# Patient Record
Sex: Female | Born: 1940 | Race: White | Hispanic: No | Smoking: Never smoker
Health system: Southern US, Community
[De-identification: ages and names within clinical notes are randomized; demographics above are authoritative.]

## PROBLEM LIST (undated history)

## (undated) DIAGNOSIS — K219 Gastro-esophageal reflux disease without esophagitis: Secondary | ICD-10-CM

## (undated) DIAGNOSIS — C801 Malignant (primary) neoplasm, unspecified: Secondary | ICD-10-CM

## (undated) DIAGNOSIS — R32 Unspecified urinary incontinence: Secondary | ICD-10-CM

## (undated) DIAGNOSIS — I10 Essential (primary) hypertension: Secondary | ICD-10-CM

## (undated) DIAGNOSIS — E039 Hypothyroidism, unspecified: Secondary | ICD-10-CM

## (undated) DIAGNOSIS — N189 Chronic kidney disease, unspecified: Secondary | ICD-10-CM

## (undated) DIAGNOSIS — K5900 Constipation, unspecified: Secondary | ICD-10-CM

## (undated) DIAGNOSIS — M1990 Unspecified osteoarthritis, unspecified site: Secondary | ICD-10-CM

## (undated) DIAGNOSIS — M810 Age-related osteoporosis without current pathological fracture: Secondary | ICD-10-CM

## (undated) DIAGNOSIS — F419 Anxiety disorder, unspecified: Secondary | ICD-10-CM

## (undated) DIAGNOSIS — G4730 Sleep apnea, unspecified: Secondary | ICD-10-CM

## (undated) HISTORY — PX: JOINT REPLACEMENT: SHX530

## (undated) HISTORY — PX: REPLACEMENT TOTAL KNEE: SUR1224

## (undated) HISTORY — PX: CORRECTION HAMMER TOE: SUR317

## (undated) HISTORY — PX: CATARACT EXTRACTION, BILATERAL: SHX1313

## (undated) HISTORY — PX: BREAST BIOPSY: SHX20

## (undated) HISTORY — PX: BREAST EXCISIONAL BIOPSY: SUR124

## (undated) HISTORY — PX: SKIN BIOPSY: SHX1

---

## 2004-08-24 ENCOUNTER — Encounter: Payer: Self-pay | Admitting: Rheumatology

## 2004-12-30 ENCOUNTER — Other Ambulatory Visit: Payer: Self-pay

## 2004-12-30 ENCOUNTER — Inpatient Hospital Stay: Payer: Self-pay | Admitting: Internal Medicine

## 2005-03-05 ENCOUNTER — Inpatient Hospital Stay: Payer: Self-pay | Admitting: Internal Medicine

## 2005-04-10 ENCOUNTER — Ambulatory Visit: Payer: Self-pay | Admitting: Internal Medicine

## 2005-12-14 ENCOUNTER — Emergency Department: Payer: Self-pay | Admitting: Emergency Medicine

## 2005-12-14 ENCOUNTER — Emergency Department: Payer: Self-pay | Admitting: Internal Medicine

## 2005-12-14 ENCOUNTER — Other Ambulatory Visit: Payer: Self-pay

## 2005-12-15 ENCOUNTER — Emergency Department: Payer: Self-pay | Admitting: Internal Medicine

## 2006-05-12 ENCOUNTER — Emergency Department: Payer: Self-pay | Admitting: Emergency Medicine

## 2006-05-12 ENCOUNTER — Other Ambulatory Visit: Payer: Self-pay

## 2006-07-07 ENCOUNTER — Ambulatory Visit: Payer: Self-pay | Admitting: Internal Medicine

## 2006-08-07 ENCOUNTER — Ambulatory Visit: Payer: Self-pay | Admitting: Surgery

## 2007-02-17 ENCOUNTER — Other Ambulatory Visit: Payer: Self-pay

## 2007-02-17 ENCOUNTER — Inpatient Hospital Stay: Payer: Self-pay | Admitting: Internal Medicine

## 2007-03-08 ENCOUNTER — Ambulatory Visit: Payer: Self-pay | Admitting: Internal Medicine

## 2007-03-31 ENCOUNTER — Other Ambulatory Visit: Payer: Self-pay

## 2007-03-31 ENCOUNTER — Emergency Department: Payer: Self-pay | Admitting: Emergency Medicine

## 2008-01-19 ENCOUNTER — Ambulatory Visit: Payer: Self-pay | Admitting: Internal Medicine

## 2008-02-20 ENCOUNTER — Emergency Department: Payer: Self-pay | Admitting: Emergency Medicine

## 2008-12-26 ENCOUNTER — Ambulatory Visit: Payer: Self-pay | Admitting: Rheumatology

## 2009-01-11 ENCOUNTER — Emergency Department: Payer: Self-pay | Admitting: Emergency Medicine

## 2009-02-14 ENCOUNTER — Ambulatory Visit: Payer: Self-pay | Admitting: Internal Medicine

## 2009-08-17 ENCOUNTER — Ambulatory Visit: Payer: Self-pay | Admitting: Surgery

## 2009-09-03 ENCOUNTER — Ambulatory Visit: Payer: Self-pay | Admitting: Surgery

## 2009-10-19 ENCOUNTER — Ambulatory Visit: Payer: Self-pay | Admitting: Surgery

## 2009-10-26 ENCOUNTER — Ambulatory Visit: Payer: Self-pay | Admitting: Surgery

## 2010-01-15 ENCOUNTER — Emergency Department: Payer: Self-pay | Admitting: Emergency Medicine

## 2010-02-22 ENCOUNTER — Ambulatory Visit: Payer: Self-pay | Admitting: Internal Medicine

## 2010-07-27 ENCOUNTER — Emergency Department: Payer: Self-pay | Admitting: Emergency Medicine

## 2010-08-05 ENCOUNTER — Emergency Department: Payer: Self-pay | Admitting: Internal Medicine

## 2010-10-03 ENCOUNTER — Ambulatory Visit: Payer: Self-pay | Admitting: Internal Medicine

## 2011-08-26 ENCOUNTER — Ambulatory Visit: Payer: Self-pay | Admitting: Otolaryngology

## 2011-11-03 ENCOUNTER — Ambulatory Visit: Payer: Self-pay | Admitting: Internal Medicine

## 2012-08-12 ENCOUNTER — Observation Stay: Payer: Self-pay | Admitting: Internal Medicine

## 2012-08-12 LAB — COMPREHENSIVE METABOLIC PANEL
Albumin: 3.5 g/dL (ref 3.4–5.0)
Alkaline Phosphatase: 78 U/L (ref 50–136)
Anion Gap: 6 — ABNORMAL LOW (ref 7–16)
BUN: 14 mg/dL (ref 7–18)
Bilirubin,Total: 0.3 mg/dL (ref 0.2–1.0)
Calcium, Total: 9.9 mg/dL (ref 8.5–10.1)
Chloride: 103 mmol/L (ref 98–107)
Co2: 32 mmol/L (ref 21–32)
Creatinine: 0.88 mg/dL (ref 0.60–1.30)
EGFR (African American): >60
EGFR (Non-African Amer.): >60
Glucose: 91 mg/dL (ref 65–99)
Osmolality: 281 (ref 275–301)
Potassium: 4.3 mmol/L (ref 3.5–5.1)
SGOT(AST): 28 U/L (ref 15–37)
SGPT (ALT): 24 U/L (ref 12–78)
Sodium: 141 mmol/L (ref 136–145)
Total Protein: 7 g/dL (ref 6.4–8.2)

## 2012-08-12 LAB — CK TOTAL AND CKMB (NOT AT ARMC)
CK, Total: 39 U/L (ref 21–215)
CK, Total: 31 U/L (ref 21–215)
CK-MB: <0.5 ng/mL — ABNORMAL LOW (ref 0.5–3.6)
CK-MB: 0.5 ng/mL (ref 0.5–3.6)

## 2012-08-12 LAB — APTT: Activated PTT: 30.1 secs (ref 23.6–35.9)

## 2012-08-12 LAB — TROPONIN I
Troponin-I: <0.02 ng/mL
Troponin-I: <0.02 ng/mL

## 2012-08-12 LAB — CBC
HCT: 39.9 % (ref 35.0–47.0)
HGB: 13.3 g/dL (ref 12.0–16.0)
MCH: 30.8 pg (ref 26.0–34.0)
MCHC: 33.4 g/dL (ref 32.0–36.0)
MCV: 92 fL (ref 80–100)
Platelet: 246 10*3/uL (ref 150–440)
RBC: 4.33 10*6/uL (ref 3.80–5.20)
RDW: 14.8 % — ABNORMAL HIGH (ref 11.5–14.5)
WBC: 8.5 10*3/uL (ref 3.6–11.0)

## 2012-08-12 LAB — PROTIME-INR
INR: 1
Prothrombin Time: 13.3 secs (ref 11.5–14.7)

## 2012-08-13 DIAGNOSIS — R079 Chest pain, unspecified: Secondary | ICD-10-CM

## 2012-08-13 LAB — TROPONIN I: Troponin-I: <0.02 ng/mL

## 2012-08-13 LAB — CK TOTAL AND CKMB (NOT AT ARMC)
CK, Total: 25 U/L (ref 21–215)
CK-MB: <0.5 ng/mL — ABNORMAL LOW (ref 0.5–3.6)

## 2012-11-22 ENCOUNTER — Ambulatory Visit: Payer: Self-pay | Admitting: Internal Medicine

## 2012-12-18 ENCOUNTER — Emergency Department: Payer: Self-pay | Admitting: Emergency Medicine

## 2012-12-18 LAB — COMPREHENSIVE METABOLIC PANEL
Albumin: 4.1 g/dL (ref 3.4–5.0)
Alkaline Phosphatase: 92 U/L (ref 50–136)
Anion Gap: 10 (ref 7–16)
BUN: 12 mg/dL (ref 7–18)
Bilirubin,Total: 0.7 mg/dL (ref 0.2–1.0)
Calcium, Total: 9.9 mg/dL (ref 8.5–10.1)
Chloride: 106 mmol/L (ref 98–107)
Co2: 24 mmol/L (ref 21–32)
Creatinine: 0.91 mg/dL (ref 0.60–1.30)
EGFR (African American): >60
EGFR (Non-African Amer.): >60
Glucose: 91 mg/dL (ref 65–99)
Osmolality: 279 (ref 275–301)
Potassium: 3.7 mmol/L (ref 3.5–5.1)
SGOT(AST): 32 U/L (ref 15–37)
SGPT (ALT): 33 U/L (ref 12–78)
Sodium: 140 mmol/L (ref 136–145)
Total Protein: 7.7 g/dL (ref 6.4–8.2)

## 2012-12-18 LAB — URINALYSIS, COMPLETE
Bacteria: NONE SEEN
Bilirubin,UR: NEGATIVE
Glucose,UR: NEGATIVE mg/dL (ref 0–75)
Nitrite: NEGATIVE
Ph: 7 (ref 4.5–8.0)
Protein: NEGATIVE
RBC,UR: 2 /HPF (ref 0–5)
Specific Gravity: 1.01 (ref 1.003–1.030)
Squamous Epithelial: 1
WBC UR: 4 /HPF (ref 0–5)

## 2012-12-18 LAB — CBC
HCT: 42 % (ref 35.0–47.0)
HGB: 13.5 g/dL (ref 12.0–16.0)
MCH: 29 pg (ref 26.0–34.0)
MCHC: 32.2 g/dL (ref 32.0–36.0)
MCV: 90 fL (ref 80–100)
Platelet: 279 10*3/uL (ref 150–440)
RBC: 4.67 10*6/uL (ref 3.80–5.20)
RDW: 14.7 % — ABNORMAL HIGH (ref 11.5–14.5)
WBC: 11 10*3/uL (ref 3.6–11.0)

## 2012-12-18 LAB — RAPID INFLUENZA A&B ANTIGENS

## 2013-11-25 ENCOUNTER — Ambulatory Visit: Payer: Self-pay | Admitting: Internal Medicine

## 2013-12-09 ENCOUNTER — Ambulatory Visit: Payer: Self-pay | Admitting: Internal Medicine

## 2013-12-12 LAB — PATHOLOGY REPORT

## 2014-05-15 DIAGNOSIS — M1990 Unspecified osteoarthritis, unspecified site: Secondary | ICD-10-CM | POA: Insufficient documentation

## 2014-07-07 DIAGNOSIS — N289 Disorder of kidney and ureter, unspecified: Secondary | ICD-10-CM | POA: Insufficient documentation

## 2014-07-07 DIAGNOSIS — G4730 Sleep apnea, unspecified: Secondary | ICD-10-CM | POA: Insufficient documentation

## 2014-07-07 DIAGNOSIS — N182 Chronic kidney disease, stage 2 (mild): Secondary | ICD-10-CM | POA: Insufficient documentation

## 2014-07-07 DIAGNOSIS — E785 Hyperlipidemia, unspecified: Secondary | ICD-10-CM | POA: Insufficient documentation

## 2014-07-25 ENCOUNTER — Ambulatory Visit: Payer: Self-pay | Admitting: Specialist

## 2014-07-25 LAB — CBC
HCT: 38.8 % (ref 35.0–47.0)
HGB: 13 g/dL (ref 12.0–16.0)
MCH: 30.7 pg (ref 26.0–34.0)
MCHC: 33.6 g/dL (ref 32.0–36.0)
MCV: 92 fL (ref 80–100)
Platelet: 283 10*3/uL (ref 150–440)
RBC: 4.24 10*6/uL (ref 3.80–5.20)
RDW: 14.6 % — ABNORMAL HIGH (ref 11.5–14.5)
WBC: 7.2 10*3/uL (ref 3.6–11.0)

## 2014-07-25 LAB — URINALYSIS, COMPLETE
Bacteria: NONE SEEN
Bilirubin,UR: NEGATIVE
Blood: NEGATIVE
Glucose,UR: NEGATIVE mg/dL (ref 0–75)
Hyaline Cast: 29
Ketone: NEGATIVE
Nitrite: NEGATIVE
Ph: 6 (ref 4.5–8.0)
Protein: 100
RBC,UR: 3 /HPF (ref 0–5)
Specific Gravity: 1.02 (ref 1.003–1.030)
Squamous Epithelial: 4
WBC UR: 14 /HPF (ref 0–5)

## 2014-07-25 LAB — BASIC METABOLIC PANEL
Anion Gap: 3 — ABNORMAL LOW (ref 7–16)
BUN: 12 mg/dL (ref 7–18)
Calcium, Total: 9.4 mg/dL (ref 8.5–10.1)
Chloride: 106 mmol/L (ref 98–107)
Co2: 31 mmol/L (ref 21–32)
Creatinine: 1.08 mg/dL (ref 0.60–1.30)
EGFR (African American): 59 — ABNORMAL LOW
EGFR (Non-African Amer.): 51 — ABNORMAL LOW
Glucose: 95 mg/dL (ref 65–99)
Osmolality: 279 (ref 275–301)
Potassium: 4.1 mmol/L (ref 3.5–5.1)
Sodium: 140 mmol/L (ref 136–145)

## 2014-07-25 LAB — MRSA PCR SCREENING

## 2014-07-25 LAB — PROTIME-INR
INR: 1.1
Prothrombin Time: 13.8 secs (ref 11.5–14.7)

## 2014-08-07 ENCOUNTER — Inpatient Hospital Stay: Payer: Self-pay | Admitting: Specialist

## 2014-08-08 LAB — BASIC METABOLIC PANEL
Anion Gap: 6 — ABNORMAL LOW (ref 7–16)
BUN: 7 mg/dL (ref 7–18)
Calcium, Total: 8.4 mg/dL — ABNORMAL LOW (ref 8.5–10.1)
Chloride: 102 mmol/L (ref 98–107)
Co2: 30 mmol/L (ref 21–32)
Creatinine: 0.88 mg/dL (ref 0.60–1.30)
EGFR (African American): >60
EGFR (Non-African Amer.): >60
Glucose: 103 mg/dL — ABNORMAL HIGH (ref 65–99)
Osmolality: 274 (ref 275–301)
Potassium: 3.4 mmol/L — ABNORMAL LOW (ref 3.5–5.1)
Sodium: 138 mmol/L (ref 136–145)

## 2014-08-08 LAB — CBC WITH DIFFERENTIAL/PLATELET
Basophil #: 0 10*3/uL (ref 0.0–0.1)
Basophil %: 0.4 %
Eosinophil #: 0.2 10*3/uL (ref 0.0–0.7)
Eosinophil %: 2.5 %
HCT: 33.9 % — ABNORMAL LOW (ref 35.0–47.0)
HGB: 10.8 g/dL — ABNORMAL LOW (ref 12.0–16.0)
Lymphocyte #: 1.2 10*3/uL (ref 1.0–3.6)
Lymphocyte %: 14.7 %
MCH: 29.8 pg (ref 26.0–34.0)
MCHC: 32 g/dL (ref 32.0–36.0)
MCV: 93 fL (ref 80–100)
Monocyte #: 0.8 x10 3/mm (ref 0.2–0.9)
Monocyte %: 9.2 %
Neutrophil #: 6.1 10*3/uL (ref 1.4–6.5)
Neutrophil %: 73.2 %
Platelet: 211 10*3/uL (ref 150–440)
RBC: 3.64 10*6/uL — ABNORMAL LOW (ref 3.80–5.20)
RDW: 14.9 % — ABNORMAL HIGH (ref 11.5–14.5)
WBC: 8.3 10*3/uL (ref 3.6–11.0)

## 2014-08-09 ENCOUNTER — Encounter: Payer: Self-pay | Admitting: Internal Medicine

## 2014-08-09 LAB — HEMOGLOBIN: HGB: 11.8 g/dL — ABNORMAL LOW (ref 12.0–16.0)

## 2014-08-24 ENCOUNTER — Encounter: Payer: Self-pay | Admitting: Internal Medicine

## 2014-09-24 ENCOUNTER — Encounter: Payer: Self-pay | Admitting: Internal Medicine

## 2014-10-09 DIAGNOSIS — R269 Unspecified abnormalities of gait and mobility: Secondary | ICD-10-CM | POA: Insufficient documentation

## 2014-10-09 DIAGNOSIS — M25569 Pain in unspecified knee: Secondary | ICD-10-CM | POA: Insufficient documentation

## 2014-10-09 DIAGNOSIS — M25669 Stiffness of unspecified knee, not elsewhere classified: Secondary | ICD-10-CM | POA: Insufficient documentation

## 2014-10-24 ENCOUNTER — Encounter: Payer: Self-pay | Admitting: Internal Medicine

## 2014-11-24 ENCOUNTER — Encounter: Payer: Self-pay | Admitting: Internal Medicine

## 2015-03-13 NOTE — H&P (Signed)
PATIENT NAME:  Erin Sutton, GLANCY MR#:  [id number] DATE OF BIRTH:  24-Jul-1941  DATE OF ADMISSION:  08/12/2012  PRIMARY CARE PHYSICIAN: Dr. Gilford Rile  History obtained from patient, old records reviewed. Chest x-ray and EKG personally reviewed. Case discussed with ER physician.   CHIEF COMPLAINT: Midsternal chest pain.   HISTORY OF PRESENTING ILLNESS: 74 year old Caucasian female patient with hypertension, hypothyroidism and anxiety presents to the Emergency Room complaining of acute onset of chest pain earlier today morning. The pain lasted till the patient got to the Emergency Room, got sublingual nitroglycerin. Patient also had elevated blood pressure of 217/90 on initial check in the Emergency Room. This pain is nonradiating, no aggravating or relieving factors, associated with nausea but no sweating or palpitations or lightheadedness. Patient last had similar episode two years back when she had elevated blood pressure and resolved with controlling her blood pressure. Today her cardiac enzymes are normal. EKG looks unchanged and is being admitted for observation and further work-up of her chest pain.   PAST MEDICAL HISTORY:  1. Hypertension. 2. Hypothyroidism. 3. Anxiety. 4. Nocturnal hypoxia. 5. Renal insufficiency. 6. Osteoporosis. 7. Osteoarthritis.   PAST SURGICAL HISTORY: Breast biopsies.   HOME MEDICATIONS:  1. Aspirin 81 mg oral once a day.  2. Amlodipine 10 mg oral once a day.  3. Coreg 6.25 mg oral twice a day.  4. Centrum Silver 1 tablet orally once a day.  5. Levothyroxine 50 mcg oral once a day.  6. Lorazepam 2 mg oral 3 times a day as needed for anxiety.  7. Nexium 40 mg oral once a day.  8. Potassium chloride 20 mEq oral once a day.  9. Pravastatin 40 mg oral once a day.  10. Sertraline 100 mg oral once a day.  11. Tamsulosin 0.4 mg orally once a day.  12. Torsemide 20 mg oral once a day.  13. Vitamin D3 1000 international units oral once a day.   ALLERGIES:  Amoxicillin, ACE inhibitors, Benicar, codeine and Levaquin.   FAMILY HISTORY: Father died at a young age of myocardial infarction. Mother had an ischemic foot and peripheral vascular disease.   SOCIAL HISTORY: No alcohol. No tobacco. No illicit drugs. Lives alone, is independent with activities of daily living.   REVIEW OF SYSTEMS: Please see history of presenting illness. Rest of the systems reviewed and negative.    PHYSICAL EXAMINATION:  VITAL SIGNS: Temperature 98.5, blood pressure 217/90, pulse 80, breathing 16 per minute, saturating 97% on room air.   GENERAL: Moderately built, elderly Caucasian female patient lying in bed, comfortable, conversational, cooperative with exam.   PSYCHIATRIC: Alert, oriented x3. Mood and affect appropriate. Judgment intact.   HEENT: Atraumatic, normocephalic. Oral mucosa moist and pink. External ears and nose normal. No pallor. No icterus. Pupils bilaterally equal and reactive to light.   NECK: Supple. No thyromegaly. No palpable lymph nodes. Trachea midline. No carotid bruit, JVD.   CARDIOVASCULAR: S1, S2, regular rate and rhythm without any murmurs. Peripheral pulses 2+. No edema.   RESPIRATORY: Normal work of breathing. Clear to auscultation both sides.   GASTROINTESTINAL: Soft abdomen, nontender. Bowel sounds present. No hepatosplenomegaly palpable.   SKIN: Warm and dry. No petechiae, rash, ulcers.   MUSCULOSKELETAL: No joint swelling, redness, effusion of the large joints. Normal muscle tone.   NEUROLOGICAL: Motor strength 5/5 in upper and lower extremities. Sensation to fine touch intact all over. Cranial nerves II through XII intact.   LYMPHATIC: No cervical lymphadenopathy.   LABORATORY, DIAGNOSTIC AND RADIOLOGIC  DATA: Glucose 91, BUN 14, creatinine 0.88, potassium 4.3. AST, ALT, alkaline phosphatase, bilirubin normal. CK 39, CK-MB less than 0.5. Troponin less than 0.02. WBC 8.5, hemoglobin 13.3, INR 1.   EKG shows normal sinus rhythm  with inferior Q waves. Nonspecific ST-T wave changes. No acute changes compared to prior EKG.   Chest x-ray shows no acute abnormalities.   ASSESSMENT AND PLAN:  1. Chest pain in a patient with hypertension and family history of premature coronary artery disease and the pain resolved with nitroglycerin. This could be secondary to her hypertensive urgency. Will get a stress test, two more sets of cardiac enzymes. Patient is on aspirin, beta blocker which will be continued.  2. Hypertensive urgency with chest pain, presently improving. Will continue home medications use IV p.r.n. medications to better control the blood pressure and monitor.  3. Anxiety. Continue lorazepam.  4. Deep venous thrombosis prophylaxis with Lovenox.  5. CODE STATUS: FULL CODE.      TIME SPENT: Time spent today on this case was 45 minutes with more than 50% time spent in coordination of care.   ____________________________ Leia Alf. Gloriann Riede, MD srs:cms D: 08/12/2012 12:38:12 ET T: 08/12/2012 13:27:07 ET JOB#: [id number]  cc: Alveta Heimlich R. Arnoldo Hildreth, MD, <Dictator> John B. Sarina Ser, MD  Alveta Heimlich Arlice Colt MD ELECTRONICALLY SIGNED 08/17/2012 12:16

## 2015-03-17 NOTE — Discharge Summary (Signed)
PATIENT NAME:  Erin Sutton, Erin Sutton MR#:  [id number] DATE OF BIRTH:  1941/05/21  DATE OF ADMISSION:  08/07/2014 DATE OF DISCHARGE:  08/10/2014  FINAL DIAGNOSES:  1.  Advanced osteoarthritis of the left knee. 2.  Osteoporosis. 3.  Renal insufficiency. 4.  Nocturnal hypoxia. 5.  Anxiety.  6.  Hypothyroidism.  7.  Hypertension.   OPERATIONS: On 08/07/2014, cemented DePuy rotating platform LCS total knee replacement.   COMPLICATIONS: None.   CONSULTATIONS: None.   DISCHARGE MEDICATIONS: 1.  Enteric-coated aspirin 1 p.o. b.i.d. for 6 weeks.  2.  Norco 5/325 p.r.n. pain.  3.  Iron 1 p.o. daily. 4.  Neurontin 400 mg b.i.d. 5.  Ativan 2 mg t.i.d. 6.  Protonix 40 mg daily. 7.  Pravachol 40 mg daily. 8.  Zoloft 100 mg at bedtime.  9.  Demadex 20 mg p.r.n. hypertension.  10.  Norvasc 10 mg daily. 11.  Coreg 6.5 mg b.i.d.  12.  Imdur 30 mg daily. 13.  Synthroid 0.05 mg daily.  14.  Potassium chloride 20 mEq daily. 15.  Fosamax 70 mg weekly for 4 weeks.   HISTORY OF PRESENT ILLNESS: The patient is 74 year old female with advanced painful osteoarthritis of the left knee. She has been treated with NSAIDs, injections, bracing, exercise and rest without significant relief. This effects all activities of daily life, interferes with sleep, climbing stairs, shopping, etc. X-rays showed complete medial joint line narrowing and large cyst in the medial tibia. 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 The patient was admitted for left total knee replacement.   PAST MEDICAL HISTORY: Illnesses: As above.   MEDICATIONS: As above.   ALLERGIES: PENICILLIN, AMOXICILLIN, SULFA, CODEINE, TAPE CAUSED A RASH, BENICAR KNOWN, LEVAQUIN ITCHING AND ALTERED MENTAL STATUS.  REVIEW OF SYSTEMS: Otherwise unremarkable.   FAMILY HISTORY: Unremarkable.   SOCIAL HISTORY: The patient lives alone and does not smoke or drink.  REVIEW OF SYSTEMS: Unremarkable.   PHYSICAL EXAMINATION: The patient was alert and  cooperative. The left knee showed motion from 10 to 95 degrees, which is painful. There was joint line tenderness and varus deformity. There was severe patellofemoral crepitus. Neurovascular status was good distally. The knee was stable. The skin was intact.   DIAGNOSTIC DATA: Laboratory data on admission was satisfactory.   HOSPITAL COURSE: On 08/07/2014, the patient underwent cemented DePuy LCS rotating platform total knee replacement. Postoperatively, she did well with no significant medical problems. She had minimal pain. Range of motion improved nicely. The wounds are clean and dry. She is stable and ready for skilled nursing discharge on 08/10/2014. She will be partial weight-bearing on the left leg. She will be seen in my office in 2 weeks for exam.   ____________________________ Park Breed, MD hem:sb D: 08/10/2014 10:13:59 ET T: 08/10/2014 10:34:12 ET JOB#: [id number]  cc: Park Breed, MD, <Dictator> John B. Sarina Ser, MD Park Breed MD ELECTRONICALLY SIGNED 08/10/2014 21:34

## 2015-03-17 NOTE — H&P (Signed)
Subjective/Chief Complaint Left knee pain   History of Present Illness 74 year old female presents with severe left knee pain unrelieved by NSAIDs, injections, bracing, exercise, and rest.  This affects all daily activities and interferes with sleep, bending climbing stairs and shopping. X-rays show complete medial joint line narrowing and a large cyst in themedial tibia.  There is also severe patellofemoral arthrosis. She requests left total knee replacement.  Risks and benefits of surgery were discussed at length including but not limited to infection, non union, nerve or blood vessed damage, non union, need for repeat surgery, blood clots and lung emboli, and death.   Code Status Full Code   Past Med/Surgical Hx:  Osteoporosis:   renal insufficiency:   nocturnal hypoxia:   Anxiety:   Hypothyroidism:   HTN:   ALLERGIES:  PCN: Rash, Other  Amoxicillin: Rash, Other  Sulfa drugs: Rash  Codeine: Rash  Tape: Itching, Rash  Benicar: Unknown  Other- Explain in Comments Line: Unknown  Levaquin: Alt Ment Status, Itching  HOME MEDICATIONS: Medication Instructions Status  amlodipine 10 mg oral tablet 1 tab(s) orally once a day Active  Bayer Aspirin Regimen 81 mg oral delayed release tablet 1 tab(s) orally once a day Active  carvedilol 6.25 mg oral tablet 1 tab(s) orally 2 times a day Active  Centrum Silver oral tablet 1 tab(s) orally once a day Active  levothyroxine 50 mcg (0.05 mg) oral tablet 1 tab(s) orally once a day Active  lorazepam 2 mg oral tablet 1 tab(s) orally 3 times a day, As Needed- for Anxiety, Nervousness  Active  potassium chloride 20 mEq oral tablet, extended release 1 tab(s) orally once a day Active  pravastatin 40 mg oral tablet 1 tab(s) orally once a day (at bedtime) Active  sertraline 100 mg oral tablet 1 tab(s) orally once a day (at bedtime) Active  torsemide 20 mg oral tablet 1 tab(s) orally once a day Active  Protonix 40 mg oral delayed release tablet 1 tab(s)  orally once a day Active  Imdur 30 mg oral tablet, extended release 1 tab(s) orally once a day (in the morning) Active  alendronate weekly 70 milligram(s) orally every 7 days on Sundays Active  Calcium 500+D 1 tab(s) orally once a day calcium 1200 mg and Vitamen D3 1000 IU Active   Family and Social History:  Family History Non-Contributory   Social History negative tobacco, negative ETOH   Place of Living Home   Review of Systems:  Fever/Chills No   Cough No   Sputum No   Abdominal Pain No   Constipation No   Chest Pain No   Physical Exam:  GEN well developed, well nourished, no acute distress   HEENT pink conjunctivae   NECK supple   RESP normal resp effort   CARD regular rate   ABD denies tenderness   LYMPH negative neck   EXTR negative edema, Ledt knee has range of motion 10-95*.  medial joint line tenderness and crepitus.  circulation/sensation/motor function good distally skin normal.   SKIN normal to palpation   NEURO motor/sensory function intact   PSYCH alert, A+O to time, place, person    Assessment/Admission Diagnosis Advanced osteoarthritis left knee   Plan Left Total Knee Arthroplasty   Electronic Signatures: Park Breed (MD)  (Signed 12-Sep-15 13:17)  Authored: CHIEF COMPLAINT and HISTORY, PAST MEDICAL/SURGIAL HISTORY, ALLERGIES, HOME MEDICATIONS, FAMILY AND SOCIAL HISTORY, REVIEW OF SYSTEMS, PHYSICAL EXAM, ASSESSMENT AND PLAN   Last Updated: 12-Sep-15 13:17 by Sabra Heck,  Isidore Moos (MD)

## 2015-03-17 NOTE — Op Note (Signed)
PATIENT NAME:  Erin Sutton, Erin Sutton MR#:  [id number] DATE OF BIRTH:  [birth year]  DATE OF PROCEDURE:  08/07/2014  PREOPERATIVE DIAGNOSIS: Advanced osteoarthritis, left knee with medial tibial cyst.  POSTOPERATIVE DIAGNOSIS: Advanced osteoarthritis, left knee with medial tibial cyst.   OPERATION: Cemented [place] LCS rotating platform total knee replacement (standard femur/patella, number 3 keeled tibia, 10 mm rotating platform polyethylene).   SURGEON: Park Breed, MD.   ANESTHESIA: Spinal.   COMPLICATIONS: None.   DRAINS: Two Autovacs.   ESTIMATED BLOOD LOSS: 50 mL, replaced none.   OPERATIVE PROCEDURE: The patient was brought to the operating room where she underwent satisfactory spinal anesthesia and was placed in the supine position and padded appropriately on the operating room table. The left leg was prepped and draped in sterile fashion and an Esmarch applied. The tourniquet was inflated to 350 mmHg. Tourniquet time was 121 minutes. An anterior midline incision was made and dissection carried out sharply through subcutaneous tissue along with a medial arthrotomy. Soft tissue dissection was carried out and the tibial alignment guide was put in place. The cutting guide was pinned and the proximal tibial cut was made taking more bone laterally than medially. The tibial cyst was visible medially as gelatinous material which was removed with a curette and suction. This was to be filled later with the bone chips. The femur was sized as a standard and a centering hole made. The ligaments were checked and were balanced. The anterior femoral cutting guide was aligned using the rotation guide and was pinned in place. The alignment was very good. The anterior and posterior cuts were made. The 4 degree valgus distal femoral cutting guide was inserted and the distal cuts made. The finishing guides were then introduced and the finishing cuts made. The tibia was exposed and the centering hole made. The  number 3 keeled tibia was inserted. A 10 mm standard polyethylene was inserted along with a standard femoral component. This articulated well and had good range of motion with good alignment and stability. The patella was then cut and drilled. The trial patella was inserted and this tracked well. The trials were all then removed and the joint thoroughly irrigated free of all debris. The final soft tissue dissection was carried out. Bone chips were impacted into the cyst on the medial tibia. All bone surfaces were dried while cement was mixed. The standard keeled tibial component and standard femoral components were cemented in place with a 10 mm polyethylene rotating platform insert. The standard patellar component was cemented in place. Once all excess cement was removed and the cement had hardened the knee was put through motion and was stable. It was again irrigated. Prior to cementing the soft tissues were infiltrated with 0.25% Marcaine with morphine and Toradol. After final irrigation the Autovacs were inserted. The capsule was closed with number 2 Quill. The subcutaneous tissue was closed with 0 Quill. The skin was closed with staples. Dry sterile dressing was applied and the Autovac was activated. The tourniquet was deflated with good return of blood flow to the foot. TENS pads had been applied as well. A Polar Care and knee immobilizer were applied. The patient was transferred to her hospital bed and taken to recovery in good condition.     ____________________________ Park Breed, MD hem:bu D: 08/07/2014 16:22:15 ET T: 08/07/2014 17:18:36 ET JOB#: [id number]  cc: Park Breed, MD, <Dictator> Park Breed MD ELECTRONICALLY SIGNED 08/08/2014 14:48

## 2015-05-21 ENCOUNTER — Other Ambulatory Visit: Payer: Self-pay | Admitting: Nurse Practitioner

## 2015-05-21 DIAGNOSIS — K5900 Constipation, unspecified: Secondary | ICD-10-CM | POA: Insufficient documentation

## 2015-05-21 DIAGNOSIS — R1031 Right lower quadrant pain: Secondary | ICD-10-CM | POA: Insufficient documentation

## 2015-05-21 DIAGNOSIS — R1032 Left lower quadrant pain: Secondary | ICD-10-CM

## 2015-05-21 DIAGNOSIS — R194 Change in bowel habit: Secondary | ICD-10-CM | POA: Insufficient documentation

## 2015-05-25 ENCOUNTER — Ambulatory Visit
Admission: RE | Admit: 2015-05-25 | Discharge: 2015-05-25 | Disposition: A | Discharge status: Home / Self Care | Payer: Medicare PPO | Source: Ambulatory Visit | Attending: Nurse Practitioner | Admitting: Nurse Practitioner

## 2015-05-25 DIAGNOSIS — M47896 Other spondylosis, lumbar region: Secondary | ICD-10-CM | POA: Diagnosis not present

## 2015-05-25 DIAGNOSIS — R140 Abdominal distension (gaseous): Secondary | ICD-10-CM | POA: Insufficient documentation

## 2015-05-25 DIAGNOSIS — R194 Change in bowel habit: Secondary | ICD-10-CM | POA: Insufficient documentation

## 2015-05-25 DIAGNOSIS — R1031 Right lower quadrant pain: Secondary | ICD-10-CM

## 2015-05-25 DIAGNOSIS — D259 Leiomyoma of uterus, unspecified: Secondary | ICD-10-CM | POA: Insufficient documentation

## 2015-05-25 DIAGNOSIS — K5730 Diverticulosis of large intestine without perforation or abscess without bleeding: Secondary | ICD-10-CM | POA: Insufficient documentation

## 2015-05-25 DIAGNOSIS — R1030 Lower abdominal pain, unspecified: Secondary | ICD-10-CM | POA: Diagnosis present

## 2015-05-25 DIAGNOSIS — R1032 Left lower quadrant pain: Secondary | ICD-10-CM

## 2015-05-30 ENCOUNTER — Other Ambulatory Visit: Payer: Self-pay | Admitting: Internal Medicine

## 2015-05-30 DIAGNOSIS — R102 Pelvic and perineal pain: Secondary | ICD-10-CM

## 2015-06-01 ENCOUNTER — Ambulatory Visit: Payer: Medicare HMO

## 2015-06-04 ENCOUNTER — Ambulatory Visit
Admission: RE | Admit: 2015-06-04 | Discharge: 2015-06-04 | Disposition: A | Discharge status: Home / Self Care | Payer: Medicare PPO | Source: Ambulatory Visit | Attending: Internal Medicine | Admitting: Internal Medicine

## 2015-06-04 DIAGNOSIS — R102 Pelvic and perineal pain: Secondary | ICD-10-CM | POA: Diagnosis present

## 2015-06-04 DIAGNOSIS — G8929 Other chronic pain: Secondary | ICD-10-CM | POA: Diagnosis present

## 2015-06-04 DIAGNOSIS — D259 Leiomyoma of uterus, unspecified: Secondary | ICD-10-CM | POA: Insufficient documentation

## 2015-06-29 ENCOUNTER — Encounter: Payer: Self-pay | Admitting: *Deleted

## 2015-07-02 ENCOUNTER — Ambulatory Visit
Admission: RE | Admit: 2015-07-02 | Discharge: 2015-07-02 | Disposition: A | Discharge status: Home / Self Care | Payer: Medicare PPO | Source: Ambulatory Visit | Attending: Unknown Physician Specialty | Admitting: Unknown Physician Specialty

## 2015-07-02 ENCOUNTER — Ambulatory Visit: Payer: Medicare PPO | Admitting: Anesthesiology

## 2015-07-02 ENCOUNTER — Encounter
Admission: RE | Disposition: A | Discharge status: Home / Self Care | Payer: Self-pay | Source: Ambulatory Visit | Attending: Unknown Physician Specialty

## 2015-07-02 DIAGNOSIS — K5730 Diverticulosis of large intestine without perforation or abscess without bleeding: Secondary | ICD-10-CM | POA: Insufficient documentation

## 2015-07-02 DIAGNOSIS — N189 Chronic kidney disease, unspecified: Secondary | ICD-10-CM | POA: Insufficient documentation

## 2015-07-02 DIAGNOSIS — I129 Hypertensive chronic kidney disease with stage 1 through stage 4 chronic kidney disease, or unspecified chronic kidney disease: Secondary | ICD-10-CM | POA: Insufficient documentation

## 2015-07-02 DIAGNOSIS — K648 Other hemorrhoids: Secondary | ICD-10-CM | POA: Diagnosis not present

## 2015-07-02 DIAGNOSIS — K5900 Constipation, unspecified: Secondary | ICD-10-CM | POA: Diagnosis present

## 2015-07-02 DIAGNOSIS — E039 Hypothyroidism, unspecified: Secondary | ICD-10-CM | POA: Insufficient documentation

## 2015-07-02 DIAGNOSIS — D120 Benign neoplasm of cecum: Secondary | ICD-10-CM | POA: Insufficient documentation

## 2015-07-02 DIAGNOSIS — K219 Gastro-esophageal reflux disease without esophagitis: Secondary | ICD-10-CM | POA: Insufficient documentation

## 2015-07-02 DIAGNOSIS — D123 Benign neoplasm of transverse colon: Secondary | ICD-10-CM | POA: Diagnosis not present

## 2015-07-02 HISTORY — DX: Gastro-esophageal reflux disease without esophagitis: K21.9

## 2015-07-02 HISTORY — DX: Essential (primary) hypertension: I10

## 2015-07-02 HISTORY — DX: Constipation, unspecified: K59.00

## 2015-07-02 HISTORY — PX: COLONOSCOPY WITH PROPOFOL: SHX5780

## 2015-07-02 HISTORY — DX: Anxiety disorder, unspecified: F41.9

## 2015-07-02 HISTORY — PX: ESOPHAGOGASTRODUODENOSCOPY (EGD) WITH PROPOFOL: SHX5813

## 2015-07-02 HISTORY — DX: Unspecified osteoarthritis, unspecified site: M19.90

## 2015-07-02 HISTORY — DX: Sleep apnea, unspecified: G47.30

## 2015-07-02 HISTORY — DX: Hypothyroidism, unspecified: E03.9

## 2015-07-02 HISTORY — DX: Chronic kidney disease, unspecified: N18.9

## 2015-07-02 SURGERY — ESOPHAGOGASTRODUODENOSCOPY (EGD) WITH PROPOFOL
Anesthesia: General

## 2015-07-02 MED ORDER — PHENYLEPHRINE HCL 10 MG/ML IJ SOLN
INTRAMUSCULAR | Status: DC | PRN
  Administered 2015-07-02: 100 ug via INTRAVENOUS

## 2015-07-02 MED ORDER — LIDOCAINE HCL (PF) 2 % IJ SOLN
INTRAMUSCULAR | Status: DC | PRN
  Administered 2015-07-02: 50 mg

## 2015-07-02 MED ORDER — SODIUM CHLORIDE 0.9 % IV SOLN
INTRAVENOUS | Status: DC
  Administered 2015-07-02: 1000 mL via INTRAVENOUS

## 2015-07-02 MED ORDER — SODIUM CHLORIDE 0.9 % IV SOLN: INTRAVENOUS | Status: DC

## 2015-07-02 MED ORDER — VANCOMYCIN HCL IN DEXTROSE 750-5 MG/150ML-% IV SOLN
750.0000 mg | Freq: Once | INTRAVENOUS | Status: AC
  Administered 2015-07-02: 750 mg via INTRAVENOUS
  Filled 2015-07-02: qty 150

## 2015-07-02 MED ORDER — FENTANYL CITRATE (PF) 100 MCG/2ML IJ SOLN
INTRAMUSCULAR | Status: DC | PRN
  Administered 2015-07-02: 50 ug via INTRAVENOUS

## 2015-07-02 MED ORDER — GENTAMICIN IN SALINE 1.6-0.9 MG/ML-% IV SOLN
80.0000 mg | Freq: Once | INTRAVENOUS | Status: AC
  Administered 2015-07-02: 80 mg via INTRAVENOUS
  Filled 2015-07-02: qty 50

## 2015-07-02 MED ORDER — ESMOLOL HCL 10 MG/ML IV SOLN
INTRAVENOUS | Status: DC | PRN
  Administered 2015-07-02: 20 mg via INTRAVENOUS

## 2015-07-02 MED ORDER — PROPOFOL 10 MG/ML IV BOLUS
INTRAVENOUS | Status: DC | PRN
  Administered 2015-07-02: 30 mg via INTRAVENOUS
  Administered 2015-07-02: 20 mg via INTRAVENOUS

## 2015-07-02 MED ORDER — MIDAZOLAM HCL 5 MG/5ML IJ SOLN
INTRAMUSCULAR | Status: DC | PRN
  Administered 2015-07-02: 1 mg via INTRAVENOUS

## 2015-07-02 MED ORDER — PROPOFOL INFUSION 10 MG/ML OPTIME
INTRAVENOUS | Status: DC | PRN
  Administered 2015-07-02: 100 ug/kg/min via INTRAVENOUS

## 2015-07-02 NOTE — Anesthesia Preprocedure Evaluation (Signed)
Anesthesia Evaluation  Patient identified by MRN, date of birth, ID band Patient awake    Reviewed: Allergy & Precautions, H&P , NPO status , Patient's Chart, lab work & pertinent test results, reviewed documented beta blocker date and time   History of Anesthesia Complications Negative for: history of anesthetic complications  Airway Mallampati: II  TM Distance: >3 FB Neck ROM: full    Dental no notable dental hx. (+) Missing   Pulmonary sleep apnea , neg COPDneg recent URI,  breath sounds clear to auscultation  Pulmonary exam normal       Cardiovascular Exercise Tolerance: Good hypertension, On Home Beta Blockers and On Medications - angina+CHF - CAD, - Past MI and - CABG Normal cardiovascular exam- Valvular Problems/MurmursRhythm:regular Rate:Normal     Neuro/Psych Seizures - (1 episode during acute intensive care admission.  None since.),  negative psych ROS   GI/Hepatic Neg liver ROS, GERD-  Medicated and Poorly Controlled,  Endo/Other  neg diabetesHypothyroidism   Renal/GU CRFRenal disease  negative genitourinary   Musculoskeletal   Abdominal   Peds  Hematology negative hematology ROS (+)   Anesthesia Other Findings Past Medical History:   Hypertension                                                 Anxiety                                                      Sleep apnea                                                  Chronic kidney disease                                       Arthritis                                                    Hypothyroidism                                               GERD (gastroesophageal reflux disease)                       Constipation                                                 Reproductive/Obstetrics negative OB ROS  Anesthesia Physical Anesthesia Plan  ASA: III  Anesthesia Plan: General   Post-op Pain  Management:    Induction:   Airway Management Planned:   Additional Equipment:   Intra-op Plan:   Post-operative Plan:   Informed Consent: I have reviewed the patients History and Physical, chart, labs and discussed the procedure including the risks, benefits and alternatives for the proposed anesthesia with the patient or authorized representative who has indicated his/her understanding and acceptance.   Dental Advisory Given  Plan Discussed with: Anesthesiologist, CRNA and Surgeon  Anesthesia Plan Comments:         Anesthesia Quick Evaluation

## 2015-07-02 NOTE — Anesthesia Postprocedure Evaluation (Signed)
  Anesthesia Post-op Note  Patient: Erin Sutton  Procedure(s) Performed: Procedure(s): ESOPHAGOGASTRODUODENOSCOPY (EGD) WITH PROPOFOL (N/A) COLONOSCOPY WITH PROPOFOL (N/A)  Anesthesia type:General  Patient location: PACU  Post pain: Pain level controlled  Post assessment: Post-op Vital signs reviewed, Patient's Cardiovascular Status Stable, Respiratory Function Stable, Patent Airway and No signs of Nausea or vomiting  Post vital signs: Reviewed and stable  Last Vitals:  Filed Vitals:   07/02/15 1210  BP: 158/77  Pulse: 85  Temp:   Resp: 16    Level of consciousness: awake, alert  and patient cooperative  Complications: No apparent anesthesia complications

## 2015-07-02 NOTE — Op Note (Signed)
[facility] Patient Name: Erin Sutton Procedure Date: 07/02/2015 10:49 AM MRN: [id number] Account #: [id number] Date of Birth: 06-Feb-1941 Admit Type: Outpatient Age: 74 Room: [facility] ENDO ROOM 1 Gender: Female Note Status: Finalized Procedure:         Upper GI endoscopy Indications:       Heartburn, Suspected gastro-esophageal reflux disease Providers:         Manya Silvas, MD Referring MD:      Hewitt Blade. Sarina Ser, MD (Referring MD) Medicines:         Propofol per Anesthesia Complications:     No immediate complications. Procedure:         Pre-Anesthesia Assessment:                    - After reviewing the risks and benefits, the patient was                     deemed in satisfactory condition to undergo the procedure.                    After obtaining informed consent, the endoscope was passed                     under direct vision. Throughout the procedure, the                     patient's blood pressure, pulse, and oxygen saturations                     were monitored continuously. The Olympus GIF-160 endoscope                     (S#. [id number]) was introduced through the mouth, and                     advanced to the second part of duodenum. The upper GI                     endoscopy was accomplished without difficulty. The patient                     tolerated the procedure well. Findings:      The examined esophagus was normal. GEJ 35-36cm.      A very small hiatus hernia was present.      Diffuse mildly erythematous mucosa without bleeding was found in the       gastric body and in the gastric antrum. Biopsies were taken with a cold       forceps for histology.      The examined duodenum was normal. Impression:        - Normal esophagus.                    - Small hiatus hernia.                    - Erythematous mucosa in the gastric body and antrum.                     Biopsied.                    - Normal examined  duodenum. Recommendation:    - Await pathology results.                    -  Perform a colonoscopy as previously scheduled. Manya Silvas, MD 07/02/2015 11:12:51 AM This report has been signed electronically. Number of Addenda: 0 Note Initiated On: 07/02/2015 10:49 AM      [facility]

## 2015-07-02 NOTE — Transfer of Care (Signed)
Immediate Anesthesia Transfer of Care Note  Patient: Erin Sutton  Procedure(s) Performed: Procedure(s): ESOPHAGOGASTRODUODENOSCOPY (EGD) WITH PROPOFOL (N/A) COLONOSCOPY WITH PROPOFOL (N/A)  Patient Location: PACU  Anesthesia Type:General  Level of Consciousness: sedated  Airway & Oxygen Therapy: Patient Spontanous Breathing and Patient connected to nasal cannula oxygen  Post-op Assessment: Report given to RN and Post -op Vital signs reviewed and stable  Post vital signs: Reviewed and stable  Last Vitals:  Filed Vitals:   07/02/15 0914  BP: 162/88  Pulse: 81  Temp: 37 C  Resp: 18    Complications: No apparent anesthesia complications

## 2015-07-02 NOTE — H&P (Signed)
Primary Care Physician:  Madelyn Brunner, MD Primary Gastroenterologist:  Dr. Vira Agar  Pre-Procedure History & Physical: HPI:  Erin Sutton is a 74 y.o. female is here for an endoscopy and colonoscopy.   Past Medical History  Diagnosis Date  . Hypertension   . Anxiety   . Sleep apnea   . Chronic kidney disease   . Arthritis   . Hypothyroidism   . GERD (gastroesophageal reflux disease)   . Constipation     Past Surgical History  Procedure Laterality Date  . Joint replacement Left     knee  . Breast biopsy    . Correction hammer toe Right     Prior to Admission medications   Medication Sig Start Date End Date Taking? Authorizing Provider  alendronate (FOSAMAX) 70 MG tablet Take 70 mg by mouth once a week. Take with a full glass of water on an empty stomach.   Yes Historical Provider, MD  amLODipine (NORVASC) 10 MG tablet Take 10 mg by mouth daily.   Yes Historical Provider, MD  aspirin EC 81 MG tablet Take 81 mg by mouth daily.   Yes Historical Provider, MD  carvedilol (COREG) 6.25 MG tablet Take 6.25 mg by mouth 2 (two) times daily with a meal.   Yes Historical Provider, MD  Cholecalciferol (VITAMIN D-3) 1000 UNITS CAPS Take 1,000 Units by mouth once.   Yes Historical Provider, MD  isosorbide mononitrate (IMDUR) 30 MG 24 hr tablet Take 30 mg by mouth daily.   Yes Historical Provider, MD  levothyroxine (SYNTHROID, LEVOTHROID) 50 MCG tablet Take 50 mcg by mouth daily before breakfast.   Yes Historical Provider, MD  LORazepam (ATIVAN) 2 MG tablet Take 2 mg by mouth every 6 (six) hours as needed for anxiety.   Yes Historical Provider, MD  Multiple Vitamin-Folic Acid TABS Take by mouth every morning.   Yes Historical Provider, MD  pantoprazole (PROTONIX) 40 MG tablet Take 40 mg by mouth daily.   Yes Historical Provider, MD  polyethylene glycol-electrolytes (NULYTELY/GOLYTELY) 420 G solution Take 4,000 mLs by mouth once.   Yes Historical Provider, MD  potassium chloride SA  (K-DUR,KLOR-CON) 20 MEQ tablet Take 20 mEq by mouth every morning.   Yes Historical Provider, MD  pravastatin (PRAVACHOL) 40 MG tablet Take 40 mg by mouth daily.   Yes Historical Provider, MD  sertraline (ZOLOFT) 100 MG tablet Take 100 mg by mouth daily.   Yes Historical Provider, MD  torsemide (DEMADEX) 20 MG tablet Take 20 mg by mouth daily.   Yes Historical Provider, MD    Allergies as of 05/24/2015  . (Not on File)    History reviewed. No pertinent family history.  History   Social History  . Marital Status: Widowed    Spouse Name: N/A  . Number of Children: N/A  . Years of Education: N/A   Occupational History  . Not on file.   Social History Main Topics  . Smoking status: Never Smoker   . Smokeless tobacco: Never Used  . Alcohol Use: No  . Drug Use: No  . Sexual Activity: Not on file   Other Topics Concern  . Not on file   Social History Narrative    Review of Systems: See HPI, otherwise negative ROS  Physical Exam: BP 162/88 mmHg  Pulse 81  Temp(Src) 98.6 F (37 C) (Tympanic)  Resp 18  Ht 5' (1.524 m)  Wt 84.823 kg (187 lb)  BMI 36.52 kg/m2  SpO2 99% General:  Alert,  pleasant and cooperative in NAD Head:  Normocephalic and atraumatic. Neck:  Supple; no masses or thyromegaly. Lungs:  Clear throughout to auscultation.    Heart:  Regular rate and rhythm. Abdomen:  Soft, nontender and nondistended. Normal bowel sounds, without guarding, and without rebound.   Neurologic:  Alert and  oriented x4;  grossly normal neurologically.  Impression/Plan: Erin Sutton is here for an endoscopy and colonoscopy to be performed for GERD, change in bowel habits  Risks, benefits, limitations, and alternatives regarding  endoscopy and colonoscopy have been reviewed with the patient.  Questions have been answered.  All parties agreeable.   Gaylyn Cheers, MD  07/02/2015, 10:54 AM   Primary Care Physician:  Madelyn Brunner, MD Primary Gastroenterologist:  Dr.  Vira Agar  Pre-Procedure History & Physical: HPI:  Erin Sutton is a 74 y.o. female is here for an endoscopy and colonoscopy.   Past Medical History  Diagnosis Date  . Hypertension   . Anxiety   . Sleep apnea   . Chronic kidney disease   . Arthritis   . Hypothyroidism   . GERD (gastroesophageal reflux disease)   . Constipation     Past Surgical History  Procedure Laterality Date  . Joint replacement Left     knee  . Breast biopsy    . Correction hammer toe Right     Prior to Admission medications   Medication Sig Start Date End Date Taking? Authorizing Provider  alendronate (FOSAMAX) 70 MG tablet Take 70 mg by mouth once a week. Take with a full glass of water on an empty stomach.   Yes Historical Provider, MD  amLODipine (NORVASC) 10 MG tablet Take 10 mg by mouth daily.   Yes Historical Provider, MD  aspirin EC 81 MG tablet Take 81 mg by mouth daily.   Yes Historical Provider, MD  carvedilol (COREG) 6.25 MG tablet Take 6.25 mg by mouth 2 (two) times daily with a meal.   Yes Historical Provider, MD  Cholecalciferol (VITAMIN D-3) 1000 UNITS CAPS Take 1,000 Units by mouth once.   Yes Historical Provider, MD  isosorbide mononitrate (IMDUR) 30 MG 24 hr tablet Take 30 mg by mouth daily.   Yes Historical Provider, MD  levothyroxine (SYNTHROID, LEVOTHROID) 50 MCG tablet Take 50 mcg by mouth daily before breakfast.   Yes Historical Provider, MD  LORazepam (ATIVAN) 2 MG tablet Take 2 mg by mouth every 6 (six) hours as needed for anxiety.   Yes Historical Provider, MD  Multiple Vitamin-Folic Acid TABS Take by mouth every morning.   Yes Historical Provider, MD  pantoprazole (PROTONIX) 40 MG tablet Take 40 mg by mouth daily.   Yes Historical Provider, MD  polyethylene glycol-electrolytes (NULYTELY/GOLYTELY) 420 G solution Take 4,000 mLs by mouth once.   Yes Historical Provider, MD  potassium chloride SA (K-DUR,KLOR-CON) 20 MEQ tablet Take 20 mEq by mouth every morning.   Yes Historical  Provider, MD  pravastatin (PRAVACHOL) 40 MG tablet Take 40 mg by mouth daily.   Yes Historical Provider, MD  sertraline (ZOLOFT) 100 MG tablet Take 100 mg by mouth daily.   Yes Historical Provider, MD  torsemide (DEMADEX) 20 MG tablet Take 20 mg by mouth daily.   Yes Historical Provider, MD    Allergies as of 05/24/2015  . (Not on File)    History reviewed. No pertinent family history.  History   Social History  . Marital Status: Widowed    Spouse Name: N/A  . Number  of Children: N/A  . Years of Education: N/A   Occupational History  . Not on file.   Social History Main Topics  . Smoking status: Never Smoker   . Smokeless tobacco: Never Used  . Alcohol Use: No  . Drug Use: No  . Sexual Activity: Not on file   Other Topics Concern  . Not on file   Social History Narrative    Review of Systems: See HPI, otherwise negative ROS  Physical Exam: BP 162/88 mmHg  Pulse 81  Temp(Src) 98.6 F (37 C) (Tympanic)  Resp 18  Ht 5' (1.524 m)  Wt 84.823 kg (187 lb)  BMI 36.52 kg/m2  SpO2 99% General:   Alert,  pleasant and cooperative in NAD Head:  Normocephalic and atraumatic. Neck:  Supple; no masses or thyromegaly. Lungs:  Clear throughout to auscultation.    Heart:  Regular rate and rhythm. Abdomen:  Soft, nontender and nondistended. Normal bowel sounds, without guarding, and without rebound.   Neurologic:  Alert and  oriented x4;  grossly normal neurologically.  Impression/Plan: Erin Sutton is here for an endoscopy and colonoscopy to be performed for GERD, change in bowel habits  Risks, benefits, limitations, and alternatives regarding  endoscopy and colonoscopy have been reviewed with the patient.  Questions have been answered.  All parties agreeable.   Gaylyn Cheers, MD  07/02/2015, 10:54 AM

## 2015-07-02 NOTE — Op Note (Signed)
[facility]astroenter[facility] Patient Name: Erin Sutton Procedure Date: 07/02/2015 10:39 AM MRN: [id number] Account #: [id number] Date of Birth: [birth year] Admit Type: Outpatient Age: 74 Room: [facility] ENDO ROOM 1 Gender: Female Note Status: Finalized Procedure:         Colonoscopy Indications:       Change in bowel habits, Constipation Providers:         Manya Silvas, MD Referring MD:      Hewitt Blade. Sarina Ser, MD (Referring MD) Medicines:         Propofol per Anesthesia Complications:     No immediate complications. Procedure:         Pre-Anesthesia Assessment:                    - After reviewing the risks and benefits, the patient was                     deemed in satisfactory condition to undergo the procedure.                    After obtaining informed consent, the colonoscope was                     passed under direct vision. Throughout the procedure, the                     patient's blood pressure, pulse, and oxygen saturations                     were monitored continuously. The Olympus PCF-H180AL                     colonoscope ( S#: [id number] ) was introduced through the                     anus and advanced to the the cecum, identified by                     appendiceal orifice and ileocecal valve. Findings:      A small polyp was found in the transverse colon. The polyp was sessile.       The polyp was removed with a cold snare. Resection and retrieval were       complete.      Two sessile polyps were found in the cecum. The polyps were diminutive       in size. These polyps were removed with a cold snare. Resection and       retrieval were complete. One of the Colon polyps partly removed with       forceps also.      A few small-mouthed diverticula were found in the sigmoid colon.      Internal hemorrhoids were found during endoscopy. The hemorrhoids were       smallto medium and Grade I (internal hemorrhoids that do not prolapse). Impression:         - One small polyp in the transverse colon. Resected and                     retrieved.                    - Two diminutive polyps in the cecum. Resected and  retrieved.                    - Diverticulosis in the sigmoid colon.                    - Internal hemorrhoids. Recommendation:    - Await pathology results. Manya Silvas, MD 07/02/2015 11:34:21 AM This report has been signed electronically. Number of Addenda: 0 Note Initiated On: 07/02/2015 10:39 AM Scope Withdrawal Time: 0 hours 9 minutes 37 seconds  Total Procedure Duration: 0 hours 15 minutes 33 seconds       [facility]

## 2015-07-03 ENCOUNTER — Encounter: Payer: Self-pay | Admitting: Unknown Physician Specialty

## 2015-07-03 LAB — SURGICAL PATHOLOGY

## 2015-11-03 ENCOUNTER — Encounter: Payer: Self-pay | Admitting: Emergency Medicine

## 2015-11-03 ENCOUNTER — Ambulatory Visit (INDEPENDENT_AMBULATORY_CARE_PROVIDER_SITE_OTHER): Payer: Medicare PPO

## 2015-11-03 ENCOUNTER — Ambulatory Visit
Admission: EM | Admit: 2015-11-03 | Discharge: 2015-11-03 | Disposition: A | Discharge status: Home / Self Care | Payer: Medicare PPO | Attending: Family Medicine | Admitting: Family Medicine

## 2015-11-03 DIAGNOSIS — H6593 Unspecified nonsuppurative otitis media, bilateral: Secondary | ICD-10-CM | POA: Diagnosis not present

## 2015-11-03 DIAGNOSIS — J019 Acute sinusitis, unspecified: Secondary | ICD-10-CM | POA: Diagnosis not present

## 2015-11-03 DIAGNOSIS — J209 Acute bronchitis, unspecified: Secondary | ICD-10-CM

## 2015-11-03 MED ORDER — ALBUTEROL SULFATE HFA 108 (90 BASE) MCG/ACT IN AERS: 1.0000 | INHALATION_SPRAY | RESPIRATORY_TRACT | Status: DC | PRN

## 2015-11-03 MED ORDER — HYDROCODONE-HOMATROPINE 5-1.5 MG/5ML PO SYRP: 5.0000 mL | ORAL_SOLUTION | Freq: Every evening | ORAL | Status: AC | PRN

## 2015-11-03 MED ORDER — FLUTICASONE PROPIONATE 50 MCG/ACT NA SUSP: 1.0000 | Freq: Two times a day (BID) | NASAL | Status: DC

## 2015-11-03 MED ORDER — DOXYCYCLINE HYCLATE 100 MG PO CAPS: 100.0000 mg | ORAL_CAPSULE | Freq: Two times a day (BID) | ORAL | Status: AC

## 2015-11-03 MED ORDER — BENZONATATE 100 MG PO CAPS: 100.0000 mg | ORAL_CAPSULE | Freq: Three times a day (TID) | ORAL | Status: DC | PRN

## 2015-11-03 MED ORDER — IPRATROPIUM-ALBUTEROL 0.5-2.5 (3) MG/3ML IN SOLN
3.0000 mL | Freq: Four times a day (QID) | RESPIRATORY_TRACT | Status: DC
  Administered 2015-11-03: 3 mL via RESPIRATORY_TRACT

## 2015-11-03 NOTE — Discharge Instructions (Signed)
Acute Bronchitis Bronchitis is inflammation of the airways that extend from the windpipe into the lungs (bronchi). The inflammation often causes mucus to develop. This leads to a cough, which is the most common symptom of bronchitis.  In acute bronchitis, the condition usually develops suddenly and goes away over time, usually in a couple weeks. Smoking, allergies, and asthma can make bronchitis worse. Repeated episodes of bronchitis may cause further lung problems.  CAUSES Acute bronchitis is most often caused by the same virus that causes a cold. The virus can spread from person to person (contagious) through coughing, sneezing, and touching contaminated objects. SIGNS AND SYMPTOMS   Cough.   Fever.   Coughing up mucus.   Body aches.   Chest congestion.   Chills.   Shortness of breath.   Sore throat.  DIAGNOSIS  Acute bronchitis is usually diagnosed through a physical exam. Your health care provider will also ask you questions about your medical history. Tests, such as chest X-rays, are sometimes done to rule out other conditions.  TREATMENT  Acute bronchitis usually goes away in a couple weeks. Oftentimes, no medical treatment is necessary. Medicines are sometimes given for relief of fever or cough. Antibiotic medicines are usually not needed but may be prescribed in certain situations. In some cases, an inhaler may be recommended to help reduce shortness of breath and control the cough. A cool mist vaporizer may also be used to help thin bronchial secretions and make it easier to clear the chest.  HOME CARE INSTRUCTIONS  Get plenty of rest.   Drink enough fluids to keep your urine clear or pale yellow (unless you have a medical condition that requires fluid restriction). Increasing fluids may help thin your respiratory secretions (sputum) and reduce chest congestion, and it will prevent dehydration.   Take medicines only as directed by your health care provider.  If  you were prescribed an antibiotic medicine, finish it all even if you start to feel better.  Avoid smoking and secondhand smoke. Exposure to cigarette smoke or irritating chemicals will make bronchitis worse. If you are a smoker, consider using nicotine gum or skin patches to help control withdrawal symptoms. Quitting smoking will help your lungs heal faster.   Reduce the chances of another bout of acute bronchitis by washing your hands frequently, avoiding people with cold symptoms, and trying not to touch your hands to your mouth, nose, or eyes.   Keep all follow-up visits as directed by your health care provider.  SEEK MEDICAL CARE IF: Your symptoms do not improve after 1 week of treatment.  SEEK IMMEDIATE MEDICAL CARE IF:  You develop an increased fever or chills.   You have chest pain.   You have severe shortness of breath.  You have bloody sputum.   You develop dehydration.  You faint or repeatedly feel like you are going to pass out.  You develop repeated vomiting.  You develop a severe headache. MAKE SURE YOU:   Understand these instructions.  Will watch your condition.  Will get help right away if you are not doing well or get worse.   This information is not intended to replace advice given to you by your health care provider. Make sure you discuss any questions you have with your health care provider.   Document Released: 12/18/2004 Document Revised: 12/01/2014 Document Reviewed: 05/03/2013 Elsevier Interactive Patient Education 2016 [employer]. Otitis Media With Effusion Otitis media with effusion is the presence of fluid in the middle ear. This is  a common problem in children, which often follows ear infections. It may be present for weeks or longer after the infection. Unlike an acute ear infection, otitis media with effusion refers only to fluid behind the ear drum and not infection. Children with repeated ear and sinus infections and allergy problems  are the most likely to get otitis media with effusion. CAUSES  The most frequent cause of the fluid buildup is dysfunction of the eustachian tubes. These are the tubes that drain fluid in the ears to the back of the nose (nasopharynx). SYMPTOMS   The main symptom of this condition is hearing loss. As a result, you or your child may:  Listen to the TV at a loud volume.  Not respond to questions.  Ask "what" often when spoken to.  Mistake or confuse one sound or word for another.  There may be a sensation of fullness or pressure but usually not pain. DIAGNOSIS   Your health care provider will diagnose this condition by examining you or your child's ears.  Your health care provider may test the pressure in you or your child's ear with a tympanometer.  A hearing test may be conducted if the problem persists. TREATMENT   Treatment depends on the duration and the effects of the effusion.  Antibiotics, decongestants, nose drops, and cortisone-type drugs (tablets or nasal spray) may not be helpful.  Children with persistent ear effusions may have delayed language or behavioral problems. Children at risk for developmental delays in hearing, learning, and speech may require referral to a specialist earlier than children not at risk.  You or your child's health care provider may suggest a referral to an ear, nose, and throat surgeon for treatment. The following may help restore normal hearing:  Drainage of fluid.  Placement of ear tubes (tympanostomy tubes).  Removal of adenoids (adenoidectomy). HOME CARE INSTRUCTIONS   Avoid secondhand smoke.  Infants who are breastfed are less likely to have this condition.  Avoid feeding infants while they are lying flat.  Avoid known environmental allergens.  Avoid people who are sick. SEEK MEDICAL CARE IF:   Hearing is not better in 3 months.  Hearing is worse.  Ear pain.  Drainage from the ear.  Dizziness. MAKE SURE YOU:    Understand these instructions.  Will watch your condition.  Will get help right away if you are not doing well or get worse.   This information is not intended to replace advice given to you by your health care provider. Make sure you discuss any questions you have with your health care provider.   Document Released: 12/18/2004 Document Revised: 12/01/2014 Document Reviewed: 06/07/2013 Elsevier Interactive Patient Education 2016 [employer] Sinusitis, Adult Sinusitis is redness, soreness, and inflammation of the paranasal sinuses. Paranasal sinuses are air pockets within the bones of your face. They are located beneath your eyes, in the middle of your forehead, and above your eyes. In healthy paranasal sinuses, mucus is able to drain out, and air is able to circulate through them by way of your nose. However, when your paranasal sinuses are inflamed, mucus and air can become trapped. This can allow bacteria and other germs to grow and cause infection. Sinusitis can develop quickly and last only a short time (acute) or continue over a long period (chronic). Sinusitis that lasts for more than 12 weeks is considered chronic. CAUSES Causes of sinusitis include:  Allergies.  Structural abnormalities, such as displacement of the cartilage that separates your nostrils (deviated septum),  which can decrease the air flow through your nose and sinuses and affect sinus drainage.  Functional abnormalities, such as when the small hairs (cilia) that line your sinuses and help remove mucus do not work properly or are not present. SIGNS AND SYMPTOMS Symptoms of acute and chronic sinusitis are the same. The primary symptoms are pain and pressure around the affected sinuses. Other symptoms include:  Upper toothache.  Earache.  Headache.  Bad breath.  Decreased sense of smell and taste.  A cough, which worsens when you are lying flat.  Fatigue.  Fever.  Thick drainage from your nose, which  often is green and may contain pus (purulent).  Swelling and warmth over the affected sinuses. DIAGNOSIS Your health care provider will perform a physical exam. During your exam, your health care provider may perform any of the following to help determine if you have acute sinusitis or chronic sinusitis:  Look in your nose for signs of abnormal growths in your nostrils (nasal polyps).  Tap over the affected sinus to check for signs of infection.  View the inside of your sinuses using an imaging device that has a light attached (endoscope). If your health care provider suspects that you have chronic sinusitis, one or more of the following tests may be recommended:  Allergy tests.  Nasal culture. A sample of mucus is taken from your nose, sent to a lab, and screened for bacteria.  Nasal cytology. A sample of mucus is taken from your nose and examined by your health care provider to determine if your sinusitis is related to an allergy. TREATMENT Most cases of acute sinusitis are related to a viral infection and will resolve on their own within 10 days. Sometimes, medicines are prescribed to help relieve symptoms of both acute and chronic sinusitis. These may include pain medicines, decongestants, nasal steroid sprays, or saline sprays. However, for sinusitis related to a bacterial infection, your health care provider will prescribe antibiotic medicines. These are medicines that will help kill the bacteria causing the infection. Rarely, sinusitis is caused by a fungal infection. In these cases, your health care provider will prescribe antifungal medicine. For some cases of chronic sinusitis, surgery is needed. Generally, these are cases in which sinusitis recurs more than 3 times per year, despite other treatments. HOME CARE INSTRUCTIONS  Drink plenty of water. Water helps thin the mucus so your sinuses can drain more easily.  Use a humidifier.  Inhale steam 3-4 times a day (for example, sit  in the bathroom with the shower running).  Apply a warm, moist washcloth to your face 3-4 times a day, or as directed by your health care provider.  Use saline nasal sprays to help moisten and clean your sinuses.  Take medicines only as directed by your health care provider.  If you were prescribed either an antibiotic or antifungal medicine, finish it all even if you start to feel better. SEEK IMMEDIATE MEDICAL CARE IF:  You have increasing pain or severe headaches.  You have nausea, vomiting, or drowsiness.  You have swelling around your face.  You have vision problems.  You have a stiff neck.  You have difficulty breathing.   This information is not intended to replace advice given to you by your health care provider. Make sure you discuss any questions you have with your health care provider.   Document Released: 11/10/2005 Document Revised: 12/01/2014 Document Reviewed: 11/25/2011 Elsevier Interactive Patient Education [employer].

## 2015-11-03 NOTE — ED Notes (Signed)
Patient c/o cough and chest congestion for 2 days.

## 2015-11-03 NOTE — ED Provider Notes (Signed)
CSN: [id number]     Arrival date & time 11/03/15  1244 History   First MD Initiated Contact with Patient 11/03/15 1432     Chief Complaint  Patient presents with  . Cough   (Consider location/radiation/quality/duration/timing/severity/associated sxs/prior Treatment) HPI Comments: Widowed caucasian female here with friend for evaluation of harsh productive cough yellow/clear thick sputum sticks on lips/teeth; keeping her up at night, wheezing, chest congestion and tightness  Has tried chloraseptic spray and salt water gargles for sore throat and it helps but congestion and chest pain with cough not improving  Denied fever/chills/nausea/vomiting/diarrhea/rash FHx:  F, M - MI  M-PAD, asthma  Patient is a 74 y.o. female presenting with cough. The history is provided by the patient and a friend.  Cough Cough characteristics:  Productive Sputum characteristics:  Clear and yellow Severity:  Severe Onset quality:  Sudden Duration:  2 days Timing:  Constant Progression:  Unchanged Chronicity:  New Context: sick contacts, upper respiratory infection, weather changes and with activity   Context: not animal exposure, not exposure to allergens, not fumes, not occupational exposure and not smoke exposure   Relieved by:  Nothing Worsened by:  Deep breathing, activity, environmental changes, exposure to cold air and lying down Ineffective treatments:  None tried Associated symptoms: chills, headaches, myalgias, shortness of breath, sinus congestion, sore throat and wheezing   Associated symptoms: no chest pain, no diaphoresis, no ear fullness, no ear pain, no eye discharge, no fever, no rash, no rhinorrhea and no weight loss   Shortness of breath:    Severity:  Mild   Onset quality:  Sudden   Duration:  2 days   Timing:  Intermittent   Progression:  Unchanged Sore throat:    Severity:  Mild   Onset quality:  Sudden   Duration:  1 day   Timing:  Intermittent   Progression:  Improving Wheezing:     Severity:  Mild   Onset quality:  Sudden   Duration:  2 days   Timing:  Intermittent   Progression:  Improving   Chronicity:  New Risk factors: no chemical exposure, no recent infection and no recent travel     Past Medical History  Diagnosis Date  . Hypertension   . Anxiety   . Sleep apnea   . Chronic kidney disease   . Arthritis   . Hypothyroidism   . GERD (gastroesophageal reflux disease)   . Constipation    Past Surgical History  Procedure Laterality Date  . Joint replacement Left     knee  . Breast biopsy    . Correction hammer toe Right   . Esophagogastroduodenoscopy (egd) with propofol N/A 07/02/2015    Procedure: ESOPHAGOGASTRODUODENOSCOPY (EGD) WITH PROPOFOL;  Surgeon: Manya Silvas, MD;  Location: [place];  Service: Endoscopy;  Laterality: N/A;  . Colonoscopy with propofol N/A 07/02/2015    Procedure: COLONOSCOPY WITH PROPOFOL;  Surgeon: Manya Silvas, MD;  Location: [facility] ENDOSCOPY;  Service: Endoscopy;  Laterality: N/A;   History reviewed. No pertinent family history. Social History  Substance Use Topics  . Smoking status: Never Smoker   . Smokeless tobacco: Never Used  . Alcohol Use: No   OB History    No data available     Review of Systems  Constitutional: Positive for chills, activity change, appetite change and fatigue. Negative for fever, weight loss, diaphoresis and unexpected weight change.  HENT: Positive for congestion, nosebleeds, postnasal drip, sinus pressure and sore throat. Negative for dental problem, drooling, ear  discharge, ear pain, facial swelling, hearing loss, mouth sores, rhinorrhea, sneezing, tinnitus, trouble swallowing and voice change.   Eyes: Negative for photophobia, pain, discharge, redness, itching and visual disturbance.  Respiratory: Positive for cough, shortness of breath and wheezing. Negative for choking, chest tightness and stridor.   Cardiovascular: Negative for chest pain, palpitations and leg swelling.   Gastrointestinal: Negative for nausea, vomiting, abdominal pain, diarrhea, constipation, blood in stool and abdominal distention.  Endocrine: Negative for cold intolerance and heat intolerance.  Genitourinary: Negative for dysuria, hematuria and difficulty urinating.  Musculoskeletal: Positive for myalgias. Negative for back pain, joint swelling, arthralgias, gait problem, neck pain and neck stiffness.  Skin: Negative for color change, pallor, rash and wound.  Allergic/Immunologic: Positive for environmental allergies. Negative for food allergies.  Neurological: Positive for headaches. Negative for dizziness, tremors, seizures, syncope, facial asymmetry, speech difficulty, weakness, light-headedness and numbness.  Hematological: Negative for adenopathy. Does not bruise/bleed easily.  Psychiatric/Behavioral: Positive for sleep disturbance. Negative for behavioral problems, confusion and agitation.    Allergies  Ace inhibitors; Benicar; Amoxicillin; Codeine sulfate; Levaquin; Penicillin v potassium; and Sulfa antibiotics  Home Medications   Prior to Admission medications   Medication Sig Start Date End Date Taking? Authorizing Provider  albuterol (PROVENTIL HFA;VENTOLIN HFA) 108 (90 BASE) MCG/ACT inhaler Inhale 1-2 puffs into the lungs every 4 (four) hours as needed for wheezing or shortness of breath. 11/03/15   Olen Cordial, NP  alendronate (FOSAMAX) 70 MG tablet Take 70 mg by mouth once a week. Take with a full glass of water on an empty stomach.    Historical Provider, MD  amLODipine (NORVASC) 10 MG tablet Take 10 mg by mouth daily.    Historical Provider, MD  aspirin EC 81 MG tablet Take 81 mg by mouth daily.    Historical Provider, MD  benzonatate (TESSALON) 100 MG capsule Take 1 capsule (100 mg total) by mouth 3 (three) times daily as needed. 11/03/15   Olen Cordial, NP  carvedilol (COREG) 6.25 MG tablet Take 6.25 mg by mouth 2 (two) times daily with a meal.    Historical  Provider, MD  Cholecalciferol (VITAMIN D-3) 1000 UNITS CAPS Take 1,000 Units by mouth once.    Historical Provider, MD  doxycycline (VIBRAMYCIN) 100 MG capsule Take 1 capsule (100 mg total) by mouth 2 (two) times daily. 11/05/15 11/15/15  Olen Cordial, NP  fluticasone (FLONASE) 50 MCG/ACT nasal spray Place 1 spray into both nostrils 2 (two) times daily. 11/03/15   Olen Cordial, NP  HYDROcodone-homatropine (HYCODAN) 5-1.5 MG/5ML syrup Take 5 mLs by mouth at bedtime as needed for cough. 11/03/15 11/09/15  Olen Cordial, NP  isosorbide mononitrate (IMDUR) 30 MG 24 hr tablet Take 30 mg by mouth daily.    Historical Provider, MD  levothyroxine (SYNTHROID, LEVOTHROID) 50 MCG tablet Take 50 mcg by mouth daily before breakfast.    Historical Provider, MD  LORazepam (ATIVAN) 2 MG tablet Take 2 mg by mouth every 6 (six) hours as needed for anxiety.    Historical Provider, MD  Multiple Vitamin-Folic Acid TABS Take by mouth every morning.    Historical Provider, MD  pantoprazole (PROTONIX) 40 MG tablet Take 40 mg by mouth daily.    Historical Provider, MD  potassium chloride SA (K-DUR,KLOR-CON) 20 MEQ tablet Take 20 mEq by mouth every morning.    Historical Provider, MD  pravastatin (PRAVACHOL) 40 MG tablet Take 40 mg by mouth daily.    Historical Provider, MD  sertraline (ZOLOFT)  100 MG tablet Take 100 mg by mouth daily.    Historical Provider, MD  torsemide (DEMADEX) 20 MG tablet Take 20 mg by mouth daily.    Historical Provider, MD   Meds Ordered and Administered this Visit   Medications  ipratropium-albuterol (DUONEB) 0.5-2.5 (3) MG/3ML nebulizer solution 3 mL (3 mLs Nebulization Given 11/03/15 1442)    BP 135/83 mmHg  Pulse 70  Temp(Src) 96.7 F (35.9 C) (Tympanic)  Resp 18  Ht 5' (1.524 m)  Wt 180 lb (81.647 kg)  BMI 35.15 kg/m2  SpO2 97% No data found.   Physical Exam  Constitutional: She is oriented to person, place, and time. She appears well-developed and well-nourished.  She is active and cooperative.  Non-toxic appearance. She does not have a sickly appearance. She appears ill. No distress.  HENT:  Head: Normocephalic and atraumatic.  Right Ear: Hearing, external ear and ear canal normal. A middle ear effusion is present.  Left Ear: Hearing, external ear and ear canal normal. A middle ear effusion is present.  Nose: Mucosal edema and rhinorrhea present. No nose lacerations, sinus tenderness, nasal deformity, septal deviation or nasal septal hematoma. No epistaxis.  No foreign bodies. Right sinus exhibits no maxillary sinus tenderness and no frontal sinus tenderness. Left sinus exhibits no maxillary sinus tenderness and no frontal sinus tenderness.  Mouth/Throat: Uvula is midline and mucous membranes are normal. Mucous membranes are not pale, not dry and not cyanotic. She does not have dentures. No oral lesions. No trismus in the jaw. Normal dentition. No dental abscesses, uvula swelling, lacerations or dental caries. Posterior oropharyngeal edema and posterior oropharyngeal erythema present. No oropharyngeal exudate or tonsillar abscesses.  Cobblestoning posterior pharynx; bilateral TMs with air fluid level slight opacity; bilateral nares with edema/erythema turbinates clear/yellow discharge  Eyes: Conjunctivae, EOM and lids are normal. Pupils are equal, round, and reactive to light. Right eye exhibits no chemosis, no discharge, no exudate and no hordeolum. No foreign body present in the right eye. Left eye exhibits no chemosis, no discharge, no exudate and no hordeolum. No foreign body present in the left eye. Right conjunctiva is not injected. Right conjunctiva has no hemorrhage. Left conjunctiva is not injected. Left conjunctiva has no hemorrhage. No scleral icterus. Right eye exhibits normal extraocular motion and no nystagmus. Left eye exhibits normal extraocular motion and no nystagmus. Right pupil is round and reactive. Left pupil is round and reactive. Pupils are  equal.  Neck: Trachea normal and normal range of motion. Neck supple. No tracheal tenderness, no spinous process tenderness and no muscular tenderness present. No rigidity. No tracheal deviation, no edema, no erythema and normal range of motion present. No thyroid mass and no thyromegaly present.  Cardiovascular: Normal rate, regular rhythm, S1 normal, S2 normal, normal heart sounds and intact distal pulses.  PMI is not displaced.  Exam reveals no gallop and no friction rub.   No murmur heard. Pulmonary/Chest: Effort normal. No accessory muscle usage or stridor. No respiratory distress. She has decreased breath sounds in the right lower field and the left lower field. She has wheezes in the right upper field, the right middle field, the left upper field and the left middle field. She has rhonchi in the right middle field and the left middle field. She has no rales. She exhibits no tenderness.  Harsh nonproductive cough frequent with speaking/deep breaths; inspiratory wheeze; rhonchi with cough bilaterally  Abdominal: Soft. She exhibits no distension.  Musculoskeletal: Normal range of motion. She exhibits no edema  or tenderness.       Right shoulder: Normal.       Left shoulder: Normal.       Right hip: Normal.       Left hip: Normal.       Right knee: Normal.       Left knee: Normal.       Cervical back: Normal.       Right hand: Normal.       Left hand: Normal.  Lymphadenopathy:       Head (right side): No submental, no submandibular, no tonsillar, no preauricular, no posterior auricular and no occipital adenopathy present.       Head (left side): No submental, no submandibular, no tonsillar, no preauricular, no posterior auricular and no occipital adenopathy present.    She has no cervical adenopathy.       Right cervical: No superficial cervical, no deep cervical and no posterior cervical adenopathy present.      Left cervical: No superficial cervical, no deep cervical and no posterior  cervical adenopathy present.  Neurological: She is alert and oriented to person, place, and time. She has normal strength. She is not disoriented. She displays no atrophy and no tremor. No cranial nerve deficit or sensory deficit. She exhibits normal muscle tone. She displays no seizure activity. Coordination and gait normal. GCS eye subscore is 4. GCS verbal subscore is 5. GCS motor subscore is 6.  Skin: Skin is warm, dry and intact. No abrasion, no bruising, no burn, no ecchymosis, no laceration, no lesion, no petechiae and no rash noted. She is not diaphoretic. No cyanosis or erythema. No pallor. Nails show no clubbing.  Psychiatric: She has a normal mood and affect. Her speech is normal and behavior is normal. Judgment and thought content normal. Cognition and memory are normal.  Nursing note and vitals reviewed.   ED Course  Procedures (including critical care time)  Labs Review Labs Reviewed - No data to display  Imaging Review Dg Chest 2 View  11/03/2015  CLINICAL DATA:  74 year old with acute onset of productive cough 2 days ago. EXAM: CHEST  2 VIEW COMPARISON:  12/18/2012, 08/12/2012. FINDINGS: Cardiac silhouette normal in size, unchanged. Thoracic aorta atherosclerotic, unchanged. Hilar and mediastinal contours otherwise unremarkable. Lungs clear. Bronchovascular markings normal. Pulmonary vascularity normal. No visible pleural effusions. No pneumothorax. Degenerative changes throughout the thoracic spine and degenerative changes in both AC joints. No significant interval change. IMPRESSION: No acute cardiopulmonary disease.  Stable examination. Electronically Signed   By: Evangeline Dakin M.D.   On: 11/03/2015 15:43    1445 duoneb 15ml administered by RN Tula Nakayama  1505 improved airflow BLL still with rhonchi and wheezing chest xray pending patient reported she is feeling better in her chest after duoneb.  Congestion and tightness resolved  1600 discussed chest xray results with  patient given copy of radiology report.  Refused prednisone as she is unable to sleep starts feeling not right in the head.  Will start flonase, albuterol, tessalon pearles and if needed hycodan at bedtime 74ml previously taken without side effects with last episode of bronchitis Dec 2015.  Discussed with patient hypokalemia possible with albuterol use/medication interaction with her chronic medications last serum K 4.7 on KDUR.  Patient cough decreased frequency after duoneb able to speak full sentences.  Patient verbalized understanding of information/instructuions, agreed with plan of care and had no further questions at this time.  MDM   1. Acute rhinosinusitis   2. Otitis media  with effusion, bilateral   3. Bronchitis, acute, with bronchospasm    Supportive treatment.   No evidence of invasive bacterial infection, non toxic and well hydrated.  This is most likely self limiting viral infection.  I do not see where any further testing or imaging is necessary at this time.   I will suggest supportive care, rest, good hygiene and encourage the patient to take adequate fluids.  The patient is to return to clinic or EMERGENCY ROOM if symptoms worsen or change significantly e.g. ear pain, fever, purulent discharge from ears or bleeding.  Exitcare handout on otitis media with effusion given to patient.  Patient verbalized agreement and understanding of treatment plan.    Suspect Viral illness: no evidence of invasive bacterial infection, non toxic and well hydrated.  This is most likely self limiting viral infection.  I do not see where any further testing or imaging is necessary at this time.   I will suggest supportive care, rest, good hygiene and encourage the patient to take adequate fluids.  Does not require work excuse. flonase 1 spray each nostril BID prn, nasal saline 1-2 sprays each nostril prn q2h, tylenol 1000mg  po QID prn.  Has taken hycodan approximately 1 year ago without adverse side effects  refilled for patient ran out this past spring.  Tessalon pearles 100mg  po TID prn cough.   Albuterol 1-2 puffs po q4-6h prn cough/wheeze/chest tightness.  Discussed honey with lemon and salt water gargles for comfort also.  The patient is to return to clinic or EMERGENCY ROOM if symptoms worsen or change significantly e.g. fever, lethargy, SOB, wheezing.  Exitcare handout on viral illness given to patient.  Patient verbalized agreement and understanding of treatment plan.    Start flonase and nasal saline Rx given.  If no relief by Monday/48 hours flonase use start doxycycline 100mg  po BID sinus congestion/post nasal drip.  No evidence of systemic bacterial infection, non toxic and well hydrated.  I do not see where any further testing or imaging is necessary at this time.   I will suggest supportive care, rest, good hygiene and encourage the patient to take adequate fluids.  The patient is to return to clinic or EMERGENCY ROOM if symptoms worsen or change significantly.  Exitcare handout on sinusitis given to patient.  Patient verbalized agreement and understanding of treatment plan and had no further questions at this time.   P2:  Hand washing and cover cough  Has Rx for doxycycline if purulence of cough worsening, fever.  Rx albuterol MDI demonstrated use to patient also.  Bronchitis simple, community acquired, may have started as viral (probably respiratory syncytial, parainfluenza, influenza, or adenovirus), but now evidence of acute purulent bronchitis with resultant bronchial edema and mucus formation.  Viruses are the most common cause of bronchial inflammation in otherwise healthy adults with acute bronchitis.  The appearance of sputum is not predictive of whether a bacterial infection is present.  Purulent sputum is most often caused by viral infections.  There are a small portion of those caused by non-viral agents being Mycoplamsa pneumonia.  Microscopic examination or C&S of sputum in the healthy  adult with acute bronchitis is generally not helpful (usually negative or normal respiratory flora) other considerations being cough from upper respiratory tract infections, sinusitis or allergic syndromes (mild asthma or viral pneumonia).  Differential Diagnosis:  reactive airway disease (asthma, allergic aspergillosis (eosinophilia), chronic bronchitis, respiratory infection (Sinusitis, Common cold, pneumonia), congestive heart failure, reflux esophagitis, bronchogenic tumor, aspiration syndromes and/or  exposure irritants/tobacco smoke.  In this case, there is no evidence of any invasive bacterial illness.  Most likely viral etiology so will hold on antibiotic treatment.  Advise supportive care with rest, encourage fluids, good hygiene and watch for any worsening symptoms.  If they were to develop:  come back to the office or go to the emergency room if after hours.  Without high fever, severe dyspnea, lack of physical findings or other risk factors, I will hold on CBC at this time.  I discussed that approximately 50% of patients with acute bronchitis have a cough that lasts up to three weeks, and 25% for over a month.  Tylenol, one to two tablets every four hours as needed for fever or myalgias.   No aspirin.  Patient instructed to follow up in one week or sooner if symptoms worsen. Patient verbalized agreement and understanding of treatment plan.  P2:  hand washing and cover cough     Olen Cordial, NP 11/04/15 [phone]

## 2015-11-08 ENCOUNTER — Encounter: Payer: Self-pay | Admitting: Emergency Medicine

## 2015-11-08 ENCOUNTER — Emergency Department
Admission: EM | Admit: 2015-11-08 | Discharge: 2015-11-08 | Disposition: A | Discharge status: Home / Self Care | Payer: Medicare HMO | Attending: Emergency Medicine | Admitting: Emergency Medicine

## 2015-11-08 ENCOUNTER — Emergency Department: Payer: Medicare HMO

## 2015-11-08 DIAGNOSIS — Z880 Allergy status to penicillin: Secondary | ICD-10-CM | POA: Insufficient documentation

## 2015-11-08 DIAGNOSIS — R05 Cough: Secondary | ICD-10-CM | POA: Diagnosis not present

## 2015-11-08 DIAGNOSIS — K529 Noninfective gastroenteritis and colitis, unspecified: Secondary | ICD-10-CM

## 2015-11-08 DIAGNOSIS — N189 Chronic kidney disease, unspecified: Secondary | ICD-10-CM | POA: Insufficient documentation

## 2015-11-08 DIAGNOSIS — R0981 Nasal congestion: Secondary | ICD-10-CM | POA: Insufficient documentation

## 2015-11-08 DIAGNOSIS — R112 Nausea with vomiting, unspecified: Secondary | ICD-10-CM | POA: Diagnosis present

## 2015-11-08 DIAGNOSIS — I129 Hypertensive chronic kidney disease with stage 1 through stage 4 chronic kidney disease, or unspecified chronic kidney disease: Secondary | ICD-10-CM | POA: Diagnosis not present

## 2015-11-08 LAB — COMPREHENSIVE METABOLIC PANEL
ALT: 17 U/L (ref 14–54)
AST: 31 U/L (ref 15–41)
Albumin: 4.8 g/dL (ref 3.5–5.0)
Alkaline Phosphatase: 55 U/L (ref 38–126)
Anion gap: 12 (ref 5–15)
BUN: 6 mg/dL (ref 6–20)
CO2: 24 mmol/L (ref 22–32)
Calcium: 10.8 mg/dL — ABNORMAL HIGH (ref 8.9–10.3)
Chloride: 105 mmol/L (ref 101–111)
Creatinine, Ser: 0.85 mg/dL (ref 0.44–1.00)
GFR calc Af Amer: >60 mL/min (ref >60)
GFR calc non Af Amer: >60 mL/min (ref >60)
Glucose, Bld: 107 mg/dL — ABNORMAL HIGH (ref 65–99)
Potassium: 4 mmol/L (ref 3.5–5.1)
Sodium: 141 mmol/L (ref 135–145)
Total Bilirubin: 0.8 mg/dL (ref 0.3–1.2)
Total Protein: 8.2 g/dL — ABNORMAL HIGH (ref 6.5–8.1)

## 2015-11-08 LAB — URINALYSIS COMPLETE WITH MICROSCOPIC (ARMC ONLY)
Bacteria, UA: NONE SEEN
Bilirubin Urine: NEGATIVE
Glucose, UA: NEGATIVE mg/dL
Ketones, ur: NEGATIVE mg/dL
Nitrite: NEGATIVE
Protein, ur: 30 mg/dL — AB
Specific Gravity, Urine: 1.004 — ABNORMAL LOW (ref 1.005–1.030)
pH: 8 (ref 5.0–8.0)

## 2015-11-08 LAB — TROPONIN I: Troponin I: <0.03 ng/mL (ref <0.031)

## 2015-11-08 LAB — CBC WITH DIFFERENTIAL/PLATELET
Basophils Absolute: 0 10*3/uL (ref 0–0.1)
Basophils Relative: 1 %
Eosinophils Absolute: 0.1 10*3/uL (ref 0–0.7)
Eosinophils Relative: 1 %
HCT: 41.7 % (ref 35.0–47.0)
Hemoglobin: 13.8 g/dL (ref 12.0–16.0)
Lymphocytes Relative: 16 %
Lymphs Abs: 1.2 10*3/uL (ref 1.0–3.6)
MCH: 29.7 pg (ref 26.0–34.0)
MCHC: 33.2 g/dL (ref 32.0–36.0)
MCV: 89.7 fL (ref 80.0–100.0)
Monocytes Absolute: 0.3 10*3/uL (ref 0.2–0.9)
Monocytes Relative: 4 %
Neutro Abs: 5.9 10*3/uL (ref 1.4–6.5)
Neutrophils Relative %: 78 %
Platelets: 250 10*3/uL (ref 150–440)
RBC: 4.65 MIL/uL (ref 3.80–5.20)
RDW: 15.1 % — ABNORMAL HIGH (ref 11.5–14.5)
WBC: 7.5 10*3/uL (ref 3.6–11.0)

## 2015-11-08 LAB — LIPASE, BLOOD: Lipase: 25 U/L (ref 11–51)

## 2015-11-08 MED ORDER — IOHEXOL 240 MG/ML SOLN
25.0000 mL | Freq: Once | INTRAMUSCULAR | Status: AC | PRN
  Administered 2015-11-08: 50 mL via INTRAVENOUS

## 2015-11-08 MED ORDER — SODIUM CHLORIDE 0.9 % IV SOLN
Freq: Once | INTRAVENOUS | Status: AC
  Administered 2015-11-08: 10:00:00 via INTRAVENOUS

## 2015-11-08 MED ORDER — IOHEXOL 300 MG/ML  SOLN
100.0000 mL | Freq: Once | INTRAMUSCULAR | Status: AC | PRN
  Administered 2015-11-08: 100 mL via INTRAVENOUS

## 2015-11-08 MED ORDER — ONDANSETRON HCL 4 MG PO TABS: 4.0000 mg | ORAL_TABLET | Freq: Every day | ORAL | Status: DC | PRN

## 2015-11-08 MED ORDER — LOPERAMIDE HCL 2 MG PO TABS: 2.0000 mg | ORAL_TABLET | Freq: Four times a day (QID) | ORAL | Status: DC | PRN

## 2015-11-08 MED ORDER — ONDANSETRON HCL 4 MG/2ML IJ SOLN
4.0000 mg | Freq: Once | INTRAMUSCULAR | Status: AC
  Administered 2015-11-08: 4 mg via INTRAVENOUS
  Filled 2015-11-08: qty 2

## 2015-11-08 NOTE — Discharge Instructions (Signed)
Norovirus Infection °A norovirus infection is caused by exposure to a virus in a group of similar viruses (noroviruses). This type of infection causes inflammation in your stomach and intestines (gastroenteritis). Norovirus is the most common cause of gastroenteritis. It also causes food poisoning. °Anyone can get a norovirus infection. It spreads very easily (contagious). You can get it from contaminated food, water, surfaces, or other people. Norovirus is found in the stool or vomit of infected people. You can spread the infection as soon as you feel sick until 2 weeks after you recover.  °Symptoms usually begin within 2 days after you become infected. Most norovirus symptoms affect the digestive system. °CAUSES °Norovirus infection is caused by contact with norovirus. You can catch norovirus if you: °· Eat or drink something contaminated with norovirus. °· Touch surfaces or objects contaminated with norovirus and then put your hand in your mouth. °· Have direct contact with an infected person who has symptoms. °· Share food, drink, or utensils with someone with who is sick with norovirus. °SIGNS AND SYMPTOMS °Symptoms of norovirus may include: °· Nausea. °· Vomiting. °· Diarrhea. °· Stomach cramps. °· Fever. °· Chills. °· Headache. °· Muscle aches. °· Tiredness. °DIAGNOSIS °Your health care provider may suspect norovirus based on your symptoms and physical exam. Your health care provider may also test a sample of your stool or vomit for the virus.  °TREATMENT °There is no specific treatment for norovirus. Most people get better without treatment in about 2 days. °HOME CARE INSTRUCTIONS °· Replace lost fluids by drinking plenty of water or rehydration fluids containing important minerals called electrolytes. This prevents dehydration. Drink enough fluid to keep your urine clear or pale yellow. °· Do not prepare food for others while you are infected. Wait at least 3 days after recovering from the illness to do  that. °PREVENTION  °· Wash your hands often, especially after using the toilet or changing a diaper. °· Wash fruits and vegetables thoroughly before preparing or serving them. °· Throw out any food that a sick person may have touched. °· Disinfect contaminated surfaces immediately after someone in the household has been sick. Use a bleach-based household cleaner. °· Immediately remove and wash soiled clothes or sheets. °SEEK MEDICAL CARE IF: °· Your vomiting, diarrhea, and stomach pain is getting worse. °· Your symptoms of norovirus do not go away after 2-3 days. °SEEK IMMEDIATE MEDICAL CARE IF:  °You develop symptoms of dehydration that do not improve with fluid replacement. This may include: °· Excessive sleepiness. °· Lack of tears. °· Dry mouth. °· Dizziness when standing. °· Weak pulse. °  °This information is not intended to replace advice given to you by your health care provider. Make sure you discuss any questions you have with your health care provider. °  °Document Released: 01/31/2003 Document Revised: 12/01/2014 Document Reviewed: 04/20/2014 °[employer]vier Interactive Patient Education ©2016 [employer] ° °

## 2015-11-08 NOTE — ED Notes (Signed)
Pt taken out to the car in wheelchair

## 2015-11-08 NOTE — ED Provider Notes (Signed)
[facility] Emergency Department Provider Note     Time seen: ----------------------------------------- 10:10 AM on 11/08/2015 -----------------------------------------    I have reviewed the triage vital signs and the nursing notes.   HISTORY  Chief Complaint No chief complaint on file.    HPI Erin Sutton is a 74 y.o. female who presents to ER with 2 days of nausea vomiting and diarrhea. Patient has not been able to keep her blood pressure medications down, also reports recent upper respiratory infection. Patient is not sure she is taking antibiotics at this time, has not been him to stop throwing up and having diarrhea. Patient denies any fevers chills or other complaints at this time.   Past Medical History  Diagnosis Date  . Hypertension   . Anxiety   . Sleep apnea   . Chronic kidney disease   . Arthritis   . Hypothyroidism   . GERD (gastroesophageal reflux disease)   . Constipation     There are no active problems to display for this patient.   Past Surgical History  Procedure Laterality Date  . Joint replacement Left     knee  . Breast biopsy    . Correction hammer toe Right   . Esophagogastroduodenoscopy (egd) with propofol N/A 07/02/2015    Procedure: ESOPHAGOGASTRODUODENOSCOPY (EGD) WITH PROPOFOL;  Surgeon: Manya Silvas, MD;  Location: [place];  Service: Endoscopy;  Laterality: N/A;  . Colonoscopy with propofol N/A 07/02/2015    Procedure: COLONOSCOPY WITH PROPOFOL;  Surgeon: Manya Silvas, MD;  Location: [facility] ENDOSCOPY;  Service: Endoscopy;  Laterality: N/A;    Allergies Ace inhibitors; Benicar; Amoxicillin; Codeine sulfate; Levaquin; Penicillin v potassium; and Sulfa antibiotics  Social History Social History  Substance Use Topics  . Smoking status: Never Smoker   . Smokeless tobacco: Never Used  . Alcohol Use: No    Review of Systems Constitutional: Negative for fever. Eyes: Negative for visual  changes. ENT: Negative for sore throat. Positive for congestion Cardiovascular: Negative for chest pain. Respiratory: Negative for shortness of breath. Positive for cough Gastrointestinal: Negative for abdominal pain, positive for vomiting and diarrhea Genitourinary: Negative for dysuria. Musculoskeletal: Negative for back pain. Skin: Negative for rash. Neurological: Negative for headaches, focal weakness or numbness.  10-point ROS otherwise negative.  ____________________________________________   PHYSICAL EXAM:  VITAL SIGNS: ED Triage Vitals  Enc Vitals Group     BP --      Pulse --      Resp --      Temp --      Temp src --      SpO2 --      Weight --      Height --      Head Cir --      Peak Flow --      Pain Score --      Pain Loc --      Pain Edu? --      Excl. in [place]? --     Constitutional: Alert and oriented. Mild distress Eyes: Conjunctivae are normal. PERRL. Normal extraocular movements. ENT   Head: Normocephalic and atraumatic.   Nose: No congestion/rhinnorhea.   Mouth/Throat: Mucous membranes are moist.   Neck: No stridor. Cardiovascular: Normal rate, regular rhythm. Normal and symmetric distal pulses are present in all extremities. No murmurs, rubs, or gallops. Respiratory: Normal respiratory effort without tachypnea nor retractions. Breath sounds are clear and equal bilaterally. No wheezes/rales/rhonchi. Gastrointestinal: Soft and nontender. No distention. No abdominal bruits.  Musculoskeletal: Nontender with normal range of motion in all extremities. No joint effusions.  No lower extremity tenderness  Neurologic:  Normal speech and language. No gross focal neurologic deficits are appreciated. Speech is normal. Skin:  Skin is warm, dry and intact. No rash noted. Psychiatric: Mood and affect are normal. Speech and behavior are normal. Patient exhibits appropriate insight and judgment. ____________________________________________  EKG:  Interpreted by me. Sinus rhythm with a rate of 82 bpm, right bundle branch block, wide QRS, normal QT interval.  ____________________________________________  ED COURSE:  Pertinent labs & imaging results that were available during my care of the patient were reviewed by me and considered in my medical decision making (see chart for details). Patient presents with nausea vomiting and diarrhea. This likely viral or secondary to doxycycline which she's been taking plus viral infection. ____________________________________________    LABS (pertinent positives/negatives)  Labs Reviewed  CBC WITH DIFFERENTIAL/PLATELET - Abnormal; Notable for the following:    RDW 15.1 (*)    All other components within normal limits  COMPREHENSIVE METABOLIC PANEL - Abnormal; Notable for the following:    Glucose, Bld 107 (*)    Calcium 10.8 (*)    Total Protein 8.2 (*)    All other components within normal limits  URINALYSIS COMPLETEWITH MICROSCOPIC ([facility] ONLY) - Abnormal; Notable for the following:    Color, Urine COLORLESS (*)    APPearance CLEAR (*)    Specific Gravity, Urine 1.004 (*)    Hgb urine dipstick 2+ (*)    Protein, ur 30 (*)    Leukocytes, UA TRACE (*)    Squamous Epithelial / LPF 0-5 (*)    All other components within normal limits  LIPASE, BLOOD  TROPONIN I    RADIOLOGY Images were viewed by me  CT abdomen and pelvis with contrast IMPRESSION: 1. Moderate sigmoid diverticulosis, with no evidence of acute diverticulitis. No evidence of bowel obstruction or acute bowel inflammation. Normal appendix . 2. Tiny hiatal hernia. Oral contrast in the lower thoracic esophagus, suggesting esophageal dysmotility and/or gastroesophageal reflux. 3. Suggestion of diffuse hepatic steatosis. 4. At least two-vessel coronary atherosclerosis. 5. Myomatous uterus. ____________________________________________  FINAL ASSESSMENT AND PLAN  Vomiting and diarrhea   Plan: Patient with labs and  imaging as dictated above. Symptoms are likely just viral in origin. Her labs look remarkably good other than a mildly elevated calcium level. She was given IV fluids. No acute etiology was found on CT scan. She is encouraged to continue reflux medication as well as antibiotics and have close follow-up with her doctor for reevaluation.   Earleen Newport, MD   Earleen Newport, MD 11/08/15 [phone]

## 2015-11-08 NOTE — ED Notes (Signed)
Iv from august d/c'd out of computer

## 2015-11-08 NOTE — ED Notes (Signed)
Pt to ED via EMS transport from home with c/o n,v,d, cough and congestion x 2 days, pt has HTN and unable to take BP meds due to n,v, also states she has had some chest tightness, denies any chest tightness at this time

## 2016-04-03 DIAGNOSIS — Z96659 Presence of unspecified artificial knee joint: Secondary | ICD-10-CM | POA: Insufficient documentation

## 2016-10-07 ENCOUNTER — Other Ambulatory Visit: Payer: Self-pay | Admitting: Internal Medicine

## 2016-10-07 DIAGNOSIS — Z1231 Encounter for screening mammogram for malignant neoplasm of breast: Secondary | ICD-10-CM

## 2016-11-19 ENCOUNTER — Ambulatory Visit
Admission: RE | Admit: 2016-11-19 | Discharge: 2016-11-19 | Disposition: A | Discharge status: Home / Self Care | Payer: Medicare HMO | Source: Ambulatory Visit | Attending: Internal Medicine | Admitting: Internal Medicine

## 2016-11-19 DIAGNOSIS — Z1231 Encounter for screening mammogram for malignant neoplasm of breast: Secondary | ICD-10-CM | POA: Diagnosis not present

## 2016-11-21 ENCOUNTER — Other Ambulatory Visit: Payer: Self-pay | Admitting: Internal Medicine

## 2016-11-21 DIAGNOSIS — R928 Other abnormal and inconclusive findings on diagnostic imaging of breast: Secondary | ICD-10-CM

## 2016-11-21 DIAGNOSIS — N6310 Unspecified lump in the right breast, unspecified quadrant: Secondary | ICD-10-CM

## 2016-12-03 ENCOUNTER — Ambulatory Visit
Admission: RE | Admit: 2016-12-03 | Discharge: 2016-12-03 | Disposition: A | Discharge status: Home / Self Care | Payer: Medicare HMO | Source: Ambulatory Visit | Attending: Internal Medicine | Admitting: Internal Medicine

## 2016-12-03 DIAGNOSIS — R928 Other abnormal and inconclusive findings on diagnostic imaging of breast: Secondary | ICD-10-CM | POA: Diagnosis not present

## 2016-12-03 DIAGNOSIS — N6314 Unspecified lump in the right breast, lower inner quadrant: Secondary | ICD-10-CM | POA: Diagnosis not present

## 2016-12-03 DIAGNOSIS — N6310 Unspecified lump in the right breast, unspecified quadrant: Secondary | ICD-10-CM

## 2016-12-04 ENCOUNTER — Other Ambulatory Visit: Payer: Self-pay | Admitting: Internal Medicine

## 2016-12-04 DIAGNOSIS — N6001 Solitary cyst of right breast: Secondary | ICD-10-CM

## 2016-12-04 DIAGNOSIS — R928 Other abnormal and inconclusive findings on diagnostic imaging of breast: Secondary | ICD-10-CM

## 2016-12-17 ENCOUNTER — Inpatient Hospital Stay
Admission: EM | Admit: 2016-12-17 | Discharge: 2016-12-21 | DRG: 872 | Disposition: A | Discharge status: Home Health | Payer: Medicare PPO | Attending: Internal Medicine | Admitting: Internal Medicine

## 2016-12-17 ENCOUNTER — Emergency Department: Payer: Medicare PPO

## 2016-12-17 DIAGNOSIS — L03116 Cellulitis of left lower limb: Secondary | ICD-10-CM | POA: Diagnosis present

## 2016-12-17 DIAGNOSIS — Z885 Allergy status to narcotic agent status: Secondary | ICD-10-CM | POA: Diagnosis not present

## 2016-12-17 DIAGNOSIS — Z8249 Family history of ischemic heart disease and other diseases of the circulatory system: Secondary | ICD-10-CM | POA: Diagnosis not present

## 2016-12-17 DIAGNOSIS — E876 Hypokalemia: Secondary | ICD-10-CM | POA: Diagnosis present

## 2016-12-17 DIAGNOSIS — Z96652 Presence of left artificial knee joint: Secondary | ICD-10-CM | POA: Diagnosis present

## 2016-12-17 DIAGNOSIS — Z79899 Other long term (current) drug therapy: Secondary | ICD-10-CM | POA: Diagnosis not present

## 2016-12-17 DIAGNOSIS — I129 Hypertensive chronic kidney disease with stage 1 through stage 4 chronic kidney disease, or unspecified chronic kidney disease: Secondary | ICD-10-CM | POA: Diagnosis present

## 2016-12-17 DIAGNOSIS — A419 Sepsis, unspecified organism: Secondary | ICD-10-CM | POA: Diagnosis present

## 2016-12-17 DIAGNOSIS — G4733 Obstructive sleep apnea (adult) (pediatric): Secondary | ICD-10-CM | POA: Diagnosis present

## 2016-12-17 DIAGNOSIS — M1990 Unspecified osteoarthritis, unspecified site: Secondary | ICD-10-CM | POA: Diagnosis present

## 2016-12-17 DIAGNOSIS — I248 Other forms of acute ischemic heart disease: Secondary | ICD-10-CM | POA: Diagnosis present

## 2016-12-17 DIAGNOSIS — I959 Hypotension, unspecified: Secondary | ICD-10-CM | POA: Diagnosis present

## 2016-12-17 DIAGNOSIS — I2510 Atherosclerotic heart disease of native coronary artery without angina pectoris: Secondary | ICD-10-CM | POA: Diagnosis present

## 2016-12-17 DIAGNOSIS — B950 Streptococcus, group A, as the cause of diseases classified elsewhere: Secondary | ICD-10-CM | POA: Diagnosis present

## 2016-12-17 DIAGNOSIS — R69 Illness, unspecified: Secondary | ICD-10-CM

## 2016-12-17 DIAGNOSIS — R609 Edema, unspecified: Secondary | ICD-10-CM

## 2016-12-17 DIAGNOSIS — Z880 Allergy status to penicillin: Secondary | ICD-10-CM

## 2016-12-17 DIAGNOSIS — Z881 Allergy status to other antibiotic agents status: Secondary | ICD-10-CM

## 2016-12-17 DIAGNOSIS — Z888 Allergy status to other drugs, medicaments and biological substances status: Secondary | ICD-10-CM | POA: Diagnosis not present

## 2016-12-17 DIAGNOSIS — Z7982 Long term (current) use of aspirin: Secondary | ICD-10-CM | POA: Diagnosis not present

## 2016-12-17 DIAGNOSIS — F419 Anxiety disorder, unspecified: Secondary | ICD-10-CM | POA: Diagnosis present

## 2016-12-17 DIAGNOSIS — E039 Hypothyroidism, unspecified: Secondary | ICD-10-CM | POA: Diagnosis present

## 2016-12-17 DIAGNOSIS — Z882 Allergy status to sulfonamides status: Secondary | ICD-10-CM | POA: Diagnosis not present

## 2016-12-17 DIAGNOSIS — N189 Chronic kidney disease, unspecified: Secondary | ICD-10-CM | POA: Diagnosis present

## 2016-12-17 DIAGNOSIS — A409 Streptococcal sepsis, unspecified: Secondary | ICD-10-CM | POA: Diagnosis not present

## 2016-12-17 DIAGNOSIS — K219 Gastro-esophageal reflux disease without esophagitis: Secondary | ICD-10-CM | POA: Diagnosis present

## 2016-12-17 DIAGNOSIS — M729 Fibroblastic disorder, unspecified: Secondary | ICD-10-CM

## 2016-12-17 DIAGNOSIS — J111 Influenza due to unidentified influenza virus with other respiratory manifestations: Secondary | ICD-10-CM

## 2016-12-17 LAB — URINALYSIS, COMPLETE (UACMP) WITH MICROSCOPIC
Bacteria, UA: NONE SEEN
Bilirubin Urine: NEGATIVE
Glucose, UA: NEGATIVE mg/dL
Hgb urine dipstick: NEGATIVE
Ketones, ur: NEGATIVE mg/dL
Leukocytes, UA: NEGATIVE
Nitrite: NEGATIVE
Protein, ur: >=300 mg/dL — AB
Specific Gravity, Urine: 1.009 (ref 1.005–1.030)
pH: 8 (ref 5.0–8.0)

## 2016-12-17 LAB — COMPREHENSIVE METABOLIC PANEL
ALT: 26 U/L (ref 14–54)
AST: 56 U/L — ABNORMAL HIGH (ref 15–41)
Albumin: 4.5 g/dL (ref 3.5–5.0)
Alkaline Phosphatase: 59 U/L (ref 38–126)
Anion gap: 11 (ref 5–15)
BUN: 11 mg/dL (ref 6–20)
CO2: 27 mmol/L (ref 22–32)
Calcium: 9.4 mg/dL (ref 8.9–10.3)
Chloride: 97 mmol/L — ABNORMAL LOW (ref 101–111)
Creatinine, Ser: 0.88 mg/dL (ref 0.44–1.00)
GFR calc Af Amer: >60 mL/min (ref >60)
GFR calc non Af Amer: >60 mL/min (ref >60)
Glucose, Bld: 139 mg/dL — ABNORMAL HIGH (ref 65–99)
Potassium: 3.1 mmol/L — ABNORMAL LOW (ref 3.5–5.1)
Sodium: 135 mmol/L (ref 135–145)
Total Bilirubin: 0.9 mg/dL (ref 0.3–1.2)
Total Protein: 8.5 g/dL — ABNORMAL HIGH (ref 6.5–8.1)

## 2016-12-17 LAB — CBC WITH DIFFERENTIAL/PLATELET
Basophils Absolute: 0.1 10*3/uL (ref 0–0.1)
Basophils Relative: 1 %
Eosinophils Absolute: 0 10*3/uL (ref 0–0.7)
Eosinophils Relative: 0 %
HCT: 37.4 % (ref 35.0–47.0)
Hemoglobin: 12.7 g/dL (ref 12.0–16.0)
Lymphocytes Relative: 2 %
Lymphs Abs: 0.4 10*3/uL — ABNORMAL LOW (ref 1.0–3.6)
MCH: 29 pg (ref 26.0–34.0)
MCHC: 33.9 g/dL (ref 32.0–36.0)
MCV: 85.5 fL (ref 80.0–100.0)
Monocytes Absolute: 1.2 10*3/uL — ABNORMAL HIGH (ref 0.2–0.9)
Monocytes Relative: 5 %
Neutro Abs: 24.4 10*3/uL — ABNORMAL HIGH (ref 1.4–6.5)
Neutrophils Relative %: 92 %
Platelets: 244 10*3/uL (ref 150–440)
RBC: 4.37 MIL/uL (ref 3.80–5.20)
RDW: 16.2 % — ABNORMAL HIGH (ref 11.5–14.5)
WBC: 26.2 10*3/uL — ABNORMAL HIGH (ref 3.6–11.0)

## 2016-12-17 LAB — LACTIC ACID, PLASMA: Lactic Acid, Venous: 1.2 mmol/L (ref 0.5–1.9)

## 2016-12-17 LAB — PROTIME-INR
INR: 1.2
Prothrombin Time: 15.3 seconds — ABNORMAL HIGH (ref 11.4–15.2)

## 2016-12-17 LAB — MAGNESIUM: Magnesium: 1.6 mg/dL — ABNORMAL LOW (ref 1.7–2.4)

## 2016-12-17 LAB — INFLUENZA PANEL BY PCR (TYPE A & B)
Influenza A By PCR: NEGATIVE
Influenza B By PCR: NEGATIVE

## 2016-12-17 LAB — POCT RAPID STREP A: Streptococcus, Group A Screen (Direct): NEGATIVE

## 2016-12-17 LAB — APTT: aPTT: 35 seconds (ref 24–36)

## 2016-12-17 LAB — TROPONIN I
Troponin I: 0.05 ng/mL (ref <0.03)
Troponin I: <0.03 ng/mL (ref <0.03)

## 2016-12-17 LAB — PROCALCITONIN: Procalcitonin: 7.02 ng/mL

## 2016-12-17 MED ORDER — ENOXAPARIN SODIUM 40 MG/0.4ML ~~LOC~~ SOLN
40.0000 mg | SUBCUTANEOUS | Status: DC
  Administered 2016-12-17 – 2016-12-20 (×4): 40 mg via SUBCUTANEOUS
  Filled 2016-12-17 (×4): qty 0.4

## 2016-12-17 MED ORDER — ONDANSETRON HCL 4 MG PO TABS: 4.0000 mg | ORAL_TABLET | Freq: Four times a day (QID) | ORAL | Status: DC | PRN

## 2016-12-17 MED ORDER — POTASSIUM CHLORIDE CRYS ER 20 MEQ PO TBCR
40.0000 meq | EXTENDED_RELEASE_TABLET | Freq: Once | ORAL | Status: AC
  Administered 2016-12-17: 40 meq via ORAL
  Filled 2016-12-17: qty 2

## 2016-12-17 MED ORDER — ACETAMINOPHEN 650 MG RE SUPP: 650.0000 mg | Freq: Four times a day (QID) | RECTAL | Status: DC | PRN

## 2016-12-17 MED ORDER — CEFTRIAXONE SODIUM 1 G IJ SOLR: 1.0000 g | Freq: Once | INTRAMUSCULAR | Status: DC

## 2016-12-17 MED ORDER — SERTRALINE HCL 50 MG PO TABS
100.0000 mg | ORAL_TABLET | Freq: Every day | ORAL | Status: DC
  Administered 2016-12-18 – 2016-12-20 (×3): 100 mg via ORAL
  Filled 2016-12-17 (×4): qty 2

## 2016-12-17 MED ORDER — AZTREONAM 2 G IJ SOLR
2.0000 g | Freq: Three times a day (TID) | INTRAMUSCULAR | Status: DC
Start: 2016-12-18 — End: 2016-12-17
  Filled 2016-12-17: qty 2

## 2016-12-17 MED ORDER — LEVOTHYROXINE SODIUM 50 MCG PO TABS
50.0000 ug | ORAL_TABLET | Freq: Every day | ORAL | Status: DC
  Administered 2016-12-18 – 2016-12-21 (×4): 50 ug via ORAL
  Filled 2016-12-17 (×4): qty 1

## 2016-12-17 MED ORDER — PANTOPRAZOLE SODIUM 40 MG PO TBEC
40.0000 mg | DELAYED_RELEASE_TABLET | Freq: Every day | ORAL | Status: DC
  Administered 2016-12-18 – 2016-12-21 (×4): 40 mg via ORAL
  Filled 2016-12-17 (×5): qty 1

## 2016-12-17 MED ORDER — POTASSIUM CHLORIDE CRYS ER 20 MEQ PO TBCR
20.0000 meq | EXTENDED_RELEASE_TABLET | Freq: Every morning | ORAL | Status: DC
  Administered 2016-12-18 – 2016-12-21 (×4): 20 meq via ORAL
  Filled 2016-12-17 (×4): qty 1

## 2016-12-17 MED ORDER — SODIUM CHLORIDE 0.9 % IV SOLN
Freq: Once | INTRAVENOUS | Status: AC
  Administered 2016-12-17: 15:00:00 via INTRAVENOUS

## 2016-12-17 MED ORDER — DEXTROSE 5 % IV SOLN
500.0000 mg | Freq: Once | INTRAVENOUS | Status: AC
  Administered 2016-12-17: 500 mg via INTRAVENOUS
  Filled 2016-12-17: qty 500

## 2016-12-17 MED ORDER — DEXTROSE 5 % IV SOLN
2.0000 g | Freq: Once | INTRAVENOUS | Status: AC
  Administered 2016-12-17: 20:00:00 2 g via INTRAVENOUS
  Filled 2016-12-17: qty 2

## 2016-12-17 MED ORDER — PRAVASTATIN SODIUM 20 MG PO TABS
40.0000 mg | ORAL_TABLET | Freq: Every day | ORAL | Status: DC
  Administered 2016-12-18 – 2016-12-20 (×3): 40 mg via ORAL
  Filled 2016-12-17 (×3): qty 2

## 2016-12-17 MED ORDER — SODIUM CHLORIDE 0.9 % IV SOLN
INTRAVENOUS | Status: DC
  Administered 2016-12-17 – 2016-12-18 (×2): via INTRAVENOUS

## 2016-12-17 MED ORDER — ALBUTEROL SULFATE (2.5 MG/3ML) 0.083% IN NEBU: 2.5000 mg | INHALATION_SOLUTION | RESPIRATORY_TRACT | Status: DC | PRN

## 2016-12-17 MED ORDER — BISACODYL 5 MG PO TBEC: 5.0000 mg | DELAYED_RELEASE_TABLET | Freq: Every day | ORAL | Status: DC | PRN

## 2016-12-17 MED ORDER — CEFTRIAXONE SODIUM-DEXTROSE 1-3.74 GM-% IV SOLR: 1.0000 g | Freq: Once | INTRAVENOUS | Status: DC

## 2016-12-17 MED ORDER — ACETAMINOPHEN 325 MG PO TABS: 650.0000 mg | ORAL_TABLET | Freq: Four times a day (QID) | ORAL | Status: DC | PRN

## 2016-12-17 MED ORDER — VANCOMYCIN HCL IN DEXTROSE 1-5 GM/200ML-% IV SOLN
1000.0000 mg | INTRAVENOUS | Status: DC
  Administered 2016-12-18 – 2016-12-19 (×2): 1000 mg via INTRAVENOUS
  Filled 2016-12-17 (×3): qty 200

## 2016-12-17 MED ORDER — DEXTROSE 5 % IV SOLN
2.0000 g | Freq: Three times a day (TID) | INTRAVENOUS | Status: DC
  Administered 2016-12-18: 05:00:00 2 g via INTRAVENOUS
  Filled 2016-12-17 (×3): qty 2

## 2016-12-17 MED ORDER — SENNOSIDES-DOCUSATE SODIUM 8.6-50 MG PO TABS: 1.0000 | ORAL_TABLET | Freq: Every evening | ORAL | Status: DC | PRN

## 2016-12-17 MED ORDER — ASPIRIN EC 81 MG PO TBEC
81.0000 mg | DELAYED_RELEASE_TABLET | Freq: Every day | ORAL | Status: DC
  Administered 2016-12-18 – 2016-12-21 (×4): 81 mg via ORAL
  Filled 2016-12-17 (×5): qty 1

## 2016-12-17 MED ORDER — MAGNESIUM OXIDE 400 (241.3 MG) MG PO TABS
400.0000 mg | ORAL_TABLET | Freq: Every day | ORAL | Status: DC
  Administered 2016-12-18 – 2016-12-21 (×4): 400 mg via ORAL
  Filled 2016-12-17 (×4): qty 1

## 2016-12-17 MED ORDER — ONDANSETRON HCL 4 MG/2ML IJ SOLN
4.0000 mg | Freq: Four times a day (QID) | INTRAMUSCULAR | Status: DC | PRN
  Administered 2016-12-17: 4 mg via INTRAVENOUS
  Filled 2016-12-17: qty 2

## 2016-12-17 MED ORDER — LORAZEPAM 2 MG PO TABS
2.0000 mg | ORAL_TABLET | Freq: Four times a day (QID) | ORAL | Status: DC | PRN
  Administered 2016-12-18 – 2016-12-20 (×3): 2 mg via ORAL
  Filled 2016-12-17 (×3): qty 1

## 2016-12-17 MED ORDER — VANCOMYCIN HCL IN DEXTROSE 1-5 GM/200ML-% IV SOLN
1000.0000 mg | Freq: Once | INTRAVENOUS | Status: AC
  Administered 2016-12-17: 1000 mg via INTRAVENOUS
  Filled 2016-12-17: qty 200

## 2016-12-17 NOTE — ED Triage Notes (Signed)
Pt bib EMS from home w/ c/o emesis, gen body aches, and fever that began today at 0300.  Pt alert and oriented.  Denies CP, SOB or LOC.  Pt denies pain at this time.  Pt sts that she is unable to eat/drink.  4 mg IM given by EMS and 400 mg children's motrin.

## 2016-12-17 NOTE — Consult Note (Signed)
Pharmacy Antibiotic Note  Erin Sutton is a 76 y.o. female admitted on 12/17/2016 with sepsis.  Pharmacy has been consulted for aztreonam and vancomycin dosing.  Plan: Pt received 1g of vancomycin. Give next dose in 8 hours for stacked dosing Vancomycin 1000mg  IV every 24 hours.  Goal trough 15-20 mcg/mL.  Trough prior to the 5th dose 1/28 @ 0230 Aztreonam 2g q 8 hours Pt has rash to levofloxacin, amoxicillin, PNC, and sulfa  Height: 5' (152.4 cm) Weight: 173 lb (78.5 kg) IBW/kg (Calculated) : 45.5  Temp (24hrs), Avg:99.3 F (37.4 C), Min:98.5 F (36.9 C), Max:100 F (37.8 C)   Recent Labs Lab 12/17/16 1328 12/17/16 1816  WBC 26.2*  --   CREATININE 0.88  --   LATICACIDVEN  --  1.2    Estimated Creatinine Clearance: 51.2 mL/min (by C-G formula based on SCr of 0.88 mg/dL).    Allergies  Allergen Reactions  . Ace Inhibitors Cough  . Benicar [Olmesartan]   . Amoxicillin Rash  . Codeine Sulfate Rash  . Levaquin [Levofloxacin In D5w] Rash  . Penicillin V Potassium Rash  . Sulfa Antibiotics Rash    Antimicrobials this admission: vancomycin 1/24 >>  azretonam 1/24 >>  Azithromycin 1/24>> one dose  Dose adjustments this admission:   Microbiology results: 1/24 BCx:  1/24 UCx:     Thank you for allowing pharmacy to be a part of this patient's care.  Ramond Dial, Pharm.D, BCPS Clinical Pharmacist  12/17/2016 9:29 PM

## 2016-12-17 NOTE — H&P (Signed)
[facility] [place] NAME: Erin Sutton    MR#:  [id number]  DATE OF BIRTH:  01/08/1941  DATE OF ADMISSION:  12/17/2016  PRIMARY CARE PHYSICIAN: Madelyn Brunner, MD   REQUESTING/REFERRING PHYSICIAN: Nena Polio, MD  CHIEF COMPLAINT:   Chief Complaint  Patient presents with  . Influenza   Nausea vomiting today. HISTORY OF PRESENT ILLNESS:  Erin Sutton  is a 76 y.o. female with a known history of Hypertension, CKD, GERD, hypothyroidism and sleep apnea. The patient is started to have nausea and vomiting multiple times since 3 AM today. She also has a fever and chills, cough, generalized weakness and muscle aches. She was found tachycardia, hypotension and leukocytosis in the ED. Chest x-ray didn't show any pneumonia, influenza test are negative and urinalysis is normal.  PAST MEDICAL HISTORY:   Past Medical History:  Diagnosis Date  . Anxiety   . Arthritis   . Chronic kidney disease   . Constipation   . GERD (gastroesophageal reflux disease)   . Hypertension   . Hypothyroidism   . Sleep apnea     PAST SURGICAL HISTORY:   Past Surgical History:  Procedure Laterality Date  . BREAST EXCISIONAL BIOPSY Right    neg  . COLONOSCOPY WITH PROPOFOL N/A 07/02/2015   Procedure: COLONOSCOPY WITH PROPOFOL;  Surgeon: Manya Silvas, MD;  Location: [facility] ENDOSCOPY;  Service: Endoscopy;  Laterality: N/A;  . CORRECTION HAMMER TOE Right   . ESOPHAGOGASTRODUODENOSCOPY (EGD) WITH PROPOFOL N/A 07/02/2015   Procedure: ESOPHAGOGASTRODUODENOSCOPY (EGD) WITH PROPOFOL;  Surgeon: Manya Silvas, MD;  Location: [facility] ENDOSCOPY;  Service: Endoscopy;  Laterality: N/A;  . JOINT REPLACEMENT Left    knee    SOCIAL HISTORY:   Social History  Substance Use Topics  . Smoking status: Never Smoker  . Smokeless tobacco: Never Used  . Alcohol use No    FAMILY HISTORY:   Family History  Problem Relation Age of Onset  . Hypertension Mother   .  Diabetes Mother   . Heart attack Mother   . Asthma Mother   . Peripheral vascular disease Mother   . Heart attack Father   . Hypertension Father   . Diabetes Father     DRUG ALLERGIES:   Allergies  Allergen Reactions  . Ace Inhibitors Cough  . Benicar [Olmesartan]   . Amoxicillin Rash  . Codeine Sulfate Rash  . Levaquin [Levofloxacin In D5w] Rash  . Penicillin V Potassium Rash  . Sulfa Antibiotics Rash    REVIEW OF SYSTEMS:   Review of Systems  Constitutional: Positive for chills, fever and malaise/fatigue.  HENT: Negative for congestion.   Eyes: Negative for blurred vision and double vision.  Respiratory: Positive for cough. Negative for hemoptysis, sputum production, shortness of breath, wheezing and stridor.   Cardiovascular: Negative for chest pain and leg swelling.  Gastrointestinal: Positive for nausea and vomiting. Negative for abdominal pain, blood in stool, diarrhea and melena.  Genitourinary: Positive for frequency. Negative for dysuria.  Musculoskeletal: Negative for back pain.  Skin: Negative for itching and rash.  Neurological: Positive for weakness. Negative for dizziness, focal weakness, loss of consciousness and headaches.  Psychiatric/Behavioral: Negative for depression. The patient is not nervous/anxious.     MEDICATIONS AT HOME:   Prior to Admission medications   Medication Sig Start Date End Date Taking? Authorizing Provider  amLODipine (NORVASC) 10 MG tablet Take 10 mg by mouth daily.   Yes Historical Provider, MD  aspirin EC 81 MG tablet Take 81 mg by mouth daily.   Yes Historical Provider, MD  isosorbide mononitrate (IMDUR) 30 MG 24 hr tablet Take 30 mg by mouth daily.   Yes Historical Provider, MD  levothyroxine (SYNTHROID, LEVOTHROID) 50 MCG tablet Take 50 mcg by mouth daily before breakfast.   Yes Historical Provider, MD  LORazepam (ATIVAN) 2 MG tablet Take 2 mg by mouth every 8 (eight) hours as needed for anxiety.    Yes Historical Provider, MD   magnesium oxide (MAG-OX) 400 MG tablet Take 400 mg by mouth daily.   Yes Historical Provider, MD  Multiple Vitamin-Folic Acid TABS Take by mouth every morning.   Yes Historical Provider, MD  Multiple Vitamins-Minerals (CENTRUM SILVER 50+WOMEN) TABS Take 1 tablet by mouth daily.   Yes Historical Provider, MD  ondansetron (ZOFRAN) 4 MG tablet Take 1 tablet (4 mg total) by mouth daily as needed for nausea or vomiting. 11/08/15  Yes Earleen Newport, MD  pantoprazole (PROTONIX) 40 MG tablet Take 40 mg by mouth daily.   Yes Historical Provider, MD  potassium chloride SA (K-DUR,KLOR-CON) 20 MEQ tablet Take 20 mEq by mouth every morning.   Yes Historical Provider, MD  pravastatin (PRAVACHOL) 40 MG tablet Take 40 mg by mouth daily.   Yes Historical Provider, MD  sertraline (ZOLOFT) 100 MG tablet Take 100 mg by mouth daily.   Yes Historical Provider, MD      VITAL SIGNS:  Blood pressure 99/65, pulse 91, temperature 99.4 F (37.4 C), temperature source Oral, resp. rate 16, SpO2 97 %.  PHYSICAL EXAMINATION:  Physical Exam  GENERAL:  76 y.o.-year-old patient lying in the bed with no acute distress.  EYES: Pupils equal, round, reactive to light and accommodation. No scleral icterus. Extraocular muscles intact.  HEENT: Head atraumatic, normocephalic. Oropharynx and nasopharynx clear. Dry oral mucosa. NECK:  Supple, no jugular venous distention. No thyroid enlargement, no tenderness.  LUNGS: Normal breath sounds bilaterally, no wheezing, rales,rhonchi or crepitation. No use of accessory muscles of respiration.  CARDIOVASCULAR: S1, S2 normal. No murmurs, rubs, or gallops.  ABDOMEN: Soft, nontender, nondistended. Bowel sounds present. No organomegaly or mass.  EXTREMITIES: No pedal edema, cyanosis, or clubbing.  NEUROLOGIC: Cranial nerves II through XII are intact. Muscle strength 5/5 in all extremities. Sensation intact. Gait not checked.  PSYCHIATRIC: The patient is alert and oriented x 3.  SKIN:  No obvious rash, lesion, or ulcer.   LABORATORY PANEL:   CBC  Recent Labs Lab 12/17/16 1328  WBC 26.2*  HGB 12.7  HCT 37.4  PLT 244   ------------------------------------------------------------------------------------------------------------------  Chemistries   Recent Labs Lab 12/17/16 1328  NA 135  K 3.1*  CL 97*  CO2 27  GLUCOSE 139*  BUN 11  CREATININE 0.88  CALCIUM 9.4  AST 56*  ALT 26  ALKPHOS 59  BILITOT 0.9   ------------------------------------------------------------------------------------------------------------------  Cardiac Enzymes  Recent Labs Lab 12/17/16 1328  TROPONINI 0.05*   ------------------------------------------------------------------------------------------------------------------  RADIOLOGY:  Dg Chest 2 View  Result Date: 12/17/2016 CLINICAL DATA:  Fever and leukocytosis. EXAM: CHEST  2 VIEW COMPARISON:  11/03/2015 FINDINGS: There is no edema, consolidation, effusion, or pneumothorax. Normal heart size and mediastinal contours. Aortic arch atherosclerosis. 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 IMPRESSION: No active cardiopulmonary disease. Electronically Signed   By: Monte Fantasia M.D.   On: 12/17/2016 15:01      IMPRESSION AND PLAN:   Sepsis (tachycardia, leukocytosis and hypotension), Unclear etiology. The patient will be admitted to  medical floor. Start Azactam and vancomycin due to penicillin allergy.Robitussin when necessary for cough. Follow-up cultures and CBC.  Hypotension. Hold home hypertension medication. Give normal saline IV. Hypokalemia. Give potassium supplement, follow-up BMP and magnesium level. Elevated troponin. Unclear etiology, possible due to sepsis. Continue aspirin and Pravachol, follow-up troponin level. The patient has no complaints of any chest pain.   All the records are reviewed and case discussed with ED provider. Management plans discussed with the  patient, family and they are in agreement.  CODE STATUS: Full code.  TOTAL TIME TAKING CARE OF THIS PATIENT: 53 minutes.    Demetrios Loll M.D on 12/17/2016 at 5:13 PM  Between 7am to 6pm - Pager - [phone]  After 6pm go to [email or web address] - Proofreader  [facility] [place] [facility]  Office  [phone]  CC: Primary care physician; Madelyn Brunner, MD   Note: This dictation was prepared with Dragon dictation along with smaller phrase technology. Any transcriptional errors that result from this process are unintentional.

## 2016-12-17 NOTE — ED Provider Notes (Signed)
[facility] Emergency Department Provider Note   ____________________________________________   First MD Initiated Contact with Patient 12/17/16 1435     (approximate)  I have reviewed the triage vital signs and the nursing notes.   HISTORY  Chief Complaint Influenza   HPI Erin Sutton is a 76 y.o. female she reports since about 3 this morning she's been having nausea and vomiting she's been unable to keep anything down. She's had a fever. She is had generalized aches and pains in her especially in her arms legs or muscles. She's not been coughing she's not had abdominal pain she's not had diarrhea. She just felt ill.  Past Medical History:  Diagnosis Date  . Anxiety   . Arthritis   . Chronic kidney disease   . Constipation   . GERD (gastroesophageal reflux disease)   . Hypertension   . Hypothyroidism   . Sleep apnea     Patient Active Problem List   Diagnosis Date Noted  . Sepsis ([place]) 12/17/2016    Past Surgical History:  Procedure Laterality Date  . BREAST EXCISIONAL BIOPSY Right    neg  . COLONOSCOPY WITH PROPOFOL N/A 07/02/2015   Procedure: COLONOSCOPY WITH PROPOFOL;  Surgeon: Manya Silvas, MD;  Location: [facility] ENDOSCOPY;  Service: Endoscopy;  Laterality: N/A;  . CORRECTION HAMMER TOE Right   . ESOPHAGOGASTRODUODENOSCOPY (EGD) WITH PROPOFOL N/A 07/02/2015   Procedure: ESOPHAGOGASTRODUODENOSCOPY (EGD) WITH PROPOFOL;  Surgeon: Manya Silvas, MD;  Location: [facility] ENDOSCOPY;  Service: Endoscopy;  Laterality: N/A;  . JOINT REPLACEMENT Left    knee    Prior to Admission medications   Medication Sig Start Date End Date Taking? Authorizing Provider  amLODipine (NORVASC) 10 MG tablet Take 10 mg by mouth daily.   Yes Historical Provider, MD  aspirin EC 81 MG tablet Take 81 mg by mouth daily.   Yes Historical Provider, MD  isosorbide mononitrate (IMDUR) 30 MG 24 hr tablet Take 30 mg by mouth daily.   Yes Historical Provider, MD    levothyroxine (SYNTHROID, LEVOTHROID) 50 MCG tablet Take 50 mcg by mouth daily before breakfast.   Yes Historical Provider, MD  LORazepam (ATIVAN) 2 MG tablet Take 2 mg by mouth every 6 (six) hours as needed for anxiety.   Yes Historical Provider, MD  magnesium oxide (MAG-OX) 400 MG tablet Take 400 mg by mouth daily.   Yes Historical Provider, MD  Multiple Vitamin-Folic Acid TABS Take by mouth every morning.   Yes Historical Provider, MD  Multiple Vitamins-Minerals (CENTRUM SILVER 50+WOMEN) TABS Take 1 tablet by mouth daily.   Yes Historical Provider, MD  ondansetron (ZOFRAN) 4 MG tablet Take 1 tablet (4 mg total) by mouth daily as needed for nausea or vomiting. 11/08/15  Yes Earleen Newport, MD  pantoprazole (PROTONIX) 40 MG tablet Take 40 mg by mouth daily.   Yes Historical Provider, MD  potassium chloride SA (K-DUR,KLOR-CON) 20 MEQ tablet Take 20 mEq by mouth every morning.   Yes Historical Provider, MD  pravastatin (PRAVACHOL) 40 MG tablet Take 40 mg by mouth daily.   Yes Historical Provider, MD  sertraline (ZOLOFT) 100 MG tablet Take 100 mg by mouth daily.   Yes Historical Provider, MD    Allergies Ace inhibitors; Benicar [olmesartan]; Amoxicillin; Codeine sulfate; Levaquin [levofloxacin in d5w]; Penicillin v potassium; and Sulfa antibiotics  Family History  Problem Relation Age of Onset  . Hypertension Mother   . Diabetes Mother   . Heart attack Mother   .  Asthma Mother   . Peripheral vascular disease Mother   . Heart attack Father   . Hypertension Father   . Diabetes Father     Social History Social History  Substance Use Topics  . Smoking status: Never Smoker  . Smokeless tobacco: Never Used  . Alcohol use No    Review of Systems Constitutional:  fever/chills Eyes: No visual changes. ENT: No sore throat. Cardiovascular: Denies chest pain. Respiratory: Denies shortness of breath. Gastrointestinal: No abdominal pain.   nausea,  vomiting.  No diarrhea.  No  constipation. Genitourinary: Negative for dysuria. Musculoskeletal: Negative for back pain. Skin: Negative for rash. Neurological: Negative for headaches, focal weakness or numbness.  10-point ROS otherwise negative.  ____________________________________________   PHYSICAL EXAM:  VITAL SIGNS: ED Triage Vitals [12/17/16 1331]  Enc Vitals Group     BP 137/83     Pulse Rate (!) 102     Resp 20     Temp 100 F (37.8 C)     Temp Source Oral     SpO2 94 %     Weight      Height      Head Circumference      Peak Flow      Pain Score 0     Pain Loc      Pain Edu?      Excl. in [place]?     Constitutional: Alert and oriented. Well appearing and in no acute distress. Eyes: Conjunctivae are normal. PERRL. EOMI. Head: Atraumatic. Nose: No congestion/rhinnorhea. Mouth/Throat: Mucous membranes are moist.  Oropharynx non-erythematous. Neck: No stridor.  Cardiovascular: Normal rate, regular rhythm. Grossly normal heart sounds.  Good peripheral circulation. Respiratory: Normal respiratory effort.  No retractions. Lungs CTAB. Gastrointestinal: Soft and nontender. No distention. No abdominal bruits. No CVA tenderness. Musculoskeletal: No lower extremity tenderness nor edema.  No joint effusions. Neurologic:  Normal speech and language. No gross focal neurologic deficits are appreciated. Skin:  Skin is warm, dry and intact. No rash noted.  ____________________________________________   LABS (all labs ordered are listed, but only abnormal results are displayed)  Labs Reviewed  COMPREHENSIVE METABOLIC PANEL - Abnormal; Notable for the following:       Result Value   Potassium 3.1 (*)    Chloride 97 (*)    Glucose, Bld 139 (*)    Total Protein 8.5 (*)    AST 56 (*)    All other components within normal limits  CBC WITH DIFFERENTIAL/PLATELET - Abnormal; Notable for the following:    WBC 26.2 (*)    RDW 16.2 (*)    Neutro Abs 24.4 (*)    Lymphs Abs 0.4 (*)    Monocytes Absolute 1.2  (*)    All other components within normal limits  URINALYSIS, COMPLETE (UACMP) WITH MICROSCOPIC - Abnormal; Notable for the following:    Color, Urine YELLOW (*)    APPearance CLEAR (*)    Protein, ur >=300 (*)    Squamous Epithelial / LPF 0-5 (*)    All other components within normal limits  TROPONIN I - Abnormal; Notable for the following:    Troponin I 0.05 (*)    All other components within normal limits  CULTURE, BLOOD (ROUTINE X 2)  CULTURE, BLOOD (ROUTINE X 2)  URINE CULTURE  INFLUENZA PANEL BY PCR (TYPE A & B)  POCT RAPID STREP A   ____________________________________________  EKG  EKG read and interpreted by me shows normal sinus rhythm rate of 91 normal axis right  bundle branch block. His reading acute MI this is not the case there is ST elevation in lead 3 but only in lead 3 the patient's having no chest pain. ____________________________________________  RADIOLOGY  Study Result   CLINICAL DATA:  Fever and leukocytosis.  EXAM: CHEST  2 VIEW  COMPARISON:  11/03/2015  FINDINGS: There is no edema, consolidation, effusion, or pneumothorax. Normal heart size and mediastinal contours. Aortic arch atherosclerosis. 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  IMPRESSION: No active cardiopulmonary disease.   Electronically Signed   By: Monte Fantasia M.D.   On: 12/17/2016 15:01     ____________________________________________   PROCEDURES  Procedure(s) performed:  Procedures  Critical Care performed:   ____________________________________________   INITIAL IMPRESSION / ASSESSMENT AND PLAN / ED COURSE  Pertinent labs & imaging results that were available during my care of the patient were reviewed by me and considered in my medical decision making (see chart for details).        ____________________________________________   FINAL CLINICAL IMPRESSION(S) / ED DIAGNOSES  Final diagnoses:    Influenza-like illness      NEW MEDICATIONS STARTED DURING THIS VISIT:  New Prescriptions   No medications on file     Note:  This document was prepared using Dragon voice recognition software and may include unintentional dictation errors.    Nena Polio, MD 12/17/16 [phone]

## 2016-12-18 ENCOUNTER — Inpatient Hospital Stay: Payer: Medicare PPO

## 2016-12-18 ENCOUNTER — Ambulatory Visit
Admission: RE | Admit: 2016-12-18 | Discharge: 2016-12-18 | Disposition: A | Discharge status: Home / Self Care | Payer: Medicare HMO | Source: Ambulatory Visit | Attending: Internal Medicine | Admitting: Internal Medicine

## 2016-12-18 ENCOUNTER — Encounter: Payer: Self-pay | Admitting: Radiology

## 2016-12-18 LAB — CBC
HCT: 34 % — ABNORMAL LOW (ref 35.0–47.0)
Hemoglobin: 11.4 g/dL — ABNORMAL LOW (ref 12.0–16.0)
MCH: 29.1 pg (ref 26.0–34.0)
MCHC: 33.6 g/dL (ref 32.0–36.0)
MCV: 86.7 fL (ref 80.0–100.0)
Platelets: 208 10*3/uL (ref 150–440)
RBC: 3.92 MIL/uL (ref 3.80–5.20)
RDW: 16.1 % — ABNORMAL HIGH (ref 11.5–14.5)
WBC: 21.3 10*3/uL — ABNORMAL HIGH (ref 3.6–11.0)

## 2016-12-18 LAB — BLOOD CULTURE ID PANEL (REFLEXED)

## 2016-12-18 LAB — URINE CULTURE

## 2016-12-18 MED ORDER — MAGNESIUM SULFATE 4 GM/100ML IV SOLN
4.0000 g | Freq: Once | INTRAVENOUS | Status: AC
  Administered 2016-12-18: 11:00:00 4 g via INTRAVENOUS
  Filled 2016-12-18 (×2): qty 100

## 2016-12-18 MED ORDER — POTASSIUM CHLORIDE IN NACL 20-0.9 MEQ/L-% IV SOLN
INTRAVENOUS | Status: DC
  Administered 2016-12-18 – 2016-12-19 (×2): via INTRAVENOUS
  Filled 2016-12-18 (×4): qty 1000

## 2016-12-18 MED ORDER — ORAL CARE MOUTH RINSE
15.0000 mL | Freq: Two times a day (BID) | OROMUCOSAL | Status: DC
  Administered 2016-12-18 – 2016-12-21 (×6): 15 mL via OROMUCOSAL

## 2016-12-18 MED ORDER — IOPAMIDOL (ISOVUE-300) INJECTION 61%
100.0000 mL | Freq: Once | INTRAVENOUS | Status: AC | PRN
  Administered 2016-12-18: 11:00:00 100 mL via INTRAVENOUS

## 2016-12-18 MED ORDER — HYDROCODONE-ACETAMINOPHEN 5-325 MG PO TABS: 1.0000 | ORAL_TABLET | ORAL | Status: DC | PRN

## 2016-12-18 NOTE — Progress Notes (Signed)
[place] [facility] [place] NAME: Erin Sutton    MR#:  [id number]  DATE OF BIRTH:  21-Feb-1941  SUBJECTIVE:  CHIEF COMPLAINT:   Chief Complaint  Patient presents with  . Influenza   Patient is 76 year old female with past medical history significant for history of hypertension, CAD, gastroesophageal reflux disease, hypothyroidism, obstructive sleep apnea, who presented to the hospital with complaints of nausea, vomiting, generalized weakness, muscle aches, fever and chills, cough. On arrival to the hospital, she was found to have tachycardia,  hypotension, leukocytosis. Her chest x-ray, urinalysis were unremarkable. She was noted to have left lower extremity cellulitis, no complaints of significant calf tenderness, pain on the left side, spreading and erythema. She feels overall comfortable except of left lower extremity pain. She is afebrile. Blood cultures are growing Streptococcus pyogenes     Review of Systems  Constitutional: Negative for chills, fever and weight loss.  HENT: Negative for congestion.   Eyes: Negative for blurred vision and double vision.  Respiratory: Negative for cough, sputum production, shortness of breath and wheezing.   Cardiovascular: Negative for chest pain, palpitations, orthopnea, leg swelling and PND.  Gastrointestinal: Negative for abdominal pain, blood in stool, constipation, diarrhea, nausea and vomiting.  Genitourinary: Negative for dysuria, frequency, hematuria and urgency.  Musculoskeletal: Negative for falls.  Skin: Positive for rash.  Neurological: Negative for dizziness, tremors, focal weakness and headaches.  Endo/Heme/Allergies: Does not bruise/bleed easily.  Psychiatric/Behavioral: Negative for depression. The patient does not have insomnia.     VITAL SIGNS: Blood pressure (!) 119/57, pulse 79, temperature 98.2 F (36.8 C), temperature source Oral, resp. rate 18, height 5' (1.524 m), weight 78.5  kg (173 lb), SpO2 97 %.  PHYSICAL EXAMINATION:   GENERAL:  76 y.o.-year-old patient lying in the bed with no acute distress.  EYES: Pupils equal, round, reactive to light and accommodation. No scleral icterus. Extraocular muscles intact.  HEENT: Head atraumatic, normocephalic. Oropharynx and nasopharynx clear.  NECK:  Supple, no jugular venous distention. No thyroid enlargement, no tenderness.  LUNGS: Normal breath sounds bilaterally, no wheezing, rales,rhonchi or crepitation. No use of accessory muscles of respiration.  CARDIOVASCULAR: S1, S2 normal. No murmurs, rubs, or gallops.  ABDOMEN: Soft, nontender, nondistended. Bowel sounds present. No organomegaly or mass.  EXTREMITIES: Left lower extremity and pedal edema, erythema, swelling in the calf area, significant tenderness to palpation, no cyanosis, or clubbing.  NEUROLOGIC: Cranial nerves II through XII are intact. Muscle strength 5/5 in all extremities. Sensation intact. Gait not checked.  PSYCHIATRIC: The patient is alert and oriented x 3.  SKIN: No obvious rash, lesion, or ulcer.   ORDERS/RESULTS REVIEWED:   CBC  Recent Labs Lab 12/17/16 1328 12/18/16 0425  WBC 26.2* 21.3*  HGB 12.7 11.4*  HCT 37.4 34.0*  PLT 244 208  MCV 85.5 86.7  MCH 29.0 29.1  MCHC 33.9 33.6  RDW 16.2* 16.1*  LYMPHSABS 0.4*  --   MONOABS 1.2*  --   EOSABS 0.0  --   BASOSABS 0.1  --    ------------------------------------------------------------------------------------------------------------------  Chemistries   Recent Labs Lab 12/17/16 1328 12/17/16 1816  NA 135  --   K 3.1*  --   CL 97*  --   CO2 27  --   GLUCOSE 139*  --   BUN 11  --   CREATININE 0.88  --   CALCIUM 9.4  --   MG  --  1.6*  AST 56*  --  ALT 26  --   ALKPHOS 59  --   BILITOT 0.9  --    ------------------------------------------------------------------------------------------------------------------ estimated creatinine clearance is 51.2 mL/min (by C-G formula  based on SCr of 0.88 mg/dL). ------------------------------------------------------------------------------------------------------------------ No results for input(s): TSH, T4TOTAL, T3FREE, THYROIDAB in the last 72 hours.  Invalid input(s): FREET3  Cardiac Enzymes  Recent Labs Lab 12/17/16 1328 12/17/16 1816  TROPONINI 0.05* <0.03   ------------------------------------------------------------------------------------------------------------------ Invalid input(s): POCBNP ---------------------------------------------------------------------------------------------------------------  RADIOLOGY: Dg Chest 2 View  Result Date: 12/17/2016 CLINICAL DATA:  Fever and leukocytosis. EXAM: CHEST  2 VIEW COMPARISON:  11/03/2015 FINDINGS: There is no edema, consolidation, effusion, or pneumothorax. Normal heart size and mediastinal contours. Aortic arch atherosclerosis. 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 IMPRESSION: No active cardiopulmonary disease. Electronically Signed   By: Monte Fantasia M.D.   On: 12/17/2016 15:01   Ct Tibia Fibula Left W Contrast  Result Date: 12/18/2016 CLINICAL DATA:  Onset left lower leg redness and pain 12/17/2016. 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 No known injury. EXAM: CT OF THE LOWER RIGHT EXTREMITY WITH CONTRAST TECHNIQUE: Multidetector CT imaging of the lower right extremity was performed according to the standard protocol following intravenous contrast administration. COMPARISON:  Two views left knee 08/07/2014. 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 No hardware complication is identified. No bony destructive change or periosteal reaction is seen. Small osteochondral lesion medial talar dome is incidentally noted. Ligaments Suboptimally assessed by CT. Muscles and Tendons Intact. Fat planes within muscle are preserved. No  intramuscular fluid collection or edematous change is identified. Soft tissues Subcutaneous fatty tissues appear mildly edematous about the ankle. No fluid collection is identified. Major vascular structures enhance normally. IMPRESSION: Mild infiltration of subcutaneous fatty tissues about the ankle could be due to dependent change or cellulitis. Negative for myositis, abscess or evidence of osteomyelitis. 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 Electronically Signed   By: Inge Rise M.D.   On: 12/18/2016 11:08   US Venous Img Lower Unilateral Left  Result Date: 12/18/2016 CLINICAL DATA:  Swelling x1 day, pain, color changes, on aspirin EXAM: LEFT LOWER EXTREMITY VENOUS DOPPLER ULTRASOUND TECHNIQUE: Gray-scale sonography with compression, as well as color and duplex ultrasound, were performed to evaluate the deep venous system from the level of the common femoral vein through the popliteal and proximal calf veins. COMPARISON:  None FINDINGS: Normal compressibility of the common femoral, superficial femoral, and popliteal veins, as well as the proximal calf veins. No filling defects to suggest DVT on grayscale or color Doppler imaging. Doppler waveforms show normal direction of venous flow, normal respiratory phasicity and response to augmentation. Survey views of the contralateral common femoral vein are unremarkable. IMPRESSION: No evidence of  lower extremity deep vein thrombosis, left. Electronically Signed   By: Lucrezia Europe M.D.   On: 12/18/2016 10:28    EKG:  Orders placed or performed during the hospital encounter of 12/17/16  . ED EKG  . ED EKG    ASSESSMENT AND PLAN:  Active Problems:   Sepsis ([place])  #1. Streptococcus pyogenes sepsis due to left lower extremity cellulitis, no myositis or fasciitis was noted on CT, continue patient on Azactam and vancomycin intravenously, awaiting for ID to see patient #2. Left lower extremity cellulitis, continue  antibiotic therapy, following culture results and sensitivities #3. Hypokalemia, supplementing intravenously, magnesium level was found to be low, supplementing intravenously as well #4. Hypotension, resolved, continue IV fluids #5.  Leukocytosis, improving #6. Elevated troponin, likely demand ischemia, no chest pains were reported  Management plans discussed with the patient, family and they are in agreement.   DRUG ALLERGIES:  Allergies  Allergen Reactions  . Ace Inhibitors Cough  . Benicar [Olmesartan]   . Amoxicillin Rash  . Codeine Sulfate Rash  . Levaquin [Levofloxacin In D5w] Rash  . Penicillin V Potassium Rash  . Sulfa Antibiotics Rash    CODE STATUS:     Code Status Orders        Start     Ordered   12/17/16 1752  Full code  Continuous     12/17/16 1751    Code Status History    Date Active Date Inactive Code Status Order ID Comments User Context   This patient has a current code status but no historical code status.      TOTAL TIME TAKING CARE OF THIS PATIENT: 40 minutes.    Theodoro Grist M.D on 12/18/2016 at 5:59 PM  Between 7am to 6pm - Pager - [phone]  After 6pm go to [email or web address] - password [email or web address] [facility]  Office  [phone]  CC: Primary care physician; Madelyn Brunner, MD

## 2016-12-18 NOTE — Consult Note (Signed)
Pharmacy Antibiotic Note  Erin Sutton is a 76 y.o. female admitted on 12/17/2016 with sepsis 2/2 bacteremia.  Pharmacy has been consulted for aztreonam and vancomycin dosing. 1/25 aztreonam d/c Pt has rash to levofloxacin, amoxicillin, PNC, and sulfa 1/25 BCID: streptococcus pyogenes  Plan: Continue vancomycin 1000 mg IV q 24h with goal trough 15 - 20 mcg/ml Trough prior to the 4th dose 1/27 @ 0230  Height: 5' (152.4 cm) Weight: 173 lb (78.5 kg) IBW/kg (Calculated) : 45.5  Temp (24hrs), Avg:99 F (37.2 C), Min:98.2 F (36.8 C), Max:99.5 F (37.5 C)   Recent Labs Lab 12/17/16 1328 12/17/16 1816 12/18/16 0425  WBC 26.2*  --  21.3*  CREATININE 0.88  --   --   LATICACIDVEN  --  1.2  --     Estimated Creatinine Clearance: 51.2 mL/min (by C-G formula based on SCr of 0.88 mg/dL).    Allergies  Allergen Reactions  . Ace Inhibitors Cough  . Benicar [Olmesartan]   . Amoxicillin Rash  . Codeine Sulfate Rash  . Levaquin [Levofloxacin In D5w] Rash  . Penicillin V Potassium Rash  . Sulfa Antibiotics Rash    Antimicrobials this admission: vancomycin 1/24 >>  aztreonam 1/24 >> 1/25 Azithromycin 1/24>> one dose  Dose adjustments this admission:  Microbiology results: 1/24 BCx: streptococcus pyogenes 1/24 UCx: sent  Thank you for allowing pharmacy to be a part of this patient's care.  Darrow Bussing, PharmD Pharmacy Resident 12/18/2016 2:04 PM

## 2016-12-18 NOTE — Progress Notes (Signed)
PHARMACY - PHYSICIAN COMMUNICATION CRITICAL VALUE ALERT - BLOOD CULTURE IDENTIFICATION (BCID)  No results found for this or any previous visit.  Anaerobic x1 Strep. pyogenes per lab.  Name of physician (or Provider) Contacted: n/a. Pt on vancomycin with PCN allergy. Other c/s pending  Changes to prescribed antibiotics required: n/a.  Ellyssa Zagal S 12/18/2016  7:01 AM

## 2016-12-19 LAB — CBC
HCT: 33.1 % — ABNORMAL LOW (ref 35.0–47.0)
Hemoglobin: 11.1 g/dL — ABNORMAL LOW (ref 12.0–16.0)
MCH: 29.3 pg (ref 26.0–34.0)
MCHC: 33.6 g/dL (ref 32.0–36.0)
MCV: 87.1 fL (ref 80.0–100.0)
Platelets: 200 10*3/uL (ref 150–440)
RBC: 3.8 MIL/uL (ref 3.80–5.20)
RDW: 15.8 % — ABNORMAL HIGH (ref 11.5–14.5)
WBC: 14.3 10*3/uL — ABNORMAL HIGH (ref 3.6–11.0)

## 2016-12-19 MED ORDER — POLYETHYLENE GLYCOL 3350 17 G PO PACK
17.0000 g | PACK | Freq: Two times a day (BID) | ORAL | Status: DC
  Administered 2016-12-19 – 2016-12-20 (×4): 17 g via ORAL
  Filled 2016-12-19 (×5): qty 1

## 2016-12-19 MED ORDER — CEFAZOLIN SODIUM-DEXTROSE 2-4 GM/100ML-% IV SOLN
2.0000 g | Freq: Three times a day (TID) | INTRAVENOUS | Status: DC
  Administered 2016-12-19 – 2016-12-21 (×5): 2 g via INTRAVENOUS
  Filled 2016-12-19 (×7): qty 100

## 2016-12-19 NOTE — Care Management Important Message (Signed)
Important Message  Patient Details  Name: Erin Sutton MRN: [id number] Date of Birth: Mar 09, 1941   Medicare Important Message Given:  Yes    Shelbie Ammons, RN 12/19/2016, 10:48 AM

## 2016-12-19 NOTE — Care Management (Signed)
Admitted to this facility with the diagnosis of sepsis. Lives alone. Friend is Salomon Mast [phone]). Last seen Dr. Lisette Grinder 2-3 weeks ago. [place] in the past. [place] following knee replacement 2.5 years ago. No home oxygen. Last fall was a month ago. Decreased appetite x 1 month. Lost 8 pounds. Raised toilet seat, grab bars and cane in the home. Prescriptions are [facility] at [facility] or mail order. Takes care of all basic activities of daily living herself. Unsure who will transport. Physical therapy evaluation completed. Recommending home with home health/physical therapy, [place]. Will update Floydene Flock, [facility] representative.       Shelbie Ammons RN MSN CCM Care Management

## 2016-12-19 NOTE — Consult Note (Signed)
[facility] Infectious Disease     Reason for Consult: Sepsis    Referring Physician: Theodoro Grist Date of Admission:  12/17/2016   Active Problems:   Sepsis [facility])   HPI: Erin Sutton is a 76 y.o. female admitted with NV, fever chills and cough.  She had a few days of LLE redness and pain prior. On admit temp was 100, wbc 26. Flu PCR neg, UA neg, BCX now + GA Strep.  Noted to have LLE cellulitis. CT neg for myositis. Has been on vanco and aztreonam. Multiple drug allergies but had only a rash with Amox- no anaphylaxis.   Past Medical History:  Diagnosis Date  . Anxiety   . Arthritis   . Chronic kidney disease   . Constipation   . GERD (gastroesophageal reflux disease)   . Hypertension   . Hypothyroidism   . Sleep apnea    Past Surgical History:  Procedure Laterality Date  . BREAST EXCISIONAL BIOPSY Right    neg  . COLONOSCOPY WITH PROPOFOL N/A 07/02/2015   Procedure: COLONOSCOPY WITH PROPOFOL;  Surgeon: Manya Silvas, MD;  Location: [facility] ENDOSCOPY;  Service: Endoscopy;  Laterality: N/A;  . CORRECTION HAMMER TOE Right   . ESOPHAGOGASTRODUODENOSCOPY (EGD) WITH PROPOFOL N/A 07/02/2015   Procedure: ESOPHAGOGASTRODUODENOSCOPY (EGD) WITH PROPOFOL;  Surgeon: Manya Silvas, MD;  Location: [facility] ENDOSCOPY;  Service: Endoscopy;  Laterality: N/A;  . JOINT REPLACEMENT Left    knee   Social History  Substance Use Topics  . Smoking status: Never Smoker  . Smokeless tobacco: Never Used  . Alcohol use No   Family History  Problem Relation Age of Onset  . Hypertension Mother   . Diabetes Mother   . Heart attack Mother   . Asthma Mother   . Peripheral vascular disease Mother   . Heart attack Father   . Hypertension Father   . Diabetes Father     Allergies:  Allergies  Allergen Reactions  . Ace Inhibitors Cough  . Benicar [Olmesartan]   . Amoxicillin Rash  . Codeine Sulfate Rash  . Levaquin [Levofloxacin In D5w] Rash  . Penicillin V Potassium Rash  . Sulfa  Antibiotics Rash    Current antibiotics: Antibiotics Given (last 72 hours)    Date/Time Action Medication Dose Rate   12/17/16 1959 Given   aztreonam (AZACTAM) 2 g in dextrose 5 % 50 mL IVPB 2 g 100 mL/hr   12/17/16 2046 Given  [incompatibility of IV abx]   vancomycin (VANCOCIN) IVPB 1000 mg/200 mL premix 1,000 mg 200 mL/hr   12/18/16 3614 Given   vancomycin (VANCOCIN) IVPB 1000 mg/200 mL premix 1,000 mg 200 mL/hr   12/18/16 0451 Given   aztreonam (AZACTAM) 2 g in dextrose 5 % 50 mL IVPB 2 g 100 mL/hr   12/19/16 0416 Given   vancomycin (VANCOCIN) IVPB 1000 mg/200 mL premix 1,000 mg 200 mL/hr      MEDICATIONS: . aspirin EC  81 mg Oral Daily  . enoxaparin (LOVENOX) injection  40 mg Subcutaneous Q24H  . levothyroxine  50 mcg Oral QAC breakfast  . magnesium oxide  400 mg Oral Daily  . mouth rinse  15 mL Mouth Rinse BID  . pantoprazole  40 mg Oral Daily  . polyethylene glycol  17 g Oral BID  . potassium chloride SA  20 mEq Oral q morning - 10a  . pravastatin  40 mg Oral Daily  . sertraline  100 mg Oral Daily  . vancomycin  1,000 mg Intravenous  Q24H    Review of Systems - 11 systems reviewed and negative per HPI   OBJECTIVE: Temp:  [98.1 F (36.7 C)-98.7 F (37.1 C)] 98.1 F (36.7 C) (01/26 0747) Pulse Rate:  [74-84] 84 (01/26 0747) Resp:  [12-18] 12 (01/26 0747) BP: (118-135)/(61-64) 135/64 (01/26 0747) SpO2:  [96 %-98 %] 96 % (01/26 0917) Physical Exam  Constitutional:  Pleasant oriented to person, place, and time. appears well-developed and well-nourished. No distress.  HENT: [place]/AT, PERRLA, no scleral icterus Mouth/Throat: Oropharynx is clear and dry . No oropharyngeal exudate.  Cardiovascular: Normal rate, regular rhythm and normal heart sounds.  Pulmonary/Chest: Effort normal and breath sounds normal. No respiratory distress.  has no wheezes.  Neck = supple, no nuchal rigidity Abdominal: Soft. Bowel sounds are normal.  exhibits no distension. There is no  tenderness.  Lymphadenopathy: no cervical adenopathy. No axillary adenopathy Neurological: alert and oriented to person, place, and time.  Skin: LLE with several splotches of erythema and warmth, non tender, no purulence Psychiatric: a normal mood and affect.  behavior is normal.    LABS: Results for orders placed or performed during the hospital encounter of 12/17/16 (from the past 48 hour(s))  Lactic acid, plasma     Status: None   Collection Time: 12/17/16  6:16 PM  Result Value Ref Range   Lactic Acid, Venous 1.2 0.5 - 1.9 mmol/L  Procalcitonin     Status: None   Collection Time: 12/17/16  6:16 PM  Result Value Ref Range   Procalcitonin 7.02 ng/mL    Comment:        Interpretation: PCT > 2 ng/mL: Systemic infection (sepsis) is likely, unless other causes are known. (NOTE)         ICU PCT Algorithm               Non ICU PCT Algorithm    ----------------------------     ------------------------------         PCT < 0.25 ng/mL                 PCT < 0.1 ng/mL     Stopping of antibiotics            Stopping of antibiotics       strongly encouraged.               strongly encouraged.    ----------------------------     ------------------------------       PCT level decrease by               PCT < 0.25 ng/mL       >= 80% from peak PCT       OR PCT 0.25 - 0.5 ng/mL          Stopping of antibiotics                                             encouraged.     Stopping of antibiotics           encouraged.    ----------------------------     ------------------------------       PCT level decrease by              PCT >= 0.25 ng/mL       < 80% from peak PCT        AND PCT >= 0.5 ng/mL  Continuing antibiotics                                               encouraged.       Continuing antibiotics            encouraged.    ----------------------------     ------------------------------     PCT level increase compared          PCT > 0.5 ng/mL         with peak PCT AND           PCT >= 0.5 ng/mL             Escalation of antibiotics                                          strongly encouraged.      Escalation of antibiotics        strongly encouraged.   Protime-INR     Status: Abnormal   Collection Time: 12/17/16  6:16 PM  Result Value Ref Range   Prothrombin Time 15.3 (H) 11.4 - 15.2 seconds   INR 1.20   APTT     Status: None   Collection Time: 12/17/16  6:16 PM  Result Value Ref Range   aPTT 35 24 - 36 seconds  Troponin I     Status: None   Collection Time: 12/17/16  6:16 PM  Result Value Ref Range   Troponin I <0.03 <0.03 ng/mL  Magnesium     Status: Abnormal   Collection Time: 12/17/16  6:16 PM  Result Value Ref Range   Magnesium 1.6 (L) 1.7 - 2.4 mg/dL  CBC     Status: Abnormal   Collection Time: 12/18/16  4:25 AM  Result Value Ref Range   WBC 21.3 (H) 3.6 - 11.0 K/uL   RBC 3.92 3.80 - 5.20 MIL/uL   Hemoglobin 11.4 (L) 12.0 - 16.0 g/dL   HCT 34.0 (L) 35.0 - 47.0 %   MCV 86.7 80.0 - 100.0 fL   MCH 29.1 26.0 - 34.0 pg   MCHC 33.6 32.0 - 36.0 g/dL   RDW 16.1 (H) 11.5 - 14.5 %   Platelets 208 150 - 440 K/uL  CULTURE, BLOOD (ROUTINE X 2) w Reflex to ID Panel     Status: None (Preliminary result)   Collection Time: 12/18/16  6:10 PM  Result Value Ref Range   Specimen Description BLOOD  RIGHT FOERARM    Special Requests      BOTTLES DRAWN AEROBIC AND ANAEROBIC  AER 7 ML ANA 3 ML   Culture NO GROWTH < 12 HOURS    Report Status PENDING   CULTURE, BLOOD (ROUTINE X 2) w Reflex to ID Panel     Status: None (Preliminary result)   Collection Time: 12/18/16  6:10 PM  Result Value Ref Range   Specimen Description BLOOD  RIGHT AC    Special Requests      BOTTLES DRAWN AEROBIC AND ANAEROBIC  AER 8 ML ANA 7 ML    Culture NO GROWTH < 12 HOURS    Report Status PENDING   CBC     Status: Abnormal   Collection Time: 12/19/16  3:48 AM  Result Value Ref Range  WBC 14.3 (H) 3.6 - 11.0 K/uL   RBC 3.80 3.80 - 5.20 MIL/uL   Hemoglobin 11.1 (L) 12.0 - 16.0 g/dL    HCT 33.1 (L) 35.0 - 47.0 %   MCV 87.1 80.0 - 100.0 fL   MCH 29.3 26.0 - 34.0 pg   MCHC 33.6 32.0 - 36.0 g/dL   RDW 15.8 (H) 11.5 - 14.5 %   Platelets 200 150 - 440 K/uL   No components found for: ESR, C REACTIVE PROTEIN MICRO: Recent Results (from the past 720 hour(s))  Culture, blood (routine x 2)     Status: None (Preliminary result)   Collection Time: 12/17/16  1:33 PM  Result Value Ref Range Status   Specimen Description BLOOD L AC  Final   Special Requests   Final    BOTTLES DRAWN AEROBIC AND ANAEROBIC AER 13ML ANA 14ML   Culture NO GROWTH 2 DAYS  Final   Report Status PENDING  Incomplete  Culture, blood (routine x 2)     Status: None (Preliminary result)   Collection Time: 12/17/16  1:34 PM  Result Value Ref Range Status   Specimen Description BLOOD R AC  Final   Special Requests   Final    BOTTLES DRAWN AEROBIC AND ANAEROBIC AER 14ML ANA 15ML   Culture  Setup Time   Final    GRAM POSITIVE COCCI ANAEROBIC BOTTLE ONLY CRITICAL RESULT CALLED TO, READ BACK BY AND VERIFIED WITH:  MATT MCBANE AT 0700 12/18/16 SDR Performed at [facility], [place] [address] [place], [place] [address]    Culture GRAM POSITIVE COCCI  Final   Report Status PENDING  Incomplete  Urine culture     Status: Abnormal   Collection Time: 12/17/16  1:34 PM  Result Value Ref Range Status   Specimen Description URINE, CLEAN CATCH  Final   Special Requests NONE  Final   Culture MULTIPLE SPECIES PRESENT, SUGGEST RECOLLECTION (A)  Final   Report Status 12/18/2016 FINAL  Final  Blood Culture ID Panel (Reflexed)     Status: Abnormal   Collection Time: 12/17/16  1:34 PM  Result Value Ref Range Status   Enterococcus species NOT DETECTED NOT DETECTED Final   Listeria monocytogenes NOT DETECTED NOT DETECTED Final   Staphylococcus species NOT DETECTED NOT DETECTED Final   Staphylococcus aureus NOT DETECTED NOT DETECTED Final   Streptococcus species DETECTED (A) NOT DETECTED Final    Comment: CRITICAL  RESULT CALLED TO, READ BACK BY AND VERIFIED WITH:  MATT MCBANE AT 0700 12/18/16 SDR    Streptococcus agalactiae NOT DETECTED NOT DETECTED Final   Streptococcus pneumoniae NOT DETECTED NOT DETECTED Final   Streptococcus pyogenes DETECTED (A) NOT DETECTED Final    Comment: CRITICAL RESULT CALLED TO, READ BACK BY AND VERIFIED WITH:  MATT MCBANE AT 0700 12/18/16 SDR    Acinetobacter baumannii NOT DETECTED NOT DETECTED Final   Enterobacteriaceae species NOT DETECTED NOT DETECTED Final   Enterobacter cloacae complex NOT DETECTED NOT DETECTED Final   Escherichia coli NOT DETECTED NOT DETECTED Final   Klebsiella oxytoca NOT DETECTED NOT DETECTED Final   Klebsiella pneumoniae NOT DETECTED NOT DETECTED Final   Proteus species NOT DETECTED NOT DETECTED Final   Serratia marcescens NOT DETECTED NOT DETECTED Final   Haemophilus influenzae NOT DETECTED NOT DETECTED Final   Neisseria meningitidis NOT DETECTED NOT DETECTED Final   Pseudomonas aeruginosa NOT DETECTED NOT DETECTED Final   Candida albicans NOT DETECTED NOT DETECTED Final   Candida glabrata NOT  DETECTED NOT DETECTED Final   Candida krusei NOT DETECTED NOT DETECTED Final   Candida parapsilosis NOT DETECTED NOT DETECTED Final   Candida tropicalis NOT DETECTED NOT DETECTED Final  CULTURE, BLOOD (ROUTINE X 2) w Reflex to ID Panel     Status: None (Preliminary result)   Collection Time: 12/18/16  6:10 PM  Result Value Ref Range Status   Specimen Description BLOOD  RIGHT FOERARM  Final   Special Requests   Final    BOTTLES DRAWN AEROBIC AND ANAEROBIC  AER 7 ML ANA 3 ML   Culture NO GROWTH < 12 HOURS  Final   Report Status PENDING  Incomplete  CULTURE, BLOOD (ROUTINE X 2) w Reflex to ID Panel     Status: None (Preliminary result)   Collection Time: 12/18/16  6:10 PM  Result Value Ref Range Status   Specimen Description BLOOD  RIGHT AC  Final   Special Requests   Final    BOTTLES DRAWN AEROBIC AND ANAEROBIC  AER 8 ML ANA 7 ML    Culture NO  GROWTH < 12 HOURS  Final   Report Status PENDING  Incomplete    IMAGING: Dg Chest 2 View  Result Date: 12/17/2016 CLINICAL DATA:  Fever and leukocytosis. EXAM: CHEST  2 VIEW COMPARISON:  11/03/2015 FINDINGS: There is no edema, consolidation, effusion, or pneumothorax. Normal heart size and mediastinal contours. Aortic arch atherosclerosis. 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 IMPRESSION: No active cardiopulmonary disease. Electronically Signed   By: Monte Fantasia M.D.   On: 12/17/2016 15:01   Ct Tibia Fibula Left W Contrast  Result Date: 12/18/2016 CLINICAL DATA:  Onset left lower leg redness and pain 12/17/2016. 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 No known injury. EXAM: CT OF THE LOWER RIGHT EXTREMITY WITH CONTRAST TECHNIQUE: Multidetector CT imaging of the lower right extremity was performed according to the standard protocol following intravenous contrast administration. COMPARISON:  Two views left knee 08/07/2014. 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 No hardware complication is identified. No bony destructive change or periosteal reaction is seen. Small osteochondral lesion medial talar dome is incidentally noted. Ligaments Suboptimally assessed by CT. Muscles and Tendons Intact. Fat planes within muscle are preserved. No intramuscular fluid collection or edematous change is identified. Soft tissues Subcutaneous fatty tissues appear mildly edematous about the ankle. No fluid collection is identified. Major vascular structures enhance normally. IMPRESSION: Mild infiltration of subcutaneous fatty tissues about the ankle could be due to dependent change or cellulitis. Negative for myositis, abscess or evidence of osteomyelitis. 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 Electronically Signed   By: Inge Rise M.D.   On:  12/18/2016 11:08   US Venous Img Lower Unilateral Left  Result Date: 12/18/2016 CLINICAL DATA:  Swelling x1 day, pain, color changes, on aspirin EXAM: LEFT LOWER EXTREMITY VENOUS DOPPLER ULTRASOUND TECHNIQUE: Gray-scale sonography with compression, as well as color and duplex ultrasound, were performed to evaluate the deep venous system from the level of the common femoral vein through the popliteal and proximal calf veins. COMPARISON:  None FINDINGS: Normal compressibility of the common femoral, superficial femoral, and popliteal veins, as well as the proximal calf veins. No filling defects to suggest DVT on grayscale or color Doppler imaging. Doppler waveforms show normal direction of venous flow, normal respiratory phasicity and response to augmentation. Survey views of the contralateral common femoral vein are unremarkable. IMPRESSION: No evidence of  lower  extremity deep vein thrombosis, left. Electronically Signed   By: Lucrezia Europe M.D.   On: 12/18/2016 10:28   US Breast Ltd Uni Right Inc Axilla  Result Date: 12/03/2016 CLINICAL DATA:  Screening recall for a possible right breast mass. EXAM: 2D DIGITAL DIAGNOSTIC UNILATERAL RIGHT MAMMOGRAM WITH CAD AND ADJUNCT TOMO RIGHT BREAST ULTRASOUND COMPARISON:  Previous exam(s). ACR Breast Density Category c: The breast tissue is heterogeneously dense, which may obscure small masses. FINDINGS: In the lower-inner quadrant of the right breast, there is a 1.5 cm oval circumscribed mass. Mammographic images were processed with CAD. A mobile palpable mass is identified in the medial slightly lower aspect of the right breast. Ultrasound targeted to the right breast at 4 o'clock, 1 cm from the nipple demonstrates a the near anechoic circumscribed oval mass measuring 1.3 x 1.0 x 1.2 cm. Non mobile echogenic material is seen within the mass, which is favored to represent a complicated cyst, however the debris is not mobile. No blood flow seen within the mass on color  Doppler imaging. IMPRESSION: The mass in the right breast most likely represents a complicated cyst, however aspiration is warranted. RECOMMENDATION: Ultrasound-guided aspiration is recommended for the right breast cyst at 4 o'clock. I have discussed the findings and recommendations with the patient. Results were also provided in writing at the conclusion of the visit. If applicable, a reminder letter will be sent to the patient regarding the next appointment. BI-RADS CATEGORY  4: Suspicious. Electronically Signed   By: Ammie Ferrier M.D.   On: 12/03/2016 15:25   Mm Diag Breast Tomo Uni Right  Result Date: 12/03/2016 CLINICAL DATA:  Screening recall for a possible right breast mass. EXAM: 2D DIGITAL DIAGNOSTIC UNILATERAL RIGHT MAMMOGRAM WITH CAD AND ADJUNCT TOMO RIGHT BREAST ULTRASOUND COMPARISON:  Previous exam(s). ACR Breast Density Category c: The breast tissue is heterogeneously dense, which may obscure small masses. FINDINGS: In the lower-inner quadrant of the right breast, there is a 1.5 cm oval circumscribed mass. Mammographic images were processed with CAD. A mobile palpable mass is identified in the medial slightly lower aspect of the right breast. Ultrasound targeted to the right breast at 4 o'clock, 1 cm from the nipple demonstrates a the near anechoic circumscribed oval mass measuring 1.3 x 1.0 x 1.2 cm. Non mobile echogenic material is seen within the mass, which is favored to represent a complicated cyst, however the debris is not mobile. No blood flow seen within the mass on color Doppler imaging. IMPRESSION: The mass in the right breast most likely represents a complicated cyst, however aspiration is warranted. RECOMMENDATION: Ultrasound-guided aspiration is recommended for the right breast cyst at 4 o'clock. I have discussed the findings and recommendations with the patient. Results were also provided in writing at the conclusion of the visit. If applicable, a reminder letter will be sent to  the patient regarding the next appointment. BI-RADS CATEGORY  4: Suspicious. Electronically Signed   By: Ammie Ferrier M.D.   On: 12/03/2016 15:25    Assessment:   Erin Sutton is a 76 y.o. female with Group A strep bacteremia from LLE cellulitis.  Clinically improving and this is her first case of LE cellulitis. Her risk for cellulitis is very dry skin and also TKR on that limb. Knee joint not involved. Minimal edema.   Recommendations Change vanco to ancef. If clinically improving can dc tomorrow on oral keflex 500 mg tid  for a total 14 day course I can see in clinic at  end of abx course.   Thank you very much for allowing me to participate in the care of this patient. Please call with questions.   Cheral Marker. Ola Spurr, MD

## 2016-12-19 NOTE — Consult Note (Signed)
Pharmacy Antibiotic Note  Erin Sutton is a 76 y.o. female admitted on 12/17/2016 with S pyogenes bacteremia due to LE cellulitis.  Pharmacy has been consulted for cefazolin dosing. ID planning possible d/c on cephalexin.   Plan: Cefazolin 2 g iv q 8 hours.   Height: 5' (152.4 cm) Weight: 173 lb (78.5 kg) IBW/kg (Calculated) : 45.5  Temp (24hrs), Avg:98.3 F (36.8 C), Min:98.1 F (36.7 C), Max:98.7 F (37.1 C)   Recent Labs Lab 12/17/16 1328 12/17/16 1816 12/18/16 0425 12/19/16 0348  WBC 26.2*  --  21.3* 14.3*  CREATININE 0.88  --   --   --   LATICACIDVEN  --  1.2  --   --     Estimated Creatinine Clearance: 51.2 mL/min (by C-G formula based on SCr of 0.88 mg/dL).    Allergies  Allergen Reactions  . Ace Inhibitors Cough  . Benicar [Olmesartan]   . Amoxicillin Rash  . Codeine Sulfate Rash  . Levaquin [Levofloxacin In D5w] Rash  . Penicillin V Potassium Rash  . Sulfa Antibiotics Rash    Antimicrobials this admission: vancomycin 1/24 >> 1/26 aztreonam 1/24 >> 1/25 Azithromycin 1/24>> one dose Cefazolin 1/26 >>  Dose adjustments this admission:  Microbiology results: 1/24 BCx: streptococcus pyogenes 1/24 UCx: mx species  Thank you for allowing pharmacy to be a part of this patient's care.  Ulice Dash, PharmD Clinical Pharmacist  12/19/2016 3:09 PM

## 2016-12-19 NOTE — Progress Notes (Signed)
[facility] [place] NAME: Erin Sutton    MR#:  [id number]  DATE OF BIRTH:  September 30, 1941  SUBJECTIVE:  CHIEF COMPLAINT:   Chief Complaint  Patient presents with  . Influenza   Patient is 75 year old female with past medical history significant for history of hypertension, CAD, gastroesophageal reflux disease, hypothyroidism, obstructive sleep apnea, who presented to the hospital with complaints of nausea, vomiting, generalized weakness, muscle aches, fever and chills, cough. On arrival to the hospital, she was found to have tachycardia,  hypotension, leukocytosis. Her chest x-ray, urinalysis were unremarkable. She was noted to have left lower extremity cellulitis, no complaints of significant calf tenderness, pain on the left side, spreading and erythema. She feels overall comfortable except of left lower extremity pain. She is afebrile. Blood cultures are growing Streptococcus pyogenes. Repeated blood cultures pending. Patient feels comfortable, still complains of left lower extremity pain, increased erythema, less swelling in calf. Overall. Radiology studies revealed no DVT, fasciitis or myositis. On cefazolin by infectious disease specialist, Dr. Ola Spurr, who recommends to change to Keflex if the patient feels comfortable tomorrow to be discharged home    Review of Systems  Constitutional: Negative for chills, fever and weight loss.  HENT: Negative for congestion.   Eyes: Negative for blurred vision and double vision.  Respiratory: Negative for cough, sputum production, shortness of breath and wheezing.   Cardiovascular: Negative for chest pain, palpitations, orthopnea, leg swelling and PND.  Gastrointestinal: Negative for abdominal pain, blood in stool, constipation, diarrhea, nausea and vomiting.  Genitourinary: Negative for dysuria, frequency, hematuria and urgency.  Musculoskeletal: Negative for falls.  Skin: Positive for rash.   Neurological: Negative for dizziness, tremors, focal weakness and headaches.  Endo/Heme/Allergies: Does not bruise/bleed easily.  Psychiatric/Behavioral: Negative for depression. The patient does not have insomnia.     VITAL SIGNS: Blood pressure 135/64, pulse 84, temperature 98.1 F (36.7 C), temperature source Oral, resp. rate 12, height 5' (1.524 m), weight 78.5 kg (173 lb), SpO2 96 %.  PHYSICAL EXAMINATION:   GENERAL:  75 y.o.-year-old patient lying in the bed with no acute distress.  EYES: Pupils equal, round, reactive to light and accommodation. No scleral icterus. Extraocular muscles intact.  HEENT: Head atraumatic, normocephalic. Oropharynx and nasopharynx clear.  NECK:  Supple, no jugular venous distention. No thyroid enlargement, no tenderness.  LUNGS: Normal breath sounds bilaterally, no wheezing, rales,rhonchi or crepitation. No use of accessory muscles of respiration.  CARDIOVASCULAR: S1, S2 normal. No murmurs, rubs, or gallops.  ABDOMEN: Soft, nontender, nondistended. Bowel sounds present. No organomegaly or mass.  EXTREMITIES: Left lower extremity and pedal edema, erythema, swelling in the calf area, significant tenderness to palpation, no cyanosis, or clubbing.  NEUROLOGIC: Cranial nerves II through XII are intact. Muscle strength 5/5 in all extremities. Sensation intact. Gait not checked.  PSYCHIATRIC: The patient is alert and oriented x 3.  SKIN: No obvious rash, lesion, or ulcer.   ORDERS/RESULTS REVIEWED:   CBC  Recent Labs Lab 12/17/16 1328 12/18/16 0425 12/19/16 0348  WBC 26.2* 21.3* 14.3*  HGB 12.7 11.4* 11.1*  HCT 37.4 34.0* 33.1*  PLT 244 208 200  MCV 85.5 86.7 87.1  MCH 29.0 29.1 29.3  MCHC 33.9 33.6 33.6  RDW 16.2* 16.1* 15.8*  LYMPHSABS 0.4*  --   --   MONOABS 1.2*  --   --   EOSABS 0.0  --   --   BASOSABS 0.1  --   --     ------------------------------------------------------------------------------------------------------------------  Chemistries   Recent Labs Lab 12/17/16 1328 12/17/16 1816  NA 135  --   K 3.1*  --   CL 97*  --   CO2 27  --   GLUCOSE 139*  --   BUN 11  --   CREATININE 0.88  --   CALCIUM 9.4  --   MG  --  1.6*  AST 56*  --   ALT 26  --   ALKPHOS 59  --   BILITOT 0.9  --    ------------------------------------------------------------------------------------------------------------------ estimated creatinine clearance is 51.2 mL/min (by C-G formula based on SCr of 0.88 mg/dL). ------------------------------------------------------------------------------------------------------------------ No results for input(s): TSH, T4TOTAL, T3FREE, THYROIDAB in the last 72 hours.  Invalid input(s): FREET3  Cardiac Enzymes  Recent Labs Lab 12/17/16 1328 12/17/16 1816  TROPONINI 0.05* <0.03   ------------------------------------------------------------------------------------------------------------------ Invalid input(s): POCBNP ---------------------------------------------------------------------------------------------------------------  RADIOLOGY: Ct Tibia Fibula Left W Contrast  Result Date: 12/18/2016 CLINICAL DATA:  Onset left lower leg redness and pain 12/17/2016. 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 No known injury. EXAM: CT OF THE LOWER RIGHT EXTREMITY WITH CONTRAST TECHNIQUE: Multidetector CT imaging of the lower right extremity was performed according to the standard protocol following intravenous contrast administration. COMPARISON:  Two views left knee 08/07/2014. 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 No hardware complication is identified. No bony destructive change or periosteal reaction is seen. Small osteochondral lesion medial talar dome is incidentally noted.  Ligaments Suboptimally assessed by CT. Muscles and Tendons Intact. Fat planes within muscle are preserved. No intramuscular fluid collection or edematous change is identified. Soft tissues Subcutaneous fatty tissues appear mildly edematous about the ankle. No fluid collection is identified. Major vascular structures enhance normally. IMPRESSION: Mild infiltration of subcutaneous fatty tissues about the ankle could be due to dependent change or cellulitis. Negative for myositis, abscess or evidence of osteomyelitis. 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 Electronically Signed   By: Inge Rise M.D.   On: 12/18/2016 11:08   US Venous Img Lower Unilateral Left  Result Date: 12/18/2016 CLINICAL DATA:  Swelling x1 day, pain, color changes, on aspirin EXAM: LEFT LOWER EXTREMITY VENOUS DOPPLER ULTRASOUND TECHNIQUE: Gray-scale sonography with compression, as well as color and duplex ultrasound, were performed to evaluate the deep venous system from the level of the common femoral vein through the popliteal and proximal calf veins. COMPARISON:  None FINDINGS: Normal compressibility of the common femoral, superficial femoral, and popliteal veins, as well as the proximal calf veins. No filling defects to suggest DVT on grayscale or color Doppler imaging. Doppler waveforms show normal direction of venous flow, normal respiratory phasicity and response to augmentation. Survey views of the contralateral common femoral vein are unremarkable. IMPRESSION: No evidence of  lower extremity deep vein thrombosis, left. Electronically Signed   By: Lucrezia Europe M.D.   On: 12/18/2016 10:28    EKG:  Orders placed or performed during the hospital encounter of 12/17/16  . ED EKG  . ED EKG    ASSESSMENT AND PLAN:  Active Problems:   Sepsis ([place])  #1. Streptococcus pyogenes sepsis due to left lower extremity cellulitis, no myositis or fasciitis was noted on CT, appreciate Dr. Blane Ohara input,  now on cefazolin intravenously, recommended to change to Keflex if patient is comfortable to go home tomorrow  #2. Left lower extremity cellulitis, continue antibiotic therapy, following culture results and sensitivities, now on cefazolin intravenously #3. Hypokalemia, supplementing intravenously, magnesium level was  found to be low, supplementing intravenously as well, recheck in the morning #4. Hypotension, resolved, discontinue IV fluids  #5. Leukocytosis, improved #6. Elevated troponin, likely demand ischemia, no chest pains were reported  Management plans discussed with the patient, family and they are in agreement.   DRUG ALLERGIES:  Allergies  Allergen Reactions  . Ace Inhibitors Cough  . Benicar [Olmesartan]   . Amoxicillin Rash  . Codeine Sulfate Rash  . Levaquin [Levofloxacin In D5w] Rash  . Penicillin V Potassium Rash  . Sulfa Antibiotics Rash    CODE STATUS:     Code Status Orders        Start     Ordered   12/17/16 1752  Full code  Continuous     12/17/16 1751    Code Status History    Date Active Date Inactive Code Status Order ID Comments User Context   This patient has a current code status but no historical code status.      TOTAL TIME TAKING CARE OF THIS PATIENT: 35 minutes.    Theodoro Grist M.D on 12/19/2016 at 6:04 PM  Between 7am to 6pm - Pager - [phone]  After 6pm go to [email or web address] - password [email or web address] [facility]  Office  [phone]  CC: Primary care physician; Madelyn Brunner, MD

## 2016-12-19 NOTE — Evaluation (Signed)
Physical Therapy Evaluation Patient Details Name: Erin Sutton MRN: [id number] DOB: 18-Jul-1941 Today's Date: 12/19/2016   History of Present Illness  Pt pleasant 76yo female admitted w/ Sepsis and L LE cellulitits. Per hospitalist has history of; HTN, GERD, CKD, hypothyroid, sleep apnea, CAD  Clinical Impression  Pt alert/awake and responsive and demonstrated good safety awareness. Nursing consulted before eval to remove Pt from O2. O2 at rest on room air was 96% w/ no complaints of breathing; she does not use O2 at baseline. Pt lives alone and is independent at baseline sometimes uses SPC to ambulate. Overall strength and ROM are WFL and grossly at least 4+/5. Able to move to sitting from supine independently. Demonstrated safe transfers from sit to stand using RW for additional stability. Pt able to maintain standing posture w/o AD but demonstrated min swaying. Pt ambulated 160' using RW and demonstrated minimal drifting but able to self correct. O2 remained above 93% throughout eval and nursing staff was notified. Pt displays minimal balance and ambulation deficits but overall safe for household mobility. Recommend continual use of RW for additional stability. Pt will benefit from skilled PT to improve deficits and recommend HHPT upon discharge from hospital.     Follow Up Recommendations Home health PT    Equipment Recommendations       Recommendations for Other Services       Precautions / Restrictions Precautions Precautions: Fall Restrictions Weight Bearing Restrictions: No      Mobility  Bed Mobility Overal bed mobility: Independent             General bed mobility comments: Use UE for push up into sitting   Transfers Overall transfer level: Needs assistance Equipment used: Rolling walker (2 wheeled) Transfers: Sit to/from Stand Sit to Stand: Supervision         General transfer comment: Bilat UE for push off, no cues and good safety  awareness  Ambulation/Gait Ambulation/Gait assistance: Supervision Ambulation Distance (Feet): 160 Feet Assistive device: Rolling walker (2 wheeled) Gait Pattern/deviations: Antalgic;Step-through pattern;Drifts right/left     General Gait Details: Good step through pattern, stated pain increased in L LE but tolerable, no signs of fatigue, minimal drifting able to self correct  Stairs            Wheelchair Mobility    Modified Rankin (Stroke Patients Only)       Balance Overall balance assessment: Needs assistance Sitting-balance support: No upper extremity supported Sitting balance-Leahy Scale: Normal Sitting balance - Comments: good upright posture able to perfrom functional dressing in sitting    Standing balance support: Bilateral upper extremity supported Standing balance-Leahy Scale: Good Standing balance comment: minimal swaying w/o AD, RW for additional stability                              Pertinent Vitals/Pain Pain Assessment: Faces Faces Pain Scale: Hurts a little bit Pain Location: L LE, Knee and calf Pain Descriptors / Indicators: Aching Pain Intervention(s): Monitored during session;Limited activity within patient's tolerance    Home Living Family/patient expects to be discharged to:: Private residence Living Arrangements: Alone   Type of Home: Mobile home Home Access: Stairs to enter Entrance Stairs-Rails: Can reach both;Right;Left Entrance Stairs-Number of Steps: 5 Home Layout: One level Home Equipment: Walker - 2 wheels;Cane - single point      Prior Function Level of Independence: Independent         Comments:  (occasionally uses SPC  for ambulation)     Hand Dominance   Dominant Hand: Right    Extremity/Trunk Assessment   Upper Extremity Assessment Upper Extremity Assessment: Overall WFL for tasks assessed (grossly at least 4+/5)    Lower Extremity Assessment Lower Extremity Assessment: Overall WFL for tasks  assessed (grossly at least 4+/5)    Cervical / Trunk Assessment Cervical / Trunk Assessment: Normal  Communication   Communication: No difficulties  Cognition Arousal/Alertness: Awake/alert Behavior During Therapy: WFL for tasks assessed/performed Overall Cognitive Status: Within Functional Limits for tasks assessed                      General Comments      Exercises Other Exercises Other Exercises: gait training to improve cardiopulmonary function and endurance; 160 ft, w/ RW and PT supervision   Assessment/Plan    PT Assessment Patient needs continued PT services  PT Problem List Decreased activity tolerance;Decreased balance;Decreased knowledge of use of DME;Decreased mobility;Cardiopulmonary status limiting activity;Pain          PT Treatment Interventions DME instruction;Gait training;Stair training;Functional mobility training;Therapeutic exercise;Therapeutic activities;Balance training;Patient/family education    PT Goals (Current goals can be found in the Care Plan section)  Acute Rehab PT Goals Patient Stated Goal: Return home and go to church  PT Goal Formulation: With patient Time For Goal Achievement: 01/02/17 Potential to Achieve Goals: Good    Frequency Min 2X/week   Barriers to discharge Decreased caregiver support lives alone    Co-evaluation               End of Session Equipment Utilized During Treatment: Gait belt Activity Tolerance: Patient tolerated treatment well Patient left: in chair;with call bell/phone within reach;with chair alarm set Nurse Communication: Mobility status (Stable O2 levels on room air )         Time: 5945-8592 PT Time Calculation (min) (ACUTE ONLY): 29 min   Charges:         PT G Codes:        [employer] Student PT 12/19/2016, 9:35 AM

## 2016-12-20 LAB — CBC WITH DIFFERENTIAL/PLATELET
Basophils Absolute: 0 10*3/uL (ref 0–0.1)
Basophils Relative: 1 %
Eosinophils Absolute: 0.2 10*3/uL (ref 0–0.7)
Eosinophils Relative: 2 %
HCT: 33.8 % — ABNORMAL LOW (ref 35.0–47.0)
Hemoglobin: 11.2 g/dL — ABNORMAL LOW (ref 12.0–16.0)
Lymphocytes Relative: 17 %
Lymphs Abs: 1.3 10*3/uL (ref 1.0–3.6)
MCH: 29 pg (ref 26.0–34.0)
MCHC: 33.2 g/dL (ref 32.0–36.0)
MCV: 87.2 fL (ref 80.0–100.0)
Monocytes Absolute: 0.7 10*3/uL (ref 0.2–0.9)
Monocytes Relative: 9 %
Neutro Abs: 5.1 10*3/uL (ref 1.4–6.5)
Neutrophils Relative %: 71 %
Platelets: 252 10*3/uL (ref 150–440)
RBC: 3.87 MIL/uL (ref 3.80–5.20)
RDW: 15.8 % — ABNORMAL HIGH (ref 11.5–14.5)
WBC: 7.2 10*3/uL (ref 3.6–11.0)

## 2016-12-20 LAB — MAGNESIUM: Magnesium: 1.9 mg/dL (ref 1.7–2.4)

## 2016-12-20 LAB — CREATININE, SERUM
Creatinine, Ser: 0.83 mg/dL (ref 0.44–1.00)
GFR calc Af Amer: >60 mL/min (ref >60)
GFR calc non Af Amer: >60 mL/min (ref >60)

## 2016-12-20 LAB — CULTURE, BLOOD (ROUTINE X 2)

## 2016-12-20 LAB — POTASSIUM: Potassium: 4.2 mmol/L (ref 3.5–5.1)

## 2016-12-20 NOTE — Progress Notes (Signed)
Physical Therapy Treatment Patient Details Name: Erin Sutton MRN: [id number] DOB: [birth year] Today's Date: 2017/01/13    History of Present Illness Pt pleasant 76yo female admitted w/ Sepsis and L LE cellulitits. Per hospitalist has history of; HTN, GERD, CKD, hypothyroid, sleep apnea, CAD    PT Comments    Pt was able to ambulate around nursing unit x 2 with walker and supervision.  Generally steady gait without LOB or difficulty.    Follow Up Recommendations  Home health PT     Equipment Recommendations       Recommendations for Other Services       Precautions / Restrictions Precautions Precautions: Fall Restrictions Weight Bearing Restrictions: No    Mobility  Bed Mobility Overal bed mobility: Independent                Transfers Overall transfer level: Needs assistance Equipment used: Rolling walker (2 wheeled) Transfers: Sit to/from Stand Sit to Stand: Supervision         General transfer comment: Bilat UE for push off, no cues and good safety awareness  Ambulation/Gait Ambulation/Gait assistance: Supervision Ambulation Distance (Feet): 320 Feet Assistive device: Rolling walker (2 wheeled) Gait Pattern/deviations: Step-through pattern         Stairs            Wheelchair Mobility    Modified Rankin (Stroke Patients Only)       Balance Overall balance assessment: Needs assistance Sitting-balance support: No upper extremity supported Sitting balance-Leahy Scale: Normal     Standing balance support: Bilateral upper extremity supported Standing balance-Leahy Scale: Good Standing balance comment: minimal swaying w/o AD, RW for additional stability                     Cognition Arousal/Alertness: Awake/alert Behavior During Therapy: WFL for tasks assessed/performed Overall Cognitive Status: Within Functional Limits for tasks assessed                      Exercises      General Comments         Pertinent Vitals/Pain Pain Assessment: No/denies pain    Home Living                      Prior Function            PT Goals (current goals can now be found in the care plan section) Progress towards PT goals: Progressing toward goals    Frequency    Min 2X/week      PT Plan      Co-evaluation             End of Session Equipment Utilized During Treatment: Gait belt Activity Tolerance: Patient tolerated treatment well Patient left: in bed;with call bell/phone within reach;with bed alarm set     Time: 2035-5974 PT Time Calculation (min) (ACUTE ONLY): 14 min  Charges:  $Gait Training: 8-22 mins                    G Codes:      Chesley Noon 01-13-2017, 10:50 AM

## 2016-12-20 NOTE — Plan of Care (Signed)
Problem: Activity: Goal: Risk for activity intolerance will decrease Outcome: Progressing Pt ambulating around  Nurses station and room. Rest periods in between. No distress

## 2016-12-20 NOTE — Progress Notes (Signed)
[facility] [place] NAME: Erin Sutton    MR#:  [id number]  DATE OF BIRTH:  [birth year]  SUBJECTIVE:  CHIEF COMPLAINT:   Chief Complaint  Patient presents with  . Influenza   Pt redness improved on leg but now redness in buttock region   Review of Systems  Constitutional: Negative for chills, fever and weight loss.  HENT: Negative for congestion.   Eyes: Negative for blurred vision and double vision.  Respiratory: Negative for cough, sputum production, shortness of breath and wheezing.   Cardiovascular: Negative for chest pain, palpitations, orthopnea, leg swelling and PND.  Gastrointestinal: Negative for abdominal pain, blood in stool, constipation, diarrhea, nausea and vomiting.  Genitourinary: Negative for dysuria, frequency, hematuria and urgency.  Musculoskeletal: Negative for falls.  Skin: Positive for rash.  Neurological: Negative for dizziness, tremors, focal weakness and headaches.  Endo/Heme/Allergies: Does not bruise/bleed easily.  Psychiatric/Behavioral: Negative for depression. The patient does not have insomnia.     VITAL SIGNS: Blood pressure (!) 154/70, pulse 82, temperature 98 F (36.7 C), temperature source Oral, resp. rate 19, height 5' (1.524 m), weight 173 lb (78.5 kg), SpO2 97 %.  PHYSICAL EXAMINATION:   GENERAL:  76 y.o.-year-old patient lying in the bed with no acute distress.  EYES: Pupils equal, round, reactive to light and accommodation. No scleral icterus. Extraocular muscles intact.  HEENT: Head atraumatic, normocephalic. Oropharynx and nasopharynx clear.  NECK:  Supple, no jugular venous distention. No thyroid enlargement, no tenderness.  LUNGS: Normal breath sounds bilaterally, no wheezing, rales,rhonchi or crepitation. No use of accessory muscles of respiration.  CARDIOVASCULAR: S1, S2 normal. No murmurs, rubs, or gallops.  ABDOMEN: Soft, nontender, nondistended. Bowel sounds present. No  organomegaly or mass.  EXTREMITIES: Left lower extremity and pedal edema, erythema improved, swelling in the calf area, significant tenderness to palpation, no cyanosis, or clubbing. Erythema of right buttock NEUROLOGIC: Cranial nerves II through XII are intact. Muscle strength 5/5 in all extremities. Sensation intact. Gait not checked.  PSYCHIATRIC: The patient is alert and oriented x 3.  SKIN: No obvious rash, lesion, or ulcer.   ORDERS/RESULTS REVIEWED:   CBC  Recent Labs Lab 12/17/16 1328 12/18/16 0425 12/19/16 0348 12/20/16 1141  WBC 26.2* 21.3* 14.3* 7.2  HGB 12.7 11.4* 11.1* 11.2*  HCT 37.4 34.0* 33.1* 33.8*  PLT 244 208 200 252  MCV 85.5 86.7 87.1 87.2  MCH 29.0 29.1 29.3 29.0  MCHC 33.9 33.6 33.6 33.2  RDW 16.2* 16.1* 15.8* 15.8*  LYMPHSABS 0.4*  --   --  1.3  MONOABS 1.2*  --   --  0.7  EOSABS 0.0  --   --  0.2  BASOSABS 0.1  --   --  0.0   ------------------------------------------------------------------------------------------------------------------  Chemistries   Recent Labs Lab 12/17/16 1328 12/17/16 1816 12/20/16 0513  NA 135  --   --   K 3.1*  --  4.2  CL 97*  --   --   CO2 27  --   --   GLUCOSE 139*  --   --   BUN 11  --   --   CREATININE 0.88  --  0.83  CALCIUM 9.4  --   --   MG  --  1.6* 1.9  AST 56*  --   --   ALT 26  --   --   ALKPHOS 59  --   --   BILITOT 0.9  --   --    ------------------------------------------------------------------------------------------------------------------  estimated creatinine clearance is 54.3 mL/min (by C-G formula based on SCr of 0.83 mg/dL). ------------------------------------------------------------------------------------------------------------------ No results for input(s): TSH, T4TOTAL, T3FREE, THYROIDAB in the last 72 hours.  Invalid input(s): FREET3  Cardiac Enzymes  Recent Labs Lab 12/17/16 1328 12/17/16 1816  TROPONINI 0.05* <0.03    ------------------------------------------------------------------------------------------------------------------ Invalid input(s): POCBNP ---------------------------------------------------------------------------------------------------------------  RADIOLOGY: No results found.  EKG:  Orders placed or performed during the hospital encounter of 12/17/16  . ED EKG  . ED EKG    ASSESSMENT AND PLAN:  Active Problems:   Sepsis ([place])  #1. Streptococcus pyogenes sepsis due to left lower extremity cellulitis, leg improved now buttock region red #2. Left lower extremity cellulitis, continue antibiotic therapy, following culture results and sensitivities, now on cefazolin intravenously continue  #3. Hypokalemia, replaced #4. Hypotension, resolved, discontinue IV fluids  #5. Leukocytosis, improved #6. Elevated troponin, likely demand ischemia, no chest pains were reported  Management plans discussed with the patient,   DRUG ALLERGIES:  Allergies  Allergen Reactions  . Ace Inhibitors Cough  . Benicar [Olmesartan]   . Amoxicillin Rash  . Codeine Sulfate Rash  . Levaquin [Levofloxacin In D5w] Rash  . Penicillin V Potassium Rash  . Sulfa Antibiotics Rash    CODE STATUS:     Code Status Orders        Start     Ordered   12/17/16 1752  Full code  Continuous     12/17/16 1751    Code Status History    Date Active Date Inactive Code Status Order ID Comments User Context   This patient has a current code status but no historical code status.      TOTAL TIME TAKING CARE OF THIS PATIENT: 54minutes.    Dustin Flock M.D on 12/20/2016 at 12:19 PM  Between 7am to 6pm - Pager - [phone]  After 6pm go to [email or web address] - password [email or web address] [facility]  Office  [phone]  CC: Primary care physician; Madelyn Brunner, MD

## 2016-12-21 LAB — CBC
HCT: 34.1 % — ABNORMAL LOW (ref 35.0–47.0)
Hemoglobin: 11.9 g/dL — ABNORMAL LOW (ref 12.0–16.0)
MCH: 30.1 pg (ref 26.0–34.0)
MCHC: 34.9 g/dL (ref 32.0–36.0)
MCV: 86.3 fL (ref 80.0–100.0)
Platelets: 231 10*3/uL (ref 150–440)
RBC: 3.95 MIL/uL (ref 3.80–5.20)
RDW: 15.7 % — ABNORMAL HIGH (ref 11.5–14.5)
WBC: 6.6 10*3/uL (ref 3.6–11.0)

## 2016-12-21 MED ORDER — CEPHALEXIN 500 MG PO CAPS: 500.0000 mg | ORAL_CAPSULE | Freq: Four times a day (QID) | ORAL | 0 refills | Status: AC

## 2016-12-21 MED ORDER — PRAVASTATIN SODIUM 20 MG PO TABS: 40.0000 mg | ORAL_TABLET | Freq: Every day | ORAL | Status: DC

## 2016-12-21 MED ORDER — SERTRALINE HCL 50 MG PO TABS: 100.0000 mg | ORAL_TABLET | Freq: Every day | ORAL | Status: DC

## 2016-12-21 MED ORDER — CEPHALEXIN 500 MG PO CAPS: 500.0000 mg | ORAL_CAPSULE | Freq: Four times a day (QID) | ORAL | 0 refills | Status: DC

## 2016-12-21 NOTE — Progress Notes (Signed)
MD order received in [facility] to discharge pt home today; verbally reviewed AVS with pt, gave Rx for Keflex to pt; no questions voiced at this time; pt discharged via wheelchair to the visitor's entrance

## 2016-12-21 NOTE — Discharge Instructions (Signed)
[facility] - [place] at [facility] ° °DIET:  °Cardiac diet ° °DISCHARGE CONDITION:  °Stable ° °ACTIVITY:  °Activity as tolerated ° °OXYGEN:  °Home Oxygen: No. °  °Oxygen Delivery: room air ° °DISCHARGE LOCATION:  °home  ° ° °ADDITIONAL DISCHARGE INSTRUCTION: ° ° °If you experience worsening of your admission symptoms, develop shortness of breath, life threatening emergency, suicidal or homicidal thoughts you must seek medical attention immediately by calling 911 or calling your MD immediately  if symptoms less severe. ° °You Must read complete instructions/literature along with all the possible adverse reactions/side effects for all the Medicines you take and that have been prescribed to you. Take any new Medicines after you have completely understood and accpet all the possible adverse reactions/side effects.  ° °Please note ° °You were cared for by a hospitalist during your hospital stay. If you have any questions about your discharge medications or the care you received while you were in the hospital after you are discharged, you can call the unit and asked to speak with the hospitalist on call if the hospitalist that took care of you is not available. Once you are discharged, your primary care physician will handle any further medical issues. Please note that NO REFILLS for any discharge medications will be authorized once you are discharged, as it is imperative that you return to your primary care physician (or establish a relationship with a primary care physician if you do not have one) for your aftercare needs so that they can reassess your need for medications and monitor your lab values. ° ° °

## 2016-12-21 NOTE — Care Management Note (Signed)
Case Management Note  Patient Details  Name: Erin Sutton MRN: [id number] Date of Birth: Jan 10, 1941  Subjective/Objective:     Erin Sutton chose [facility] to be her home health provider earlier this week as documented in CM notes. A referral for home health PT and Aide was called to [facility]. No other discharge needs identified.                Action/Plan:   Expected Discharge Date:  12/21/16               Expected Discharge Plan:     In-House Referral:     Discharge planning Services     Post Acute Care Choice:    Choice offered to:     DME Arranged:    DME Agency:     HH Arranged:    HH Agency:     Status of Service:     If discussed at [employer], dates discussed:    Additional Comments:  Kaci Freel A, RN 12/21/2016, 11:38 AM

## 2016-12-21 NOTE — Discharge Summary (Signed)
Erin Sutton, 76 y.o., DOB [birth year], MRN [id number]. Admission date: 12/17/2016 Discharge Date 12/21/2016 Primary MD Madelyn Brunner, MD Admitting Physician Demetrios Loll, MD  Admission Diagnosis  Influenza-like illness [R69]  Discharge Diagnosis   Active Problems:   Sepsis [facility]) due to cellultis   Left lower ext cellulitis    Hypokalemia    hypotension   leukocytosis   Elevated troponin due to demand ischemia        [facility]  Erin Sutton  is a 76 y.o. female with a known history of Hypertension, CKD, GERD, hypothyroidism and sleep apnea. The patient is started to have nausea and vomiting multiple times since 3 AM today. She also has a fever and chills, cough, generalized weakness and muscle aches. She was found tachycardia, hypotension and leukocytosis in the ED. Chest x-ray didn't show any pneumonia, influenza test are negative and urinalysis is normal. Pt noted have left lower extremity cellulitis.  Patient subsequently noted to have blood cultures positive for strep pyogenes. Patient seen by Dr. Ola Spurr. Pt redness improved she will be discharge on oral antibiotic. Pt feeling better stable to discharge with home pt.              Consults  ID  Significant Tests:  See full reports for all details     Dg Chest 2 View  Result Date: 12/17/2016 CLINICAL DATA:  Fever and leukocytosis. EXAM: CHEST  2 VIEW COMPARISON:  11/03/2015 FINDINGS: There is no edema, consolidation, effusion, or pneumothorax. Normal heart size and mediastinal contours. Aortic arch atherosclerosis. 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 IMPRESSION: No active cardiopulmonary disease. Electronically Signed   By: Monte Fantasia M.D.   On: 12/17/2016 15:01   Ct Tibia Fibula Left W Contrast  Result Date: 12/18/2016 CLINICAL DATA:  Onset left lower leg redness and pain 12/17/2016. 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 No known injury. EXAM: CT OF THE LOWER RIGHT EXTREMITY WITH CONTRAST TECHNIQUE: Multidetector CT imaging of the lower right extremity was performed according to the standard protocol following intravenous contrast administration. COMPARISON:  Two views left knee 08/07/2014. 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 No hardware complication is identified. No bony destructive change or periosteal reaction is seen. Small osteochondral lesion medial talar dome is incidentally noted. Ligaments Suboptimally assessed by CT. Muscles and Tendons Intact. Fat planes within muscle are preserved. No intramuscular fluid collection or edematous change is identified. Soft tissues Subcutaneous fatty tissues appear mildly edematous about the ankle. No fluid collection is identified. Major vascular structures enhance normally. IMPRESSION: Mild infiltration of subcutaneous fatty tissues about the ankle could be due to dependent change or cellulitis. Negative for myositis, abscess or evidence of osteomyelitis. 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 Electronically Signed   By: Inge Rise M.D.   On: 12/18/2016 11:08   US Venous Img Lower Unilateral Left  Result Date: 12/18/2016 CLINICAL DATA:  Swelling x1 day, pain, color changes, on aspirin EXAM: LEFT LOWER EXTREMITY VENOUS DOPPLER ULTRASOUND TECHNIQUE: Gray-scale sonography with compression, as well as color and duplex ultrasound, were performed to evaluate the deep venous system from the level of the common femoral vein through the popliteal and proximal calf veins. COMPARISON:  None FINDINGS: Normal compressibility of the common femoral, superficial femoral, and popliteal veins, as well as the proximal calf veins. No filling defects to suggest DVT on  grayscale or color Doppler imaging. Doppler waveforms show normal direction of  venous flow, normal respiratory phasicity and response to augmentation. Survey views of the contralateral common femoral vein are unremarkable. IMPRESSION: No evidence of  lower extremity deep vein thrombosis, left. Electronically Signed   By: Lucrezia Europe M.D.   On: 12/18/2016 10:28   US Breast Ltd Uni Right Inc Axilla  Result Date: 12/03/2016 CLINICAL DATA:  Screening recall for a possible right breast mass. EXAM: 2D DIGITAL DIAGNOSTIC UNILATERAL RIGHT MAMMOGRAM WITH CAD AND ADJUNCT TOMO RIGHT BREAST ULTRASOUND COMPARISON:  Previous exam(s). ACR Breast Density Category c: The breast tissue is heterogeneously dense, which may obscure small masses. FINDINGS: In the lower-inner quadrant of the right breast, there is a 1.5 cm oval circumscribed mass. Mammographic images were processed with CAD. A mobile palpable mass is identified in the medial slightly lower aspect of the right breast. Ultrasound targeted to the right breast at 4 o'clock, 1 cm from the nipple demonstrates a the near anechoic circumscribed oval mass measuring 1.3 x 1.0 x 1.2 cm. Non mobile echogenic material is seen within the mass, which is favored to represent a complicated cyst, however the debris is not mobile. No blood flow seen within the mass on color Doppler imaging. IMPRESSION: The mass in the right breast most likely represents a complicated cyst, however aspiration is warranted. RECOMMENDATION: Ultrasound-guided aspiration is recommended for the right breast cyst at 4 o'clock. I have discussed the findings and recommendations with the patient. Results were also provided in writing at the conclusion of the visit. If applicable, a reminder letter will be sent to the patient regarding the next appointment. BI-RADS CATEGORY  4: Suspicious. Electronically Signed   By: Ammie Ferrier M.D.   On: 12/03/2016 15:25   Mm Diag Breast Tomo Uni Right  Result Date: 12/03/2016 CLINICAL DATA:  Screening recall for a possible right breast mass.  EXAM: 2D DIGITAL DIAGNOSTIC UNILATERAL RIGHT MAMMOGRAM WITH CAD AND ADJUNCT TOMO RIGHT BREAST ULTRASOUND COMPARISON:  Previous exam(s). ACR Breast Density Category c: The breast tissue is heterogeneously dense, which may obscure small masses. FINDINGS: In the lower-inner quadrant of the right breast, there is a 1.5 cm oval circumscribed mass. Mammographic images were processed with CAD. A mobile palpable mass is identified in the medial slightly lower aspect of the right breast. Ultrasound targeted to the right breast at 4 o'clock, 1 cm from the nipple demonstrates a the near anechoic circumscribed oval mass measuring 1.3 x 1.0 x 1.2 cm. Non mobile echogenic material is seen within the mass, which is favored to represent a complicated cyst, however the debris is not mobile. No blood flow seen within the mass on color Doppler imaging. IMPRESSION: The mass in the right breast most likely represents a complicated cyst, however aspiration is warranted. RECOMMENDATION: Ultrasound-guided aspiration is recommended for the right breast cyst at 4 o'clock. I have discussed the findings and recommendations with the patient. Results were also provided in writing at the conclusion of the visit. If applicable, a reminder letter will be sent to the patient regarding the next appointment. BI-RADS CATEGORY  4: Suspicious. Electronically Signed   By: Ammie Ferrier M.D.   On: 12/03/2016 15:25       Today   Subjective:   Karlyne Greenspan  Pt feels better  Objective:   Blood pressure (!) 169/77, pulse 66, temperature 97.8 F (36.6 C), temperature source Oral, resp. rate 19, height 5' (1.524 m), weight 173 lb (78.5 kg), SpO2 97 %.  Marland Kitchen  Intake/Output Summary (Last 24 hours) at 12/21/16 1200 Last data filed at 12/21/16 0504  Gross per 24 hour  Intake              850 ml  Output                0 ml  Net              850 ml    Exam VITAL SIGNS: Blood pressure (!) 169/77, pulse 66, temperature 97.8 F (36.6 C),  temperature source Oral, resp. rate 19, height 5' (1.524 m), weight 173 lb (78.5 kg), SpO2 97 %.  GENERAL:  76 y.o.-year-old patient lying in the bed with no acute distress.  EYES: Pupils equal, round, reactive to light and accommodation. No scleral icterus. Extraocular muscles intact.  HEENT: Head atraumatic, normocephalic. Oropharynx and nasopharynx clear.  NECK:  Supple, no jugular venous distention. No thyroid enlargement, no tenderness.  LUNGS: Normal breath sounds bilaterally, no wheezing, rales,rhonchi or crepitation. No use of accessory muscles of respiration.  CARDIOVASCULAR: S1, S2 normal. No murmurs, rubs, or gallops.  ABDOMEN: Soft, nontender, nondistended. Bowel sounds present. No organomegaly or mass.  EXTREMITIES:left leg redness improved NEUROLOGIC: Cranial nerves II through XII are intact. Muscle strength 5/5 in all extremities. Sensation intact. Gait not checked.  PSYCHIATRIC: The patient is alert and oriented x 3.  SKIN: No obvious rash, lesion, or ulcer.   Data Review     CBC w Diff: Lab Results  Component Value Date   WBC 6.6 12/21/2016   HGB 11.9 (L) 12/21/2016   HGB 11.8 (L) 08/09/2014   HCT 34.1 (L) 12/21/2016   HCT 33.9 (L) 08/08/2014   PLT 231 12/21/2016   PLT 211 08/08/2014   LYMPHOPCT 17 12/20/2016   LYMPHOPCT 14.7 08/08/2014   MONOPCT 9 12/20/2016   MONOPCT 9.2 08/08/2014   EOSPCT 2 12/20/2016   EOSPCT 2.5 08/08/2014   BASOPCT 1 12/20/2016   BASOPCT 0.4 08/08/2014   CMP: Lab Results  Component Value Date   NA 135 12/17/2016   NA 138 08/08/2014   K 4.2 12/20/2016   K 3.4 (L) 08/08/2014   CL 97 (L) 12/17/2016   CL 102 08/08/2014   CO2 27 12/17/2016   CO2 30 08/08/2014   BUN 11 12/17/2016   BUN 7 08/08/2014   CREATININE 0.83 12/20/2016   CREATININE 0.88 08/08/2014   PROT 8.5 (H) 12/17/2016   PROT 7.7 12/18/2012   ALBUMIN 4.5 12/17/2016   ALBUMIN 4.1 12/18/2012   BILITOT 0.9 12/17/2016   BILITOT 0.7 12/18/2012   ALKPHOS 59 12/17/2016    ALKPHOS 92 12/18/2012   AST 56 (H) 12/17/2016   AST 32 12/18/2012   ALT 26 12/17/2016   ALT 33 12/18/2012  .  Micro Results Recent Results (from the past 240 hour(s))  Culture, blood (routine x 2)     Status: None (Preliminary result)   Collection Time: 12/17/16  1:33 PM  Result Value Ref Range Status   Specimen Description BLOOD L AC  Final   Special Requests   Final    BOTTLES DRAWN AEROBIC AND ANAEROBIC AER 13ML ANA 14ML   Culture NO GROWTH 3 DAYS  Final   Report Status PENDING  Incomplete  Culture, blood (routine x 2)     Status: Abnormal   Collection Time: 12/17/16  1:34 PM  Result Value Ref Range Status   Specimen Description BLOOD R [facility]  Final   Special Requests   Final  BOTTLES DRAWN AEROBIC AND ANAEROBIC AER 14ML ANA 15ML   Culture  Setup Time   Final    GRAM POSITIVE COCCI ANAEROBIC BOTTLE ONLY CRITICAL RESULT CALLED TO, READ BACK BY AND VERIFIED WITH:  MATT MCBANE AT 0700 12/18/16 SDR    Culture (A)  Final    GROUP A STREP (S.PYOGENES) ISOLATED HEALTH DEPARTMENT NOTIFIED Performed at [facility], [place] [address], [place], [place] [address]    Report Status 12/20/2016 FINAL  Final   Organism ID, Bacteria GROUP A STREP (S.PYOGENES) ISOLATED  Final      Susceptibility   Group a strep (s.pyogenes) isolated - MIC*    ERYTHROMYCIN <=0.12 SENSITIVE Sensitive     TETRACYCLINE <=0.25 SENSITIVE Sensitive     VANCOMYCIN <=0.12 SENSITIVE Sensitive     CLINDAMYCIN <=0.25 SENSITIVE Sensitive     PENICILLIN Value in next row Sensitive      SENSITIVE<=0.06    CEFTRIAXONE Value in next row Sensitive      SENSITIVE<=0.12    * GROUP A STREP (S.PYOGENES) ISOLATED  Urine culture     Status: Abnormal   Collection Time: 12/17/16  1:34 PM  Result Value Ref Range Status   Specimen Description URINE, CLEAN CATCH  Final   Special Requests NONE  Final   Culture MULTIPLE SPECIES PRESENT, SUGGEST RECOLLECTION (A)  Final   Report Status 12/18/2016 FINAL  Final  Blood Culture  ID Panel (Reflexed)     Status: Abnormal   Collection Time: 12/17/16  1:34 PM  Result Value Ref Range Status   Enterococcus species NOT DETECTED NOT DETECTED Final   Listeria monocytogenes NOT DETECTED NOT DETECTED Final   Staphylococcus species NOT DETECTED NOT DETECTED Final   Staphylococcus aureus NOT DETECTED NOT DETECTED Final   Streptococcus species DETECTED (A) NOT DETECTED Final    Comment: CRITICAL RESULT CALLED TO, READ BACK BY AND VERIFIED WITH:  MATT MCBANE AT 0700 12/18/16 SDR    Streptococcus agalactiae NOT DETECTED NOT DETECTED Final   Streptococcus pneumoniae NOT DETECTED NOT DETECTED Final   Streptococcus pyogenes DETECTED (A) NOT DETECTED Final    Comment: CRITICAL RESULT CALLED TO, READ BACK BY AND VERIFIED WITH:  MATT MCBANE AT 0700 12/18/16 SDR    Acinetobacter baumannii NOT DETECTED NOT DETECTED Final   Enterobacteriaceae species NOT DETECTED NOT DETECTED Final   Enterobacter cloacae complex NOT DETECTED NOT DETECTED Final   Escherichia coli NOT DETECTED NOT DETECTED Final   Klebsiella oxytoca NOT DETECTED NOT DETECTED Final   Klebsiella pneumoniae NOT DETECTED NOT DETECTED Final   Proteus species NOT DETECTED NOT DETECTED Final   Serratia marcescens NOT DETECTED NOT DETECTED Final   Haemophilus influenzae NOT DETECTED NOT DETECTED Final   Neisseria meningitidis NOT DETECTED NOT DETECTED Final   Pseudomonas aeruginosa NOT DETECTED NOT DETECTED Final   Candida albicans NOT DETECTED NOT DETECTED Final   Candida glabrata NOT DETECTED NOT DETECTED Final   Candida krusei NOT DETECTED NOT DETECTED Final   Candida parapsilosis NOT DETECTED NOT DETECTED Final   Candida tropicalis NOT DETECTED NOT DETECTED Final  CULTURE, BLOOD (ROUTINE X 2) w Reflex to ID Panel     Status: None (Preliminary result)   Collection Time: 12/18/16  6:10 PM  Result Value Ref Range Status   Specimen Description BLOOD  RIGHT FOERARM  Final   Special Requests   Final    BOTTLES DRAWN AEROBIC  AND ANAEROBIC  AER 7 ML ANA 3 ML   Culture NO GROWTH 2  DAYS  Final   Report Status PENDING  Incomplete  CULTURE, BLOOD (ROUTINE X 2) w Reflex to ID Panel     Status: None (Preliminary result)   Collection Time: 12/18/16  6:10 PM  Result Value Ref Range Status   Specimen Description BLOOD  RIGHT AC  Final   Special Requests   Final    BOTTLES DRAWN AEROBIC AND ANAEROBIC  AER 8 ML ANA 7 ML    Culture NO GROWTH 2 DAYS  Final   Report Status PENDING  Incomplete        Code Status Orders        Start     Ordered   12/17/16 1752  Full code  Continuous     12/17/16 1751    Code Status History    Date Active Date Inactive Code Status Order ID Comments User Context   This patient has a current code status but no historical code status.          Follow-up Information    Sarina Ser, Hewitt Blade, MD Follow up in 4 day(s).   Specialty:  Internal Medicine Contact information: [place] [address] [phone]        Leonel Ramsay, MD Follow up in 10 day(s).   Specialty:  Infectious Diseases Contact information: [place] [address] [phone]           Discharge Medications   Allergies as of 12/21/2016      Reactions   Ace Inhibitors Cough   Benicar [olmesartan]    Amoxicillin Rash   Codeine Sulfate Rash   Levaquin [levofloxacin In D5w] Rash   Penicillin V Potassium Rash   Sulfa Antibiotics Rash      Medication List    TAKE these medications   amLODipine 10 MG tablet Commonly known as:  NORVASC Take 10 mg by mouth daily.   aspirin EC 81 MG tablet Take 81 mg by mouth daily.   CENTRUM SILVER 50+WOMEN Tabs Take 1 tablet by mouth daily.   cephALEXin 500 MG capsule Commonly known as:  KEFLEX Take 1 capsule (500 mg total) by mouth 4 (four) times daily.   isosorbide mononitrate 30 MG 24 hr tablet Commonly known as:  IMDUR Take 30 mg by  mouth daily.   levothyroxine 50 MCG tablet Commonly known as:  SYNTHROID, LEVOTHROID Take 50 mcg by mouth daily before breakfast.   LORazepam 2 MG tablet Commonly known as:  ATIVAN Take 2 mg by mouth every 8 (eight) hours as needed for anxiety.   magnesium oxide 400 MG tablet Commonly known as:  MAG-OX Take 400 mg by mouth daily.   Multiple Vitamin-Folic Acid Tabs Take by mouth every morning.   ondansetron 4 MG tablet Commonly known as:  ZOFRAN Take 1 tablet (4 mg total) by mouth daily as needed for nausea or vomiting.   pantoprazole 40 MG tablet Commonly known as:  PROTONIX Take 40 mg by mouth daily.   potassium chloride SA 20 MEQ tablet Commonly known as:  K-DUR,KLOR-CON Take 20 mEq by mouth every morning.   pravastatin 40 MG tablet Commonly known as:  PRAVACHOL Take 40 mg by mouth at bedtime.   sertraline 100 MG tablet Commonly known as:  ZOLOFT Take 100 mg by mouth at bedtime.          Total Time in preparing paper work, data evaluation and todays exam - 35 minutes  Dustin Flock M.D on 12/21/2016 at 12:00 PM  [place]  [phone]

## 2016-12-22 LAB — CULTURE, BLOOD (ROUTINE X 2): Culture: NO GROWTH

## 2016-12-23 LAB — CULTURE, BLOOD (ROUTINE X 2)
Culture: NO GROWTH
Culture: NO GROWTH

## 2016-12-31 DIAGNOSIS — T59811A Toxic effect of smoke, accidental (unintentional), initial encounter: Secondary | ICD-10-CM | POA: Insufficient documentation

## 2017-01-01 DIAGNOSIS — F0390 Unspecified dementia without behavioral disturbance: Secondary | ICD-10-CM | POA: Insufficient documentation

## 2017-01-01 DIAGNOSIS — R296 Repeated falls: Secondary | ICD-10-CM | POA: Insufficient documentation

## 2017-02-10 DIAGNOSIS — M810 Age-related osteoporosis without current pathological fracture: Secondary | ICD-10-CM | POA: Insufficient documentation

## 2017-02-24 DIAGNOSIS — E559 Vitamin D deficiency, unspecified: Secondary | ICD-10-CM | POA: Insufficient documentation

## 2017-02-24 DIAGNOSIS — E213 Hyperparathyroidism, unspecified: Secondary | ICD-10-CM | POA: Insufficient documentation

## 2017-04-10 ENCOUNTER — Other Ambulatory Visit: Payer: Self-pay | Admitting: Orthopedic Surgery

## 2017-04-10 ENCOUNTER — Other Ambulatory Visit: Payer: Self-pay | Admitting: Physician Assistant

## 2017-04-10 DIAGNOSIS — S22050D Wedge compression fracture of T5-T6 vertebra, subsequent encounter for fracture with routine healing: Secondary | ICD-10-CM

## 2017-04-13 ENCOUNTER — Ambulatory Visit: Payer: Medicare PPO

## 2017-05-14 ENCOUNTER — Emergency Department
Admission: EM | Admit: 2017-05-14 | Discharge: 2017-05-15 | Disposition: A | Discharge status: Home / Self Care | Payer: Medicare PPO | Attending: Emergency Medicine | Admitting: Emergency Medicine

## 2017-05-14 ENCOUNTER — Encounter: Payer: Self-pay | Admitting: Emergency Medicine

## 2017-05-14 DIAGNOSIS — Z7982 Long term (current) use of aspirin: Secondary | ICD-10-CM | POA: Diagnosis not present

## 2017-05-14 DIAGNOSIS — R42 Dizziness and giddiness: Secondary | ICD-10-CM | POA: Diagnosis not present

## 2017-05-14 DIAGNOSIS — R51 Headache: Secondary | ICD-10-CM | POA: Insufficient documentation

## 2017-05-14 DIAGNOSIS — I10 Essential (primary) hypertension: Secondary | ICD-10-CM

## 2017-05-14 DIAGNOSIS — N189 Chronic kidney disease, unspecified: Secondary | ICD-10-CM | POA: Insufficient documentation

## 2017-05-14 DIAGNOSIS — I129 Hypertensive chronic kidney disease with stage 1 through stage 4 chronic kidney disease, or unspecified chronic kidney disease: Secondary | ICD-10-CM | POA: Insufficient documentation

## 2017-05-14 DIAGNOSIS — Z79899 Other long term (current) drug therapy: Secondary | ICD-10-CM | POA: Insufficient documentation

## 2017-05-14 DIAGNOSIS — Z7902 Long term (current) use of antithrombotics/antiplatelets: Secondary | ICD-10-CM | POA: Diagnosis not present

## 2017-05-14 LAB — URINALYSIS, COMPLETE (UACMP) WITH MICROSCOPIC
Bacteria, UA: NONE SEEN
Bilirubin Urine: NEGATIVE
Glucose, UA: NEGATIVE mg/dL
Hgb urine dipstick: NEGATIVE
Ketones, ur: NEGATIVE mg/dL
Leukocytes, UA: NEGATIVE
Nitrite: NEGATIVE
Protein, ur: NEGATIVE mg/dL
Specific Gravity, Urine: 1.009 (ref 1.005–1.030)
Squamous Epithelial / LPF: NONE SEEN
pH: 8 (ref 5.0–8.0)

## 2017-05-14 LAB — BASIC METABOLIC PANEL
Anion gap: 8 (ref 5–15)
BUN: 14 mg/dL (ref 6–20)
CO2: 27 mmol/L (ref 22–32)
Calcium: 10.3 mg/dL (ref 8.9–10.3)
Chloride: 102 mmol/L (ref 101–111)
Creatinine, Ser: 0.7 mg/dL (ref 0.44–1.00)
GFR calc Af Amer: >60 mL/min (ref >60)
GFR calc non Af Amer: >60 mL/min (ref >60)
Glucose, Bld: 97 mg/dL (ref 65–99)
Potassium: 3.8 mmol/L (ref 3.5–5.1)
Sodium: 137 mmol/L (ref 135–145)

## 2017-05-14 LAB — TROPONIN I: Troponin I: <0.03 ng/mL (ref <0.03)

## 2017-05-14 LAB — CBC
HCT: 37.5 % (ref 35.0–47.0)
Hemoglobin: 12.5 g/dL (ref 12.0–16.0)
MCH: 29.1 pg (ref 26.0–34.0)
MCHC: 33.4 g/dL (ref 32.0–36.0)
MCV: 87.2 fL (ref 80.0–100.0)
Platelets: 271 10*3/uL (ref 150–440)
RBC: 4.3 MIL/uL (ref 3.80–5.20)
RDW: 16.2 % — ABNORMAL HIGH (ref 11.5–14.5)
WBC: 6.8 10*3/uL (ref 3.6–11.0)

## 2017-05-14 MED ORDER — LABETALOL HCL 100 MG PO TABS
100.0000 mg | ORAL_TABLET | Freq: Once | ORAL | Status: AC
  Administered 2017-05-15: 100 mg via ORAL
  Filled 2017-05-14 (×2): qty 1

## 2017-05-14 NOTE — ED Provider Notes (Signed)
[facility] Emergency Department Provider Note   ____________________________________________   First MD Initiated Contact with Patient 05/14/17 2306     (approximate)  I have reviewed the triage vital signs and the nursing notes.   HISTORY  Chief Complaint Hypertension    HPI Erin Sutton is a 76 y.o. female who comes into the hospital today with elevated blood pressure. The patient reports that her blood pressures been running high all day and she didn't want to come. She's been in and out of the hospital. The fire department told her to come because she could have a stroke or heart attack with her blood pressure being so high. The patient got up out of bed today feeling dizzy. She reports that that is uncommon for her so she checked her blood pressure and it was 195/100. The patient drank some water as she reports that sometimes helps her blood pressure decreased. She went back to sleep but 2 hours later her blood pressure was still elevated. The patient reports that at that time she had only just taken her thyroid medicine. She reports that she did not call her primary care physician. She has since taken all of her medications and her blood pressure is still high. The patient had a mild headache but denies chest pain, shortness of breath or unilateral weakness. The patient's family made her come into the hospital today for evaluation.   Past Medical History:  Diagnosis Date  . Anxiety   . Arthritis   . Chronic kidney disease   . Constipation   . GERD (gastroesophageal reflux disease)   . Hypertension   . Hypothyroidism   . Sleep apnea     Patient Active Problem List   Diagnosis Date Noted  . Sepsis ([place]) 12/17/2016    Past Surgical History:  Procedure Laterality Date  . BREAST EXCISIONAL BIOPSY Right    neg  . COLONOSCOPY WITH PROPOFOL N/A 07/02/2015   Procedure: COLONOSCOPY WITH PROPOFOL;  Surgeon: Manya Silvas, MD;  Location: [facility]  ENDOSCOPY;  Service: Endoscopy;  Laterality: N/A;  . CORRECTION HAMMER TOE Right   . ESOPHAGOGASTRODUODENOSCOPY (EGD) WITH PROPOFOL N/A 07/02/2015   Procedure: ESOPHAGOGASTRODUODENOSCOPY (EGD) WITH PROPOFOL;  Surgeon: Manya Silvas, MD;  Location: [facility] ENDOSCOPY;  Service: Endoscopy;  Laterality: N/A;  . JOINT REPLACEMENT Left    knee    Prior to Admission medications   Medication Sig Start Date End Date Taking? Authorizing Provider  amLODipine (NORVASC) 10 MG tablet Take 10 mg by mouth daily.    [provider]  aspirin EC 81 MG tablet Take 81 mg by mouth daily.    [provider]  isosorbide mononitrate (IMDUR) 30 MG 24 hr tablet Take 30 mg by mouth daily.    [provider]  levothyroxine (SYNTHROID, LEVOTHROID) 50 MCG tablet Take 50 mcg by mouth daily before breakfast.    [provider]  LORazepam (ATIVAN) 2 MG tablet Take 2 mg by mouth every 8 (eight) hours as needed for anxiety.     [provider]  magnesium oxide (MAG-OX) 400 MG tablet Take 400 mg by mouth daily.    [provider]  Multiple Vitamin-Folic Acid TABS Take by mouth every morning.    [provider]  Multiple Vitamins-Minerals (CENTRUM SILVER 50+WOMEN) TABS Take 1 tablet by mouth daily.    [provider]  ondansetron (ZOFRAN) 4 MG tablet Take 1 tablet (4 mg total) by mouth daily as needed for nausea or  vomiting. 11/08/15   Earleen Newport, MD  pantoprazole (PROTONIX) 40 MG tablet Take 40 mg by mouth daily.    [provider]  potassium chloride SA (K-DUR,KLOR-CON) 20 MEQ tablet Take 20 mEq by mouth every morning.    [provider]  pravastatin (PRAVACHOL) 40 MG tablet Take 40 mg by mouth at bedtime.     [provider]  sertraline (ZOLOFT) 100 MG tablet Take 100 mg by mouth at bedtime.     [provider]    Allergies Ace inhibitors; Benicar [olmesartan]; Amoxicillin; Codeine sulfate; Levaquin  [levofloxacin in d5w]; Penicillin v potassium; and Sulfa antibiotics  Family History  Problem Relation Age of Onset  . Hypertension Mother   . Diabetes Mother   . Heart attack Mother   . Asthma Mother   . Peripheral vascular disease Mother   . Heart attack Father   . Hypertension Father   . Diabetes Father     Social History Social History  Substance Use Topics  . Smoking status: Never Smoker  . Smokeless tobacco: Never Used  . Alcohol use No    Review of Systems  Constitutional: No fever/chills Eyes: No visual changes. ENT: No sore throat. Cardiovascular: Denies chest pain. Respiratory: Denies shortness of breath. Gastrointestinal: No abdominal pain.  No nausea, no vomiting.  No diarrhea.  No constipation. Genitourinary: Negative for dysuria. Musculoskeletal: Negative for back pain. Skin: Negative for rash. Neurological: dizziness, headache   ____________________________________________   PHYSICAL EXAM:  VITAL SIGNS: ED Triage Vitals [05/14/17 2104]  Enc Vitals Group     BP (!) 189/102     Pulse Rate 86     Resp 16     Temp 98.5 F (36.9 C)     Temp Source Oral     SpO2 97 %     Weight 163 lb (73.9 kg)     Height 5' (1.524 m)     Head Circumference      Peak Flow      Pain Score 0     Pain Loc      Pain Edu?      Excl. in [place]?     Constitutional: Alert and oriented. Well appearing and in mild distress. Eyes: Conjunctivae are normal. PERRL. EOMI. Head: Atraumatic. Nose: No congestion/rhinnorhea. Mouth/Throat: Mucous membranes are moist.  Oropharynx non-erythematous. Cardiovascular: Normal rate, regular rhythm. Grossly normal heart sounds.  Good peripheral circulation. Respiratory: Normal respiratory effort.  No retractions. Lungs CTAB. Gastrointestinal: Soft and nontender. No distention. Positive bowel sounds Musculoskeletal: No lower extremity tenderness nor edema.  No joint effusions. Neurologic:  Normal speech and language. Cranial nerves II  through XII are grossly intact with no focal motor or neuro deficits Skin:  Skin is warm, dry and intact.  Psychiatric: Mood and affect are normal.   ____________________________________________   LABS (all labs ordered are listed, but only abnormal results are displayed)  Labs Reviewed  CBC - Abnormal; Notable for the following:       Result Value   RDW 16.2 (*)    All other components within normal limits  URINALYSIS, COMPLETE (UACMP) WITH MICROSCOPIC - Abnormal; Notable for the following:    Color, Urine YELLOW (*)    APPearance CLEAR (*)    All other components within normal limits  BASIC METABOLIC PANEL  TROPONIN I  TROPONIN I   ____________________________________________  EKG  ED ECG REPORT I, Loney Hering, the attending physician, personally viewed and interpreted this ECG.  Date: 05/14/2017  EKG Time: 2110  Rate: 71  Rhythm: normal sinus rhythm  Axis: normal  Intervals:incomplete right bundle branch block  ST&T Change: flipped t waves in lead III, V1, V2 same in 2015  ____________________________________________  RADIOLOGY  Mr Jodene Nam Head Wo Contrast  Result Date: 05/15/2017 CLINICAL DATA:  Initial evaluation for acute dizziness EXAM: MRI HEAD WITHOUT CONTRAST MRA HEAD WITHOUT CONTRAST MRA NECK WITHOUT CONTRAST TECHNIQUE: Multiplanar, multiecho pulse sequences of the brain and surrounding structures were obtained without intravenous contrast. Angiographic images of the Circle of Willis were obtained using MRA technique without intravenous contrast. Angiographic images of the neck were obtained using MRA technique without intravenous contrast. Carotid stenosis measurements (when applicable) are obtained utilizing NASCET criteria, using the distal internal carotid diameter as the denominator. CONTRAST:  None. COMPARISON:  Prior CT from 08/05/2010. FINDINGS: MRI HEAD FINDINGS Diffuse prominence of the CSF containing spaces is compatible with generalized  age-related cerebral atrophy. Patchy T2/FLAIR hyperintensity within the periventricular white matter most consistent with chronic small vessel ischemic disease, mild for age. Few small foci of encephalomalacia with gliosis within the left parasagittal parieto-occipital region compatible with small remote ischemic infarcts (series 15, image 32, 25). Additional small remote lacunar infarct present within the left thalamus. No abnormal foci of restricted diffusion to suggest acute or subacute ischemia. Gray-white matter differentiation well maintained. No other areas of remote infarction. No evidence for acute or chronic intracranial hemorrhage. No mass lesion, midline shift or mass effect. No hydrocephalus. No extra-axial fluid collection. Major dural sinuses are grossly patent. Pituitary gland suprasellar region within normal limits. Midline structures intact and normal. Major intracranial vascular flow voids maintained. Craniocervical junction normal. Visualized upper cervical spine unremarkable. Bone marrow signal intensity somewhat diffusely decreased on T1 weighted imaging, most commonly related to anemia, smoking, or obesity. Scalp soft tissues unremarkable. Globes normal soft tissues normal. Patient status post lens extraction bilaterally. Paranasal sinuses are clear. No mastoid effusion. Inner ear structures normal. MRA HEAD FINDINGS ANTERIOR CIRCULATION: Distal cervical segments of the internal carotid arteries are patent with antegrade flow. Petrous, cavernous, and supraclinoid segments patent without flow-limiting stenosis. Mild atheromatous irregularity noted within the carotid siphons bilaterally. ICA termini widely patent. A1 segments patent bilaterally. Anterior cerebral arteries patent to their distal aspects. There is a short-segment severe stenosis involving the mid left A2 segment (series 103, image 9). M1 segments demonstrate mild atheromatous irregularity but are widely patent without  flow-limiting stenosis or occlusion. MCA bifurcations normal. No proximal M2 occlusion. Distal MCA branches well opacified and symmetric. POSTERIOR CIRCULATION: Visualized V4 segments widely patent to the vertebrobasilar junction. Right PICA patent proximally. Left PICA not visualized. Basilar artery widely patent to its distal aspect. Superior cerebral arteries patent bilaterally. Left PCA supplied via the basilar and is widely patent to its distal aspect. Short-segment mild left P2 stenosis noted (series 107, image 8). Mildly hypoplastic right P1 segment with prominent right posterior communicating artery. Right PCA also widely patent to its distal aspect. No significant stenosis within the vertebrobasilar system to suggest vertebrobasilar insufficiency. No aneurysm or vascular abnormality. MRA NECK FINDINGS Study mildly limited due to lack of IV contrast. Aortic arch and proximal great vessels not well evaluated on this exam. Visualize right common carotid artery widely patent to the carotid bifurcation. Short-segment moderate stenosis of approximately 50% at the distal right common carotid artery and proximal right internal carotid artery (series 21, image 59, 55) right ICA patent distally to the skullbase without additional stenosis. Visualized left common  carotid artery mildly tortuous proximally, but is widely patent to the vertebrobasilar junction. Mild atheromatous irregularity about the left carotid bifurcation without flow-limiting stenosis. Left ICA widely patent distally to the skullbase. Origin of the vertebral arteries not evaluated on this exam. Possible short-segment moderate to severe right V1 stenosis (series 109, image 14). Additional more mild to moderate narrowing slightly distally within the proximal right V2 region (series 109, image 16). Right vertebral artery otherwise patent with antegrade flow without additional significant stenosis. Left vertebral artery slightly dominant. Possible  single short-segment mild to moderate stenosis at the proximal left V2 segment (series 108, image 6). Additional short-segment mild stenosis within the mid left P2 segment (series 108, image 119). Short-segment mild narrowing at the proximal left V3 segment (series 21, image 27). Left vertebral artery otherwise patent to the skullbase. IMPRESSION: MRI HEAD IMPRESSION: 1. No acute intracranial process. 2. 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 3. Mild chronic microvascular ischemic disease for age. MRA HEAD IMPRESSION: 1. Negative intracranial MRA for large vessel occlusion. Intracranial vertebrobasilar system is widely patent. 2. Short-segment severe mid left A2 stenosis, with additional short-segment mild left P2 stenosis. 3. Mild atheromatous irregularity within the carotid siphons without flow-limiting stenosis. MRA NECK IMPRESSION: 1. Limited study due to lack of IV contrast. 2. Patent vertebral arteries within the neck. Question few scattered predominantly proximal stenoses as above, not entirely certain given at the proximal great vessels are not well evaluated on this exam. Confirmation of these findings could be performed with a follow-up CTA as desired. 3. Short-segment moderate stenosis of approximately 50% about the right carotid bifurcation, involving the distal right common and proximal right internal carotid artery's. 4. No significant flow-limiting stenosis within the left carotid artery system. Electronically Signed   By: Jeannine Boga M.D.   On: 05/15/2017 05:57   Mr Jodene Nam Neck Wo Contrast  Result Date: 05/15/2017 CLINICAL DATA:  Initial evaluation for acute dizziness EXAM: MRI HEAD WITHOUT CONTRAST MRA HEAD WITHOUT CONTRAST MRA NECK WITHOUT CONTRAST TECHNIQUE: Multiplanar, multiecho pulse sequences of the brain and surrounding structures were obtained without intravenous contrast. Angiographic images of the Circle  of Willis were obtained using MRA technique without intravenous contrast. Angiographic images of the neck were obtained using MRA technique without intravenous contrast. Carotid stenosis measurements (when applicable) are obtained utilizing NASCET criteria, using the distal internal carotid diameter as the denominator. CONTRAST:  None. COMPARISON:  Prior CT from 08/05/2010. FINDINGS: MRI HEAD FINDINGS Diffuse prominence of the CSF containing spaces is compatible with generalized age-related cerebral atrophy. Patchy T2/FLAIR hyperintensity within the periventricular white matter most consistent with chronic small vessel ischemic disease, mild for age. Few small foci of encephalomalacia with gliosis within the left parasagittal parieto-occipital region compatible with small remote ischemic infarcts (series 15, image 32, 25). Additional small remote lacunar infarct present within the left thalamus. No abnormal foci of restricted diffusion to suggest acute or subacute ischemia. Gray-white matter differentiation well maintained. No other areas of remote infarction. No evidence for acute or chronic intracranial hemorrhage. No mass lesion, midline shift or mass effect. No hydrocephalus. No extra-axial fluid collection. Major dural sinuses are grossly patent. Pituitary gland suprasellar region within normal limits. Midline structures intact and normal. Major intracranial vascular flow voids maintained. Craniocervical junction normal. Visualized upper cervical spine unremarkable. Bone marrow signal intensity somewhat diffusely decreased on T1 weighted imaging, most commonly related to anemia, smoking, or obesity. Scalp soft tissues unremarkable. Globes normal soft  tissues normal. Patient status post lens extraction bilaterally. Paranasal sinuses are clear. No mastoid effusion. Inner ear structures normal. MRA HEAD FINDINGS ANTERIOR CIRCULATION: Distal cervical segments of the internal carotid arteries are patent with  antegrade flow. Petrous, cavernous, and supraclinoid segments patent without flow-limiting stenosis. Mild atheromatous irregularity noted within the carotid siphons bilaterally. ICA termini widely patent. A1 segments patent bilaterally. Anterior cerebral arteries patent to their distal aspects. There is a short-segment severe stenosis involving the mid left A2 segment (series 103, image 9). M1 segments demonstrate mild atheromatous irregularity but are widely patent without flow-limiting stenosis or occlusion. MCA bifurcations normal. No proximal M2 occlusion. Distal MCA branches well opacified and symmetric. POSTERIOR CIRCULATION: Visualized V4 segments widely patent to the vertebrobasilar junction. Right PICA patent proximally. Left PICA not visualized. Basilar artery widely patent to its distal aspect. Superior cerebral arteries patent bilaterally. Left PCA supplied via the basilar and is widely patent to its distal aspect. Short-segment mild left P2 stenosis noted (series 107, image 8). Mildly hypoplastic right P1 segment with prominent right posterior communicating artery. Right PCA also widely patent to its distal aspect. No significant stenosis within the vertebrobasilar system to suggest vertebrobasilar insufficiency. No aneurysm or vascular abnormality. MRA NECK FINDINGS Study mildly limited due to lack of IV contrast. Aortic arch and proximal great vessels not well evaluated on this exam. Visualize right common carotid artery widely patent to the carotid bifurcation. Short-segment moderate stenosis of approximately 50% at the distal right common carotid artery and proximal right internal carotid artery (series 21, image 59, 55) right ICA patent distally to the skullbase without additional stenosis. Visualized left common carotid artery mildly tortuous proximally, but is widely patent to the vertebrobasilar junction. Mild atheromatous irregularity about the left carotid bifurcation without flow-limiting  stenosis. Left ICA widely patent distally to the skullbase. Origin of the vertebral arteries not evaluated on this exam. Possible short-segment moderate to severe right V1 stenosis (series 109, image 14). Additional more mild to moderate narrowing slightly distally within the proximal right V2 region (series 109, image 16). Right vertebral artery otherwise patent with antegrade flow without additional significant stenosis. Left vertebral artery slightly dominant. Possible single short-segment mild to moderate stenosis at the proximal left V2 segment (series 108, image 6). Additional short-segment mild stenosis within the mid left P2 segment (series 108, image 119). Short-segment mild narrowing at the proximal left V3 segment (series 21, image 27). Left vertebral artery otherwise patent to the skullbase. IMPRESSION: MRI HEAD IMPRESSION: 1. No acute intracranial process. 2. 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 3. Mild chronic microvascular ischemic disease for age. MRA HEAD IMPRESSION: 1. Negative intracranial MRA for large vessel occlusion. Intracranial vertebrobasilar system is widely patent. 2. Short-segment severe mid left A2 stenosis, with additional short-segment mild left P2 stenosis. 3. Mild atheromatous irregularity within the carotid siphons without flow-limiting stenosis. MRA NECK IMPRESSION: 1. Limited study due to lack of IV contrast. 2. Patent vertebral arteries within the neck. Question few scattered predominantly proximal stenoses as above, not entirely certain given at the proximal great vessels are not well evaluated on this exam. Confirmation of these findings could be performed with a follow-up CTA as desired. 3. Short-segment moderate stenosis of approximately 50% about the right carotid bifurcation, involving the distal right common and proximal right internal carotid artery's. 4. No significant flow-limiting stenosis  within the left carotid artery system. Electronically Signed   By: Pincus Badder.D.  On: 05/15/2017 05:57   Mr Brain Wo Contrast  Result Date: 05/15/2017 CLINICAL DATA:  Initial evaluation for acute dizziness EXAM: MRI HEAD WITHOUT CONTRAST MRA HEAD WITHOUT CONTRAST MRA NECK WITHOUT CONTRAST TECHNIQUE: Multiplanar, multiecho pulse sequences of the brain and surrounding structures were obtained without intravenous contrast. Angiographic images of the Circle of Willis were obtained using MRA technique without intravenous contrast. Angiographic images of the neck were obtained using MRA technique without intravenous contrast. Carotid stenosis measurements (when applicable) are obtained utilizing NASCET criteria, using the distal internal carotid diameter as the denominator. CONTRAST:  None. COMPARISON:  Prior CT from 08/05/2010. FINDINGS: MRI HEAD FINDINGS Diffuse prominence of the CSF containing spaces is compatible with generalized age-related cerebral atrophy. Patchy T2/FLAIR hyperintensity within the periventricular white matter most consistent with chronic small vessel ischemic disease, mild for age. Few small foci of encephalomalacia with gliosis within the left parasagittal parieto-occipital region compatible with small remote ischemic infarcts (series 15, image 32, 25). Additional small remote lacunar infarct present within the left thalamus. No abnormal foci of restricted diffusion to suggest acute or subacute ischemia. Gray-white matter differentiation well maintained. No other areas of remote infarction. No evidence for acute or chronic intracranial hemorrhage. No mass lesion, midline shift or mass effect. No hydrocephalus. No extra-axial fluid collection. Major dural sinuses are grossly patent. Pituitary gland suprasellar region within normal limits. Midline structures intact and normal. Major intracranial vascular flow voids maintained. Craniocervical junction normal. Visualized upper  cervical spine unremarkable. Bone marrow signal intensity somewhat diffusely decreased on T1 weighted imaging, most commonly related to anemia, smoking, or obesity. Scalp soft tissues unremarkable. Globes normal soft tissues normal. Patient status post lens extraction bilaterally. Paranasal sinuses are clear. No mastoid effusion. Inner ear structures normal. MRA HEAD FINDINGS ANTERIOR CIRCULATION: Distal cervical segments of the internal carotid arteries are patent with antegrade flow. Petrous, cavernous, and supraclinoid segments patent without flow-limiting stenosis. Mild atheromatous irregularity noted within the carotid siphons bilaterally. ICA termini widely patent. A1 segments patent bilaterally. Anterior cerebral arteries patent to their distal aspects. There is a short-segment severe stenosis involving the mid left A2 segment (series 103, image 9). M1 segments demonstrate mild atheromatous irregularity but are widely patent without flow-limiting stenosis or occlusion. MCA bifurcations normal. No proximal M2 occlusion. Distal MCA branches well opacified and symmetric. POSTERIOR CIRCULATION: Visualized V4 segments widely patent to the vertebrobasilar junction. Right PICA patent proximally. Left PICA not visualized. Basilar artery widely patent to its distal aspect. Superior cerebral arteries patent bilaterally. Left PCA supplied via the basilar and is widely patent to its distal aspect. Short-segment mild left P2 stenosis noted (series 107, image 8). Mildly hypoplastic right P1 segment with prominent right posterior communicating artery. Right PCA also widely patent to its distal aspect. No significant stenosis within the vertebrobasilar system to suggest vertebrobasilar insufficiency. No aneurysm or vascular abnormality. MRA NECK FINDINGS Study mildly limited due to lack of IV contrast. Aortic arch and proximal great vessels not well evaluated on this exam. Visualize right common carotid artery widely patent  to the carotid bifurcation. Short-segment moderate stenosis of approximately 50% at the distal right common carotid artery and proximal right internal carotid artery (series 21, image 59, 55) right ICA patent distally to the skullbase without additional stenosis. Visualized left common carotid artery mildly tortuous proximally, but is widely patent to the vertebrobasilar junction. Mild atheromatous irregularity about the left carotid bifurcation without flow-limiting stenosis. Left ICA widely patent distally to the skullbase. Origin of the vertebral arteries not  evaluated on this exam. Possible short-segment moderate to severe right V1 stenosis (series 109, image 14). Additional more mild to moderate narrowing slightly distally within the proximal right V2 region (series 109, image 16). Right vertebral artery otherwise patent with antegrade flow without additional significant stenosis. Left vertebral artery slightly dominant. Possible single short-segment mild to moderate stenosis at the proximal left V2 segment (series 108, image 6). Additional short-segment mild stenosis within the mid left P2 segment (series 108, image 119). Short-segment mild narrowing at the proximal left V3 segment (series 21, image 27). Left vertebral artery otherwise patent to the skullbase. IMPRESSION: MRI HEAD IMPRESSION: 1. No acute intracranial process. 2. 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 3. Mild chronic microvascular ischemic disease for age. MRA HEAD IMPRESSION: 1. Negative intracranial MRA for large vessel occlusion. Intracranial vertebrobasilar system is widely patent. 2. Short-segment severe mid left A2 stenosis, with additional short-segment mild left P2 stenosis. 3. Mild atheromatous irregularity within the carotid siphons without flow-limiting stenosis. MRA NECK IMPRESSION: 1. Limited study due to lack of IV contrast. 2. Patent vertebral arteries  within the neck. Question few scattered predominantly proximal stenoses as above, not entirely certain given at the proximal great vessels are not well evaluated on this exam. Confirmation of these findings could be performed with a follow-up CTA as desired. 3. Short-segment moderate stenosis of approximately 50% about the right carotid bifurcation, involving the distal right common and proximal right internal carotid artery's. 4. No significant flow-limiting stenosis within the left carotid artery system. Electronically Signed   By: Jeannine Boga M.D.   On: 05/15/2017 05:57    ____________________________________________   PROCEDURES  Procedure(s) performed: None  Procedures  Critical Care performed: No  ____________________________________________   INITIAL IMPRESSION / ASSESSMENT AND PLAN / ED COURSE  Pertinent labs & imaging results that were available during my care of the patient were reviewed by me and considered in my medical decision making (see chart for details).  This is a 76 year old female who comes into the hospital today with elevated blood pressure. She reports that she also had some dizziness with some mild headache. I will send the patient for CT of her head looking for possible posterior circulation abnormalities. I will give the patient some medication for her blood pressure and the patient will be reassessed. The patient will also receive a repeat troponin.     The patient's repeat troponin is unremarkable. We attempted to do a CT Angio but was unable to obtain an IV. We instead did an MRI looking at the patient's posterior circulation. The patient's MRI was unremarkable. She had some chronic changes but no acute abnormalities. The patient's blood pressure was improved significantly. We'll discharge the patient home to have her follow-up with her primary care physician for further evaluation. ____________________________________________   FINAL CLINICAL  IMPRESSION(S) / ED DIAGNOSES  Final diagnoses:  Dizziness  Hypertension, unspecified type      NEW MEDICATIONS STARTED DURING THIS VISIT:  Discharge Medication List as of 05/15/2017  6:16 AM       Note:  This document was prepared using Dragon voice recognition software and may include unintentional dictation errors.    Loney Hering, MD 05/15/17 [phone]

## 2017-05-14 NOTE — ED Triage Notes (Signed)
Pt to triage via [place], report hypertension, noted to be 200/140, pt reports feeling weak.

## 2017-05-14 NOTE — ED Notes (Addendum)
Pt reports elevated BP "all day long" starting when she woke up Thursday morning. Pt reports non-compliance with BP medications as directed today.

## 2017-05-15 ENCOUNTER — Emergency Department: Payer: Medicare PPO

## 2017-05-15 LAB — TROPONIN I: Troponin I: <0.03 ng/mL (ref <0.03)

## 2017-05-15 NOTE — ED Notes (Signed)
Pt given 2nd can of Ginger Ale for PO hydration.

## 2017-05-15 NOTE — ED Notes (Signed)
Pt given Ginger Ale to drink for oral hydration per Dr Dahlia Client.

## 2017-05-15 NOTE — ED Notes (Addendum)
Reviewed d/c instructions, follow-up care with patient. Pt verbalized understanding.

## 2017-05-15 NOTE — ED Notes (Signed)
Apolonio Schneiders RN at bedside to attempt IV insertion

## 2017-05-15 NOTE — ED Notes (Signed)
2 unsuccessful PIV attempts by this RN (left AC and left forearm). Seth Bake, charge RN in to make PIV attempt at this time.

## 2017-05-15 NOTE — Discharge Instructions (Signed)
Please follow-up with your primary care physician. Your blood pressure did improve after some labetalol. Your MRI did not show any vertebrobasilar insufficiency. Please follow-up C he can have your blood pressure further evaluated and treated.

## 2017-08-12 ENCOUNTER — Other Ambulatory Visit: Payer: Self-pay | Admitting: Internal Medicine

## 2017-08-12 DIAGNOSIS — N6001 Solitary cyst of right breast: Secondary | ICD-10-CM

## 2017-08-12 DIAGNOSIS — N6002 Solitary cyst of left breast: Secondary | ICD-10-CM

## 2017-08-12 DIAGNOSIS — R928 Other abnormal and inconclusive findings on diagnostic imaging of breast: Secondary | ICD-10-CM

## 2017-08-13 ENCOUNTER — Other Ambulatory Visit: Payer: Self-pay | Admitting: Internal Medicine

## 2017-08-13 DIAGNOSIS — R928 Other abnormal and inconclusive findings on diagnostic imaging of breast: Secondary | ICD-10-CM

## 2017-08-13 DIAGNOSIS — N6001 Solitary cyst of right breast: Secondary | ICD-10-CM

## 2017-08-20 ENCOUNTER — Ambulatory Visit
Admission: RE | Admit: 2017-08-20 | Discharge: 2017-08-20 | Disposition: A | Discharge status: Home / Self Care | Payer: Medicare PPO | Source: Ambulatory Visit | Attending: Internal Medicine | Admitting: Internal Medicine

## 2017-08-20 DIAGNOSIS — N6001 Solitary cyst of right breast: Secondary | ICD-10-CM | POA: Insufficient documentation

## 2017-08-20 DIAGNOSIS — R928 Other abnormal and inconclusive findings on diagnostic imaging of breast: Secondary | ICD-10-CM

## 2017-12-18 ENCOUNTER — Other Ambulatory Visit: Payer: Self-pay

## 2017-12-18 ENCOUNTER — Emergency Department: Payer: Medicare PPO

## 2017-12-18 ENCOUNTER — Emergency Department
Admission: EM | Admit: 2017-12-18 | Discharge: 2017-12-18 | Disposition: A | Discharge status: Home / Self Care | Payer: Medicare PPO | Attending: Emergency Medicine | Admitting: Emergency Medicine

## 2017-12-18 ENCOUNTER — Encounter: Payer: Self-pay | Admitting: Emergency Medicine

## 2017-12-18 DIAGNOSIS — Z79899 Other long term (current) drug therapy: Secondary | ICD-10-CM | POA: Insufficient documentation

## 2017-12-18 DIAGNOSIS — N189 Chronic kidney disease, unspecified: Secondary | ICD-10-CM | POA: Diagnosis not present

## 2017-12-18 DIAGNOSIS — K2900 Acute gastritis without bleeding: Secondary | ICD-10-CM

## 2017-12-18 DIAGNOSIS — E039 Hypothyroidism, unspecified: Secondary | ICD-10-CM | POA: Insufficient documentation

## 2017-12-18 DIAGNOSIS — I129 Hypertensive chronic kidney disease with stage 1 through stage 4 chronic kidney disease, or unspecified chronic kidney disease: Secondary | ICD-10-CM | POA: Diagnosis not present

## 2017-12-18 DIAGNOSIS — R531 Weakness: Secondary | ICD-10-CM | POA: Diagnosis present

## 2017-12-18 LAB — COMPREHENSIVE METABOLIC PANEL
ALT: 12 U/L — ABNORMAL LOW (ref 14–54)
AST: 30 U/L (ref 15–41)
Albumin: 3.8 g/dL (ref 3.5–5.0)
Alkaline Phosphatase: 45 U/L (ref 38–126)
Anion gap: 10 (ref 5–15)
BUN: 20 mg/dL (ref 6–20)
CO2: 27 mmol/L (ref 22–32)
Calcium: 9.4 mg/dL (ref 8.9–10.3)
Chloride: 102 mmol/L (ref 101–111)
Creatinine, Ser: 0.96 mg/dL (ref 0.44–1.00)
GFR calc Af Amer: >60 mL/min (ref >60)
GFR calc non Af Amer: 56 mL/min — ABNORMAL LOW (ref >60)
Glucose, Bld: 94 mg/dL (ref 65–99)
Potassium: 3.9 mmol/L (ref 3.5–5.1)
Sodium: 139 mmol/L (ref 135–145)
Total Bilirubin: 0.8 mg/dL (ref 0.3–1.2)
Total Protein: 7.3 g/dL (ref 6.5–8.1)

## 2017-12-18 LAB — CBC
HCT: 33.4 % — ABNORMAL LOW (ref 35.0–47.0)
Hemoglobin: 11 g/dL — ABNORMAL LOW (ref 12.0–16.0)
MCH: 27.6 pg (ref 26.0–34.0)
MCHC: 33 g/dL (ref 32.0–36.0)
MCV: 83.8 fL (ref 80.0–100.0)
Platelets: 247 10*3/uL (ref 150–440)
RBC: 3.98 MIL/uL (ref 3.80–5.20)
RDW: 17.1 % — ABNORMAL HIGH (ref 11.5–14.5)
WBC: 7.1 10*3/uL (ref 3.6–11.0)

## 2017-12-18 LAB — LIPASE, BLOOD: Lipase: 32 U/L (ref 11–51)

## 2017-12-18 MED ORDER — ONDANSETRON 4 MG PO TBDP: 4.0000 mg | ORAL_TABLET | Freq: Three times a day (TID) | ORAL | 0 refills | Status: DC | PRN

## 2017-12-18 MED ORDER — ONDANSETRON HCL 4 MG/2ML IJ SOLN
4.0000 mg | Freq: Once | INTRAMUSCULAR | Status: AC
  Administered 2017-12-18: 4 mg via INTRAVENOUS

## 2017-12-18 MED ORDER — ONDANSETRON HCL 4 MG/2ML IJ SOLN
INTRAMUSCULAR | Status: AC
  Administered 2017-12-18: 4 mg via INTRAVENOUS
  Filled 2017-12-18: qty 2

## 2017-12-18 MED ORDER — SODIUM CHLORIDE 0.9 % IV SOLN
1000.0000 mL | Freq: Once | INTRAVENOUS | Status: AC
  Administered 2017-12-18: 1000 mL via INTRAVENOUS

## 2017-12-18 NOTE — ED Notes (Signed)
Patient oxygen dropped to 88% and then 75% while sleeping on RA with a good wave form.  Oxygen quickly went up to 94% while talking and taking deep breaths.  Patient placed on 2L [place] and MD made aware.

## 2017-12-18 NOTE — ED Notes (Signed)
Attempted to call several contacts for patient for discharge without answer.  Patient agreed to be discharged to lobby and keep trying at this time.

## 2017-12-18 NOTE — ED Notes (Signed)
Patient states pastor has her car keys and house key and would not be able to get in without them.

## 2017-12-18 NOTE — ED Triage Notes (Signed)
Pt to ED via EMS from home c/o weakness for a couple weeks and vomiting that started around 1700 yesterday.  Denies falls but is having some left back and left shoulder pain.  Denies diarrhea or fevers.

## 2017-12-18 NOTE — ED Notes (Signed)
Spoke with church member who states will call [facility] to come get patient for discharge.

## 2017-12-18 NOTE — ED Notes (Signed)
Oxygen level 98% RA and off of oxygen for the last 20 minutes.

## 2017-12-18 NOTE — ED Provider Notes (Signed)
[facility] Emergency Department Provider Note   ____________________________________________    I have reviewed the triage vital signs and the nursing notes.   HISTORY  Chief Complaint Weakness and Emesis     HPI Erin Sutton is a 77 y.o. female who presents with complaints of diffuse weakness, nausea and vomiting.  Patient reports she was feeling overall well until approximately 5 PM yesterday when she developed nausea and vomiting.  Multiple episodes of nonbilious nonbloody vomitus.  She went home and got in her bed and has not had dinner or breakfast and feels overall weak.  Denies fevers possibly some chills.  No sick contacts reported.  One episode of loose stools this morning.  No abdominal pain.  No headache.  No neuro deficits.   Past Medical History:  Diagnosis Date  . Anxiety   . Arthritis   . Chronic kidney disease   . Constipation   . GERD (gastroesophageal reflux disease)   . Hypertension   . Hypothyroidism   . Sleep apnea     Patient Active Problem List   Diagnosis Date Noted  . Sepsis ([place]) 12/17/2016    Past Surgical History:  Procedure Laterality Date  . BREAST BIOPSY Right    neg/stereo  . BREAST BIOPSY Left    neg/stereo  . BREAST EXCISIONAL BIOPSY Right    neg  . COLONOSCOPY WITH PROPOFOL N/A 07/02/2015   Procedure: COLONOSCOPY WITH PROPOFOL;  Surgeon: Manya Silvas, MD;  Location: [facility] ENDOSCOPY;  Service: Endoscopy;  Laterality: N/A;  . CORRECTION HAMMER TOE Right   . ESOPHAGOGASTRODUODENOSCOPY (EGD) WITH PROPOFOL N/A 07/02/2015   Procedure: ESOPHAGOGASTRODUODENOSCOPY (EGD) WITH PROPOFOL;  Surgeon: Manya Silvas, MD;  Location: [facility] ENDOSCOPY;  Service: Endoscopy;  Laterality: N/A;  . JOINT REPLACEMENT Left    knee  . SKIN BIOPSY Right     Prior to Admission medications   Medication Sig Start Date End Date Taking? Authorizing Provider  amLODipine (NORVASC) 10 MG tablet Take 10 mg by mouth daily.     [provider]  aspirin EC 81 MG tablet Take 81 mg by mouth daily.    [provider]  isosorbide mononitrate (IMDUR) 30 MG 24 hr tablet Take 30 mg by mouth daily.    [provider]  levothyroxine (SYNTHROID, LEVOTHROID) 50 MCG tablet Take 50 mcg by mouth daily before breakfast.    [provider]  LORazepam (ATIVAN) 2 MG tablet Take 2 mg by mouth every 8 (eight) hours as needed for anxiety.     [provider]  magnesium oxide (MAG-OX) 400 MG tablet Take 400 mg by mouth daily.    [provider]  Multiple Vitamin-Folic Acid TABS Take by mouth every morning.    [provider]  Multiple Vitamins-Minerals (CENTRUM SILVER 50+WOMEN) TABS Take 1 tablet by mouth daily.    [provider]  ondansetron (ZOFRAN) 4 MG tablet Take 1 tablet (4 mg total) by mouth daily as needed for nausea or vomiting. 11/08/15   Earleen Newport, MD  pantoprazole (PROTONIX) 40 MG tablet Take 40 mg by mouth daily.    [provider]  potassium chloride SA (K-DUR,KLOR-CON) 20 MEQ tablet Take 20 mEq by mouth every morning.    [provider]  pravastatin (PRAVACHOL) 40 MG tablet Take 40 mg by mouth at bedtime.     [provider]  sertraline (ZOLOFT) 100 MG tablet Take 100 mg by mouth at bedtime.     [provider]     Allergies Ace inhibitors; Benicar [olmesartan]; Amoxicillin; Codeine sulfate; Levaquin [levofloxacin in d5w]; Penicillin v potassium; and Sulfa antibiotics  Family History  Problem Relation Age of Onset  . Hypertension Mother   . Diabetes Mother   . Heart attack Mother   . Asthma Mother   . Peripheral vascular disease Mother   . Heart attack Father   . Hypertension Father   . Diabetes Father     Social History Social History   Tobacco Use  . Smoking status: Never Smoker  . Smokeless tobacco: Never Used  Substance Use Topics  . Alcohol use: No  . Drug use: No    Review of  Systems  Constitutional: No fever/chills Eyes: No visual changes.  ENT: No neck pain Cardiovascular: Denies chest pain. Respiratory: Denies shortness of breath. Gastrointestinal: No abdominal pain, vomiting as above Genitourinary: Negative for dysuria. Musculoskeletal: Chronic left knee pain Skin: Negative for rash. Neurological: No headache or focal weakness   ____________________________________________   PHYSICAL EXAM:  VITAL SIGNS: ED Triage Vitals  Enc Vitals Group     BP      Pulse      Resp      Temp      Temp src      SpO2      Weight      Height      Head Circumference      Peak Flow      Pain Score      Pain Loc      Pain Edu?      Excl. in [place]?     Constitutional: Alert and oriented. No acute distress. Pleasant and interactive Eyes: Conjunctivae are normal.  Nose: No congestion/rhinnorhea. Mouth/Throat: Mucous membranes are moist.   Neck:  Painless ROM Cardiovascular: Normal rate, regular rhythm.   Good peripheral circulation. Respiratory: Normal respiratory effort.  No retractions. Gastrointestinal: Soft and nontender. No distention.   Genitourinary: deferred Musculoskeletal: No lower extremity tenderness nor edema.  Warm and well perfused Neurologic:  Normal speech and language. No gross focal neurologic deficits are appreciated.  Skin:  Skin is warm, dry and intact. No rash noted. Psychiatric: Mood and affect are normal. Speech and behavior are normal.  ____________________________________________   LABS (all labs ordered are listed, but only abnormal results are displayed)  Labs Reviewed  CBC - Abnormal; Notable for the following components:      Result Value   Hemoglobin 11.0 (*)    HCT 33.4 (*)    RDW 17.1 (*)    All other components within normal limits  COMPREHENSIVE METABOLIC PANEL - Abnormal; Notable for the following components:   ALT 12 (*)    GFR calc non Af Amer 56 (*)    All other components within normal limits  LIPASE,  BLOOD  URINALYSIS, COMPLETE (UACMP) WITH MICROSCOPIC   ____________________________________________  EKG  None ____________________________________________  RADIOLOGY  None ____________________________________________   PROCEDURES  Procedure(s) performed: No  Procedures   Critical Care performed: No ____________________________________________   INITIAL IMPRESSION / ASSESSMENT AND PLAN / ED COURSE  Pertinent labs & imaging results that were available during my care of the patient were reviewed by me and considered in my medical decision making (see chart for details).  Patient presents with nausea vomiting however no abdominal pain, one loose stool.  Overall well-appearing in no acute distress.  Exam is reassuring however I suspect mild dehydration.  Will give IV fluids, check labs, give IV Zofran  and reevaluate.  Differential diagnosis: Viral gastritis, gastritis NOS, gastroenteritis, influenza although unlikely given lack of myalgias, fever, or other symptomatology.  ----------------------------------------- 1:27 PM on 12/18/2017 -----------------------------------------  Patient resting comfortably in bed, she reports she feels much better.  No further nausea or vomiting.  Lab work is overall reassuring.  Chest x-ray is normal, performed because of a cough that she has been having.  Feels she is appropriate for discharge with close outpatient follow-up.  Strict return precautions discussed.  Rx p.o. Zofran    ____________________________________________   FINAL CLINICAL IMPRESSION(S) / ED DIAGNOSES  Final diagnoses:  Acute gastritis without hemorrhage, unspecified gastritis type        Note:  This document was prepared using Dragon voice recognition software and may include unintentional dictation errors.    Lavonia Drafts, MD 12/18/17 1329

## 2018-02-12 ENCOUNTER — Emergency Department
Admission: EM | Admit: 2018-02-12 | Discharge: 2018-02-13 | Disposition: A | Discharge status: Home / Self Care | Payer: Medicare PPO | Attending: Emergency Medicine | Admitting: Emergency Medicine

## 2018-02-12 ENCOUNTER — Emergency Department: Payer: Medicare PPO

## 2018-02-12 ENCOUNTER — Other Ambulatory Visit: Payer: Self-pay

## 2018-02-12 DIAGNOSIS — Y9389 Activity, other specified: Secondary | ICD-10-CM | POA: Insufficient documentation

## 2018-02-12 DIAGNOSIS — I129 Hypertensive chronic kidney disease with stage 1 through stage 4 chronic kidney disease, or unspecified chronic kidney disease: Secondary | ICD-10-CM | POA: Insufficient documentation

## 2018-02-12 DIAGNOSIS — S4991XA Unspecified injury of right shoulder and upper arm, initial encounter: Secondary | ICD-10-CM | POA: Diagnosis present

## 2018-02-12 DIAGNOSIS — E039 Hypothyroidism, unspecified: Secondary | ICD-10-CM | POA: Insufficient documentation

## 2018-02-12 DIAGNOSIS — Z79899 Other long term (current) drug therapy: Secondary | ICD-10-CM | POA: Insufficient documentation

## 2018-02-12 DIAGNOSIS — Z7982 Long term (current) use of aspirin: Secondary | ICD-10-CM | POA: Insufficient documentation

## 2018-02-12 DIAGNOSIS — W010XXA Fall on same level from slipping, tripping and stumbling without subsequent striking against object, initial encounter: Secondary | ICD-10-CM | POA: Insufficient documentation

## 2018-02-12 DIAGNOSIS — Y998 Other external cause status: Secondary | ICD-10-CM | POA: Diagnosis not present

## 2018-02-12 DIAGNOSIS — S42211A Unspecified displaced fracture of surgical neck of right humerus, initial encounter for closed fracture: Secondary | ICD-10-CM

## 2018-02-12 DIAGNOSIS — N189 Chronic kidney disease, unspecified: Secondary | ICD-10-CM | POA: Diagnosis not present

## 2018-02-12 DIAGNOSIS — Y9222 Religious institution as the place of occurrence of the external cause: Secondary | ICD-10-CM | POA: Diagnosis not present

## 2018-02-12 DIAGNOSIS — W19XXXA Unspecified fall, initial encounter: Secondary | ICD-10-CM

## 2018-02-12 HISTORY — DX: Age-related osteoporosis without current pathological fracture: M81.0

## 2018-02-12 HISTORY — DX: Unspecified urinary incontinence: R32

## 2018-02-12 MED ORDER — OXYCODONE HCL 5 MG PO TABS: 5.0000 mg | ORAL_TABLET | Freq: Three times a day (TID) | ORAL | 0 refills | Status: DC | PRN

## 2018-02-12 MED ORDER — ACETAMINOPHEN 500 MG PO TABS
1000.0000 mg | ORAL_TABLET | Freq: Once | ORAL | Status: AC
  Administered 2018-02-12: 1000 mg via ORAL
  Filled 2018-02-12: qty 2

## 2018-02-12 MED ORDER — OXYCODONE HCL 5 MG PO TABS
5.0000 mg | ORAL_TABLET | Freq: Once | ORAL | Status: AC
  Administered 2018-02-12: 5 mg via ORAL
  Filled 2018-02-12: qty 1

## 2018-02-12 NOTE — ED Notes (Signed)
Pt states she remembers eating at church and walking around after eating. States she believes she tripped over her feet, landing on floor. States tile like floor. Unsure if hit head. No c/o of headache. C/o pain from R elbow up to R arm. Noticed swelling to R upper arm and shoulder. Strong pulses present, cap refill less than 3 seconds, skin warm and dry. Pt is alert and oriented at this time. Denies any other injury. Denies any other falls recently. Denies dizziness or light headed. States sometimes uses a cane when she is feeling unsteady. Denies using cane while at church.

## 2018-02-12 NOTE — ED Triage Notes (Signed)
Pt arrives to ED via [facility] from [facility]. Pt fell, injurying R arm. Arrives in splint. EMS gave 137mcg fentanyl about 20 minutes PTA. Good pulses and color in R arm per EMS. Pt was hypertensive in 190's and 170's but states that's normal for her. Arrives 22 L hand.

## 2018-02-12 NOTE — Discharge Instructions (Addendum)
Pain control: Take tylenol 1000mg  every 8 hours. Take 5mg  of oxycodone every 6 hours for breakthrough pain. If you need the oxycodone make sure to take one senokot as well to prevent constipation.  Do not drink alcohol, drive or participate in any other potentially dangerous activities while taking this medication as it may make you sleepy. Do not take this medication with any other sedating medications, either prescription or over-the-counter.   Return to the emergency room if you have severe pain, if your arm feels hard, if you notice blue or pale discoloration of your arm or if you have pins and needles sensation on your arm. Otherwise call orthopedics on Monday.

## 2018-02-12 NOTE — ED Notes (Signed)
Pt returned from scans. Given warm blankets.

## 2018-02-12 NOTE — ED Notes (Signed)
Pt returned from CT via stretcher.

## 2018-02-12 NOTE — ED Notes (Signed)
Pt in scans, visitors at bedside.

## 2018-02-12 NOTE — ED Provider Notes (Signed)
[facility] Emergency Department Provider Note  ____________________________________________  Time seen: Approximately 11:14 PM  I have reviewed the triage vital signs and the nursing notes.   HISTORY  Chief Complaint Fall and Arm Injury   HPI Erin Sutton is a 77 y.o. female with a history of osteoporosis, hypertension, chronic kidney disease who presents for evaluation of right arm pain status post mechanical fall.  Patient reports that she was having dinner at church.  She tried to get up from the table, her foot got caught and she fell onto the right shoulder.  She is complaining of severe pain in her entire right arm that has been constant, nonradiating since the fall which happened just prior to arrival, worse with palpation or movement of the arm.  She reports hitting her head on a couple of folding chairs.  No LOC.  She is not on blood thinners.  She denies headache, neck pain, back pain, chest pain, abdominal pain, hip pain.  Patient denies any preceding symptoms leading to the fall such as dizziness and reports that the fall was mechanical in nature.  Patient received 100 mcg of fentanyl per EMS in route and is very slurring upon arrival.    Past Medical History:  Diagnosis Date  . Anxiety   . Arthritis   . Chronic kidney disease   . Constipation   . GERD (gastroesophageal reflux disease)   . Hypertension   . Hypothyroidism   . Osteoporosis   . Sleep apnea   . Urinary incontinence     Patient Active Problem List   Diagnosis Date Noted  . Sepsis ([place]) 12/17/2016    Past Surgical History:  Procedure Laterality Date  . BREAST BIOPSY Right    neg/stereo  . BREAST BIOPSY Left    neg/stereo  . BREAST EXCISIONAL BIOPSY Right    neg  . COLONOSCOPY WITH PROPOFOL N/A 07/02/2015   Procedure: COLONOSCOPY WITH PROPOFOL;  Surgeon: Manya Silvas, MD;  Location: [facility] ENDOSCOPY;  Service: Endoscopy;  Laterality: N/A;  . CORRECTION HAMMER TOE  Right   . ESOPHAGOGASTRODUODENOSCOPY (EGD) WITH PROPOFOL N/A 07/02/2015   Procedure: ESOPHAGOGASTRODUODENOSCOPY (EGD) WITH PROPOFOL;  Surgeon: Manya Silvas, MD;  Location: [facility] ENDOSCOPY;  Service: Endoscopy;  Laterality: N/A;  . JOINT REPLACEMENT Left    knee  . REPLACEMENT TOTAL KNEE Left   . SKIN BIOPSY Right     Prior to Admission medications   Medication Sig Start Date End Date Taking? Authorizing Provider  amLODipine (NORVASC) 10 MG tablet Take 10 mg by mouth daily.    [provider]  aspirin EC 81 MG tablet Take 81 mg by mouth daily.    [provider]  Cholecalciferol (VITAMIN D PO) Take 1 tablet by mouth daily.    [provider]  isosorbide mononitrate (IMDUR) 30 MG 24 hr tablet Take 30 mg by mouth daily.    [provider]  levothyroxine (SYNTHROID, LEVOTHROID) 50 MCG tablet Take 50 mcg by mouth daily before breakfast.    [provider]  LORazepam (ATIVAN) 2 MG tablet Take 2 mg by mouth every 8 (eight) hours as needed for anxiety.     [provider]  magnesium oxide (MAG-OX) 400 MG tablet Take 400 mg by mouth daily.    [provider]  ondansetron (ZOFRAN ODT) 4 MG disintegrating tablet Take 1 tablet (4 mg total) by mouth every 8 (eight) hours as needed for nausea or vomiting. 12/18/17   Lavonia Drafts,  MD  ondansetron (ZOFRAN) 4 MG tablet Take 1 tablet (4 mg total) by mouth daily as needed for nausea or vomiting. Patient not taking: Reported on 12/18/2017 11/08/15   Earleen Newport, MD  oxyCODONE (ROXICODONE) 5 MG immediate release tablet Take 1 tablet (5 mg total) by mouth every 8 (eight) hours as needed. 02/12/18 02/12/19  Rudene Re, MD  pantoprazole (PROTONIX) 40 MG tablet Take 40 mg by mouth daily.    [provider]  Polyethyl Glycol-Propyl Glycol (SYSTANE OP) Place 1 drop into both eyes 2 (two) times daily.    [provider]  potassium chloride SA (K-DUR,KLOR-CON) 20 MEQ tablet  Take 20 mEq by mouth every morning.    [provider]  pravastatin (PRAVACHOL) 40 MG tablet Take 40 mg by mouth at bedtime.     [provider]  sertraline (ZOLOFT) 50 MG tablet Take 50 mg by mouth at bedtime.     [provider]    Allergies Ace inhibitors; Amoxicillin; Benicar [olmesartan]; Codeine sulfate; Levaquin [levofloxacin in d5w]; Penicillin v potassium; and Sulfa antibiotics  Family History  Problem Relation Age of Onset  . Hypertension Mother   . Diabetes Mother   . Heart attack Mother   . Asthma Mother   . Peripheral vascular disease Mother   . Heart attack Father   . Hypertension Father   . Diabetes Father     Social History Social History   Tobacco Use  . Smoking status: Never Smoker  . Smokeless tobacco: Never Used  Substance Use Topics  . Alcohol use: No  . Drug use: No    Review of Systems Constitutional: Negative for fever. Eyes: Negative for visual changes. ENT: Negative for facial injury or neck injury Cardiovascular: Negative for chest injury. Respiratory: Negative for shortness of breath. Negative for chest wall injury. Gastrointestinal: Negative for abdominal pain or injury. Genitourinary: Negative for dysuria. Musculoskeletal: Negative for back injury, + R arm pain Skin: Negative for laceration/abrasions. Neurological: + head injury.   ____________________________________________   PHYSICAL EXAM:  VITAL SIGNS: ED Triage Vitals  Enc Vitals Group     BP 02/12/18 2001 (!) 154/72     Pulse Rate 02/12/18 2001 78     Resp 02/12/18 2001 12     Temp 02/12/18 2001 (!) 97.4 F (36.3 C)     Temp Source 02/12/18 2001 Oral     SpO2 02/12/18 2001 97 %     Weight 02/12/18 1952 177 lb 1.6 oz (80.3 kg)     Height 02/12/18 1952 5' (1.524 m)     Head Circumference --      Peak Flow --      Pain Score 02/12/18 1952 9     Pain Loc --      Pain Edu? --      Excl. in [place]? --    Full spinal precautions maintained  throughout the trauma exam. Constitutional: Alert and oriented. No acute distress. Does not appear intoxicated. HEENT Head: Normocephalic and atraumatic. Face: No facial bony tenderness. Stable midface Ears: No hemotympanum bilaterally. No Battle sign Eyes: No eye injury. PERRL. No raccoon eyes Nose: Nontender. No epistaxis. No rhinorrhea Mouth/Throat: Mucous membranes are moist. No oropharyngeal blood. No dental injury. Airway patent without stridor. Normal voice. Neck: no C-collar in place. No midline c-spine tenderness.  Cardiovascular: Normal rate, regular rhythm. Normal and symmetric distal pulses are present in all extremities. Pulmonary/Chest: Chest wall is stable and nontender to palpation/compression. Normal respiratory effort. Breath sounds  are normal. No crepitus.  Abdominal: Soft, nontender, non distended. Musculoskeletal: There is an obvious deformity at the proximal humerus on the right, soft compartments, intact strong distal pulses.  Patient complaining of tenderness to palpation of the entire right upper extremity with no other obvious deformities.  Nontender with normal full range of motion in all other extremities.  No thoracic or lumbar midline spinal tenderness. Pelvis is stable. Skin: Skin is warm, dry and intact. No abrasions or contutions. Psychiatric: Speech and behavior are appropriate. Neurological: Normal speech and language. Moves all extremities to command. No gross focal neurologic deficits are appreciated.  Glascow Coma Score: 4 - Opens eyes on own 6 - Follows simple motor commands 5 - Alert and oriented GCS: 15  ____________________________________________   LABS (all labs ordered are listed, but only abnormal results are displayed)  Labs Reviewed - No data to display ____________________________________________  EKG  ED ECG REPORT I, Rudene Re, the attending physician, personally viewed and interpreted this ECG.  Normal sinus rhythm, rate  of 75, right bundle branch block, normal QTC, normal axis, T wave inversions in V2, no ST elevations or depressions.  Unchanged from baseline. ____________________________________________  RADIOLOGY  I have personally reviewed the images performed during this visit and I agree with the Radiologist's read.   Interpretation by Radiologist:  Dg Shoulder Right  Result Date: 02/12/2018 CLINICAL DATA:  77 year old fell at church, injuring the right arm. Pain. EXAM: RIGHT SHOULDER - 2+ VIEW COMPARISON:  None. FINDINGS: The bones are mildly demineralized. There is an acute mildly displaced fracture of the right humeral neck. No involvement of the articular surface is demonstrated. There is no dislocation. The visualized right scapula and right clavicle appear intact. IMPRESSION: Mildly displaced and mildly comminuted fracture of the right humeral neck. Electronically Signed   By: Richardean Sale M.D.   On: 02/12/2018 20:56   Dg Elbow 2 Views Right  Result Date: 02/12/2018 CLINICAL DATA:  77 year old fell at church, injuring the right arm. Pain. EXAM: RIGHT ELBOW - 2 VIEW COMPARISON:  None. FINDINGS: The bones are mildly demineralized. There are mild degenerative changes of the elbow. No evidence of acute fracture or elbow joint effusion. IMPRESSION: No acute osseous findings at the elbow. Electronically Signed   By: Richardean Sale M.D.   On: 02/12/2018 20:57   Dg Forearm Right  Result Date: 02/12/2018 CLINICAL DATA:  77 year old fell at church, injuring the right arm. Pain. EXAM: RIGHT FOREARM - 2 VIEW COMPARISON:  None. FINDINGS: The bones are mildly demineralized. No evidence of acute fracture or dislocation. IMPRESSION: No acute osseous findings identified within the right forearm. Electronically Signed   By: Richardean Sale M.D.   On: 02/12/2018 20:58   Dg Wrist Complete Right  Result Date: 02/12/2018 CLINICAL DATA:  77 year old fell at church, injuring the right arm. Pain. EXAM: RIGHT WRIST -  COMPLETE 3+ VIEW COMPARISON:  None. FINDINGS: The bones are mildly demineralized. No evidence of acute fracture or dislocation. Mild degenerative changes are present at the wrist, greatest radially, and at the [facility]st [facility] articulation. IMPRESSION: No acute osseous findings at the right wrist. Electronically Signed   By: Richardean Sale M.D.   On: 02/12/2018 20:59   Ct Head Wo Contrast  Result Date: 02/12/2018 CLINICAL DATA:  Fall EXAM: CT HEAD WITHOUT CONTRAST CT CERVICAL SPINE WITHOUT CONTRAST TECHNIQUE: Multidetector CT imaging of the head and cervical spine was performed following the standard protocol without intravenous contrast. Multiplanar CT image reconstructions of the cervical spine  were also generated. COMPARISON:  MRI brain 05/15/2017, CT 08/05/2010 FINDINGS: CT HEAD FINDINGS Brain: No acute territorial infarction, hemorrhage or intracranial mass is visualized. The ventricles are nonenlarged. Minimal small vessel ischemic changes of the white matter Vascular: No hyperdense vessels.  Carotid vascular calcification Skull: Normal. Negative for fracture or focal lesion. Sinuses/Orbits: No acute finding. Other: None CT CERVICAL SPINE FINDINGS Alignment: No subluxation.  Facet alignment within normal limits Skull base and vertebrae: No acute fracture. No primary bone lesion or focal pathologic process. Soft tissues and spinal canal: No prevertebral fluid or swelling. No visible canal hematoma. Disc levels: Moderate degenerative changes at C5-C6 and mild to moderate degenerative changes at C6-C7. Bilateral foraminal stenosis at C5-C6. Upper chest: Lung apices are clear. No thyroid mass. Carotid vascular calcification. Other: None IMPRESSION: 1. No CT evidence for acute intracranial abnormality. Minimal small vessel ischemic changes of the white matter 2. Degenerative changes of the cervical spine. No acute osseous abnormality. Electronically Signed   By: Donavan Foil M.D.   On: 02/12/2018 21:33   Ct Cervical  Spine Wo Contrast  Result Date: 02/12/2018 CLINICAL DATA:  Fall EXAM: CT HEAD WITHOUT CONTRAST CT CERVICAL SPINE WITHOUT CONTRAST TECHNIQUE: Multidetector CT imaging of the head and cervical spine was performed following the standard protocol without intravenous contrast. Multiplanar CT image reconstructions of the cervical spine were also generated. COMPARISON:  MRI brain 05/15/2017, CT 08/05/2010 FINDINGS: CT HEAD FINDINGS Brain: No acute territorial infarction, hemorrhage or intracranial mass is visualized. The ventricles are nonenlarged. Minimal small vessel ischemic changes of the white matter Vascular: No hyperdense vessels.  Carotid vascular calcification Skull: Normal. Negative for fracture or focal lesion. Sinuses/Orbits: No acute finding. Other: None CT CERVICAL SPINE FINDINGS Alignment: No subluxation.  Facet alignment within normal limits Skull base and vertebrae: No acute fracture. No primary bone lesion or focal pathologic process. Soft tissues and spinal canal: No prevertebral fluid or swelling. No visible canal hematoma. Disc levels: Moderate degenerative changes at C5-C6 and mild to moderate degenerative changes at C6-C7. Bilateral foraminal stenosis at C5-C6. Upper chest: Lung apices are clear. No thyroid mass. Carotid vascular calcification. Other: None IMPRESSION: 1. No CT evidence for acute intracranial abnormality. Minimal small vessel ischemic changes of the white matter 2. Degenerative changes of the cervical spine. No acute osseous abnormality. Electronically Signed   By: Donavan Foil M.D.   On: 02/12/2018 21:33   Dg Humerus Right  Result Date: 02/12/2018 CLINICAL DATA:  77 year old fell at church, injuring the right arm. Pain. EXAM: RIGHT HUMERUS - 2+ VIEW COMPARISON:  None. FINDINGS: Bones are demineralized. There is a comminuted and mildly displaced fracture the right humeral neck. No involvement of the articular surface or dislocation identified. The distal humerus appears intact.  IMPRESSION: Mildly comminuted and displaced fracture of the right humeral neck. Electronically Signed   By: Richardean Sale M.D.   On: 02/12/2018 20:55     ____________________________________________   PROCEDURES  Procedure(s) performed: yes .Splint Application Date/Time: 6/73/4193 11:21 PM Performed by: Rudene Re, MD Authorized by: Rudene Re, MD   Consent:    Consent obtained:  Verbal   Consent given by:  Patient   Risks discussed:  Discoloration, numbness and pain Pre-procedure details:    Sensation:  Normal Procedure details:    Laterality:  Right   Location:  Shoulder   Shoulder:  R shoulder   Splint type: shoulder immobilizer. Post-procedure details:    Pain:  Improved   Sensation:  Normal   Patient  tolerance of procedure:  Tolerated well, no immediate complications   Critical Care performed:  None ____________________________________________   INITIAL IMPRESSION / ASSESSMENT AND PLAN / ED COURSE  77 y.o. female with a history of osteoporosis, hypertension, chronic kidney disease who presents for evaluation of right arm pain status post mechanical fall.  Patient has no obvious trauma to the head and neck with no signs or symptoms of basilar skull fracture.  She is very slurry and according to EMS that started after she received 100 mcg of IV fentanyl.  She is otherwise neurologically intact with no signs of stroke.  She has an obvious deformity to the right proximal humerus.  Patient is complaining of pain in the entire arm upon palpation with no other obvious deformities.  However since patient seems to be sedated due to her medication x-rays of the entire right upper extremity were done.  She was found to have a displaced right proximal humeral fracture.  No evidence of compartment syndrome.  Discussed with Dr. Mack Guise who recommended immobilization and follow-up as an outpatient.  Pain is well controlled with p.o. Tylenol and oxycodone.  She was  provided a prescription for oxycodone.  Right upper extremity was placed on a shoulder immobilizer.  Discussed signs and symptoms of compartment syndrome with the patient and her daughter.  Patient is log to be discharged to the care of the daughter with follow-up with orthopedics on Monday.      As part of my medical decision making, I reviewed the following data within the [place] notes reviewed and incorporated, Radiograph reviewed , A consult was requested and obtained from this/these consultant(s) Orthopedics, Notes from prior ED visits and [place] Controlled Substance Database    Pertinent labs & imaging results that were available during my care of the patient were reviewed by me and considered in my medical decision making (see chart for details).    ____________________________________________   FINAL CLINICAL IMPRESSION(S) / ED DIAGNOSES  Final diagnoses:  Fall, initial encounter  Closed displaced fracture of surgical neck of right humerus, unspecified fracture morphology, initial encounter      NEW MEDICATIONS STARTED DURING THIS VISIT:  ED Discharge Orders        Ordered    oxyCODONE (ROXICODONE) 5 MG immediate release tablet  Every 8 hours PRN     02/12/18 2312       Note:  This document was prepared using Dragon voice recognition software and may include unintentional dictation errors.    Alfred Levins, [place], MD 02/12/18 2325

## 2018-02-13 MED ORDER — OXYCODONE-ACETAMINOPHEN 5-325 MG PO TABS
2.0000 | ORAL_TABLET | Freq: Once | ORAL | Status: AC
  Administered 2018-02-13: 2 via ORAL
  Filled 2018-02-13: qty 2

## 2018-02-15 DIAGNOSIS — S42209A Unspecified fracture of upper end of unspecified humerus, initial encounter for closed fracture: Secondary | ICD-10-CM | POA: Insufficient documentation

## 2018-06-30 ENCOUNTER — Emergency Department: Payer: Medicare PPO

## 2018-06-30 ENCOUNTER — Observation Stay
Admission: EM | Admit: 2018-06-30 | Discharge: 2018-07-02 | Disposition: A | Discharge status: Home / Self Care | Payer: Medicare PPO | Attending: Internal Medicine | Admitting: Internal Medicine

## 2018-06-30 ENCOUNTER — Other Ambulatory Visit: Payer: Self-pay

## 2018-06-30 DIAGNOSIS — K219 Gastro-esophageal reflux disease without esophagitis: Secondary | ICD-10-CM | POA: Diagnosis not present

## 2018-06-30 DIAGNOSIS — Z881 Allergy status to other antibiotic agents status: Secondary | ICD-10-CM | POA: Insufficient documentation

## 2018-06-30 DIAGNOSIS — Z79899 Other long term (current) drug therapy: Secondary | ICD-10-CM | POA: Diagnosis not present

## 2018-06-30 DIAGNOSIS — E039 Hypothyroidism, unspecified: Secondary | ICD-10-CM | POA: Diagnosis present

## 2018-06-30 DIAGNOSIS — Z9181 History of falling: Secondary | ICD-10-CM | POA: Diagnosis not present

## 2018-06-30 DIAGNOSIS — N189 Chronic kidney disease, unspecified: Secondary | ICD-10-CM | POA: Insufficient documentation

## 2018-06-30 DIAGNOSIS — I700 Atherosclerosis of aorta: Secondary | ICD-10-CM | POA: Insufficient documentation

## 2018-06-30 DIAGNOSIS — R1310 Dysphagia, unspecified: Secondary | ICD-10-CM | POA: Diagnosis not present

## 2018-06-30 DIAGNOSIS — R531 Weakness: Secondary | ICD-10-CM | POA: Diagnosis present

## 2018-06-30 DIAGNOSIS — M6281 Muscle weakness (generalized): Secondary | ICD-10-CM | POA: Diagnosis not present

## 2018-06-30 DIAGNOSIS — I10 Essential (primary) hypertension: Secondary | ICD-10-CM | POA: Diagnosis present

## 2018-06-30 DIAGNOSIS — R410 Disorientation, unspecified: Secondary | ICD-10-CM | POA: Diagnosis present

## 2018-06-30 DIAGNOSIS — M19012 Primary osteoarthritis, left shoulder: Secondary | ICD-10-CM | POA: Insufficient documentation

## 2018-06-30 DIAGNOSIS — Z8673 Personal history of transient ischemic attack (TIA), and cerebral infarction without residual deficits: Secondary | ICD-10-CM | POA: Insufficient documentation

## 2018-06-30 DIAGNOSIS — F419 Anxiety disorder, unspecified: Secondary | ICD-10-CM | POA: Diagnosis not present

## 2018-06-30 DIAGNOSIS — Z880 Allergy status to penicillin: Secondary | ICD-10-CM | POA: Diagnosis not present

## 2018-06-30 DIAGNOSIS — Z7982 Long term (current) use of aspirin: Secondary | ICD-10-CM | POA: Diagnosis not present

## 2018-06-30 DIAGNOSIS — I129 Hypertensive chronic kidney disease with stage 1 through stage 4 chronic kidney disease, or unspecified chronic kidney disease: Secondary | ICD-10-CM | POA: Diagnosis not present

## 2018-06-30 DIAGNOSIS — G4730 Sleep apnea, unspecified: Secondary | ICD-10-CM | POA: Diagnosis not present

## 2018-06-30 DIAGNOSIS — Z882 Allergy status to sulfonamides status: Secondary | ICD-10-CM | POA: Insufficient documentation

## 2018-06-30 DIAGNOSIS — G459 Transient cerebral ischemic attack, unspecified: Secondary | ICD-10-CM | POA: Diagnosis not present

## 2018-06-30 DIAGNOSIS — R2681 Unsteadiness on feet: Secondary | ICD-10-CM | POA: Insufficient documentation

## 2018-06-30 LAB — COMPREHENSIVE METABOLIC PANEL
ALT: 12 U/L (ref 0–44)
AST: 31 U/L (ref 15–41)
Albumin: 4.8 g/dL (ref 3.5–5.0)
Alkaline Phosphatase: 63 U/L (ref 38–126)
Anion gap: 11 (ref 5–15)
BUN: 14 mg/dL (ref 8–23)
CO2: 26 mmol/L (ref 22–32)
Calcium: 10.4 mg/dL — ABNORMAL HIGH (ref 8.9–10.3)
Chloride: 105 mmol/L (ref 98–111)
Creatinine, Ser: 0.81 mg/dL (ref 0.44–1.00)
GFR calc Af Amer: >60 mL/min (ref >60)
GFR calc non Af Amer: >60 mL/min (ref >60)
Glucose, Bld: 112 mg/dL — ABNORMAL HIGH (ref 70–99)
Potassium: 3.7 mmol/L (ref 3.5–5.1)
Sodium: 142 mmol/L (ref 135–145)
Total Bilirubin: 0.6 mg/dL (ref 0.3–1.2)
Total Protein: 8.6 g/dL — ABNORMAL HIGH (ref 6.5–8.1)

## 2018-06-30 LAB — URINALYSIS, COMPLETE (UACMP) WITH MICROSCOPIC
Bilirubin Urine: NEGATIVE
Glucose, UA: NEGATIVE mg/dL
Ketones, ur: NEGATIVE mg/dL
Leukocytes, UA: NEGATIVE
Nitrite: NEGATIVE
Protein, ur: NEGATIVE mg/dL
Specific Gravity, Urine: 1.004 — ABNORMAL LOW (ref 1.005–1.030)
Squamous Epithelial / LPF: NONE SEEN (ref 0–5)
pH: 8 (ref 5.0–8.0)

## 2018-06-30 LAB — CBC
HCT: 37.4 % (ref 35.0–47.0)
Hemoglobin: 12.4 g/dL (ref 12.0–16.0)
MCH: 28.2 pg (ref 26.0–34.0)
MCHC: 33.1 g/dL (ref 32.0–36.0)
MCV: 85.2 fL (ref 80.0–100.0)
Platelets: 330 10*3/uL (ref 150–440)
RBC: 4.39 MIL/uL (ref 3.80–5.20)
RDW: 16.1 % — ABNORMAL HIGH (ref 11.5–14.5)
WBC: 7.5 10*3/uL (ref 3.6–11.0)

## 2018-06-30 LAB — TROPONIN I: Troponin I: <0.03 ng/mL (ref <0.03)

## 2018-06-30 NOTE — ED Notes (Signed)
Pt ambulatory to the bathroom. With plus 1 assist.

## 2018-06-30 NOTE — ED Provider Notes (Signed)
[facility] Emergency Department Provider Note  Time seen: 6:22 PM  I have reviewed the triage vital signs and the nursing notes.   HISTORY  Chief Complaint Weakness    HPI Erin Sutton is a 77 y.o. female with a past medical history of anxiety, CKD, gastric reflux, hypertension, hypothyroidism, presents to the emergency department for altered mental status.  According to the patient since this morning she has been very confused, friend states she saw the patient around 3 PM the patient did not recall her name, was very confused.  Patient states earlier today she was experiencing pressure in her chest and shortness of breath but those have since resolved.  Denies any abdominal pain nausea vomiting diarrhea or dysuria.  Patient denies any pain currently.  Continues to be confused per friend.  Occasionally calls her friend by the wrong name but she states it is extremely abnormal for the patient.   Past Medical History:  Diagnosis Date  . Anxiety   . Arthritis   . Chronic kidney disease   . Constipation   . GERD (gastroesophageal reflux disease)   . Hypertension   . Hypothyroidism   . Osteoporosis   . Sleep apnea   . Urinary incontinence     Patient Active Problem List   Diagnosis Date Noted  . Sepsis ([place]) 12/17/2016    Past Surgical History:  Procedure Laterality Date  . BREAST BIOPSY Right    neg/stereo  . BREAST BIOPSY Left    neg/stereo  . BREAST EXCISIONAL BIOPSY Right    neg  . COLONOSCOPY WITH PROPOFOL N/A 07/02/2015   Procedure: COLONOSCOPY WITH PROPOFOL;  Surgeon: Manya Silvas, MD;  Location[facility] ENDOSCOPY;  Service: Endoscopy;  Laterality: N/A;  . CORRECTION HAMMER TOE Right   . ESOPHAGOGASTRODUODENOSCOPY (EGD) WITH PROPOFOL N/A 07/02/2015   Procedure: ESOPHAGOGASTRODUODENOSCOPY (EGD) WITH PROPOFOL;  Surgeon: Manya Silvas, MD;  Location[facility] ENDOSCOPY;  Service: Endoscopy;  Laterality: N/A;  . JOINT REPLACEMENT Left    knee   . REPLACEMENT TOTAL KNEE Left   . SKIN BIOPSY Right     Prior to Admission medications   Medication Sig Start Date End Date Taking? Authorizing Provider  amLODipine (NORVASC) 10 MG tablet Take 10 mg by mouth daily.    [provider]  aspirin EC 81 MG tablet Take 81 mg by mouth daily.    [provider]  Cholecalciferol (VITAMIN D PO) Take 1 tablet by mouth daily.    [provider]  isosorbide mononitrate (IMDUR) 30 MG 24 hr tablet Take 30 mg by mouth daily.    [provider]  levothyroxine (SYNTHROID, LEVOTHROID) 50 MCG tablet Take 50 mcg by mouth daily before breakfast.    [provider]  LORazepam (ATIVAN) 2 MG tablet Take 2 mg by mouth every 8 (eight) hours as needed for anxiety.     [provider]  magnesium oxide (MAG-OX) 400 MG tablet Take 400 mg by mouth daily.    [provider]  ondansetron (ZOFRAN ODT) 4 MG disintegrating tablet Take 1 tablet (4 mg total) by mouth every 8 (eight) hours as needed for nausea or vomiting. 12/18/17   Lavonia Drafts, MD  ondansetron (ZOFRAN) 4 MG tablet Take 1 tablet (4 mg total) by mouth daily as needed for nausea or vomiting. Patient not taking: Reported on 12/18/2017 11/08/15   Earleen Newport, MD  oxyCODONE (ROXICODONE) 5 MG immediate release tablet Take 1 tablet (5 mg total) by mouth  every 8 (eight) hours as needed. 02/12/18 02/12/19  Rudene Re, MD  pantoprazole (PROTONIX) 40 MG tablet Take 40 mg by mouth daily.    [provider]  Polyethyl Glycol-Propyl Glycol (SYSTANE OP) Place 1 drop into both eyes 2 (two) times daily.    [provider]  potassium chloride SA (K-DUR,KLOR-CON) 20 MEQ tablet Take 20 mEq by mouth every morning.    [provider]  pravastatin (PRAVACHOL) 40 MG tablet Take 40 mg by mouth at bedtime.     [provider]  sertraline (ZOLOFT) 50 MG tablet Take 50 mg by mouth at bedtime.     [provider]     Allergies  Allergen Reactions  . Ace Inhibitors Cough  . Amoxicillin Rash    Has patient had a PCN reaction causing immediate rash, facial/tongue/throat swelling, SOB or lightheadedness with hypotension: No Has patient had a PCN reaction causing severe rash involving mucus membranes or skin necrosis: No Has patient had a PCN reaction that required hospitalization: No Has patient had a PCN reaction occurring within the last 10 years: No If all of the above answers are "NO", then may proceed with Cephalosporin use.   Marland Kitchen Benicar [Olmesartan] Rash    Itching rash  . Codeine Sulfate Rash  . Levaquin [Levofloxacin In D5w] Rash    Alters mental status  . Penicillin V Potassium Rash    Has patient had a PCN reaction causing immediate rash, facial/tongue/throat swelling, SOB or lightheadedness with hypotension: No Has patient had a PCN reaction causing severe rash involving mucus membranes or skin necrosis: No Has patient had a PCN reaction that required hospitalization: No Has patient had a PCN reaction occurring within the last 10 years: No If all of the above answers are "NO", then may proceed with Cephalosporin use.   . Sulfa Antibiotics Rash    Family History  Problem Relation Age of Onset  . Hypertension Mother   . Diabetes Mother   . Heart attack Mother   . Asthma Mother   . Peripheral vascular disease Mother   . Heart attack Father   . Hypertension Father   . Diabetes Father     Social History Social History   Tobacco Use  . Smoking status: Never Smoker  . Smokeless tobacco: Never Used  Substance Use Topics  . Alcohol use: No  . Drug use: No    Review of Systems Constitutional: Positive for chills today. ENT: Negative for recent illness/congestion Cardiovascular: Chest pressure earlier today, now resolved Respiratory: Shortness of breath earlier today, now resolved Gastrointestinal: Negative for abdominal pain, vomiting and diarrhea. Genitourinary: Negative  for urinary compaints Musculoskeletal: Negative for musculoskeletal complaints Skin: Negative for skin complaints  Neurological: Headache earlier today, now resolved.  Positive for confusion. All other ROS negative  ____________________________________________   PHYSICAL EXAM:  VITAL SIGNS: ED Triage Vitals  Enc Vitals Group     BP --      Pulse Rate 06/30/18 1758 93     Resp 06/30/18 1758 15     Temp 06/30/18 1758 98.3 F (36.8 C)     Temp Source 06/30/18 1758 Oral     SpO2 06/30/18 1752 98 %     Weight 06/30/18 1755 175 lb (79.4 kg)     Height 06/30/18 1755 5\' 3"  (1.6 m)     Head Circumference --      Peak Flow --      Pain Score 06/30/18 1755 0     Pain  Loc --      Pain Edu? --      Excl. in [place]? --     Constitutional: Alert.  Patient is oriented able to give appropriate answers but it takes the patient prolonged time to answer questions which the friend states is abnormal.  Patient did call the friend by the wrong name several times which she also states is abnormal. Eyes: Normal exam ENT   Head: Normocephalic and atraumatic.   Mouth/Throat: Mucous membranes are moist. Cardiovascular: Normal rate, regular rhythm. Respiratory: Normal respiratory effort without tachypnea nor retractions. Breath sounds are clear  Gastrointestinal: Soft and nontender. No distention.   Musculoskeletal: Nontender with normal range of motion in all extremities.  Neurologic:  Normal speech and language. No gross focal neurologic deficits.  Equal grip strengths.  5/5 motor in all extremities.  No cranial nerve deficit. Skin:  Skin is warm, dry and intact.  Psychiatric: Mood and affect are normal. Speech and behavior are normal.   ____________________________________________    EKG  EKG reviewed and interpreted by myself shows normal sinus rhythm at 91 bpm with a slightly widened QRS, normal axis, normal intervals, nonspecific ST changes without ST  elevation  ____________________________________________    RADIOLOGY  Chest x-ray negative  ____________________________________________   INITIAL IMPRESSION / ASSESSMENT AND PLAN / ED COURSE  Pertinent labs & imaging results that were available during my care of the patient were reviewed by me and considered in my medical decision making (see chart for details).  Patient presents to the emergency department for altered mental status today as well as chest pain and headache earlier today which have since resolved.  Differential would include TIA, CVA, ACS, electrolyte or metabolic abnormality, infectious etiology.  We will check labs, CT scan of the head, urinalysis and continue to closely monitor.  Patient agreeable to this plan of care.  Patient's work-up has resulted largely within normal limits.  However given the patient's history of extreme confusion with headache, not knowing who her friend was, her symptoms are very suggestive of TIA.  CT scan of the head is negative.  Patient is improved.  At this time the patient feels well and family states she is back to her baseline.  Her blood pressure is also decreased currently in the 140 is from earlier over 220 per EMS.  This could be hypertensive induced encephalopathy as well.  I believe the patient would benefit from admission to the hospital for further work-up.  Patient agreeable to plan of care.  ____________________________________________   FINAL CLINICAL IMPRESSION(S) / ED DIAGNOSES  Altered mental status Chest pain    Harvest Dark, MD 06/30/18 2124

## 2018-06-30 NOTE — ED Notes (Signed)
Patient transported to X-ray 

## 2018-06-30 NOTE — ED Notes (Signed)
EMERGENCY CONTACT  BOBBIE GRAY [phone])

## 2018-06-30 NOTE — ED Notes (Signed)
.   Pt is resting, Respirations even and unlabored, NAD. Stretcher lowest postion and locked. Call bell within reach. Denies any needs at this time RN will continue to monitor.    

## 2018-06-30 NOTE — ED Triage Notes (Addendum)
Pt arrived via ems for report of weakness for the last month and a cough that has been for several weeks - pt was at PCP Aug 5th and they had negative exam

## 2018-06-30 NOTE — H&P (Signed)
[facility] [place] NAME: Erin Sutton    MR#:  [id number]  DATE OF BIRTH:  Jan 25, 1941  DATE OF ADMISSION:  06/30/2018  PRIMARY CARE PHYSICIAN: Baxter Hire, MD   REQUESTING/REFERRING PHYSICIAN: Kerman Passey, MD  CHIEF COMPLAINT:   Chief Complaint  Patient presents with  . Weakness    HISTORY OF PRESENT ILLNESS:  Erin Sutton  is a 77 y.o. female who presents with chief complaint as above.  Patient states that she took her blood pressure earlier today and that it was low.  She called a friend because of that, and her friend states that she was speaking very confused over the phone.  Her friend went over to see the patient and the patient was lethargic and confused still, so she called patient's primary care doctor who recommended they call EMS.  EMS arrived and found her blood pressure was significantly elevated, with systolic in the 761Y.  They checked this compared to her pressure device and found that her device was faulty, reading a low pressure erroneously.  She was brought to the ED, and here her blood pressure corrected after a brief period of time.  After her blood pressure came down she became more alert, less confused, and returned very close to what is her baseline mental functioning.  Initial work-up is largely within normal limits.  Hospitalist called for admission and further evaluation.  PAST MEDICAL HISTORY:   Past Medical History:  Diagnosis Date  . Anxiety   . Arthritis   . Chronic kidney disease   . Constipation   . GERD (gastroesophageal reflux disease)   . Hypertension   . Hypothyroidism   . Osteoporosis   . Sleep apnea   . Urinary incontinence      PAST SURGICAL HISTORY:   Past Surgical History:  Procedure Laterality Date  . BREAST BIOPSY Right    neg/stereo  . BREAST BIOPSY Left    neg/stereo  . BREAST EXCISIONAL BIOPSY Right    neg  . COLONOSCOPY WITH PROPOFOL N/A 07/02/2015   Procedure:  COLONOSCOPY WITH PROPOFOL;  Surgeon: Manya Silvas, MD;  Location: [facility] ENDOSCOPY;  Service: Endoscopy;  Laterality: N/A;  . CORRECTION HAMMER TOE Right   . ESOPHAGOGASTRODUODENOSCOPY (EGD) WITH PROPOFOL N/A 07/02/2015   Procedure: ESOPHAGOGASTRODUODENOSCOPY (EGD) WITH PROPOFOL;  Surgeon: Manya Silvas, MD;  Location: [facility] ENDOSCOPY;  Service: Endoscopy;  Laterality: N/A;  . JOINT REPLACEMENT Left    knee  . REPLACEMENT TOTAL KNEE Left   . SKIN BIOPSY Right      SOCIAL HISTORY:   Social History   Tobacco Use  . Smoking status: Never Smoker  . Smokeless tobacco: Never Used  Substance Use Topics  . Alcohol use: No     FAMILY HISTORY:   Family History  Problem Relation Age of Onset  . Hypertension Mother   . Diabetes Mother   . Heart attack Mother   . Asthma Mother   . Peripheral vascular disease Mother   . Heart attack Father   . Hypertension Father   . Diabetes Father      DRUG ALLERGIES:   Allergies  Allergen Reactions  . Ace Inhibitors Cough  . Amoxicillin Rash    Has patient had a PCN reaction causing immediate rash, facial/tongue/throat swelling, SOB or lightheadedness with hypotension: No Has patient had a PCN reaction causing severe rash involving mucus membranes or skin necrosis: No Has patient had a PCN reaction that required hospitalization:  No Has patient had a PCN reaction occurring within the last 10 years: No If all of the above answers are "NO", then may proceed with Cephalosporin use.   Marland Kitchen Benicar [Olmesartan] Rash    Itching rash  . Codeine Sulfate Rash  . Levaquin [Levofloxacin In D5w] Rash    Alters mental status  . Penicillin V Potassium Rash    Has patient had a PCN reaction causing immediate rash, facial/tongue/throat swelling, SOB or lightheadedness with hypotension: No Has patient had a PCN reaction causing severe rash involving mucus membranes or skin necrosis: No Has patient had a PCN reaction that required hospitalization: No Has  patient had a PCN reaction occurring within the last 10 years: No If all of the above answers are "NO", then may proceed with Cephalosporin use.   . Sulfa Antibiotics Rash    MEDICATIONS AT HOME:   Prior to Admission medications   Medication Sig Start Date End Date Taking? Authorizing Provider  amLODipine (NORVASC) 10 MG tablet Take 10 mg by mouth daily.    [provider]  aspirin EC 81 MG tablet Take 81 mg by mouth daily.    [provider]  Cholecalciferol (VITAMIN D PO) Take 1 tablet by mouth daily.    [provider]  isosorbide mononitrate (IMDUR) 30 MG 24 hr tablet Take 30 mg by mouth daily.    [provider]  levothyroxine (SYNTHROID, LEVOTHROID) 50 MCG tablet Take 50 mcg by mouth daily before breakfast.    [provider]  LORazepam (ATIVAN) 2 MG tablet Take 2 mg by mouth every 8 (eight) hours as needed for anxiety.     [provider]  magnesium oxide (MAG-OX) 400 MG tablet Take 400 mg by mouth daily.    [provider]  ondansetron (ZOFRAN ODT) 4 MG disintegrating tablet Take 1 tablet (4 mg total) by mouth every 8 (eight) hours as needed for nausea or vomiting. 12/18/17   Lavonia Drafts, MD  ondansetron (ZOFRAN) 4 MG tablet Take 1 tablet (4 mg total) by mouth daily as needed for nausea or vomiting. Patient not taking: Reported on 12/18/2017 11/08/15   Earleen Newport, MD  oxyCODONE (ROXICODONE) 5 MG immediate release tablet Take 1 tablet (5 mg total) by mouth every 8 (eight) hours as needed. 02/12/18 02/12/19  Rudene Re, MD  pantoprazole (PROTONIX) 40 MG tablet Take 40 mg by mouth daily.    [provider]  Polyethyl Glycol-Propyl Glycol (SYSTANE OP) Place 1 drop into both eyes 2 (two) times daily.    [provider]  potassium chloride SA (K-DUR,KLOR-CON) 20 MEQ tablet Take 20 mEq by mouth every morning.    [provider]  pravastatin (PRAVACHOL) 40 MG tablet Take 40 mg by mouth  at bedtime.     [provider]  sertraline (ZOLOFT) 50 MG tablet Take 50 mg by mouth at bedtime.     [provider]    REVIEW OF SYSTEMS:  Review of Systems  Constitutional: Negative for chills, fever, malaise/fatigue and weight loss.  HENT: Negative for ear pain, hearing loss and tinnitus.   Eyes: Negative for blurred vision, double vision, pain and redness.  Respiratory: Negative for cough, hemoptysis and shortness of breath.   Cardiovascular: Negative for chest pain, palpitations, orthopnea and leg swelling.  Gastrointestinal: Negative for abdominal pain, constipation, diarrhea, nausea and vomiting.  Genitourinary: Negative for dysuria, frequency and hematuria.  Musculoskeletal: Negative for back pain, joint pain and neck pain.  Skin:  No acne, rash, or lesions  Neurological: Negative for dizziness, tremors, focal weakness and weakness.       Confusion and lethargy  Endo/Heme/Allergies: Negative for polydipsia. Does not bruise/bleed easily.  Psychiatric/Behavioral: Negative for depression. The patient is not nervous/anxious and does not have insomnia.      VITAL SIGNS:   Vitals:   06/30/18 1930 06/30/18 2000 06/30/18 2030 06/30/18 2239  BP: (!) 192/91 (!) 180/74 (!) 184/73   Pulse: 81 79 77   Resp: 16 (!) 21 10   Temp:    98.3 F (36.8 C)  TempSrc:      SpO2: 98% 95% 96%   Weight:      Height:       Wt Readings from Last 3 Encounters:  06/30/18 79.4 kg (175 lb)  02/12/18 80.3 kg (177 lb 1.6 oz)  12/18/17 75.8 kg (167 lb)    PHYSICAL EXAMINATION:  Physical Exam  Vitals reviewed. Constitutional: She is oriented to person, place, and time. She appears well-developed and well-nourished. No distress.  HENT:  Head: Normocephalic and atraumatic.  Mouth/Throat: Oropharynx is clear and moist.  Eyes: Pupils are equal, round, and reactive to light. Conjunctivae and EOM are normal. No scleral icterus.  Neck: Normal range of motion. Neck supple. No JVD  present. No thyromegaly present.  Cardiovascular: Normal rate, regular rhythm and intact distal pulses. Exam reveals no gallop and no friction rub.  No murmur heard. Respiratory: Effort normal and breath sounds normal. No respiratory distress. She has no wheezes. She has no rales.  GI: Soft. Bowel sounds are normal. She exhibits no distension. There is no tenderness.  Musculoskeletal: Normal range of motion. She exhibits no edema.  No arthritis, no gout  Lymphadenopathy:    She has no cervical adenopathy.  Neurological: She is alert and oriented to person, place, and time. No cranial nerve deficit.  No dysarthria, no aphasia  Skin: Skin is warm and dry. No rash noted. No erythema.  Psychiatric: She has a normal mood and affect. Her behavior is normal. Judgment and thought content normal.    LABORATORY PANEL:   CBC Recent Labs  Lab 06/30/18 1806  WBC 7.5  HGB 12.4  HCT 37.4  PLT 330   ------------------------------------------------------------------------------------------------------------------  Chemistries  Recent Labs  Lab 06/30/18 1806  NA 142  K 3.7  CL 105  CO2 26  GLUCOSE 112*  BUN 14  CREATININE 0.81  CALCIUM 10.4*  AST 31  ALT 12  ALKPHOS 63  BILITOT 0.6   ------------------------------------------------------------------------------------------------------------------  Cardiac Enzymes Recent Labs  Lab 06/30/18 1806  TROPONINI <0.03   ------------------------------------------------------------------------------------------------------------------  RADIOLOGY:  Dg Chest 2 View  Result Date: 06/30/2018 CLINICAL DATA:  77 year old female with weakness and cough EXAM: CHEST - 2 VIEW COMPARISON:  Prior chest x-ray 12/18/2017 FINDINGS: Cardiac and mediastinal contours are within normal limits. Atherosclerotic calcifications are present in the transverse aorta. No focal airspace consolidation, pleural effusion or pneumothorax. No evidence of pulmonary edema  or suspicious nodule or mass. Minimal chronic bronchitic changes. Remote healed right proximal humerus fracture. 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 IMPRESSION: No acute cardiopulmonary process. Aortic Atherosclerosis (ICD10-170.0) Electronically Signed   By: Jacqulynn Cadet M.D.   On: 06/30/2018 19:46   Ct Head Wo Contrast  Result Date: 06/30/2018 CLINICAL DATA:  77 year old female with a history of weakness EXAM: CT HEAD WITHOUT CONTRAST TECHNIQUE: Contiguous axial images were obtained from the base of the skull through the vertex without intravenous contrast. COMPARISON:  CT  02/12/2018 FINDINGS: Brain: No acute intracranial hemorrhage. No midline shift or mass effect. Gray-white differentiation relatively maintained. Senescent calcifications in the basal ganglia. Patchy hypodensity in the periventricular white matter. Unremarkable configuration of the ventricles. Vascular: Calcifications of the intracranial vasculature. Skull: No acute displaced fracture. Sinuses/Orbits: Unremarkable appearance of the orbits. No significant paranasal sinus disease Other: None IMPRESSION: Negative for acute intracranial abnormality. Evidence of chronic microvascular ischemic disease Electronically Signed   By: Corrie Mckusick D.O.   On: 06/30/2018 19:10    EKG:   Orders placed or performed during the hospital encounter of 06/30/18  . ED EKG  . ED EKG    IMPRESSION AND PLAN:  Principal Problem:   Accelerated hypertension -patient's blood pressure has been up and down, but has remained less than 474 systolic and her mental function in the ED has corrected to what seems to be her baseline per her report and the report of her friend who is at bedside.  Given that her symptoms improved with her blood pressure falling, we will maintain a blood pressure goal less than 259 systolic tonight.  Neurology consult for further insight into her confusion, see below. Active Problems:   Confusion  -questionable whether hypertensive encephalopathy versus TIA or stroke.  The fact that her symptoms improved when her pressure fell on its own argues against stroke, however we will get a neurology consult for further insult into whether MRI or other work-up is considered appropriate   HTN (hypertension) -continue home meds with blood pressure goal as above   Hypothyroidism -home dose thyroid replacement   GERD (gastroesophageal reflux disease) -Home dose PPI   Anxiety -home dose anxiolytic   Chart review performed and case discussed with ED provider. Labs, imaging and/or ECG reviewed by provider and discussed with patient/family. Management plans discussed with the patient and/or family.  DVT PROPHYLAXIS: SubQ lovenox   GI PROPHYLAXIS:  PPI   ADMISSION STATUS: Observation  CODE STATUS: Full Code Status History    Date Active Date Inactive Code Status Order ID Comments User Context   12/17/2016 1751 12/21/2016 1748 Full Code [id number]  Demetrios Loll, MD Inpatient      TOTAL TIME TAKING CARE OF THIS PATIENT: 40 minutes.   Masashi Snowdon FIELDING 06/30/2018, 10:53 PM  [facility]  Office  [phone]  CC: Primary care physician; Baxter Hire, MD  Note:  This document was prepared using Dragon voice recognition software and may include unintentional dictation errors.

## 2018-07-01 ENCOUNTER — Observation Stay
Admit: 2018-07-01 | Discharge: 2018-07-01 | Disposition: A | Discharge status: Home / Self Care | Payer: Medicare PPO | Attending: Internal Medicine | Admitting: Internal Medicine

## 2018-07-01 ENCOUNTER — Observation Stay: Payer: Medicare PPO

## 2018-07-01 DIAGNOSIS — R410 Disorientation, unspecified: Secondary | ICD-10-CM

## 2018-07-01 LAB — CBC
HCT: 34.4 % — ABNORMAL LOW (ref 35.0–47.0)
Hemoglobin: 11.5 g/dL — ABNORMAL LOW (ref 12.0–16.0)
MCH: 28.5 pg (ref 26.0–34.0)
MCHC: 33.4 g/dL (ref 32.0–36.0)
MCV: 85.3 fL (ref 80.0–100.0)
Platelets: 308 10*3/uL (ref 150–440)
RBC: 4.04 MIL/uL (ref 3.80–5.20)
RDW: 16.6 % — ABNORMAL HIGH (ref 11.5–14.5)
WBC: 7.6 10*3/uL (ref 3.6–11.0)

## 2018-07-01 LAB — ECHOCARDIOGRAM COMPLETE
Height: 63 in
Weight: 2800 oz

## 2018-07-01 LAB — BASIC METABOLIC PANEL
Anion gap: 11 (ref 5–15)
BUN: 11 mg/dL (ref 8–23)
CO2: 27 mmol/L (ref 22–32)
Calcium: 9.8 mg/dL (ref 8.9–10.3)
Chloride: 104 mmol/L (ref 98–111)
Creatinine, Ser: 0.81 mg/dL (ref 0.44–1.00)
GFR calc Af Amer: >60 mL/min (ref >60)
GFR calc non Af Amer: >60 mL/min (ref >60)
Glucose, Bld: 107 mg/dL — ABNORMAL HIGH (ref 70–99)
Potassium: 3.6 mmol/L (ref 3.5–5.1)
Sodium: 142 mmol/L (ref 135–145)

## 2018-07-01 LAB — LIPID PANEL
Cholesterol: 178 mg/dL (ref 0–200)
HDL: 66 mg/dL (ref >40)
LDL Cholesterol: 97 mg/dL (ref 0–99)
Total CHOL/HDL Ratio: 2.7 RATIO
Triglycerides: 77 mg/dL (ref <150)
VLDL: 15 mg/dL (ref 0–40)

## 2018-07-01 MED ORDER — PRAVASTATIN SODIUM 20 MG PO TABS
40.0000 mg | ORAL_TABLET | Freq: Every day | ORAL | Status: DC
  Administered 2018-07-01: 40 mg via ORAL
  Filled 2018-07-01: qty 2

## 2018-07-01 MED ORDER — ONDANSETRON HCL 4 MG/2ML IJ SOLN
4.0000 mg | Freq: Four times a day (QID) | INTRAMUSCULAR | Status: DC | PRN
  Administered 2018-07-01 (×2): 4 mg via INTRAVENOUS
  Filled 2018-07-01 (×2): qty 2

## 2018-07-01 MED ORDER — ASPIRIN EC 81 MG PO TBEC
81.0000 mg | DELAYED_RELEASE_TABLET | Freq: Every day | ORAL | Status: DC
  Administered 2018-07-01 – 2018-07-02 (×2): 81 mg via ORAL
  Filled 2018-07-01 (×3): qty 1

## 2018-07-01 MED ORDER — SERTRALINE HCL 50 MG PO TABS: 50.0000 mg | ORAL_TABLET | Freq: Every day | ORAL | Status: DC

## 2018-07-01 MED ORDER — PANTOPRAZOLE SODIUM 40 MG PO TBEC
40.0000 mg | DELAYED_RELEASE_TABLET | Freq: Every day | ORAL | Status: DC
  Administered 2018-07-01 – 2018-07-02 (×2): 40 mg via ORAL
  Filled 2018-07-01 (×3): qty 1

## 2018-07-01 MED ORDER — CARVEDILOL 3.125 MG PO TABS
12.5000 mg | ORAL_TABLET | Freq: Two times a day (BID) | ORAL | Status: DC
  Administered 2018-07-01 – 2018-07-02 (×2): 12.5 mg via ORAL
  Filled 2018-07-01 (×3): qty 4

## 2018-07-01 MED ORDER — ISOSORBIDE MONONITRATE ER 30 MG PO TB24
30.0000 mg | ORAL_TABLET | Freq: Every day | ORAL | Status: DC
  Administered 2018-07-01: 17:00:00 30 mg via ORAL
  Filled 2018-07-01 (×3): qty 1

## 2018-07-01 MED ORDER — HYDRALAZINE HCL 20 MG/ML IJ SOLN
10.0000 mg | Freq: Four times a day (QID) | INTRAMUSCULAR | Status: DC | PRN
  Administered 2018-07-01: 13:00:00 10 mg via INTRAVENOUS
  Filled 2018-07-01: qty 1

## 2018-07-01 MED ORDER — ONDANSETRON HCL 4 MG PO TABS
4.0000 mg | ORAL_TABLET | Freq: Four times a day (QID) | ORAL | Status: DC | PRN
Start: 2018-07-01 — End: 2018-07-03

## 2018-07-01 MED ORDER — ACETAMINOPHEN 650 MG RE SUPP: 650.0000 mg | Freq: Four times a day (QID) | RECTAL | Status: DC | PRN

## 2018-07-01 MED ORDER — ACETAMINOPHEN 325 MG PO TABS: 650.0000 mg | ORAL_TABLET | Freq: Four times a day (QID) | ORAL | Status: DC | PRN

## 2018-07-01 MED ORDER — LORAZEPAM 2 MG PO TABS
2.0000 mg | ORAL_TABLET | Freq: Three times a day (TID) | ORAL | Status: DC | PRN
  Administered 2018-07-01: 21:00:00 2 mg via ORAL
  Filled 2018-07-01: qty 1

## 2018-07-01 MED ORDER — LORAZEPAM 2 MG/ML IJ SOLN
1.0000 mg | Freq: Once | INTRAMUSCULAR | Status: AC
  Administered 2018-07-01: 14:00:00 1 mg via INTRAVENOUS
  Filled 2018-07-01: qty 1

## 2018-07-01 MED ORDER — LEVOTHYROXINE SODIUM 50 MCG PO TABS
50.0000 ug | ORAL_TABLET | Freq: Every day | ORAL | Status: DC
  Administered 2018-07-02: 09:00:00 50 ug via ORAL
  Filled 2018-07-01 (×2): qty 1

## 2018-07-01 MED ORDER — AMLODIPINE BESYLATE 10 MG PO TABS
10.0000 mg | ORAL_TABLET | Freq: Every day | ORAL | Status: DC
  Administered 2018-07-01: 17:00:00 10 mg via ORAL
  Filled 2018-07-01 (×3): qty 1

## 2018-07-01 MED ORDER — ENOXAPARIN SODIUM 40 MG/0.4ML ~~LOC~~ SOLN
40.0000 mg | SUBCUTANEOUS | Status: DC
  Administered 2018-07-01: 21:00:00 40 mg via SUBCUTANEOUS
  Filled 2018-07-01: qty 0.4

## 2018-07-01 MED ORDER — LORAZEPAM 2 MG/ML IJ SOLN: 1.0000 mg | Freq: Once | INTRAMUSCULAR | Status: DC

## 2018-07-01 NOTE — Consult Note (Signed)
Reason for Consult:Right leg numbness, AMS Referring Physician: Anselm Jungling  CC: Right leg weakness, AMS  HPI: Erin Sutton is an 77 y.o. female with multiple complaints.  Admission was initiated by friend who found her confused/lethargic and hypertensive on yesterday.  Patient was brought to the ED where BP was controlled and confusion/lelthargy appeared to clear.  Today has had episodes when her speech becomes stuttering or slurred.  Reportedly this has happened in the past but it is unclear the frequency of events.  Episodes appear to resolve spontaneously.  Patient also reports that her right leg is weak and cold.  This has been present for some time and is not new.  She has had surgery on the right leg and there is hardware in place.    Past Medical History:  Diagnosis Date  . Anxiety   . Arthritis   . Chronic kidney disease   . Constipation   . GERD (gastroesophageal reflux disease)   . Hypertension   . Hypothyroidism   . Osteoporosis   . Sleep apnea   . Urinary incontinence     Past Surgical History:  Procedure Laterality Date  . BREAST BIOPSY Right    neg/stereo  . BREAST BIOPSY Left    neg/stereo  . BREAST EXCISIONAL BIOPSY Right    neg  . COLONOSCOPY WITH PROPOFOL N/A 07/02/2015   Procedure: COLONOSCOPY WITH PROPOFOL;  Surgeon: Manya Silvas, MD;  Location: [facility] ENDOSCOPY;  Service: Endoscopy;  Laterality: N/A;  . CORRECTION HAMMER TOE Right   . ESOPHAGOGASTRODUODENOSCOPY (EGD) WITH PROPOFOL N/A 07/02/2015   Procedure: ESOPHAGOGASTRODUODENOSCOPY (EGD) WITH PROPOFOL;  Surgeon: Manya Silvas, MD;  Location: [facility] ENDOSCOPY;  Service: Endoscopy;  Laterality: N/A;  . JOINT REPLACEMENT Left    knee  . REPLACEMENT TOTAL KNEE Left   . SKIN BIOPSY Right     Family History  Problem Relation Age of Onset  . Hypertension Mother   . Diabetes Mother   . Heart attack Mother   . Asthma Mother   . Peripheral vascular disease Mother   . Heart attack Father   . Hypertension  Father   . Diabetes Father     Social History:  reports that she has never smoked. She has never used smokeless tobacco. She reports that she does not drink alcohol or use drugs.  Allergies  Allergen Reactions  . Ace Inhibitors Cough  . Amoxicillin Rash    Has patient had a PCN reaction causing immediate rash, facial/tongue/throat swelling, SOB or lightheadedness with hypotension: No Has patient had a PCN reaction causing severe rash involving mucus membranes or skin necrosis: No Has patient had a PCN reaction that required hospitalization: No Has patient had a PCN reaction occurring within the last 10 years: No If all of the above answers are "NO", then may proceed with Cephalosporin use.   Marland Kitchen Benicar [Olmesartan] Rash    Itching rash  . Codeine Sulfate Rash  . Levaquin [Levofloxacin In D5w] Rash    Alters mental status  . Penicillin V Potassium Rash    Has patient had a PCN reaction causing immediate rash, facial/tongue/throat swelling, SOB or lightheadedness with hypotension: No Has patient had a PCN reaction causing severe rash involving mucus membranes or skin necrosis: No Has patient had a PCN reaction that required hospitalization: No Has patient had a PCN reaction occurring within the last 10 years: No If all of the above answers are "NO", then may proceed with Cephalosporin use.   . Sulfa Antibiotics Rash  Medications:  I have reviewed the patient's current medications. Prior to Admission:  Medications Prior to Admission  Medication Sig Dispense Refill Last Dose  . amLODipine (NORVASC) 10 MG tablet Take 10 mg by mouth daily.   06/30/2018 at Unknown time  . aspirin EC 81 MG tablet Take 81 mg by mouth daily.   06/30/2018 at Unknown time  . Cholecalciferol (VITAMIN D PO) Take 1 tablet by mouth daily.   06/30/2018 at Unknown time  . isosorbide mononitrate (IMDUR) 30 MG 24 hr tablet Take 30 mg by mouth daily.   06/30/2018 at Unknown time  . levothyroxine (SYNTHROID, LEVOTHROID)  50 MCG tablet Take 50 mcg by mouth daily before breakfast.   06/30/2018 at Unknown time  . LORazepam (ATIVAN) 2 MG tablet Take 2 mg by mouth every 8 (eight) hours as needed for anxiety.    prn at prn  . magnesium oxide (MAG-OX) 400 MG tablet Take 400 mg by mouth daily.   06/30/2018 at Unknown time  . pantoprazole (PROTONIX) 40 MG tablet Take 40 mg by mouth daily.   06/30/2018 at Unknown time  . Polyethyl Glycol-Propyl Glycol (SYSTANE OP) Place 1 drop into both eyes 2 (two) times daily.   06/30/2018 at Unknown time  . potassium chloride SA (K-DUR,KLOR-CON) 20 MEQ tablet Take 20 mEq by mouth every morning.   06/30/2018 at Unknown time  . pravastatin (PRAVACHOL) 40 MG tablet Take 40 mg by mouth at bedtime.    06/29/2018 at Unknown time  . sertraline (ZOLOFT) 50 MG tablet Take 50 mg by mouth at bedtime.    06/30/2018 at Unknown time   Scheduled: . amLODipine  10 mg Oral Daily  . aspirin EC  81 mg Oral Daily  . carvedilol  12.5 mg Oral BID WC  . enoxaparin (LOVENOX) injection  40 mg Subcutaneous Q24H  . isosorbide mononitrate  30 mg Oral Daily  . levothyroxine  50 mcg Oral QAC breakfast  . LORazepam  1 mg Intravenous Once  . pantoprazole  40 mg Oral Daily  . pravastatin  40 mg Oral QHS  . sertraline  50 mg Oral QHS    ROS: History obtained from the patient  General ROS: chills Psychological ROS: negative for - behavioral disorder, hallucinations, memory difficulties, mood swings or suicidal ideation Ophthalmic ROS: negative for - blurry vision, double vision, eye pain or loss of vision ENT ROS: dysphagia Allergy and Immunology ROS: negative for - hives or itchy/watery eyes Hematological and Lymphatic ROS: negative for - bleeding problems, bruising or swollen lymph nodes Endocrine ROS: temperature intolerance Respiratory ROS: cough, hemoptysis Cardiovascular ROS: negative for - chest pain, dyspnea on exertion, edema or irregular heartbeat Gastrointestinal ROS: negative for - abdominal pain, diarrhea,  hematemesis, nausea/vomiting or stool incontinence Genito-Urinary ROS: negative for - dysuria, hematuria, incontinence or urinary frequency/urgency Musculoskeletal ROS: as noted in HPI Neurological ROS: as noted in HPI Dermatological ROS: negative for rash and skin lesion changes  Physical Examination: Blood pressure (!) 145/65, pulse 93, temperature 98.8 F (37.1 C), temperature source Oral, resp. rate 18, height 5' 3"  (1.6 m), weight 79.4 kg, SpO2 99 %.  HEENT-  Normocephalic, no lesions, without obvious abnormality.  Normal external eye and conjunctiva.  Normal TM's bilaterally.  Normal auditory canals and external ears. Normal external nose, mucus membranes and septum.  Normal pharynx. Cardiovascular- S1, S2 normal, pulses palpable throughout   Lungs- chest clear, no wheezing, rales, normal symmetric air entry Abdomen- soft, non-tender; bowel sounds normal; no masses,  no  organomegaly Extremities- no edema Lymph-no adenopathy palpable Musculoskeletal-no joint tenderness, deformity or swelling Skin-warm and dry, no hyperpigmentation, vitiligo, or suspicious lesions  Neurological Examination   Mental Status: Alert, oriented, thought content appropriate.  Speech fluent without evidence of aphasia.  Some occasional stuttering and word finding noted.  Able to follow 3 step commands without difficulty. Cranial Nerves: II: Discs flat bilaterally; Visual fields grossly normal, pupils equal, round, reactive to light and accommodation III,IV, VI: ptosis not present, extra-ocular motions intact bilaterally V,VII: smile symmetric, facial light touch sensation normal bilaterally VIII: hearing normal bilaterally IX,X: gag reflex present XI: bilateral shoulder shrug XII: midline tongue extension Motor: Right : Upper extremity   5/5    Left:     Upper extremity   5/5  Lower extremity   5-/5     Lower extremity   5/5 Tone and bulk:normal tone throughout; no atrophy noted Sensory: Pinprick and  light touch intact throughout, bilaterally Deep Tendon Reflexes: 2+ in the upper extremities and absent in the lower extremities Plantars: Right: downgoing   Left: downgoing Cerebellar: Normal finger-to-nose and normal heel-to-shin testing bilaterally Gait: not tested due to safety concerns    Laboratory Studies:   Basic Metabolic Panel: Recent Labs  Lab 06/30/18 1806 07/01/18 0506  NA 142 142  K 3.7 3.6  CL 105 104  CO2 26 27  GLUCOSE 112* 107*  BUN 14 11  CREATININE 0.81 0.81  CALCIUM 10.4* 9.8    Liver Function Tests: Recent Labs  Lab 06/30/18 1806  AST 31  ALT 12  ALKPHOS 63  BILITOT 0.6  PROT 8.6*  ALBUMIN 4.8   No results for input(s): LIPASE, AMYLASE in the last 168 hours. No results for input(s): AMMONIA in the last 168 hours.  CBC: Recent Labs  Lab 06/30/18 1806 07/01/18 0506  WBC 7.5 7.6  HGB 12.4 11.5*  HCT 37.4 34.4*  MCV 85.2 85.3  PLT 330 308    Cardiac Enzymes: Recent Labs  Lab 06/30/18 1806  TROPONINI <0.03    BNP: Invalid input(s): POCBNP  CBG: No results for input(s): GLUCAP in the last 168 hours.  Microbiology: Results for orders placed or performed during the hospital encounter of 12/17/16  Culture, blood (routine x 2)     Status: None   Collection Time: 12/17/16  1:33 PM  Result Value Ref Range Status   Specimen Description BLOOD L AC  Final   Special Requests   Final    BOTTLES DRAWN AEROBIC AND ANAEROBIC AER 13ML ANA 14ML   Culture NO GROWTH 5 DAYS  Final   Report Status 12/22/2016 FINAL  Final  Culture, blood (routine x 2)     Status: Abnormal   Collection Time: 12/17/16  1:34 PM  Result Value Ref Range Status   Specimen Description BLOOD R AC  Final   Special Requests   Final    BOTTLES DRAWN AEROBIC AND ANAEROBIC AER 14ML ANA 15ML   Culture  Setup Time   Final    GRAM POSITIVE COCCI ANAEROBIC BOTTLE ONLY CRITICAL RESULT CALLED TO, READ BACK BY AND VERIFIED WITH:  MATT MCBANE AT 0700 12/18/16 SDR    Culture  (A)  Final    GROUP A STREP (S.PYOGENES) ISOLATED HEALTH DEPARTMENT NOTIFIED Performed at [facility], [address] [place], [place] [address]    Report Status 12/20/2016 FINAL  Final   Organism ID, Bacteria GROUP A STREP (S.PYOGENES) ISOLATED  Final      Susceptibility  Group a strep (s.pyogenes) isolated - MIC*    ERYTHROMYCIN <=0.12 SENSITIVE Sensitive     TETRACYCLINE <=0.25 SENSITIVE Sensitive     VANCOMYCIN <=0.12 SENSITIVE Sensitive     CLINDAMYCIN <=0.25 SENSITIVE Sensitive     PENICILLIN Value in next row Sensitive      SENSITIVE<=0.06    CEFTRIAXONE Value in next row Sensitive      SENSITIVE<=0.12    * GROUP A STREP (S.PYOGENES) ISOLATED  Urine culture     Status: Abnormal   Collection Time: 12/17/16  1:34 PM  Result Value Ref Range Status   Specimen Description URINE, CLEAN CATCH  Final   Special Requests NONE  Final   Culture MULTIPLE SPECIES PRESENT, SUGGEST RECOLLECTION (A)  Final   Report Status 12/18/2016 FINAL  Final  Blood Culture ID Panel (Reflexed)     Status: Abnormal   Collection Time: 12/17/16  1:34 PM  Result Value Ref Range Status   Enterococcus species NOT DETECTED NOT DETECTED Final   Listeria monocytogenes NOT DETECTED NOT DETECTED Final   Staphylococcus species NOT DETECTED NOT DETECTED Final   Staphylococcus aureus NOT DETECTED NOT DETECTED Final   Streptococcus species DETECTED (A) NOT DETECTED Final    Comment: CRITICAL RESULT CALLED TO, READ BACK BY AND VERIFIED WITH:  MATT MCBANE AT 0700 12/18/16 SDR    Streptococcus agalactiae NOT DETECTED NOT DETECTED Final   Streptococcus pneumoniae NOT DETECTED NOT DETECTED Final   Streptococcus pyogenes DETECTED (A) NOT DETECTED Final    Comment: CRITICAL RESULT CALLED TO, READ BACK BY AND VERIFIED WITH:  MATT MCBANE AT 0700 12/18/16 SDR    Acinetobacter baumannii NOT DETECTED NOT DETECTED Final   Enterobacteriaceae species NOT DETECTED NOT DETECTED Final   Enterobacter cloacae complex NOT  DETECTED NOT DETECTED Final   Escherichia coli NOT DETECTED NOT DETECTED Final   Klebsiella oxytoca NOT DETECTED NOT DETECTED Final   Klebsiella pneumoniae NOT DETECTED NOT DETECTED Final   Proteus species NOT DETECTED NOT DETECTED Final   Serratia marcescens NOT DETECTED NOT DETECTED Final   Haemophilus influenzae NOT DETECTED NOT DETECTED Final   Neisseria meningitidis NOT DETECTED NOT DETECTED Final   Pseudomonas aeruginosa NOT DETECTED NOT DETECTED Final   Candida albicans NOT DETECTED NOT DETECTED Final   Candida glabrata NOT DETECTED NOT DETECTED Final   Candida krusei NOT DETECTED NOT DETECTED Final   Candida parapsilosis NOT DETECTED NOT DETECTED Final   Candida tropicalis NOT DETECTED NOT DETECTED Final  CULTURE, BLOOD (ROUTINE X 2) w Reflex to ID Panel     Status: None   Collection Time: 12/18/16  6:10 PM  Result Value Ref Range Status   Specimen Description BLOOD  RIGHT FOERARM  Final   Special Requests   Final    BOTTLES DRAWN AEROBIC AND ANAEROBIC  AER 7 ML ANA 3 ML   Culture NO GROWTH 5 DAYS  Final   Report Status 12/23/2016 FINAL  Final  CULTURE, BLOOD (ROUTINE X 2) w Reflex to ID Panel     Status: None   Collection Time: 12/18/16  6:10 PM  Result Value Ref Range Status   Specimen Description BLOOD  RIGHT AC  Final   Special Requests   Final    BOTTLES DRAWN AEROBIC AND ANAEROBIC  AER 8 ML ANA 7 ML    Culture NO GROWTH 5 DAYS  Final   Report Status 12/23/2016 FINAL  Final    Coagulation Studies: No results for input(s): LABPROT, INR in the last 72  hours.  Urinalysis:  Recent Labs  Lab 06/30/18 1806  COLORURINE COLORLESS*  LABSPEC 1.004*  PHURINE 8.0  GLUCOSEU NEGATIVE  HGBUR SMALL*  BILIRUBINUR NEGATIVE  KETONESUR NEGATIVE  PROTEINUR NEGATIVE  NITRITE NEGATIVE  LEUKOCYTESUR NEGATIVE    Lipid Panel:     Component Value Date/Time   CHOL 178 07/01/2018 0506   TRIG 77 07/01/2018 0506   HDL 66 07/01/2018 0506   CHOLHDL 2.7 07/01/2018 0506   VLDL 15  07/01/2018 0506   LDLCALC 97 07/01/2018 0506    HgbA1C: No results found for: HGBA1C  Urine Drug Screen:  No results found for: LABOPIA, COCAINSCRNUR, LABBENZ, AMPHETMU, THCU, LABBARB  Alcohol Level: No results for input(s): ETH in the last 168 hours.  Other results: EKG: sinus rhythm at 75 bpm.  Imaging: Dg Chest 2 View  Result Date: 06/30/2018 CLINICAL DATA:  77 year old female with weakness and cough EXAM: CHEST - 2 VIEW COMPARISON:  Prior chest x-ray 12/18/2017 FINDINGS: Cardiac and mediastinal contours are within normal limits. Atherosclerotic calcifications are present in the transverse aorta. No focal airspace consolidation, pleural effusion or pneumothorax. No evidence of pulmonary edema or suspicious nodule or mass. Minimal chronic bronchitic changes. Remote healed right proximal humerus fracture. 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 IMPRESSION: No acute cardiopulmonary process. Aortic Atherosclerosis (ICD10-170.0) Electronically Signed   By: Jacqulynn Cadet M.D.   On: 06/30/2018 19:46   Ct Head Wo Contrast  Result Date: 06/30/2018 CLINICAL DATA:  77 year old female with a history of weakness EXAM: CT HEAD WITHOUT CONTRAST TECHNIQUE: Contiguous axial images were obtained from the base of the skull through the vertex without intravenous contrast. COMPARISON:  CT 02/12/2018 FINDINGS: Brain: No acute intracranial hemorrhage. No midline shift or mass effect. Gray-white differentiation relatively maintained. Senescent calcifications in the basal ganglia. Patchy hypodensity in the periventricular white matter. Unremarkable configuration of the ventricles. Vascular: Calcifications of the intracranial vasculature. Skull: No acute displaced fracture. Sinuses/Orbits: Unremarkable appearance of the orbits. No significant paranasal sinus disease Other: None IMPRESSION: Negative for acute intracranial abnormality. Evidence of chronic microvascular ischemic disease Electronically  Signed   By: Corrie Mckusick D.O.   On: 06/30/2018 19:10   Dg Esophagus  Result Date: 07/01/2018 CLINICAL DATA:  Dysphagia EXAM: ESOPHOGRAM / BARIUM SWALLOW / BARIUM TABLET STUDY TECHNIQUE: Combined double contrast and single contrast examination performed using effervescent crystals, thick barium liquid, and thin barium liquid. The patient was observed with fluoroscopy swallowing a 13 mm barium sulphate tablet. FLUOROSCOPY TIME:  Fluoroscopy Time:  0.9 minute Radiation Exposure Index (if provided by the fluoroscopic device): 7.3 mGy Number of Acquired Spot Images: 0 COMPARISON:  None. FINDINGS: There was normal pharyngeal anatomy and motility. Contrast flowed freely through the esophagus without evidence of stricture or mass. There was normal esophageal mucosa without evidence of irregularity or ulceration. Esophageal motility was normal. No evidence of reflux. No definite hiatal hernia was demonstrated. At the end of the examination a 13 mm barium tablet was administered which transited through the esophagus and esophagogastric junction without delay. IMPRESSION: No esophageal stricture.  Overall normal esophagus. Electronically Signed   By: Kathreen Devoid   On: 07/01/2018 15:07     Assessment/Plan: 77 year old female presenting with multiple complaints including AMS, intermittent difficulty with speech, right leg weakness.  Symptoms do not necessarily occur together and it is unclear how long they have been present but do not appear to be acute.  Head CT reviewed and shows no acute changes.  Further work up  recommended.  Recommendations: 1.  EEG 2.  MRI of the brain is pending 3.  TSH, B12, ESR, cortisol   Alexis Goodell, MD Neurology [phone] 07/01/2018, 3:52 PM

## 2018-07-01 NOTE — Evaluation (Addendum)
Clinical/Bedside Swallow Evaluation Patient Details  Name: Erin Sutton MRN: [id number] Date of Birth: 08-05-1941  Today's Date: 07/01/2018 Time: SLP Start Time (ACUTE ONLY): 1400 SLP Stop Time (ACUTE ONLY): 1445 SLP Time Calculation (min) (ACUTE ONLY): 45 min  Past Medical History:  Past Medical History:  Diagnosis Date  . Anxiety   . Arthritis   . Chronic kidney disease   . Constipation   . GERD (gastroesophageal reflux disease)   . Hypertension   . Hypothyroidism   . Osteoporosis   . Sleep apnea   . Urinary incontinence    Past Surgical History:  Past Surgical History:  Procedure Laterality Date  . BREAST BIOPSY Right    neg/stereo  . BREAST BIOPSY Left    neg/stereo  . BREAST EXCISIONAL BIOPSY Right    neg  . COLONOSCOPY WITH PROPOFOL N/A 07/02/2015   Procedure: COLONOSCOPY WITH PROPOFOL;  Surgeon: Manya Silvas, MD;  Location: [facility] ENDOSCOPY;  Service: Endoscopy;  Laterality: N/A;  . CORRECTION HAMMER TOE Right   . ESOPHAGOGASTRODUODENOSCOPY (EGD) WITH PROPOFOL N/A 07/02/2015   Procedure: ESOPHAGOGASTRODUODENOSCOPY (EGD) WITH PROPOFOL;  Surgeon: Manya Silvas, MD;  Location: [facility] ENDOSCOPY;  Service: Endoscopy;  Laterality: N/A;  . JOINT REPLACEMENT Left    knee  . REPLACEMENT TOTAL KNEE Left   . SKIN BIOPSY Right    HPI:  Pt is a 77 y.o. female with a past medical history of anxiety, hypothyroidism, sleep apnea, CKD, Gastric Reflux, hypertension, hypothyroidism, presents to the emergency department for altered mental status.  According to the patient since this morning she has been very confused, friend states she saw the patient around 3 PM the patient did not recall her name, was very confused.  Patient states earlier today she was experiencing pressure in her chest and shortness of breath but those have since resolved. Pt does have significant baseline of REFLUX per pt. On PPI. Pt presents as easily distracted and talking/communicating while eyes closed at times.  Verbalizations are clear but sometimes mumbled. She appears easily distracted and focuses on her coughing/throat clearing and expectorating saliva. Pt has NO recent h/o pneumonia or respiratory infections per MD, notes.   Assessment / Plan / Recommendation Clinical Impression  Pt appears to present w/ fairly adequate oropharyngeal phase swallow function w/ reduced risk for aspiration from an oropharyngeal phase standpoint when following general aspiration precautions. However, pt does appear to have a baseline of moderate REFLUX and c/o significant Esophageal dysmotility (pointing to her mid sternum area) and stating "the food gets stuck here" and "then I start coughing and spitting phlegm".  She required min cues to remain focused to task - easily distracted. Pt consumed trials of ice chips, thin liquids via cup/straw, and purees/softened solids w/ no IMMEDIATE, overt s/s of aspiration noted; no decline in vocal quality or respiratory status during trials. It was noted that pt did have intermittent throat clearing and expectorating of phlegm/saliva post trials intermittently - she appeared to make herself clear the saliva/phlegm w/ the HARSH throat clearing. No increase or consistency in the throat clearing occurred as the trials continued. Oral phase was [facility] for bolus management of the trials given; timely A-P transit and oral clearing noted b/t trials. Of note; MODERATE Belching noted post po trials. OM exam appeared [facility] for lingual/labial movements, sensitive Gag+, and bolus management/clearing post swallowing. Pt fed self and prefers to use straws when drinking. She appeared min distracted at times; speech clear but she mumbled intermittently and talked w/ her  eyes closed(Cognitive issues?). Recommend a Dysphagia level 3(MINCED meats, moistened) w/ Thin liquids; general aspiration precautions; Pills Whole in Puree for safer swallowing. REFLUX PRECAUTIONS. MD/NSG updated.  SLP Visit Diagnosis: Dysphagia,  pharyngoesophageal phase (R13.14);Dysphagia, unspecified (R13.10)(Esophageal phase dysmotility)    Aspiration Risk  Mild aspiration risk(moreso from an Esophageal phase )    Diet Recommendation  Dysphagia level 3(MINCED meats) w/ Thin liquids; aspiration precautions; STRICT REFLUX precautions  Medication Administration: Whole meds with puree(for safer swallowing)    Other  Recommendations Recommended Consults: Consider GI evaluation;Consider esophageal assessment(Barium study ordered) Oral Care Recommendations: Oral care BID;Staff/trained caregiver to provide oral care Other Recommendations: (n/a)   Follow up Recommendations None(TBD)      Frequency and Duration (TBD)  (TBD)       Prognosis Prognosis for Safe Diet Advancement: Fair(-Good) Barriers to Reach Goals: Severity of deficits(Esophageal deficits at baseline)      Swallow Study   General Date of Onset: 06/30/18 HPI: Pt is a 77 y.o. female with a past medical history of anxiety, hypothyroidism, sleep apnea, CKD, Gastric Reflux, hypertension, hypothyroidism, presents to the emergency department for altered mental status.  According to the patient since this morning she has been very confused, friend states she saw the patient around 3 PM the patient did not recall her name, was very confused.  Patient states earlier today she was experiencing pressure in her chest and shortness of breath but those have since resolved. Pt does have significant baseline of REFLUX per pt. On PPI. Pt presents as easily distracted and talking/communicating while eyes closed at times. Verbalizations are clear but sometimes mumbled. She appears easily distracted and focuses on her coughing/throat clearing and expectorating saliva.  Type of Study: Bedside Swallow Evaluation Previous Swallow Assessment: none - though pt had gone to PCP w/ her c/o coughing issues and a GI Barium study had been ordered(but not performed yet). Diet Prior to this Study:  NPO(but regular diet at home - less meats) Temperature Spikes Noted: No(wbc 7.6) Respiratory Status: Room air History of Recent Intubation: No Behavior/Cognition: Alert;Cooperative;Pleasant mood;Distractible;Requires cueing Oral Cavity Assessment: Within Functional Limits;Dry(min) Oral Care Completed by SLP: Recent completion by staff Oral Cavity - Dentition: Adequate natural dentition Vision: Functional for self-feeding Self-Feeding Abilities: Able to feed self;Needs set up Patient Positioning: Upright in chair Baseline Vocal Quality: Normal Volitional Cough: Strong(constant throat clearing and spitting of saliva) Volitional Swallow: Able to elicit    Oral/Motor/Sensory Function Overall Oral Motor/Sensory Function: Within functional limits   Ice Chips Ice chips: Within functional limits Presentation: Spoon(fed; 3 trials) Other Comments: throat clear as at baseline x1   Thin Liquid Thin Liquid: Within functional limits Presentation: Cup;Self Fed;Straw(2 trials via cup; 5 trials via straw) Other Comments: throat clear as at baseline x1 post time after trials    Nectar Thick Nectar Thick Liquid: Not tested   Honey Thick Honey Thick Liquid: Not tested   Puree Puree: Within functional limits Presentation: Self Fed;Spoon(5 trials)   Solid     Solid: Within functional limits(mech soft foods) Presentation: Self Fed;Spoon(3 trials) Other Comments: moistened the graham crackers; throat clearing x1 post trials       Orinda Kenner, MS, CCC-SLP Watson,Katherine 07/01/2018,3:05 PM

## 2018-07-01 NOTE — Psychosocial Assessment (Signed)
RN reported that patient had 4 beat runs VTach and patient was asymptomatic. Patient alert oriented, n/o pain at this time. Will continue to monitor patient.

## 2018-07-01 NOTE — Progress Notes (Signed)
Initial Nutrition Assessment  DOCUMENTATION CODES:   Obesity unspecified  INTERVENTION:   Ensure Enlive po BID, each supplement provides 350 kcal and 20 grams of protein  MVI daily  NUTRITION DIAGNOSIS:   Swallowing difficulty related to dysphagia as evidenced by per patient/family report.  GOAL:   Patient will meet greater than or equal to 90% of their needs  MONITOR:   PO intake, Supplement acceptance, Labs, Weight trends, Skin, I & O's  REASON FOR ASSESSMENT:   Malnutrition Screening Tool    ASSESSMENT:   76 y.o. female with a past medical history of anxiety, CKD, gastric reflux, hypertension, hypothyroidism, presents to the emergency department for altered mental status.   Unable to see pt today; pt having barium study at time of RD visit. 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 Pt reports today food getting stuck in her chest and choking on "my own saliva". SLP evaluation today; recommending dysphagia 3 with minced meats and thin liquids. Per chart, pt is weight stable. RD will order supplements and MVI to help pt meet her estimated needs. RD will obtain nutrition related history and exam at follow up.   Medications reviewed and include: aspirin, lovenox, synthroid, protonix, zofran  Labs reviewed:   Unable to complete Nutrition-Focused physical exam at this time.   Diet Order:   Diet Order            Diet NPO time specified  Diet effective now             EDUCATION NEEDS:   No education needs have been identified at this time  Skin:  Skin Assessment: Reviewed RN Assessment(ecchymosis )  Last BM:  8/7  Height:   Ht Readings from Last 1 Encounters:  06/30/18 5\' 3"  (1.6 m)    Weight:   Wt Readings from Last 1 Encounters:  06/30/18 79.4 kg    Ideal Body Weight:  52.3 kg  BMI:  Body mass index is 31 kg/m.  Estimated Nutritional Needs:   Kcal:  1500-1800kcal/day    Protein:  64-80g/day   Fluid:  >1.5L/day   Koleen Distance MS, RD, LDN Pager #- [phone] Office#- [phone] After Hours Pager: [phone]

## 2018-07-01 NOTE — Progress Notes (Signed)
Patient stated that she feels cold  And tingling in her right leg, but her foot is warm to touch. Patient speech is garble not at what it was this morning. MD evaluated patient, neurologist awaiting for evaluation, speech therapist is assessing patient at this time. Patient BP is 145/65 after 10 mg of hydralazine given. Will continue to monitor patient.

## 2018-07-01 NOTE — Progress Notes (Signed)
Physical Therapy Evaluation Patient Details Name: Erin Sutton MRN: [id number] DOB: 05/28/1941 Today's Date: 07/01/2018   History of Present Illness  Erin Sutton  is a 77 y.o. female who presents with weakness, confusion, and speech difficulty.  Patient states that she took her blood pressure and that it was low.  She called a friend because of that, and her friend states that she was speaking very confused over the phone.  Her friend went over to see the patient and the patient was lethargic and confused still, so she called patient's primary care doctor who recommended they call EMS.  EMS arrived and found her blood pressure was significantly elevated, with systolic in the 326Z.  They checked this compared to her pressure device and found that her device was faulty, reading a low pressure erroneously.  She was brought to the ED, and here her blood pressure corrected after a brief period of time.  After her blood pressure came down she became more alert, less confused, and returned very close to what is her baseline mental functioning.  Initial work-up is largely within normal limits.  Hospitalist called for admission and further evaluation.  Clinical Impression  Pt admitted with above diagnosis. Pt currently with functional limitations due to the deficits listed below (see PT Problem List).  Pt demonstrates safe hand placement and good sequencing with transfers. She is able to complete a full lap around RN station. Mild lateral veering and instability noted but pt able to correct her balance with use of walker. HR remains around 100 bpm and SaO2>95%. Pt denies DOE with ambulation.  She is able to maintain wide stance without UE support. Pt loses balance when placing feet together. Unable to maintain narrow stance without UE support. She is a high risk for recurrent falls. She would benefit from [facility] PT to work on strength and balance. Recommend use of rolling walker at discharge. Pt will benefit from PT  services to address deficits in strength, balance, and mobility in order to return to full function at home.     Follow Up Recommendations Home health PT    Equipment Recommendations  Rolling walker with 5" wheels    Recommendations for Other Services       Precautions / Restrictions Precautions Precautions: Fall Restrictions Weight Bearing Restrictions: No      Mobility  Bed Mobility               General bed mobility comments: Pt received and left upright in recliner  Transfers Overall transfer level: Modified independent Equipment used: Rolling walker (2 wheeled)             General transfer comment: Pt demonstrates safe hand placement and good sequencing with transfers  Ambulation/Gait Ambulation/Gait assistance: Min guard Gait Distance (Feet): 200 Feet Assistive device: Rolling walker (2 wheeled)       General Gait Details: Pt able to complete a full lap around RN station. Mild lateral veering and instability noted but pt able to correct her balance with use of walker. HR remains around 100 bpm and SaO2>95%. Pt denies DOE with ambulation  Stairs            Wheelchair Mobility    Modified Rankin (Stroke Patients Only)       Balance Overall balance assessment: Needs assistance Sitting-balance support: No upper extremity supported Sitting balance-Leahy Scale: Good     Standing balance support: No upper extremity supported Standing balance-Leahy Scale: Fair Standing balance comment: Able to maintain wide  stance without UE support. Pt loses balance when placing feet together. Unable to maintain narrow stance without UE suport                             Pertinent Vitals/Pain Pain Assessment: No/denies pain    Home Living Family/patient expects to be discharged to:: Private residence Living Arrangements: Alone Available Help at Discharge: Friend(s) Type of Home: Mobile home Home Access: Stairs to enter Entrance  Stairs-Rails: Right Entrance Stairs-Number of Steps: 3 Home Layout: One level Home Equipment: Cane - single point      Prior Function Level of Independence: Independent         Comments: Pt uses spc for ambulation. 1 fall in the last 12 months. Pt is independent with ADLs     Hand Dominance   Dominant Hand: Right    Extremity/Trunk Assessment   Upper Extremity Assessment Upper Extremity Assessment: RUE deficits/detail RUE Deficits / Details: BUE strength grossly WFL. Finger to nose is dysmetric bilaterally but worse on R, thumb to finger opposition also abnormal for coordination bilaterally but worse on the right    Lower Extremity Assessment Lower Extremity Assessment: RLE deficits/detail RLE Deficits / Details: BLE strength grossly WFL. Pt reports R toe numbness for approximately the last 12 months. Denies history of pain pain. Heel to shin is mildly abnormal bilaterally       Communication   Communication: No difficulties  Cognition Arousal/Alertness: Awake/alert Behavior During Therapy: WFL for tasks assessed/performed Overall Cognitive Status: Within Functional Limits for tasks assessed                                 General Comments: AOx4      General Comments      Exercises     Assessment/Plan    PT Assessment Patient needs continued PT services  PT Problem List Decreased strength;Decreased activity tolerance;Decreased balance       PT Treatment Interventions DME instruction;Gait training;Stair training;Functional mobility training;Therapeutic activities;Therapeutic exercise;Balance training;Neuromuscular re-education    PT Goals (Current goals can be found in the Care Plan section)  Acute Rehab PT Goals Patient Stated Goal: Return to prior function PT Goal Formulation: With patient Time For Goal Achievement: 07/15/18 Potential to Achieve Goals: Good    Frequency Min 2X/week   Barriers to discharge Decreased caregiver  support Lives alone, has friends nearby    Co-evaluation               AM-PAC PT "6 Clicks" Daily Activity  Outcome Measure Difficulty turning over in bed (including adjusting bedclothes, sheets and blankets)?: None Difficulty moving from lying on back to sitting on the side of the bed? : None Difficulty sitting down on and standing up from a chair with arms (e.g., wheelchair, bedside commode, etc,.)?: None Help needed moving to and from a bed to chair (including a wheelchair)?: None Help needed walking in hospital room?: A Little Help needed climbing 3-5 steps with a railing? : A Little 6 Click Score: 22    End of Session Equipment Utilized During Treatment: Gait belt Activity Tolerance: Patient tolerated treatment well Patient left: in chair;with call bell/phone within reach;with family/visitor present   PT Visit Diagnosis: Unsteadiness on feet (R26.81);Muscle weakness (generalized) (M62.81);History of falling (Z91.81)    Time: 1633-1700 PT Time Calculation (min) (ACUTE ONLY): 27 min   Charges:   PT Evaluation $PT Eval  Low Complexity: 1 Low PT Treatments $Gait Training: 8-22 mins        Lyndel Safe Huprich PT, DPT, GCS   Huprich,Jason 07/01/2018, 5:19 PM

## 2018-07-01 NOTE — Progress Notes (Signed)
[facility] [place] NAME: Erin Sutton    MR#:  [id number]  DATE OF BIRTH:  [birth year]  SUBJECTIVE:  CHIEF COMPLAINT:   Chief Complaint  Patient presents with  . Weakness   Came with confusion and speech difficulty. As per friends, have swallowing difficulties for last 4-5 months. Some tingling in right foot. REVIEW OF SYSTEMS:  CONSTITUTIONAL: No fever, fatigue or weakness.  EYES: No blurred or double vision.  EARS, NOSE, AND THROAT: No tinnitus or ear pain.  RESPIRATORY: No cough, shortness of breath, wheezing or hemoptysis.  CARDIOVASCULAR: No chest pain, orthopnea, edema.  GASTROINTESTINAL: No nausea, vomiting, diarrhea or abdominal pain.  GENITOURINARY: No dysuria, hematuria.  ENDOCRINE: No polyuria, nocturia,  HEMATOLOGY: No anemia, easy bruising or bleeding SKIN: No rash or lesion. MUSCULOSKELETAL: No joint pain or arthritis.   NEUROLOGIC: No tingling, numbness, weakness.  PSYCHIATRY: No anxiety or depression.   ROS  DRUG ALLERGIES:   Allergies  Allergen Reactions  . Ace Inhibitors Cough  . Amoxicillin Rash    Has patient had a PCN reaction causing immediate rash, facial/tongue/throat swelling, SOB or lightheadedness with hypotension: No Has patient had a PCN reaction causing severe rash involving mucus membranes or skin necrosis: No Has patient had a PCN reaction that required hospitalization: No Has patient had a PCN reaction occurring within the last 10 years: No If all of the above answers are "NO", then may proceed with Cephalosporin use.   Marland Kitchen Benicar [Olmesartan] Rash    Itching rash  . Codeine Sulfate Rash  . Levaquin [Levofloxacin In D5w] Rash    Alters mental status  . Penicillin V Potassium Rash    Has patient had a PCN reaction causing immediate rash, facial/tongue/throat swelling, SOB or lightheadedness with hypotension: No Has patient had a PCN reaction causing severe rash involving mucus membranes or  skin necrosis: No Has patient had a PCN reaction that required hospitalization: No Has patient had a PCN reaction occurring within the last 10 years: No If all of the above answers are "NO", then may proceed with Cephalosporin use.   . Sulfa Antibiotics Rash    VITALS:  Blood pressure (!) 145/65, pulse 93, temperature 98.8 F (37.1 C), temperature source Oral, resp. rate 18, height 5\' 3"  (1.6 m), weight 79.4 kg, SpO2 99 %.  PHYSICAL EXAMINATION:  GENERAL:  77 y.o.-year-old patient lying in the bed with no acute distress.  While I was in the room, patient was constantly trying to clear her throat as if something stuck in her throat. EYES: Pupils equal, round, reactive to light and accommodation. No scleral icterus. Extraocular muscles intact.  HEENT: Head atraumatic, normocephalic. Oropharynx and nasopharynx clear.  NECK:  Supple, no jugular venous distention. No thyroid enlargement, no tenderness.  LUNGS: Normal breath sounds bilaterally, no wheezing, rales,rhonchi or crepitation. No use of accessory muscles of respiration.  CARDIOVASCULAR: S1, S2 normal. No murmurs, rubs, or gallops.  ABDOMEN: Soft, nontender, nondistended. Bowel sounds present. No organomegaly or mass.  EXTREMITIES: No pedal edema, cyanosis, or clubbing.  NEUROLOGIC: Cranial nerves II through XII are intact. Muscle strength 4/5 in all extremities. Sensation intact. Gait not checked.  PSYCHIATRIC: The patient is alert and oriented x 3.  SKIN: No obvious rash, lesion, or ulcer.   Physical Exam LABORATORY PANEL:   CBC Recent Labs  Lab 07/01/18 0506  WBC 7.6  HGB 11.5*  HCT 34.4*  PLT 308   ------------------------------------------------------------------------------------------------------------------  Chemistries  Recent Labs  Lab 06/30/18 1806 07/01/18 0506  NA 142 142  K 3.7 3.6  CL 105 104  CO2 26 27  GLUCOSE 112* 107*  BUN 14 11  CREATININE 0.81 0.81  CALCIUM 10.4* 9.8  AST 31  --   ALT 12   --   ALKPHOS 63  --   BILITOT 0.6  --    ------------------------------------------------------------------------------------------------------------------  Cardiac Enzymes Recent Labs  Lab 06/30/18 1806  TROPONINI <0.03   ------------------------------------------------------------------------------------------------------------------  RADIOLOGY:  Dg Chest 2 View  Result Date: 06/30/2018 CLINICAL DATA:  77 year old female with weakness and cough EXAM: CHEST - 2 VIEW COMPARISON:  Prior chest x-ray 12/18/2017 FINDINGS: Cardiac and mediastinal contours are within normal limits. Atherosclerotic calcifications are present in the transverse aorta. No focal airspace consolidation, pleural effusion or pneumothorax. No evidence of pulmonary edema or suspicious nodule or mass. Minimal chronic bronchitic changes. Remote healed right proximal humerus fracture. 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 IMPRESSION: No acute cardiopulmonary process. Aortic Atherosclerosis (ICD10-170.0) Electronically Signed   By: Jacqulynn Cadet M.D.   On: 06/30/2018 19:46   Ct Head Wo Contrast  Result Date: 06/30/2018 CLINICAL DATA:  77 year old female with a history of weakness EXAM: CT HEAD WITHOUT CONTRAST TECHNIQUE: Contiguous axial images were obtained from the base of the skull through the vertex without intravenous contrast. COMPARISON:  CT 02/12/2018 FINDINGS: Brain: No acute intracranial hemorrhage. No midline shift or mass effect. Gray-white differentiation relatively maintained. Senescent calcifications in the basal ganglia. Patchy hypodensity in the periventricular white matter. Unremarkable configuration of the ventricles. Vascular: Calcifications of the intracranial vasculature. Skull: No acute displaced fracture. Sinuses/Orbits: Unremarkable appearance of the orbits. No significant paranasal sinus disease Other: None IMPRESSION: Negative for acute intracranial abnormality. Evidence of chronic  microvascular ischemic disease Electronically Signed   By: Corrie Mckusick D.O.   On: 06/30/2018 19:10   Dg Esophagus  Result Date: 07/01/2018 CLINICAL DATA:  Dysphagia EXAM: ESOPHOGRAM / BARIUM SWALLOW / BARIUM TABLET STUDY TECHNIQUE: Combined double contrast and single contrast examination performed using effervescent crystals, thick barium liquid, and thin barium liquid. The patient was observed with fluoroscopy swallowing a 13 mm barium sulphate tablet. FLUOROSCOPY TIME:  Fluoroscopy Time:  0.9 minute Radiation Exposure Index (if provided by the fluoroscopic device): 7.3 mGy Number of Acquired Spot Images: 0 COMPARISON:  None. FINDINGS: There was normal pharyngeal anatomy and motility. Contrast flowed freely through the esophagus without evidence of stricture or mass. There was normal esophageal mucosa without evidence of irregularity or ulceration. Esophageal motility was normal. No evidence of reflux. No definite hiatal hernia was demonstrated. At the end of the examination a 13 mm barium tablet was administered which transited through the esophagus and esophagogastric junction without delay. IMPRESSION: No esophageal stricture.  Overall normal esophagus. Electronically Signed   By: Kathreen Devoid   On: 07/01/2018 15:07    ASSESSMENT AND PLAN:   Principal Problem:   Accelerated hypertension Active Problems:   Confusion   Hypothyroidism   HTN (hypertension)   GERD (gastroesophageal reflux disease)   Anxiety  * Accelerated hypertension - Due to missing oral tablets.,  Give IV hydralazine as needed. Once swallow evaluation is done, we can restart oral medication.   * TIA? Confusion -questionable whether hypertensive encephalopathy versus TIA or stroke.   She also has some tingling in her right foot, I will do MRI brain and call neurology consult.  *Dysphagia For like 5 to 6 months, she is complaining of difficulty swallowing food especially soft and sticky  food, and she has constant coughing  or trying to clear her throat. Will do barium esophageal study. Swallow evaluation.  *  HTN (hypertension) -continue home meds with blood pressure goal as above *  Hypothyroidism -home dose thyroid replacement *  GERD (gastroesophageal reflux disease) -Home dose PPI *  Anxiety -home dose anxiolytic  All the records are reviewed and case discussed with Care Management/Social Workerr. Management plans discussed with the patient, family and they are in agreement.  CODE STATUS: Full.  TOTAL TIME TAKING CARE OF THIS PATIENT: 35 minutes.     POSSIBLE D/C IN 1-2 DAYS, DEPENDING ON CLINICAL CONDITION.   Vaughan Basta M.D on 07/01/2018   Between 7am to 6pm - Pager - [phone]  After 6pm go to [email or web address] - password [email or web address] [facility]  Office  [phone]  CC: Primary care physician; Baxter Hire, MD  Note: This dictation was prepared with Dragon dictation along with smaller phrase technology. Any transcriptional errors that result from this process are unintentional.

## 2018-07-01 NOTE — Care Management Obs Status (Addendum)
[facility] NOTIFICATION   Patient Details  Name: Erin Sutton MRN: [id number] Date of Birth: 1941-09-19   Medicare Observation Status Notification Given:   yes Permission to x    Shelbie Ammons, RN 07/01/2018, 8:21 AM

## 2018-07-01 NOTE — Plan of Care (Signed)
  Problem: Education: Goal: Knowledge of General Education information will improve Description Including pain rating scale, medication(s)/side effects and non-pharmacologic comfort measures Outcome: Progressing   Problem: Clinical Measurements: Goal: Ability to maintain clinical measurements within normal limits will improve Outcome: Progressing Goal: Will remain free from infection Outcome: Progressing   Problem: Nutrition: Goal: Adequate nutrition will be maintained Outcome: Progressing   Problem: Safety: Goal: Ability to remain free from injury will improve Outcome: Progressing

## 2018-07-01 NOTE — Progress Notes (Signed)
*  PRELIMINARY RESULTS* Echocardiogram 2D Echocardiogram has been performed.  Sherrie Sport 07/01/2018, 9:58 AM

## 2018-07-01 NOTE — Care Management Note (Signed)
Case Management Note  Patient Details  Name: CHELSA STOUT MRN: [id number] Date of Birth: 11-Jul-1941  Subjective/Objective:     Admitted to [facility] under observation status with the diagnosis of accelerated hypertension. Lives alone. Friend is [employer]. Last seen Dr. Harrel Lemon Monday. Prescriptions are filled at [facility] and [facility] in [place] and [place] in the past.  [place] and [place] in the past.  Outpatient physical therapy in the past. Raised toilet seat, grabbars, cane and rales in the home. Last fall was February 12 2018. Decreased appetite. Lost 20 pounds in the last year. Takes care of all basic activities of daily living herself, drives. Friend will transport.            Action/Plan: Will continue to follow for plans   Expected Discharge Date:                  Expected Discharge Plan:     In-House Referral:   yes  Discharge planning Services     Post Acute Care Choice:    Choice offered to:     DME Arranged:    DME Agency:     HH Arranged:    HH Agency:     Status of Service:     If discussed at [employer] of Stay Meetings, dates discussed:    Additional Comments:  Shelbie Ammons, RN MSN CCM Care Management [phone] 07/01/2018, 8:33 AM

## 2018-07-02 ENCOUNTER — Observation Stay: Payer: Medicare PPO

## 2018-07-02 DIAGNOSIS — R410 Disorientation, unspecified: Secondary | ICD-10-CM | POA: Diagnosis not present

## 2018-07-02 LAB — SEDIMENTATION RATE: Sed Rate: 52 mm/hr — ABNORMAL HIGH (ref 0–30)

## 2018-07-02 LAB — CORTISOL: Cortisol, Plasma: 11.1 ug/dL

## 2018-07-02 LAB — TSH: TSH: 3.098 u[IU]/mL (ref 0.350–4.500)

## 2018-07-02 LAB — VITAMIN B12: Vitamin B-12: 659 pg/mL (ref 180–914)

## 2018-07-02 MED ORDER — CARVEDILOL 12.5 MG PO TABS: 12.5000 mg | ORAL_TABLET | Freq: Two times a day (BID) | ORAL | 0 refills | Status: DC

## 2018-07-02 MED ORDER — CARVEDILOL 3.125 MG PO TABS: 12.5000 mg | ORAL_TABLET | Freq: Two times a day (BID) | ORAL | 0 refills | Status: DC

## 2018-07-02 MED ORDER — LORAZEPAM 2 MG/ML IJ SOLN
1.0000 mg | Freq: Once | INTRAMUSCULAR | Status: AC
  Administered 2018-07-02: 11:00:00 1 mg via INTRAVENOUS
  Filled 2018-07-02: qty 1

## 2018-07-02 NOTE — Progress Notes (Signed)
eeg completed ° °

## 2018-07-02 NOTE — Procedures (Signed)
ELECTROENCEPHALOGRAM REPORT   Patient: Erin Sutton       Room #: 102A-AA EEG No. ID: [id number] Age: 77 y.o.        Sex: female Referring Physician: Anselm Jungling Report Date:  07/02/2018        Interpreting Physician: Alexis Goodell  History: [name] is an 77 y.o. female with altered mental status  Medications:  Norvasc, ASA, Coreg, Imdur, Synthroid, Protonix, Pravachol, Zoloft  Conditions of Recording:  This is a 21 channel routine scalp EEG performed with bipolar and monopolar montages arranged in accordance to the international 10/20 system of electrode placement. One channel was dedicated to EKG recording.  The patient is in the awake, drowsy and asleep states.  Description:  The waking background activity consists of a low voltage, symmetrical, fairly well organized, 10 Hz alpha activity, seen from the parieto-occipital and posterior temporal regions.  Low voltage fast activity, poorly organized, is seen anteriorly and is at times superimposed on more posterior regions.  A mixture of theta and alpha rhythms are seen from the central and temporal regions. The patient drowses with slowing to irregular, low voltage theta and beta activity.   The patient goes in to a light sleep with symmetrical sleep spindles, vertex central sharp transients and irregular slow activity.   No epileptiform activity is noted.   Hyperventilation was not performed.  Intermittent photic stimulation was performed but failed to illicit any change in the tracing.    IMPRESSION: Normal electroencephalogram, awake, asleep and with activation procedures. There are no focal lateralizing or epileptiform features.   Alexis Goodell, MD Neurology [phone] 07/02/2018, 3:33 PM

## 2018-07-02 NOTE — Progress Notes (Signed)
Patient is back from MRI and EEG. Patient in bed resting comfortably. Will continue to monitor patient.

## 2018-07-02 NOTE — Progress Notes (Signed)
[facility]  Next of kin: Lou Miner [phone]   If patient discharges after hours, please call [phone].   Florene Glen 07/02/2018, 4:14 PM

## 2018-07-02 NOTE — Progress Notes (Signed)
[facility] [facility] at [place] NAME: Erin Sutton    MR#:  [id number]  DATE OF BIRTH:  1941-09-14  SUBJECTIVE:  CHIEF COMPLAINT:   Chief Complaint  Patient presents with  . Weakness   Came with confusion and speech difficulty. As per friends, have swallowing difficulties for last 4-5 months. Some tingling in right foot. Feels better today.  REVIEW OF SYSTEMS:  CONSTITUTIONAL: No fever, fatigue or weakness.  EYES: No blurred or double vision.  EARS, NOSE, AND THROAT: No tinnitus or ear pain.  RESPIRATORY: No cough, shortness of breath, wheezing or hemoptysis.  CARDIOVASCULAR: No chest pain, orthopnea, edema.  GASTROINTESTINAL: No nausea, vomiting, diarrhea or abdominal pain.  GENITOURINARY: No dysuria, hematuria.  ENDOCRINE: No polyuria, nocturia,  HEMATOLOGY: No anemia, easy bruising or bleeding SKIN: No rash or lesion. MUSCULOSKELETAL: No joint pain or arthritis.   NEUROLOGIC: No tingling, numbness, weakness.  PSYCHIATRY: No anxiety or depression.   ROS  DRUG ALLERGIES:   Allergies  Allergen Reactions  . Ace Inhibitors Cough  . Amoxicillin Rash    Has patient had a PCN reaction causing immediate rash, facial/tongue/throat swelling, SOB or lightheadedness with hypotension: No Has patient had a PCN reaction causing severe rash involving mucus membranes or skin necrosis: No Has patient had a PCN reaction that required hospitalization: No Has patient had a PCN reaction occurring within the last 10 years: No If all of the above answers are "NO", then may proceed with Cephalosporin use.   Marland Kitchen Benicar [Olmesartan] Rash    Itching rash  . Codeine Sulfate Rash  . Levaquin [Levofloxacin In D5w] Rash    Alters mental status  . Penicillin V Potassium Rash    Has patient had a PCN reaction causing immediate rash, facial/tongue/throat swelling, SOB or lightheadedness with hypotension: No Has patient had a PCN reaction causing severe rash involving  mucus membranes or skin necrosis: No Has patient had a PCN reaction that required hospitalization: No Has patient had a PCN reaction occurring within the last 10 years: No If all of the above answers are "NO", then may proceed with Cephalosporin use.   . Sulfa Antibiotics Rash    VITALS:  Blood pressure (!) 169/63, pulse 75, temperature 98.5 F (36.9 C), temperature source Oral, resp. rate 15, height 5\' 3"  (1.6 m), weight 79.4 kg, SpO2 98 %.  PHYSICAL EXAMINATION:  GENERAL:  77 y.o.-year-old patient lying in the bed with no acute distress.  While I was in the room, patient was constantly trying to clear her throat as if something stuck in her throat. EYES: Pupils equal, round, reactive to light and accommodation. No scleral icterus. Extraocular muscles intact.  HEENT: Head atraumatic, normocephalic. Oropharynx and nasopharynx clear.  NECK:  Supple, no jugular venous distention. No thyroid enlargement, no tenderness.  LUNGS: Normal breath sounds bilaterally, no wheezing, rales,rhonchi or crepitation. No use of accessory muscles of respiration.  CARDIOVASCULAR: S1, S2 normal. No murmurs, rubs, or gallops.  ABDOMEN: Soft, nontender, nondistended. Bowel sounds present. No organomegaly or mass.  EXTREMITIES: No pedal edema, cyanosis, or clubbing. Both feet warm, pulses and sensation intact. NEUROLOGIC: Cranial nerves II through XII are intact. Muscle strength 4/5 in all extremities. Sensation intact. Gait not checked.  PSYCHIATRIC: The patient is alert and oriented x 3.  SKIN: No obvious rash, lesion, or ulcer.   Physical Exam LABORATORY PANEL:   CBC Recent Labs  Lab 07/01/18 0506  WBC 7.6  HGB 11.5*  HCT 34.4*  PLT  308   ------------------------------------------------------------------------------------------------------------------  Chemistries  Recent Labs  Lab 06/30/18 1806 07/01/18 0506  NA 142 142  K 3.7 3.6  CL 105 104  CO2 26 27  GLUCOSE 112* 107*  BUN 14 11   CREATININE 0.81 0.81  CALCIUM 10.4* 9.8  AST 31  --   ALT 12  --   ALKPHOS 63  --   BILITOT 0.6  --    ------------------------------------------------------------------------------------------------------------------  Cardiac Enzymes Recent Labs  Lab 06/30/18 1806  TROPONINI <0.03   ------------------------------------------------------------------------------------------------------------------  RADIOLOGY:  Mr Brain Wo Contrast  Result Date: 07/02/2018 CLINICAL DATA:  Speech difficulty. Abnormal speech associated with hypertension. Symptoms have improved after correction of blood pressure. EXAM: MRI HEAD WITHOUT CONTRAST TECHNIQUE: Multiplanar, multiecho pulse sequences of the brain and surrounding structures were obtained without intravenous contrast. COMPARISON:  CT head without contrast 06/30/2018.  Brain 05/15/2017. FINDINGS: Brain: No acute infarct, hemorrhage, or mass lesion is present. The ventricles are of normal size. No significant extraaxial fluid collection is present. Remote lacunar infarct is present in the left thalamus. There are remote lacunar infarcts of the left occipital lobe and medial left parietal lobe. Brainstem and cerebellum are otherwise normal. The internal auditory canals are within normal limits. Vascular: Flow is present in the major intracranial arteries. Skull and upper cervical spine: The skull base is normal. Craniocervical junction is normal. Marrow signal is within normal limits. Sinuses/Orbits: The paranasal sinuses and mastoid air cells are clear. Bilateral lens replacements are present. Globes and orbits are within normal limits otherwise. IMPRESSION: 1. No acute intracranial abnormality or significant interval change. 2. 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 Electronically Signed   By: San Morelle M.D.   On: 07/02/2018 14:54   Dg Esophagus  Result Date: 07/01/2018 CLINICAL DATA:   Dysphagia EXAM: ESOPHOGRAM / BARIUM SWALLOW / BARIUM TABLET STUDY TECHNIQUE: Combined double contrast and single contrast examination performed using effervescent crystals, thick barium liquid, and thin barium liquid. The patient was observed with fluoroscopy swallowing a 13 mm barium sulphate tablet. FLUOROSCOPY TIME:  Fluoroscopy Time:  0.9 minute Radiation Exposure Index (if provided by the fluoroscopic device): 7.3 mGy Number of Acquired Spot Images: 0 COMPARISON:  None. FINDINGS: There was normal pharyngeal anatomy and motility. Contrast flowed freely through the esophagus without evidence of stricture or mass. There was normal esophageal mucosa without evidence of irregularity or ulceration. Esophageal motility was normal. No evidence of reflux. No definite hiatal hernia was demonstrated. At the end of the examination a 13 mm barium tablet was administered which transited through the esophagus and esophagogastric junction without delay. IMPRESSION: No esophageal stricture.  Overall normal esophagus. Electronically Signed   By: Kathreen Devoid   On: 07/01/2018 15:07    ASSESSMENT AND PLAN:   Principal Problem:   Accelerated hypertension Active Problems:   Confusion   Hypothyroidism   HTN (hypertension)   GERD (gastroesophageal reflux disease)   Anxiety  * Accelerated hypertension - Due to missing oral tablets.,  Give IV hydralazine as needed. Once swallow evaluation is done, we can restart oral medication. BP better controlled now.   * TIA? Confusion -questionable whether hypertensive encephalopathy versus TIA or stroke.   She also has some tingling in her right foot, I will do MRI brain and call neurology consult. MRI and EEG negative. PT may have TIA or the confusion due to Uncontrolled Htn She is already on ASA, statin.  *Dysphagia For like 5 to 6  months, she is complaining of difficulty swallowing food especially soft and sticky food, and she has constant coughing or trying to clear  her throat. Normal barium esophageal study. Swallow evaluation dysphagia diet.  Follow with GI clinic. Advised to use losenges and ice chips for dry throat.  *  HTN (hypertension) -continue home meds with blood pressure goal as above *  Hypothyroidism -home dose thyroid replacement- TSH normal. *  GERD (gastroesophageal reflux disease) -Home dose PPI *  Anxiety -home dose anxiolytic  All the records are reviewed and case discussed with Care Management/Social Workerr. Management plans discussed with the patient, family and they are in agreement.  CODE STATUS: Full.  TOTAL TIME TAKING CARE OF THIS PATIENT: 75 minutes.    POSSIBLE D/C IN ? DAYS, DEPENDING ON CLINICAL CONDITION. She is ready for discharge today, if family can pick her up. Her friends and neighbours don't want to take her home. I had explained, we had done initial work ups, all looks negative, chances are- she may stay week for a while or this is just aging process. Arranging for HHA PT. She should follow with PMD and also consider the plans of assisted living place, if living alone is a worry for family.  Vaughan Basta M.D on 07/02/2018   Between 7am to 6pm - Pager - [phone]  After 6pm go to [email or web address] - password [email or web address] [facility]  Office  [phone]  CC: Primary care physician; Baxter Hire, MD  Note: This dictation was prepared with Dragon dictation along with smaller phrase technology. Any transcriptional errors that result from this process are unintentional.

## 2018-07-02 NOTE — Progress Notes (Signed)
Patient has been discharged but refused to go. Patient stated that she it is not safe for her to stay home by herself. Rn asked patient where would she like to go, patient stated that she does not know. She said her daughter will make that decision. MD paged about the situation. Will continue to monitor patient.

## 2018-07-02 NOTE — Discharge Instructions (Signed)
Soft to puree diet. Follow with PMD in 1 week. folow with Gi clinic in 1 week.

## 2018-07-02 NOTE — Progress Notes (Signed)
The plan is to discharge pt home today as her MRI brain and EEG are negative and she was able to walk safely with PT. Dietary recommnedations explained to pt.  Case manager had arranged for home health and PT. Advised to walk with walker.  Her frieds called from room, "having concerns about her going home alone and, they are not "authorized" to take her home." Gave me her daughter's number.  I called her daughter, she is on her way from [facility], will reach around 8 pm. Discussed with her the findings of the test and neurologists opinion. Also about dietary instructions and follow ups. Discussed about PT and walker. She understands the plan and seems to be ready to take her home. I had also briefly discussed about assisted living places ,if she have concern about aging mother.   Informed the nurse on floor, if there is someone available to take pt home tonight, she can go. Otherwise, we will keep her here and Dr.Chen will address that tomorrow.  Additional time spent 35 min.

## 2018-07-02 NOTE — Progress Notes (Signed)
  Speech Language Pathology Treatment: Dysphagia  Patient Details Name: Erin Sutton MRN: [id number] DOB: March 21, 1941 Today's Date: 07/02/2018 Time: 1500-1550 SLP Time Calculation (min) (ACUTE ONLY): 50 min  Assessment / Plan / Recommendation Clinical Impression  Due to nursing & MD report of patient strong dislike for current diet and request for diet upgrade (per patient), SLP followed up for possible diet upgrade. However, patient reports she did not request a diet upgrade but rather wanted different foods as the strong odor of a rich meal made her very nauseous last night to the point of feeling she might vomit.  She reported she was getting things on her tray that she didn't like such as sausages and eggs, foods that didn't agree with her because of her reflux. Patient tolerated trials of solid and sips of thin liquid via straw with no immediate s/s aspiration. Patient persists with very strong coughing and throat clears before PO intake and intermittently throughout treatment session; however did not appear to be related to small bites of solid or sips of thin liquid. Recommend continue with current Dysphagia III diet with thin liquids, no meats per patient request. Recommend strict aspiration precautions, alternate sips of thin liquid with small bites of food, sit fully upright for all PO intake and remain upright for up to 90 minutes following PO intake. SLP to follow up with toleration of diet. MD may consider possible MBSS in future with any future concerns re: swallowing to rule out pharyngoesophageal dysphagia.    HPI HPI: Pt is a 77 y.o. female with a past medical history of anxiety, hypothyroidism, sleep apnea, CKD, Gastric Reflux, hypertension, hypothyroidism, presents to the emergency department for altered mental status.  According to the patient since this morning she has been very confused, friend states she saw the patient around 3 PM the patient did not recall her name, was very  confused.  Patient states earlier today she was experiencing pressure in her chest and shortness of breath but those have since resolved. Pt does have significant baseline of REFLUX per pt. On PPI. Pt presents as easily distracted and talking/communicating while eyes closed at times. Verbalizations are clear but sometimes mumbled. She appears easily distracted and focuses on her coughing/throat clearing and expectorating saliva.       SLP Plan  Continue with current plan of care       Recommendations  Diet recommendations: Dysphagia 3 (mechanical soft);Thin liquid Medication Administration: Whole meds with puree Supervision: Patient able to self feed Compensations: Minimize environmental distractions;Slow rate;Small sips/bites                Oral Care Recommendations: Oral care BID;Staff/trained caregiver to provide oral care Follow up Recommendations: None SLP Visit Diagnosis: Dysphagia, pharyngoesophageal phase (R13.14);Dysphagia, unspecified (R13.10) Plan: Continue with current plan of care       GO                Erin Farias, MA, CCC-SLP 07/02/2018, 3:54 PM

## 2018-07-02 NOTE — Progress Notes (Signed)
Discharge instructions given to daughter and patient.. Escorted out via staff in wheelchair.

## 2018-07-17 NOTE — Discharge Summary (Signed)
[facility] [place] NAME: Erin Sutton    MR#:  [id number]  DATE OF BIRTH:  01-Jun-1941  DATE OF ADMISSION:  06/30/2018 ADMITTING PHYSICIAN: Lance Coon, MD  DATE OF DISCHARGE: 07/02/2018 10:05 PM  PRIMARY CARE PHYSICIAN: Baxter Hire, MD    ADMISSION DIAGNOSIS:  TIA (transient ischemic attack) [G45.9] Weakness [R53.1]  DISCHARGE DIAGNOSIS:  Principal Problem:   Accelerated hypertension Active Problems:   Confusion   Hypothyroidism   HTN (hypertension)   GERD (gastroesophageal reflux disease)   Anxiety   SECONDARY DIAGNOSIS:   Past Medical History:  Diagnosis Date  . Anxiety   . Arthritis   . Chronic kidney disease   . Constipation   . GERD (gastroesophageal reflux disease)   . Hypertension   . Hypothyroidism   . Osteoporosis   . Sleep apnea   . Urinary incontinence     HOSPITAL COURSE:   *Accelerated hypertension - Due to missing oral tablets.,  Give IV hydralazine as needed. Once swallow evaluation is done, we can restart oral medication. BP better controlled now.  *TIA? Confusion -questionable whether hypertensive encephalopathy versus TIA or stroke.  She also has some tingling in her right foot, MRI and EEG negative. Pt may have TIA or the confusion due to Uncontrolled Htn She is already on ASA, statin.  *Dysphagia For like 5 to 6 months, she is complaining of difficulty swallowing food especially soft and sticky food, and she has constant coughing or trying to clear her throat. Normal barium esophageal study. Swallow evaluation dysphagia diet.  Follow with GI clinic. Advised to use losenges and ice chips for dry throat.  *HTN (hypertension) -continue home meds with blood pressure goal as above *Hypothyroidism -home dose thyroid replacement- TSH normal. *GERD (gastroesophageal reflux disease) -Home dose PPI *Anxiety -home dose anxiolytic   DISCHARGE CONDITIONS:    Stable.  CONSULTS OBTAINED:  Treatment Team:  Alexis Goodell, MD  DRUG ALLERGIES:   Allergies  Allergen Reactions  . Ace Inhibitors Cough  . Amoxicillin Rash    Has patient had a PCN reaction causing immediate rash, facial/tongue/throat swelling, SOB or lightheadedness with hypotension: No Has patient had a PCN reaction causing severe rash involving mucus membranes or skin necrosis: No Has patient had a PCN reaction that required hospitalization: No Has patient had a PCN reaction occurring within the last 10 years: No If all of the above answers are "NO", then may proceed with Cephalosporin use.   Marland Kitchen Benicar [Olmesartan] Rash    Itching rash  . Codeine Sulfate Rash  . Levaquin [Levofloxacin In D5w] Rash    Alters mental status  . Penicillin V Potassium Rash    Has patient had a PCN reaction causing immediate rash, facial/tongue/throat swelling, SOB or lightheadedness with hypotension: No Has patient had a PCN reaction causing severe rash involving mucus membranes or skin necrosis: No Has patient had a PCN reaction that required hospitalization: No Has patient had a PCN reaction occurring within the last 10 years: No If all of the above answers are "NO", then may proceed with Cephalosporin use.   . Sulfa Antibiotics Rash    DISCHARGE MEDICATIONS:   Allergies as of 07/02/2018      Reactions   Ace Inhibitors Cough   Amoxicillin Rash   Has patient had a PCN reaction causing immediate rash, facial/tongue/throat swelling, SOB or lightheadedness with hypotension: No Has patient had a PCN reaction causing severe rash involving mucus membranes or skin necrosis:  No Has patient had a PCN reaction that required hospitalization: No Has patient had a PCN reaction occurring within the last 10 years: No If all of the above answers are "NO", then may proceed with Cephalosporin use.   Benicar [olmesartan] Rash   Itching rash   Codeine Sulfate Rash   Levaquin [levofloxacin In D5w] Rash    Alters mental status   Penicillin V Potassium Rash   Has patient had a PCN reaction causing immediate rash, facial/tongue/throat swelling, SOB or lightheadedness with hypotension: No Has patient had a PCN reaction causing severe rash involving mucus membranes or skin necrosis: No Has patient had a PCN reaction that required hospitalization: No Has patient had a PCN reaction occurring within the last 10 years: No If all of the above answers are "NO", then may proceed with Cephalosporin use.   Sulfa Antibiotics Rash      Medication List    TAKE these medications   amLODipine 10 MG tablet Commonly known as:  NORVASC Take 10 mg by mouth daily.   aspirin EC 81 MG tablet Take 81 mg by mouth daily.   carvedilol 3.125 MG tablet Commonly known as:  COREG Take 4 tablets (12.5 mg total) by mouth 2 (two) times daily with a meal.   isosorbide mononitrate 30 MG 24 hr tablet Commonly known as:  IMDUR Take 30 mg by mouth daily.   levothyroxine 50 MCG tablet Commonly known as:  SYNTHROID, LEVOTHROID Take 50 mcg by mouth daily before breakfast.   LORazepam 2 MG tablet Commonly known as:  ATIVAN Take 2 mg by mouth every 8 (eight) hours as needed for anxiety.   magnesium oxide 400 MG tablet Commonly known as:  MAG-OX Take 400 mg by mouth daily.   pantoprazole 40 MG tablet Commonly known as:  PROTONIX Take 40 mg by mouth daily.   potassium chloride SA 20 MEQ tablet Commonly known as:  K-DUR,KLOR-CON Take 20 mEq by mouth every morning.   pravastatin 40 MG tablet Commonly known as:  PRAVACHOL Take 40 mg by mouth at bedtime.   sertraline 50 MG tablet Commonly known as:  ZOLOFT Take 50 mg by mouth at bedtime.   SYSTANE OP Place 1 drop into both eyes 2 (two) times daily.   VITAMIN D PO Take 1 tablet by mouth daily.        DISCHARGE INSTRUCTIONS:    Follow with PMD.  If you experience worsening of your admission symptoms, develop shortness of breath, life threatening  emergency, suicidal or homicidal thoughts you must seek medical attention immediately by calling 911 or calling your MD immediately  if symptoms less severe.  You Must read complete instructions/literature along with all the possible adverse reactions/side effects for all the Medicines you take and that have been prescribed to you. Take any new Medicines after you have completely understood and accept all the possible adverse reactions/side effects.   Please note  You were cared for by a hospitalist during your hospital stay. If you have any questions about your discharge medications or the care you received while you were in the hospital after you are discharged, you can call the unit and asked to speak with the hospitalist on call if the hospitalist that took care of you is not available. Once you are discharged, your primary care physician will handle any further medical issues. Please note that NO REFILLS for any discharge medications will be authorized once you are discharged, as it is imperative that you return to your  primary care physician (or establish a relationship with a primary care physician if you do not have one) for your aftercare needs so that they can reassess your need for medications and monitor your lab values.    Today   CHIEF COMPLAINT:   Chief Complaint  Patient presents with  . Weakness    HISTORY OF PRESENT ILLNESS:  Erin Sutton  is a 77 y.o. female presents with chief complaint as above.  Patient states that she took her blood pressure earlier today and that it was low.  She called a friend because of that, and her friend states that she was speaking very confused over the phone.  Her friend went over to see the patient and the patient was lethargic and confused still, so she called patient's primary care doctor who recommended they call EMS.  EMS arrived and found her blood pressure was significantly elevated, with systolic in the 976B.  They checked this compared  to her pressure device and found that her device was faulty, reading a low pressure erroneously.  She was brought to the ED, and here her blood pressure corrected after a brief period of time.  After her blood pressure came down she became more alert, less confused, and returned very close to what is her baseline mental functioning.  Initial work-up is largely within normal limits.  Hospitalist called for admission and further evaluation.    VITAL SIGNS:  Blood pressure (!) 169/63, pulse 75, temperature 98.5 F (36.9 C), temperature source Oral, resp. rate 15, height 5\' 3"  (1.6 m), weight 79.4 kg, SpO2 98 %.  I/O:  No intake or output data in the 24 hours ending 07/17/18 2027  PHYSICAL EXAMINATION:   GENERAL:  77 y.o.-year-old patient lying in the bed with no acute distress.  While I was in the room, patient was constantly trying to clear her throat as if something stuck in her throat. EYES: Pupils equal, round, reactive to light and accommodation. No scleral icterus. Extraocular muscles intact.  HEENT: Head atraumatic, normocephalic. Oropharynx and nasopharynx clear.  NECK:  Supple, no jugular venous distention. No thyroid enlargement, no tenderness.  LUNGS: Normal breath sounds bilaterally, no wheezing, rales,rhonchi or crepitation. No use of accessory muscles of respiration.  CARDIOVASCULAR: S1, S2 normal. No murmurs, rubs, or gallops.  ABDOMEN: Soft, nontender, nondistended. Bowel sounds present. No organomegaly or mass.  EXTREMITIES: No pedal edema, cyanosis, or clubbing. Both feet warm, pulses and sensation intact. NEUROLOGIC: Cranial nerves II through XII are intact. Muscle strength 4/5 in all extremities. Sensation intact. Gait not checked.  PSYCHIATRIC: The patient is alert and oriented x 3.  SKIN: No obvious rash, lesion, or ulcer.   DATA REVIEW:   CBC No results for input(s): WBC, HGB, HCT, PLT in the last 168 hours.  Chemistries  No results for input(s): NA, K, CL, CO2,  GLUCOSE, BUN, CREATININE, CALCIUM, MG, AST, ALT, ALKPHOS, BILITOT in the last 168 hours.  Invalid input(s): GFRCGP  Cardiac Enzymes No results for input(s): TROPONINI in the last 168 hours.  Microbiology Results  Results for orders placed or performed during the hospital encounter of 12/17/16  Culture, blood (routine x 2)     Status: None   Collection Time: 12/17/16  1:33 PM  Result Value Ref Range Status   Specimen Description BLOOD L AC  Final   Special Requests   Final    BOTTLES DRAWN AEROBIC AND ANAEROBIC AER 13ML ANA 14ML   Culture NO GROWTH 5 DAYS  Final  Report Status 12/22/2016 FINAL  Final  Culture, blood (routine x 2)     Status: Abnormal   Collection Time: 12/17/16  1:34 PM  Result Value Ref Range Status   Specimen Description BLOOD R AC  Final   Special Requests   Final    BOTTLES DRAWN AEROBIC AND ANAEROBIC AER 14ML ANA 15ML   Culture  Setup Time   Final    GRAM POSITIVE COCCI ANAEROBIC BOTTLE ONLY CRITICAL RESULT CALLED TO, READ BACK BY AND VERIFIED WITH:  MATT MCBANE AT 0700 12/18/16 SDR    Culture (A)  Final    GROUP A STREP (S.PYOGENES) ISOLATED HEALTH DEPARTMENT NOTIFIED Performed at [facility], [place] [address], [place], [place] [address]    Report Status 12/20/2016 FINAL  Final   Organism ID, Bacteria GROUP A STREP (S.PYOGENES) ISOLATED  Final      Susceptibility   Group a strep (s.pyogenes) isolated - MIC*    ERYTHROMYCIN <=0.12 SENSITIVE Sensitive     TETRACYCLINE <=0.25 SENSITIVE Sensitive     VANCOMYCIN <=0.12 SENSITIVE Sensitive     CLINDAMYCIN <=0.25 SENSITIVE Sensitive     PENICILLIN Value in next row Sensitive      SENSITIVE<=0.06    CEFTRIAXONE Value in next row Sensitive      SENSITIVE<=0.12    * GROUP A STREP (S.PYOGENES) ISOLATED  Urine culture     Status: Abnormal   Collection Time: 12/17/16  1:34 PM  Result Value Ref Range Status   Specimen Description URINE, CLEAN CATCH  Final   Special Requests NONE  Final   Culture  MULTIPLE SPECIES PRESENT, SUGGEST RECOLLECTION (A)  Final   Report Status 12/18/2016 FINAL  Final  Blood Culture ID Panel (Reflexed)     Status: Abnormal   Collection Time: 12/17/16  1:34 PM  Result Value Ref Range Status   Enterococcus species NOT DETECTED NOT DETECTED Final   Listeria monocytogenes NOT DETECTED NOT DETECTED Final   Staphylococcus species NOT DETECTED NOT DETECTED Final   Staphylococcus aureus NOT DETECTED NOT DETECTED Final   Streptococcus species DETECTED (A) NOT DETECTED Final    Comment: CRITICAL RESULT CALLED TO, READ BACK BY AND VERIFIED WITH:  MATT MCBANE AT 0700 12/18/16 SDR    Streptococcus agalactiae NOT DETECTED NOT DETECTED Final   Streptococcus pneumoniae NOT DETECTED NOT DETECTED Final   Streptococcus pyogenes DETECTED (A) NOT DETECTED Final    Comment: CRITICAL RESULT CALLED TO, READ BACK BY AND VERIFIED WITH:  MATT MCBANE AT 0700 12/18/16 SDR    Acinetobacter baumannii NOT DETECTED NOT DETECTED Final   Enterobacteriaceae species NOT DETECTED NOT DETECTED Final   Enterobacter cloacae complex NOT DETECTED NOT DETECTED Final   Escherichia coli NOT DETECTED NOT DETECTED Final   Klebsiella oxytoca NOT DETECTED NOT DETECTED Final   Klebsiella pneumoniae NOT DETECTED NOT DETECTED Final   Proteus species NOT DETECTED NOT DETECTED Final   Serratia marcescens NOT DETECTED NOT DETECTED Final   Haemophilus influenzae NOT DETECTED NOT DETECTED Final   Neisseria meningitidis NOT DETECTED NOT DETECTED Final   Pseudomonas aeruginosa NOT DETECTED NOT DETECTED Final   Candida albicans NOT DETECTED NOT DETECTED Final   Candida glabrata NOT DETECTED NOT DETECTED Final   Candida krusei NOT DETECTED NOT DETECTED Final   Candida parapsilosis NOT DETECTED NOT DETECTED Final   Candida tropicalis NOT DETECTED NOT DETECTED Final  CULTURE, BLOOD (ROUTINE X 2) w Reflex to ID Panel     Status: None   Collection Time:  12/18/16  6:10 PM  Result Value Ref Range Status   Specimen  Description BLOOD  RIGHT FOERARM  Final   Special Requests   Final    BOTTLES DRAWN AEROBIC AND ANAEROBIC  AER 7 ML ANA 3 ML   Culture NO GROWTH 5 DAYS  Final   Report Status 12/23/2016 FINAL  Final  CULTURE, BLOOD (ROUTINE X 2) w Reflex to ID Panel     Status: None   Collection Time: 12/18/16  6:10 PM  Result Value Ref Range Status   Specimen Description BLOOD  RIGHT AC  Final   Special Requests   Final    BOTTLES DRAWN AEROBIC AND ANAEROBIC  AER 8 ML ANA 7 ML    Culture NO GROWTH 5 DAYS  Final   Report Status 12/23/2016 FINAL  Final    RADIOLOGY:  No results found.  EKG:   Orders placed or performed during the hospital encounter of 06/30/18  . ED EKG  . ED EKG  . EKG      Management plans discussed with the patient, family and they are in agreement.  CODE STATUS:  Code Status History    Date Active Date Inactive Code Status Order ID Comments User Context   07/01/2018 0004 07/03/2018 0105 Full Code [id number]  Lance Coon, MD Inpatient   12/17/2016 1751 12/21/2016 1748 Full Code [id number]  Demetrios Loll, MD Inpatient    Advance Directive Documentation     Most Recent Value  Type of Advance Directive  Healthcare Power of [place], Living will  Pre-existing out of facility DNR order (yellow form or pink MOST form)  -  "MOST" Form in Place?  -      TOTAL TIME TAKING CARE OF THIS PATIENT: 45 minutes.    Vaughan Basta M.D on 07/17/2018 at 8:27 PM  Between 7am to 6pm - Pager - [phone]  After 6pm go to [email or web address] - password [email or web address] [facility]  Office  [phone]  CC: Primary care physician; Baxter Hire, MD   Note: This dictation was prepared with Dragon dictation along with smaller phrase technology. Any transcriptional errors that result from this process are unintentional.

## 2018-09-02 ENCOUNTER — Emergency Department
Admission: EM | Admit: 2018-09-02 | Discharge: 2018-09-02 | Disposition: A | Discharge status: Home / Self Care | Payer: Medicare PPO | Attending: Emergency Medicine | Admitting: Emergency Medicine

## 2018-09-02 ENCOUNTER — Emergency Department: Payer: Medicare PPO

## 2018-09-02 ENCOUNTER — Other Ambulatory Visit: Payer: Self-pay

## 2018-09-02 DIAGNOSIS — R0789 Other chest pain: Secondary | ICD-10-CM | POA: Diagnosis present

## 2018-09-02 DIAGNOSIS — Z79899 Other long term (current) drug therapy: Secondary | ICD-10-CM | POA: Diagnosis not present

## 2018-09-02 DIAGNOSIS — N189 Chronic kidney disease, unspecified: Secondary | ICD-10-CM | POA: Insufficient documentation

## 2018-09-02 DIAGNOSIS — I129 Hypertensive chronic kidney disease with stage 1 through stage 4 chronic kidney disease, or unspecified chronic kidney disease: Secondary | ICD-10-CM | POA: Diagnosis not present

## 2018-09-02 DIAGNOSIS — R079 Chest pain, unspecified: Secondary | ICD-10-CM

## 2018-09-02 DIAGNOSIS — E039 Hypothyroidism, unspecified: Secondary | ICD-10-CM | POA: Diagnosis not present

## 2018-09-02 DIAGNOSIS — R110 Nausea: Secondary | ICD-10-CM | POA: Diagnosis not present

## 2018-09-02 DIAGNOSIS — Z7982 Long term (current) use of aspirin: Secondary | ICD-10-CM | POA: Insufficient documentation

## 2018-09-02 LAB — HEPATIC FUNCTION PANEL
ALT: 9 U/L (ref 0–44)
AST: 20 U/L (ref 15–41)
Albumin: 4.2 g/dL (ref 3.5–5.0)
Alkaline Phosphatase: 54 U/L (ref 38–126)
Bilirubin, Direct: <0.1 mg/dL (ref 0.0–0.2)
Total Bilirubin: 0.7 mg/dL (ref 0.3–1.2)
Total Protein: 7.8 g/dL (ref 6.5–8.1)

## 2018-09-02 LAB — CBC
HCT: 36.8 % (ref 36.0–46.0)
Hemoglobin: 11.7 g/dL — ABNORMAL LOW (ref 12.0–15.0)
MCH: 27.8 pg (ref 26.0–34.0)
MCHC: 31.8 g/dL (ref 30.0–36.0)
MCV: 87.4 fL (ref 80.0–100.0)
Platelets: 279 10*3/uL (ref 150–400)
RBC: 4.21 MIL/uL (ref 3.87–5.11)
RDW: 14.9 % (ref 11.5–15.5)
WBC: 6.1 10*3/uL (ref 4.0–10.5)
nRBC: 0 % (ref 0.0–0.2)

## 2018-09-02 LAB — URINALYSIS, ROUTINE W REFLEX MICROSCOPIC
Bilirubin Urine: NEGATIVE
Glucose, UA: NEGATIVE mg/dL
Ketones, ur: NEGATIVE mg/dL
Nitrite: NEGATIVE
Protein, ur: NEGATIVE mg/dL
Specific Gravity, Urine: 1.006 (ref 1.005–1.030)
pH: 7 (ref 5.0–8.0)

## 2018-09-02 LAB — BASIC METABOLIC PANEL
Anion gap: 10 (ref 5–15)
BUN: 14 mg/dL (ref 8–23)
CO2: 25 mmol/L (ref 22–32)
Calcium: 10.3 mg/dL (ref 8.9–10.3)
Chloride: 104 mmol/L (ref 98–111)
Creatinine, Ser: 0.96 mg/dL (ref 0.44–1.00)
GFR calc Af Amer: >60 mL/min (ref >60)
GFR calc non Af Amer: 56 mL/min — ABNORMAL LOW (ref >60)
Glucose, Bld: 114 mg/dL — ABNORMAL HIGH (ref 70–99)
Potassium: 4 mmol/L (ref 3.5–5.1)
Sodium: 139 mmol/L (ref 135–145)

## 2018-09-02 LAB — LIPASE, BLOOD: Lipase: 32 U/L (ref 11–51)

## 2018-09-02 LAB — TROPONIN I: Troponin I: <0.03 ng/mL (ref <0.03)

## 2018-09-02 MED ORDER — ONDANSETRON HCL 4 MG/2ML IJ SOLN
4.0000 mg | Freq: Once | INTRAMUSCULAR | Status: AC
  Administered 2018-09-02: 4 mg via INTRAVENOUS
  Filled 2018-09-02: qty 2

## 2018-09-02 NOTE — ED Notes (Signed)
[place] RN reviewed d/c instructions, follow-up care with patient. Patient verbalized understanding of all information discussed. Erin Sutton

## 2018-09-02 NOTE — ED Provider Notes (Signed)
[facility] Emergency Department Provider Note  Time seen: 4:39 PM  I have reviewed the triage vital signs and the nursing notes.   HISTORY  Chief Complaint Chest Pain    HPI Erin Sutton is a 77 y.o. female with a past medical history of anxiety, CKD, gastric reflux, hypertension, presents to the emergency department with complaints of chest tightness, nausea and elevated blood pressure.  According to the patient for the past several months she has been experiencing intermittent nausea as prescribed Zofran at home for the nausea.  Awoke this morning feeling nauseated with generalized weakness.  Also states chest tightness although states this has been an ongoing issue for the last several months as well.  Patient eventually called EMS, they took her blood pressure was elevated greater than 846 systolic, patient came to the emergency department for evaluation.  Upon arrival patient states very minimal chest tightness, blood pressures come down to 160s, the patient states she feels much better.  Continues to state some nausea but states this is an ongoing issue for her.  Does state mild generalized fatigue as well today.   Past Medical History:  Diagnosis Date  . Anxiety   . Arthritis   . Chronic kidney disease   . Constipation   . GERD (gastroesophageal reflux disease)   . Hypertension   . Hypothyroidism   . Osteoporosis   . Sleep apnea   . Urinary incontinence     Patient Active Problem List   Diagnosis Date Noted  . Confusion 06/30/2018  . Hypothyroidism 06/30/2018  . HTN (hypertension) 06/30/2018  . GERD (gastroesophageal reflux disease) 06/30/2018  . Anxiety 06/30/2018  . Accelerated hypertension 06/30/2018  . Sepsis ([place]) 12/17/2016    Past Surgical History:  Procedure Laterality Date  . BREAST BIOPSY Right    neg/stereo  . BREAST BIOPSY Left    neg/stereo  . BREAST EXCISIONAL BIOPSY Right    neg  . COLONOSCOPY WITH PROPOFOL N/A  07/02/2015   Procedure: COLONOSCOPY WITH PROPOFOL;  Surgeon: Manya Silvas, MD;  Location: [facility] ENDOSCOPY;  Service: Endoscopy;  Laterality: N/A;  . CORRECTION HAMMER TOE Right   . ESOPHAGOGASTRODUODENOSCOPY (EGD) WITH PROPOFOL N/A 07/02/2015   Procedure: ESOPHAGOGASTRODUODENOSCOPY (EGD) WITH PROPOFOL;  Surgeon: Manya Silvas, MD;  Location: [facility] ENDOSCOPY;  Service: Endoscopy;  Laterality: N/A;  . JOINT REPLACEMENT Left    knee  . REPLACEMENT TOTAL KNEE Left   . SKIN BIOPSY Right     Prior to Admission medications   Medication Sig Start Date End Date Taking? Authorizing Provider  amLODipine (NORVASC) 10 MG tablet Take 10 mg by mouth daily.    [provider]  aspirin EC 81 MG tablet Take 81 mg by mouth daily.    [provider]  carvedilol (COREG) 3.125 MG tablet Take 4 tablets (12.5 mg total) by mouth 2 (two) times daily with a meal. 07/03/18   Vaughan Basta, MD  Cholecalciferol (VITAMIN D PO) Take 1 tablet by mouth daily.    [provider]  isosorbide mononitrate (IMDUR) 30 MG 24 hr tablet Take 30 mg by mouth daily.    [provider]  levothyroxine (SYNTHROID, LEVOTHROID) 50 MCG tablet Take 50 mcg by mouth daily before breakfast.    [provider]  LORazepam (ATIVAN) 2 MG tablet Take 2 mg by mouth every 8 (eight) hours as needed for anxiety.     [provider]  magnesium oxide (MAG-OX) 400 MG tablet Take 400 mg by  mouth daily.    [provider]  pantoprazole (PROTONIX) 40 MG tablet Take 40 mg by mouth daily.    [provider]  Polyethyl Glycol-Propyl Glycol (SYSTANE OP) Place 1 drop into both eyes 2 (two) times daily.    [provider]  potassium chloride SA (K-DUR,KLOR-CON) 20 MEQ tablet Take 20 mEq by mouth every morning.    [provider]  pravastatin (PRAVACHOL) 40 MG tablet Take 40 mg by mouth at bedtime.     [provider]  sertraline (ZOLOFT) 50 MG tablet Take 50 mg  by mouth at bedtime.     [provider]    Allergies  Allergen Reactions  . Ace Inhibitors Cough  . Amoxicillin Rash    Has patient had a PCN reaction causing immediate rash, facial/tongue/throat swelling, SOB or lightheadedness with hypotension: No Has patient had a PCN reaction causing severe rash involving mucus membranes or skin necrosis: No Has patient had a PCN reaction that required hospitalization: No Has patient had a PCN reaction occurring within the last 10 years: No If all of the above answers are "NO", then may proceed with Cephalosporin use.   Marland Kitchen Benicar [Olmesartan] Rash    Itching rash  . Codeine Sulfate Rash  . Levaquin [Levofloxacin In D5w] Rash    Alters mental status  . Penicillin V Potassium Rash    Has patient had a PCN reaction causing immediate rash, facial/tongue/throat swelling, SOB or lightheadedness with hypotension: No Has patient had a PCN reaction causing severe rash involving mucus membranes or skin necrosis: No Has patient had a PCN reaction that required hospitalization: No Has patient had a PCN reaction occurring within the last 10 years: No If all of the above answers are "NO", then may proceed with Cephalosporin use.   . Sulfa Antibiotics Rash    Family History  Problem Relation Age of Onset  . Hypertension Mother   . Diabetes Mother   . Heart attack Mother   . Asthma Mother   . Peripheral vascular disease Mother   . Heart attack Father   . Hypertension Father   . Diabetes Father     Social History Social History   Tobacco Use  . Smoking status: Never Smoker  . Smokeless tobacco: Never Used  Substance Use Topics  . Alcohol use: No  . Drug use: No    Review of Systems Constitutional: Negative for fever.  Positive for generalized fatigue. Cardiovascular: Negative for chest "pain", but positive for tightness, mild. Respiratory: Negative for shortness of breath. Gastrointestinal: Negative for abdominal pain.  Positive  for nausea. Genitourinary: Negative for urinary compaints Musculoskeletal: Negative for musculoskeletal complaints Skin: Negative for skin complaints  Neurological: Negative for headache All other ROS negative  ____________________________________________   PHYSICAL EXAM:  VITAL SIGNS: ED Triage Vitals  Enc Vitals Group     BP 09/02/18 1557 (!) 163/86     Pulse Rate 09/02/18 1557 67     Resp 09/02/18 1557 17     Temp 09/02/18 1557 98.8 F (37.1 C)     Temp Source 09/02/18 1557 Oral     SpO2 09/02/18 1557 94 %     Weight 09/02/18 1557 166 lb (75.3 kg)     Height 09/02/18 1557 5' (1.524 m)     Head Circumference --      Peak Flow --      Pain Score 09/02/18 1556 6     Pain Loc --      Pain  Edu? --      Excl. in [place]? --    Constitutional: Alert and oriented. Well appearing and in no distress. Eyes: Normal exam ENT   Head: Normocephalic and atraumatic.   Mouth/Throat: Mucous membranes are moist. Cardiovascular: Normal rate, regular rhythm. No murmur Respiratory: Normal respiratory effort without tachypnea nor retractions. Breath sounds are clear Gastrointestinal: Soft and nontender. No distention.  Musculoskeletal: Nontender with normal range of motion in all extremities. No lower extremity tenderness or edema. Neurologic:  Normal speech and language. No gross focal neurologic deficits Skin:  Skin is warm, dry and intact.  Psychiatric: Mood and affect are normal.   ____________________________________________    EKG  EKG reviewed and interpreted by myself shows a sinus rhythm at 66 bpm with a widened QRS, normal axis, largely normal intervals with nonspecific ST changes.  No ST elevation.  ____________________________________________    RADIOLOGY  Chest x-ray is negative.  ____________________________________________   INITIAL IMPRESSION / ASSESSMENT AND PLAN / ED COURSE  Pertinent labs & imaging results that were available during my care of the patient  were reviewed by me and considered in my medical decision making (see chart for details).  Patient presents emergency department for chest tightness, nausea, hypertension.  Since arrival to the emergency department the patient's blood pressure has come down nicely, patient states she is feeling better.  Continues to feel nauseated but states this has been chronic times months.  States her weakness is improving as well.  We will check labs including cardiac enzymes we will obtain a chest x-ray and continue to closely monitor.  Patient's labs are largely within normal limits including a negative troponin.  Chest x-ray is negative, EKG is reassuring.  Labs are normal.  Chest x-ray is negative.  EKG is reassuring.  Urinalysis largely negative urine culture has been added on as a precaution.  Moderately hypertensive currently.  Given her negative work-up anticipate likely discharge home.  Patient is feeling better with a negative work-up.  Blood pressure currently 144/80.  We will discharge home with PCP follow-up.  Urine culture has been added onto her urinalysis.  ____________________________________________   FINAL CLINICAL IMPRESSION(S) / ED DIAGNOSES  Nausea Hypertension Chest pain    Harvest Dark, MD 09/02/18 2013

## 2018-09-02 NOTE — ED Triage Notes (Addendum)
Pt brought in by [facility] due to chest pressure since this morning when she woke up. C/o nausea, no vomiting. Denies sob. Also c/o headache. States she has acid reflux and drank Pepsi but it did not help her sx. C/o dizziness. States pain is in center of chest and does not radiate. Lives alone. A&Ox4. Pt took 324 ASA prior to EMS arrival.

## 2018-09-02 NOTE — ED Notes (Signed)
Pt was able to tolerate sips of water.

## 2018-09-04 LAB — URINE CULTURE

## 2019-07-02 ENCOUNTER — Other Ambulatory Visit: Payer: Self-pay

## 2019-07-02 ENCOUNTER — Emergency Department: Payer: Medicare PPO

## 2019-07-02 ENCOUNTER — Inpatient Hospital Stay
Admission: EM | Admit: 2019-07-02 | Discharge: 2019-07-06 | DRG: 392 | Disposition: A | Discharge status: Skilled Nursing Facility | Payer: Medicare PPO | Attending: Internal Medicine | Admitting: Internal Medicine

## 2019-07-02 DIAGNOSIS — Z79890 Hormone replacement therapy (postmenopausal): Secondary | ICD-10-CM

## 2019-07-02 DIAGNOSIS — Z882 Allergy status to sulfonamides status: Secondary | ICD-10-CM | POA: Diagnosis not present

## 2019-07-02 DIAGNOSIS — K219 Gastro-esophageal reflux disease without esophagitis: Secondary | ICD-10-CM | POA: Diagnosis present

## 2019-07-02 DIAGNOSIS — Z79899 Other long term (current) drug therapy: Secondary | ICD-10-CM | POA: Diagnosis not present

## 2019-07-02 DIAGNOSIS — E039 Hypothyroidism, unspecified: Secondary | ICD-10-CM | POA: Diagnosis present

## 2019-07-02 DIAGNOSIS — Z885 Allergy status to narcotic agent status: Secondary | ICD-10-CM

## 2019-07-02 DIAGNOSIS — M810 Age-related osteoporosis without current pathological fracture: Secondary | ICD-10-CM | POA: Diagnosis present

## 2019-07-02 DIAGNOSIS — K5732 Diverticulitis of large intestine without perforation or abscess without bleeding: Principal | ICD-10-CM | POA: Diagnosis present

## 2019-07-02 DIAGNOSIS — G4730 Sleep apnea, unspecified: Secondary | ICD-10-CM | POA: Diagnosis present

## 2019-07-02 DIAGNOSIS — Z880 Allergy status to penicillin: Secondary | ICD-10-CM | POA: Diagnosis not present

## 2019-07-02 DIAGNOSIS — Z20828 Contact with and (suspected) exposure to other viral communicable diseases: Secondary | ICD-10-CM | POA: Diagnosis present

## 2019-07-02 DIAGNOSIS — N189 Chronic kidney disease, unspecified: Secondary | ICD-10-CM | POA: Diagnosis present

## 2019-07-02 DIAGNOSIS — Z881 Allergy status to other antibiotic agents status: Secondary | ICD-10-CM | POA: Diagnosis not present

## 2019-07-02 DIAGNOSIS — E876 Hypokalemia: Secondary | ICD-10-CM | POA: Diagnosis present

## 2019-07-02 DIAGNOSIS — M1990 Unspecified osteoarthritis, unspecified site: Secondary | ICD-10-CM | POA: Diagnosis present

## 2019-07-02 DIAGNOSIS — N3289 Other specified disorders of bladder: Secondary | ICD-10-CM | POA: Diagnosis not present

## 2019-07-02 DIAGNOSIS — Z96652 Presence of left artificial knee joint: Secondary | ICD-10-CM | POA: Diagnosis present

## 2019-07-02 DIAGNOSIS — Z8249 Family history of ischemic heart disease and other diseases of the circulatory system: Secondary | ICD-10-CM

## 2019-07-02 DIAGNOSIS — R112 Nausea with vomiting, unspecified: Secondary | ICD-10-CM | POA: Diagnosis not present

## 2019-07-02 DIAGNOSIS — R110 Nausea: Secondary | ICD-10-CM | POA: Diagnosis not present

## 2019-07-02 DIAGNOSIS — E892 Postprocedural hypoparathyroidism: Secondary | ICD-10-CM | POA: Diagnosis present

## 2019-07-02 DIAGNOSIS — Z888 Allergy status to other drugs, medicaments and biological substances status: Secondary | ICD-10-CM | POA: Diagnosis not present

## 2019-07-02 DIAGNOSIS — I129 Hypertensive chronic kidney disease with stage 1 through stage 4 chronic kidney disease, or unspecified chronic kidney disease: Secondary | ICD-10-CM | POA: Diagnosis present

## 2019-07-02 DIAGNOSIS — F419 Anxiety disorder, unspecified: Secondary | ICD-10-CM | POA: Diagnosis present

## 2019-07-02 DIAGNOSIS — Z7982 Long term (current) use of aspirin: Secondary | ICD-10-CM | POA: Diagnosis not present

## 2019-07-02 DIAGNOSIS — Z8673 Personal history of transient ischemic attack (TIA), and cerebral infarction without residual deficits: Secondary | ICD-10-CM | POA: Diagnosis not present

## 2019-07-02 LAB — URINALYSIS, ROUTINE W REFLEX MICROSCOPIC
Bacteria, UA: NONE SEEN
Bilirubin Urine: NEGATIVE
Glucose, UA: NEGATIVE mg/dL
Ketones, ur: 5 mg/dL — AB
Leukocytes,Ua: NEGATIVE
Nitrite: NEGATIVE
Protein, ur: NEGATIVE mg/dL
Specific Gravity, Urine: 1.025 (ref 1.005–1.030)
Squamous Epithelial / LPF: NONE SEEN (ref 0–5)
pH: 7 (ref 5.0–8.0)

## 2019-07-02 LAB — LIPASE, BLOOD: Lipase: 28 U/L (ref 11–51)

## 2019-07-02 LAB — COMPREHENSIVE METABOLIC PANEL
ALT: 10 U/L (ref 0–44)
AST: 23 U/L (ref 15–41)
Albumin: 4.3 g/dL (ref 3.5–5.0)
Alkaline Phosphatase: 47 U/L (ref 38–126)
Anion gap: 12 (ref 5–15)
BUN: 19 mg/dL (ref 8–23)
CO2: 24 mmol/L (ref 22–32)
Calcium: 9.8 mg/dL (ref 8.9–10.3)
Chloride: 102 mmol/L (ref 98–111)
Creatinine, Ser: 0.77 mg/dL (ref 0.44–1.00)
GFR calc Af Amer: >60 mL/min (ref >60)
GFR calc non Af Amer: >60 mL/min (ref >60)
Glucose, Bld: 114 mg/dL — ABNORMAL HIGH (ref 70–99)
Potassium: 3.3 mmol/L — ABNORMAL LOW (ref 3.5–5.1)
Sodium: 138 mmol/L (ref 135–145)
Total Bilirubin: 0.7 mg/dL (ref 0.3–1.2)
Total Protein: 7.4 g/dL (ref 6.5–8.1)

## 2019-07-02 LAB — CBC
HCT: 38.9 % (ref 36.0–46.0)
Hemoglobin: 12.4 g/dL (ref 12.0–15.0)
MCH: 27.4 pg (ref 26.0–34.0)
MCHC: 31.9 g/dL (ref 30.0–36.0)
MCV: 85.9 fL (ref 80.0–100.0)
Platelets: 254 10*3/uL (ref 150–400)
RBC: 4.53 MIL/uL (ref 3.87–5.11)
RDW: 16.6 % — ABNORMAL HIGH (ref 11.5–15.5)
WBC: 9.1 10*3/uL (ref 4.0–10.5)
nRBC: 0 % (ref 0.0–0.2)

## 2019-07-02 LAB — T4, FREE: Free T4: 1.34 ng/dL — ABNORMAL HIGH (ref 0.61–1.12)

## 2019-07-02 LAB — SARS CORONAVIRUS 2 BY RT PCR (HOSPITAL ORDER, PERFORMED IN ~~LOC~~ HOSPITAL LAB): SARS Coronavirus 2: NEGATIVE

## 2019-07-02 LAB — MAGNESIUM: Magnesium: 1.8 mg/dL (ref 1.7–2.4)

## 2019-07-02 LAB — TSH: TSH: 1.745 u[IU]/mL (ref 0.350–4.500)

## 2019-07-02 LAB — TROPONIN I (HIGH SENSITIVITY)
Troponin I (High Sensitivity): 7 ng/L (ref <18)
Troponin I (High Sensitivity): 8 ng/L (ref <18)

## 2019-07-02 LAB — PHOSPHORUS: Phosphorus: 2.3 mg/dL — ABNORMAL LOW (ref 2.5–4.6)

## 2019-07-02 MED ORDER — IOHEXOL 300 MG/ML  SOLN
100.0000 mL | Freq: Once | INTRAMUSCULAR | Status: AC | PRN
  Administered 2019-07-02: 100 mL via INTRAVENOUS

## 2019-07-02 MED ORDER — POTASSIUM CHLORIDE 10 MEQ/100ML IV SOLN
10.0000 meq | INTRAVENOUS | Status: AC
  Administered 2019-07-02: 10 meq via INTRAVENOUS
  Filled 2019-07-02 (×2): qty 100

## 2019-07-02 MED ORDER — ENOXAPARIN SODIUM 40 MG/0.4ML ~~LOC~~ SOLN
40.0000 mg | SUBCUTANEOUS | Status: DC
  Administered 2019-07-02 – 2019-07-05 (×4): 40 mg via SUBCUTANEOUS
  Filled 2019-07-02 (×4): qty 0.4

## 2019-07-02 MED ORDER — LORAZEPAM 2 MG/ML IJ SOLN
1.0000 mg | Freq: Four times a day (QID) | INTRAMUSCULAR | Status: DC | PRN
  Administered 2019-07-02 – 2019-07-05 (×5): 1 mg via INTRAVENOUS
  Filled 2019-07-02 (×5): qty 1

## 2019-07-02 MED ORDER — CARVEDILOL 3.125 MG PO TABS
12.5000 mg | ORAL_TABLET | Freq: Two times a day (BID) | ORAL | Status: DC
  Administered 2019-07-02 – 2019-07-06 (×8): 12.5 mg via ORAL
  Filled 2019-07-02 (×5): qty 4
  Filled 2019-07-02: qty 2
  Filled 2019-07-02 (×3): qty 4

## 2019-07-02 MED ORDER — ONDANSETRON HCL 4 MG/2ML IJ SOLN
4.0000 mg | Freq: Four times a day (QID) | INTRAMUSCULAR | Status: DC
  Administered 2019-07-02 – 2019-07-03 (×2): 4 mg via INTRAVENOUS
  Filled 2019-07-02 (×2): qty 2

## 2019-07-02 MED ORDER — SERTRALINE HCL 50 MG PO TABS
100.0000 mg | ORAL_TABLET | Freq: Every day | ORAL | Status: DC
  Administered 2019-07-02 – 2019-07-05 (×4): 100 mg via ORAL
  Filled 2019-07-02 (×4): qty 2

## 2019-07-02 MED ORDER — PROCHLORPERAZINE EDISYLATE 10 MG/2ML IJ SOLN
10.0000 mg | Freq: Four times a day (QID) | INTRAMUSCULAR | Status: DC | PRN
  Filled 2019-07-02: qty 2

## 2019-07-02 MED ORDER — ACETAMINOPHEN 650 MG RE SUPP: 650.0000 mg | Freq: Four times a day (QID) | RECTAL | Status: DC | PRN

## 2019-07-02 MED ORDER — TRAMADOL HCL 50 MG PO TABS: 50.0000 mg | ORAL_TABLET | Freq: Four times a day (QID) | ORAL | Status: DC | PRN

## 2019-07-02 MED ORDER — AMLODIPINE BESYLATE 10 MG PO TABS
10.0000 mg | ORAL_TABLET | Freq: Every day | ORAL | Status: DC
  Administered 2019-07-02 – 2019-07-06 (×5): 10 mg via ORAL
  Filled 2019-07-02 (×3): qty 1
  Filled 2019-07-02: qty 2
  Filled 2019-07-02: qty 1

## 2019-07-02 MED ORDER — ACETAMINOPHEN 325 MG PO TABS
650.0000 mg | ORAL_TABLET | Freq: Four times a day (QID) | ORAL | Status: DC | PRN
  Administered 2019-07-02: 650 mg via ORAL
  Filled 2019-07-02: qty 2

## 2019-07-02 MED ORDER — LEVOTHYROXINE SODIUM 50 MCG PO TABS
50.0000 ug | ORAL_TABLET | Freq: Every day | ORAL | Status: DC
  Administered 2019-07-03 – 2019-07-06 (×4): 50 ug via ORAL
  Filled 2019-07-02 (×5): qty 1

## 2019-07-02 MED ORDER — LABETALOL HCL 5 MG/ML IV SOLN
10.0000 mg | INTRAVENOUS | Status: DC | PRN
  Filled 2019-07-02: qty 4

## 2019-07-02 MED ORDER — LORAZEPAM 2 MG/ML IJ SOLN
0.5000 mg | Freq: Once | INTRAMUSCULAR | Status: AC | PRN
  Administered 2019-07-02: 0.5 mg via INTRAVENOUS
  Filled 2019-07-02: qty 1

## 2019-07-02 MED ORDER — POTASSIUM CHLORIDE 10 MEQ/100ML IV SOLN
10.0000 meq | INTRAVENOUS | Status: AC
  Administered 2019-07-02 (×2): 10 meq via INTRAVENOUS
  Filled 2019-07-02 (×3): qty 100

## 2019-07-02 MED ORDER — SODIUM CHLORIDE 0.9 % IV SOLN
INTRAVENOUS | Status: DC
  Administered 2019-07-02 – 2019-07-04 (×4): via INTRAVENOUS

## 2019-07-02 MED ORDER — MAGNESIUM OXIDE 400 (241.3 MG) MG PO TABS
400.0000 mg | ORAL_TABLET | Freq: Every day | ORAL | Status: DC
  Administered 2019-07-03 – 2019-07-06 (×4): 400 mg via ORAL
  Filled 2019-07-02 (×4): qty 1

## 2019-07-02 NOTE — ED Notes (Signed)
Patient transported to MRI 

## 2019-07-02 NOTE — ED Notes (Signed)
Attempt to call report, rn not available. 

## 2019-07-02 NOTE — ED Triage Notes (Signed)
Pt arrives via EMS from home after having vomiting x2 weeks- has been seen at the ED is [place] twice and has seen her primary care dr once for this problem with no relief- pt states she is unable to keep anything down

## 2019-07-02 NOTE — ED Notes (Signed)
EDP at bedside  

## 2019-07-02 NOTE — H&P (Signed)
[facility] NAME: Erin Sutton    MR#:  [id number]  DATE OF BIRTH:  [birth year]  DATE OF ADMISSION:  07/02/2019  PRIMARY CARE PHYSICIAN: Baxter Hire, MD   REQUESTING/REFERRING PHYSICIAN: Dr. Marjean Donna  CHIEF COMPLAINT:   Chief Complaint  Patient presents with  . Emesis    HISTORY OF PRESENT ILLNESS:  Erin Sutton  is a 78 y.o. 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  Patient states that she is independent at baseline and lives at home by herself.  She has seen her PCP about 2 weeks ago for right upper quadrant abdominal pain and nausea vomiting.  She has had a history of constipation given her hypercalcemia from primary hyperparathyroidism.  Take stool softeners at home.  Denies any recent constipation.  She had imaging studies done and was started on Flagyl and doxycycline about 10 days ago as outpatient.  She feels that her nausea and vomiting has not improved at all.  She had extremely poor oral intake in the last couple of weeks.  She has known history of GERD, takes meloxicam at home daily.  Now the pain is more in the epigastric region with worsening heartburn symptoms and still has right upper quadrant pain.  No diarrhea.  No chest pain or dyspnea or fevers or sick contacts.  No recent travel. Labs here showing hypokalemia.  CT of the abdomen showing diverticulosis, gallbladder with no stones or swelling around it.  No obstruction noted.  PAST MEDICAL HISTORY:   Past Medical History:  Diagnosis Date  . Anxiety   . Arthritis   . Chronic kidney disease   . Constipation   . GERD (gastroesophageal reflux disease)   . Hypertension   . Hypothyroidism   . Osteoporosis   . Sleep apnea    . Urinary incontinence     PAST SURGICAL HISTORY:   Past Surgical History:  Procedure Laterality Date  . BREAST BIOPSY Right    neg/stereo  . BREAST BIOPSY Left    neg/stereo  . BREAST EXCISIONAL BIOPSY Right    neg  . COLONOSCOPY WITH PROPOFOL N/A 07/02/2015   Procedure: COLONOSCOPY WITH PROPOFOL;  Surgeon: Manya Silvas, MD;  Location: [facility] ENDOSCOPY;  Service: Endoscopy;  Laterality: N/A;  . CORRECTION HAMMER TOE Right   . ESOPHAGOGASTRODUODENOSCOPY (EGD) WITH PROPOFOL N/A 07/02/2015   Procedure: ESOPHAGOGASTRODUODENOSCOPY (EGD) WITH PROPOFOL;  Surgeon: Manya Silvas, MD;  Location: [facility] ENDOSCOPY;  Service: Endoscopy;  Laterality: N/A;  . JOINT REPLACEMENT Left    knee  . REPLACEMENT TOTAL KNEE Left   . SKIN BIOPSY Right     SOCIAL HISTORY:   Social History   Tobacco Use  . Smoking status: Never Smoker  . Smokeless tobacco: Never Used  Substance Use Topics  . Alcohol use: No    FAMILY HISTORY:   Family History  Problem Relation Age of Onset  . Hypertension Mother   . Diabetes Mother   . Heart attack Mother   . Asthma Mother   . Peripheral vascular disease Mother   . Heart attack Father   . Hypertension Father   . Diabetes Father     DRUG ALLERGIES:   Allergies  Allergen Reactions  . Ace Inhibitors Cough  . Amoxicillin  Rash    Has patient had a PCN reaction causing immediate rash, facial/tongue/throat swelling, SOB or lightheadedness with hypotension: No Has patient had a PCN reaction causing severe rash involving mucus membranes or skin necrosis: No Has patient had a PCN reaction that required hospitalization: No Has patient had a PCN reaction occurring within the last 10 years: No If all of the above answers are "NO", then may proceed with Cephalosporin use.   Marland Kitchen Benicar [Olmesartan] Rash    Itching rash  . Codeine Sulfate Rash  . Levaquin [Levofloxacin In D5w] Rash    Alters mental status  . Penicillin V Potassium Rash    Has patient had a  PCN reaction causing immediate rash, facial/tongue/throat swelling, SOB or lightheadedness with hypotension: No Has patient had a PCN reaction causing severe rash involving mucus membranes or skin necrosis: No Has patient had a PCN reaction that required hospitalization: No Has patient had a PCN reaction occurring within the last 10 years: No If all of the above answers are "NO", then may proceed with Cephalosporin use.   . Sulfa Antibiotics Rash    REVIEW OF SYSTEMS:   Review of Systems  Constitutional: Positive for malaise/fatigue. Negative for chills, fever and weight loss.  HENT: Negative for ear discharge, ear pain, hearing loss, nosebleeds and tinnitus.   Eyes: Negative for blurred vision, double vision and photophobia.  Respiratory: Negative for cough, hemoptysis, shortness of breath and wheezing.   Cardiovascular: Negative for chest pain, palpitations, orthopnea and leg swelling.  Gastrointestinal: Positive for abdominal pain, heartburn, nausea and vomiting. Negative for constipation, diarrhea and melena.  Genitourinary: Negative for dysuria, frequency, hematuria and urgency.  Musculoskeletal: Negative for back pain, myalgias and neck pain.  Skin: Negative for rash.  Neurological: Negative for dizziness, tingling, tremors, sensory change, speech change, focal weakness and headaches.  Endo/Heme/Allergies: Does not bruise/bleed easily.  Psychiatric/Behavioral: Negative for depression.    MEDICATIONS AT HOME:   Prior to Admission medications   Medication Sig Start Date End Date Taking? Authorizing Provider  amLODipine (NORVASC) 10 MG tablet Take 10 mg by mouth daily.   Yes [provider]  aspirin EC 81 MG tablet Take 81 mg by mouth daily.   Yes [provider]  aspirin EC 81 MG tablet Take 81 mg by mouth daily.   Yes [provider]  carvedilol (COREG) 12.5 MG tablet Take 12.5 mg by mouth 2 (two) times daily.   Yes [provider]   cholecalciferol (VITAMIN D3) 25 MCG (1000 UT) tablet Take 1,000 Units by mouth daily.   Yes [provider]  cyanocobalamin (,VITAMIN B-12,) 1000 MCG/ML injection Inject 1,000 mcg into the muscle every 30 (thirty) days.   Yes [provider]  doxycycline (VIBRA-TABS) 100 MG tablet Take 100 mg by mouth 2 (two) times daily. 06/23/19 07/03/19 Yes [provider]  isosorbide mononitrate (IMDUR) 30 MG 24 hr tablet Take 30 mg by mouth daily.   Yes [provider]  levothyroxine (SYNTHROID, LEVOTHROID) 50 MCG tablet Take 50 mcg by mouth daily before breakfast.   Yes [provider]  LORazepam (ATIVAN) 2 MG tablet Take 2 mg by mouth every 8 (eight) hours as needed for anxiety.    Yes [provider]  magnesium oxide (MAG-OX) 400 MG tablet Take 400 mg by mouth daily.   Yes [provider]  meloxicam (MOBIC) 15 MG tablet Take 15 mg by mouth daily.   Yes [provider]  metroNIDAZOLE (FLAGYL) 500 MG tablet  Take 500 mg by mouth 3 (three) times daily. 06/23/19 07/03/19 Yes [provider]  pantoprazole (PROTONIX) 40 MG tablet Take 40 mg by mouth daily.   Yes [provider]  Polyethyl Glycol-Propyl Glycol (SYSTANE OP) Place 1 drop into both eyes 2 (two) times daily.   Yes [provider]  potassium chloride SA (K-DUR,KLOR-CON) 20 MEQ tablet Take 20 mEq by mouth every morning.   Yes [provider]  pravastatin (PRAVACHOL) 40 MG tablet Take 40 mg by mouth at bedtime.    Yes [provider]  sertraline (ZOLOFT) 100 MG tablet Take 100 mg by mouth at bedtime.    Yes [provider]      VITAL SIGNS:  Blood pressure (!) 152/79, pulse 66, temperature 98.5 F (36.9 C), temperature source Oral, resp. rate 10, height 5' (1.524 m), weight 75.8 kg, SpO2 93 %.  PHYSICAL EXAMINATION:  Physical Exam  GENERAL:  78 y.o.-year-old patient lying in the bed, appears miserable due to constant nausea and  dry heaving EYES: Pupils equal, round, reactive to light and accommodation. No scleral icterus. Extraocular muscles intact.  HEENT: Head atraumatic, normocephalic. Oropharynx and nasopharynx clear.  NECK:  Supple, no jugular venous distention. No thyroid enlargement, no tenderness.  LUNGS: Normal breath sounds bilaterally, no wheezing, rales,rhonchi or crepitation. No use of accessory muscles of respiration.  Decreased bibasilar breath sounds CARDIOVASCULAR: S1, S2 normal. No murmurs, rubs, or gallops.  ABDOMEN: Soft, discomfort on palpation all over her abdomen but tender in the epigastric and right upper quadrants with no guarding or rigidity, nondistended. Bowel sounds present. No organomegaly or mass.  EXTREMITIES: No pedal edema, cyanosis, or clubbing.  NEUROLOGIC: Cranial nerves II through XII are intact. Muscle strength 5/5 in all extremities. Sensation intact. Gait not checked.  Generalized weakness noted PSYCHIATRIC: The patient is alert and oriented x 3.  SKIN: No obvious rash, lesion, or ulcer.   LABORATORY PANEL:   CBC Recent Labs  Lab 07/02/19 1435  WBC 9.1  HGB 12.4  HCT 38.9  PLT 254   ------------------------------------------------------------------------------------------------------------------  Chemistries  Recent Labs  Lab 07/02/19 1435  NA 138  K 3.3*  CL 102  CO2 24  GLUCOSE 114*  BUN 19  CREATININE 0.77  CALCIUM 9.8  MG 1.8  AST 23  ALT 10  ALKPHOS 47  BILITOT 0.7   ------------------------------------------------------------------------------------------------------------------  Cardiac Enzymes No results for input(s): TROPONINI in the last 168 hours. ------------------------------------------------------------------------------------------------------------------  RADIOLOGY:  Ct Head Wo Contrast  Result Date: 07/02/2019 CLINICAL DATA:  Two weeks of nausea with some nystagmus.  Dizziness. EXAM: CT HEAD WITHOUT CONTRAST TECHNIQUE: Contiguous  axial images were obtained from the base of the skull through the vertex without intravenous contrast. COMPARISON:  CT head dated June 30, 2018. FINDINGS: Brain: No evidence of acute infarction, hemorrhage, hydrocephalus, extra-axial collection or mass lesion/mass effect. Vascular: No hyperdense vessel or unexpected calcification. Skull: Normal. Negative for fracture or focal lesion. Sinuses/Orbits: No acute finding. Other: None. IMPRESSION: No acute intracranial abnormality. Electronically Signed   By: Constance Holster M.D.   On: 07/02/2019 17:02   Ct Abdomen Pelvis W Contrast  Result Date: 07/02/2019 CLINICAL DATA:  Prior diverticulitis with continued nausea. EXAM: CT ABDOMEN AND PELVIS WITH CONTRAST TECHNIQUE: Multidetector CT imaging of the abdomen and pelvis was performed using the standard protocol following bolus administration of intravenous contrast. CONTRAST:  113mL OMNIPAQUE IOHEXOL 300 MG/ML  SOLN COMPARISON:  November 08, 2015 FINDINGS: Lower chest: The lung bases are clear. The  heart size is normal. Hepatobiliary: The liver is normal. Normal gallbladder.There is no biliary ductal dilation. Pancreas: Normal contours without ductal dilatation. No peripancreatic fluid collection. Spleen: No splenic laceration or hematoma. Adrenals/Urinary Tract: --Adrenal glands: No adrenal hemorrhage. --Right kidney/ureter: No hydronephrosis or perinephric hematoma. --Left kidney/ureter: No hydronephrosis or perinephric hematoma. --Urinary bladder: There is bladder wall thickening despite the degree of underdistention. Stomach/Bowel: --Stomach/Duodenum: No hiatal hernia or other gastric abnormality. Normal duodenal course and caliber. --Small bowel: No dilatation or inflammation. --Colon: There is sigmoid diverticulosis with evidence of early sigmoid diverticulitis. --Appendix: Normal. Vascular/Lymphatic: Atherosclerotic calcification is present within the non-aneurysmal abdominal aorta, without hemodynamically  significant stenosis. --No retroperitoneal lymphadenopathy. --No mesenteric lymphadenopathy. --No pelvic or inguinal lymphadenopathy. Reproductive: Again noted is an abnormal appearance of the uterus favored to represent fibroids. Other: No ascites or free air. The abdominal wall is normal. Musculoskeletal. Multilevel degenerative disc disease and facet arthrosis. No bony spinal canal stenosis. IMPRESSION: 1. Sigmoid diverticulosis with evidence for early uncomplicated sigmoid diverticulitis. 2. Mild bladder wall thickening. Correlation with urinalysis is recommended to help exclude an underlying cystitis. Electronically Signed   By: Constance Holster M.D.   On: 07/02/2019 16:57      IMPRESSION AND PLAN:   Erin Sutton  is a 78 y.o. 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  1.  Intractable nausea vomiting and abdominal pain-symptoms started even before starting doxycycline. -However worsened reflux symptoms now.  Could be doxy induced esophageal ulcerations. -No other new medications.  Hold meloxicam as well -Discontinue antibiotics.  IV fluids and symptomatic treatment -GI consult for possible upper GI endoscopy. -N.p.o. after midnight.  IV Protonix twice daily ordered -COVID-19 test is pending  2.  Hypokalemia-being replaced  3.  Hypertension-continue home medications.  Patient on Norvasc and Coreg  4.  Hypothyroidism-on Synthroid  5.  DVT prophylaxis-Lovenox   All the records are reviewed and case discussed with ED provider. Management plans discussed with the patient, family and they are in agreement.  CODE STATUS: Full Code  TOTAL TIME TAKING CARE OF THIS PATIENT: 52 minutes.    Gladstone Lighter M.D on 07/02/2019 at 8:44 PM  Between 7am to  6pm - Pager - [phone]  After 6pm go to [email or web address] - Proofreader  [facility]  Office  [phone]  CC: Primary care physician; Baxter Hire, MD   Note: This dictation was prepared with Dragon dictation along with smaller phrase technology. Any transcriptional errors that result from this process are unintentional.

## 2019-07-02 NOTE — ED Notes (Signed)
ED TO INPATIENT HANDOFF REPORT  ED Nurse Name and Phone #: Renard Caperton [phone]   S Name/Age/Gender Erin Sutton 78 y.o. female Room/Bed: ED08A/ED08A  Code Status   Code Status: Prior  Home/SNF/Other Home Patient oriented to: self, place, time and situation Is this baseline? Yes   Triage Complete: Triage complete  Chief Complaint weak/vomit  Triage Note Pt arrives via EMS from home after having vomiting x2 weeks- has been seen at the ED is [place] twice and has seen her primary care dr once for this problem with no relief- pt states she is unable to keep anything down   Allergies Allergies  Allergen Reactions  . Ace Inhibitors Cough  . Amoxicillin Rash    Has patient had a PCN reaction causing immediate rash, facial/tongue/throat swelling, SOB or lightheadedness with hypotension: No Has patient had a PCN reaction causing severe rash involving mucus membranes or skin necrosis: No Has patient had a PCN reaction that required hospitalization: No Has patient had a PCN reaction occurring within the last 10 years: No If all of the above answers are "NO", then may proceed with Cephalosporin use.   Marland Kitchen Benicar [Olmesartan] Rash    Itching rash  . Codeine Sulfate Rash  . Levaquin [Levofloxacin In D5w] Rash    Alters mental status  . Penicillin V Potassium Rash    Has patient had a PCN reaction causing immediate rash, facial/tongue/throat swelling, SOB or lightheadedness with hypotension: No Has patient had a PCN reaction causing severe rash involving mucus membranes or skin necrosis: No Has patient had a PCN reaction that required hospitalization: No Has patient had a PCN reaction occurring within the last 10 years: No If all of the above answers are "NO", then may proceed with Cephalosporin use.   . Sulfa Antibiotics Rash    Level of Care/Admitting Diagnosis ED Disposition    ED Disposition Condition [facility]: [place] [[id number]]   Level of Care: Med-Surg [16]  Covid Evaluation: Asymptomatic Screening Protocol (No Symptoms)  Diagnosis: Intractable nausea and vomiting [366440]  Admitting Physician: Gladstone Lighter [[id number]]  Attending Physician: Gladstone Lighter [[id number]]  Estimated length of stay: past midnight tomorrow  Certification:: I certify this patient will need inpatient services for at least 2 midnights  PT Class (Do Not Modify): Inpatient [101]  PT Acc Code (Do Not Modify): Private [1]       B Medical/Surgery History Past Medical History:  Diagnosis Date  . Anxiety   . Arthritis   . Chronic kidney disease   . Constipation   . GERD (gastroesophageal reflux disease)   . Hypertension   . Hypothyroidism   . Osteoporosis   . Sleep apnea   . Urinary incontinence    Past Surgical History:  Procedure Laterality Date  . BREAST BIOPSY Right    neg/stereo  . BREAST BIOPSY Left    neg/stereo  . BREAST EXCISIONAL BIOPSY Right    neg  . COLONOSCOPY WITH PROPOFOL N/A 07/02/2015   Procedure: COLONOSCOPY WITH PROPOFOL;  Surgeon: Manya Silvas, MD;  Location: [facility] ENDOSCOPY;  Service: Endoscopy;  Laterality: N/A;  . CORRECTION HAMMER TOE Right   . ESOPHAGOGASTRODUODENOSCOPY (EGD) WITH PROPOFOL N/A 07/02/2015   Procedure: ESOPHAGOGASTRODUODENOSCOPY (EGD) WITH PROPOFOL;  Surgeon: Manya Silvas, MD;  Location: [facility] ENDOSCOPY;  Service: Endoscopy;  Laterality: N/A;  . JOINT REPLACEMENT Left    knee  . REPLACEMENT TOTAL KNEE Left   . SKIN BIOPSY Right  A IV Location/Drains/Wounds Patient Lines/Drains/Airways Status   Active Line/Drains/Airways    Name:   Placement date:   Placement time:   Site:   Days:   Peripheral IV 07/02/19 Posterior;Right Hand   07/02/19    1436    Hand   less than 1   Peripheral IV 07/02/19 Left Antecubital   07/02/19    1456    Antecubital   less than 1          Intake/Output Last 24 hours  Intake/Output Summary (Last 24 hours) at 07/02/2019 2018 Last data filed at  07/02/2019 1610 Gross per 24 hour  Intake 100 ml  Output -  Net 100 ml    Labs/Imaging Results for orders placed or performed during the hospital encounter of 07/02/19 (from the past 48 hour(s))  Lipase, blood     Status: None   Collection Time: 07/02/19  2:35 PM  Result Value Ref Range   Lipase 28 11 - 51 U/L    Comment: Performed at [facility] [place], [address] [place], [place] [address]  Comprehensive metabolic panel     Status: Abnormal   Collection Time: 07/02/19  2:35 PM  Result Value Ref Range   Sodium 138 135 - 145 mmol/L   Potassium 3.3 (L) 3.5 - 5.1 mmol/L   Chloride 102 98 - 111 mmol/L   CO2 24 22 - 32 mmol/L   Glucose, Bld 114 (H) 70 - 99 mg/dL   BUN 19 8 - 23 mg/dL   Creatinine, Ser 0.77 0.44 - 1.00 mg/dL   Calcium 9.8 8.9 - 10.3 mg/dL   Total Protein 7.4 6.5 - 8.1 g/dL   Albumin 4.3 3.5 - 5.0 g/dL   AST 23 15 - 41 U/L   ALT 10 0 - 44 U/L   Alkaline Phosphatase 47 38 - 126 U/L   Total Bilirubin 0.7 0.3 - 1.2 mg/dL   GFR calc non Af Amer >60 >60 mL/min   GFR calc Af Amer >60 >60 mL/min   Anion gap 12 5 - 15    Comment: Performed at [facility], [address] [place], [place] [address]  CBC     Status: Abnormal   Collection Time: 07/02/19  2:35 PM  Result Value Ref Range   WBC 9.1 4.0 - 10.5 K/uL   RBC 4.53 3.87 - 5.11 MIL/uL   Hemoglobin 12.4 12.0 - 15.0 g/dL   HCT 38.9 36.0 - 46.0 %   MCV 85.9 80.0 - 100.0 fL   MCH 27.4 26.0 - 34.0 pg   MCHC 31.9 30.0 - 36.0 g/dL   RDW 16.6 (H) 11.5 - 15.5 %   Platelets 254 150 - 400 K/uL   nRBC 0.0 0.0 - 0.2 %    Comment: Performed at [facility], [place], [place] [address]  Troponin I (High Sensitivity)     Status: None   Collection Time: 07/02/19  2:35 PM  Result Value Ref Range   Troponin I (High Sensitivity) 7 <18 ng/L    Comment: (NOTE) Elevated high sensitivity troponin I (hsTnI) values and significant  changes across serial measurements may suggest ACS but  many other  chronic and acute conditions are known to elevate hsTnI results.  Refer to the "Links" section for chest pain algorithms and additional  guidance. Performed at [facility], [address] [place], [place] [address]   Magnesium     Status: None   Collection Time: 07/02/19  2:35  PM  Result Value Ref Range   Magnesium 1.8 1.7 - 2.4 mg/dL    Comment: Performed at [facility], [address] [place], [place] [address]  T4, free     Status: Abnormal   Collection Time: 07/02/19  2:35 PM  Result Value Ref Range   Free T4 1.34 (H) 0.61 - 1.12 ng/dL    Comment: (NOTE) Biotin ingestion may interfere with free T4 tests. If the results are inconsistent with the TSH level, previous test results, or the clinical presentation, then consider biotin interference. If needed, order repeat testing after stopping biotin. Performed at [facility], [address] [place], [place] [address]   TSH     Status: None   Collection Time: 07/02/19  2:35 PM  Result Value Ref Range   TSH 1.745 0.350 - 4.500 uIU/mL    Comment: Performed by a 3rd Generation assay with a functional sensitivity of <=0.01 uIU/mL. Performed at [facility], [address] [place], [place] [address]   Urinalysis, Routine w reflex microscopic     Status: Abnormal   Collection Time: 07/02/19  3:14 PM  Result Value Ref Range   Color, Urine STRAW (A) YELLOW   APPearance CLEAR (A) CLEAR   Specific Gravity, Urine 1.025 1.005 - 1.030   pH 7.0 5.0 - 8.0   Glucose, UA NEGATIVE NEGATIVE mg/dL   Hgb urine dipstick SMALL (A) NEGATIVE   Bilirubin Urine NEGATIVE NEGATIVE   Ketones, ur 5 (A) NEGATIVE mg/dL   Protein, ur NEGATIVE NEGATIVE mg/dL   Nitrite NEGATIVE NEGATIVE   Leukocytes,Ua NEGATIVE NEGATIVE   RBC / HPF 0-5 0 - 5 RBC/hpf   WBC, UA 0-5 0 - 5 WBC/hpf   Bacteria, UA NONE SEEN NONE SEEN   Squamous Epithelial / LPF NONE SEEN 0 - 5    Comment: Performed at [facility], [address] [place], [place] [address]  SARS Coronavirus 2 [facility] order, Performed in [facility] hospital lab) Nasopharyngeal Nasopharyngeal Swab     Status: None   Collection Time: 07/02/19  3:53 PM   Specimen: Nasopharyngeal Swab  Result Value Ref Range   SARS Coronavirus 2 NEGATIVE NEGATIVE    Comment: (NOTE) If result is NEGATIVE SARS-CoV-2 target nucleic acids are NOT DETECTED. The SARS-CoV-2 RNA is generally detectable in upper and lower  respiratory specimens during the acute phase of infection. The lowest  concentration of SARS-CoV-2 viral copies this assay can detect is 250  copies / mL. A negative result does not preclude SARS-CoV-2 infection  and should not be used as the sole basis for treatment or other  patient management decisions.  A negative result may occur with  improper specimen collection / handling, submission of specimen other  than nasopharyngeal swab, presence of viral mutation(s) within the  areas targeted by this assay, and inadequate number of viral copies  (<250 copies / mL). A negative result must be combined with clinical  observations, patient history, and epidemiological information. If result is POSITIVE SARS-CoV-2 target nucleic acids are DETECTED. The SARS-CoV-2 RNA is generally detectable in upper and lower  respiratory specimens dur ing the acute phase of infection.  Positive  results are indicative of active infection with SARS-CoV-2.  Clinical  correlation with patient history and other diagnostic information is  necessary to determine patient infection status.  Positive results do  not rule out bacterial infection or co-infection with other viruses. If result is PRESUMPTIVE POSTIVE SARS-CoV-2 nucleic acids MAY BE PRESENT.  A presumptive positive result was obtained on the submitted specimen  and confirmed on repeat testing.  While 2019 novel coronavirus  (SARS-CoV-2) nucleic acids may be present in the submitted sample   additional confirmatory testing may be necessary for epidemiological  and / or clinical management purposes  to differentiate between  SARS-CoV-2 and other Sarbecovirus currently known to infect humans.  If clinically indicated additional testing with an alternate test  methodology [id number]) is advised. The SARS-CoV-2 RNA is generally  detectable in upper and lower respiratory sp ecimens during the acute  phase of infection. The expected result is Negative. Fact Sheet for Patients:  StrictlyIdeas.no Fact Sheet for Healthcare Providers: BankingDealers.co.za This test is not yet approved or cleared by the [place] FDA and has been authorized for detection and/or diagnosis of SARS-CoV-2 by FDA under an Emergency Use Authorization (EUA).  This EUA will remain in effect (meaning this test can be used) for the duration of the COVID-19 declaration under Section 564(b)(1) of the Act, 21 U.S.C. section 360bbb-3(b)(1), unless the authorization is terminated or revoked sooner. Performed at [facility], [place], [place] [address]   Troponin I (High Sensitivity)     Status: None   Collection Time: 07/02/19  5:14 PM  Result Value Ref Range   Troponin I (High Sensitivity) 8 <18 ng/L    Comment: (NOTE) Elevated high sensitivity troponin I (hsTnI) values and significant  changes across serial measurements may suggest ACS but many other  chronic and acute conditions are known to elevate hsTnI results.  Refer to the "Links" section for chest pain algorithms and additional  guidance. Performed at [facility], [place], [place] [address]    Ct Head Wo Contrast  Result Date: 07/02/2019 CLINICAL DATA:  Two weeks of nausea with some nystagmus.  Dizziness. EXAM: CT HEAD WITHOUT CONTRAST TECHNIQUE: Contiguous axial images were obtained from the base of the skull through the vertex without intravenous  contrast. COMPARISON:  CT head dated June 30, 2018. FINDINGS: Brain: No evidence of acute infarction, hemorrhage, hydrocephalus, extra-axial collection or mass lesion/mass effect. Vascular: No hyperdense vessel or unexpected calcification. Skull: Normal. Negative for fracture or focal lesion. Sinuses/Orbits: No acute finding. Other: None. IMPRESSION: No acute intracranial abnormality. Electronically Signed   By: Constance Holster M.D.   On: 07/02/2019 17:02   Ct Abdomen Pelvis W Contrast  Result Date: 07/02/2019 CLINICAL DATA:  Prior diverticulitis with continued nausea. EXAM: CT ABDOMEN AND PELVIS WITH CONTRAST TECHNIQUE: Multidetector CT imaging of the abdomen and pelvis was performed using the standard protocol following bolus administration of intravenous contrast. CONTRAST:  173mL OMNIPAQUE IOHEXOL 300 MG/ML  SOLN COMPARISON:  November 08, 2015 FINDINGS: Lower chest: The lung bases are clear. The heart size is normal. Hepatobiliary: The liver is normal. Normal gallbladder.There is no biliary ductal dilation. Pancreas: Normal contours without ductal dilatation. No peripancreatic fluid collection. Spleen: No splenic laceration or hematoma. Adrenals/Urinary Tract: --Adrenal glands: No adrenal hemorrhage. --Right kidney/ureter: No hydronephrosis or perinephric hematoma. --Left kidney/ureter: No hydronephrosis or perinephric hematoma. --Urinary bladder: There is bladder wall thickening despite the degree of underdistention. Stomach/Bowel: --Stomach/Duodenum: No hiatal hernia or other gastric abnormality. Normal duodenal course and caliber. --Small bowel: No dilatation or inflammation. --Colon: There is sigmoid diverticulosis with evidence of early sigmoid diverticulitis. --Appendix: Normal. Vascular/Lymphatic: Atherosclerotic calcification is present within the non-aneurysmal abdominal aorta, without hemodynamically significant stenosis. --No retroperitoneal lymphadenopathy. --No mesenteric lymphadenopathy.  --No pelvic or inguinal lymphadenopathy. Reproductive: Again noted  is an abnormal appearance of the uterus favored to represent fibroids. Other: No ascites or free air. The abdominal wall is normal. Musculoskeletal. Multilevel degenerative disc disease and facet arthrosis. No bony spinal canal stenosis. IMPRESSION: 1. Sigmoid diverticulosis with evidence for early uncomplicated sigmoid diverticulitis. 2. Mild bladder wall thickening. Correlation with urinalysis is recommended to help exclude an underlying cystitis. Electronically Signed   By: Constance Holster M.D.   On: 07/02/2019 16:57    Pending Labs [employer] (From admission, onward)    Start     Ordered   Signed and Occupational hygienist morning,   R     Signed and Held   Signed and Held  CBC  Tomorrow morning,   R     Signed and Held          Vitals/Pain Today's Vitals   07/02/19 1800 07/02/19 1830 07/02/19 1900 07/02/19 1930  BP: (!) 152/81 136/76 (!) 142/75 (!) 162/80  Pulse: 77 70 75 76  Resp: 15 11 15 13   Temp:      TempSrc:      SpO2: 95% 98% 97% 97%  Weight:      Height:      PainSc:        Isolation Precautions No active isolations  Medications Medications  potassium chloride 10 mEq in 100 mL IVPB (0 mEq Intravenous Stopped 07/02/19 1834)  amLODipine (NORVASC) tablet 10 mg (10 mg Oral Given 07/02/19 1730)  carvedilol (COREG) tablet 12.5 mg (12.5 mg Oral Given 07/02/19 1730)  iohexol (OMNIPAQUE) 300 MG/ML solution 100 mL (100 mLs Intravenous Contrast Given 07/02/19 1622)  LORazepam (ATIVAN) injection 0.5 mg (0.5 mg Intravenous Given 07/02/19 1934)    Mobility walks with person assist Low fall risk   Focused Assessments GI   R Recommendations: See Admitting Provider Note  Report given to:   Additional Notes:

## 2019-07-02 NOTE — ED Notes (Signed)
Report from [place], rn.

## 2019-07-02 NOTE — ED Notes (Signed)
Pure wick in place 

## 2019-07-02 NOTE — ED Notes (Signed)
Pt taken to CT via stretcher.

## 2019-07-02 NOTE — ED Notes (Signed)
Pt talking to MRI on the phone for screening.  

## 2019-07-02 NOTE — Progress Notes (Signed)
   [place] at [facility] Day: 0 days Erin Sutton is a 78 y.o. female presenting with Emesis .   Advance care planning discussed with patient  at bedside.  Patient is alert and oriented and was able to participate in the conversation. She has chronic medical problems including hypertension, GERD, arthritis, history of hyperparathyroidism status post parathyroid surgery and thyroid surgery, CKD who lives at home independently Admitted for intractable nausea vomiting for almost 2 weeks duration now. All questions in regards to overall condition and expected prognosis answered. The decision was made to continue  current code status.  She would like to be resuscitated.  CODE STATUS: Full Code Time spent: 18 minutes

## 2019-07-02 NOTE — ED Provider Notes (Signed)
[facility] Emergency Department Provider Note  ____________________________________________   First MD Initiated Contact with Patient 07/02/19 1459     (approximate)  I have reviewed the triage vital signs and the nursing notes.   HISTORY  Chief Complaint Emesis    HPI Erin Sutton is a 78 y.o. female with anxiety, CKD, hypertension, hypothyroidism who presents for vomiting x2 weeks.  Patient has been seen in the ED in hospital twice as well as by her primary care doctor without any relief in symptoms.  Patient said that they did a CT scan that showed diverticulitis.  She has about 4 days left of medications.  She is been tolerating the medication however is not able to eat anything else due to feeling severe nausea that is intermittent, nothing makes it better including 8 mg of Zofran every 8 hours, worse with trying to eat.  She endorses some continued lower abdominal pain.  Patient endorses having some intermittent headaches as well.  Patient ambulates with a cane.          Past Medical History:  Diagnosis Date  . Anxiety   . Arthritis   . Chronic kidney disease   . Constipation   . GERD (gastroesophageal reflux disease)   . Hypertension   . Hypothyroidism   . Osteoporosis   . Sleep apnea   . Urinary incontinence     Patient Active Problem List   Diagnosis Date Noted  . Confusion 06/30/2018  . Hypothyroidism 06/30/2018  . HTN (hypertension) 06/30/2018  . GERD (gastroesophageal reflux disease) 06/30/2018  . Anxiety 06/30/2018  . Accelerated hypertension 06/30/2018  . Sepsis ([place]) 12/17/2016    Past Surgical History:  Procedure Laterality Date  . BREAST BIOPSY Right    neg/stereo  . BREAST BIOPSY Left    neg/stereo  . BREAST EXCISIONAL BIOPSY Right    neg  . COLONOSCOPY WITH PROPOFOL N/A 07/02/2015   Procedure: COLONOSCOPY WITH PROPOFOL;  Surgeon: Manya Silvas, MD;  Location: [facility] ENDOSCOPY;  Service: Endoscopy;  Laterality:  N/A;  . CORRECTION HAMMER TOE Right   . ESOPHAGOGASTRODUODENOSCOPY (EGD) WITH PROPOFOL N/A 07/02/2015   Procedure: ESOPHAGOGASTRODUODENOSCOPY (EGD) WITH PROPOFOL;  Surgeon: Manya Silvas, MD;  Location: [facility] ENDOSCOPY;  Service: Endoscopy;  Laterality: N/A;  . JOINT REPLACEMENT Left    knee  . REPLACEMENT TOTAL KNEE Left   . SKIN BIOPSY Right     Prior to Admission medications   Medication Sig Start Date End Date Taking? Authorizing Provider  amLODipine (NORVASC) 10 MG tablet Take 10 mg by mouth daily.    [provider]  aspirin EC 81 MG tablet Take 81 mg by mouth daily.    [provider]  carvedilol (COREG) 3.125 MG tablet Take 4 tablets (12.5 mg total) by mouth 2 (two) times daily with a meal. 07/03/18   Vaughan Basta, MD  Cholecalciferol (VITAMIN D PO) Take 1 tablet by mouth daily.    [provider]  isosorbide mononitrate (IMDUR) 30 MG 24 hr tablet Take 30 mg by mouth daily.    [provider]  levothyroxine (SYNTHROID, LEVOTHROID) 50 MCG tablet Take 50 mcg by mouth daily before breakfast.    [provider]  LORazepam (ATIVAN) 2 MG tablet Take 2 mg by mouth every 8 (eight) hours as needed for anxiety.     [provider]  magnesium oxide (MAG-OX) 400 MG tablet Take 400 mg by mouth daily.    [provider]  pantoprazole ([place])  40 MG tablet Take 40 mg by mouth daily.    [provider]  Polyethyl Glycol-Propyl Glycol (SYSTANE OP) Place 1 drop into both eyes 2 (two) times daily.    [provider]  potassium chloride SA (K-DUR,KLOR-CON) 20 MEQ tablet Take 20 mEq by mouth every morning.    [provider]  pravastatin (PRAVACHOL) 40 MG tablet Take 40 mg by mouth at bedtime.     [provider]  sertraline (ZOLOFT) 50 MG tablet Take 50 mg by mouth at bedtime.     [provider]    Allergies Ace inhibitors, Amoxicillin, Benicar [olmesartan], Codeine sulfate,  Levaquin [levofloxacin in d5w], Penicillin v potassium, and Sulfa antibiotics  Family History  Problem Relation Age of Onset  . Hypertension Mother   . Diabetes Mother   . Heart attack Mother   . Asthma Mother   . Peripheral vascular disease Mother   . Heart attack Father   . Hypertension Father   . Diabetes Father     Social History Social History   Tobacco Use  . Smoking status: Never Smoker  . Smokeless tobacco: Never Used  Substance Use Topics  . Alcohol use: No  . Drug use: No      Review of Systems Constitutional: No fever/chills Eyes: No visual changes. ENT: No sore throat. Cardiovascular: Positive chest pain Respiratory: Denies shortness of breath. Gastrointestinal: Positive abdominal pain, nausea and vomiting. Genitourinary: Negative for dysuria.  Positive for urinary incontinence  Musculoskeletal: Negative for back pain. Skin: Negative for rash. Neurological: Positive for headache however negative for focal weakness or numbness. All other ROS negative ____________________________________________   PHYSICAL EXAM:  VITAL SIGNS: ED Triage Vitals  Enc Vitals Group     BP 07/02/19 1431 (!) 204/98     Pulse Rate 07/02/19 1431 82     Resp 07/02/19 1431 20     Temp 07/02/19 1431 98.5 F (36.9 C)     Temp Source 07/02/19 1431 Oral     SpO2 07/02/19 1431 96 %     Weight 07/02/19 1433 167 lb (75.8 kg)     Height 07/02/19 1433 5' (1.524 m)     Head Circumference --      Peak Flow --      Pain Score 07/02/19 1432 8     Pain Loc --      Pain Edu? --      Excl. in [place]? --     Constitutional: Alert and oriented.  Chronically ill-appearing Eyes: Conjunctivae are normal. EOMI. nystagmus bilaterally Head: Atraumatic. Nose: No congestion/rhinnorhea. Mouth/Throat: Mucous membranes are moist.   Neck: No stridor. Trachea Midline. FROM Cardiovascular: Normal rate, regular rhythm. Grossly normal heart sounds.  Good peripheral circulation. Respiratory: Normal  respiratory effort.  No retractions. Lungs CTAB. Gastrointestinal: Tender in the lower abdomen.  No distention. No abdominal bruits.  Musculoskeletal: No lower extremity tenderness nor edema.  No joint effusions. Neurologic:  Normal speech and language. No gross focal neurologic deficits are appreciated.  Finger-to-nose worse on the left.  Equal strength bilaterally Skin:  Skin is warm, dry and intact. No rash noted. Psychiatric: Mood and affect are normal. Speech and behavior are normal. GU: Deferred   ____________________________________________   LABS (all labs ordered are listed, but only abnormal results are displayed)  Labs Reviewed  COMPREHENSIVE METABOLIC PANEL - Abnormal; Notable for the following components:      Result Value   Potassium 3.3 (*)    Glucose, Bld 114 (*)  All other components within normal limits  CBC - Abnormal; Notable for the following components:   RDW 16.6 (*)    All other components within normal limits  URINALYSIS, ROUTINE W REFLEX MICROSCOPIC - Abnormal; Notable for the following components:   Color, Urine STRAW (*)    APPearance CLEAR (*)    Hgb urine dipstick SMALL (*)    Ketones, ur 5 (*)    All other components within normal limits  T4, FREE - Abnormal; Notable for the following components:   Free T4 1.34 (*)    All other components within normal limits  SARS CORONAVIRUS 2 (HOSPITAL ORDER, [place] [facility])  LIPASE, BLOOD  MAGNESIUM  TSH  TROPONIN I (HIGH SENSITIVITY)  TROPONIN I (HIGH SENSITIVITY)   ____________________________________________   ED ECG REPORT I, Vanessa Levant, the attending physician, personally viewed and interpreted this ECG.  EKG sinus rate of 81, no ST elevation, T wave inversion in lead V2, right bundle branch block.  QTC is 490 This looks similar to prior EKG.    ____________________________________________  RADIOLOGY   Official radiology report(s): Ct Head Wo Contrast  Result  Date: 07/02/2019 CLINICAL DATA:  Two weeks of nausea with some nystagmus.  Dizziness. EXAM: CT HEAD WITHOUT CONTRAST TECHNIQUE: Contiguous axial images were obtained from the base of the skull through the vertex without intravenous contrast. COMPARISON:  CT head dated June 30, 2018. FINDINGS: Brain: No evidence of acute infarction, hemorrhage, hydrocephalus, extra-axial collection or mass lesion/mass effect. Vascular: No hyperdense vessel or unexpected calcification. Skull: Normal. Negative for fracture or focal lesion. Sinuses/Orbits: No acute finding. Other: None. IMPRESSION: No acute intracranial abnormality. Electronically Signed   By: Constance Holster M.D.   On: 07/02/2019 17:02   Ct Abdomen Pelvis W Contrast  Result Date: 07/02/2019 CLINICAL DATA:  Prior diverticulitis with continued nausea. EXAM: CT ABDOMEN AND PELVIS WITH CONTRAST TECHNIQUE: Multidetector CT imaging of the abdomen and pelvis was performed using the standard protocol following bolus administration of intravenous contrast. CONTRAST:  197mL OMNIPAQUE IOHEXOL 300 MG/ML  SOLN COMPARISON:  November 08, 2015 FINDINGS: Lower chest: The lung bases are clear. The heart size is normal. Hepatobiliary: The liver is normal. Normal gallbladder.There is no biliary ductal dilation. Pancreas: Normal contours without ductal dilatation. No peripancreatic fluid collection. Spleen: No splenic laceration or hematoma. Adrenals/Urinary Tract: --Adrenal glands: No adrenal hemorrhage. --Right kidney/ureter: No hydronephrosis or perinephric hematoma. --Left kidney/ureter: No hydronephrosis or perinephric hematoma. --Urinary bladder: There is bladder wall thickening despite the degree of underdistention. Stomach/Bowel: --Stomach/Duodenum: No hiatal hernia or other gastric abnormality. Normal duodenal course and caliber. --Small bowel: No dilatation or inflammation. --Colon: There is sigmoid diverticulosis with evidence of early sigmoid diverticulitis. --Appendix:  Normal. Vascular/Lymphatic: Atherosclerotic calcification is present within the non-aneurysmal abdominal aorta, without hemodynamically significant stenosis. --No retroperitoneal lymphadenopathy. --No mesenteric lymphadenopathy. --No pelvic or inguinal lymphadenopathy. Reproductive: Again noted is an abnormal appearance of the uterus favored to represent fibroids. Other: No ascites or free air. The abdominal wall is normal. Musculoskeletal. Multilevel degenerative disc disease and facet arthrosis. No bony spinal canal stenosis. IMPRESSION: 1. Sigmoid diverticulosis with evidence for early uncomplicated sigmoid diverticulitis. 2. Mild bladder wall thickening. Correlation with urinalysis is recommended to help exclude an underlying cystitis. Electronically Signed   By: Constance Holster M.D.   On: 07/02/2019 16:57    ____________________________________________   PROCEDURES  Procedure(s) performed (including Critical Care):  Procedures   ____________________________________________   INITIAL IMPRESSION / ASSESSMENT AND PLAN /  ED COURSE  ARICKA GOLDBERGER was evaluated in Emergency Department on 07/02/2019 for the symptoms described in the history of present illness. She was evaluated in the context of the global COVID-19 pandemic, which necessitated consideration that the patient might be at risk for infection with the SARS-CoV-2 virus that causes COVID-19. Institutional protocols and algorithms that pertain to the evaluation of patients at risk for COVID-19 are in a state of rapid change based on information released by regulatory bodies including the CDC and federal and state organizations. These policies and algorithms were followed during the patient's care in the ED.    Patient is a 78 year old who presents with 2 weeks of nausea.  Patient is currently getting treatment for diverticulitis without any improvement in symptoms.  Patient endorses intermittent chest pain, abdominal pain, headaches.   Will get CT head to evaluate for intracranial hemorrhage or mass.  Will repeat CT abdomen to look for worsening diverticulitis complications such as abscess or perforation.  Will get cardiac markers to evaluate for ACS and thyroid labs to evaluate for hypothyroidism.  Also get urine to evaluate for possible UTI.  Labs notable for a K of 3.3 will give 20 IV K.   4:11 PM pt had some blood specs in the sputum.  Hemoglobin is 12.4.  This is around patient's baseline.  Troponin is 7.  Free T4 is slightly elevated at 1.34.  5:09 PM CT scan is concerning for possible cystitis.  Will get straight cath to evaluate patient's urine.  Patient is now having a headache and says she did not take her blood pressure medications this morning.  We will give her her medications.  Patient CT head was negative.  Patient had a prior MRI that shown strokes.  Given patient's poor finger-nose on the left and her nystagmus had concern that she could be having a posterior stroke therefore I added on a MRI.  Coronavirus testing is negative.  Cardiac markers are stable.  UA negative.  Another discussion with patient she lives by herself and does not feel comfortable with going home due to the continued nausea and weakness.  Discussed with hospital team for admission.  Patient has an MRI that is been ordered but may need IV antibiotics for her diverticulitis and continued monitoring for her nausea.    Admit to the hospital team.    ____________________________________________   FINAL CLINICAL IMPRESSION(S) / ED DIAGNOSES   Final diagnoses:  Nausea  Hypokalemia      MEDICATIONS GIVEN DURING THIS VISIT:  Medications  potassium chloride 10 mEq in 100 mL IVPB (0 mEq Intravenous Stopped 07/02/19 1834)  amLODipine (NORVASC) tablet 10 mg (10 mg Oral Given 07/02/19 1730)  carvedilol (COREG) tablet 12.5 mg (12.5 mg Oral Given 07/02/19 1730)  LORazepam (ATIVAN) injection 0.5 mg (has no administration in time range)   iohexol (OMNIPAQUE) 300 MG/ML solution 100 mL (100 mLs Intravenous Contrast Given 07/02/19 1622)     ED Discharge Orders    None       Note:  This document was prepared using Dragon voice recognition software and may include unintentional dictation errors.   Vanessa [place], MD 07/02/19 [phone]

## 2019-07-03 DIAGNOSIS — R112 Nausea with vomiting, unspecified: Secondary | ICD-10-CM

## 2019-07-03 DIAGNOSIS — K5732 Diverticulitis of large intestine without perforation or abscess without bleeding: Principal | ICD-10-CM

## 2019-07-03 DIAGNOSIS — N3289 Other specified disorders of bladder: Secondary | ICD-10-CM

## 2019-07-03 LAB — BASIC METABOLIC PANEL
Anion gap: 6 (ref 5–15)
BUN: 16 mg/dL (ref 8–23)
CO2: 27 mmol/L (ref 22–32)
Calcium: 8.9 mg/dL (ref 8.9–10.3)
Chloride: 105 mmol/L (ref 98–111)
Creatinine, Ser: 0.92 mg/dL (ref 0.44–1.00)
GFR calc Af Amer: >60 mL/min (ref >60)
GFR calc non Af Amer: >60 mL/min (ref >60)
Glucose, Bld: 97 mg/dL (ref 70–99)
Potassium: 3.6 mmol/L (ref 3.5–5.1)
Sodium: 138 mmol/L (ref 135–145)

## 2019-07-03 LAB — CBC
HCT: 33.9 % — ABNORMAL LOW (ref 36.0–46.0)
Hemoglobin: 10.8 g/dL — ABNORMAL LOW (ref 12.0–15.0)
MCH: 27.6 pg (ref 26.0–34.0)
MCHC: 31.9 g/dL (ref 30.0–36.0)
MCV: 86.7 fL (ref 80.0–100.0)
Platelets: 230 10*3/uL (ref 150–400)
RBC: 3.91 MIL/uL (ref 3.87–5.11)
RDW: 16.9 % — ABNORMAL HIGH (ref 11.5–15.5)
WBC: 9.7 10*3/uL (ref 4.0–10.5)
nRBC: 0 % (ref 0.0–0.2)

## 2019-07-03 MED ORDER — ONDANSETRON HCL 4 MG/2ML IJ SOLN: 4.0000 mg | Freq: Four times a day (QID) | INTRAMUSCULAR | Status: DC | PRN

## 2019-07-03 MED ORDER — SODIUM CHLORIDE 0.9 % IV SOLN
2.0000 g | INTRAVENOUS | Status: DC
  Administered 2019-07-03 – 2019-07-06 (×4): 2 g via INTRAVENOUS
  Filled 2019-07-03 (×4): qty 2

## 2019-07-03 MED ORDER — POLYVINYL ALCOHOL 1.4 % OP SOLN
1.0000 [drp] | OPHTHALMIC | Status: DC | PRN
  Filled 2019-07-03 (×2): qty 15

## 2019-07-03 MED ORDER — PANTOPRAZOLE SODIUM 40 MG PO TBEC
40.0000 mg | DELAYED_RELEASE_TABLET | Freq: Every day | ORAL | Status: DC
  Administered 2019-07-03 – 2019-07-06 (×4): 40 mg via ORAL
  Filled 2019-07-03 (×4): qty 1

## 2019-07-03 MED ORDER — SODIUM CHLORIDE 0.9 % IV SOLN: 1.0000 g | INTRAVENOUS | Status: DC

## 2019-07-03 MED ORDER — METRONIDAZOLE IN NACL 5-0.79 MG/ML-% IV SOLN
500.0000 mg | Freq: Three times a day (TID) | INTRAVENOUS | Status: DC
  Administered 2019-07-03 – 2019-07-06 (×9): 500 mg via INTRAVENOUS
  Filled 2019-07-03 (×12): qty 100

## 2019-07-03 NOTE — Progress Notes (Signed)
PHARMACY NOTE:  ANTIMICROBIAL DOSAGE ADJUSTMENT  Current antimicrobial regimen includes a mismatch between antimicrobial dosage and indication.  As per policy approved by the Pharmacy & Therapeutics and Medical Executive Committees, the antimicrobial dosage will be adjusted accordingly.  Current antimicrobial dosage:  Ceftriaxone 1g IV q24h  Indication: Intra-abdominal    Antimicrobial dosage has been changed to:  Ceftriaxone 2g IV q24h  Additional comments:   Thank you for allowing pharmacy to be a part of this patient's care.  Paulina Fusi, PharmD, BCPS 07/03/2019 9:50 AM

## 2019-07-03 NOTE — Consult Note (Signed)
Erin Antigua, MD [address] [place], [place], [place], [address] [address] [place], [place], [place], [address] Phone: [phone]  Fax: [phone]  Consultation  Referring Provider:     Dr. Tressia Miners Primary Care Physician:  Baxter Hire, MD Reason for Consultation:     Nausea and vomiting  Date of Admission:  07/02/2019 Date of Consultation:  07/03/2019         HPI:   Erin Sutton is a 78 y.o. female with history of recent diverticulitis, admitted with nausea and vomiting.  Patient had a CT scan on July 30, available in care everywhere that showed sigmoid diverticulitis.  Patient has been on outpatient antibiotics, with repeat CT scan on August 4 done due to persistent abdominal pain did not showing diverticulitis.  However, patient reports continued abdominal pain and nausea vomiting and GI was consulted for further evaluation.  CT scan on this admission, done yesterday, showed sigmoid diverticulitis.  Patient denies any blood in stool or diarrhea.  Patient reports ongoing symptoms for 2 to 4 weeks.  Bilateral lower quadrant abdominal pain, dull, 5/10, nonradiating.  No fever.  Colonoscopy in 2016 with Dr. Vira Agar, with sigmoid diverticulosis reported.  3 subcentimeter polyps removed.  Pathology showing tubular adenoma.  EGD, 2016 with Dr. Vira Agar, with hiatal hernia and gastric erythema reported.  Pathology negative for H. pylori.  Past Medical History:  Diagnosis Date   Anxiety    Arthritis    Chronic kidney disease    Constipation    GERD (gastroesophageal reflux disease)    Hypertension    Hypothyroidism    Osteoporosis    Sleep apnea    Urinary incontinence     Past Surgical History:  Procedure Laterality Date   BREAST BIOPSY Right    neg/stereo   BREAST BIOPSY Left    neg/stereo   BREAST EXCISIONAL BIOPSY Right    neg   COLONOSCOPY WITH PROPOFOL N/A 07/02/2015   Procedure: COLONOSCOPY WITH PROPOFOL;  Surgeon: Manya Silvas, MD;  Location: [facility] ENDOSCOPY;  Service: Endoscopy;  Laterality: N/A;   CORRECTION HAMMER TOE Right    ESOPHAGOGASTRODUODENOSCOPY (EGD) WITH PROPOFOL N/A 07/02/2015   Procedure: ESOPHAGOGASTRODUODENOSCOPY (EGD) WITH PROPOFOL;  Surgeon: Manya Silvas, MD;  Location: [facility] ENDOSCOPY;  Service: Endoscopy;  Laterality: N/A;   JOINT REPLACEMENT Left    knee   REPLACEMENT TOTAL KNEE Left    SKIN BIOPSY Right     Prior to Admission medications   Medication Sig Start Date End Date Taking? Authorizing Provider  amLODipine (NORVASC) 10 MG tablet Take 10 mg by mouth daily.   Yes [provider]  aspirin EC 81 MG tablet Take 81 mg by mouth daily.   Yes [provider]  aspirin EC 81 MG tablet Take 81 mg by mouth daily.   Yes [provider]  carvedilol (COREG) 12.5 MG tablet Take 12.5 mg by mouth 2 (two) times daily.   Yes [provider]  cholecalciferol (VITAMIN D3) 25 MCG (1000 UT) tablet Take 1,000 Units by mouth daily.   Yes [provider]  cyanocobalamin (,VITAMIN B-12,) 1000 MCG/ML injection Inject 1,000 mcg into the muscle every 30 (thirty) days.   Yes [provider]  doxycycline (VIBRA-TABS) 100 MG tablet Take 100 mg by mouth 2 (two) times daily. 06/23/19 07/03/19 Yes [provider]  isosorbide mononitrate (IMDUR) 30 MG 24 hr tablet Take 30 mg by mouth daily.   Yes [provider]  levothyroxine (SYNTHROID,  LEVOTHROID) 50 MCG tablet Take 50 mcg by mouth daily before breakfast.   Yes [provider]  LORazepam (ATIVAN) 2 MG tablet Take 2 mg by mouth every 8 (eight) hours as needed for anxiety.    Yes [provider]  magnesium oxide (MAG-OX) 400 MG tablet Take 400 mg by mouth daily.   Yes [provider]  meloxicam (MOBIC) 15 MG tablet Take 15 mg by mouth daily.   Yes [provider]  metroNIDAZOLE (FLAGYL) 500 MG tablet Take 500 mg by mouth 3 (three) times daily. 06/23/19  07/03/19 Yes [provider]  pantoprazole (PROTONIX) 40 MG tablet Take 40 mg by mouth daily.   Yes [provider]  Polyethyl Glycol-Propyl Glycol (SYSTANE OP) Place 1 drop into both eyes 2 (two) times daily.   Yes [provider]  potassium chloride SA (K-DUR,KLOR-CON) 20 MEQ tablet Take 20 mEq by mouth every morning.   Yes [provider]  pravastatin (PRAVACHOL) 40 MG tablet Take 40 mg by mouth at bedtime.    Yes [provider]  sertraline (ZOLOFT) 100 MG tablet Take 100 mg by mouth at bedtime.    Yes [provider]    Family History  Problem Relation Age of Onset   Hypertension Mother    Diabetes Mother    Heart attack Mother    Asthma Mother    Peripheral vascular disease Mother    Heart attack Father    Hypertension Father    Diabetes Father      Social History   Tobacco Use   Smoking status: Never Smoker   Smokeless tobacco: Never Used  Substance Use Topics   Alcohol use: No   Drug use: No    Allergies as of 07/02/2019 - Review Complete 07/02/2019  Allergen Reaction Noted   Ace inhibitors Cough 06/29/2015   Amoxicillin Rash 06/29/2015   Benicar [olmesartan] Rash 06/29/2015   Codeine sulfate Rash 06/29/2015   Levaquin [levofloxacin in d5w] Rash 06/29/2015   Penicillin v potassium Rash 06/29/2015   Sulfa antibiotics Rash 06/29/2015    Review of Systems:    All systems reviewed and negative except where noted in HPI.   Physical Exam:  Vital signs in last 24 hours: Vitals:   07/02/19 2213 07/02/19 2331 07/03/19 0415 07/03/19 0837  BP: (!) 193/89 130/71 133/74 (!) 164/82  Pulse: 82 70 73 80  Resp: 17  16   Temp: 99.2 F (37.3 C)  98.8 F (37.1 C) 98 F (36.7 C)  TempSrc:    Oral  SpO2: 99%  92% 96%  Weight:      Height:       Last BM Date: 07/02/19 General:   Pleasant, cooperative in NAD Head:  Normocephalic and atraumatic. Eyes:   No icterus.   Conjunctiva pink. PERRLA. Ears:   Normal auditory acuity. Neck:  Supple; no masses or thyroidomegaly Lungs: Respirations even and unlabored. Lungs clear to auscultation bilaterally.   No wheezes, crackles, or rhonchi.  Abdomen:  Soft, nondistended, nontender. Normal bowel sounds. No appreciable masses or hepatomegaly.  No rebound or guarding.  Neurologic:  Alert and oriented x3;  grossly normal neurologically. Skin:  Intact without significant lesions or rashes. Cervical Nodes:  No significant cervical adenopathy. Psych:  Alert and cooperative. Normal affect.  LAB RESULTS: Recent Labs    07/02/19 1435 07/03/19 0500  WBC 9.1 9.7  HGB 12.4 10.8*  HCT 38.9 33.9*  PLT 254 230   BMET Recent Labs  07/02/19 1435 07/03/19 0500  NA 138 138  K 3.3* 3.6  CL 102 105  CO2 24 27  GLUCOSE 114* 97  BUN 19 16  CREATININE 0.77 0.92  CALCIUM 9.8 8.9   LFT Recent Labs    07/02/19 1435  PROT 7.4  ALBUMIN 4.3  AST 23  ALT 10  ALKPHOS 47  BILITOT 0.7   PT/INR No results for input(s): LABPROT, INR in the last 72 hours.  STUDIES: Ct Head Wo Contrast  Result Date: 07/02/2019 CLINICAL DATA:  Two weeks of nausea with some nystagmus.  Dizziness. EXAM: CT HEAD WITHOUT CONTRAST TECHNIQUE: Contiguous axial images were obtained from the base of the skull through the vertex without intravenous contrast. COMPARISON:  CT head dated June 30, 2018. FINDINGS: Brain: No evidence of acute infarction, hemorrhage, hydrocephalus, extra-axial collection or mass lesion/mass effect. Vascular: No hyperdense vessel or unexpected calcification. Skull: Normal. Negative for fracture or focal lesion. Sinuses/Orbits: No acute finding. Other: None. IMPRESSION: No acute intracranial abnormality. Electronically Signed   By: Constance Holster M.D.   On: 07/02/2019 17:02   Mr Brain Wo Contrast  Result Date: 07/02/2019 CLINICAL DATA:  Initial evaluation for acute nausea. EXAM: MRI HEAD WITHOUT CONTRAST TECHNIQUE: Multiplanar, multiecho pulse sequences  of the brain and surrounding structures were obtained without intravenous contrast. COMPARISON:  Prior head CT from earlier the same day. FINDINGS: Brain: Cerebral volume within normal limits. No significant cerebral white matter disease for age. Small focus of encephalomalacia and gliosis involving the parasagittal left parieto-occipital region compatible with chronic ischemic infarct. Additional tiny remote lacunar infarcts noted involving the bilateral thalami. No abnormal foci of restricted diffusion to suggest acute or subacute ischemia. Gray-white matter differentiation otherwise maintained. No other areas of remote cortical infarction. No acute intracranial hemorrhage. Single chronic microhemorrhage noted at the anterior left frontal region, of doubtful significance in isolation. No mass lesion, midline shift or mass effect. No hydrocephalus. No extra-axial fluid collection. Pituitary gland suprasellar region normal. Midline structures intact. Vascular: Major intracranial vascular flow voids are maintained. Skull and upper cervical spine: Craniocervical junction within normal limits. Upper cervical spine normal. Bone marrow signal intensity within normal limits. No scalp soft tissue abnormality. Sinuses/Orbits: Patient status post bilateral ocular lens replacement. Globes and orbital soft tissues demonstrate no acute finding. Paranasal sinuses are clear. No mastoid effusion. Inner ear structures normal. Other: None. IMPRESSION: 1. No acute intracranial infarct or other abnormality identified. 2. Small remote left parieto-occipital cortical infarct, with additional tiny remote bilateral thalamic lacunar infarcts. 3. Otherwise normal brain MRI for age. Electronically Signed   By: Jeannine Boga M.D.   On: 07/02/2019 21:03   Ct Abdomen Pelvis W Contrast  Result Date: 07/02/2019 CLINICAL DATA:  Prior diverticulitis with continued nausea. EXAM: CT ABDOMEN AND PELVIS WITH CONTRAST TECHNIQUE: Multidetector  CT imaging of the abdomen and pelvis was performed using the standard protocol following bolus administration of intravenous contrast. CONTRAST:  182mL OMNIPAQUE IOHEXOL 300 MG/ML  SOLN COMPARISON:  November 08, 2015 FINDINGS: Lower chest: The lung bases are clear. The heart size is normal. Hepatobiliary: The liver is normal. Normal gallbladder.There is no biliary ductal dilation. Pancreas: Normal contours without ductal dilatation. No peripancreatic fluid collection. Spleen: No splenic laceration or hematoma. Adrenals/Urinary Tract: --Adrenal glands: No adrenal hemorrhage. --Right kidney/ureter: No hydronephrosis or perinephric hematoma. --Left kidney/ureter: No hydronephrosis or perinephric hematoma. --Urinary bladder: There is bladder wall thickening despite the degree of underdistention. Stomach/Bowel: --Stomach/Duodenum: No hiatal hernia or other gastric abnormality. Normal duodenal  course and caliber. --Small bowel: No dilatation or inflammation. --Colon: There is sigmoid diverticulosis with evidence of early sigmoid diverticulitis. --Appendix: Normal. Vascular/Lymphatic: Atherosclerotic calcification is present within the non-aneurysmal abdominal aorta, without hemodynamically significant stenosis. --No retroperitoneal lymphadenopathy. --No mesenteric lymphadenopathy. --No pelvic or inguinal lymphadenopathy. Reproductive: Again noted is an abnormal appearance of the uterus favored to represent fibroids. Other: No ascites or free air. The abdominal wall is normal. Musculoskeletal. Multilevel degenerative disc disease and facet arthrosis. No bony spinal canal stenosis. IMPRESSION: 1. Sigmoid diverticulosis with evidence for early uncomplicated sigmoid diverticulitis. 2. Mild bladder wall thickening. Correlation with urinalysis is recommended to help exclude an underlying cystitis. Electronically Signed   By: Constance Holster M.D.   On: 07/02/2019 16:57      Impression / Plan:   Erin Sutton is a 78  y.o. y/o female with diverticulitis and nausea vomiting  In the setting of acute diverticulitis, it would not be ideal, to undergo upper endoscopy for her nausea or vomiting.  Symptoms are likely due to underlying acute issues which include diverticulitis, and bladder wall thickening.  Consider urology consult for possible cystitis and further work-up with UA and urine culture  Given that repeat CT scan on this admission, compared to the July 30 CT scan still shows sigmoid diverticulitis and patient has been on an outpatient antibiotics, she should receive IV antibiotics as an inpatient for diverticulitis.  Consider surgery consult due to persistent diverticulitis  Colonoscopy contraindicated in the setting of acute diverticulitis.  Patient also has had recent colonoscopy within the last 4 years, making risk of malignancy low.  Colonoscopy should be done as an outpatient in 6 to 8 weeks out from her episode of diverticulitis to rule out any underlying lesions  EGD can be considered at the same time if nausea vomiting persist  Would recommend PPI as well to see if it helps with her upper GI symptoms  No alarm symptoms present to indicate urgent EGD at this time  Use antiemetics as needed  Thank you for involving me in the care of this patient.      LOS: 1 day   Virgel Manifold, MD  07/03/2019, 11:20 AM

## 2019-07-03 NOTE — Progress Notes (Addendum)
[place] [facility] [place] NAME: Erin Sutton    MR#:  [id number]  DATE OF BIRTH:  12-24-1940  SUBJECTIVE:  CHIEF COMPLAINT:   Chief Complaint  Patient presents with  . Emesis  Patient seen and evaluated today Has abdominal pain Has nausea and vomiting No fever and chest pain MRI brain did not reveal any acute abnormality REVIEW OF SYSTEMS:    ROS  CONSTITUTIONAL: No documented fever. Has fatigue, weakness. No weight gain, no weight loss.  EYES: No blurry or double vision.  ENT: No tinnitus. No postnasal drip. No redness of the oropharynx.  RESPIRATORY: No cough, no wheeze, no hemoptysis. No dyspnea.  CARDIOVASCULAR: No chest pain. No orthopnea. No palpitations. No syncope.  GASTROINTESTINAL: Has nausea, has vomiting , no diarrhea. Has abdominal pain. No melena or hematochezia.  GENITOURINARY: No dysuria or hematuria.  ENDOCRINE: No polyuria or nocturia. No heat or cold intolerance.  HEMATOLOGY: No anemia. No bruising. No bleeding.  INTEGUMENTARY: No rashes. No lesions.  MUSCULOSKELETAL: No arthritis. No swelling. No gout.  NEUROLOGIC: No numbness, tingling, or ataxia. No seizure-type activity.  PSYCHIATRIC: No anxiety. No insomnia. No ADD.   DRUG ALLERGIES:   Allergies  Allergen Reactions  . Ace Inhibitors Cough  . Amoxicillin Rash    Has patient had a PCN reaction causing immediate rash, facial/tongue/throat swelling, SOB or lightheadedness with hypotension: No Has patient had a PCN reaction causing severe rash involving mucus membranes or skin necrosis: No Has patient had a PCN reaction that required hospitalization: No Has patient had a PCN reaction occurring within the last 10 years: No If all of the above answers are "NO", then may proceed with Cephalosporin use.   Marland Kitchen Benicar [Olmesartan] Rash    Itching rash  . Codeine Sulfate Rash  . Levaquin [Levofloxacin In D5w] Rash    Alters mental status  . Penicillin V Potassium Rash     Has patient had a PCN reaction causing immediate rash, facial/tongue/throat swelling, SOB or lightheadedness with hypotension: No Has patient had a PCN reaction causing severe rash involving mucus membranes or skin necrosis: No Has patient had a PCN reaction that required hospitalization: No Has patient had a PCN reaction occurring within the last 10 years: No If all of the above answers are "NO", then may proceed with Cephalosporin use.   . Sulfa Antibiotics Rash    VITALS:  Blood pressure (!) 164/82, pulse 80, temperature 98 F (36.7 C), temperature source Oral, resp. rate 16, height 5' (1.524 m), weight 75.8 kg, SpO2 96 %.  PHYSICAL EXAMINATION:   Physical Exam  GENERAL:  78 y.o.-year-old patient lying in the bed with no acute distress.  EYES: Pupils equal, round, reactive to light and accommodation. No scleral icterus. Extraocular muscles intact.  HEENT: Head atraumatic, normocephalic. Oropharynx and nasopharynx clear.  NECK:  Supple, no jugular venous distention. No thyroid enlargement, no tenderness.  LUNGS: Normal breath sounds bilaterally, no wheezing, rales, rhonchi. No use of accessory muscles of respiration.  CARDIOVASCULAR: S1, S2 normal. No murmurs, rubs, or gallops.  ABDOMEN: Soft, mild tenderness around umbilicus, nondistended. Bowel sounds present. No organomegaly or mass.  EXTREMITIES: No cyanosis, clubbing or edema b/l.    NEUROLOGIC: Cranial nerves II through XII are intact. No focal Motor or sensory deficits b/l.   PSYCHIATRIC: The patient is alert and oriented x 3.  SKIN: No obvious rash, lesion, or ulcer.   LABORATORY PANEL:   CBC Recent Labs  Lab 07/03/19 0500  WBC 9.7  HGB 10.8*  HCT 33.9*  PLT 230   ------------------------------------------------------------------------------------------------------------------ Chemistries  Recent Labs  Lab 07/02/19 1435 07/03/19 0500  NA 138 138  K 3.3* 3.6  CL 102 105  CO2 24 27  GLUCOSE 114* 97  BUN  19 16  CREATININE 0.77 0.92  CALCIUM 9.8 8.9  MG 1.8  --   AST 23  --   ALT 10  --   ALKPHOS 47  --   BILITOT 0.7  --    ------------------------------------------------------------------------------------------------------------------  Cardiac Enzymes No results for input(s): TROPONINI in the last 168 hours. ------------------------------------------------------------------------------------------------------------------  RADIOLOGY:  Ct Head Wo Contrast  Result Date: 07/02/2019 CLINICAL DATA:  Two weeks of nausea with some nystagmus.  Dizziness. EXAM: CT HEAD WITHOUT CONTRAST TECHNIQUE: Contiguous axial images were obtained from the base of the skull through the vertex without intravenous contrast. COMPARISON:  CT head dated June 30, 2018. FINDINGS: Brain: No evidence of acute infarction, hemorrhage, hydrocephalus, extra-axial collection or mass lesion/mass effect. Vascular: No hyperdense vessel or unexpected calcification. Skull: Normal. Negative for fracture or focal lesion. Sinuses/Orbits: No acute finding. Other: None. IMPRESSION: No acute intracranial abnormality. Electronically Signed   By: Constance Holster M.D.   On: 07/02/2019 17:02   Mr Brain Wo Contrast  Result Date: 07/02/2019 CLINICAL DATA:  Initial evaluation for acute nausea. EXAM: MRI HEAD WITHOUT CONTRAST TECHNIQUE: Multiplanar, multiecho pulse sequences of the brain and surrounding structures were obtained without intravenous contrast. COMPARISON:  Prior head CT from earlier the same day. FINDINGS: Brain: Cerebral volume within normal limits. No significant cerebral white matter disease for age. Small focus of encephalomalacia and gliosis involving the parasagittal left parieto-occipital region compatible with chronic ischemic infarct. Additional tiny remote lacunar infarcts noted involving the bilateral thalami. No abnormal foci of restricted diffusion to suggest acute or subacute ischemia. Gray-white matter differentiation  otherwise maintained. No other areas of remote cortical infarction. No acute intracranial hemorrhage. Single chronic microhemorrhage noted at the anterior left frontal region, of doubtful significance in isolation. No mass lesion, midline shift or mass effect. No hydrocephalus. No extra-axial fluid collection. Pituitary gland suprasellar region normal. Midline structures intact. Vascular: Major intracranial vascular flow voids are maintained. Skull and upper cervical spine: Craniocervical junction within normal limits. Upper cervical spine normal. Bone marrow signal intensity within normal limits. No scalp soft tissue abnormality. Sinuses/Orbits: Patient status post bilateral ocular lens replacement. Globes and orbital soft tissues demonstrate no acute finding. Paranasal sinuses are clear. No mastoid effusion. Inner ear structures normal. Other: None. IMPRESSION: 1. No acute intracranial infarct or other abnormality identified. 2. Small remote left parieto-occipital cortical infarct, with additional tiny remote bilateral thalamic lacunar infarcts. 3. Otherwise normal brain MRI for age. Electronically Signed   By: Jeannine Boga M.D.   On: 07/02/2019 21:03   Ct Abdomen Pelvis W Contrast  Result Date: 07/02/2019 CLINICAL DATA:  Prior diverticulitis with continued nausea. EXAM: CT ABDOMEN AND PELVIS WITH CONTRAST TECHNIQUE: Multidetector CT imaging of the abdomen and pelvis was performed using the standard protocol following bolus administration of intravenous contrast. CONTRAST:  145mL OMNIPAQUE IOHEXOL 300 MG/ML  SOLN COMPARISON:  November 08, 2015 FINDINGS: Lower chest: The lung bases are clear. The heart size is normal. Hepatobiliary: The liver is normal. Normal gallbladder.There is no biliary ductal dilation. Pancreas: Normal contours without ductal dilatation. No peripancreatic fluid collection. Spleen: No splenic laceration or hematoma. Adrenals/Urinary Tract: --Adrenal glands: No adrenal hemorrhage.  --Right kidney/ureter: No hydronephrosis or perinephric hematoma. --Left kidney/ureter: No  hydronephrosis or perinephric hematoma. --Urinary bladder: There is bladder wall thickening despite the degree of underdistention. Stomach/Bowel: --Stomach/Duodenum: No hiatal hernia or other gastric abnormality. Normal duodenal course and caliber. --Small bowel: No dilatation or inflammation. --Colon: There is sigmoid diverticulosis with evidence of early sigmoid diverticulitis. --Appendix: Normal. Vascular/Lymphatic: Atherosclerotic calcification is present within the non-aneurysmal abdominal aorta, without hemodynamically significant stenosis. --No retroperitoneal lymphadenopathy. --No mesenteric lymphadenopathy. --No pelvic or inguinal lymphadenopathy. Reproductive: Again noted is an abnormal appearance of the uterus favored to represent fibroids. Other: No ascites or free air. The abdominal wall is normal. Musculoskeletal. Multilevel degenerative disc disease and facet arthrosis. No bony spinal canal stenosis. IMPRESSION: 1. Sigmoid diverticulosis with evidence for early uncomplicated sigmoid diverticulitis. 2. Mild bladder wall thickening. Correlation with urinalysis is recommended to help exclude an underlying cystitis. Electronically Signed   By: Constance Holster M.D.   On: 07/02/2019 16:57     ASSESSMENT AND PLAN:   Jordann Grime  is a 78 y.o. 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  1.  Acute sigmoid diverticulitis Start patient on IV Rocephin and Flagyl antibiotics Monitor WBC count GI consult  2.  Intractable nausea vomiting and abdominal pain-symptoms started even before starting doxycycline. -However worsened reflux symptoms now.  Could be doxy induced  esophageal ulcerations. -No other new medications.    Meloxicam on hold -  IV fluids and symptomatic treatment -GI consult for possible upper GI endoscopy. -IV proton pump inhibitor -COVID-19 test is negative  3.  Hypokalemia-replaced  4.  Hypertension-continue home medications.  Patient on Norvasc and Coreg  5.  Hypothyroidism-on Synthroid  6.  DVT prophylaxis-Lovenox  All the records are reviewed and case discussed with Care Management/Social Worker. Management plans discussed with the patient, family and they are in agreement.  CODE STATUS: Full code  DVT Prophylaxis: SCDs  TOTAL TIME TAKING CARE OF THIS PATIENT: 37 minutes.   POSSIBLE D/C IN 1 to 2 DAYS, DEPENDING ON CLINICAL CONDITION.  Saundra Shelling M.D on 07/03/2019 at 10:28 AM  Between 7am to 6pm - Pager - [phone]  After 6pm go to [email or web address] - password [email or web address] [facility]  Office  [phone]  CC: Primary care physician; Baxter Hire, MD  Note: This dictation was prepared with Dragon dictation along with smaller phrase technology. Any transcriptional errors that result from this process are unintentional.

## 2019-07-03 NOTE — Plan of Care (Signed)
  Problem: Education: Goal: Knowledge of General Education information will improve Description: Including pain rating scale, medication(s)/side effects and non-pharmacologic comfort measures Outcome: Progressing   Problem: Health Behavior/Discharge Planning: Goal: Ability to manage health-related needs will improve Outcome: Progressing   Problem: Clinical Measurements: Goal: Ability to maintain clinical measurements within normal limits will improve Outcome: Progressing Goal: Will remain free from infection Outcome: Progressing Goal: Diagnostic test results will improve Outcome: Progressing Goal: Respiratory complications will improve Outcome: Progressing Goal: Cardiovascular complication will be avoided Outcome: Not Applicable   Problem: Activity: Goal: Risk for activity intolerance will decrease Outcome: Progressing   Problem: Nutrition: Goal: Adequate nutrition will be maintained Outcome: Progressing   Problem: Coping: Goal: Level of anxiety will decrease Outcome: Progressing   Problem: Elimination: Goal: Will not experience complications related to bowel motility Outcome: Not Applicable Goal: Will not experience complications related to urinary retention Outcome: Not Applicable   Problem: Pain Managment: Goal: General experience of comfort will improve Outcome: Progressing   Problem: Safety: Goal: Ability to remain free from injury will improve Outcome: Progressing   Problem: Skin Integrity: Goal: Risk for impaired skin integrity will decrease Outcome: Progressing

## 2019-07-04 DIAGNOSIS — R110 Nausea: Secondary | ICD-10-CM

## 2019-07-04 MED ORDER — ENSURE ENLIVE PO LIQD
237.0000 mL | Freq: Two times a day (BID) | ORAL | Status: DC
  Administered 2019-07-05 – 2019-07-06 (×3): 237 mL via ORAL

## 2019-07-04 NOTE — Consult Note (Signed)
SURGICAL CONSULTATION NOTE   HISTORY OF PRESENT ILLNESS (HPI):  78 y.o. female presented to [facility] ED for evaluation of nausea and vomiting. Patient reports having nausea ambulating since the last week.  Patient initially went to [facility] for evaluation of this complaint.  CT scan was done showing uncomplicated diverticulitis.  She was discharged with oral antibiotics.  She reported that it did not improve significantly.  She went back to [facility] few days later and a CT scan was done without sign of diverticulitis.  Due to the persistent of the nausea and vomiting she came to [facility] for evaluation.  CT scan was done here showing uncomplicated sigmoid diverticulitis.  She reports associated abdominal pain.  Abdominal pain is described in the lower quadrants and the right upper quadrant.  The pain does not radiate to other part of the body.  She denies any alleviating or aggravating factor.  She denies any fever or chills.  At the moment of my evaluation patient was without any pain, no nausea and no vomiting.   I evaluated the images of the CT scan, there is mild fat stranding around the sigmoid colon consistent with the uncomplicated acute diverticulitis.   Surgery is consulted by Dr. Orbie Pyo in this context for evaluation and management of acute diverticulitis.  PAST MEDICAL HISTORY (PMH):  Past Medical History:  Diagnosis Date  . Anxiety   . Arthritis   . Chronic kidney disease   . Constipation   . GERD (gastroesophageal reflux disease)   . Hypertension   . Hypothyroidism   . Osteoporosis   . Sleep apnea   . Urinary incontinence      PAST SURGICAL HISTORY ([place]):  Past Surgical History:  Procedure Laterality Date  . BREAST BIOPSY Right    neg/stereo  . BREAST BIOPSY Left    neg/stereo  . BREAST EXCISIONAL BIOPSY Right    neg  . COLONOSCOPY WITH PROPOFOL N/A 07/02/2015   Procedure: COLONOSCOPY WITH PROPOFOL;  Surgeon: Manya Silvas, MD;  Location: [facility] ENDOSCOPY;  Service: Endoscopy;   Laterality: N/A;  . CORRECTION HAMMER TOE Right   . ESOPHAGOGASTRODUODENOSCOPY (EGD) WITH PROPOFOL N/A 07/02/2015   Procedure: ESOPHAGOGASTRODUODENOSCOPY (EGD) WITH PROPOFOL;  Surgeon: Manya Silvas, MD;  Location: [facility] ENDOSCOPY;  Service: Endoscopy;  Laterality: N/A;  . JOINT REPLACEMENT Left    knee  . REPLACEMENT TOTAL KNEE Left   . SKIN BIOPSY Right      MEDICATIONS:  Prior to Admission medications   Medication Sig Start Date End Date Taking? Authorizing Provider  amLODipine (NORVASC) 10 MG tablet Take 10 mg by mouth daily.   Yes [provider]  aspirin EC 81 MG tablet Take 81 mg by mouth daily.   Yes [provider]  aspirin EC 81 MG tablet Take 81 mg by mouth daily.   Yes [provider]  carvedilol (COREG) 12.5 MG tablet Take 12.5 mg by mouth 2 (two) times daily.   Yes [provider]  cholecalciferol (VITAMIN D3) 25 MCG (1000 UT) tablet Take 1,000 Units by mouth daily.   Yes [provider]  cyanocobalamin (,VITAMIN B-12,) 1000 MCG/ML injection Inject 1,000 mcg into the muscle every 30 (thirty) days.   Yes [provider]  isosorbide mononitrate (IMDUR) 30 MG 24 hr tablet Take 30 mg by mouth daily.   Yes [provider]  levothyroxine (SYNTHROID, LEVOTHROID) 50 MCG tablet Take 50 mcg by mouth daily before breakfast.   Yes [provider]  LORazepam (ATIVAN) 2 MG  tablet Take 2 mg by mouth every 8 (eight) hours as needed for anxiety.    Yes [provider]  magnesium oxide (MAG-OX) 400 MG tablet Take 400 mg by mouth daily.   Yes [provider]  meloxicam (MOBIC) 15 MG tablet Take 15 mg by mouth daily.   Yes [provider]  pantoprazole (PROTONIX) 40 MG tablet Take 40 mg by mouth daily.   Yes [provider]  Polyethyl Glycol-Propyl Glycol (SYSTANE OP) Place 1 drop into both eyes 2 (two) times daily.   Yes [provider]  potassium chloride SA (K-DUR,KLOR-CON) 20  MEQ tablet Take 20 mEq by mouth every morning.   Yes [provider]  pravastatin (PRAVACHOL) 40 MG tablet Take 40 mg by mouth at bedtime.    Yes [provider]  sertraline (ZOLOFT) 100 MG tablet Take 100 mg by mouth at bedtime.    Yes [provider]     ALLERGIES:  Allergies  Allergen Reactions  . Ace Inhibitors Cough  . Amoxicillin Rash    Has patient had a PCN reaction causing immediate rash, facial/tongue/throat swelling, SOB or lightheadedness with hypotension: No Has patient had a PCN reaction causing severe rash involving mucus membranes or skin necrosis: No Has patient had a PCN reaction that required hospitalization: No Has patient had a PCN reaction occurring within the last 10 years: No If all of the above answers are "NO", then may proceed with Cephalosporin use.   Marland Kitchen Benicar [Olmesartan] Rash    Itching rash  . Codeine Sulfate Rash  . Levaquin [Levofloxacin In D5w] Rash    Alters mental status  . Penicillin V Potassium Rash    Has patient had a PCN reaction causing immediate rash, facial/tongue/throat swelling, SOB or lightheadedness with hypotension: No Has patient had a PCN reaction causing severe rash involving mucus membranes or skin necrosis: No Has patient had a PCN reaction that required hospitalization: No Has patient had a PCN reaction occurring within the last 10 years: No If all of the above answers are "NO", then may proceed with Cephalosporin use.   . Sulfa Antibiotics Rash     SOCIAL HISTORY:  Social History   Socioeconomic History  . Marital status: Widowed    Spouse name: Not on file  . Number of children: Not on file  . Years of education: Not on file  . Highest education level: Not on file  Occupational History  . Not on file  Social Needs  . Financial resource strain: Not hard at all  . Food insecurity    Worry: Patient refused    Inability: Patient refused  . Transportation needs    Medical: Patient refused     Non-medical: Patient refused  Tobacco Use  . Smoking status: Never Smoker  . Smokeless tobacco: Never Used  Substance and Sexual Activity  . Alcohol use: No  . Drug use: No  . Sexual activity: Not Currently  Lifestyle  . Physical activity    Days per week: Patient refused    Minutes per session: Patient refused  . Stress: Only a little  Relationships  . Social Herbalist on phone: Patient refused    Gets together: Patient refused    Attends religious service: Patient refused    Active member of club or organization: Patient refused    Attends meetings of clubs or organizations: Patient refused    Relationship status: Patient refused  . Intimate partner violence    Fear  of current or ex partner: Patient refused    Emotionally abused: Patient refused    Physically abused: Patient refused    Forced sexual activity: Patient refused  Other Topics Concern  . Not on file  Social History Narrative   Patient lives at home by herself.  Ambulates with a cane.   Has a daughter who lives in [place]    The patient currently resides (home / rehab facility / nursing home): Home The patient normally is (ambulatory / bedbound): Ambulatory   FAMILY HISTORY:  Family History  Problem Relation Age of Onset  . Hypertension Mother   . Diabetes Mother   . Heart attack Mother   . Asthma Mother   . Peripheral vascular disease Mother   . Heart attack Father   . Hypertension Father   . Diabetes Father      REVIEW OF SYSTEMS:  Constitutional: denies weight loss, fever, chills, or sweats  Eyes: denies any other vision changes, history of eye injury  ENT: denies sore throat, hearing problems  Respiratory: denies shortness of breath, wheezing  Cardiovascular: denies chest pain, palpitations  Gastrointestinal: positive abdominal pain, N/V Genitourinary: denies burning with urination or urinary frequency Musculoskeletal: denies any other joint pains or cramps  Skin: denies  any other rashes or skin discolorations  Neurological: denies any other headache, dizziness, weakness  Psychiatric: denies any other depression, anxiety   All other review of systems were negative   VITAL SIGNS:  Temp:  [98.2 F (36.8 C)-99.3 F (37.4 C)] 98.4 F (36.9 C) (08/10 1526) Pulse Rate:  [66-84] 66 (08/10 1526) Resp:  [15-16] 16 (08/10 1526) BP: (114-142)/(55-69) 139/66 (08/10 1526) SpO2:  [95 %-97 %] 97 % (08/10 1526)     Height: 5' (152.4 cm) Weight: 75.8 kg BMI (Calculated): 32.62   INTAKE/OUTPUT:  This shift: Total I/O In: 360 [P.O.:360] Out: 700 [Urine:700]  Last 2 shifts: @IOLAST2SHIFTS @   PHYSICAL EXAM:  Constitutional:  -- Normal body habitus  -- Awake, alert, and oriented x3  Eyes:  -- Pupils equally round and reactive to light  -- No scleral icterus  Ear, nose, and throat:  -- No jugular venous distension  Pulmonary:  -- No crackles  -- Equal breath sounds bilaterally -- Breathing non-labored at rest Cardiovascular:  -- S1, S2 present  -- No pericardial rubs Gastrointestinal:  -- Abdomen soft, nontender, non-distended, no guarding or rebound tenderness -- No abdominal masses appreciated, pulsatile or otherwise  Musculoskeletal and Integumentary:  -- Wounds or skin discoloration: None appreciated -- Extremities: B/L UE and LE FROM, hands and feet warm, no edema  Neurologic:  -- Motor function: intact and symmetric -- Sensation: intact and symmetric   Labs:  CBC Latest Ref Rng & Units 07/03/2019 07/02/2019 09/02/2018  WBC 4.0 - 10.5 K/uL 9.7 9.1 6.1  Hemoglobin 12.0 - 15.0 g/dL 10.8(L) 12.4 11.7(L)  Hematocrit 36.0 - 46.0 % 33.9(L) 38.9 36.8  Platelets 150 - 400 K/uL 230 254 279   CMP Latest Ref Rng & Units 07/03/2019 07/02/2019 09/02/2018  Glucose 70 - 99 mg/dL 97 114(H) 114(H)  BUN 8 - 23 mg/dL 16 19 14   Creatinine 0.44 - 1.00 mg/dL 0.92 0.77 0.96  Sodium 135 - 145 mmol/L 138 138 139  Potassium 3.5 - 5.1 mmol/L 3.6 3.3(L) 4.0  Chloride 98 -  111 mmol/L 105 102 104  CO2 22 - 32 mmol/L 27 24 25   Calcium 8.9 - 10.3 mg/dL 8.9 9.8 10.3  Total Protein 6.5 - 8.1 g/dL -  7.4 7.8  Total Bilirubin 0.3 - 1.2 mg/dL - 0.7 0.7  Alkaline Phos 38 - 126 U/L - 47 54  AST 15 - 41 U/L - 23 20  ALT 0 - 44 U/L - 10 9   Imaging studies:  EXAM: CT ABDOMEN AND PELVIS WITH CONTRAST  TECHNIQUE: Multidetector CT imaging of the abdomen and pelvis was performed using the standard protocol following bolus administration of intravenous contrast.  CONTRAST:  19mL OMNIPAQUE IOHEXOL 300 MG/ML  SOLN  COMPARISON:  November 08, 2015  FINDINGS: Lower chest: The lung bases are clear. The heart size is normal.  Hepatobiliary: The liver is normal. Normal gallbladder.There is no biliary ductal dilation.  Pancreas: Normal contours without ductal dilatation. No peripancreatic fluid collection.  Spleen: No splenic laceration or hematoma.  Adrenals/Urinary Tract:  --Adrenal glands: No adrenal hemorrhage.  --Right kidney/ureter: No hydronephrosis or perinephric hematoma.  --Left kidney/ureter: No hydronephrosis or perinephric hematoma.  --Urinary bladder: There is bladder wall thickening despite the degree of underdistention.  Stomach/Bowel:  --Stomach/Duodenum: No hiatal hernia or other gastric abnormality. Normal duodenal course and caliber.  --Small bowel: No dilatation or inflammation.  --Colon: There is sigmoid diverticulosis with evidence of early sigmoid diverticulitis.  --Appendix: Normal.  Vascular/Lymphatic: Atherosclerotic calcification is present within the non-aneurysmal abdominal aorta, without hemodynamically significant stenosis.  --No retroperitoneal lymphadenopathy.  --No mesenteric lymphadenopathy.  --No pelvic or inguinal lymphadenopathy.  Reproductive: Again noted is an abnormal appearance of the uterus favored to represent fibroids.  Other: No ascites or free air. The abdominal wall is  normal.  Musculoskeletal. Multilevel degenerative disc disease and facet arthrosis. No bony spinal canal stenosis.  IMPRESSION: 1. Sigmoid diverticulosis with evidence for early uncomplicated sigmoid diverticulitis. 2. Mild bladder wall thickening. Correlation with urinalysis is recommended to help exclude an underlying cystitis.   Electronically Signed   By: Constance Holster M.D.   On: 07/02/2019 16:57  Assessment/Plan:  78 y.o. female with acute uncomplicated diverticultis, complicated by pertinent comorbidities including GERD, hypertension, history of stroke, chronic kidney disease, diverticulosis. Patient with acute uncomplicated diverticulitis.  She is responding well to IV antibiotic therapy.  At the moment of my evaluation patient did not have any pain.  Patient tolerating diet.  Adding considered that this is recurrent diverticulitis.  I think that this has been the same and complicated a particularly that was not responding to the oral antibiotic therapy.  Now that she has been properly treated she can be changed to oral antibiotic.  I agree with GI service that patient will benefit of colonoscopy in 6 to 8 weeks.  I will consider that the patient needs surgical management at this moment.  I also think that the patient does not need any colon surgery after this episode.  I will continue to follow her during the admission to see how this patient continue to respond to the therapy and then as outpatient she can follow-up with primary care and GI service.  I agree with current IV antibiotic therapy and current diet.  Also recommend to have low fiber/residue diet at this moment but after 2 weeks out of the inflammation, high-fiber diet will be recommended.  Patient without any previous history of diverticulitis.  Last colonoscopy 4 years ago showing diverticulosis.  Arnold Long, MD

## 2019-07-04 NOTE — Progress Notes (Signed)
[facility] NAME: Mc Hollen    MR#:  [id number]  DATE OF BIRTH:  Nov 19, 1941  SUBJECTIVE:  CHIEF COMPLAINT:   Chief Complaint  Patient presents with  . Emesis  Patient seen and evaluated today Has decreased abdominal pain Has no nausea and vomiting No fever and chest pain MRI brain did not reveal any acute abnormality REVIEW OF SYSTEMS:    ROS  CONSTITUTIONAL: No documented fever. Has fatigue, weakness. No weight gain, no weight loss.  EYES: No blurry or double vision.  ENT: No tinnitus. No postnasal drip. No redness of the oropharynx.  RESPIRATORY: No cough, no wheeze, no hemoptysis. No dyspnea.  CARDIOVASCULAR: No chest pain. No orthopnea. No palpitations. No syncope.  GASTROINTESTINAL: Has nausea, has vomiting , no diarrhea. Has abdominal pain. No melena or hematochezia.  GENITOURINARY: No dysuria or hematuria.  ENDOCRINE: No polyuria or nocturia. No heat or cold intolerance.  HEMATOLOGY: No anemia. No bruising. No bleeding.  INTEGUMENTARY: No rashes. No lesions.  MUSCULOSKELETAL: No arthritis. No swelling. No gout.  NEUROLOGIC: No numbness, tingling, or ataxia. No seizure-type activity.  PSYCHIATRIC: No anxiety. No insomnia. No ADD.   DRUG ALLERGIES:   Allergies  Allergen Reactions  . Ace Inhibitors Cough  . Amoxicillin Rash    Has patient had a PCN reaction causing immediate rash, facial/tongue/throat swelling, SOB or lightheadedness with hypotension: No Has patient had a PCN reaction causing severe rash involving mucus membranes or skin necrosis: No Has patient had a PCN reaction that required hospitalization: No Has patient had a PCN reaction occurring within the last 10 years: No If all of the above answers are "NO", then may proceed with Cephalosporin use.   Marland Kitchen Benicar [Olmesartan] Rash    Itching rash  . Codeine Sulfate Rash  . Levaquin [Levofloxacin In D5w] Rash    Alters mental status  . Penicillin V  Potassium Rash    Has patient had a PCN reaction causing immediate rash, facial/tongue/throat swelling, SOB or lightheadedness with hypotension: No Has patient had a PCN reaction causing severe rash involving mucus membranes or skin necrosis: No Has patient had a PCN reaction that required hospitalization: No Has patient had a PCN reaction occurring within the last 10 years: No If all of the above answers are "NO", then may proceed with Cephalosporin use.   . Sulfa Antibiotics Rash    VITALS:  Blood pressure (!) 118/55, pulse 84, temperature 98.2 F (36.8 C), temperature source Oral, resp. rate 15, height 5' (1.524 m), weight 75.8 kg, SpO2 95 %.  PHYSICAL EXAMINATION:   Physical Exam  GENERAL:  78 y.o.-year-old patient lying in the bed with no acute distress.  EYES: Pupils equal, round, reactive to light and accommodation. No scleral icterus. Extraocular muscles intact.  HEENT: Head atraumatic, normocephalic. Oropharynx and nasopharynx clear.  NECK:  Supple, no jugular venous distention. No thyroid enlargement, no tenderness.  LUNGS: Normal breath sounds bilaterally, no wheezing, rales, rhonchi. No use of accessory muscles of respiration.  CARDIOVASCULAR: S1, S2 normal. No murmurs, rubs, or gallops.  ABDOMEN: Soft, decreased tenderness around umbilicus, nondistended. Bowel sounds present. No organomegaly or mass.  EXTREMITIES: No cyanosis, clubbing or edema b/l.    NEUROLOGIC: Cranial nerves II through XII are intact. No focal Motor or sensory deficits b/l.   PSYCHIATRIC: The patient is alert and oriented x 3.  SKIN: No obvious rash, lesion, or ulcer.   LABORATORY PANEL:   CBC Recent Labs  Lab  07/03/19 0500  WBC 9.7  HGB 10.8*  HCT 33.9*  PLT 230   ------------------------------------------------------------------------------------------------------------------ Chemistries  Recent Labs  Lab 07/02/19 1435 07/03/19 0500  NA 138 138  K 3.3* 3.6  CL 102 105  CO2 24 27   GLUCOSE 114* 97  BUN 19 16  CREATININE 0.77 0.92  CALCIUM 9.8 8.9  MG 1.8  --   AST 23  --   ALT 10  --   ALKPHOS 47  --   BILITOT 0.7  --    ------------------------------------------------------------------------------------------------------------------  Cardiac Enzymes No results for input(s): TROPONINI in the last 168 hours. ------------------------------------------------------------------------------------------------------------------  RADIOLOGY:  Ct Head Wo Contrast  Result Date: 07/02/2019 CLINICAL DATA:  Two weeks of nausea with some nystagmus.  Dizziness. EXAM: CT HEAD WITHOUT CONTRAST TECHNIQUE: Contiguous axial images were obtained from the base of the skull through the vertex without intravenous contrast. COMPARISON:  CT head dated June 30, 2018. FINDINGS: Brain: No evidence of acute infarction, hemorrhage, hydrocephalus, extra-axial collection or mass lesion/mass effect. Vascular: No hyperdense vessel or unexpected calcification. Skull: Normal. Negative for fracture or focal lesion. Sinuses/Orbits: No acute finding. Other: None. IMPRESSION: No acute intracranial abnormality. Electronically Signed   By: Constance Holster M.D.   On: 07/02/2019 17:02   Mr Brain Wo Contrast  Result Date: 07/02/2019 CLINICAL DATA:  Initial evaluation for acute nausea. EXAM: MRI HEAD WITHOUT CONTRAST TECHNIQUE: Multiplanar, multiecho pulse sequences of the brain and surrounding structures were obtained without intravenous contrast. COMPARISON:  Prior head CT from earlier the same day. FINDINGS: Brain: Cerebral volume within normal limits. No significant cerebral white matter disease for age. Small focus of encephalomalacia and gliosis involving the parasagittal left parieto-occipital region compatible with chronic ischemic infarct. Additional tiny remote lacunar infarcts noted involving the bilateral thalami. No abnormal foci of restricted diffusion to suggest acute or subacute ischemia. Gray-white  matter differentiation otherwise maintained. No other areas of remote cortical infarction. No acute intracranial hemorrhage. Single chronic microhemorrhage noted at the anterior left frontal region, of doubtful significance in isolation. No mass lesion, midline shift or mass effect. No hydrocephalus. No extra-axial fluid collection. Pituitary gland suprasellar region normal. Midline structures intact. Vascular: Major intracranial vascular flow voids are maintained. Skull and upper cervical spine: Craniocervical junction within normal limits. Upper cervical spine normal. Bone marrow signal intensity within normal limits. No scalp soft tissue abnormality. Sinuses/Orbits: Patient status post bilateral ocular lens replacement. Globes and orbital soft tissues demonstrate no acute finding. Paranasal sinuses are clear. No mastoid effusion. Inner ear structures normal. Other: None. IMPRESSION: 1. No acute intracranial infarct or other abnormality identified. 2. Small remote left parieto-occipital cortical infarct, with additional tiny remote bilateral thalamic lacunar infarcts. 3. Otherwise normal brain MRI for age. Electronically Signed   By: Jeannine Boga M.D.   On: 07/02/2019 21:03   Ct Abdomen Pelvis W Contrast  Result Date: 07/02/2019 CLINICAL DATA:  Prior diverticulitis with continued nausea. EXAM: CT ABDOMEN AND PELVIS WITH CONTRAST TECHNIQUE: Multidetector CT imaging of the abdomen and pelvis was performed using the standard protocol following bolus administration of intravenous contrast. CONTRAST:  124mL OMNIPAQUE IOHEXOL 300 MG/ML  SOLN COMPARISON:  November 08, 2015 FINDINGS: Lower chest: The lung bases are clear. The heart size is normal. Hepatobiliary: The liver is normal. Normal gallbladder.There is no biliary ductal dilation. Pancreas: Normal contours without ductal dilatation. No peripancreatic fluid collection. Spleen: No splenic laceration or hematoma. Adrenals/Urinary Tract: --Adrenal glands: No  adrenal hemorrhage. --Right kidney/ureter: No hydronephrosis or perinephric hematoma. --  Left kidney/ureter: No hydronephrosis or perinephric hematoma. --Urinary bladder: There is bladder wall thickening despite the degree of underdistention. Stomach/Bowel: --Stomach/Duodenum: No hiatal hernia or other gastric abnormality. Normal duodenal course and caliber. --Small bowel: No dilatation or inflammation. --Colon: There is sigmoid diverticulosis with evidence of early sigmoid diverticulitis. --Appendix: Normal. Vascular/Lymphatic: Atherosclerotic calcification is present within the non-aneurysmal abdominal aorta, without hemodynamically significant stenosis. --No retroperitoneal lymphadenopathy. --No mesenteric lymphadenopathy. --No pelvic or inguinal lymphadenopathy. Reproductive: Again noted is an abnormal appearance of the uterus favored to represent fibroids. Other: No ascites or free air. The abdominal wall is normal. Musculoskeletal. Multilevel degenerative disc disease and facet arthrosis. No bony spinal canal stenosis. IMPRESSION: 1. Sigmoid diverticulosis with evidence for early uncomplicated sigmoid diverticulitis. 2. Mild bladder wall thickening. Correlation with urinalysis is recommended to help exclude an underlying cystitis. Electronically Signed   By: Constance Holster M.D.   On: 07/02/2019 16:57     ASSESSMENT AND PLAN:   Rayvn Rickerson  is a 79 y.o. 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  1.  Acute sigmoid diverticulitis Recurrent  patient on IV Rocephin and Flagyl antibiotics Monitor WBC count GI consult and f/u appreciated Surgery consult  2.  Intractable nausea vomiting and abdominal pain-symptoms started even before starting  doxycycline. Improved -No other new medications.    Meloxicam on hold -  IV fluids and symptomatic treatment -Endoscopy deferred - proton pump inhibitor -COVID-19 test is negative  3.  Hypokalemia-replaced  4.  Hypertension-continue home medications.  Patient on Norvasc and Coreg  5.  Hypothyroidism-on Synthroid  6.  DVT prophylaxis-Lovenox  7.Ambulatory dysfunction : PT evaluation  All the records are reviewed and case discussed with Care Management/Social Worker. Management plans discussed with the patient, family and they are in agreement.  CODE STATUS: Full code  DVT Prophylaxis: SCDs  TOTAL TIME TAKING CARE OF THIS PATIENT: 35 minutes.   POSSIBLE D/C IN 1 to 2 DAYS, DEPENDING ON CLINICAL CONDITION.  Saundra Shelling M.D on 07/04/2019 at 12:36 PM  Between 7am to 6pm - Pager - [phone]  After 6pm go to [email or web address] - password [email or web address] [facility]  Office  [phone]  CC: Primary care physician; Baxter Hire, MD  Note: This dictation was prepared with Dragon dictation along with smaller phrase technology. Any transcriptional errors that result from this process are unintentional.

## 2019-07-04 NOTE — Progress Notes (Signed)
Erin Lame, MD [facility]   [address], [place], [place] [address] Phone: [phone] Fax : [phone]   Subjective: The patient reports that she is feeling much better at the present time.  She denies any further nausea or abdominal pain.  The patient is now being treated for her diverticulitis.   Objective: Vital signs in last 24 hours: Vitals:   07/03/19 1944 07/04/19 0421 07/04/19 0917 07/04/19 1526  BP: 114/67 (!) 142/69 (!) 118/55 139/66  Pulse: 71 70 84 66  Resp: 16 15  16   Temp: 99.3 F (37.4 C) 98.2 F (36.8 C)  98.4 F (36.9 C)  TempSrc: Oral Oral  Oral  SpO2: 97% 95%  97%  Weight:      Height:       Weight change:   Intake/Output Summary (Last 24 hours) at 07/04/2019 1711 Last data filed at 07/04/2019 1300 Gross per 24 hour  Intake 1615.48 ml  Output 2100 ml  Net -484.52 ml     Exam: Heart:: Regular rate and rhythm, S1S2 present or without murmur or extra heart sounds Lungs: normal and clear to auscultation and percussion Abdomen: soft, nontender, normal bowel sounds   Lab Results: @LABTEST2 @ Micro Results: Recent Results (from the past 240 hour(s))  SARS Coronavirus 2 [facility] order, Performed in [facility] hospital lab) Nasopharyngeal Nasopharyngeal Swab     Status: None   Collection Time: 07/02/19  3:53 PM   Specimen: Nasopharyngeal Swab  Result Value Ref Range Status   SARS Coronavirus 2 NEGATIVE NEGATIVE Final    Comment: (NOTE) If result is NEGATIVE SARS-CoV-2 target nucleic acids are NOT DETECTED. The SARS-CoV-2 RNA is generally detectable in upper and lower  respiratory specimens during the acute phase of infection. The lowest  concentration of SARS-CoV-2 viral copies this assay can detect is 250  copies / mL. A negative result does not preclude SARS-CoV-2 infection  and should not be used as the sole basis for treatment or other  patient management decisions.  A negative result may occur with  improper specimen collection /  handling, submission of specimen other  than nasopharyngeal swab, presence of viral mutation(s) within the  areas targeted by this assay, and inadequate number of viral copies  (<250 copies / mL). A negative result must be combined with clinical  observations, patient history, and epidemiological information. If result is POSITIVE SARS-CoV-2 target nucleic acids are DETECTED. The SARS-CoV-2 RNA is generally detectable in upper and lower  respiratory specimens dur ing the acute phase of infection.  Positive  results are indicative of active infection with SARS-CoV-2.  Clinical  correlation with patient history and other diagnostic information is  necessary to determine patient infection status.  Positive results do  not rule out bacterial infection or co-infection with other viruses. If result is PRESUMPTIVE POSTIVE SARS-CoV-2 nucleic acids MAY BE PRESENT.   A presumptive positive result was obtained on the submitted specimen  and confirmed on repeat testing.  While 2019 novel coronavirus  (SARS-CoV-2) nucleic acids may be present in the submitted sample  additional confirmatory testing may be necessary for epidemiological  and / or clinical management purposes  to differentiate between  SARS-CoV-2 and other Sarbecovirus currently known to infect humans.  If clinically indicated additional testing with an alternate test  methodology [id number]) is advised. The SARS-CoV-2 RNA is generally  detectable in upper and lower respiratory sp ecimens during the acute  phase of infection. The expected result is Negative. Fact Sheet for Patients:  StrictlyIdeas.no  Fact Sheet for Healthcare Providers: BankingDealers.co.za This test is not yet approved or cleared by the [place] FDA and has been authorized for detection and/or diagnosis of SARS-CoV-2 by FDA under an Emergency Use Authorization (EUA).  This EUA will remain in effect (meaning this  test can be used) for the duration of the COVID-19 declaration under Section 564(b)(1) of the Act, 21 U.S.C. section 360bbb-3(b)(1), unless the authorization is terminated or revoked sooner. Performed at [facility], [address], [place], [place] [address]    Studies/Results: Mr Herby Abraham Contrast  Result Date: 07/02/2019 CLINICAL DATA:  Initial evaluation for acute nausea. EXAM: MRI HEAD WITHOUT CONTRAST TECHNIQUE: Multiplanar, multiecho pulse sequences of the brain and surrounding structures were obtained without intravenous contrast. COMPARISON:  Prior head CT from earlier the same day. FINDINGS: Brain: Cerebral volume within normal limits. No significant cerebral white matter disease for age. Small focus of encephalomalacia and gliosis involving the parasagittal left parieto-occipital region compatible with chronic ischemic infarct. Additional tiny remote lacunar infarcts noted involving the bilateral thalami. No abnormal foci of restricted diffusion to suggest acute or subacute ischemia. Gray-white matter differentiation otherwise maintained. No other areas of remote cortical infarction. No acute intracranial hemorrhage. Single chronic microhemorrhage noted at the anterior left frontal region, of doubtful significance in isolation. No mass lesion, midline shift or mass effect. No hydrocephalus. No extra-axial fluid collection. Pituitary gland suprasellar region normal. Midline structures intact. Vascular: Major intracranial vascular flow voids are maintained. Skull and upper cervical spine: Craniocervical junction within normal limits. Upper cervical spine normal. Bone marrow signal intensity within normal limits. No scalp soft tissue abnormality. Sinuses/Orbits: Patient status post bilateral ocular lens replacement. Globes and orbital soft tissues demonstrate no acute finding. Paranasal sinuses are clear. No mastoid effusion. Inner ear structures normal. Other: None. IMPRESSION: 1. No  acute intracranial infarct or other abnormality identified. 2. Small remote left parieto-occipital cortical infarct, with additional tiny remote bilateral thalamic lacunar infarcts. 3. Otherwise normal brain MRI for age. Electronically Signed   By: Jeannine Boga M.D.   On: 07/02/2019 21:03   Medications: I have reviewed the patient's current medications. Scheduled Meds:  amLODipine  10 mg Oral Daily   carvedilol  12.5 mg Oral BID WC   enoxaparin (LOVENOX) injection  40 mg Subcutaneous Q24H   [START ON 07/05/2019] feeding supplement (ENSURE ENLIVE)  237 mL Oral BID BM   levothyroxine  50 mcg Oral QAC breakfast   magnesium oxide  400 mg Oral Daily   pantoprazole  40 mg Oral Daily   sertraline  100 mg Oral QHS   Continuous Infusions:  cefTRIAXone (ROCEPHIN)  IV 2 g (07/04/19 1132)   metronidazole 500 mg (07/04/19 0925)   PRN Meds:.acetaminophen **OR** acetaminophen, labetalol, LORazepam, ondansetron (ZOFRAN) IV, polyvinyl alcohol, prochlorperazine, traMADol   Assessment: Active Problems:   Intractable nausea and vomiting    Plan: This patient has diverticulitis being treated with antibiotics at present time and states that she is no longer having any nausea or abdominal pain.  The patient will need a repeat colonoscopy in 6 to 8 weeks after this attack as reported in the consult note.  Nothing further to do from a GI point of view.  The patient has been told to follow-up as an outpatient.  I will sign off.  Please call if any further GI concerns or questions.  We would like to thank you for the opportunity to participate in the care of Erin Sutton.    LOS:  2 days   Erin Sutton 07/04/2019, 5:11 PM

## 2019-07-04 NOTE — Evaluation (Signed)
Physical Therapy Evaluation Patient Details Name: ATISHA HAMIDI MRN: [id number] DOB: Oct 10, 1941 Today's Date: 07/04/2019   History of Present Illness  Pt is a 78 year old female admitted for acute sigmoid diverticulitis following c/o intractable nausea and vomiting.  PMH includes CKD, osteoporosis, Htn, arthritis and anxiety.  Clinical Impression  Pt is a 78 year old female who lives in a one story home alone.  She is independent and uses a SPC intermittently at baseline.  Pt appearing very weak and lethargic.  Stated that she felt "light headed when sitting up" and "confused".  Pt oriented x3.  She was able to perform bed mobility mod I and sit at EOB independently, though with flexed posture.  She required several attempts to stand and appeared very unsteady on feet.  Pt became hypotensive when standing.  PT assisted with transfer to [facility] and with toileting which pt was able to perform with RW to steady herself and close CGA.  Pt able to take a few steps to navigate to bed and chair but unsafe to walk further due to BP.  Pt presented with generalized weakness of UE/LE's.  She will continue to benefit from skilled PT with focus on strength, safe functional mobility and tolerance to activity.    Follow Up Recommendations Home health PT;Supervision for mobility/OOB    Equipment Recommendations  Rolling walker with 5" wheels;3in1 (PT)    Recommendations for Other Services       Precautions / Restrictions        Mobility  Bed Mobility Overal bed mobility: Modified Independent             General bed mobility comments: Very slow to get to bedside but able to do so without PT assistance.  HOB elevated.  Transfers Overall transfer level: Needs assistance Equipment used: Rolling walker (2 wheeled) Transfers: Sit to/from [facility] Sit to Stand: Min assist Stand pivot transfers: Min assist       General transfer comment: Pt appears very weak and sat down abruptly  2-3 times before steadying herself.  Pt measured hypotensive, stating that she felt "lightlheaded".  Ambulation/Gait Ambulation/Gait assistance: Min guard Gait Distance (Feet): 4 Feet Assistive device: Rolling walker (2 wheeled)       General Gait Details: Able to take a few steps away from bed to posittion on BSC and then back to bed with very low foot clearance and maintaining a flexed posture.  Stairs            Wheelchair Mobility    Modified Rankin (Stroke Patients Only)       Balance Overall balance assessment: Needs assistance Sitting-balance support: Bilateral upper extremity supported;Feet supported Sitting balance-Leahy Scale: Good     Standing balance support: Bilateral upper extremity supported Standing balance-Leahy Scale: Fair Standing balance comment: Requires use of RW.                             Pertinent Vitals/Pain Pain Assessment: Faces Faces Pain Scale: Hurts a little bit Pain Location: abdominal area Pain Descriptors / Indicators: Grimacing;Guarding Pain Intervention(s): Limited activity within patient's tolerance    Home Living Family/patient expects to be discharged to:: Private residence Living Arrangements: Alone Available Help at Discharge: Friend(s);Available PRN/intermittently(Friend from church who also has a "sick" daughter to look after.) Type of Home: Mobile home Home Access: Stairs to enter Entrance Stairs-Rails: Can reach both Entrance Stairs-Number of Steps: 3 Home Layout: One level Home  Equipment: Gilford Rile - 2 wheels;Cane - single point;Toilet riser;Bedside commode      Prior Function Level of Independence: Independent with assistive device(s)         Comments: Pt uses a SPC for distance ambulation, can drive to grocery store if needed and uses SPC in store.     Hand Dominance        Extremity/Trunk Assessment   Upper Extremity Assessment Upper Extremity Assessment: Generalized weakness    Lower  Extremity Assessment Lower Extremity Assessment: Generalized weakness(Grossly 4-/5 ankle DF/PF, knee flexion/extension.  Hip flexion: 3/5.)    Cervical / Trunk Assessment Cervical / Trunk Assessment: Kyphotic  Communication   Communication: No difficulties  Cognition Arousal/Alertness: Awake/alert Behavior During Therapy: WFL for tasks assessed/performed Overall Cognitive Status: Within Functional Limits for tasks assessed                                        General Comments      Exercises Other Exercises Other Exercises: Time for toileting x15 min Other Exercises: Education regarding use of RW and time to montior vitals. x7 min   Assessment/Plan    PT Assessment Patient needs continued PT services  PT Problem List Decreased strength;Decreased mobility;Decreased activity tolerance;Decreased balance;Decreased knowledge of use of DME       PT Treatment Interventions DME instruction;Therapeutic activities;Gait training;Therapeutic exercise;Patient/family education;Stair training;Balance training;Functional mobility training    PT Goals (Current goals can be found in the Care Plan section)  Acute Rehab PT Goals Patient Stated Goal: to return home and become independent again. PT Goal Formulation: With patient Time For Goal Achievement: 07/18/19 Potential to Achieve Goals: Good    Frequency Min 2X/week   Barriers to discharge        Co-evaluation               AM-PAC PT "6 Clicks" Mobility  Outcome Measure Help needed turning from your back to your side while in a flat bed without using bedrails?: None Help needed moving from lying on your back to sitting on the side of a flat bed without using bedrails?: A Little Help needed moving to and from a bed to a chair (including a wheelchair)?: A Little Help needed standing up from a chair using your arms (e.g., wheelchair or bedside chair)?: A Little Help needed to walk in hospital room?: A Lot Help  needed climbing 3-5 steps with a railing? : A Lot 6 Click Score: 17    End of Session Equipment Utilized During Treatment: Gait belt Activity Tolerance: Patient limited by fatigue(Hypotensive.) Patient left: in chair;with chair alarm set;with call bell/phone within reach   PT Visit Diagnosis: Unsteadiness on feet (R26.81);Other abnormalities of gait and mobility (R26.89);Muscle weakness (generalized) (M62.81)    Time: 7026-3785 PT Time Calculation (min) (ACUTE ONLY): 46 min   Charges:   PT Evaluation $PT Eval Low Complexity: 1 Low PT Treatments $Therapeutic Activity: 8-22 mins        Roxanne Gates, PT, DPT   Roxanne Gates 07/04/2019, 1:00 PM

## 2019-07-04 NOTE — Progress Notes (Signed)
Initial Nutrition Assessment  DOCUMENTATION CODES:   Obesity unspecified  INTERVENTION:  Provide Ensure Enlive po BID, each supplement provides 350 kcal and 20 grams of protein.  Reviewed low-fiber diet with patient for while healing from diverticulitis.  NUTRITION DIAGNOSIS:   Inadequate oral intake related to decreased appetite as evidenced by per patient/family report.  GOAL:   Patient will meet greater than or equal to 90% of their needs  MONITOR:   PO intake, Supplement acceptance, Labs, Weight trends, I & O's  REASON FOR ASSESSMENT:   Malnutrition Screening Tool    ASSESSMENT:   78 year old female with PMHx of HTN, anxiety, sleep apnea, CKD, arthritis, hypothyroidism, GERD, osteoporosis admitted with acute sigmoid diverticulitis.   Met with patient at bedside. She reports she is feeling better today and her appetite is improving. Her abdominal pain, N/V have all improved. She reports that she had a decreased appetite for 2 weeks PTA and was not able to keep food down. Today she was able to eat "most" of her breakfast (60% per chart) and had 100% of her lunch. She is amenable to also trying Ensure tomorrow to help in meeting calorie/protein needs. Patient also asking about nutrition for diverticulitis. Discussed low-fiber diet while healing and then slowly increasing fiber once healed to a high-fiber diet.  Patient reports her UBW was 187 lbs and she has lost about 18 lbs recently. There is no recent weight history in chart to trend. She was 75.3 kg on 09/02/2018. She is currently 75.8 kg (167 lbs).   Medications reviewed and include: carvedilol, levothyroxine, magnesium oxide 400 mg daily, pantoprazole, sertraline, ceftriaxone, Flagyl.  Labs reviewed.  Patient does not meet criteria for malnutrition at this time.  NUTRITION - FOCUSED PHYSICAL EXAM:    Most Recent Value  Orbital Region  No depletion  Upper Arm Region  No depletion  Thoracic and Lumbar Region  No  depletion  Buccal Region  No depletion  Temple Region  No depletion  Clavicle Bone Region  No depletion  Clavicle and Acromion Bone Region  No depletion  Scapular Bone Region  No depletion  Dorsal Hand  No depletion  Patellar Region  No depletion  Anterior Thigh Region  No depletion  Posterior Calf Region  No depletion  Edema (RD Assessment)  None  Hair  Reviewed  Eyes  Reviewed  Mouth  Reviewed  Skin  Reviewed  Nails  Reviewed     Diet Order:   Diet Order            Diet regular Room service appropriate? Yes; Fluid consistency: Thin  Diet effective now             EDUCATION NEEDS:   Education needs have been addressed  Skin:  Skin Assessment: Reviewed RN Assessment  Last BM:  07/03/2019 per chart  Height:   Ht Readings from Last 1 Encounters:  07/02/19 5' (1.524 m)   Weight:   Wt Readings from Last 1 Encounters:  07/02/19 75.8 kg   Ideal Body Weight:  45.5 kg  BMI:  Body mass index is 32.61 kg/m.  Estimated Nutritional Needs:   Kcal:  1500-1700  Protein:  75-85 grams  Fluid:  1.5-1.7 L/day  Willey Blade, MS, RD, LDN Office: [phone] Pager: [phone] After Hours/Weekend Pager: [phone]

## 2019-07-05 LAB — CBC
HCT: 35.5 % — ABNORMAL LOW (ref 36.0–46.0)
Hemoglobin: 11.1 g/dL — ABNORMAL LOW (ref 12.0–15.0)
MCH: 27.5 pg (ref 26.0–34.0)
MCHC: 31.3 g/dL (ref 30.0–36.0)
MCV: 88.1 fL (ref 80.0–100.0)
Platelets: 217 K/uL (ref 150–400)
RBC: 4.03 MIL/uL (ref 3.87–5.11)
RDW: 17.3 % — ABNORMAL HIGH (ref 11.5–15.5)
WBC: 8.1 K/uL (ref 4.0–10.5)
nRBC: 0 % (ref 0.0–0.2)

## 2019-07-05 LAB — BASIC METABOLIC PANEL
Anion gap: 9 (ref 5–15)
BUN: 12 mg/dL (ref 8–23)
CO2: 23 mmol/L (ref 22–32)
Calcium: 9.1 mg/dL (ref 8.9–10.3)
Chloride: 105 mmol/L (ref 98–111)
Creatinine, Ser: 0.88 mg/dL (ref 0.44–1.00)
GFR calc Af Amer: >60 mL/min (ref >60)
GFR calc non Af Amer: >60 mL/min (ref >60)
Glucose, Bld: 172 mg/dL — ABNORMAL HIGH (ref 70–99)
Potassium: 3.4 mmol/L — ABNORMAL LOW (ref 3.5–5.1)
Sodium: 137 mmol/L (ref 135–145)

## 2019-07-05 MED ORDER — CEPHALEXIN 500 MG PO CAPS: 500.0000 mg | ORAL_CAPSULE | Freq: Three times a day (TID) | ORAL | 0 refills | Status: DC

## 2019-07-05 MED ORDER — DOCUSATE SODIUM 100 MG PO CAPS: 100.0000 mg | ORAL_CAPSULE | Freq: Two times a day (BID) | ORAL | 2 refills | Status: AC

## 2019-07-05 MED ORDER — METRONIDAZOLE 500 MG PO TABS: 500.0000 mg | ORAL_TABLET | Freq: Three times a day (TID) | ORAL | 0 refills | Status: AC

## 2019-07-05 MED ORDER — ENSURE ENLIVE PO LIQD: 237.0000 mL | Freq: Two times a day (BID) | ORAL | 12 refills | Status: DC

## 2019-07-05 NOTE — TOC Progression Note (Signed)
Transition of Care [facility]) - Progression Note    Patient Details  Name: LILIAN FUHS MRN: [id number] Date of Birth: May 03, 1941  Transition of Care [facility]) CM/SW Contact  Shelbie Hutching, RN Phone Number: 07/05/2019, 12:25 PM  Clinical Narrative:     PT worked with patient and reports that patient is not safe to return home at this time by herself.  PT is recommending SNF.  Bed search started.    Expected Discharge Plan: [place] Barriers to Discharge: Continued Medical Work up  Expected Discharge Plan and Services Expected Discharge Plan: [place]   Discharge Planning Services: CM Consult Post Acute Care Choice: [place] arrangements for the past 2 months: Mobile Home Expected Discharge Date: 07/05/19                         HH Arranged: RN, PT, OT, Nurse's Aide, Social Work [employer] Agency: [place] ([place]) Date [place]: 07/05/19 Time [place]: [phone] Representative spoke with at [place]: [place] (SDOH) Interventions    Readmission Risk Interventions No flowsheet data found.

## 2019-07-05 NOTE — Progress Notes (Signed)
[place] at [place] NAME: Erin Sutton    MR#:  [id number]  DATE OF BIRTH:  12-Jun-1941  SUBJECTIVE:  CHIEF COMPLAINT:   Chief Complaint  Patient presents with  . Emesis  Patient seen and evaluated today Has decreased abdominal pain Has no nausea and vomiting No fever and chest pain MRI brain did not reveal any acute abnormality Has generalized weakness Patient tried to get up to go to the bathroom, felt dizzy and weak REVIEW OF SYSTEMS:    ROS  CONSTITUTIONAL: No documented fever. Has fatigue, weakness. No weight gain, no weight loss.  EYES: No blurry or double vision.  ENT: No tinnitus. No postnasal drip. No redness of the oropharynx.  RESPIRATORY: No cough, no wheeze, no hemoptysis. No dyspnea.  CARDIOVASCULAR: No chest pain. No orthopnea. No palpitations. No syncope.  GASTROINTESTINAL: Has nausea, has vomiting , no diarrhea. Has abdominal pain. No melena or hematochezia.  GENITOURINARY: No dysuria or hematuria.  ENDOCRINE: No polyuria or nocturia. No heat or cold intolerance.  HEMATOLOGY: No anemia. No bruising. No bleeding.  INTEGUMENTARY: No rashes. No lesions.  MUSCULOSKELETAL: No arthritis. No swelling. No gout.  NEUROLOGIC: No numbness, tingling, or ataxia. No seizure-type activity.  PSYCHIATRIC: No anxiety. No insomnia. No ADD.   DRUG ALLERGIES:   Allergies  Allergen Reactions  . Ace Inhibitors Cough  . Amoxicillin Rash    Has patient had a PCN reaction causing immediate rash, facial/tongue/throat swelling, SOB or lightheadedness with hypotension: No Has patient had a PCN reaction causing severe rash involving mucus membranes or skin necrosis: No Has patient had a PCN reaction that required hospitalization: No Has patient had a PCN reaction occurring within the last 10 years: No If all of the above answers are "NO", then may proceed with Cephalosporin use.   Marland Kitchen Benicar [Olmesartan] Rash    Itching rash  . Codeine  Sulfate Rash  . Levaquin [Levofloxacin In D5w] Rash    Alters mental status  . Penicillin V Potassium Rash    Has patient had a PCN reaction causing immediate rash, facial/tongue/throat swelling, SOB or lightheadedness with hypotension: No Has patient had a PCN reaction causing severe rash involving mucus membranes or skin necrosis: No Has patient had a PCN reaction that required hospitalization: No Has patient had a PCN reaction occurring within the last 10 years: No If all of the above answers are "NO", then may proceed with Cephalosporin use.   . Sulfa Antibiotics Rash    VITALS:  Blood pressure 112/70, pulse 97, temperature 98.9 F (37.2 C), resp. rate 18, height 5' (1.524 m), weight 75.8 kg, SpO2 94 %.  PHYSICAL EXAMINATION:   Physical Exam  GENERAL:  78 y.o.-year-old patient lying in the bed with no acute distress.  EYES: Pupils equal, round, reactive to light and accommodation. No scleral icterus. Extraocular muscles intact.  HEENT: Head atraumatic, normocephalic. Oropharynx and nasopharynx clear.  NECK:  Supple, no jugular venous distention. No thyroid enlargement, no tenderness.  LUNGS: Normal breath sounds bilaterally, no wheezing, rales, rhonchi. No use of accessory muscles of respiration.  CARDIOVASCULAR: S1, S2 normal. No murmurs, rubs, or gallops.  ABDOMEN: Soft, decreased tenderness around umbilicus, nondistended. Bowel sounds present. No organomegaly or mass.  EXTREMITIES: No cyanosis, clubbing or edema b/l.    NEUROLOGIC: Cranial nerves II through XII are intact. No focal Motor or sensory deficits b/l.   PSYCHIATRIC: The patient is alert and oriented x 3.  SKIN: No obvious rash, lesion,  or ulcer.   LABORATORY PANEL:   CBC Recent Labs  Lab 07/03/19 0500  WBC 9.7  HGB 10.8*  HCT 33.9*  PLT 230   ------------------------------------------------------------------------------------------------------------------ Chemistries  Recent Labs  Lab 07/02/19 1435  07/03/19 0500  NA 138 138  K 3.3* 3.6  CL 102 105  CO2 24 27  GLUCOSE 114* 97  BUN 19 16  CREATININE 0.77 0.92  CALCIUM 9.8 8.9  MG 1.8  --   AST 23  --   ALT 10  --   ALKPHOS 47  --   BILITOT 0.7  --    ------------------------------------------------------------------------------------------------------------------  Cardiac Enzymes No results for input(s): TROPONINI in the last 168 hours. ------------------------------------------------------------------------------------------------------------------  RADIOLOGY:  No results found.   ASSESSMENT AND PLAN:   Erin Sutton  is a 78 y.o. 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  1.  Acute sigmoid diverticulitis Recurrent Improving Status post surgery evaluation No surgery recommended  patient on IV Rocephin and Flagyl antibiotics Monitor WBC count GI consult and f/u appreciated  2.  Intractable nausea vomiting and abdominal pain-symptoms started even before starting doxycycline. Improved -No other new medications.    Meloxicam on hold -  IV fluids and symptomatic treatment -Endoscopy deferred - proton pump inhibitor -COVID-19 test is negative  3.  Hypokalemia-replaced  4.  Hypertension-continue home medications.  Patient on Norvasc and Coreg  5.  Hypothyroidism-on Synthroid  6.  DVT prophylaxis-Lovenox  7.Ambulatory dysfunction : PT evaluation appreciated SNF placement recommended  8.  Social worker follow-up for SNF placement  All the records are reviewed and case discussed with Care Management/Social Worker. Management plans discussed with the patient, family and they are in agreement.  CODE STATUS: Full code  DVT Prophylaxis: SCDs  TOTAL TIME TAKING CARE OF  THIS PATIENT: 35 minutes.   POSSIBLE D/C IN 1 to 2 DAYS, DEPENDING ON CLINICAL CONDITION.  Saundra Shelling M.D on 07/05/2019 at 12:21 PM  Between 7am to 6pm - Pager - [phone]  After 6pm go to [email or web address] - password [email or web address] [place] [facility]  Office  [phone]  CC: Primary care physician; Baxter Hire, MD  Note: This dictation was prepared with Dragon dictation along with smaller phrase technology. Any transcriptional errors that result from this process are unintentional.

## 2019-07-05 NOTE — TOC Initial Note (Signed)
Transition of Care [facility]) - Initial/Assessment Note    Patient Details  Name: Erin Sutton MRN: [id number] Date of Birth: 1941-08-05  Transition of Care [facility]) CM/SW Contact:    Shelbie Hutching, RN Phone Number: 07/05/2019, 9:35 AM  Clinical Narrative:                 Patient admitted with intractable nausea and vomiting.  Patient is from home and lives alone in [place].  Patient has a daughter that lives in [place].  Patient's daughter is married, works and has 2 children.  Patient is beginning to require more assistance and may no longer be safe to live alone.  Original plan for discharge was for patient to return home with home health but when nurse was getting patient up to bedside commode patient could not stand without assistance and was very weak.  Patient would not be safe at home alone, will get PT to reevaluate.  Patient's daughter Lynelle Smoke reports that they have discussed assisted living in the past or hiring someone part time to come in and help.   RNCM will await PT recommendations.   Expected Discharge Plan: [place] Barriers to Discharge: Continued Medical Work up   Patient Goals and CMS Choice   CMS Medicare.gov Compare Post Acute Care list provided to:: Patient Choice offered to / list presented to : Patient  Expected Discharge Plan and Services Expected Discharge Plan: [place]   Discharge Planning Services: CM Consult Post Acute Care Choice: [place] arrangements for the past 2 months: Mobile Home Expected Discharge Date: 07/05/19                         HH Arranged: RN, PT, OT, Nurse's Aide, Social Work [employer] Agency: [place] ([place]) Date [place]: 07/05/19 Time [place]: [phone] Representative spoke with at [place]: Floydene Flock  Prior Living Arrangements/Services Living arrangements for the past 2 months: Mobile Home Lives with:: Self Patient language and need for  interpreter reviewed:: No Do you feel safe going back to the place where you live?: Yes      Need for Family Participation in Patient Care: Yes (Comment)(lives alone) Care giver support system in place?: Yes (comment)(friends and daughter)   Criminal Activity/Legal Involvement Pertinent to Current Situation/Hospitalization: No - Comment as needed  Activities of Daily Living Home Assistive Devices/Equipment: Eyeglasses, Cane (specify quad or straight), Walker (specify type) ADL Screening (condition at time of admission) Patient's cognitive ability adequate to safely complete daily activities?: Yes Is the patient deaf or have difficulty hearing?: No Does the patient have difficulty seeing, even when wearing glasses/contacts?: No Does the patient have difficulty concentrating, remembering, or making decisions?: No Patient able to express need for assistance with ADLs?: Yes Does the patient have difficulty dressing or bathing?: No Independently performs ADLs?: Yes (appropriate for developmental age) Does the patient have difficulty walking or climbing stairs?: Yes Weakness of Legs: None Weakness of Arms/Hands: None  Permission Sought/Granted Permission sought to share information with : Case Manager, Other (comment), Family Supports       Permission granted to share info w AGENCY: [facility]  Permission granted to share info w Relationship: Daughter Tammy     Emotional Assessment Appearance:: Appears stated age Attitude/Demeanor/Rapport: Engaged Affect (typically observed): Accepting Orientation: : Oriented to Self, Oriented to Place, Oriented to  Time, Oriented to Situation Alcohol / Substance Use: Not Applicable  Psych Involvement: No (comment)  Admission diagnosis:  Hypokalemia [E87.6] Nausea [R11.0] Patient Active Problem List   Diagnosis Date Noted  . Nausea   . Intractable nausea and vomiting 07/02/2019  . Confusion 06/30/2018  . Hypothyroidism 06/30/2018  . HTN  (hypertension) 06/30/2018  . GERD (gastroesophageal reflux disease) 06/30/2018  . Anxiety 06/30/2018  . Accelerated hypertension 06/30/2018  . Sepsis ([place]) 12/17/2016   PCP:  Baxter Hire, MD Pharmacy:   [place], [place] [address] Phone: [phone] Fax: [phone]  [facility] [place], [place] [address] [address] Phone: [phone] Fax: [place], [place] [address] Phone: [phone] Fax: [phone]     Social Determinants of Health (SDOH) Interventions    Readmission Risk Interventions No flowsheet data found.

## 2019-07-05 NOTE — Progress Notes (Signed)
Physical Therapy Treatment Patient Details Name: Erin Sutton MRN: [id number] DOB: 12-26-1940 Today's Date: 07/05/2019    History of Present Illness Pt is a 78 year old female admitted for acute sigmoid diverticulitis following c/o intractable nausea and vomiting.  PMH includes CKD, osteoporosis, Htn, arthritis and anxiety.    PT Comments    Patient alert, agreeable to PT, main complaint of fatigue/weakness but no complaints of pain. RN notified PT of pt status this AM, significant difficulty with transferring to [facility] due to unsteadiness/fatigue. Orthostatic vitals assessed see flowsheet for details but WFLs, pt with mild complaints of light headedness with mobility. Pt required bilateral UE support (RW) with mobility as well as minA due to LE weakness/buckling. Pt unsafe to mobilize without physical assist at this time, only able to ambulate ~93ft. Discharge planning needs updating to STR, RN and case management notified, pt agreeable to STR. Pt up in chair at end of session, all needs in reach.   Follow Up Recommendations  SNF     Equipment Recommendations  Rolling walker with 5" wheels    Recommendations for Other Services       Precautions / Restrictions Precautions Precautions: Fall Restrictions Weight Bearing Restrictions: No    Mobility  Bed Mobility Overal bed mobility: Modified Independent             General bed mobility comments: Very slow to get to bedside but able to do so without PT assistance.  HOB elevated.  Transfers Overall transfer level: Needs assistance Equipment used: Rolling walker (2 wheeled) Transfers: Sit to/from Stand Sit to Stand: Min assist         General transfer comment: with complaints of lightheadedness, orthostatic vitals assessed, WFLs  Ambulation/Gait Ambulation/Gait assistance: Min assist;Min guard Gait Distance (Feet): 2 Feet Assistive device: Rolling walker (2 wheeled)       General Gait Details: Able to take a few  steps away from bed to chair, often with knees buckling/sway noted. minA for safety throughout   Stairs             Wheelchair Mobility    Modified Rankin (Stroke Patients Only)       Balance Overall balance assessment: Needs assistance Sitting-balance support: Bilateral upper extremity supported;Feet supported Sitting balance-Leahy Scale: Good     Standing balance support: Bilateral upper extremity supported Standing balance-Leahy Scale: Fair Standing balance comment: Requires use of RW.                            Cognition Arousal/Alertness: Awake/alert Behavior During Therapy: WFL for tasks assessed/performed Overall Cognitive Status: Within Functional Limits for tasks assessed                                        Exercises Other Exercises Other Exercises: Pt barely able to stand long enough for BP measurement, needed seated rest break/minA to return to sitting prior to ambulation to chair    General Comments        Pertinent Vitals/Pain Pain Assessment: No/denies pain    Home Living                      Prior Function            PT Goals (current goals can now be found in the care plan section) Progress towards PT goals: Progressing  toward goals(slowly)    Frequency    Min 2X/week      PT Plan Discharge plan needs to be updated    Co-evaluation              AM-PAC PT "6 Clicks" Mobility   Outcome Measure  Help needed turning from your back to your side while in a flat bed without using bedrails?: A Little Help needed moving from lying on your back to sitting on the side of a flat bed without using bedrails?: A Little Help needed moving to and from a bed to a chair (including a wheelchair)?: A Little Help needed standing up from a chair using your arms (e.g., wheelchair or bedside chair)?: A Little Help needed to walk in hospital room?: A Lot Help needed climbing 3-5 steps with a railing? :  Total 6 Click Score: 15    End of Session Equipment Utilized During Treatment: Gait belt Activity Tolerance: Patient limited by fatigue Patient left: in chair;with chair alarm set;with call bell/phone within reach Nurse Communication: Mobility status PT Visit Diagnosis: Unsteadiness on feet (R26.81);Other abnormalities of gait and mobility (R26.89);Muscle weakness (generalized) (M62.81)     Time: 0867-6195 PT Time Calculation (min) (ACUTE ONLY): 23 min  Charges:  $Therapeutic Exercise: 23-37 mins                     Lieutenant Diego PT, DPT 10:30 AM,07/05/19 [phone]

## 2019-07-05 NOTE — TOC Progression Note (Signed)
Transition of Care [facility]) - Progression Note    Patient Details  Name: Erin Sutton MRN: [id number] Date of Birth: 1941-07-06  Transition [facility]) CM/SW Contact  Shelbie Hutching, RN Phone Number: 07/05/2019, 4:18 PM  Clinical Narrative:    Patient chooses a bed at [facility].  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  RNCM started authorization.    Expected Discharge Plan: [place] Barriers to Discharge: Continued Medical Work up  Expected Discharge Plan and Services Expected Discharge Plan: [place]   Discharge Planning Services: CM Consult Post Acute Care Choice: [place] arrangements for the past 2 months: Mobile Home Expected Discharge Date: 07/05/19                         HH Arranged: RN, PT, OT, Nurse's Aide, Social Work [employer] Agency: [place] ([place]) Date [place]: 07/05/19 Time [place]: [phone] Representative spoke with at [place]: [place] (SDOH) Interventions    Readmission Risk Interventions No flowsheet data found.

## 2019-07-05 NOTE — NC FL2 (Signed)
[place] LEVEL OF CARE SCREENING TOOL     IDENTIFICATION  Patient Name: Erin Sutton Birthdate: April 27, 1941 Sex: female Admission Date (Current Location): 07/02/2019  [place] and [place] Number:  Engineering geologist and Address:  [facility], [address], [place], [place] [address]      Provider Number: [id number]  Attending Physician Name and Address:  Saundra Shelling, MD  Relative Name and Phone Number:  [name] [phone]    Current Level of Care: Hospital Recommended Level of Care: [place] Prior Approval Number:    Date Approved/Denied:   PASRR Number: pending  Discharge Plan: SNF    Current Diagnoses: Patient Active Problem List   Diagnosis Date Noted  . Nausea   . Intractable nausea and vomiting 07/02/2019  . Confusion 06/30/2018  . Hypothyroidism 06/30/2018  . HTN (hypertension) 06/30/2018  . GERD (gastroesophageal reflux disease) 06/30/2018  . Anxiety 06/30/2018  . Accelerated hypertension 06/30/2018  . Sepsis ([employer]) 12/17/2016    Orientation RESPIRATION BLADDER Height & Weight     Self, Time, Situation, Place  Normal Continent Weight: 75.8 kg Height:  5' (152.4 cm)  BEHAVIORAL SYMPTOMS/MOOD NEUROLOGICAL BOWEL NUTRITION STATUS      Continent Diet  AMBULATORY STATUS COMMUNICATION OF NEEDS Skin   Extensive Assist Verbally Normal                       Personal Care Assistance Level of Assistance  Bathing, Feeding, Dressing Bathing Assistance: Limited assistance Feeding assistance: Independent Dressing Assistance: Limited assistance     Functional Limitations Info             SPECIAL CARE FACTORS FREQUENCY  PT (By licensed PT), OT (By licensed OT)     PT Frequency: 5 times per week OT Frequency: 5 times per week            Contractures Contractures Info: Not present    Additional Factors Info  Code Status, Allergies Code Status Info: full Allergies Info: ace  inhibitors, amoxicillin, benicar, codeine, levaquin, penicillin V Potassium, sulfa           Current Medications (07/05/2019):  This is the current hospital active medication list Current Facility-Administered Medications  Medication Dose Route Frequency Provider Last Rate Last Dose  . acetaminophen (TYLENOL) tablet 650 mg  650 mg Oral Q6H PRN Gladstone Lighter, MD   650 mg at 07/02/19 2241   Or  . acetaminophen (TYLENOL) suppository 650 mg  650 mg Rectal Q6H PRN Gladstone Lighter, MD      . amLODipine (NORVASC) tablet 10 mg  10 mg Oral Daily Gladstone Lighter, MD   10 mg at 07/05/19 0901  . carvedilol (COREG) tablet 12.5 mg  12.5 mg Oral BID WC Gladstone Lighter, MD   12.5 mg at 07/05/19 0901  . cefTRIAXone (ROCEPHIN) 2 g in sodium chloride 0.9 % 100 mL IVPB  2 g Intravenous Q24H Pyreddy, Pavan, MD 200 mL/hr at 07/05/19 1050 2 g at 07/05/19 1050  . enoxaparin (LOVENOX) injection 40 mg  40 mg Subcutaneous Q24H Gladstone Lighter, MD   40 mg at 07/04/19 2102  . feeding supplement (ENSURE ENLIVE) (ENSURE ENLIVE) liquid 237 mL  237 mL Oral BID BM Pyreddy, Pavan, MD      . labetalol (NORMODYNE) injection 10 mg  10 mg Intravenous Q2H PRN Lance Coon, MD      . levothyroxine (SYNTHROID) tablet 50 mcg  50 mcg Oral QAC breakfast Gladstone Lighter,  MD   50 mcg at 07/05/19 0421  . LORazepam (ATIVAN) injection 1 mg  1 mg Intravenous Q6H PRN Gladstone Lighter, MD   1 mg at 07/04/19 2110  . magnesium oxide (MAG-OX) tablet 400 mg  400 mg Oral Daily Gladstone Lighter, MD   400 mg at 07/05/19 0901  . metroNIDAZOLE (FLAGYL) IVPB 500 mg  500 mg Intravenous Q8H Pyreddy, Reatha Harps, MD 100 mL/hr at 07/05/19 0917 500 mg at 07/05/19 0917  . ondansetron (ZOFRAN) injection 4 mg  4 mg Intravenous Q6H PRN Pyreddy, Pavan, MD      . pantoprazole (PROTONIX) EC tablet 40 mg  40 mg Oral Daily Vonda Antigua B, MD   40 mg at 07/05/19 0901  . polyvinyl alcohol (LIQUIFILM TEARS) 1.4 % ophthalmic solution 1 drop  1 drop  Both Eyes PRN Pyreddy, Pavan, MD      . prochlorperazine (COMPAZINE) injection 10 mg  10 mg Intravenous Q6H PRN Gladstone Lighter, MD      . sertraline (ZOLOFT) tablet 100 mg  100 mg Oral QHS Gladstone Lighter, MD   100 mg at 07/04/19 2102  . traMADol (ULTRAM) tablet 50 mg  50 mg Oral Q6H PRN Gladstone Lighter, MD         Discharge Medications: Please see discharge summary for a list of discharge medications.  Relevant Imaging Results:  Relevant Lab Results:   Additional Information SS# [id number]  Shelbie Hutching, RN

## 2019-07-05 NOTE — Progress Notes (Signed)
To Whom It May Concern:  Please be advised that [facility] will require a short-term nursing home stay - anticipated 30 days or less for rehabilitation and strengthening. The plan is for return home.

## 2019-07-05 NOTE — Care Management Important Message (Signed)
Important Message  Patient Details  Name: Erin Sutton MRN: [id number] Date of Birth: 04/11/1941   Medicare Important Message Given:  Other (see comment)  Verbal Consent obtained by pt. Intake 07/04/19 @ 11:04 am   Darius Bump [name] 07/05/2019, 7:40 AM

## 2019-07-06 LAB — SARS CORONAVIRUS 2 BY RT PCR (HOSPITAL ORDER, PERFORMED IN ~~LOC~~ HOSPITAL LAB): SARS Coronavirus 2: NEGATIVE

## 2019-07-06 MED ORDER — SIMETHICONE 80 MG PO CHEW: 80.0000 mg | CHEWABLE_TABLET | Freq: Four times a day (QID) | ORAL | 0 refills | Status: DC | PRN

## 2019-07-06 MED ORDER — POTASSIUM CHLORIDE CRYS ER 20 MEQ PO TBCR
40.0000 meq | EXTENDED_RELEASE_TABLET | Freq: Once | ORAL | Status: AC
  Administered 2019-07-06: 12:00:00 40 meq via ORAL
  Filled 2019-07-06: qty 2

## 2019-07-06 MED ORDER — SIMETHICONE 80 MG PO CHEW
80.0000 mg | CHEWABLE_TABLET | Freq: Four times a day (QID) | ORAL | Status: DC | PRN
  Filled 2019-07-06: qty 1

## 2019-07-06 MED ORDER — METRONIDAZOLE 500 MG PO TABS: 500.0000 mg | ORAL_TABLET | Freq: Three times a day (TID) | ORAL | 0 refills | Status: AC

## 2019-07-06 MED ORDER — METRONIDAZOLE 500 MG PO TABS
500.0000 mg | ORAL_TABLET | Freq: Three times a day (TID) | ORAL | Status: DC
  Administered 2019-07-06 (×2): 500 mg via ORAL
  Filled 2019-07-06 (×2): qty 1

## 2019-07-06 NOTE — TOC Progression Note (Signed)
Transition of Care [facility]) - Progression Note    Patient Details  Name: Erin Sutton MRN: [id number] Date of Birth: 03-27-1941  Transition of Care [facility] CM/SW Contact  Shelbie Hutching, RN Phone Number: 07/06/2019, 8:25 AM  Clinical Narrative:    [place] has offered a bed and patient has accepted.  [facility] authorization has been started.  Pasrr # [id number] effective 07/05/19 expires 08/04/19.    Expected Discharge Plan: [place] Barriers to Discharge: Continued Medical Work up  Expected Discharge Plan and Services Expected Discharge Plan: [place]   Discharge Planning Services: CM Consult Post Acute Care Choice: [place] arrangements for the past 2 months: Mobile Home Expected Discharge Date: 07/05/19                         HH Arranged: RN, PT, OT, Nurse's Aide, Social Work [employer] Agency: [place] ([place]) Date [place]: 07/05/19 Time [place]: [phone] Representative spoke with at [place]: [place] (SDOH) Interventions    Readmission Risk Interventions No flowsheet data found.

## 2019-07-06 NOTE — Progress Notes (Signed)
Erin Sutton at [place] NAME: Erin Sutton    MR#:  [id number]  DATE OF BIRTH:  Nov 05, 1941  SUBJECTIVE:  CHIEF COMPLAINT:   Chief Complaint  Patient presents with  . Emesis  Patient seen and evaluated today Has decreased abdominal pain Has flatulence Has no nausea and vomiting No fever and chest pain MRI brain did not reveal any acute abnormality Has generalized weakness REVIEW OF SYSTEMS:    ROS  CONSTITUTIONAL: No documented fever. Has fatigue, weakness. No weight gain, no weight loss.  EYES: No blurry or double vision.  ENT: No tinnitus. No postnasal drip. No redness of the oropharynx.  RESPIRATORY: No cough, no wheeze, no hemoptysis. No dyspnea.  CARDIOVASCULAR: No chest pain. No orthopnea. No palpitations. No syncope.  GASTROINTESTINAL: Has nausea, has vomiting , no diarrhea. Has abdominal pain. No melena or hematochezia.  GENITOURINARY: No dysuria or hematuria.  ENDOCRINE: No polyuria or nocturia. No heat or cold intolerance.  HEMATOLOGY: No anemia. No bruising. No bleeding.  INTEGUMENTARY: No rashes. No lesions.  MUSCULOSKELETAL: No arthritis. No swelling. No gout.  NEUROLOGIC: No numbness, tingling, or ataxia. No seizure-type activity.  PSYCHIATRIC: No anxiety. No insomnia. No ADD.   DRUG ALLERGIES:   Allergies  Allergen Reactions  . Ace Inhibitors Cough  . Amoxicillin Rash    Has patient had a PCN reaction causing immediate rash, facial/tongue/throat swelling, SOB or lightheadedness with hypotension: No Has patient had a PCN reaction causing severe rash involving mucus membranes or skin necrosis: No Has patient had a PCN reaction that required hospitalization: No Has patient had a PCN reaction occurring within the last 10 years: No If all of the above answers are "NO", then may proceed with Cephalosporin use.   [place] Kitchen Benicar [Olmesartan] Rash    Itching rash  . Codeine Sulfate Rash  . Levaquin [Levofloxacin In D5w] Rash     Alters mental status  . Penicillin V Potassium Rash    Has patient had a PCN reaction causing immediate rash, facial/tongue/throat swelling, SOB or lightheadedness with hypotension: No Has patient had a PCN reaction causing severe rash involving mucus membranes or skin necrosis: No Has patient had a PCN reaction that required hospitalization: No Has patient had a PCN reaction occurring within the last 10 years: No If all of the above answers are "NO", then may proceed with Cephalosporin use.   . Sulfa Antibiotics Rash    VITALS:  Blood pressure (!) 167/69, pulse 85, temperature 99 F (37.2 C), temperature source Oral, resp. rate 16, height 5' (1.524 m), weight 75.8 kg, SpO2 92 %.  PHYSICAL EXAMINATION:   Physical Exam  GENERAL:  78 y.o.-year-old patient lying in the bed with no acute distress.  EYES: Pupils equal, round, reactive to light and accommodation. No scleral icterus. Extraocular muscles intact.  HEENT: Head atraumatic, normocephalic. Oropharynx and nasopharynx clear.  NECK:  Supple, no jugular venous distention. No thyroid enlargement, no tenderness.  LUNGS: Normal breath sounds bilaterally, no wheezing, rales, rhonchi. No use of accessory muscles of respiration.  CARDIOVASCULAR: S1, S2 normal. No murmurs, rubs, or gallops.  ABDOMEN: Soft, decreased tenderness around umbilicus, nondistended. Bowel sounds present. No organomegaly or mass.  EXTREMITIES: No cyanosis, clubbing or edema b/l.    NEUROLOGIC: Cranial nerves II through XII are intact. No focal Motor or sensory deficits b/l.   PSYCHIATRIC: The patient is alert and oriented x 3.  SKIN: No obvious rash, lesion, or ulcer.   LABORATORY PANEL:  CBC Recent Labs  Lab 07/05/19 1251  WBC 8.1  HGB 11.1*  HCT 35.5*  PLT 217   ------------------------------------------------------------------------------------------------------------------ Chemistries  Recent Labs  Lab 07/02/19 1435  07/05/19 1251  NA 138   <  > 137  K 3.3*   < > 3.4*  CL 102   < > 105  CO2 24   < > 23  GLUCOSE 114*   < > 172*  BUN 19   < > 12  CREATININE 0.77   < > 0.88  CALCIUM 9.8   < > 9.1  MG 1.8  --   --   AST 23  --   --   ALT 10  --   --   ALKPHOS 47  --   --   BILITOT 0.7  --   --    < > = values in this interval not displayed.   ------------------------------------------------------------------------------------------------------------------  Cardiac Enzymes No results for input(s): TROPONINI in the last 168 hours. ------------------------------------------------------------------------------------------------------------------  RADIOLOGY:  No results found.   ASSESSMENT AND PLAN:   Erin Sutton  is a 79 y.o. 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  1.  Acute sigmoid diverticulitis Recurrent Improving Status post surgery evaluation No surgery recommended  patient on IV Rocephin and Flagyl antibiotics Monitor WBC count GI consult and f/u appreciated  2.  Intractable nausea vomiting and abdominal pain-symptoms started even before starting doxycycline. Improved -No other new medications.    Meloxicam on hold -  IV fluids and symptomatic treatment -Endoscopy deferred - proton pump inhibitor -COVID-19 test is negative  3.  Hypokalemia-replaced  4.  Hypertension-continue home medications.  Patient on Norvasc and Coreg  5.  Hypothyroidism-on Synthroid  6.  DVT prophylaxis-Lovenox  7.Ambulatory dysfunction : PT evaluation appreciated SNF placement recommended  8.Acute Hypokalemia Replace potassium   9.  Social worker follow-up for SNF placement [facility] [facility] [employer] authorization.  All the records are reviewed and case discussed with Care  Management/Social Worker. Management plans discussed with the patient, family and they are in agreement.  CODE STATUS: Full code  DVT Prophylaxis: SCDs  TOTAL TIME TAKING CARE OF THIS PATIENT: 34 minutes.   POSSIBLE D/C IN 1 to 2 DAYS, DEPENDING ON CLINICAL CONDITION.  Saundra Shelling M.D on 07/06/2019 at 10:13 AM  Between 7am to 6pm - Pager - [phone]  After 6pm go to [email or web address] - password [email or web address] [employer] [facility]  Office  [phone]  CC: Primary care physician; Baxter Hire, MD  Note: This dictation was prepared with Dragon dictation along with smaller phrase technology. Any transcriptional errors that result from this process are unintentional.

## 2019-07-06 NOTE — TOC Progression Note (Signed)
Transition of Care [facility]) - Progression Note    Patient Details  Name: Erin Sutton MRN: [id number] Date of Birth: 1941-06-18  Transition of Care [facility]) CM/SW Contact  Shelbie Hutching, RN Phone Number: 07/06/2019, 10:44 AM  Clinical Narrative:    [place] authorization approved Reference # [id number] Authorization # [id number] Start date 07/06/19 Next Review 07/08/19 Care Coordinator Christie Beckers- fax [phone]  Expected Discharge Plan: [place] Barriers to Discharge: Continued Medical Work up  Expected Discharge Plan and Services Expected Discharge Plan: [place]   Discharge Planning Services: CM Consult Post Acute Care Choice: [place] arrangements for the past 2 months: Mobile Home Expected Discharge Date: 07/05/19                         HH Arranged: RN, PT, OT, Nurse's Aide, Social Work [employer] Agency: [place] ([place]) Date [place]: 07/05/19 Time [place]: [phone] Representative spoke with at [place]: [place] (SDOH) Interventions    Readmission Risk Interventions No flowsheet data found.

## 2019-07-06 NOTE — Discharge Summary (Signed)
[facility] NAME: Erin Sutton    MR#:  [id number]  DATE OF BIRTH:  [birth year]  DATE OF ADMISSION:  07/02/2019 ADMITTING PHYSICIAN: Gladstone Lighter, MD  DATE OF DISCHARGE: 07/06/2019  PRIMARY CARE PHYSICIAN: Baxter Hire, MD   ADMISSION DIAGNOSIS:  Hypokalemia [E87.6] Nausea [R11.0]  DISCHARGE DIAGNOSIS:  Active Problems:   Intractable nausea and vomiting   Nausea Acute hypokalemia Acute sigmoid diverticulitis  SECONDARY DIAGNOSIS:   Past Medical History:  Diagnosis Date  . Anxiety   . Arthritis   . Chronic kidney disease   . Constipation   . GERD (gastroesophageal reflux disease)   . Hypertension   . Hypothyroidism   . Osteoporosis   . Sleep apnea   . Urinary incontinence      ADMITTING HISTORY Erin Sutton  is a 78 y.o. 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 Patient states that she is independent at baseline and lives at home by herself.  She has seen her PCP about 2 weeks ago for right upper quadrant abdominal pain and nausea vomiting.  She has had a history of constipation given her hypercalcemia from primary hyperparathyroidism.  Take stool softeners at home.  Denies any recent constipation.  She had imaging studies done and was started on Flagyl and doxycycline about 10 days ago as outpatient.  She feels that her nausea and vomiting has not improved at all.  She had extremely poor oral intake in the last couple of weeks.  She has known history of GERD, takes meloxicam at home daily.  Now the pain is more in the epigastric region with worsening heartburn symptoms and still has right upper quadrant pain.  No diarrhea.  No chest pain or dyspnea or fevers or sick contacts.  No  recent travel. Labs here showing hypokalemia.  CT of the abdomen showing diverticulosis, gallbladder with no stones or swelling around it.  No obstruction noted.  HOSPITAL COURSE:  Was admitted to medical floor.  Patient started on antiemetic medications intravenously and IV fluids.  Gastroenterology evaluated the patient and deferred endoscopy.  CT abdomen revealed sigmoid diverticulitis patient started on IV Rocephin and Flagyl antibiotics.  Doxycycline has been stopped.  Patient's abdominal pain resolved.  Tolerated diet well.  Nausea and vomiting improved.  Leukocytosis also resolved.  Cultures did not reveal any growth.  Patient was seen by surgical service for diverticulitis but did not recommend any intervention.  Patient seen by physical therapy.  Has generalized weakness and ambulatory dysfunction and recommended rehab placement.  Patient has bed at [facility].  CONSULTS OBTAINED:  Gastroenterology consult Surgery consult  DRUG ALLERGIES:   Allergies  Allergen Reactions  . Ace Inhibitors Cough  . Amoxicillin Rash    Has patient had a PCN reaction causing immediate rash, facial/tongue/throat swelling, SOB or lightheadedness with hypotension: No Has patient had a PCN reaction causing severe rash involving mucus membranes or skin necrosis: No Has patient had a PCN reaction that required hospitalization: No Has patient had a PCN reaction occurring within the last 10 years: No If all of the above answers are "NO", then may proceed with Cephalosporin use.   Marland Kitchen Benicar [Olmesartan] Rash    Itching rash  . Codeine Sulfate Rash  . Levaquin [Levofloxacin In  D5w] Rash    Alters mental status  . Penicillin V Potassium Rash    Has patient had a PCN reaction causing immediate rash, facial/tongue/throat swelling, SOB or lightheadedness with hypotension: No Has patient had a PCN reaction causing severe rash involving mucus membranes or skin necrosis: No Has patient had a PCN  reaction that required hospitalization: No Has patient had a PCN reaction occurring within the last 10 years: No If all of the above answers are "NO", then may proceed with Cephalosporin use.   . Sulfa Antibiotics Rash    DISCHARGE MEDICATIONS:   Allergies as of 07/06/2019      Reactions   Ace Inhibitors Cough   Amoxicillin Rash   Has patient had a PCN reaction causing immediate rash, facial/tongue/throat swelling, SOB or lightheadedness with hypotension: No Has patient had a PCN reaction causing severe rash involving mucus membranes or skin necrosis: No Has patient had a PCN reaction that required hospitalization: No Has patient had a PCN reaction occurring within the last 10 years: No If all of the above answers are "NO", then may proceed with Cephalosporin use.   Benicar [olmesartan] Rash   Itching rash   Codeine Sulfate Rash   Levaquin [levofloxacin In D5w] Rash   Alters mental status   Penicillin V Potassium Rash   Has patient had a PCN reaction causing immediate rash, facial/tongue/throat swelling, SOB or lightheadedness with hypotension: No Has patient had a PCN reaction causing severe rash involving mucus membranes or skin necrosis: No Has patient had a PCN reaction that required hospitalization: No Has patient had a PCN reaction occurring within the last 10 years: No If all of the above answers are "NO", then may proceed with Cephalosporin use.   Sulfa Antibiotics Rash      Medication List    STOP taking these medications   doxycycline 100 MG tablet Commonly known as: VIBRA-TABS   meloxicam 15 MG tablet Commonly known as: MOBIC     TAKE these medications   amLODipine 10 MG tablet Commonly known as: NORVASC Take 10 mg by mouth daily.   aspirin EC 81 MG tablet Take 81 mg by mouth daily. What changed: Another medication with the same name was removed. Continue taking this medication, and follow the directions you see here.   carvedilol 12.5 MG tablet Commonly  known as: COREG Take 12.5 mg by mouth 2 (two) times daily.   cholecalciferol 25 MCG (1000 UT) tablet Commonly known as: VITAMIN D3 Take 1,000 Units by mouth daily.   cyanocobalamin 1000 MCG/ML injection Commonly known as: (VITAMIN B-12) Inject 1,000 mcg into the muscle every 30 (thirty) days.   docusate sodium 100 MG capsule Commonly known as: Colace Take 1 capsule (100 mg total) by mouth 2 (two) times daily.   feeding supplement (ENSURE ENLIVE) Liqd Take 237 mLs by mouth 2 (two) times daily between meals.   isosorbide mononitrate 30 MG 24 hr tablet Commonly known as: IMDUR Take 30 mg by mouth daily.   levothyroxine 50 MCG tablet Commonly known as: SYNTHROID Take 50 mcg by mouth daily before breakfast.   LORazepam 2 MG tablet Commonly known as: ATIVAN Take 2 mg by mouth every 8 (eight) hours as needed for anxiety.   magnesium oxide 400 MG tablet Commonly known as: MAG-OX Take 400 mg by mouth daily.   metroNIDAZOLE 500 MG tablet Commonly known as: Flagyl Take 1 tablet (500 mg total) by mouth 3 (three) times daily for 4 days. What changed: Another medication with  the same name was added. Make sure you understand how and when to take each.   metroNIDAZOLE 500 MG tablet Commonly known as: FLAGYL Take 1 tablet (500 mg total) by mouth 3 (three) times daily for 5 days. What changed: You were already taking a medication with the same name, and this prescription was added. Make sure you understand how and when to take each.   pantoprazole 40 MG tablet Commonly known as: PROTONIX Take 40 mg by mouth daily.   potassium chloride SA 20 MEQ tablet Commonly known as: K-DUR Take 20 mEq by mouth every morning.   pravastatin 40 MG tablet Commonly known as: PRAVACHOL Take 40 mg by mouth at bedtime.   sertraline 100 MG tablet Commonly known as: ZOLOFT Take 100 mg by mouth at bedtime.   simethicone 80 MG chewable tablet Commonly known as: MYLICON Chew 1 tablet (80 mg total)  by mouth every 6 (six) hours as needed for flatulence.   SYSTANE OP Place 1 drop into both eyes 2 (two) times daily.            Durable Medical Equipment  (From admission, onward)         Start     Ordered   07/05/19 0906  For home use only DME Walker rolling  Once    Question:  Patient needs a walker to treat with the following condition  Answer:  Ambulatory dysfunction   07/05/19 0906          Today  Patient seen and evaluated today No abdominal pain No nausea vomiting Hemodynamically stable VITAL SIGNS:  Blood pressure (!) 167/69, pulse 85, temperature 99 F (37.2 C), temperature source Oral, resp. rate 16, height 5' (1.524 m), weight 75.8 kg, SpO2 92 %.  I/O:    Intake/Output Summary (Last 24 hours) at 07/06/2019 1147 Last data filed at 07/06/2019 0900 Gross per 24 hour  Intake 600 ml  Output -  Net 600 ml    PHYSICAL EXAMINATION:  Physical Exam  GENERAL:  78 y.o.-year-old patient lying in the bed with no acute distress.  LUNGS: Normal breath sounds bilaterally, no wheezing, rales,rhonchi or crepitation. No use of accessory muscles of respiration.  CARDIOVASCULAR: S1, S2 normal. No murmurs, rubs, or gallops.  ABDOMEN: Soft, non-tender, non-distended. Bowel sounds present. No organomegaly or mass.  NEUROLOGIC: Moves all 4 extremities. PSYCHIATRIC: The patient is alert and oriented x 3.  SKIN: No obvious rash, lesion, or ulcer.   DATA REVIEW:   CBC Recent Labs  Lab 07/05/19 1251  WBC 8.1  HGB 11.1*  HCT 35.5*  PLT 217    Chemistries  Recent Labs  Lab 07/02/19 1435  07/05/19 1251  NA 138   < > 137  K 3.3*   < > 3.4*  CL 102   < > 105  CO2 24   < > 23  GLUCOSE 114*   < > 172*  BUN 19   < > 12  CREATININE 0.77   < > 0.88  CALCIUM 9.8   < > 9.1  MG 1.8  --   --   AST 23  --   --   ALT 10  --   --   ALKPHOS 47  --   --   BILITOT 0.7  --   --    < > = values in this interval not displayed.    Cardiac Enzymes No results for input(s):  TROPONINI in the last 168 hours.  Microbiology Results  Results for orders placed or performed during the hospital encounter of 07/02/19  SARS Coronavirus 2 [facility] order, Performed in [facility] hospital lab) Nasopharyngeal Nasopharyngeal Swab     Status: None   Collection Time: 07/02/19  3:53 PM   Specimen: Nasopharyngeal Swab  Result Value Ref Range Status   SARS Coronavirus 2 NEGATIVE NEGATIVE Final    Comment: (NOTE) If result is NEGATIVE SARS-CoV-2 target nucleic acids are NOT DETECTED. The SARS-CoV-2 RNA is generally detectable in upper and lower  respiratory specimens during the acute phase of infection. The lowest  concentration of SARS-CoV-2 viral copies this assay can detect is 250  copies / mL. A negative result does not preclude SARS-CoV-2 infection  and should not be used as the sole basis for treatment or other  patient management decisions.  A negative result may occur with  improper specimen collection / handling, submission of specimen other  than nasopharyngeal swab, presence of viral mutation(s) within the  areas targeted by this assay, and inadequate number of viral copies  (<250 copies / mL). A negative result must be combined with clinical  observations, patient history, and epidemiological information. If result is POSITIVE SARS-CoV-2 target nucleic acids are DETECTED. The SARS-CoV-2 RNA is generally detectable in upper and lower  respiratory specimens dur ing the acute phase of infection.  Positive  results are indicative of active infection with SARS-CoV-2.  Clinical  correlation with patient history and other diagnostic information is  necessary to determine patient infection status.  Positive results do  not rule out bacterial infection or co-infection with other viruses. If result is PRESUMPTIVE POSTIVE SARS-CoV-2 nucleic acids MAY BE PRESENT.   A presumptive positive result was obtained on the submitted specimen  and confirmed on repeat testing.   While 2019 novel coronavirus  (SARS-CoV-2) nucleic acids may be present in the submitted sample  additional confirmatory testing may be necessary for epidemiological  and / or clinical management purposes  to differentiate between  SARS-CoV-2 and other Sarbecovirus currently known to infect humans.  If clinically indicated additional testing with an alternate test  methodology [phone]) is advised. The SARS-CoV-2 RNA is generally  detectable in upper and lower respiratory sp ecimens during the acute  phase of infection. The expected result is Negative. Fact Sheet for Patients:  StrictlyIdeas.no Fact Sheet for Healthcare Providers: BankingDealers.co.za This test is not yet approved or cleared by the [place] FDA and has been authorized for detection and/or diagnosis of SARS-CoV-2 by FDA under an Emergency Use Authorization (EUA).  This EUA will remain in effect (meaning this test can be used) for the duration of the COVID-19 declaration under Section 564(b)(1) of the Act, 21 U.S.C. section 360bbb-3(b)(1), unless the authorization is terminated or revoked sooner. Performed at [facility], [address] [place], [place] [address]     RADIOLOGY:  No results found.  Follow up with PCP in 1 week.  Management plans discussed with the patient, family and they are in agreement.  CODE STATUS: Full code    Code Status Orders  (From admission, onward)         Start     Ordered   07/02/19 2210  Full code  Continuous     07/02/19 2209        Code Status History    Date Active Date Inactive Code Status Order ID Comments User Context   07/01/2018 0004 07/03/2018 0105 Full Code [id number]  Lance Coon, MD Inpatient   12/17/2016 1751 12/21/2016  0397 Full Code [id number]  Demetrios Loll, MD Inpatient   Advance Care Planning Activity    Advance Directive Documentation     Most Recent Value  Type of Advance Directive  Healthcare  Power of Attorney, Living will  Pre-existing out of facility DNR order (yellow form or pink MOST form)  -  "MOST" Form in Place?  -      TOTAL TIME TAKING CARE OF THIS PATIENT ON DAY OF DISCHARGE: more than 34 minutes.   Saundra Shelling M.D on 07/06/2019 at 11:47 AM  Between 7am to 6pm - Pager - [phone]  After 6pm go to [email or web address] - password [email or web address] [facility]  Office  [phone]  CC: Primary care physician; Baxter Hire, MD  Note: This dictation was prepared with Dragon dictation along with smaller phrase technology. Any transcriptional errors that result from this process are unintentional.

## 2019-07-06 NOTE — TOC Transition Note (Signed)
Transition of Care [facility]) - CM/SW Discharge Note   Patient Details  Name: Erin Sutton MRN: [id number] Date of Birth: Sep 20, 1941  [facility] CM/SW Contact:  Shelbie Hutching, RN Phone Number: 07/06/2019, 10:54 AM   Clinical Narrative:     Patient is ready for discharge, patient has chosen a bed at [facility] and will be going to room 508.  Bedside RN will call report to [phone].  Patient will transport via [employer] crew.  Daughter Tammy updated on discharge plan.   Final next level of care: Skilled Nursing Facility Barriers to Discharge: Barriers Resolved   Patient Goals and CMS Choice   CMS Medicare.gov Compare Post Acute Care list provided to:: Patient Choice offered to / list presented to : Patient  Discharge Placement PASRR number recieved: 07/05/19            Patient chooses bed at: [facility] Patient to be transferred to facility by: [facility] Name of family member notified: [name] Patient and family notified of of transfer: 07/06/19  Discharge Plan and Services   Discharge Planning Services: CM Consult Post Acute Care Choice: Home Health                    HH Arranged: RN, PT, OT, Nurse's Aide, Social Work [facility] Agency: [place] ([place]) Date [place]: 07/05/19 Time [place]: [phone] Representative spoke with at [place]: [place] (SDOH) Interventions     Readmission Risk Interventions No flowsheet data found.

## 2019-07-07 DIAGNOSIS — K5792 Diverticulitis of intestine, part unspecified, without perforation or abscess without bleeding: Secondary | ICD-10-CM | POA: Insufficient documentation

## 2019-07-14 DIAGNOSIS — Z86711 Personal history of pulmonary embolism: Secondary | ICD-10-CM | POA: Diagnosis present

## 2019-07-14 HISTORY — DX: Personal history of pulmonary embolism: Z86.711

## 2019-07-15 ENCOUNTER — Inpatient Hospital Stay
Admission: EM | Admit: 2019-07-15 | Discharge: 2019-07-20 | DRG: 175 | Disposition: A | Discharge status: Home / Self Care | Payer: Medicare PPO | Attending: Internal Medicine | Admitting: Internal Medicine

## 2019-07-15 ENCOUNTER — Emergency Department: Payer: Medicare PPO

## 2019-07-15 ENCOUNTER — Other Ambulatory Visit: Payer: Self-pay

## 2019-07-15 DIAGNOSIS — Z888 Allergy status to other drugs, medicaments and biological substances status: Secondary | ICD-10-CM

## 2019-07-15 DIAGNOSIS — Z881 Allergy status to other antibiotic agents status: Secondary | ICD-10-CM

## 2019-07-15 DIAGNOSIS — Z7982 Long term (current) use of aspirin: Secondary | ICD-10-CM

## 2019-07-15 DIAGNOSIS — R32 Unspecified urinary incontinence: Secondary | ICD-10-CM | POA: Diagnosis present

## 2019-07-15 DIAGNOSIS — Z885 Allergy status to narcotic agent status: Secondary | ICD-10-CM

## 2019-07-15 DIAGNOSIS — I2609 Other pulmonary embolism with acute cor pulmonale: Principal | ICD-10-CM | POA: Diagnosis present

## 2019-07-15 DIAGNOSIS — Z833 Family history of diabetes mellitus: Secondary | ICD-10-CM

## 2019-07-15 DIAGNOSIS — R7989 Other specified abnormal findings of blood chemistry: Secondary | ICD-10-CM | POA: Diagnosis not present

## 2019-07-15 DIAGNOSIS — M810 Age-related osteoporosis without current pathological fracture: Secondary | ICD-10-CM | POA: Diagnosis present

## 2019-07-15 DIAGNOSIS — J9601 Acute respiratory failure with hypoxia: Secondary | ICD-10-CM | POA: Diagnosis present

## 2019-07-15 DIAGNOSIS — Z880 Allergy status to penicillin: Secondary | ICD-10-CM

## 2019-07-15 DIAGNOSIS — D638 Anemia in other chronic diseases classified elsewhere: Secondary | ICD-10-CM | POA: Diagnosis present

## 2019-07-15 DIAGNOSIS — F419 Anxiety disorder, unspecified: Secondary | ICD-10-CM | POA: Diagnosis present

## 2019-07-15 DIAGNOSIS — G4730 Sleep apnea, unspecified: Secondary | ICD-10-CM | POA: Diagnosis present

## 2019-07-15 DIAGNOSIS — Z6833 Body mass index (BMI) 33.0-33.9, adult: Secondary | ICD-10-CM

## 2019-07-15 DIAGNOSIS — I2699 Other pulmonary embolism without acute cor pulmonale: Secondary | ICD-10-CM

## 2019-07-15 DIAGNOSIS — N189 Chronic kidney disease, unspecified: Secondary | ICD-10-CM | POA: Diagnosis present

## 2019-07-15 DIAGNOSIS — Z79899 Other long term (current) drug therapy: Secondary | ICD-10-CM

## 2019-07-15 DIAGNOSIS — Z8249 Family history of ischemic heart disease and other diseases of the circulatory system: Secondary | ICD-10-CM

## 2019-07-15 DIAGNOSIS — Z882 Allergy status to sulfonamides status: Secondary | ICD-10-CM

## 2019-07-15 DIAGNOSIS — R0600 Dyspnea, unspecified: Secondary | ICD-10-CM

## 2019-07-15 DIAGNOSIS — I82412 Acute embolism and thrombosis of left femoral vein: Secondary | ICD-10-CM | POA: Diagnosis present

## 2019-07-15 DIAGNOSIS — I129 Hypertensive chronic kidney disease with stage 1 through stage 4 chronic kidney disease, or unspecified chronic kidney disease: Secondary | ICD-10-CM | POA: Diagnosis present

## 2019-07-15 DIAGNOSIS — Z20828 Contact with and (suspected) exposure to other viral communicable diseases: Secondary | ICD-10-CM | POA: Diagnosis present

## 2019-07-15 DIAGNOSIS — E039 Hypothyroidism, unspecified: Secondary | ICD-10-CM | POA: Diagnosis present

## 2019-07-15 DIAGNOSIS — I82432 Acute embolism and thrombosis of left popliteal vein: Secondary | ICD-10-CM | POA: Diagnosis present

## 2019-07-15 DIAGNOSIS — E669 Obesity, unspecified: Secondary | ICD-10-CM | POA: Diagnosis present

## 2019-07-15 DIAGNOSIS — Z825 Family history of asthma and other chronic lower respiratory diseases: Secondary | ICD-10-CM

## 2019-07-15 DIAGNOSIS — Z79890 Hormone replacement therapy (postmenopausal): Secondary | ICD-10-CM

## 2019-07-15 DIAGNOSIS — K219 Gastro-esophageal reflux disease without esophagitis: Secondary | ICD-10-CM | POA: Diagnosis present

## 2019-07-15 LAB — CBC WITH DIFFERENTIAL/PLATELET
Abs Immature Granulocytes: 0.08 10*3/uL — ABNORMAL HIGH (ref 0.00–0.07)
Basophils Absolute: 0 10*3/uL (ref 0.0–0.1)
Basophils Relative: 0 %
Eosinophils Absolute: 0.1 10*3/uL (ref 0.0–0.5)
Eosinophils Relative: 1 %
HCT: 34.4 % — ABNORMAL LOW (ref 36.0–46.0)
Hemoglobin: 10.9 g/dL — ABNORMAL LOW (ref 12.0–15.0)
Immature Granulocytes: 1 %
Lymphocytes Relative: 10 %
Lymphs Abs: 1 10*3/uL (ref 0.7–4.0)
MCH: 27.9 pg (ref 26.0–34.0)
MCHC: 31.7 g/dL (ref 30.0–36.0)
MCV: 88.2 fL (ref 80.0–100.0)
Monocytes Absolute: 1.1 10*3/uL — ABNORMAL HIGH (ref 0.1–1.0)
Monocytes Relative: 11 %
Neutro Abs: 7.9 10*3/uL — ABNORMAL HIGH (ref 1.7–7.7)
Neutrophils Relative %: 77 %
Platelets: 261 10*3/uL (ref 150–400)
RBC: 3.9 MIL/uL (ref 3.87–5.11)
RDW: 17.2 % — ABNORMAL HIGH (ref 11.5–15.5)
WBC: 10.2 10*3/uL (ref 4.0–10.5)
nRBC: 0 % (ref 0.0–0.2)

## 2019-07-15 LAB — TROPONIN I (HIGH SENSITIVITY): Troponin I (High Sensitivity): 10 ng/L (ref <18)

## 2019-07-15 LAB — PROTIME-INR
INR: 1.3 — ABNORMAL HIGH (ref 0.8–1.2)
Prothrombin Time: 15.8 seconds — ABNORMAL HIGH (ref 11.4–15.2)

## 2019-07-15 LAB — COMPREHENSIVE METABOLIC PANEL
ALT: 13 U/L (ref 0–44)
AST: 33 U/L (ref 15–41)
Albumin: 3 g/dL — ABNORMAL LOW (ref 3.5–5.0)
Alkaline Phosphatase: 53 U/L (ref 38–126)
Anion gap: 10 (ref 5–15)
BUN: 11 mg/dL (ref 8–23)
CO2: 26 mmol/L (ref 22–32)
Calcium: 9.3 mg/dL (ref 8.9–10.3)
Chloride: 99 mmol/L (ref 98–111)
Creatinine, Ser: 0.77 mg/dL (ref 0.44–1.00)
GFR calc Af Amer: >60 mL/min (ref >60)
GFR calc non Af Amer: >60 mL/min (ref >60)
Glucose, Bld: 121 mg/dL — ABNORMAL HIGH (ref 70–99)
Potassium: 3.9 mmol/L (ref 3.5–5.1)
Sodium: 135 mmol/L (ref 135–145)
Total Bilirubin: 0.7 mg/dL (ref 0.3–1.2)
Total Protein: 6.6 g/dL (ref 6.5–8.1)

## 2019-07-15 LAB — APTT: aPTT: 49 seconds — ABNORMAL HIGH (ref 24–36)

## 2019-07-15 MED ORDER — IOHEXOL 350 MG/ML SOLN
75.0000 mL | Freq: Once | INTRAVENOUS | Status: AC | PRN
  Administered 2019-07-15: 75 mL via INTRAVENOUS

## 2019-07-15 NOTE — ED Provider Notes (Signed)
[facility] Emergency Department Provider Note   ____________________________________________   First MD Initiated Contact with Patient 07/15/19 2024     (approximate)  I have reviewed the triage vital signs and the nursing notes.   HISTORY  Chief Complaint Abnormal Lab    HPI Erin Sutton is a 78 y.o. female who reports onset of shortness of breath and some pleuritic chest pain in the right side of the chest this morning.  This is all gone now although she is little but she still short of breath.  Her d-dimer was 2000.  She was sent here for further evaluation.        Past Medical History:  Diagnosis Date   Anxiety    Arthritis    Chronic kidney disease    Constipation    GERD (gastroesophageal reflux disease)    Hypertension    Hypothyroidism    Osteoporosis    Sleep apnea    Urinary incontinence     Patient Active Problem List   Diagnosis Date Noted   Nausea    Intractable nausea and vomiting 07/02/2019   Confusion 06/30/2018   Hypothyroidism 06/30/2018   HTN (hypertension) 06/30/2018   GERD (gastroesophageal reflux disease) 06/30/2018   Anxiety 06/30/2018   Accelerated hypertension 06/30/2018   Sepsis ([place]) 12/17/2016    Past Surgical History:  Procedure Laterality Date   BREAST BIOPSY Right    neg/stereo   BREAST BIOPSY Left    neg/stereo   BREAST EXCISIONAL BIOPSY Right    neg   COLONOSCOPY WITH PROPOFOL N/A 07/02/2015   Procedure: COLONOSCOPY WITH PROPOFOL;  Surgeon: Manya Silvas, MD;  Location: [place];  Service: Endoscopy;  Laterality: N/A;   CORRECTION HAMMER TOE Right    ESOPHAGOGASTRODUODENOSCOPY (EGD) WITH PROPOFOL N/A 07/02/2015   Procedure: ESOPHAGOGASTRODUODENOSCOPY (EGD) WITH PROPOFOL;  Surgeon: Manya Silvas, MD;  Location[facility] ENDOSCOPY;  Service: Endoscopy;  Laterality: N/A;   JOINT REPLACEMENT Left    knee   REPLACEMENT TOTAL KNEE Left    SKIN BIOPSY Right      Prior to Admission medications   Medication Sig Start Date End Date Taking? Authorizing Provider  amLODipine (NORVASC) 10 MG tablet Take 10 mg by mouth daily.    [provider]  aspirin EC 81 MG tablet Take 81 mg by mouth daily.    [provider]  carvedilol (COREG) 12.5 MG tablet Take 12.5 mg by mouth 2 (two) times daily.    [provider]  cholecalciferol (VITAMIN D3) 25 MCG (1000 UT) tablet Take 1,000 Units by mouth daily.    [provider]  cyanocobalamin (,VITAMIN B-12,) 1000 MCG/ML injection Inject 1,000 mcg into the muscle every 30 (thirty) days.    [provider]  docusate sodium (COLACE) 100 MG capsule Take 1 capsule (100 mg total) by mouth 2 (two) times daily. 07/05/19 07/04/20  Saundra Shelling, MD  feeding supplement, ENSURE ENLIVE, (ENSURE ENLIVE) LIQD Take 237 mLs by mouth 2 (two) times daily between meals. 07/05/19   Saundra Shelling, MD  isosorbide mononitrate (IMDUR) 30 MG 24 hr tablet Take 30 mg by mouth daily.    [provider]  levothyroxine (SYNTHROID, LEVOTHROID) 50 MCG tablet Take 50 mcg by mouth daily before breakfast.    [provider]  LORazepam (ATIVAN) 2 MG tablet Take 2 mg by mouth every 8 (eight) hours as needed for anxiety.     [provider]  magnesium oxide (MAG-OX) 400 MG tablet Take 400  mg by mouth daily.    [provider]  pantoprazole (PROTONIX) 40 MG tablet Take 40 mg by mouth daily.    [provider]  Polyethyl Glycol-Propyl Glycol (SYSTANE OP) Place 1 drop into both eyes 2 (two) times daily.    [provider]  potassium chloride SA (K-DUR,KLOR-CON) 20 MEQ tablet Take 20 mEq by mouth every morning.    [provider]  pravastatin (PRAVACHOL) 40 MG tablet Take 40 mg by mouth at bedtime.     [provider]  sertraline (ZOLOFT) 100 MG tablet Take 100 mg by mouth at bedtime.     [provider]  simethicone (MYLICON) 80 MG  chewable tablet Chew 1 tablet (80 mg total) by mouth every 6 (six) hours as needed for flatulence. 07/06/19   Saundra Shelling, MD    Allergies Ace inhibitors, Amoxicillin, Benicar [olmesartan], Codeine sulfate, Levaquin [levofloxacin in d5w], Penicillin v potassium, and Sulfa antibiotics  Family History  Problem Relation Age of Onset   Hypertension Mother    Diabetes Mother    Heart attack Mother    Asthma Mother    Peripheral vascular disease Mother    Heart attack Father    Hypertension Father    Diabetes Father     Social History Social History   Tobacco Use   Smoking status: Never Smoker   Smokeless tobacco: Never Used  Substance Use Topics   Alcohol use: No   Drug use: No    Review of Systems  Constitutional: No fever/chills Eyes: No visual changes. ENT: No sore throat. Cardiovascular: See HPI Respiratory: See HPI. Gastrointestinal: No abdominal pain.  No nausea, no vomiting.  No diarrhea.  No constipation. Genitourinary: Negative for dysuria. Musculoskeletal: Negative for back pain. Skin: Negative for rash. Neurological: Negative for headaches, focal weakness   ____________________________________________   PHYSICAL EXAM:  VITAL SIGNS: ED Triage Vitals  Enc Vitals Group     BP 07/15/19 2013 138/68     Pulse Rate 07/15/19 2013 87     Resp 07/15/19 2013 (!) 24     Temp 07/15/19 2013 99.5 F (37.5 C)     Temp Source 07/15/19 2013 Oral     SpO2 07/15/19 2011 98 %     Weight 07/15/19 2014 167 lb (75.8 kg)     Height 07/15/19 2014 5' (1.524 m)     Head Circumference --      Peak Flow --      Pain Score 07/15/19 2013 6     Pain Loc --      Pain Edu? --      Excl. in [place]? --     Constitutional: Alert and oriented. Well appearing and in no acute distress. Eyes: Conjunctivae are normal.  Head: Atraumatic. Nose: No congestion/rhinnorhea. Mouth/Throat: Mucous membranes are moist.  Oropharynx non-erythematous. Neck: No stridor.    Cardiovascular: Normal rate, regular rhythm. Grossly normal heart sounds.  Good peripheral circulation. Respiratory: Normal respiratory effort.  No retractions. Lungs CTAB. Gastrointestinal: Soft and nontender. No distention. No abdominal bruits. No CVA tenderness. Musculoskeletal: No lower extremity tenderness trace edema.  . Neurologic:  Normal speech and language. No gross focal neurologic deficits are appreciated. N Skin:  Skin is warm, dry and intact. No rash noted.   ____________________________________________   LABS (all labs ordered are listed, but only abnormal results are displayed)  Labs Reviewed  COMPREHENSIVE METABOLIC PANEL - Abnormal; Notable for the following components:      Result Value   Glucose,  Bld 121 (*)    Albumin 3.0 (*)    All other components within normal limits  CBC WITH DIFFERENTIAL/PLATELET - Abnormal; Notable for the following components:   Hemoglobin 10.9 (*)    HCT 34.4 (*)    RDW 17.2 (*)    Neutro Abs 7.9 (*)    Monocytes Absolute 1.1 (*)    Abs Immature Granulocytes 0.08 (*)    All other components within normal limits  PROTIME-INR - Abnormal; Notable for the following components:   Prothrombin Time 15.8 (*)    INR 1.3 (*)    All other components within normal limits  APTT - Abnormal; Notable for the following components:   aPTT 49 (*)    All other components within normal limits  TROPONIN I (HIGH SENSITIVITY)  TROPONIN I (HIGH SENSITIVITY)   ____________________________________________  EKG   ____________________________________________  RADIOLOGY  ED MD interpretation:   Official radiology report(s): Dg Chest Portable 1 View  Result Date: 07/15/2019 CLINICAL DATA:  Shortness of breath. EXAM: PORTABLE CHEST 1 VIEW COMPARISON:  Radiograph 09/02/2018 FINDINGS: Chronic elevation of right hemidiaphragm. Unchanged heart size and mediastinal contours. Aortic atherosclerosis. Streaky bibasilar opacities without confluent airspace  disease. No pleural effusion or pneumothorax. Degenerative change of the left shoulder. IMPRESSION: Streaky bibasilar opacities. Aortic Atherosclerosis (ICD10-I70.0). Electronically Signed   By: Keith Rake M.D.   On: 07/15/2019 20:43    ____________________________________________   PROCEDURES  Procedure(s) performed (including Critical Care):  Procedures   ____________________________________________   INITIAL IMPRESSION / ASSESSMENT AND PLAN / ED COURSE  KENADI MILTNER was evaluated in Emergency Department on 07/15/2019 for the symptoms described in the history of present illness. She was evaluated in the context of the global COVID-19 pandemic, which necessitated consideration that the patient might be at risk for infection with the SARS-CoV-2 virus that causes COVID-19. Institutional protocols and algorithms that pertain to the evaluation of patients at risk for COVID-19 are in a state of rapid change based on information released by regulatory bodies including the CDC and federal and state organizations. These policies and algorithms were followed during the patient's care in the ED.    Have a lot of trouble getting an IV in the patient.  I signed the patient out following to the oncoming provider as the CT angiogram has not been done yet.      Clinical Course as of Jul 14 2333  Fri Jul 15, 2019  2231 Glucose(!): 121 [PM]    Clinical Course User Index [PM] Nena Polio, MD     ____________________________________________   FINAL CLINICAL IMPRESSION(S) / ED DIAGNOSES  Final diagnoses:  Elevated d-dimer  Dyspnea, unspecified type     ED Discharge Orders    None       Note:  This document was prepared using Dragon voice recognition software and may include unintentional dictation errors.    Nena Polio, MD 07/15/19 [phone]

## 2019-07-15 NOTE — ED Triage Notes (Signed)
Patient sent to ED for unidentified abnormal lab. Suspected blood clot in lungs.

## 2019-07-16 ENCOUNTER — Emergency Department: Payer: Medicare PPO

## 2019-07-16 ENCOUNTER — Encounter: Payer: Self-pay | Admitting: Radiology

## 2019-07-16 ENCOUNTER — Inpatient Hospital Stay
Admit: 2019-07-16 | Discharge: 2019-07-16 | Disposition: A | Discharge status: Home / Self Care | Payer: Medicare PPO | Attending: Internal Medicine | Admitting: Internal Medicine

## 2019-07-16 ENCOUNTER — Inpatient Hospital Stay: Payer: Medicare PPO

## 2019-07-16 DIAGNOSIS — Z881 Allergy status to other antibiotic agents status: Secondary | ICD-10-CM | POA: Diagnosis not present

## 2019-07-16 DIAGNOSIS — G4730 Sleep apnea, unspecified: Secondary | ICD-10-CM | POA: Diagnosis present

## 2019-07-16 DIAGNOSIS — I2609 Other pulmonary embolism with acute cor pulmonale: Secondary | ICD-10-CM | POA: Diagnosis present

## 2019-07-16 DIAGNOSIS — D638 Anemia in other chronic diseases classified elsewhere: Secondary | ICD-10-CM | POA: Diagnosis present

## 2019-07-16 DIAGNOSIS — K219 Gastro-esophageal reflux disease without esophagitis: Secondary | ICD-10-CM | POA: Diagnosis present

## 2019-07-16 DIAGNOSIS — E669 Obesity, unspecified: Secondary | ICD-10-CM | POA: Diagnosis present

## 2019-07-16 DIAGNOSIS — Z825 Family history of asthma and other chronic lower respiratory diseases: Secondary | ICD-10-CM | POA: Diagnosis not present

## 2019-07-16 DIAGNOSIS — Z6833 Body mass index (BMI) 33.0-33.9, adult: Secondary | ICD-10-CM | POA: Diagnosis not present

## 2019-07-16 DIAGNOSIS — R7989 Other specified abnormal findings of blood chemistry: Secondary | ICD-10-CM | POA: Diagnosis present

## 2019-07-16 DIAGNOSIS — Z880 Allergy status to penicillin: Secondary | ICD-10-CM | POA: Diagnosis not present

## 2019-07-16 DIAGNOSIS — I2699 Other pulmonary embolism without acute cor pulmonale: Secondary | ICD-10-CM

## 2019-07-16 DIAGNOSIS — J9601 Acute respiratory failure with hypoxia: Secondary | ICD-10-CM | POA: Diagnosis present

## 2019-07-16 DIAGNOSIS — R32 Unspecified urinary incontinence: Secondary | ICD-10-CM | POA: Diagnosis present

## 2019-07-16 DIAGNOSIS — N189 Chronic kidney disease, unspecified: Secondary | ICD-10-CM | POA: Diagnosis present

## 2019-07-16 DIAGNOSIS — Z20828 Contact with and (suspected) exposure to other viral communicable diseases: Secondary | ICD-10-CM | POA: Diagnosis present

## 2019-07-16 DIAGNOSIS — M810 Age-related osteoporosis without current pathological fracture: Secondary | ICD-10-CM | POA: Diagnosis present

## 2019-07-16 DIAGNOSIS — I129 Hypertensive chronic kidney disease with stage 1 through stage 4 chronic kidney disease, or unspecified chronic kidney disease: Secondary | ICD-10-CM | POA: Diagnosis present

## 2019-07-16 DIAGNOSIS — E039 Hypothyroidism, unspecified: Secondary | ICD-10-CM | POA: Diagnosis present

## 2019-07-16 DIAGNOSIS — Z833 Family history of diabetes mellitus: Secondary | ICD-10-CM | POA: Diagnosis not present

## 2019-07-16 DIAGNOSIS — F419 Anxiety disorder, unspecified: Secondary | ICD-10-CM | POA: Diagnosis present

## 2019-07-16 DIAGNOSIS — Z8249 Family history of ischemic heart disease and other diseases of the circulatory system: Secondary | ICD-10-CM | POA: Diagnosis not present

## 2019-07-16 DIAGNOSIS — Z882 Allergy status to sulfonamides status: Secondary | ICD-10-CM | POA: Diagnosis not present

## 2019-07-16 DIAGNOSIS — I82412 Acute embolism and thrombosis of left femoral vein: Secondary | ICD-10-CM | POA: Diagnosis present

## 2019-07-16 DIAGNOSIS — I82432 Acute embolism and thrombosis of left popliteal vein: Secondary | ICD-10-CM | POA: Diagnosis present

## 2019-07-16 DIAGNOSIS — Z888 Allergy status to other drugs, medicaments and biological substances status: Secondary | ICD-10-CM | POA: Diagnosis not present

## 2019-07-16 DIAGNOSIS — Z885 Allergy status to narcotic agent status: Secondary | ICD-10-CM | POA: Diagnosis not present

## 2019-07-16 LAB — HEPARIN LEVEL (UNFRACTIONATED)
Heparin Unfractionated: <0.1 IU/mL — ABNORMAL LOW (ref 0.30–0.70)
Heparin Unfractionated: 0.47 IU/mL (ref 0.30–0.70)

## 2019-07-16 LAB — TROPONIN I (HIGH SENSITIVITY): Troponin I (High Sensitivity): 12 ng/L (ref <18)

## 2019-07-16 LAB — TSH: TSH: 2.729 u[IU]/mL (ref 0.350–4.500)

## 2019-07-16 LAB — SARS CORONAVIRUS 2 (TAT 6-24 HRS): SARS Coronavirus 2: NEGATIVE

## 2019-07-16 LAB — MRSA PCR SCREENING: MRSA by PCR: NEGATIVE

## 2019-07-16 LAB — HEMOGLOBIN A1C
Hgb A1c MFr Bld: 5.8 % — ABNORMAL HIGH (ref 4.8–5.6)
Mean Plasma Glucose: 119.76 mg/dL

## 2019-07-16 MED ORDER — ONDANSETRON HCL 4 MG PO TABS: 4.0000 mg | ORAL_TABLET | Freq: Four times a day (QID) | ORAL | Status: DC | PRN

## 2019-07-16 MED ORDER — SODIUM CHLORIDE 0.9% FLUSH
3.0000 mL | Freq: Two times a day (BID) | INTRAVENOUS | Status: DC
  Administered 2019-07-16 – 2019-07-20 (×8): 3 mL via INTRAVENOUS

## 2019-07-16 MED ORDER — LORAZEPAM 2 MG PO TABS
2.0000 mg | ORAL_TABLET | Freq: Three times a day (TID) | ORAL | Status: DC | PRN
  Administered 2019-07-17 – 2019-07-19 (×4): 2 mg via ORAL
  Filled 2019-07-16 (×4): qty 1

## 2019-07-16 MED ORDER — SERTRALINE HCL 50 MG PO TABS
100.0000 mg | ORAL_TABLET | Freq: Every day | ORAL | Status: DC
  Administered 2019-07-17 – 2019-07-19 (×4): 100 mg via ORAL
  Filled 2019-07-16 (×4): qty 2

## 2019-07-16 MED ORDER — ONDANSETRON HCL 4 MG/2ML IJ SOLN: 4.0000 mg | Freq: Four times a day (QID) | INTRAMUSCULAR | Status: DC | PRN

## 2019-07-16 MED ORDER — ENSURE ENLIVE PO LIQD
237.0000 mL | Freq: Two times a day (BID) | ORAL | Status: DC
  Administered 2019-07-18 (×2): 237 mL via ORAL

## 2019-07-16 MED ORDER — POLYVINYL ALCOHOL 1.4 % OP SOLN
Freq: Two times a day (BID) | OPHTHALMIC | Status: DC
  Administered 2019-07-16 – 2019-07-19 (×7): via OPHTHALMIC
  Administered 2019-07-19: 1 [drp] via OPHTHALMIC
  Administered 2019-07-20: 09:00:00 via OPHTHALMIC
  Filled 2019-07-16: qty 15

## 2019-07-16 MED ORDER — HEPARIN BOLUS VIA INFUSION
3100.0000 [IU] | Freq: Once | INTRAVENOUS | Status: AC
  Administered 2019-07-16: 3100 [IU] via INTRAVENOUS
  Filled 2019-07-16: qty 3100

## 2019-07-16 MED ORDER — ACETAMINOPHEN 650 MG RE SUPP: 650.0000 mg | Freq: Four times a day (QID) | RECTAL | Status: DC | PRN

## 2019-07-16 MED ORDER — ASPIRIN EC 81 MG PO TBEC
81.0000 mg | DELAYED_RELEASE_TABLET | Freq: Every day | ORAL | Status: DC
  Administered 2019-07-16 – 2019-07-20 (×5): 81 mg via ORAL
  Filled 2019-07-16 (×5): qty 1

## 2019-07-16 MED ORDER — VITAMIN D 25 MCG (1000 UNIT) PO TABS
1000.0000 [IU] | ORAL_TABLET | Freq: Every day | ORAL | Status: DC
  Administered 2019-07-16 – 2019-07-20 (×5): 1000 [IU] via ORAL
  Filled 2019-07-16 (×5): qty 1

## 2019-07-16 MED ORDER — CARVEDILOL 12.5 MG PO TABS
12.5000 mg | ORAL_TABLET | Freq: Two times a day (BID) | ORAL | Status: DC
  Administered 2019-07-16 – 2019-07-20 (×8): 12.5 mg via ORAL
  Filled 2019-07-16 (×8): qty 1

## 2019-07-16 MED ORDER — HEPARIN BOLUS VIA INFUSION
1900.0000 [IU] | Freq: Once | INTRAVENOUS | Status: AC
  Administered 2019-07-16: 1900 [IU] via INTRAVENOUS
  Filled 2019-07-16: qty 1900

## 2019-07-16 MED ORDER — POTASSIUM CHLORIDE CRYS ER 20 MEQ PO TBCR
20.0000 meq | EXTENDED_RELEASE_TABLET | Freq: Every morning | ORAL | Status: DC
  Administered 2019-07-16 – 2019-07-20 (×5): 20 meq via ORAL
  Filled 2019-07-16 (×5): qty 1

## 2019-07-16 MED ORDER — SIMETHICONE 80 MG PO CHEW: 80.0000 mg | CHEWABLE_TABLET | Freq: Four times a day (QID) | ORAL | Status: DC | PRN

## 2019-07-16 MED ORDER — PANTOPRAZOLE SODIUM 40 MG PO TBEC
40.0000 mg | DELAYED_RELEASE_TABLET | Freq: Every day | ORAL | Status: DC
  Administered 2019-07-16 – 2019-07-17 (×2): 40 mg via ORAL
  Filled 2019-07-16 (×2): qty 1

## 2019-07-16 MED ORDER — DOCUSATE SODIUM 100 MG PO CAPS
100.0000 mg | ORAL_CAPSULE | Freq: Two times a day (BID) | ORAL | Status: DC
  Administered 2019-07-16 – 2019-07-20 (×9): 100 mg via ORAL
  Filled 2019-07-16 (×9): qty 1

## 2019-07-16 MED ORDER — CYANOCOBALAMIN 1000 MCG/ML IJ SOLN: 1000.0000 ug | INTRAMUSCULAR | Status: DC

## 2019-07-16 MED ORDER — MAGNESIUM OXIDE 400 MG PO TABS: 400.0000 mg | ORAL_TABLET | Freq: Every day | ORAL | Status: DC

## 2019-07-16 MED ORDER — ISOSORBIDE MONONITRATE ER 30 MG PO TB24
30.0000 mg | ORAL_TABLET | Freq: Every day | ORAL | Status: DC
  Administered 2019-07-16 – 2019-07-20 (×5): 30 mg via ORAL
  Filled 2019-07-16 (×6): qty 1

## 2019-07-16 MED ORDER — ACETAMINOPHEN 325 MG PO TABS
650.0000 mg | ORAL_TABLET | Freq: Four times a day (QID) | ORAL | Status: DC | PRN
  Administered 2019-07-16: 650 mg via ORAL
  Filled 2019-07-16: qty 2

## 2019-07-16 MED ORDER — AMLODIPINE BESYLATE 10 MG PO TABS
10.0000 mg | ORAL_TABLET | Freq: Every day | ORAL | Status: DC
  Administered 2019-07-16 – 2019-07-20 (×5): 10 mg via ORAL
  Filled 2019-07-16 (×5): qty 1

## 2019-07-16 MED ORDER — PRAVASTATIN SODIUM 40 MG PO TABS
40.0000 mg | ORAL_TABLET | Freq: Every day | ORAL | Status: DC
  Administered 2019-07-16 – 2019-07-19 (×4): 40 mg via ORAL
  Filled 2019-07-16 (×4): qty 1

## 2019-07-16 MED ORDER — PERFLUTREN LIPID MICROSPHERE
1.0000 mL | INTRAVENOUS | Status: AC | PRN
  Administered 2019-07-16: 2 mL via INTRAVENOUS
  Filled 2019-07-16: qty 10

## 2019-07-16 MED ORDER — LEVOTHYROXINE SODIUM 50 MCG PO TABS
50.0000 ug | ORAL_TABLET | Freq: Every day | ORAL | Status: DC
  Administered 2019-07-16 – 2019-07-20 (×5): 50 ug via ORAL
  Filled 2019-07-16 (×5): qty 1

## 2019-07-16 MED ORDER — HEPARIN (PORCINE) 25000 UT/250ML-% IV SOLN
1200.0000 [IU]/h | INTRAVENOUS | Status: DC
  Administered 2019-07-16: 1200 [IU]/h via INTRAVENOUS
  Administered 2019-07-16: 1000 [IU]/h via INTRAVENOUS
  Administered 2019-07-17: 1200 [IU]/h via INTRAVENOUS
  Filled 2019-07-16 (×3): qty 250

## 2019-07-16 NOTE — Progress Notes (Signed)
*  PRELIMINARY RESULTS* Echocardiogram 2D Echocardiogram has been performed. Definity IV Contrast used on this study. Jerauld 07/16/2019, 6:07 PM

## 2019-07-16 NOTE — Consult Note (Signed)
[facility] [place]  Patient name: Erin Sutton MRN: [id number] DOB: [birth year] Sex: female   REQUESTING PROVIDER:   Hospital service   REASON FOR CONSULT:    Pulmonary embolism  HISTORY OF PRESENT ILLNESS:   Erin Sutton is a 78 y.o. female, who presented to the emergency department on 07/16/2019 with complaints of chest pain and shortness of breath.  In the emergency department she was mildly tachypneic but not tachycardic or hypoxic her d-dimer was elevated which led to a CT scan that showed moderately large bilateral PEs with evidence of heart strain.  She was started on heparin drip in the emergency department and admitted.  The patient states that she has chronic shortness of breath.  She reports being very inactive recently and has been at [employer].  She was recently admitted for intractable nausea and abdominal pain.  She is medically managed for hypertension.  She has chronic renal insufficiency and sleep apnea.  She is a non-smoker  PAST MEDICAL HISTORY    Past Medical History:  Diagnosis Date  . Anxiety   . Arthritis   . Chronic kidney disease   . Constipation   . GERD (gastroesophageal reflux disease)   . Hypertension   . Hypothyroidism   . Osteoporosis   . Sleep apnea   . Urinary incontinence      FAMILY HISTORY   Family History  Problem Relation Age of Onset  . Hypertension Mother   . Diabetes Mother   . Heart attack Mother   . Asthma Mother   . Peripheral vascular disease Mother   . Heart attack Father   . Hypertension Father   . Diabetes Father     SOCIAL HISTORY:   Social History   Socioeconomic History  . Marital status: Widowed    Spouse name: Not on file  . Number of children: Not on file  . Years of education: Not on file  . Highest education level: Not on file  Occupational History  . Not on file  Social Needs  . Financial resource strain: Not hard at all  .  Food insecurity    Worry: Patient refused    Inability: Patient refused  . Transportation needs    Medical: Patient refused    Non-medical: Patient refused  Tobacco Use  . Smoking status: Never Smoker  . Smokeless tobacco: Never Used  Substance and Sexual Activity  . Alcohol use: No  . Drug use: No  . Sexual activity: Not Currently  Lifestyle  . Physical activity    Days per week: Patient refused    Minutes per session: Patient refused  . Stress: Only a little  Relationships  . Social Herbalist on phone: Patient refused    Gets together: Patient refused    Attends religious service: Patient refused    Active member of club or organization: Patient refused    Attends meetings of clubs or organizations: Patient refused    Relationship status: Patient refused  . Intimate partner violence    Fear of current or ex partner: Patient refused    Emotionally abused: Patient refused    Physically abused: Patient refused    Forced sexual activity: Patient refused  Other Topics Concern  . Not on file  Social History Narrative   Patient lives at home by herself.  Ambulates with a cane.   Has a daughter who lives in [place]:    Allergies  Allergen Reactions  . Ace Inhibitors Cough  . Amoxicillin Rash    Has patient had a PCN reaction causing immediate rash, facial/tongue/throat swelling, SOB or lightheadedness with hypotension: No Has patient had a PCN reaction causing severe rash involving mucus membranes or skin necrosis: No Has patient had a PCN reaction that required hospitalization: No Has patient had a PCN reaction occurring within the last 10 years: No If all of the above answers are "NO", then may proceed with Cephalosporin use.   Marland Kitchen Benicar [Olmesartan] Rash    Itching rash  . Codeine Sulfate Rash  . Levaquin [Levofloxacin In D5w] Rash    Alters mental status  . Penicillin V Potassium Rash    Has patient had a PCN reaction causing  immediate rash, facial/tongue/throat swelling, SOB or lightheadedness with hypotension: No Has patient had a PCN reaction causing severe rash involving mucus membranes or skin necrosis: No Has patient had a PCN reaction that required hospitalization: No Has patient had a PCN reaction occurring within the last 10 years: No If all of the above answers are "NO", then may proceed with Cephalosporin use.   . Sulfa Antibiotics Rash    CURRENT MEDICATIONS:    Current Facility-Administered Medications  Medication Dose Route Frequency Provider Last Rate Last Dose  . acetaminophen (TYLENOL) tablet 650 mg  650 mg Oral Q6H PRN Harrie Foreman, MD       Or  . acetaminophen (TYLENOL) suppository 650 mg  650 mg Rectal Q6H PRN Harrie Foreman, MD      . amLODipine (NORVASC) tablet 10 mg  10 mg Oral Daily Harrie Foreman, MD   10 mg at 07/16/19 1016  . aspirin EC tablet 81 mg  81 mg Oral Daily Harrie Foreman, MD   81 mg at 07/16/19 1016  . carvedilol (COREG) tablet 12.5 mg  12.5 mg Oral BID WC Harrie Foreman, MD   12.5 mg at 07/16/19 1724  . cholecalciferol (VITAMIN D3) tablet 1,000 Units  1,000 Units Oral Daily Harrie Foreman, MD   1,000 Units at 07/16/19 1016  . [START ON 08/04/2019] cyanocobalamin ((VITAMIN B-12)) injection 1,000 mcg  1,000 mcg Intramuscular Q30 days Harrie Foreman, MD      . docusate sodium (COLACE) capsule 100 mg  100 mg Oral BID Harrie Foreman, MD   100 mg at 07/16/19 1017  . feeding supplement (ENSURE ENLIVE) (ENSURE ENLIVE) liquid 237 mL  237 mL Oral BID BM Harrie Foreman, MD      . heparin ADULT infusion 100 units/mL (25000 units/238mL sodium chloride 0.45%)  1,200 Units/hr Intravenous Continuous Harrie Foreman, MD 12 mL/hr at 07/16/19 1451 1,200 Units/hr at 07/16/19 1451  . isosorbide mononitrate (IMDUR) 24 hr tablet 30 mg  30 mg Oral Daily Harrie Foreman, MD   30 mg at 07/16/19 1021  . levothyroxine (SYNTHROID) tablet 50 mcg  50 mcg Oral  QAC breakfast Harrie Foreman, MD   50 mcg at 07/16/19 1021  . LORazepam (ATIVAN) tablet 2 mg  2 mg Oral Q8H PRN Harrie Foreman, MD      . ondansetron [facility]) tablet 4 mg  4 mg Oral Q6H PRN Harrie Foreman, MD       Or  . ondansetron [facility]) injection 4 mg  4 mg Intravenous Q6H PRN Harrie Foreman, MD      . pantoprazole (PROTONIX) EC tablet 40 mg  40 mg Oral Daily Harrie Foreman, MD  40 mg at 07/16/19 1017  . polyvinyl alcohol (LIQUIFILM TEARS) 1.4 % ophthalmic solution   Both Eyes BID Harrie Foreman, MD      . potassium chloride SA (K-DUR) CR tablet 20 mEq  20 mEq Oral q morning - 10a Harrie Foreman, MD   20 mEq at 07/16/19 1016  . pravastatin (PRAVACHOL) tablet 40 mg  40 mg Oral QHS Harrie Foreman, MD      . sertraline (ZOLOFT) tablet 100 mg  100 mg Oral QHS Harrie Foreman, MD        REVIEW OF SYSTEMS:   [X]  denotes positive finding, [ ]  denotes negative finding Cardiac  Comments:  Chest pain or chest pressure: x   Shortness of breath upon exertion:    Short of breath when lying flat:    Irregular heart rhythm:        Vascular    Pain in calf, thigh, or hip brought on by ambulation:    Pain in feet at night that wakes you up from your sleep:     Blood clot in your veins:    Leg swelling:         Pulmonary    Oxygen at home:    Productive cough:     Wheezing:         Neurologic    Sudden weakness in arms or legs:     Sudden numbness in arms or legs:     Sudden onset of difficulty speaking or slurred speech:    Temporary loss of vision in one eye:     Problems with dizziness:         Gastrointestinal    Blood in stool:      Vomited blood:         Genitourinary    Burning when urinating:     Blood in urine:        Psychiatric    Major depression:         Hematologic    Bleeding problems:    Problems with blood clotting too easily:        Skin    Rashes or ulcers:        Constitutional    Fever or chills:     PHYSICAL  EXAM:   Vitals:   07/16/19 0400 07/16/19 0435 07/16/19 0816 07/16/19 1632  BP:  (!) 155/68 (!) 151/90 114/61  Pulse: 79 85 85 75  Resp: (!) 22 20 16    Temp:  98.6 F (37 C) 98.8 F (37.1 C) 99.1 F (37.3 C)  TempSrc:  Oral Oral Oral  SpO2: 100% 100% 96% 98%  Weight:  77.2 kg    Height:        GENERAL: The patient is a well-nourished female, in no acute distress. The vital signs are documented above. CARDIAC: There is a regular rate and rhythm.  VASCULAR: No calf tenderness PULMONARY: Nonlabored respirations ABDOMEN: Soft and non-tender with normal pitched bowel sounds.  MUSCULOSKELETAL: There are no major deformities or cyanosis. NEUROLOGIC: No focal weakness or paresthesias are detected. SKIN: There are no ulcers or rashes noted. PSYCHIATRIC: The patient has a normal affect.  STUDIES:   I have reviewed the CT scan of the chest with the following findings: 1. Positive for acute pulmonary embolism with CT evidence of right heart strain. Filling defects in bilateral main pulmonary arteries extending into lobar and multiple segmental branches, greater on the right. Thromboembolic burden is moderate to large. 2. Right  lower lobe pulmonary infarct. Small right pleural effusion with adjacent compressive atelectasis. 3. Aortic Atherosclerosis (ICD10-I70.0). Coronary artery Calcifications.   Lower extremity venous ultrasound: Positive exam for left femoropopliteal DVT extending into the calf veins.  Negative for right lower extremity DVT.  Cardiac echo is pending  ASSESSMENT and PLAN   Bilateral PE: CT scan shows moderately large bilateral pulmonary emboli with right heart strain.  Echo is pending.  Currently the patient is breathing comfortably on 2 L nasal cannula.  She does not complain of shortness of breath and her chest pain has nearly resolved.  She is not tachypneic.  I do not see any acute indications for pulmonary thrombectomy.  I would recommend continuing  heparin drip with conversion to oral anticoagulation.  I would encourage ambulation.  If she has significant difficulties with shortness of breath and tachypnea with ambulation, pulmonary thrombectomy could be considered.   Leia Alf, MD, FACS [facility] [phone] Pager [phone]

## 2019-07-16 NOTE — Progress Notes (Addendum)
Saw the patient bedside.  Vital signs and labs are reviewed.  Physical examination done. This is a 78 year old female admitted for pulmonary emboli. 1.  Pulmonary emboli: Continue heparin drip and aspirin.  Venous duplex report left leg DVT.  Follow-up vascular surgery consult. Acute respiratory failure with hypoxia due to above.  Try to wean off oxygen. 2.  Hypertension: Uncontrolled.  Resumed Imdur, amlodipine and carvedilol. 3.  Hypothyroidism: continue Synthroid 4.  Obesity: BMI is 33.2; encourage healthy diet and exercise.  I discussed with patient, her daughter and nurse.

## 2019-07-16 NOTE — ED Provider Notes (Signed)
-----------------------------------------   2:06 AM on 07/16/2019 -----------------------------------------  Patient requiring 2 L of oxygen to maintain sats in the upper 90s.  Patient CTA is resulted showing fairly large bilateral PEs with right heart strain.  Given these findings we will admit the patient to the hospitalist service.  I have ordered a heparin drip for the patient.  CRITICAL CARE Performed by: Harvest Dark   Total critical care time: 30 minutes  Critical care time was exclusive of separately billable procedures and treating other patients.  Critical care was necessary to treat or prevent imminent or life-threatening deterioration.  Critical care was time spent personally by me on the following activities: development of treatment plan with patient and/or surrogate as well as nursing, discussions with consultants, evaluation of patient's response to treatment, examination of patient, obtaining history from patient or surrogate, ordering and performing treatments and interventions, ordering and review of laboratory studies, ordering and review of radiographic studies, pulse oximetry and re-evaluation of patient's condition.    Harvest Dark, MD 07/16/19 [phone]

## 2019-07-16 NOTE — Progress Notes (Signed)
ANTICOAGULATION CONSULT NOTE -  Pharmacy Consult for Heparin Indication: pulmonary embolus  Allergies  Allergen Reactions  . Ace Inhibitors Cough  . Amoxicillin Rash    Has patient had a PCN reaction causing immediate rash, facial/tongue/throat swelling, SOB or lightheadedness with hypotension: No Has patient had a PCN reaction causing severe rash involving mucus membranes or skin necrosis: No Has patient had a PCN reaction that required hospitalization: No Has patient had a PCN reaction occurring within the last 10 years: No If all of the above answers are "NO", then may proceed with Cephalosporin use.   Marland Kitchen Benicar [Olmesartan] Rash    Itching rash  . Codeine Sulfate Rash  . Levaquin [Levofloxacin In D5w] Rash    Alters mental status  . Penicillin V Potassium Rash    Has patient had a PCN reaction causing immediate rash, facial/tongue/throat swelling, SOB or lightheadedness with hypotension: No Has patient had a PCN reaction causing severe rash involving mucus membranes or skin necrosis: No Has patient had a PCN reaction that required hospitalization: No Has patient had a PCN reaction occurring within the last 10 years: No If all of the above answers are "NO", then may proceed with Cephalosporin use.   . Sulfa Antibiotics Rash   Patient Measurements: Height: 5' (152.4 cm) Weight: 170 lb 1.6 oz (77.2 kg) IBW/kg (Calculated) : 45.5 HEPARIN DW (KG): 62.5  Vital Signs: Temp: 99.8 F (37.7 C) (08/22 1931) Temp Source: Oral (08/22 1931) BP: 125/59 (08/22 1931) Pulse Rate: 78 (08/22 1931)  Labs: Recent Labs    07/15/19 2021 07/16/19 0525 07/16/19 1307 07/16/19 2256  HGB 10.9*  --   --   --   HCT 34.4*  --   --   --   PLT 261  --   --   --   APTT 49*  --   --   --   LABPROT 15.8*  --   --   --   INR 1.3*  --   --   --   HEPARINUNFRC  --   --  <0.10* 0.47  CREATININE 0.77  --   --   --   TROPONINIHS 10 12  --   --    Estimated Creatinine Clearance: 53.2 mL/min (by  C-G formula based on SCr of 0.77 mg/dL).  Medical History: Past Medical History:  Diagnosis Date  . Anxiety   . Arthritis   . Chronic kidney disease   . Constipation   . GERD (gastroesophageal reflux disease)   . Hypertension   . Hypothyroidism   . Osteoporosis   . Sleep apnea   . Urinary incontinence    Medications:  Medications Prior to Admission  Medication Sig Dispense Refill Last Dose  . amLODipine (NORVASC) 10 MG tablet Take 10 mg by mouth daily.   07/15/2019 at 0700  . aspirin EC 81 MG tablet Take 81 mg by mouth daily.   07/15/2019 at 0800  . carvedilol (COREG) 12.5 MG tablet Take 12.5 mg by mouth 2 (two) times daily.   07/15/2019 at 1830  . cholecalciferol (VITAMIN D3) 25 MCG (1000 UT) tablet Take 1,000 Units by mouth daily.   07/15/2019 at 0800  . cyanocobalamin (,VITAMIN B-12,) 1000 MCG/ML injection Inject 1,000 mcg into the muscle every 30 (thirty) days.   07/04/2019 at Unknown time  . docusate sodium (COLACE) 100 MG capsule Take 1 capsule (100 mg total) by mouth 2 (two) times daily. 60 capsule 2 Past Month at Unknown time  . isosorbide  mononitrate (IMDUR) 30 MG 24 hr tablet Take 30 mg by mouth daily.   07/15/2019 at 1830  . levothyroxine (SYNTHROID, LEVOTHROID) 50 MCG tablet Take 50 mcg by mouth daily before breakfast.   07/15/2019 at 0600  . LORazepam (ATIVAN) 2 MG tablet Take 2 mg by mouth every 8 (eight) hours as needed for anxiety.    prn at prn  . meloxicam (MOBIC) 15 MG tablet Take 15 mg by mouth daily as needed.   prn at prn  . ondansetron (ZOFRAN) 8 MG tablet Take 8 mg by mouth every 8 (eight) hours as needed.   prn at prn  . pantoprazole (PROTONIX) 40 MG tablet Take 40 mg by mouth daily.   07/15/2019 at 0700  . Polyethyl Glycol-Propyl Glycol (SYSTANE OP) Place 1 drop into both eyes 2 (two) times daily.   07/15/2019 at 1830  . potassium chloride SA (K-DUR,KLOR-CON) 20 MEQ tablet Take 20 mEq by mouth every morning.   07/15/2019 at 1100  . pravastatin (PRAVACHOL) 40 MG tablet  Take 40 mg by mouth at bedtime.    07/15/2019 at 2230  . sertraline (ZOLOFT) 100 MG tablet Take 100 mg by mouth at bedtime.    07/15/2019 at 2230  . feeding supplement, ENSURE ENLIVE, (ENSURE ENLIVE) LIQD Take 237 mLs by mouth 2 (two) times daily between meals. 237 mL 12   . magnesium oxide (MAG-OX) 400 MG tablet Take 400 mg by mouth daily.   Not Taking at Unknown time  . simethicone (MYLICON) 80 MG chewable tablet Chew 1 tablet (80 mg total) by mouth every 6 (six) hours as needed for flatulence. (Patient not taking: Reported on 07/16/2019) 30 tablet 0 Not Taking at Unknown time   Assessment: Erin Sutton is a 78 y.o. female who reports onset of shortness of breath and some pleuritic chest pain in the right side of the chest this morning.  This is all gone now although she is little but she still short of breath.  Her d-dimer was 2000.  She was sent here for further evaluation.  Baseline coag labs are elevated, aPTT 49, INR 1.3, PT 15.8.  H/H low but PLTs OK.  PTA med list does not indicate any use of anticoagulants.  8/22 ~ 13:00 HL < 0.10 0822 @ 2256 HL = 0.47, therapeutic x 1  Goal of Therapy:  Heparin level 0.3-0.7 units/ml Monitor platelets by anticoagulation protocol: Yes   Plan:  Continue Heparin at current rate (1200 units/hr) Check HL in 8 hours to confirm, check CBC daily  Monitor HL, CBC, s/sx bleeding complications per protocol  Ena Dawley, RPH 07/16/2019,11:46 PM

## 2019-07-16 NOTE — H&P (Signed)
Erin Sutton is an 78 y.o. female.   Chief Complaint: Shortness of breath HPI: The patient with past medical history of hypertension, hypothyroidism and sleep apnea presents to the emergency department complaining of shortness of breath.  The patient's dyspnea was accompanied by pleuritic chest pain that began earlier today.  Upon arrival to the emergency department the patient was mildly tachypneic but had no hypoxia or tachycardia.  However, she did have atypical chest pain and a elevated d-dimer which prompted CTA of the chest that revealed large bilateral PEs with evidence of right heart strain.  The patient was started on heparin drip prior to the emergency department staff calling the hospitalist service for admission.  Past Medical History:  Diagnosis Date  . Anxiety   . Arthritis   . Chronic kidney disease   . Constipation   . GERD (gastroesophageal reflux disease)   . Hypertension   . Hypothyroidism   . Osteoporosis   . Sleep apnea   . Urinary incontinence     Past Surgical History:  Procedure Laterality Date  . BREAST BIOPSY Right    neg/stereo  . BREAST BIOPSY Left    neg/stereo  . BREAST EXCISIONAL BIOPSY Right    neg  . COLONOSCOPY WITH PROPOFOL N/A 07/02/2015   Procedure: COLONOSCOPY WITH PROPOFOL;  Surgeon: Manya Silvas, MD;  Location: [facility] ENDOSCOPY;  Service: Endoscopy;  Laterality: N/A;  . CORRECTION HAMMER TOE Right   . ESOPHAGOGASTRODUODENOSCOPY (EGD) WITH PROPOFOL N/A 07/02/2015   Procedure: ESOPHAGOGASTRODUODENOSCOPY (EGD) WITH PROPOFOL;  Surgeon: Manya Silvas, MD;  Location: [facility] ENDOSCOPY;  Service: Endoscopy;  Laterality: N/A;  . JOINT REPLACEMENT Left    knee  . REPLACEMENT TOTAL KNEE Left   . SKIN BIOPSY Right     Family History  Problem Relation Age of Onset  . Hypertension Mother   . Diabetes Mother   . Heart attack Mother   . Asthma Mother   . Peripheral vascular disease Mother   . Heart attack Father   . Hypertension Father   .  Diabetes Father    Social History:  reports that she has never smoked. She has never used smokeless tobacco. She reports that she does not drink alcohol or use drugs.  Allergies:  Allergies  Allergen Reactions  . Ace Inhibitors Cough  . Amoxicillin Rash    Has patient had a PCN reaction causing immediate rash, facial/tongue/throat swelling, SOB or lightheadedness with hypotension: No Has patient had a PCN reaction causing severe rash involving mucus membranes or skin necrosis: No Has patient had a PCN reaction that required hospitalization: No Has patient had a PCN reaction occurring within the last 10 years: No If all of the above answers are "NO", then may proceed with Cephalosporin use.   Marland Kitchen Benicar [Olmesartan] Rash    Itching rash  . Codeine Sulfate Rash  . Levaquin [Levofloxacin In D5w] Rash    Alters mental status  . Penicillin V Potassium Rash    Has patient had a PCN reaction causing immediate rash, facial/tongue/throat swelling, SOB or lightheadedness with hypotension: No Has patient had a PCN reaction causing severe rash involving mucus membranes or skin necrosis: No Has patient had a PCN reaction that required hospitalization: No Has patient had a PCN reaction occurring within the last 10 years: No If all of the above answers are "NO", then may proceed with Cephalosporin use.   . Sulfa Antibiotics Rash    Medications Prior to Admission  Medication Sig Dispense Refill  .  amLODipine (NORVASC) 10 MG tablet Take 10 mg by mouth daily.    Marland Kitchen aspirin EC 81 MG tablet Take 81 mg by mouth daily.    . carvedilol (COREG) 12.5 MG tablet Take 12.5 mg by mouth 2 (two) times daily.    . cholecalciferol (VITAMIN D3) 25 MCG (1000 UT) tablet Take 1,000 Units by mouth daily.    . cyanocobalamin (,VITAMIN B-12,) 1000 MCG/ML injection Inject 1,000 mcg into the muscle every 30 (thirty) days.    Marland Kitchen docusate sodium (COLACE) 100 MG capsule Take 1 capsule (100 mg total) by mouth 2 (two) times  daily. 60 capsule 2  . isosorbide mononitrate (IMDUR) 30 MG 24 hr tablet Take 30 mg by mouth daily.    Marland Kitchen levothyroxine (SYNTHROID, LEVOTHROID) 50 MCG tablet Take 50 mcg by mouth daily before breakfast.    . LORazepam (ATIVAN) 2 MG tablet Take 2 mg by mouth every 8 (eight) hours as needed for anxiety.     . meloxicam (MOBIC) 15 MG tablet Take 15 mg by mouth daily as needed.    . ondansetron (ZOFRAN) 8 MG tablet Take 8 mg by mouth every 8 (eight) hours as needed.    . pantoprazole (PROTONIX) 40 MG tablet Take 40 mg by mouth daily.    Vladimir Faster Glycol-Propyl Glycol (SYSTANE OP) Place 1 drop into both eyes 2 (two) times daily.    . potassium chloride SA (K-DUR,KLOR-CON) 20 MEQ tablet Take 20 mEq by mouth every morning.    . pravastatin (PRAVACHOL) 40 MG tablet Take 40 mg by mouth at bedtime.     . sertraline (ZOLOFT) 100 MG tablet Take 100 mg by mouth at bedtime.     . feeding supplement, ENSURE ENLIVE, (ENSURE ENLIVE) LIQD Take 237 mLs by mouth 2 (two) times daily between meals. 237 mL 12  . magnesium oxide (MAG-OX) 400 MG tablet Take 400 mg by mouth daily.    . simethicone (MYLICON) 80 MG chewable tablet Chew 1 tablet (80 mg total) by mouth every 6 (six) hours as needed for flatulence. (Patient not taking: Reported on 07/16/2019) 30 tablet 0    Results for orders placed or performed during the hospital encounter of 07/15/19 (from the past 48 hour(s))  Comprehensive metabolic panel     Status: Abnormal   Collection Time: 07/15/19  8:21 PM  Result Value Ref Range   Sodium 135 135 - 145 mmol/L   Potassium 3.9 3.5 - 5.1 mmol/L   Chloride 99 98 - 111 mmol/L   CO2 26 22 - 32 mmol/L   Glucose, Bld 121 (H) 70 - 99 mg/dL   BUN 11 8 - 23 mg/dL   Creatinine, Ser 0.77 0.44 - 1.00 mg/dL   Calcium 9.3 8.9 - 10.3 mg/dL   Total Protein 6.6 6.5 - 8.1 g/dL   Albumin 3.0 (L) 3.5 - 5.0 g/dL   AST 33 15 - 41 U/L   ALT 13 0 - 44 U/L   Alkaline Phosphatase 53 38 - 126 U/L   Total Bilirubin 0.7 0.3 - 1.2  mg/dL   GFR calc non Af Amer >60 >60 mL/min   GFR calc Af Amer >60 >60 mL/min   Anion gap 10 5 - 15    Comment: Performed at [facility], [address] [place], [place] [address]  Troponin I (High Sensitivity)     Status: None   Collection Time: 07/15/19  8:21 PM  Result Value Ref Range   Troponin I (High Sensitivity) 10 <18  ng/L    Comment: (NOTE) Elevated high sensitivity troponin I (hsTnI) values and significant  changes across serial measurements may suggest ACS but many other  chronic and acute conditions are known to elevate hsTnI results.  Refer to the "Links" section for chest pain algorithms and additional  guidance. Performed at [facility], [address] [place], [place] [address]   CBC with Differential     Status: Abnormal   Collection Time: 07/15/19  8:21 PM  Result Value Ref Range   WBC 10.2 4.0 - 10.5 K/uL   RBC 3.90 3.87 - 5.11 MIL/uL   Hemoglobin 10.9 (L) 12.0 - 15.0 g/dL   HCT 34.4 (L) 36.0 - 46.0 %   MCV 88.2 80.0 - 100.0 fL   MCH 27.9 26.0 - 34.0 pg   MCHC 31.7 30.0 - 36.0 g/dL   RDW 17.2 (H) 11.5 - 15.5 %   Platelets 261 150 - 400 K/uL   nRBC 0.0 0.0 - 0.2 %   Neutrophils Relative % 77 %   Neutro Abs 7.9 (H) 1.7 - 7.7 K/uL   Lymphocytes Relative 10 %   Lymphs Abs 1.0 0.7 - 4.0 K/uL   Monocytes Relative 11 %   Monocytes Absolute 1.1 (H) 0.1 - 1.0 K/uL   Eosinophils Relative 1 %   Eosinophils Absolute 0.1 0.0 - 0.5 K/uL   Basophils Relative 0 %   Basophils Absolute 0.0 0.0 - 0.1 K/uL   Immature Granulocytes 1 %   Abs Immature Granulocytes 0.08 (H) 0.00 - 0.07 K/uL    Comment: Performed at [facility], [address] [place], [place] [address]  Protime-INR     Status: Abnormal   Collection Time: 07/15/19  8:21 PM  Result Value Ref Range   Prothrombin Time 15.8 (H) 11.4 - 15.2 seconds   INR 1.3 (H) 0.8 - 1.2    Comment: (NOTE) INR goal varies based on device and disease states. Performed at [facility], [address] [place], [place] [address]   APTT     Status: Abnormal   Collection Time: 07/15/19  8:21 PM  Result Value Ref Range   aPTT 49 (H) 24 - 36 seconds    Comment:        IF BASELINE aPTT IS ELEVATED, SUGGEST PATIENT RISK ASSESSMENT BE USED TO DETERMINE APPROPRIATE ANTICOAGULANT THERAPY. Performed at [facility], [place], [place] [address]    Ct Angio Chest Pe W And/or Wo Contrast  Result Date: 07/16/2019 CLINICAL DATA:  Shortness of breath and pleuritic chest pain. EXAM: CT ANGIOGRAPHY CHEST WITH CONTRAST TECHNIQUE: Multidetector CT imaging of the chest was performed using the standard protocol during bolus administration of intravenous contrast. Multiplanar CT image reconstructions and MIPs were obtained to evaluate the vascular anatomy. CONTRAST:  29mL OMNIPAQUE IOHEXOL 350 MG/ML SOLN COMPARISON:  Radiograph yesterday. FINDINGS: Cardiovascular: Examination is positive for acute pulmonary embolism with filling defects in the bilateral main pulmonary arteries extending into lobar and segmental branches. Thromboembolic burden is greater on the right and in the lower lobe. Mild straightening of the intraventricular septum with RV to LV ratio of 1. Thoracic aortic atherosclerosis without dissection. Conventional branching pattern from the aortic arch. Coronary artery calcifications. Mediastinum/Nodes: No enlarged mediastinal or hilar lymph nodes. Thyroid gland not well visualized. Esophagus is decompressed. Lungs/Pleura: Peripheral ground-glass opacity in the right lower lobe consistent with pulmonary infarct. Small right pleural effusion with adjacent compressive atelectasis. Scattered linear atelectasis in the left lung.  No pulmonary edema. Upper Abdomen: No acute findings. Musculoskeletal: There are no acute or suspicious osseous abnormalities. Multilevel degenerative change throughout the spine. Degenerative change of both shoulders. Review of the MIP  images confirms the above findings. IMPRESSION: 1. Positive for acute pulmonary embolism with CT evidence of right heart strain. Filling defects in bilateral main pulmonary arteries extending into lobar and multiple segmental branches, greater on the right. Thromboembolic burden is moderate to large. 2. Right lower lobe pulmonary infarct. Small right pleural effusion with adjacent compressive atelectasis. 3. Aortic Atherosclerosis (ICD10-I70.0). Coronary artery calcifications. Critical Value/emergent results were called by telephone at the time of interpretation on 07/16/2019 at 1:08 am to Dr. Harvest Dark, who verbally acknowledged these results. Electronically Signed   By: Keith Rake M.D.   On: 07/16/2019 01:09   Dg Chest Portable 1 View  Result Date: 07/15/2019 CLINICAL DATA:  Shortness of breath. EXAM: PORTABLE CHEST 1 VIEW COMPARISON:  Radiograph 09/02/2018 FINDINGS: Chronic elevation of right hemidiaphragm. Unchanged heart size and mediastinal contours. Aortic atherosclerosis. Streaky bibasilar opacities without confluent airspace disease. No pleural effusion or pneumothorax. Degenerative change of the left shoulder. IMPRESSION: Streaky bibasilar opacities. Aortic Atherosclerosis (ICD10-I70.0). Electronically Signed   By: Keith Rake M.D.   On: 07/15/2019 20:43    Review of Systems  Constitutional: Negative for chills and fever.  HENT: Negative for sore throat and tinnitus.   Eyes: Negative for blurred vision and redness.  Respiratory: Negative for cough and shortness of breath.   Cardiovascular: Negative for chest pain, palpitations, orthopnea and PND.  Gastrointestinal: Negative for abdominal pain, diarrhea, nausea and vomiting.  Genitourinary: Negative for dysuria, frequency and urgency.  Musculoskeletal: Negative for joint pain and myalgias.  Skin: Negative for rash.       No lesions  Neurological: Negative for speech change, focal weakness and weakness.   Endo/Heme/Allergies: Does not bruise/bleed easily.       No temperature intolerance  Psychiatric/Behavioral: Negative for depression and suicidal ideas.    Blood pressure (!) 155/68, pulse 85, temperature 98.6 F (37 C), temperature source Oral, resp. rate 20, height 5' (1.524 m), weight 77.2 kg, SpO2 100 %. Physical Exam  Vitals reviewed. Constitutional: She is oriented to person, place, and time. She appears well-developed and well-nourished. No distress.  HENT:  Head: Normocephalic and atraumatic.  Mouth/Throat: Oropharynx is clear and moist.  Eyes: Pupils are equal, round, and reactive to light. Conjunctivae and EOM are normal. No scleral icterus.  Neck: Normal range of motion. Neck supple. No JVD present. No tracheal deviation present. No thyromegaly present.  Cardiovascular: Normal rate, regular rhythm and normal heart sounds. Exam reveals no gallop and no friction rub.  No murmur heard. Respiratory: Effort normal and breath sounds normal.  GI: Soft. Bowel sounds are normal. She exhibits no distension. There is no abdominal tenderness.  Genitourinary:    Genitourinary Comments: Deferred   Musculoskeletal: Normal range of motion.        General: No edema.  Lymphadenopathy:    She has no cervical adenopathy.  Neurological: She is alert and oriented to person, place, and time. No cranial nerve deficit. She exhibits normal muscle tone.  Skin: Skin is warm and dry. No rash noted. No erythema.  Psychiatric: She has a normal mood and affect. Her behavior is normal. Judgment and thought content normal.     Assessment/Plan This is a 78 year old female admitted for pulmonary emboli. 1.  Pulmonary emboli: Continue heparin drip.  Appears to be unprovoked.  First event.  Obtain echocardiogram.  Consult vascular surgery if need for embolectomy.  Transition to oral anticoagulation.  Continue aspirin. 2.  Hypertension: Uncontrolled.  Resume Imdur, amlodipine and carvedilol. 3.   Hypothyroidism: Check TSH; continue Synthroid 4.  Obesity: BMI is 33.2; encourage healthy diet and exercise 5.  DVT prophylaxis: Therapeutic anticoagulation 6.  GI prophylaxis: Pantoprazole per home regimen The patient is a full code.  Time spent on admission orders and patient care approximately 45 minutes  Harrie Foreman, MD 07/16/2019, 5:06 AM

## 2019-07-16 NOTE — Progress Notes (Signed)
ANTICOAGULATION CONSULT NOTE -  Pharmacy Consult for Heparin Indication: pulmonary embolus  Allergies  Allergen Reactions  . Ace Inhibitors Cough  . Amoxicillin Rash    Has patient had a PCN reaction causing immediate rash, facial/tongue/throat swelling, SOB or lightheadedness with hypotension: No Has patient had a PCN reaction causing severe rash involving mucus membranes or skin necrosis: No Has patient had a PCN reaction that required hospitalization: No Has patient had a PCN reaction occurring within the last 10 years: No If all of the above answers are "NO", then may proceed with Cephalosporin use.   Marland Kitchen Benicar [Olmesartan] Rash    Itching rash  . Codeine Sulfate Rash  . Levaquin [Levofloxacin In D5w] Rash    Alters mental status  . Penicillin V Potassium Rash    Has patient had a PCN reaction causing immediate rash, facial/tongue/throat swelling, SOB or lightheadedness with hypotension: No Has patient had a PCN reaction causing severe rash involving mucus membranes or skin necrosis: No Has patient had a PCN reaction that required hospitalization: No Has patient had a PCN reaction occurring within the last 10 years: No If all of the above answers are "NO", then may proceed with Cephalosporin use.   . Sulfa Antibiotics Rash   Patient Measurements: Height: 5' (152.4 cm) Weight: 170 lb 1.6 oz (77.2 kg) IBW/kg (Calculated) : 45.5 HEPARIN DW (KG): 62.5  Vital Signs: Temp: 98.8 F (37.1 C) (08/22 0816) Temp Source: Oral (08/22 0816) BP: 151/90 (08/22 0816) Pulse Rate: 85 (08/22 0816)  Labs: Recent Labs    07/15/19 2021 07/16/19 0525 07/16/19 1307  HGB 10.9*  --   --   HCT 34.4*  --   --   PLT 261  --   --   APTT 49*  --   --   LABPROT 15.8*  --   --   INR 1.3*  --   --   HEPARINUNFRC  --   --  <0.10*  CREATININE 0.77  --   --   TROPONINIHS 10 12  --    Estimated Creatinine Clearance: 53.2 mL/min (by C-G formula based on SCr of 0.77 mg/dL).  Medical  History: Past Medical History:  Diagnosis Date  . Anxiety   . Arthritis   . Chronic kidney disease   . Constipation   . GERD (gastroesophageal reflux disease)   . Hypertension   . Hypothyroidism   . Osteoporosis   . Sleep apnea   . Urinary incontinence    Medications:  Medications Prior to Admission  Medication Sig Dispense Refill Last Dose  . amLODipine (NORVASC) 10 MG tablet Take 10 mg by mouth daily.   07/15/2019 at 0700  . aspirin EC 81 MG tablet Take 81 mg by mouth daily.   07/15/2019 at 0800  . carvedilol (COREG) 12.5 MG tablet Take 12.5 mg by mouth 2 (two) times daily.   07/15/2019 at 1830  . cholecalciferol (VITAMIN D3) 25 MCG (1000 UT) tablet Take 1,000 Units by mouth daily.   07/15/2019 at 0800  . cyanocobalamin (,VITAMIN B-12,) 1000 MCG/ML injection Inject 1,000 mcg into the muscle every 30 (thirty) days.   07/04/2019 at Unknown time  . docusate sodium (COLACE) 100 MG capsule Take 1 capsule (100 mg total) by mouth 2 (two) times daily. 60 capsule 2 Past Month at Unknown time  . isosorbide mononitrate (IMDUR) 30 MG 24 hr tablet Take 30 mg by mouth daily.   07/15/2019 at 1830  . levothyroxine (SYNTHROID, LEVOTHROID) 50 MCG tablet Take  50 mcg by mouth daily before breakfast.   07/15/2019 at 0600  . LORazepam (ATIVAN) 2 MG tablet Take 2 mg by mouth every 8 (eight) hours as needed for anxiety.    prn at prn  . meloxicam (MOBIC) 15 MG tablet Take 15 mg by mouth daily as needed.   prn at prn  . ondansetron (ZOFRAN) 8 MG tablet Take 8 mg by mouth every 8 (eight) hours as needed.   prn at prn  . pantoprazole (PROTONIX) 40 MG tablet Take 40 mg by mouth daily.   07/15/2019 at 0700  . Polyethyl Glycol-Propyl Glycol (SYSTANE OP) Place 1 drop into both eyes 2 (two) times daily.   07/15/2019 at 1830  . potassium chloride SA (K-DUR,KLOR-CON) 20 MEQ tablet Take 20 mEq by mouth every morning.   07/15/2019 at 1100  . pravastatin (PRAVACHOL) 40 MG tablet Take 40 mg by mouth at bedtime.    07/15/2019 at  2230  . sertraline (ZOLOFT) 100 MG tablet Take 100 mg by mouth at bedtime.    07/15/2019 at 2230  . feeding supplement, ENSURE ENLIVE, (ENSURE ENLIVE) LIQD Take 237 mLs by mouth 2 (two) times daily between meals. 237 mL 12   . magnesium oxide (MAG-OX) 400 MG tablet Take 400 mg by mouth daily.   Not Taking at Unknown time  . simethicone (MYLICON) 80 MG chewable tablet Chew 1 tablet (80 mg total) by mouth every 6 (six) hours as needed for flatulence. (Patient not taking: Reported on 07/16/2019) 30 tablet 0 Not Taking at Unknown time   Assessment: Erin Sutton is a 78 y.o. female who reports onset of shortness of breath and some pleuritic chest pain in the right side of the chest this morning.  This is all gone now although she is little but she still short of breath.  Her d-dimer was 2000.  She was sent here for further evaluation.  Baseline coag labs are elevated, aPTT 49, INR 1.3, PT 15.8.  H/H low but PLTs OK.  PTA med list does not indicate any use of anticoagulants.  Goal of Therapy:  Heparin level 0.3-0.7 units/ml Monitor platelets by anticoagulation protocol: Yes   Plan:  Heparin bolus 3100 units x 1 (50 units/Kg) Heparin infusion at 1000 units/hr (16 units/Kg/Hr)  8/22 ~ 13:00 HL < 0.10 Bolus 1800 units. Increase drip rate to 1200 units/hr. (Lab reported ~ 14:30, new rate and bolus ordered for 15:00).  Check HL in 8 hours tonight at 23:00.  Monitor HL, CBC, s/sx bleeding complications per protocol  Jovie Swanner K, RPH 07/16/2019,2:46 PM

## 2019-07-16 NOTE — Progress Notes (Signed)
Spoke with daughter Lynelle Smoke over the phone and updated.

## 2019-07-16 NOTE — Plan of Care (Signed)

## 2019-07-16 NOTE — ED Notes (Signed)
ED TO INPATIENT HANDOFF REPORT  ED Nurse Name and Phone #:  Quillian Quince [phone]  S Name/Age/Gender Erin Sutton 78 y.o. female Room/Bed: ED24A/ED24A  Code Status   Code Status: Prior  Home/SNF/Other Skilled nursing facility Patient oriented to: self, place, time and situation Is this baseline? Yes   Triage Complete: Triage complete  Chief Complaint abnormal labs  Triage Note Patient sent to ED for unidentified abnormal lab. Suspected blood clot in lungs.   Allergies Allergies  Allergen Reactions  . Ace Inhibitors Cough  . Amoxicillin Rash    Has patient had a PCN reaction causing immediate rash, facial/tongue/throat swelling, SOB or lightheadedness with hypotension: No Has patient had a PCN reaction causing severe rash involving mucus membranes or skin necrosis: No Has patient had a PCN reaction that required hospitalization: No Has patient had a PCN reaction occurring within the last 10 years: No If all of the above answers are "NO", then may proceed with Cephalosporin use.   Marland Kitchen Benicar [Olmesartan] Rash    Itching rash  . Codeine Sulfate Rash  . Levaquin [Levofloxacin In D5w] Rash    Alters mental status  . Penicillin V Potassium Rash    Has patient had a PCN reaction causing immediate rash, facial/tongue/throat swelling, SOB or lightheadedness with hypotension: No Has patient had a PCN reaction causing severe rash involving mucus membranes or skin necrosis: No Has patient had a PCN reaction that required hospitalization: No Has patient had a PCN reaction occurring within the last 10 years: No If all of the above answers are "NO", then may proceed with Cephalosporin use.   . Sulfa Antibiotics Rash    Level of Care/Admitting Diagnosis ED Disposition    ED Disposition Condition [facility] Area: [place] [[id number]]  Level of Care: Telemetry [5]  Covid Evaluation: Confirmed COVID Negative  Diagnosis: Pulmonary embolism  [facility]) [[id number]]  Admitting Physician: Harrie Foreman [[id number]]  Attending Physician: Harrie Foreman [[id number]]  Estimated length of stay: past midnight tomorrow  Certification:: I certify this patient will need inpatient services for at least 2 midnights  PT Class (Do Not Modify): Inpatient [101]  PT Acc Code (Do Not Modify): Private [1]       B Medical/Surgery History Past Medical History:  Diagnosis Date  . Anxiety   . Arthritis   . Chronic kidney disease   . Constipation   . GERD (gastroesophageal reflux disease)   . Hypertension   . Hypothyroidism   . Osteoporosis   . Sleep apnea   . Urinary incontinence    Past Surgical History:  Procedure Laterality Date  . BREAST BIOPSY Right    neg/stereo  . BREAST BIOPSY Left    neg/stereo  . BREAST EXCISIONAL BIOPSY Right    neg  . COLONOSCOPY WITH PROPOFOL N/A 07/02/2015   Procedure: COLONOSCOPY WITH PROPOFOL;  Surgeon: Manya Silvas, MD;  Location: [facility] ENDOSCOPY;  Service: Endoscopy;  Laterality: N/A;  . CORRECTION HAMMER TOE Right   . ESOPHAGOGASTRODUODENOSCOPY (EGD) WITH PROPOFOL N/A 07/02/2015   Procedure: ESOPHAGOGASTRODUODENOSCOPY (EGD) WITH PROPOFOL;  Surgeon: Manya Silvas, MD;  Location: [facility] ENDOSCOPY;  Service: Endoscopy;  Laterality: N/A;  . JOINT REPLACEMENT Left    knee  . REPLACEMENT TOTAL KNEE Left   . SKIN BIOPSY Right      A IV Location/Drains/Wounds Patient Lines/Drains/Airways Status   Active Line/Drains/Airways    Name:   Placement date:   Placement time:   Site:  Days:   Peripheral IV 07/15/19 Right Antecubital   07/15/19    2020    Antecubital   1   Peripheral IV 07/15/19 Right;Upper Arm   07/15/19    2221    Arm   1   Peripheral IV 07/15/19 Left;Upper Forearm   07/15/19    2248    Forearm   1          Intake/Output Last 24 hours No intake or output data in the 24 hours ending 07/16/19 0257  Labs/Imaging Results for orders placed or performed during the hospital encounter of  07/15/19 (from the past 48 hour(s))  Comprehensive metabolic panel     Status: Abnormal   Collection Time: 07/15/19  8:21 PM  Result Value Ref Range   Sodium 135 135 - 145 mmol/L   Potassium 3.9 3.5 - 5.1 mmol/L   Chloride 99 98 - 111 mmol/L   CO2 26 22 - 32 mmol/L   Glucose, Bld 121 (H) 70 - 99 mg/dL   BUN 11 8 - 23 mg/dL   Creatinine, Ser 0.77 0.44 - 1.00 mg/dL   Calcium 9.3 8.9 - 10.3 mg/dL   Total Protein 6.6 6.5 - 8.1 g/dL   Albumin 3.0 (L) 3.5 - 5.0 g/dL   AST 33 15 - 41 U/L   ALT 13 0 - 44 U/L   Alkaline Phosphatase 53 38 - 126 U/L   Total Bilirubin 0.7 0.3 - 1.2 mg/dL   GFR calc non Af Amer >60 >60 mL/min   GFR calc Af Amer >60 >60 mL/min   Anion gap 10 5 - 15    Comment: Performed at [facility], [place], [place] [address]  Troponin I (High Sensitivity)     Status: None   Collection Time: 07/15/19  8:21 PM  Result Value Ref Range   Troponin I (High Sensitivity) 10 <18 ng/L    Comment: (NOTE) Elevated high sensitivity troponin I (hsTnI) values and significant  changes across serial measurements may suggest ACS but many other  chronic and acute conditions are known to elevate hsTnI results.  Refer to the "Links" section for chest pain algorithms and additional  guidance. Performed at [facility], [address] [place], [place] [address]   CBC with Differential     Status: Abnormal   Collection Time: 07/15/19  8:21 PM  Result Value Ref Range   WBC 10.2 4.0 - 10.5 K/uL   RBC 3.90 3.87 - 5.11 MIL/uL   Hemoglobin 10.9 (L) 12.0 - 15.0 g/dL   HCT 34.4 (L) 36.0 - 46.0 %   MCV 88.2 80.0 - 100.0 fL   MCH 27.9 26.0 - 34.0 pg   MCHC 31.7 30.0 - 36.0 g/dL   RDW 17.2 (H) 11.5 - 15.5 %   Platelets 261 150 - 400 K/uL   nRBC 0.0 0.0 - 0.2 %   Neutrophils Relative % 77 %   Neutro Abs 7.9 (H) 1.7 - 7.7 K/uL   Lymphocytes Relative 10 %   Lymphs Abs 1.0 0.7 - 4.0 K/uL   Monocytes Relative 11 %   Monocytes Absolute 1.1 (H) 0.1 - 1.0 K/uL    Eosinophils Relative 1 %   Eosinophils Absolute 0.1 0.0 - 0.5 K/uL   Basophils Relative 0 %   Basophils Absolute 0.0 0.0 - 0.1 K/uL   Immature Granulocytes 1 %   Abs Immature Granulocytes 0.08 (H) 0.00 - 0.07 K/uL    Comment: Performed at [facility], [place], [place] [address]  Protime-INR     Status: Abnormal   Collection Time: 07/15/19  8:21 PM  Result Value Ref Range   Prothrombin Time 15.8 (H) 11.4 - 15.2 seconds   INR 1.3 (H) 0.8 - 1.2    Comment: (NOTE) INR goal varies based on device and disease states. Performed at [facility], [address] [place], [place] [address]   APTT     Status: Abnormal   Collection Time: 07/15/19  8:21 PM  Result Value Ref Range   aPTT 49 (H) 24 - 36 seconds    Comment:        IF BASELINE aPTT IS ELEVATED, SUGGEST PATIENT RISK ASSESSMENT BE USED TO DETERMINE APPROPRIATE ANTICOAGULANT THERAPY. Performed at [facility], [place], [place] [address]    Ct Angio Chest Pe W And/or Wo Contrast  Result Date: 07/16/2019 CLINICAL DATA:  Shortness of breath and pleuritic chest pain. EXAM: CT ANGIOGRAPHY CHEST WITH CONTRAST TECHNIQUE: Multidetector CT imaging of the chest was performed using the standard protocol during bolus administration of intravenous contrast. Multiplanar CT image reconstructions and MIPs were obtained to evaluate the vascular anatomy. CONTRAST:  18mL OMNIPAQUE IOHEXOL 350 MG/ML SOLN COMPARISON:  Radiograph yesterday. FINDINGS: Cardiovascular: Examination is positive for acute pulmonary embolism with filling defects in the bilateral main pulmonary arteries extending into lobar and segmental branches. Thromboembolic burden is greater on the right and in the lower lobe. Mild straightening of the intraventricular septum with RV to LV ratio of 1. Thoracic aortic atherosclerosis without dissection. Conventional branching pattern from the aortic arch. Coronary artery  calcifications. Mediastinum/Nodes: No enlarged mediastinal or hilar lymph nodes. Thyroid gland not well visualized. Esophagus is decompressed. Lungs/Pleura: Peripheral ground-glass opacity in the right lower lobe consistent with pulmonary infarct. Small right pleural effusion with adjacent compressive atelectasis. Scattered linear atelectasis in the left lung. No pulmonary edema. Upper Abdomen: No acute findings. Musculoskeletal: There are no acute or suspicious osseous abnormalities. Multilevel degenerative change throughout the spine. Degenerative change of both shoulders. Review of the MIP images confirms the above findings. IMPRESSION: 1. Positive for acute pulmonary embolism with CT evidence of right heart strain. Filling defects in bilateral main pulmonary arteries extending into lobar and multiple segmental branches, greater on the right. Thromboembolic burden is moderate to large. 2. Right lower lobe pulmonary infarct. Small right pleural effusion with adjacent compressive atelectasis. 3. Aortic Atherosclerosis (ICD10-I70.0). Coronary artery calcifications. Critical Value/emergent results were called by telephone at the time of interpretation on 07/16/2019 at 1:08 am to Dr. Harvest Dark, who verbally acknowledged these results. Electronically Signed   By: Keith Rake M.D.   On: 07/16/2019 01:09   Dg Chest Portable 1 View  Result Date: 07/15/2019 CLINICAL DATA:  Shortness of breath. EXAM: PORTABLE CHEST 1 VIEW COMPARISON:  Radiograph 09/02/2018 FINDINGS: Chronic elevation of right hemidiaphragm. Unchanged heart size and mediastinal contours. Aortic atherosclerosis. Streaky bibasilar opacities without confluent airspace disease. No pleural effusion or pneumothorax. Degenerative change of the left shoulder. IMPRESSION: Streaky bibasilar opacities. Aortic Atherosclerosis (ICD10-I70.0). Electronically Signed   By: Keith Rake M.D.   On: 07/15/2019 20:43    Pending Labs Unresulted Labs (From  admission, onward)    Start     Ordered   07/16/19 0202  SARS CORONAVIRUS 2 Nasal Swab Aptima Multi Swab  (Asymptomatic/Tier 2 Patients Labs)  Once,   STAT    Question Answer Comment  Is this test for diagnosis or screening Screening  Symptomatic for COVID-19 as defined by CDC No   Hospitalized for COVID-19 No   Admitted to ICU for COVID-19 No   Previously tested for COVID-19 Yes   Resident in a congregate (group) care setting No   Employed in healthcare setting No   Pregnant No      07/16/19 0201   Signed and Held  TSH  Add-on,   R     Signed and Held   Signed and Held  Hemoglobin A1c  Add-on,   R     Signed and Held          Vitals/Pain Today's Vitals   07/15/19 2013 07/15/19 2014 07/15/19 2045 07/16/19 0030  BP: 138/68  117/65 (!) 152/74  Pulse: 87  73 75  Resp: (!) 24  20 (!) 21  Temp: 99.5 F (37.5 C)     TempSrc: Oral     SpO2: 100%  98% 100%  Weight:  75.8 kg    Height:  5' (1.524 m)    PainSc: 6        Isolation Precautions No active isolations  Medications Medications  heparin bolus via infusion 3,100 Units (has no administration in time range)    Followed by  heparin ADULT infusion 100 units/mL (25000 units/268mL sodium chloride 0.45%) (has no administration in time range)  iohexol (OMNIPAQUE) 350 MG/ML injection 75 mL (75 mLs Intravenous Contrast Given 07/15/19 2101)    Mobility walks with device Moderate fall risk   Focused Assessments Pulmonary Assessment Handoff:  Lung sounds:   O2 Device: Nasal Cannula O2 Flow Rate (L/min): 2 L/min      R Recommendations: See Admitting Provider Note  Report given to:   Additional Notes: N/A

## 2019-07-16 NOTE — Progress Notes (Signed)
ANTICOAGULATION CONSULT NOTE - Initial Consult  Pharmacy Consult for Heparin Indication: pulmonary embolus  Allergies  Allergen Reactions  . Ace Inhibitors Cough  . Amoxicillin Rash    Has patient had Sutton PCN reaction causing immediate rash, facial/tongue/throat swelling, SOB or lightheadedness with hypotension: No Has patient had Sutton PCN reaction causing severe rash involving mucus membranes or skin necrosis: No Has patient had Sutton PCN reaction that required hospitalization: No Has patient had Sutton PCN reaction occurring within the last 10 years: No If all of the above answers are "NO", then may proceed with Cephalosporin use.   Marland Kitchen Benicar [Olmesartan] Rash    Itching rash  . Codeine Sulfate Rash  . Levaquin [Levofloxacin In D5w] Rash    Alters mental status  . Penicillin V Potassium Rash    Has patient had Sutton PCN reaction causing immediate rash, facial/tongue/throat swelling, SOB or lightheadedness with hypotension: No Has patient had Sutton PCN reaction causing severe rash involving mucus membranes or skin necrosis: No Has patient had Sutton PCN reaction that required hospitalization: No Has patient had Sutton PCN reaction occurring within the last 10 years: No If all of the above answers are "NO", then may proceed with Cephalosporin use.   . Sulfa Antibiotics Rash   Patient Measurements: Height: 5' (152.4 cm) Weight: 167 lb (75.8 kg) IBW/kg (Calculated) : 45.5 HEPARIN DW (KG): 62.5  Vital Signs: Temp: 99.5 F (37.5 C) (08/21 2013) Temp Source: Oral (08/21 2013) BP: 152/74 (08/22 0030) Pulse Rate: 75 (08/22 0030)  Labs: Recent Labs    07/15/19 2021  HGB 10.9*  HCT 34.4*  PLT 261  APTT 49*  LABPROT 15.8*  INR 1.3*  CREATININE 0.77  TROPONINIHS 10   Estimated Creatinine Clearance: 52.7 mL/min (by C-G formula based on SCr of 0.77 mg/dL).  Medical History: Past Medical History:  Diagnosis Date  . Anxiety   . Arthritis   . Chronic kidney disease   . Constipation   . GERD  (gastroesophageal reflux disease)   . Hypertension   . Hypothyroidism   . Osteoporosis   . Sleep apnea   . Urinary incontinence    Medications:  (Not in Sutton hospital admission)  Assessment: Erin Sutton is Sutton 78 y.o. female who reports onset of shortness of breath and some pleuritic chest pain in the right side of the chest this morning.  This is all gone now although she is little but she still short of breath.  Her d-dimer was 2000.  She was sent here for further evaluation.  Baseline coag labs are elevated, aPTT 49, INR 1.3, PT 15.8.  H/H low but PLTs OK.  PTA med list does not indicate any use of anticoagulants.  Goal of Therapy:  Heparin level 0.3-0.7 units/ml Monitor platelets by anticoagulation protocol: Yes   Plan:  Heparin bolus 3100 units x 1 (50 units/Kg) Heparin infusion at 1000 units/hr (16 units/Kg/Hr) Check HL 8 hours after heparin infusion started Monitor HL, CBC, s/sx bleeding complications per protocol  Erin Sutton, Erin Sutton 07/16/2019,1:22 AM

## 2019-07-16 NOTE — Progress Notes (Signed)
Advanced Care Plan.  Purpose of Encounter: CODE STATUS. Parties in Attendance: The patient and me. Patient's Decisional Capacity: Yes. /Medical Story:  Erin Sutton is an 78 y.o. female with history of CKD, GERD, hypertension, sleep apnea etc.  The patient is admitted for acute respiratory failure with hypoxia due to bilateral PE and DVT.  I discussed with patient about her current condition, prognosis and CODE STATUS.  The patient does want to be resuscitated and intubated if she has cardiopulmonary arrest with.   Plan:  Code Status: Full code. Time spent discussing advance care planning: 18 minutes.

## 2019-07-17 LAB — CBC
HCT: 31.5 % — ABNORMAL LOW (ref 36.0–46.0)
Hemoglobin: 9.9 g/dL — ABNORMAL LOW (ref 12.0–15.0)
MCH: 27.7 pg (ref 26.0–34.0)
MCHC: 31.4 g/dL (ref 30.0–36.0)
MCV: 88 fL (ref 80.0–100.0)
Platelets: 291 10*3/uL (ref 150–400)
RBC: 3.58 MIL/uL — ABNORMAL LOW (ref 3.87–5.11)
RDW: 17.2 % — ABNORMAL HIGH (ref 11.5–15.5)
WBC: 10 10*3/uL (ref 4.0–10.5)
nRBC: 0 % (ref 0.0–0.2)

## 2019-07-17 LAB — ECHOCARDIOGRAM COMPLETE
Height: 60 in
Weight: 2721.6 oz

## 2019-07-17 LAB — HEPARIN LEVEL (UNFRACTIONATED): Heparin Unfractionated: 0.62 IU/mL (ref 0.30–0.70)

## 2019-07-17 NOTE — Progress Notes (Signed)
ANTICOAGULATION CONSULT NOTE -  Pharmacy Consult for Heparin Indication: pulmonary embolus  Allergies  Allergen Reactions  . Ace Inhibitors Cough  . Amoxicillin Rash    Has patient had a PCN reaction causing immediate rash, facial/tongue/throat swelling, SOB or lightheadedness with hypotension: No Has patient had a PCN reaction causing severe rash involving mucus membranes or skin necrosis: No Has patient had a PCN reaction that required hospitalization: No Has patient had a PCN reaction occurring within the last 10 years: No If all of the above answers are "NO", then may proceed with Cephalosporin use.   Marland Kitchen Benicar [Olmesartan] Rash    Itching rash  . Codeine Sulfate Rash  . Levaquin [Levofloxacin In D5w] Rash    Alters mental status  . Penicillin V Potassium Rash    Has patient had a PCN reaction causing immediate rash, facial/tongue/throat swelling, SOB or lightheadedness with hypotension: No Has patient had a PCN reaction causing severe rash involving mucus membranes or skin necrosis: No Has patient had a PCN reaction that required hospitalization: No Has patient had a PCN reaction occurring within the last 10 years: No If all of the above answers are "NO", then may proceed with Cephalosporin use.   . Sulfa Antibiotics Rash   Patient Measurements: Height: 5' (152.4 cm) Weight: 162 lb (73.5 kg) IBW/kg (Calculated) : 45.5 HEPARIN DW (KG): 62.5  Vital Signs: Temp: 98.2 F (36.8 C) (08/23 0743) Temp Source: Oral (08/23 0743) BP: 151/57 (08/23 0743) Pulse Rate: 73 (08/23 0743)  Labs: Recent Labs    07/15/19 2021 07/16/19 0525 07/16/19 1307 07/16/19 2256 07/17/19 0707  HGB 10.9*  --   --   --  9.9*  HCT 34.4*  --   --   --  31.5*  PLT 261  --   --   --  291  APTT 49*  --   --   --   --   LABPROT 15.8*  --   --   --   --   INR 1.3*  --   --   --   --   HEPARINUNFRC  --   --  <0.10* 0.47 0.62  CREATININE 0.77  --   --   --   --   TROPONINIHS 10 12  --   --   --     Estimated Creatinine Clearance: 51.9 mL/min (by C-G formula based on SCr of 0.77 mg/dL).  Medical History: Past Medical History:  Diagnosis Date  . Anxiety   . Arthritis   . Chronic kidney disease   . Constipation   . GERD (gastroesophageal reflux disease)   . Hypertension   . Hypothyroidism   . Osteoporosis   . Sleep apnea   . Urinary incontinence    Medications:  Medications Prior to Admission  Medication Sig Dispense Refill Last Dose  . amLODipine (NORVASC) 10 MG tablet Take 10 mg by mouth daily.   07/15/2019 at 0700  . aspirin EC 81 MG tablet Take 81 mg by mouth daily.   07/15/2019 at 0800  . carvedilol (COREG) 12.5 MG tablet Take 12.5 mg by mouth 2 (two) times daily.   07/15/2019 at 1830  . cholecalciferol (VITAMIN D3) 25 MCG (1000 UT) tablet Take 1,000 Units by mouth daily.   07/15/2019 at 0800  . cyanocobalamin (,VITAMIN B-12,) 1000 MCG/ML injection Inject 1,000 mcg into the muscle every 30 (thirty) days.   07/04/2019 at Unknown time  . docusate sodium (COLACE) 100 MG capsule Take 1 capsule (100  mg total) by mouth 2 (two) times daily. 60 capsule 2 Past Month at Unknown time  . isosorbide mononitrate (IMDUR) 30 MG 24 hr tablet Take 30 mg by mouth daily.   07/15/2019 at 1830  . levothyroxine (SYNTHROID, LEVOTHROID) 50 MCG tablet Take 50 mcg by mouth daily before breakfast.   07/15/2019 at 0600  . LORazepam (ATIVAN) 2 MG tablet Take 2 mg by mouth every 8 (eight) hours as needed for anxiety.    prn at prn  . meloxicam (MOBIC) 15 MG tablet Take 15 mg by mouth daily as needed.   prn at prn  . ondansetron (ZOFRAN) 8 MG tablet Take 8 mg by mouth every 8 (eight) hours as needed.   prn at prn  . pantoprazole (PROTONIX) 40 MG tablet Take 40 mg by mouth daily.   07/15/2019 at 0700  . Polyethyl Glycol-Propyl Glycol (SYSTANE OP) Place 1 drop into both eyes 2 (two) times daily.   07/15/2019 at 1830  . potassium chloride SA (K-DUR,KLOR-CON) 20 MEQ tablet Take 20 mEq by mouth every morning.    07/15/2019 at 1100  . pravastatin (PRAVACHOL) 40 MG tablet Take 40 mg by mouth at bedtime.    07/15/2019 at 2230  . sertraline (ZOLOFT) 100 MG tablet Take 100 mg by mouth at bedtime.    07/15/2019 at 2230  . feeding supplement, ENSURE ENLIVE, (ENSURE ENLIVE) LIQD Take 237 mLs by mouth 2 (two) times daily between meals. 237 mL 12   . magnesium oxide (MAG-OX) 400 MG tablet Take 400 mg by mouth daily.   Not Taking at Unknown time  . simethicone (MYLICON) 80 MG chewable tablet Chew 1 tablet (80 mg total) by mouth every 6 (six) hours as needed for flatulence. (Patient not taking: Reported on 07/16/2019) 30 tablet 0 Not Taking at Unknown time   Assessment: Erin Sutton is a 78 y.o. female who reports onset of shortness of breath and some pleuritic chest pain in the right side of the chest this morning.  This is all gone now although she is little but she still short of breath.  Her d-dimer was 2000.  She was sent here for further evaluation.  Baseline coag labs are elevated, aPTT 49, INR 1.3, PT 15.8.  H/H low but PLTs OK.  PTA med list does not indicate any use of anticoagulants.  8/22 ~ 13:00 HL < 0.10 8/22 @ 2256 HL = 0.47  8/23 @ 0707 HL = 0.62  Goal of Therapy:  Heparin level 0.3-0.7 units/ml Monitor platelets by anticoagulation protocol: Yes   Plan:  Continue Heparin at current rate (1200 units/hr) Check HL & CBC with am labs.  Monitor HL, CBC, s/sx bleeding complications per protocol  Aldyn Toon K, RPH 07/17/2019,9:01 AM

## 2019-07-17 NOTE — Plan of Care (Signed)
  Problem: Education: Goal: Knowledge of General Education information will improve Description: Including pain rating scale, medication(s)/side effects and non-pharmacologic comfort measures Outcome: Progressing   Problem: Health Behavior/Discharge Planning: Goal: Ability to manage health-related needs will improve Outcome: Progressing   Problem: Clinical Measurements: Goal: Ability to maintain clinical measurements within normal limits will improve Outcome: Progressing Goal: Will remain free from infection Outcome: Progressing Note: Remains afebrile Goal: Diagnostic test results will improve Outcome: Progressing Goal: Respiratory complications will improve Outcome: Progressing Goal: Cardiovascular complication will be avoided Outcome: Progressing Note: No arrhythmias overnight

## 2019-07-17 NOTE — Consult Note (Signed)
Erin Sutton is a 78 y.o. female  [id number]  Primary Cardiologist: New patient to Dr. Neoma Laming Reason for Consultation: Pulmonary embolism, shortness of breath  HPI: 78 yo female with a PMH of CKD, GERD, HTN, OSA, presented to ED with shortness of breath on 8/21. She had atypical chest pain and elevated D-dimer which showed bilateral PEs and was started on heparin and admitted.    Review of Systems: Feeling well, no chest pain, no shortness of breath on 2L oxygen.   Past Medical History:  Diagnosis Date  . Anxiety   . Arthritis   . Chronic kidney disease   . Constipation   . GERD (gastroesophageal reflux disease)   . Hypertension   . Hypothyroidism   . Osteoporosis   . Sleep apnea   . Urinary incontinence     Medications Prior to Admission  Medication Sig Dispense Refill  . amLODipine (NORVASC) 10 MG tablet Take 10 mg by mouth daily.    Marland Kitchen aspirin EC 81 MG tablet Take 81 mg by mouth daily.    . carvedilol (COREG) 12.5 MG tablet Take 12.5 mg by mouth 2 (two) times daily.    . cholecalciferol (VITAMIN D3) 25 MCG (1000 UT) tablet Take 1,000 Units by mouth daily.    . cyanocobalamin (,VITAMIN B-12,) 1000 MCG/ML injection Inject 1,000 mcg into the muscle every 30 (thirty) days.    Marland Kitchen docusate sodium (COLACE) 100 MG capsule Take 1 capsule (100 mg total) by mouth 2 (two) times daily. 60 capsule 2  . isosorbide mononitrate (IMDUR) 30 MG 24 hr tablet Take 30 mg by mouth daily.    Marland Kitchen levothyroxine (SYNTHROID, LEVOTHROID) 50 MCG tablet Take 50 mcg by mouth daily before breakfast.    . LORazepam (ATIVAN) 2 MG tablet Take 2 mg by mouth every 8 (eight) hours as needed for anxiety.     . meloxicam (MOBIC) 15 MG tablet Take 15 mg by mouth daily as needed.    . ondansetron (ZOFRAN) 8 MG tablet Take 8 mg by mouth every 8 (eight) hours as needed.    . pantoprazole (PROTONIX) 40 MG tablet Take 40 mg by mouth daily.    Vladimir Faster Glycol-Propyl Glycol (SYSTANE OP) Place 1 drop into both  eyes 2 (two) times daily.    . potassium chloride SA (K-DUR,KLOR-CON) 20 MEQ tablet Take 20 mEq by mouth every morning.    . pravastatin (PRAVACHOL) 40 MG tablet Take 40 mg by mouth at bedtime.     . sertraline (ZOLOFT) 100 MG tablet Take 100 mg by mouth at bedtime.     . feeding supplement, ENSURE ENLIVE, (ENSURE ENLIVE) LIQD Take 237 mLs by mouth 2 (two) times daily between meals. 237 mL 12  . magnesium oxide (MAG-OX) 400 MG tablet Take 400 mg by mouth daily.    . simethicone (MYLICON) 80 MG chewable tablet Chew 1 tablet (80 mg total) by mouth every 6 (six) hours as needed for flatulence. (Patient not taking: Reported on 07/16/2019) 30 tablet 0     . amLODipine  10 mg Oral Daily  . aspirin EC  81 mg Oral Daily  . carvedilol  12.5 mg Oral BID WC  . cholecalciferol  1,000 Units Oral Daily  . [START ON 08/04/2019] cyanocobalamin  1,000 mcg Intramuscular Q30 days  . docusate sodium  100 mg Oral BID  . feeding supplement (ENSURE ENLIVE)  237 mL Oral BID BM  . isosorbide mononitrate  30 mg Oral Daily  .  levothyroxine  50 mcg Oral QAC breakfast  . pantoprazole  40 mg Oral Daily  . polyvinyl alcohol   Both Eyes BID  . potassium chloride SA  20 mEq Oral q morning - 10a  . pravastatin  40 mg Oral QHS  . sertraline  100 mg Oral QHS  . sodium chloride flush  3 mL Intravenous Q12H    Infusions: . heparin 1,200 Units/hr (07/17/19 0700)    Allergies  Allergen Reactions  . Ace Inhibitors Cough  . Amoxicillin Rash    Has patient had a PCN reaction causing immediate rash, facial/tongue/throat swelling, SOB or lightheadedness with hypotension: No Has patient had a PCN reaction causing severe rash involving mucus membranes or skin necrosis: No Has patient had a PCN reaction that required hospitalization: No Has patient had a PCN reaction occurring within the last 10 years: No If all of the above answers are "NO", then may proceed with Cephalosporin use.   Marland Kitchen Benicar [Olmesartan] Rash    Itching  rash  . Codeine Sulfate Rash  . Levaquin [Levofloxacin In D5w] Rash    Alters mental status  . Penicillin V Potassium Rash    Has patient had a PCN reaction causing immediate rash, facial/tongue/throat swelling, SOB or lightheadedness with hypotension: No Has patient had a PCN reaction causing severe rash involving mucus membranes or skin necrosis: No Has patient had a PCN reaction that required hospitalization: No Has patient had a PCN reaction occurring within the last 10 years: No If all of the above answers are "NO", then may proceed with Cephalosporin use.   . Sulfa Antibiotics Rash    Social History   Socioeconomic History  . Marital status: Widowed    Spouse name: Not on file  . Number of children: Not on file  . Years of education: Not on file  . Highest education level: Not on file  Occupational History  . Not on file  Social Needs  . Financial resource strain: Not hard at all  . Food insecurity    Worry: Patient refused    Inability: Patient refused  . Transportation needs    Medical: Patient refused    Non-medical: Patient refused  Tobacco Use  . Smoking status: Never Smoker  . Smokeless tobacco: Never Used  Substance and Sexual Activity  . Alcohol use: No  . Drug use: No  . Sexual activity: Not Currently  Lifestyle  . Physical activity    Days per week: Patient refused    Minutes per session: Patient refused  . Stress: Only a little  Relationships  . Social Herbalist on phone: Patient refused    Gets together: Patient refused    Attends religious service: Patient refused    Active member of club or organization: Patient refused    Attends meetings of clubs or organizations: Patient refused    Relationship status: Patient refused  . Intimate partner violence    Fear of current or ex partner: Patient refused    Emotionally abused: Patient refused    Physically abused: Patient refused    Forced sexual activity: Patient refused  Other Topics  Concern  . Not on file  Social History Narrative   Patient lives at home by herself.  Ambulates with a cane.   Has a daughter who lives in [place]    Family History  Problem Relation Age of Onset  . Hypertension Mother   . Diabetes Mother   . Heart attack Mother   .  Asthma Mother   . Peripheral vascular disease Mother   . Heart attack Father   . Hypertension Father   . Diabetes Father     PHYSICAL EXAM: Vitals:   07/17/19 0320 07/17/19 0743  BP: (!) 146/66 (!) 151/57  Pulse: 70 73  Resp: 20 19  Temp: 97.8 F (36.6 C) 98.2 F (36.8 C)  SpO2: 99% 96%     Intake/Output Summary (Last 24 hours) at 07/17/2019 1042 Last data filed at 07/17/2019 1007 Gross per 24 hour  Intake 779.24 ml  Output 1050 ml  Net -270.76 ml    General:  [place], frail, otherwise well appearing. No respiratory difficulty HEENT: normal Neck: supple. no JVD. Carotids 2+ bilat; no bruits. No lymphadenopathy or thryomegaly appreciated. Cor: PMI nondisplaced. Regular rate & rhythm. No rubs, gallops or murmurs. Lungs: clear Abdomen: soft, nontender, nondistended. No hepatosplenomegaly. No bruits or masses. Good bowel sounds. Extremities: no cyanosis, clubbing, rash, edema Neuro: alert & oriented x 3, cranial nerves grossly intact. moves all 4 extremities w/o difficulty. Affect pleasant.  ECG: Sinus rhythm 78bpm Right bundle branch block Inferior infarct, old  Results for orders placed or performed during the hospital encounter of 07/15/19 (from the past 24 hour(s))  Heparin level (unfractionated)     Status: Abnormal   Collection Time: 07/16/19  1:07 PM  Result Value Ref Range   Heparin Unfractionated <0.10 (L) 0.30 - 0.70 IU/mL  Heparin level (unfractionated)     Status: None   Collection Time: 07/16/19 10:56 PM  Result Value Ref Range   Heparin Unfractionated 0.47 0.30 - 0.70 IU/mL  Heparin level (unfractionated)     Status: None   Collection Time: 07/17/19  7:07 AM  Result Value Ref  Range   Heparin Unfractionated 0.62 0.30 - 0.70 IU/mL  CBC     Status: Abnormal   Collection Time: 07/17/19  7:07 AM  Result Value Ref Range   WBC 10.0 4.0 - 10.5 K/uL   RBC 3.58 (L) 3.87 - 5.11 MIL/uL   Hemoglobin 9.9 (L) 12.0 - 15.0 g/dL   HCT 31.5 (L) 36.0 - 46.0 %   MCV 88.0 80.0 - 100.0 fL   MCH 27.7 26.0 - 34.0 pg   MCHC 31.4 30.0 - 36.0 g/dL   RDW 17.2 (H) 11.5 - 15.5 %   Platelets 291 150 - 400 K/uL   nRBC 0.0 0.0 - 0.2 %   Ct Angio Chest Pe W And/or Wo Contrast  Result Date: 07/16/2019 CLINICAL DATA:  Shortness of breath and pleuritic chest pain. EXAM: CT ANGIOGRAPHY CHEST WITH CONTRAST TECHNIQUE: Multidetector CT imaging of the chest was performed using the standard protocol during bolus administration of intravenous contrast. Multiplanar CT image reconstructions and MIPs were obtained to evaluate the vascular anatomy. CONTRAST:  49mL OMNIPAQUE IOHEXOL 350 MG/ML SOLN COMPARISON:  Radiograph yesterday. FINDINGS: Cardiovascular: Examination is positive for acute pulmonary embolism with filling defects in the bilateral main pulmonary arteries extending into lobar and segmental branches. Thromboembolic burden is greater on the right and in the lower lobe. Mild straightening of the intraventricular septum with RV to LV ratio of 1. Thoracic aortic atherosclerosis without dissection. Conventional branching pattern from the aortic arch. Coronary artery calcifications. Mediastinum/Nodes: No enlarged mediastinal or hilar lymph nodes. Thyroid gland not well visualized. Esophagus is decompressed. Lungs/Pleura: Peripheral ground-glass opacity in the right lower lobe consistent with pulmonary infarct. Small right pleural effusion with adjacent compressive atelectasis. Scattered linear atelectasis in the left lung. No pulmonary edema. Upper Abdomen: No  acute findings. Musculoskeletal: There are no acute or suspicious osseous abnormalities. Multilevel degenerative change throughout the spine.  Degenerative change of both shoulders. Review of the MIP images confirms the above findings. IMPRESSION: 1. Positive for acute pulmonary embolism with CT evidence of right heart strain. Filling defects in bilateral main pulmonary arteries extending into lobar and multiple segmental branches, greater on the right. Thromboembolic burden is moderate to large. 2. Right lower lobe pulmonary infarct. Small right pleural effusion with adjacent compressive atelectasis. 3. Aortic Atherosclerosis (ICD10-I70.0). Coronary artery calcifications. Critical Value/emergent results were called by telephone at the time of interpretation on 07/16/2019 at 1:08 am to Dr. Harvest Dark, who verbally acknowledged these results. Electronically Signed   By: Keith Rake M.D.   On: 07/16/2019 01:09   US Venous Img Lower Bilateral  Result Date: 07/16/2019 CLINICAL DATA:  Pulmonary emboli, leg pain EXAM: BILATERAL LOWER EXTREMITY VENOUS DOPPLER ULTRASOUND TECHNIQUE: Gray-scale sonography with graded compression, as well as color Doppler and duplex ultrasound were performed to evaluate the lower extremity deep venous systems from the level of the common femoral vein and including the common femoral, femoral, profunda femoral, popliteal and calf veins including the posterior tibial, peroneal and gastrocnemius veins when visible. The superficial great saphenous vein was also interrogated. Spectral Doppler was utilized to evaluate flow at rest and with distal augmentation maneuvers in the common femoral, femoral and popliteal veins. COMPARISON:  None. FINDINGS: RIGHT LOWER EXTREMITY Common Femoral Vein: No evidence of thrombus. Normal compressibility, respiratory phasicity and response to augmentation. Saphenofemoral Junction: No evidence of thrombus. Normal compressibility and flow on color Doppler imaging. Profunda Femoral Vein: No evidence of thrombus. Normal compressibility and flow on color Doppler imaging. Femoral Vein: No evidence  of thrombus. Normal compressibility, respiratory phasicity and response to augmentation. Popliteal Vein: No evidence of thrombus. Normal compressibility, respiratory phasicity and response to augmentation. Calf Veins: No evidence of thrombus. Normal compressibility and flow on color Doppler imaging. Superficial Great Saphenous Vein: No evidence of thrombus. Normal compressibility. Venous Reflux:  Not assessed Other Findings:  None. LEFT LOWER EXTREMITY Common Femoral Vein: Positive for hypoechoic thrombus. Vessel is noncompressible. Thrombus partially occlusive. Saphenofemoral Junction: Femoral thrombus extends into the saphenofemoral junction, partially occlusive. Profunda Femoral Vein: No evidence of thrombus. Normal compressibility and flow on color Doppler imaging. Femoral Vein: Hypoechoic thrombus. Vessel is noncompressible. Thrombus appears occlusive throughout the femoral vein. Popliteal Vein: Hypoechoic thrombus. Vessel is also noncompressible. Thrombus appears occlusive. Calf Veins: Thrombus does appear to extend into the tibial and peroneal calf veins inferiorly. Superficial Great Saphenous Vein: No evidence of thrombus. Normal compressibility. Venous Reflux:  Not assessed Other Findings:  None. IMPRESSION: Positive exam for left femoropopliteal DVT extending into the calf veins. Negative for right lower extremity DVT. These results will be called to the ordering clinician or representative by the Radiologist Assistant, and communication documented in the PACS or zVision Dashboard. Electronically Signed   By: Jerilynn Mages.  Shick M.D.   On: 07/16/2019 12:50   Dg Chest Portable 1 View  Result Date: 07/15/2019 CLINICAL DATA:  Shortness of breath. EXAM: PORTABLE CHEST 1 VIEW COMPARISON:  Radiograph 09/02/2018 FINDINGS: Chronic elevation of right hemidiaphragm. Unchanged heart size and mediastinal contours. Aortic atherosclerosis. Streaky bibasilar opacities without confluent airspace disease. No pleural effusion or  pneumothorax. Degenerative change of the left shoulder. IMPRESSION: Streaky bibasilar opacities. Aortic Atherosclerosis (ICD10-I70.0). Electronically Signed   By: Keith Rake M.D.   On: 07/15/2019 20:43     ASSESSMENT AND PLAN:   Bilateral  pulmonary embolism and DVT of left leg:   Mild right heart strain as noted below on echo results, but remains hemodynamically stable and in no acute distress.  Continue heparin and continue to monitor.   Pt states she has had heart fluttering recently intermittently. Advise outpatient work up for possible paroxysmal afib.   Echo Results: 1. LV has normal function with an ejection fraction of 60-65%. The cavity size was normal. Impaired relaxation of LV noted, normal wall motion of LV.  2. The right ventricle has normal systolic function. The cavity was mildly enlarged. There is no increase in right ventricular wall thickness. Right ventricular systolic pressure is mildly elevated.  Jake Bathe, NP-C Cell: [phone]

## 2019-07-17 NOTE — Progress Notes (Deleted)
Pt discharged to home via wc.  Instructions  given to pt.  Questions answered.  No distress.  

## 2019-07-17 NOTE — Progress Notes (Signed)
PT Cancellation Note  Patient Details Name: Erin Sutton MRN: [id number] DOB: [birth year]   Cancelled Treatment:    Reason Eval/Treat Not Completed: Medical issues which prohibited therapy.  Pt is demonstrating a L fem pop DVT and RLL pulm infarct and PE, mod to large.  Will hold PT for now as she begins heparin.  Reassess ability to perform mobility at another time.   Ramond Dial 07/17/2019, 10:12 AM   Mee Hives, PT MS Acute Rehab Dept. Number: [place] and [place]

## 2019-07-17 NOTE — Progress Notes (Signed)
[facility] NAME: Erin Sutton    MR#:  [id number]  DATE OF BIRTH:  1941/07/26  SUBJECTIVE:  CHIEF COMPLAINT:   Chief Complaint  Patient presents with  . Abnormal Lab   The patient has better shortness of breath, no chest pain.  On oxygen by nasal cannula 2 L. REVIEW OF SYSTEMS:  Review of Systems  Constitutional: Positive for malaise/fatigue. Negative for chills and fever.  HENT: Negative for sore throat.   Eyes: Negative for blurred vision and double vision.  Respiratory: Positive for shortness of breath. Negative for cough, hemoptysis, sputum production, wheezing and stridor.   Cardiovascular: Negative for chest pain, palpitations, orthopnea and leg swelling.  Gastrointestinal: Negative for abdominal pain, blood in stool, diarrhea, melena, nausea and vomiting.  Genitourinary: Negative for dysuria, flank pain and hematuria.  Musculoskeletal: Negative for back pain and joint pain.  Skin: Negative for rash.  Neurological: Negative for dizziness, sensory change, focal weakness, seizures, loss of consciousness, weakness and headaches.  Endo/Heme/Allergies: Negative for polydipsia.  Psychiatric/Behavioral: Negative for depression. The patient is not nervous/anxious.     DRUG ALLERGIES:   Allergies  Allergen Reactions  . Ace Inhibitors Cough  . Amoxicillin Rash    Has patient had a PCN reaction causing immediate rash, facial/tongue/throat swelling, SOB or lightheadedness with hypotension: No Has patient had a PCN reaction causing severe rash involving mucus membranes or skin necrosis: No Has patient had a PCN reaction that required hospitalization: No Has patient had a PCN reaction occurring within the last 10 years: No If all of the above answers are "NO", then may proceed with Cephalosporin use.   Marland Kitchen Benicar [Olmesartan] Rash    Itching rash  . Codeine Sulfate Rash  . Levaquin [Levofloxacin In D5w] Rash    Alters  mental status  . Penicillin V Potassium Rash    Has patient had a PCN reaction causing immediate rash, facial/tongue/throat swelling, SOB or lightheadedness with hypotension: No Has patient had a PCN reaction causing severe rash involving mucus membranes or skin necrosis: No Has patient had a PCN reaction that required hospitalization: No Has patient had a PCN reaction occurring within the last 10 years: No If all of the above answers are "NO", then may proceed with Cephalosporin use.   . Sulfa Antibiotics Rash   VITALS:  Blood pressure (!) 151/57, pulse 73, temperature 98.2 F (36.8 C), temperature source Oral, resp. rate 19, height 5' (1.524 m), weight 73.5 kg, SpO2 96 %. PHYSICAL EXAMINATION:  Physical Exam Constitutional:      General: She is not in acute distress. HENT:     Head: Normocephalic.     Mouth/Throat:     Mouth: Mucous membranes are moist.  Eyes:     General: No scleral icterus.    Conjunctiva/sclera: Conjunctivae normal.     Pupils: Pupils are equal, round, and reactive to light.  Neck:     Musculoskeletal: Normal range of motion and neck supple.     Vascular: No JVD.     Trachea: No tracheal deviation.  Cardiovascular:     Rate and Rhythm: Normal rate and regular rhythm.     Heart sounds: Normal heart sounds. No murmur. No gallop.   Pulmonary:     Effort: Pulmonary effort is normal. No respiratory distress.     Breath sounds: Normal breath sounds. No wheezing or rales.  Abdominal:     General: Bowel sounds are normal. There is no distension.  Palpations: Abdomen is soft.     Tenderness: There is no abdominal tenderness. There is no rebound.  Musculoskeletal: Normal range of motion.        General: No tenderness.     Right lower leg: No edema.     Left lower leg: No edema.  Skin:    Findings: No erythema or rash.  Neurological:     General: No focal deficit present.     Mental Status: She is alert and oriented to person, place, and time.     Cranial  Nerves: No cranial nerve deficit.  Psychiatric:        Mood and Affect: Mood normal.    LABORATORY PANEL:  Female CBC Recent Labs  Lab 07/17/19 0707  WBC 10.0  HGB 9.9*  HCT 31.5*  PLT 291   ------------------------------------------------------------------------------------------------------------------ Chemistries  Recent Labs  Lab 07/15/19 2021  NA 135  K 3.9  CL 99  CO2 26  GLUCOSE 121*  BUN 11  CREATININE 0.77  CALCIUM 9.3  AST 33  ALT 13  ALKPHOS 53  BILITOT 0.7   RADIOLOGY:  No results found. ASSESSMENT AND PLAN:   emboli. 1. Bilateral pulmonary emboli and left femoropopliteal DVT:  Venous duplex report left leg DVT.    Echo is unremarkable. The left ventricle has normal systolic function with an ejection fraction of 60-65%. Continue heparin drip and aspirin.   No indication for thrombectomy per vascular surgeon.  Acute respiratory failure with hypoxia due to above.  Try to wean off oxygen. 2. Hypertension: Better controlled.  Resumed Imdur, amlodipine and carvedilol. 3. Hypothyroidism: continue Synthroid 4. Obesity: BMI is 33.2; encourage healthy diet and exercise.  All the records are reviewed and case discussed with Care Management/Social Worker. Management plans discussed with the patient, family and they are in agreement.  CODE STATUS: Full Code  TOTAL TIME TAKING CARE OF THIS PATIENT: 33 minutes.   More than 50% of the time was spent in counseling/coordination of care: YES  POSSIBLE D/C IN 2 DAYS, DEPENDING ON CLINICAL CONDITION.   Demetrios Loll M.D on 07/17/2019 at 1:10 PM  Between 7am to 6pm - Pager - [phone]  After 6pm go to [email or web address] - [facility]

## 2019-07-18 DIAGNOSIS — I2690 Septic pulmonary embolism without acute cor pulmonale: Secondary | ICD-10-CM

## 2019-07-18 LAB — CBC
HCT: 31.7 % — ABNORMAL LOW (ref 36.0–46.0)
Hemoglobin: 10 g/dL — ABNORMAL LOW (ref 12.0–15.0)
MCH: 28.2 pg (ref 26.0–34.0)
MCHC: 31.5 g/dL (ref 30.0–36.0)
MCV: 89.3 fL (ref 80.0–100.0)
Platelets: 329 10*3/uL (ref 150–400)
RBC: 3.55 MIL/uL — ABNORMAL LOW (ref 3.87–5.11)
RDW: 17.2 % — ABNORMAL HIGH (ref 11.5–15.5)
WBC: 10.8 10*3/uL — ABNORMAL HIGH (ref 4.0–10.5)
nRBC: 0 % (ref 0.0–0.2)

## 2019-07-18 LAB — HEPARIN LEVEL (UNFRACTIONATED): Heparin Unfractionated: 0.43 IU/mL (ref 0.30–0.70)

## 2019-07-18 MED ORDER — PANTOPRAZOLE SODIUM 40 MG IV SOLR
40.0000 mg | Freq: Two times a day (BID) | INTRAVENOUS | Status: DC
  Administered 2019-07-18 – 2019-07-20 (×5): 40 mg via INTRAVENOUS
  Filled 2019-07-18 (×5): qty 40

## 2019-07-18 MED ORDER — APIXABAN 5 MG PO TABS
10.0000 mg | ORAL_TABLET | Freq: Two times a day (BID) | ORAL | Status: DC
  Administered 2019-07-18 – 2019-07-20 (×5): 10 mg via ORAL
  Filled 2019-07-18 (×5): qty 2

## 2019-07-18 MED ORDER — APIXABAN 5 MG PO TABS: ORAL_TABLET | ORAL | 1 refills | Status: DC

## 2019-07-18 MED ORDER — APIXABAN 5 MG PO TABS: 5.0000 mg | ORAL_TABLET | Freq: Two times a day (BID) | ORAL | Status: DC

## 2019-07-18 NOTE — Progress Notes (Signed)
[facility] Daily Progress Note    Subjective: Patient without complaint this a.m.  No issues overnight.  Patient denies any shortness of breath, chest pain or left lower extremity discomfort this a.m. however has not been out of bed to ambulate yet.  Objective: Vitals:   07/17/19 2024 07/18/19 0500 07/18/19 0539 07/18/19 0738  BP: (!) 126/59  125/71 131/79  Pulse: 74  80 76  Resp: 20  20 18   Temp: 98.7 F (37.1 C)  98.3 F (36.8 C) 99.6 F (37.6 C)  TempSrc: Oral  Oral Oral  SpO2: 93%  90% 93%  Weight:  78.8 kg    Height:        Intake/Output Summary (Last 24 hours) at 07/18/2019 1053 Last data filed at 07/18/2019 0900 Gross per 24 hour  Intake 627.03 ml  Output 650 ml  Net -22.97 ml   Physical Exam: A&Ox3, NAD CV: RRR Pulmonary: CTA Bilaterally Abdomen: Soft, Nontender, Nondistended Vascular:  Left lower extremity: Thigh soft.  Calf soft.  Extremity is warm distally to the toes.  Good capillary refill to toes.  There is no acute vascular compromise to left lower extremity at this time.  Nontender to palpation no pain with dorsiflexion.   Laboratory: CBC    Component Value Date/Time   WBC 10.8 (H) 07/18/2019 0553   HGB 10.0 (L) 07/18/2019 0553   HGB 11.8 (L) 08/09/2014 0531   HCT 31.7 (L) 07/18/2019 0553   HCT 33.9 (L) 08/08/2014 0703   PLT 329 07/18/2019 0553   PLT 211 08/08/2014 0703   BMET    Component Value Date/Time   NA 135 07/15/2019 2021   NA 138 08/08/2014 0703   K 3.9 07/15/2019 2021   K 3.4 (L) 08/08/2014 0703   CL 99 07/15/2019 2021   CL 102 08/08/2014 0703   CO2 26 07/15/2019 2021   CO2 30 08/08/2014 0703   GLUCOSE 121 (H) 07/15/2019 2021   GLUCOSE 103 (H) 08/08/2014 0703   BUN 11 07/15/2019 2021   BUN 7 08/08/2014 0703   CREATININE 0.77 07/15/2019 2021   CREATININE 0.88 08/08/2014 0703   CALCIUM 9.3 07/15/2019 2021   CALCIUM 8.4 (L) 08/08/2014 0703   GFRNONAA >60 07/15/2019 2021   GFRNONAA >60 08/08/2014 0703   GFRAA >60 07/15/2019 2021   GFRAA >60 08/08/2014 0703   Assessment/Planning: The patient is a 78 year old female who presented with left lower extremity DVT/PE 1) patient is currently asymptomatic denying any chest pain, shortness of breath or left lower extremity discomfort however the patient notes she has not ambulated yet. 2) recommend ambulation.  Patient remains asymptomatic with stable vital signs agree with discharge. 3) On Eliquis 4) we will follow the patient in the outpatient setting for her left lower extremity DVT.  Discussed with Dr. Ellis Parents Audi Conover PA-C 07/18/2019 10:53 AM

## 2019-07-18 NOTE — TOC Initial Note (Signed)
Transition of Care [facility]) - Initial/Assessment Note    Patient Details  Name: Erin Sutton MRN: [id number] Date of Birth: August 03, 1941  Transition of [facility]) CM/SW Contact:    Ross Ludwig, LCSW Phone Number: 07/18/2019, 6:05 PM  Clinical Narrative:                  Patient is a 78 year old female who is alert and oriented x4.  Patient has been at [facility] getting short term rehab.  Patient states she has had a couple of issues with the staff, but overall she has been progressing.  Patient states she is hopeful that she will not have to be at SNF for much longer, CSW reminded her that insurance will continue to pay as long as she is participating with therapy making progress.  CSW also explained to her, that her 20 days do not start over since she was admitted in the hospital, but they will just continue where her days have left off.  Patient expressed understanding, she asked CSW to contact her daughter and update her on situation.  CSW contacted the daughter and she is agreeable with patient returning to SNF to continue with her therapy.  Patient was also informed that she will have to have a new covid test, she expressed understanding.  CSW to continue to follow patient' progress throughout discharge planning.  5:00pm  CSW Received a phone call from Anda Kraft at [employer] they have approved her for 3 days, auth reference number is [id number].  CSW contacted [facility], and they said they can not accept patient tonight due to it being too late to get her meds, but they can accept her tomorrow pending a negative Covid test.  CSW updated patient, her daughter, bedside nurse, and physician.  Expected Discharge Plan: Skilled Nursing Facility Barriers to Discharge: Insurance Authorization   Patient Goals and CMS Choice Patient states their goals for this hospitalization and ongoing recovery are:: To return to SNF to complete her rehab, then return back home with home  health. CMS Medicare.gov Compare Post Acute Care list provided to:: Patient Choice offered to / list presented to : Patient, Adult Children  Expected Discharge Plan and Services Expected Discharge Plan: [place] In-house Referral: Clinical Social Work   Post Acute Care Choice: [place] Living arrangements for the past 2 months: [place], Mobile Home Expected Discharge Date: 07/18/19               DME Arranged: N/A                    Prior Living Arrangements/Services Living arrangements for the past 2 months: [place], Mobile Home Lives with:: Self Patient language and need for interpreter reviewed:: Yes Do you feel safe going back to the place where you live?: Yes      Need for Family Participation in Patient Care: No (Comment) Care giver support system in place?: Yes (comment)   Criminal Activity/Legal Involvement Pertinent to Current Situation/Hospitalization: No - Comment as needed  Activities of Daily Living Home Assistive Devices/Equipment: Cane (specify quad or straight), Eyeglasses, Grab bars in shower ADL Screening (condition at time of admission) Patient's cognitive ability adequate to safely complete daily activities?: Yes Is the patient deaf or have difficulty hearing?: No Does the patient have difficulty seeing, even when wearing glasses/contacts?: No Does the patient have difficulty concentrating, remembering, or making decisions?: Yes Patient able to express need  for assistance with ADLs?: Yes Does the patient have difficulty dressing or bathing?: Yes Independently performs ADLs?: No Communication: Independent Dressing (OT): Needs assistance Is this a change from baseline?: Pre-admission baseline Grooming: Needs assistance Feeding: Independent Bathing: Needs assistance Is this a change from baseline?: Pre-admission baseline Toileting: Needs assistance Is this a change from baseline?:  Pre-admission baseline In/Out Bed: Needs assistance Is this a change from baseline?: Pre-admission baseline Walks in Home: Needs assistance Is this a change from baseline?: Pre-admission baseline Does the patient have difficulty walking or climbing stairs?: Yes Weakness of Legs: Both Weakness of Arms/Hands: Both  Permission Sought/Granted Permission sought to share information with : Family Supports, Chartered certified accountant granted to share information with : Yes, Verbal Permission Granted  Share Information with NAME: [name] Daughter   [phone]  Permission granted to share info w AGENCY: SNF admissions        Emotional Assessment Appearance:: Appears stated age Attitude/Demeanor/Rapport: Engaged   Orientation: : Oriented to Self, Oriented to Place, Oriented to  Time, Oriented to Situation Alcohol / Substance Use: Not Applicable Psych Involvement: No (comment)  Admission diagnosis:  Elevated d-dimer [R79.89] Dyspnea, unspecified type [R06.00] Acute pulmonary embolism with acute cor pulmonale, unspecified pulmonary embolism type ([place]) [I26.09] Patient Active Problem List   Diagnosis Date Noted  . Pulmonary embolism ([place]) 07/16/2019  . Nausea   . Intractable nausea and vomiting 07/02/2019  . Confusion 06/30/2018  . Hypothyroidism 06/30/2018  . HTN (hypertension) 06/30/2018  . GERD (gastroesophageal reflux disease) 06/30/2018  . Anxiety 06/30/2018  . Accelerated hypertension 06/30/2018  . Sepsis ([place]) 12/17/2016   PCP:  Baxter Hire, MD Pharmacy:   [facility], [place] [address] Phone: [phone] Fax: [phone]  [facility] [place], [place] [address] [address] Phone: [phone] Fax: [place], [place] [address] Phone: [phone] Fax: [phone]     Social Determinants of Health (SDOH) Interventions    Readmission Risk Interventions No flowsheet data found.

## 2019-07-18 NOTE — Progress Notes (Signed)
   SUBJECTIVE: Patient is off supplemental oxygen, feeling well. No chest pain or shortness of breath.   Vitals:   07/18/19 0500 07/18/19 0539 07/18/19 0738 07/18/19 1529  BP:  125/71 131/79 126/88  Pulse:  80 76 77  Resp:  20 18 18   Temp:  98.3 F (36.8 C) 99.6 F (37.6 C) 98.5 F (36.9 C)  TempSrc:  Oral Oral Oral  SpO2:  90% 93% 99%  Weight: 78.8 kg     Height:        Intake/Output Summary (Last 24 hours) at 07/18/2019 1725 Last data filed at 07/18/2019 1339 Gross per 24 hour  Intake 537.13 ml  Output 650 ml  Net -112.87 ml    LABS: Basic Metabolic Panel: Recent Labs    07/15/19 2021  NA 135  K 3.9  CL 99  CO2 26  GLUCOSE 121*  BUN 11  CREATININE 0.77  CALCIUM 9.3   Liver Function Tests: Recent Labs    07/15/19 2021  AST 33  ALT 13  ALKPHOS 53  BILITOT 0.7  PROT 6.6  ALBUMIN 3.0*   No results for input(s): LIPASE, AMYLASE in the last 72 hours. CBC: Recent Labs    07/15/19 2021 07/17/19 0707 07/18/19 0553  WBC 10.2 10.0 10.8*  NEUTROABS 7.9*  --   --   HGB 10.9* 9.9* 10.0*  HCT 34.4* 31.5* 31.7*  MCV 88.2 88.0 89.3  PLT 261 291 329   Cardiac Enzymes: No results for input(s): CKTOTAL, CKMB, CKMBINDEX, TROPONINI in the last 72 hours. BNP: Invalid input(s): POCBNP D-Dimer: No results for input(s): DDIMER in the last 72 hours. Hemoglobin A1C: Recent Labs    07/16/19 0526  HGBA1C 5.8*   Fasting Lipid Panel: No results for input(s): CHOL, HDL, LDLCALC, TRIG, CHOLHDL, LDLDIRECT in the last 72 hours. Thyroid Function Tests: Recent Labs    07/16/19 0526  TSH 2.729   Anemia Panel: No results for input(s): VITAMINB12, FOLATE, FERRITIN, TIBC, IRON, RETICCTPCT in the last 72 hours.   PHYSICAL EXAM General: Well developed, well nourished, in no acute distress HEENT:  Normocephalic and atramatic Neck:  No JVD.  Lungs: Clear bilaterally to auscultation and percussion. Heart: HRRR . Normal S1 and S2 without gallops or murmurs.  Abdomen:  Bowel sounds are positive, abdomen soft and non-tender  Msk:  Back normal, normal gait. Normal strength and tone for age. Extremities: No clubbing, cyanosis or edema.   Neuro: Alert and oriented X 3. Psych:  Good affect, responds appropriately  TELEMETRY: NSR 78bpm  ASSESSMENT AND PLAN:  Bilateral pulmonary embolism and DVT of left leg:   Mild right heart strain as noted below on echo results, but remains hemodynamically stable and in no acute distress. Switch to oral anticoagulation and plan. Plan of care discussed with patient.   Pt states she has had heart fluttering recently intermittently. Advise outpatient work up for possible paroxysmal afib.   Echo Results: 1. LV has normal function with an ejection fraction of 60-65%. The cavity size was normal. Impaired relaxation of LV noted, normal wall motion of LV. 2. The right ventricle has normal systolic function. The cavity was mildly enlarged. There is no increase in right ventricular wall thickness. Right ventricular systolic pressure is mildly elevated.  Jake Bathe, NP-C Cell: [phone]       Active Problems:   Pulmonary embolism ([place])    Jake Bathe, NP-C 07/18/2019 5:25 PM Cell: [phone]

## 2019-07-18 NOTE — Progress Notes (Signed)
SATURATION QUALIFICATIONS: (This note is used to comply with regulatory documentation for home oxygen) ° °Patient Saturations on Room Air at Rest = 94% ° °Patient Saturations on Room Air while Ambulating = 90% ° °Patient Saturations on 0 Liters of oxygen while Ambulating = n/a% ° °Please briefly explain why patient needs home oxygen: °

## 2019-07-18 NOTE — Discharge Summary (Signed)
[facility] [place] NAME: Erin Sutton    MR#:  [id number]  DATE OF BIRTH:  1941-05-15  DATE OF ADMISSION:  07/15/2019   ADMITTING PHYSICIAN: Harrie Foreman, MD  DATE OF DISCHARGE:  07/18/2019 PRIMARY CARE PHYSICIAN: Baxter Hire, MD   ADMISSION DIAGNOSIS:  Elevated d-dimer [R79.89] Dyspnea, unspecified type [R06.00] Acute pulmonary embolism with acute cor pulmonale, unspecified pulmonary embolism type ([place]) [I26.09] DISCHARGE DIAGNOSIS:  Active Problems:   Pulmonary embolism ([place])  SECONDARY DIAGNOSIS:   Past Medical History:  Diagnosis Date  . Anxiety   . Arthritis   . Chronic kidney disease   . Constipation   . GERD (gastroesophageal reflux disease)   . Hypertension   . Hypothyroidism   . Osteoporosis   . Sleep apnea   . Urinary incontinence    HOSPITAL COURSE:  1. Bilateral pulmonary emboli and left femoropopliteal DVT:  Venous duplex report left leg DVT.   Echo is unremarkable. The left ventricle has normal systolic function with an ejection fraction of 60-65%. She has been treated with heparin dripand aspirin.  No indication for thrombectomy per vascular surgeon.  Change to p.o. Eliquis.  I discussed with patient about the benefit and side effect of anticoagulation medications.  Acute respiratory failure with hypoxia due to above. Weaned off oxygen. 2. Hypertension: Better controlled.  ResumedImdur, amlodipine and carvedilol. 3. Hypothyroidism: continue Synthroid 4. Obesity: BMI is 33.2; encourage healthy diet and exercise.  DISCHARGE CONDITIONS:   CONSULTS OBTAINED:  Treatment Team:  Algernon Huxley, MD Dionisio David, MD DRUG ALLERGIES:   Allergies  Allergen Reactions  . Ace Inhibitors Cough  . Amoxicillin Rash    Has patient had a PCN reaction causing immediate rash, facial/tongue/throat swelling, SOB or lightheadedness with hypotension: No Has patient had a PCN reaction causing severe  rash involving mucus membranes or skin necrosis: No Has patient had a PCN reaction that required hospitalization: No Has patient had a PCN reaction occurring within the last 10 years: No If all of the above answers are "NO", then may proceed with Cephalosporin use.   Marland Kitchen Benicar [Olmesartan] Rash    Itching rash  . Codeine Sulfate Rash  . Levaquin [Levofloxacin In D5w] Rash    Alters mental status  . Penicillin V Potassium Rash    Has patient had a PCN reaction causing immediate rash, facial/tongue/throat swelling, SOB or lightheadedness with hypotension: No Has patient had a PCN reaction causing severe rash involving mucus membranes or skin necrosis: No Has patient had a PCN reaction that required hospitalization: No Has patient had a PCN reaction occurring within the last 10 years: No If all of the above answers are "NO", then may proceed with Cephalosporin use.   . Sulfa Antibiotics Rash   DISCHARGE MEDICATIONS:   Allergies as of 07/18/2019      Reactions   Ace Inhibitors Cough   Amoxicillin Rash   Has patient had a PCN reaction causing immediate rash, facial/tongue/throat swelling, SOB or lightheadedness with hypotension: No Has patient had a PCN reaction causing severe rash involving mucus membranes or skin necrosis: No Has patient had a PCN reaction that required hospitalization: No Has patient had a PCN reaction occurring within the last 10 years: No If all of the above answers are "NO", then may proceed with Cephalosporin use.   Benicar [olmesartan] Rash   Itching rash   Codeine Sulfate Rash   Levaquin [levofloxacin In D5w] Rash   Alters  mental status   Penicillin V Potassium Rash   Has patient had a PCN reaction causing immediate rash, facial/tongue/throat swelling, SOB or lightheadedness with hypotension: No Has patient had a PCN reaction causing severe rash involving mucus membranes or skin necrosis: No Has patient had a PCN reaction that required hospitalization: No  Has patient had a PCN reaction occurring within the last 10 years: No If all of the above answers are "NO", then may proceed with Cephalosporin use.   Sulfa Antibiotics Rash      Medication List    STOP taking these medications   meloxicam 15 MG tablet Commonly known as: MOBIC     TAKE these medications   amLODipine 10 MG tablet Commonly known as: NORVASC Take 10 mg by mouth daily.   apixaban 5 MG Tabs tablet Commonly known as: ELIQUIS 10 mg po bid for 7 days, then 5 mg po bid.   aspirin EC 81 MG tablet Take 81 mg by mouth daily.   carvedilol 12.5 MG tablet Commonly known as: COREG Take 12.5 mg by mouth 2 (two) times daily.   cholecalciferol 25 MCG (1000 UT) tablet Commonly known as: VITAMIN D3 Take 1,000 Units by mouth daily.   cyanocobalamin 1000 MCG/ML injection Commonly known as: (VITAMIN B-12) Inject 1,000 mcg into the muscle every 30 (thirty) days.   docusate sodium 100 MG capsule Commonly known as: Colace Take 1 capsule (100 mg total) by mouth 2 (two) times daily.   feeding supplement (ENSURE ENLIVE) Liqd Take 237 mLs by mouth 2 (two) times daily between meals.   isosorbide mononitrate 30 MG 24 hr tablet Commonly known as: IMDUR Take 30 mg by mouth daily.   levothyroxine 50 MCG tablet Commonly known as: SYNTHROID Take 50 mcg by mouth daily before breakfast.   LORazepam 2 MG tablet Commonly known as: ATIVAN Take 2 mg by mouth every 8 (eight) hours as needed for anxiety.   magnesium oxide 400 MG tablet Commonly known as: MAG-OX Take 400 mg by mouth daily.   ondansetron 8 MG tablet Commonly known as: ZOFRAN Take 8 mg by mouth every 8 (eight) hours as needed.   pantoprazole 40 MG tablet Commonly known as: PROTONIX Take 40 mg by mouth daily.   potassium chloride SA 20 MEQ tablet Commonly known as: K-DUR Take 20 mEq by mouth every morning.   pravastatin 40 MG tablet Commonly known as: PRAVACHOL Take 40 mg by mouth at bedtime.   sertraline  100 MG tablet Commonly known as: ZOLOFT Take 100 mg by mouth at bedtime.   simethicone 80 MG chewable tablet Commonly known as: MYLICON Chew 1 tablet (80 mg total) by mouth every 6 (six) hours as needed for flatulence.   SYSTANE OP Place 1 drop into both eyes 2 (two) times daily.        DISCHARGE INSTRUCTIONS:  See AVS, If you experience worsening of your admission symptoms, develop shortness of breath, life threatening emergency, suicidal or homicidal thoughts you must seek medical attention immediately by calling 911 or calling your MD immediately  if symptoms less severe.  You Must read complete instructions/literature along with all the possible adverse reactions/side effects for all the Medicines you take and that have been prescribed to you. Take any new Medicines after you have completely understood and accpet all the possible adverse reactions/side effects.   Please note  You were cared for by a hospitalist during your hospital stay. If you have any questions about your discharge medications or the care  you received while you were in the hospital after you are discharged, you can call the unit and asked to speak with the hospitalist on call if the hospitalist that took care of you is not available. Once you are discharged, your primary care physician will handle any further medical issues. Please note that NO REFILLS for any discharge medications will be authorized once you are discharged, as it is imperative that you return to your primary care physician (or establish a relationship with a primary care physician if you do not have one) for your aftercare needs so that they can reassess your need for medications and monitor your lab values.    On the day of Discharge:  VITAL SIGNS:  Blood pressure 131/79, pulse 76, temperature 99.6 F (37.6 C), temperature source Oral, resp. rate 18, height 5' (1.524 m), weight 78.8 kg, SpO2 93 %. PHYSICAL EXAMINATION:  GENERAL:  78  y.o.-year-old patient lying in the bed with no acute distress.  EYES: Pupils equal, round, reactive to light and accommodation. No scleral icterus. Extraocular muscles intact.  HEENT: Head atraumatic, normocephalic. Oropharynx and nasopharynx clear.  NECK:  Supple, no jugular venous distention. No thyroid enlargement, no tenderness.  LUNGS: Normal breath sounds bilaterally, no wheezing, rales,rhonchi or crepitation. No use of accessory muscles of respiration.  CARDIOVASCULAR: S1, S2 normal. No murmurs, rubs, or gallops.  ABDOMEN: Soft, non-tender, non-distended. Bowel sounds present. No organomegaly or mass.  EXTREMITIES: No pedal edema, cyanosis, or clubbing.  NEUROLOGIC: Cranial nerves II through XII are intact. Muscle strength 4/5 in all extremities. Sensation intact. Gait not checked.  PSYCHIATRIC: The patient is alert and oriented x 3.  SKIN: No obvious rash, lesion, or ulcer.  DATA REVIEW:   CBC Recent Labs  Lab 07/18/19 0553  WBC 10.8*  HGB 10.0*  HCT 31.7*  PLT 329    Chemistries  Recent Labs  Lab 07/15/19 2021  NA 135  K 3.9  CL 99  CO2 26  GLUCOSE 121*  BUN 11  CREATININE 0.77  CALCIUM 9.3  AST 33  ALT 13  ALKPHOS 18  BILITOT 0.7     Microbiology Results  Results for orders placed or performed during the hospital encounter of 07/15/19  SARS CORONAVIRUS 2 Nasal Swab Aptima Multi Swab     Status: None   Collection Time: 07/16/19  3:17 AM   Specimen: Aptima Multi Swab; Nasal Swab  Result Value Ref Range Status   SARS Coronavirus 2 NEGATIVE NEGATIVE Final    Comment: (NOTE) SARS-CoV-2 target nucleic acids are NOT DETECTED. The SARS-CoV-2 RNA is generally detectable in upper and lower respiratory specimens during the acute phase of infection. Negative results do not preclude SARS-CoV-2 infection, do not rule out co-infections with other pathogens, and should not be used as the sole basis for treatment or other patient management decisions. Negative results  must be combined with clinical observations, patient history, and epidemiological information. The expected result is Negative. Fact Sheet for Patients: SugarRoll.be Fact Sheet for Healthcare Providers: https://www.woods-mathews.com/ This test is not yet approved or cleared by the [place] FDA and  has been authorized for detection and/or diagnosis of SARS-CoV-2 by FDA under an Emergency Use Authorization (EUA). This EUA will remain  in effect (meaning this test can be used) for the duration of the COVID-19 declaration under Section 56 4(b)(1) of the Act, 21 U.S.C. section 360bbb-3(b)(1), unless the authorization is terminated or revoked sooner. Performed at [facility], [place] [address] [place], [place]  [address]   MRSA PCR Screening     Status: None   Collection Time: 07/16/19  5:09 AM   Specimen: Nasal Mucosa; Nasopharyngeal  Result Value Ref Range Status   MRSA by PCR NEGATIVE NEGATIVE Final    Comment:        The GeneXpert MRSA Assay (FDA approved for NASAL specimens only), is one component of a comprehensive MRSA colonization surveillance program. It is not intended to diagnose MRSA infection nor to guide or monitor treatment for MRSA infections. Performed at [facility], [address] [place], [place] [address]     RADIOLOGY:  No results found.   Management plans discussed with the patient, family and they are in agreement.  CODE STATUS: Full Code   TOTAL TIME TAKING CARE OF THIS PATIENT: 33 minutes.    Demetrios Loll M.D on 07/18/2019 at 9:47 AM  Between 7am to 6pm - Pager - [phone]  After 6pm go to [email or web address] - Proofreader  [facility]  Office  [phone]  CC: Primary care physician; Baxter Hire, MD   Note: This dictation was prepared with Dragon dictation along with smaller phrase technology. Any transcriptional errors that result from this  process are unintentional.

## 2019-07-18 NOTE — Consult Note (Addendum)
ANTICOAGULATION CONSULT NOTE - Initial Consult  Pharmacy Consult for apixaban  Indication: pulmonary embolus  Allergies  Allergen Reactions  . Ace Inhibitors Cough  . Amoxicillin Rash    Has patient had a PCN reaction causing immediate rash, facial/tongue/throat swelling, SOB or lightheadedness with hypotension: No Has patient had a PCN reaction causing severe rash involving mucus membranes or skin necrosis: No Has patient had a PCN reaction that required hospitalization: No Has patient had a PCN reaction occurring within the last 10 years: No If all of the above answers are "NO", then may proceed with Cephalosporin use.   Marland Kitchen Benicar [Olmesartan] Rash    Itching rash  . Codeine Sulfate Rash  . Levaquin [Levofloxacin In D5w] Rash    Alters mental status  . Penicillin V Potassium Rash    Has patient had a PCN reaction causing immediate rash, facial/tongue/throat swelling, SOB or lightheadedness with hypotension: No Has patient had a PCN reaction causing severe rash involving mucus membranes or skin necrosis: No Has patient had a PCN reaction that required hospitalization: No Has patient had a PCN reaction occurring within the last 10 years: No If all of the above answers are "NO", then may proceed with Cephalosporin use.   . Sulfa Antibiotics Rash    Patient Measurements: Height: 5' (152.4 cm) Weight: 173 lb 11.6 oz (78.8 kg) IBW/kg (Calculated) : 45.5  Vital Signs: Temp: 99.6 F (37.6 C) (08/24 0738) Temp Source: Oral (08/24 0738) BP: 131/79 (08/24 0738) Pulse Rate: 76 (08/24 0738)  Labs: Recent Labs    07/15/19 2021 07/16/19 0525  07/16/19 2256 07/17/19 0707 07/18/19 0553  HGB 10.9*  --   --   --  9.9* 10.0*  HCT 34.4*  --   --   --  31.5* 31.7*  PLT 261  --   --   --  291 329  APTT 49*  --   --   --   --   --   LABPROT 15.8*  --   --   --   --   --   INR 1.3*  --   --   --   --   --   HEPARINUNFRC  --   --    < > 0.47 0.62 0.43  CREATININE 0.77  --   --   --    --   --   TROPONINIHS 10 12  --   --   --   --    < > = values in this interval not displayed.    Estimated Creatinine Clearance: 53.8 mL/min (by C-G formula based on SCr of 0.77 mg/dL).   Medical History: Past Medical History:  Diagnosis Date  . Anxiety   . Arthritis   . Chronic kidney disease   . Constipation   . GERD (gastroesophageal reflux disease)   . Hypertension   . Hypothyroidism   . Osteoporosis   . Sleep apnea   . Urinary incontinence     Medications:  Medications Prior to Admission  Medication Sig Dispense Refill Last Dose  . amLODipine (NORVASC) 10 MG tablet Take 10 mg by mouth daily.   07/15/2019 at 0700  . aspirin EC 81 MG tablet Take 81 mg by mouth daily.   07/15/2019 at 0800  . carvedilol (COREG) 12.5 MG tablet Take 12.5 mg by mouth 2 (two) times daily.   07/15/2019 at 1830  . cholecalciferol (VITAMIN D3) 25 MCG (1000 UT) tablet Take 1,000 Units by mouth daily.  07/15/2019 at 0800  . cyanocobalamin (,VITAMIN B-12,) 1000 MCG/ML injection Inject 1,000 mcg into the muscle every 30 (thirty) days.   07/04/2019 at Unknown time  . docusate sodium (COLACE) 100 MG capsule Take 1 capsule (100 mg total) by mouth 2 (two) times daily. 60 capsule 2 Past Month at Unknown time  . isosorbide mononitrate (IMDUR) 30 MG 24 hr tablet Take 30 mg by mouth daily.   07/15/2019 at 1830  . levothyroxine (SYNTHROID, LEVOTHROID) 50 MCG tablet Take 50 mcg by mouth daily before breakfast.   07/15/2019 at 0600  . LORazepam (ATIVAN) 2 MG tablet Take 2 mg by mouth every 8 (eight) hours as needed for anxiety.    prn at prn  . meloxicam (MOBIC) 15 MG tablet Take 15 mg by mouth daily as needed.   prn at prn  . ondansetron (ZOFRAN) 8 MG tablet Take 8 mg by mouth every 8 (eight) hours as needed.   prn at prn  . pantoprazole (PROTONIX) 40 MG tablet Take 40 mg by mouth daily.   07/15/2019 at 0700  . Polyethyl Glycol-Propyl Glycol (SYSTANE OP) Place 1 drop into both eyes 2 (two) times daily.   07/15/2019 at  1830  . potassium chloride SA (K-DUR,KLOR-CON) 20 MEQ tablet Take 20 mEq by mouth every morning.   07/15/2019 at 1100  . pravastatin (PRAVACHOL) 40 MG tablet Take 40 mg by mouth at bedtime.    07/15/2019 at 2230  . sertraline (ZOLOFT) 100 MG tablet Take 100 mg by mouth at bedtime.    07/15/2019 at 2230  . feeding supplement, ENSURE ENLIVE, (ENSURE ENLIVE) LIQD Take 237 mLs by mouth 2 (two) times daily between meals. 237 mL 12   . magnesium oxide (MAG-OX) 400 MG tablet Take 400 mg by mouth daily.   Not Taking at Unknown time  . simethicone (MYLICON) 80 MG chewable tablet Chew 1 tablet (80 mg total) by mouth every 6 (six) hours as needed for flatulence. (Patient not taking: Reported on 07/16/2019) 30 tablet 0 Not Taking at Unknown time   Scheduled:  . amLODipine  10 mg Oral Daily  . aspirin EC  81 mg Oral Daily  . carvedilol  12.5 mg Oral BID WC  . cholecalciferol  1,000 Units Oral Daily  . [START ON 08/04/2019] cyanocobalamin  1,000 mcg Intramuscular Q30 days  . docusate sodium  100 mg Oral BID  . feeding supplement (ENSURE ENLIVE)  237 mL Oral BID BM  . isosorbide mononitrate  30 mg Oral Daily  . levothyroxine  50 mcg Oral QAC breakfast  . pantoprazole (PROTONIX) IV  40 mg Intravenous Q12H  . polyvinyl alcohol   Both Eyes BID  . potassium chloride SA  20 mEq Oral q morning - 10a  . pravastatin  40 mg Oral QHS  . sertraline  100 mg Oral QHS  . sodium chloride flush  3 mL Intravenous Q12H   Infusions:  . heparin 1,200 Units/hr (07/18/19 0551)   PRN: acetaminophen **OR** acetaminophen, LORazepam, ondansetron **OR** ondansetron (ZOFRAN) IV  Assessment: Pt has been on heparin for a PE. Switching to apixaban for a plan to discharge today.   Goal of Therapy:  Monitor platelets by anticoagulation protocol: Yes   Plan:  Apixaban 10 mg BID for 7 days followed by 5 mg BID.   Oswald Hillock, PharmD, BCPS 07/18/2019,9:02 AM

## 2019-07-18 NOTE — NC FL2 (Signed)
[place] LEVEL OF CARE SCREENING TOOL     IDENTIFICATION  Patient Name: Erin Sutton Birthdate: 1941-11-21 Sex: female Admission Date (Current Location): 07/15/2019  [place] and [place] Number:  Engineering geologist and Address:  [facility], [address], [place], [place] [address]      Provider Number: [id number]  Attending Physician Name and Address:  Demetrios Loll, MD  Relative Name and Phone Number:  [name] Daughter   [phone]    Current Level of Care: Hospital Recommended Level of Care: [place] Prior Approval Number:    Date Approved/Denied:   PASRR Number: [id number]  Discharge Plan: SNF    Current Diagnoses: Patient Active Problem List   Diagnosis Date Noted  . Pulmonary embolism ([place]) 07/16/2019  . Nausea   . Intractable nausea and vomiting 07/02/2019  . Confusion 06/30/2018  . Hypothyroidism 06/30/2018  . HTN (hypertension) 06/30/2018  . GERD (gastroesophageal reflux disease) 06/30/2018  . Anxiety 06/30/2018  . Accelerated hypertension 06/30/2018  . Sepsis ([place]) 12/17/2016    Orientation RESPIRATION BLADDER Height & Weight     Self, Time, Situation, Place  Normal Incontinent Weight: 173 lb 11.6 oz (78.8 kg) Height:  5' (152.4 cm)  BEHAVIORAL SYMPTOMS/MOOD NEUROLOGICAL BOWEL NUTRITION STATUS      Continent Diet(Cardiac)  AMBULATORY STATUS COMMUNICATION OF NEEDS Skin   Limited Assist Verbally Normal                       Personal Care Assistance Level of Assistance  Bathing, Feeding, Dressing Bathing Assistance: Limited assistance Feeding assistance: Independent Dressing Assistance: Limited assistance     Functional Limitations Info  Sight, Speech, Hearing Sight Info: Adequate Hearing Info: Adequate Speech Info: Adequate    SPECIAL CARE FACTORS FREQUENCY  PT (By licensed PT), OT (By licensed OT)     PT Frequency: Minimum 5x a week OT Frequency: Minimum 5x a week            Contractures Contractures Info: Not present    Additional Factors Info  Code Status, Psychotropic, Allergies Code Status Info: Full Code Allergies Info: Ace Inhibitors Amoxicillin Benicar Codeine Sulfate Levaquin Penicillin V Potassium Sulfa Antibiotics Psychotropic Info: sertraline (ZOLOFT) tablet 100 mg         Current Medications (07/18/2019):  This is the current hospital active medication list Current Facility-Administered Medications  Medication Dose Route Frequency Provider Last Rate Last Dose  . acetaminophen (TYLENOL) tablet 650 mg  650 mg Oral Q6H PRN Harrie Foreman, MD   650 mg at 07/16/19 2246   Or  . acetaminophen (TYLENOL) suppository 650 mg  650 mg Rectal Q6H PRN Harrie Foreman, MD      . amLODipine (NORVASC) tablet 10 mg  10 mg Oral Daily Harrie Foreman, MD   10 mg at 07/18/19 1007  . apixaban (ELIQUIS) tablet 10 mg  10 mg Oral BID Demetrios Loll, MD   10 mg at 07/18/19 1008   Followed by  . [START ON 07/25/2019] apixaban (ELIQUIS) tablet 5 mg  5 mg Oral BID Demetrios Loll, MD      . aspirin EC tablet 81 mg  81 mg Oral Daily Harrie Foreman, MD   81 mg at 07/18/19 1007  . carvedilol (COREG) tablet 12.5 mg  12.5 mg Oral BID WC Harrie Foreman, MD   12.5 mg at 07/18/19 1007  . cholecalciferol (VITAMIN D3) tablet 1,000 Units  1,000 Units Oral Daily  Harrie Foreman, MD   1,000 Units at 07/18/19 1007  . [START ON 08/04/2019] cyanocobalamin ((VITAMIN B-12)) injection 1,000 mcg  1,000 mcg Intramuscular Q30 days Harrie Foreman, MD      . docusate sodium (COLACE) capsule 100 mg  100 mg Oral BID Harrie Foreman, MD   100 mg at 07/18/19 1007  . feeding supplement (ENSURE ENLIVE) (ENSURE ENLIVE) liquid 237 mL  237 mL Oral BID BM Harrie Foreman, MD      . isosorbide mononitrate (IMDUR) 24 hr tablet 30 mg  30 mg Oral Daily Harrie Foreman, MD   30 mg at 07/18/19 1007  . levothyroxine (SYNTHROID) tablet 50 mcg  50 mcg Oral QAC breakfast Harrie Foreman, MD   50 mcg at 07/18/19 0542  . LORazepam (ATIVAN) tablet 2 mg  2 mg Oral Q8H PRN Harrie Foreman, MD   2 mg at 07/17/19 2059  . ondansetron (ZOFRAN) tablet 4 mg  4 mg Oral Q6H PRN Harrie Foreman, MD       Or  . ondansetron [facility]) injection 4 mg  4 mg Intravenous Q6H PRN Harrie Foreman, MD      . pantoprazole (PROTONIX) injection 40 mg  40 mg Intravenous Q12H Gladstone Lighter, MD   40 mg at 07/18/19 1008  . polyvinyl alcohol (LIQUIFILM TEARS) 1.4 % ophthalmic solution   Both Eyes BID Harrie Foreman, MD      . potassium chloride SA (K-DUR) CR tablet 20 mEq  20 mEq Oral q morning - 10a Harrie Foreman, MD   20 mEq at 07/18/19 1007  . pravastatin (PRAVACHOL) tablet 40 mg  40 mg Oral QHS Harrie Foreman, MD   40 mg at 07/17/19 2100  . sertraline (ZOLOFT) tablet 100 mg  100 mg Oral QHS Harrie Foreman, MD   100 mg at 07/17/19 2100  . sodium chloride flush (NS) 0.9 % injection 3 mL  3 mL Intravenous Q12H Demetrios Loll, MD   3 mL at 07/17/19 2100     Discharge Medications: Please see discharge summary for a list of discharge medications.  Relevant Imaging Results:  Relevant Lab Results:   Additional Information SSN [id number]  Ross Ludwig, LCSW

## 2019-07-18 NOTE — Evaluation (Signed)
Physical Therapy Evaluation Patient Details Name: Erin Sutton MRN: [id number] DOB: Jan 20, 1941 Today's Date: 07/18/2019   History of Present Illness  From MD H&P: The patient is a 78 yo female with past medical history of hypertension, hypothyroidism and sleep apnea presents to the emergency department complaining of shortness of breath.  The patient's dyspnea was accompanied by pleuritic chest pain that began earlier today.  Upon arrival to the emergency department the patient was mildly tachypneic but had no hypoxia or tachycardia.  However, she did have atypical chest pain and a elevated d-dimer which prompted CTA of the chest that revealed large bilateral PEs with evidence of right heart strain.  The patient was started on heparin drip prior to the emergency department staff calling the hospitalist service for admission.  MD assessment includes: Bilateral pulmonary emboli and left femoropopliteal DVT, Acute respiratory failure with hypoxia, HTN, hypothyroidism, and obesity.    Clinical Impression  Pt presents with deficits in strength, transfers, mobility, gait, balance, and activity tolerance.  Pt required extra time and effort and the use of the bed rail with bed mobility tasks.  Pt required the EOB to be elevated in order to stand without physical assistance and even then required extra time and effort to come to full upright standing.  Pt was able to amb a maximum of 10' with a RW with very slow, cautious steps with short B step length but was steady without buckling or LOB.  Pt's SpO2 on room air at rest was 93% with HR 81 bpm.  After amb SpO2 was 90% and HR 85 bpm with no adverse symptoms other than fatigue.   Pt's SpO2 increased back to the low 90s in <15 sec upon returning to sitting.  Pt will benefit from PT services in a SNF setting upon discharge to safely address above deficits for decreased caregiver assistance and eventual return to PLOF.      Follow Up Recommendations SNF     Equipment Recommendations  None recommended by PT    Recommendations for Other Services       Precautions / Restrictions Precautions Precautions: Fall Precaution Comments: B PEs, watch HR/SpO2 Restrictions Weight Bearing Restrictions: No      Mobility  Bed Mobility Overal bed mobility: Modified Independent             General bed mobility comments: Extra time and effort and the bed rail required during sup to/from sit but no physical assistance needed  Transfers Overall transfer level: Needs assistance Equipment used: Rolling walker (2 wheeled) Transfers: Sit to/from Stand Sit to Stand: Min guard;From elevated surface         General transfer comment: Increased effort to stand from an elevated EOB  Ambulation/Gait Ambulation/Gait assistance: Min guard Gait Distance (Feet): 10 Feet Assistive device: Rolling walker (2 wheeled) Gait Pattern/deviations: Step-through pattern;Decreased step length - right;Decreased step length - left Gait velocity: decreased   General Gait Details: Very slow cadence with short B step length but steady without LOB  Stairs            Wheelchair Mobility    Modified Rankin (Stroke Patients Only)       Balance Overall balance assessment: Needs assistance Sitting-balance support: Bilateral upper extremity supported;Feet supported Sitting balance-Leahy Scale: Good     Standing balance support: Bilateral upper extremity supported Standing balance-Leahy Scale: Fair Standing balance comment: Requires use of RW.  Pertinent Vitals/Pain Pain Assessment: No/denies pain    Home Living Family/patient expects to be discharged to:: Private residence Living Arrangements: Alone Available Help at Discharge: Friend(s);Available PRN/intermittently Type of Home: Mobile home Home Access: Stairs to enter Entrance Stairs-Rails: Right;Left;Can reach both Entrance Stairs-Number of Steps: 3 Home  Layout: One level Home Equipment: Walker - 2 wheels;Cane - single point;Toilet riser;Bedside commode      Prior Function Level of Independence: Independent with assistive device(s)         Comments: Mod Ind amb with a SPC in the community and no AD in the home, Ind with ADLs     Hand Dominance        Extremity/Trunk Assessment   Upper Extremity Assessment Upper Extremity Assessment: Generalized weakness    Lower Extremity Assessment Lower Extremity Assessment: Generalized weakness       Communication   Communication: No difficulties  Cognition Arousal/Alertness: Awake/alert Behavior During Therapy: WFL for tasks assessed/performed Overall Cognitive Status: Within Functional Limits for tasks assessed                                        General Comments      Exercises Total Joint Exercises Ankle Circles/Pumps: Strengthening;Both;5 reps;10 reps Quad Sets: Strengthening;Both;5 reps;10 reps Gluteal Sets: Strengthening;Both;5 reps;10 reps Heel Slides: AROM;Both;5 reps Hip ABduction/ADduction: Strengthening;10 reps;Both Straight Leg Raises: Strengthening;Both;5 reps Long Arc Quad: Strengthening;Both;10 reps Knee Flexion: Strengthening;Both;10 reps Marching in Standing: AROM;Both;5 reps;Standing Other Exercises Other Exercises: HEP education for BLE APs, QS, GS, and LAQs x 10 each 5-6x/day   Assessment/Plan    PT Assessment Patient needs continued PT services  PT Problem List Decreased strength;Decreased mobility;Decreased activity tolerance;Decreased balance;Decreased knowledge of use of DME       PT Treatment Interventions DME instruction;Therapeutic activities;Gait training;Therapeutic exercise;Patient/family education;Stair training;Balance training;Functional mobility training    PT Goals (Current goals can be found in the Care Plan section)  Acute Rehab PT Goals Patient Stated Goal: To continue to get stronger PT Goal Formulation: With  patient Time For Goal Achievement: 07/31/19 Potential to Achieve Goals: Good    Frequency Min 2X/week   Barriers to discharge        Co-evaluation               AM-PAC PT "6 Clicks" Mobility  Outcome Measure Help needed turning from your back to your side while in a flat bed without using bedrails?: A Little Help needed moving from lying on your back to sitting on the side of a flat bed without using bedrails?: A Little Help needed moving to and from a bed to a chair (including a wheelchair)?: A Little Help needed standing up from a chair using your arms (e.g., wheelchair or bedside chair)?: A Little Help needed to walk in hospital room?: A Lot Help needed climbing 3-5 steps with a railing? : Total 6 Click Score: 15    End of Session Equipment Utilized During Treatment: Gait belt Activity Tolerance: Patient tolerated treatment well Patient left: in chair;with chair alarm set;with call bell/phone within reach Nurse Communication: Mobility status;Other (comment)(SpO2 results with activity) PT Visit Diagnosis: Muscle weakness (generalized) (M62.81);Difficulty in walking, not elsewhere classified (R26.2)    Time: 1017-1050 PT Time Calculation (min) (ACUTE ONLY): 33 min   Charges:   PT Evaluation $PT Eval Moderate Complexity: 1 Mod PT Treatments $Therapeutic Exercise: 8-22 mins  Linus Salmons PT, DPT 07/18/19, 12:12 PM

## 2019-07-18 NOTE — Progress Notes (Signed)
[place] [facility] [place] NAME: Erin Sutton    MR#:  [id number]  DATE OF BIRTH:  10/15/1941  SUBJECTIVE:  CHIEF COMPLAINT:   Chief Complaint  Patient presents with  . Abnormal Lab   The patient has no complain.  Off oxygen.Marland Kitchen REVIEW OF SYSTEMS:  Review of Systems  Constitutional: Positive for malaise/fatigue. Negative for chills and fever.  HENT: Negative for sore throat.   Eyes: Negative for blurred vision and double vision.  Respiratory: Negative for cough, hemoptysis, sputum production, shortness of breath, wheezing and stridor.   Cardiovascular: Negative for chest pain, palpitations, orthopnea and leg swelling.  Gastrointestinal: Negative for abdominal pain, blood in stool, diarrhea, melena, nausea and vomiting.  Genitourinary: Negative for dysuria, flank pain and hematuria.  Musculoskeletal: Negative for back pain and joint pain.  Skin: Negative for rash.  Neurological: Negative for dizziness, sensory change, focal weakness, seizures, loss of consciousness, weakness and headaches.  Endo/Heme/Allergies: Negative for polydipsia.  Psychiatric/Behavioral: Negative for depression. The patient is not nervous/anxious.     DRUG ALLERGIES:   Allergies  Allergen Reactions  . Ace Inhibitors Cough  . Amoxicillin Rash    Has patient had a PCN reaction causing immediate rash, facial/tongue/throat swelling, SOB or lightheadedness with hypotension: No Has patient had a PCN reaction causing severe rash involving mucus membranes or skin necrosis: No Has patient had a PCN reaction that required hospitalization: No Has patient had a PCN reaction occurring within the last 10 years: No If all of the above answers are "NO", then may proceed with Cephalosporin use.   Marland Kitchen Benicar [Olmesartan] Rash    Itching rash  . Codeine Sulfate Rash  . Levaquin [Levofloxacin In D5w] Rash    Alters mental status  . Penicillin V Potassium Rash    Has patient had a  PCN reaction causing immediate rash, facial/tongue/throat swelling, SOB or lightheadedness with hypotension: No Has patient had a PCN reaction causing severe rash involving mucus membranes or skin necrosis: No Has patient had a PCN reaction that required hospitalization: No Has patient had a PCN reaction occurring within the last 10 years: No If all of the above answers are "NO", then may proceed with Cephalosporin use.   . Sulfa Antibiotics Rash   VITALS:  Blood pressure 126/88, pulse 77, temperature 98.5 F (36.9 C), temperature source Oral, resp. rate 18, height 5' (1.524 m), weight 78.8 kg, SpO2 99 %. PHYSICAL EXAMINATION:  Physical Exam Constitutional:      General: She is not in acute distress. HENT:     Head: Normocephalic.     Mouth/Throat:     Mouth: Mucous membranes are moist.  Eyes:     General: No scleral icterus.    Conjunctiva/sclera: Conjunctivae normal.     Pupils: Pupils are equal, round, and reactive to light.  Neck:     Musculoskeletal: Normal range of motion and neck supple.     Vascular: No JVD.     Trachea: No tracheal deviation.  Cardiovascular:     Rate and Rhythm: Normal rate and regular rhythm.     Heart sounds: Normal heart sounds. No murmur. No gallop.   Pulmonary:     Effort: Pulmonary effort is normal. No respiratory distress.     Breath sounds: Normal breath sounds. No wheezing or rales.  Abdominal:     General: Bowel sounds are normal. There is no distension.     Palpations: Abdomen is soft.     Tenderness:  There is no abdominal tenderness. There is no rebound.  Musculoskeletal: Normal range of motion.        General: No tenderness.     Right lower leg: No edema.     Left lower leg: No edema.  Skin:    Findings: No erythema or rash.  Neurological:     General: No focal deficit present.     Mental Status: She is alert and oriented to person, place, and time.     Cranial Nerves: No cranial nerve deficit.  Psychiatric:        Mood and  Affect: Mood normal.    LABORATORY PANEL:  Female CBC Recent Labs  Lab 07/18/19 0553  WBC 10.8*  HGB 10.0*  HCT 31.7*  PLT 329   ------------------------------------------------------------------------------------------------------------------ Chemistries  Recent Labs  Lab 07/15/19 2021  NA 135  K 3.9  CL 99  CO2 26  GLUCOSE 121*  BUN 11  CREATININE 0.77  CALCIUM 9.3  AST 33  ALT 13  ALKPHOS 53  BILITOT 0.7   RADIOLOGY:  No results found. ASSESSMENT AND PLAN:   1. Bilateral pulmonary emboliand left femoropoplitealDVT:  Venous duplex report left leg DVT.Echo is unremarkable. The left ventricle has normal systolic function with an ejection fraction of 60-65%. She has been treated with heparin dripand aspirin.No indication for thrombectomy per vascular surgeon.  Changed to p.o. Eliquis.  I discussed with patient about the benefit and side effect of anticoagulation medications.  Acute respiratory failure with hypoxia due to above. Weaned off oxygen. 2. Hypertension:Better controlled.ResumedImdur, amlodipine and carvedilol. 3. Hypothyroidism: continue Synthroid 4. Obesity: BMI is 33.2; encourage healthy diet and exercise.  All the records are reviewed and case discussed with Care Management/Social Worker. Management plans discussed with the patient, family and they are in agreement.  CODE STATUS: Full Code  TOTAL TIME TAKING CARE OF THIS PATIENT: 26 minutes.   More than 50% of the time was spent in counseling/coordination of care: YES  POSSIBLE D/C IN 1 DAYS, DEPENDING ON CLINICAL CONDITION.   Demetrios Loll M.D on 07/18/2019 at 5:25 PM  Between 7am to 6pm - Pager - [phone]  After 6pm go to [email or web address] - [facility]

## 2019-07-18 NOTE — Clinical Social Work Note (Signed)
Patient will need updated insurance authorization before she is able to return to SNF.  CSW faxed required clinical information to [facility], awaiting insurance to approve patient to return to SNF.  Jones Broom. Cambri Plourde, MSW, [place]  07/18/2019 1:26 PM

## 2019-07-18 NOTE — Progress Notes (Signed)
ANTICOAGULATION CONSULT NOTE -  Pharmacy Consult for Heparin Indication: pulmonary embolus  Allergies  Allergen Reactions  . Ace Inhibitors Cough  . Amoxicillin Rash    Has patient had a PCN reaction causing immediate rash, facial/tongue/throat swelling, SOB or lightheadedness with hypotension: No Has patient had a PCN reaction causing severe rash involving mucus membranes or skin necrosis: No Has patient had a PCN reaction that required hospitalization: No Has patient had a PCN reaction occurring within the last 10 years: No If all of the above answers are "NO", then may proceed with Cephalosporin use.   Marland Kitchen Benicar [Olmesartan] Rash    Itching rash  . Codeine Sulfate Rash  . Levaquin [Levofloxacin In D5w] Rash    Alters mental status  . Penicillin V Potassium Rash    Has patient had a PCN reaction causing immediate rash, facial/tongue/throat swelling, SOB or lightheadedness with hypotension: No Has patient had a PCN reaction causing severe rash involving mucus membranes or skin necrosis: No Has patient had a PCN reaction that required hospitalization: No Has patient had a PCN reaction occurring within the last 10 years: No If all of the above answers are "NO", then may proceed with Cephalosporin use.   . Sulfa Antibiotics Rash   Patient Measurements: Height: 5' (152.4 cm) Weight: 162 lb (73.5 kg) IBW/kg (Calculated) : 45.5 HEPARIN DW (KG): 62.5  Vital Signs: Temp: 98.3 F (36.8 C) (08/24 0539) Temp Source: Oral (08/24 0539) BP: 125/71 (08/24 0539) Pulse Rate: 80 (08/24 0539)  Labs: Recent Labs    07/15/19 2021 07/16/19 0525  07/16/19 2256 07/17/19 0707 07/18/19 0553  HGB 10.9*  --   --   --  9.9* 10.0*  HCT 34.4*  --   --   --  31.5* 31.7*  PLT 261  --   --   --  291 329  APTT 49*  --   --   --   --   --   LABPROT 15.8*  --   --   --   --   --   INR 1.3*  --   --   --   --   --   HEPARINUNFRC  --   --    < > 0.47 0.62 0.43  CREATININE 0.77  --   --   --   --    --   TROPONINIHS 10 12  --   --   --   --    < > = values in this interval not displayed.   Estimated Creatinine Clearance: 51.9 mL/min (by C-G formula based on SCr of 0.77 mg/dL).  Medical History: Past Medical History:  Diagnosis Date  . Anxiety   . Arthritis   . Chronic kidney disease   . Constipation   . GERD (gastroesophageal reflux disease)   . Hypertension   . Hypothyroidism   . Osteoporosis   . Sleep apnea   . Urinary incontinence    Medications:  Medications Prior to Admission  Medication Sig Dispense Refill Last Dose  . amLODipine (NORVASC) 10 MG tablet Take 10 mg by mouth daily.   07/15/2019 at 0700  . aspirin EC 81 MG tablet Take 81 mg by mouth daily.   07/15/2019 at 0800  . carvedilol (COREG) 12.5 MG tablet Take 12.5 mg by mouth 2 (two) times daily.   07/15/2019 at 1830  . cholecalciferol (VITAMIN D3) 25 MCG (1000 UT) tablet Take 1,000 Units by mouth daily.   07/15/2019 at 0800  .  cyanocobalamin (,VITAMIN B-12,) 1000 MCG/ML injection Inject 1,000 mcg into the muscle every 30 (thirty) days.   07/04/2019 at Unknown time  . docusate sodium (COLACE) 100 MG capsule Take 1 capsule (100 mg total) by mouth 2 (two) times daily. 60 capsule 2 Past Month at Unknown time  . isosorbide mononitrate (IMDUR) 30 MG 24 hr tablet Take 30 mg by mouth daily.   07/15/2019 at 1830  . levothyroxine (SYNTHROID, LEVOTHROID) 50 MCG tablet Take 50 mcg by mouth daily before breakfast.   07/15/2019 at 0600  . LORazepam (ATIVAN) 2 MG tablet Take 2 mg by mouth every 8 (eight) hours as needed for anxiety.    prn at prn  . meloxicam (MOBIC) 15 MG tablet Take 15 mg by mouth daily as needed.   prn at prn  . ondansetron (ZOFRAN) 8 MG tablet Take 8 mg by mouth every 8 (eight) hours as needed.   prn at prn  . pantoprazole (PROTONIX) 40 MG tablet Take 40 mg by mouth daily.   07/15/2019 at 0700  . Polyethyl Glycol-Propyl Glycol (SYSTANE OP) Place 1 drop into both eyes 2 (two) times daily.   07/15/2019 at 1830  .  potassium chloride SA (K-DUR,KLOR-CON) 20 MEQ tablet Take 20 mEq by mouth every morning.   07/15/2019 at 1100  . pravastatin (PRAVACHOL) 40 MG tablet Take 40 mg by mouth at bedtime.    07/15/2019 at 2230  . sertraline (ZOLOFT) 100 MG tablet Take 100 mg by mouth at bedtime.    07/15/2019 at 2230  . feeding supplement, ENSURE ENLIVE, (ENSURE ENLIVE) LIQD Take 237 mLs by mouth 2 (two) times daily between meals. 237 mL 12   . magnesium oxide (MAG-OX) 400 MG tablet Take 400 mg by mouth daily.   Not Taking at Unknown time  . simethicone (MYLICON) 80 MG chewable tablet Chew 1 tablet (80 mg total) by mouth every 6 (six) hours as needed for flatulence. (Patient not taking: Reported on 07/16/2019) 30 tablet 0 Not Taking at Unknown time   Assessment: Erin Sutton is a 78 y.o. female who reports onset of shortness of breath and some pleuritic chest pain in the right side of the chest this morning.  This is all gone now although she is little but she still short of breath.  Her d-dimer was 2000.  She was sent here for further evaluation.  Baseline coag labs are elevated, aPTT 49, INR 1.3, PT 15.8.  H/H low but PLTs OK.  PTA med list does not indicate any use of anticoagulants.  8/22 ~ 13:00 HL < 0.10 8/22 @ 2256 HL = 0.47  8/23 @ 0707 HL = 0.62 8/24 @ 0553 HL = 0.43, therapeutic x 3  Goal of Therapy:  Heparin level 0.3-0.7 units/ml Monitor platelets by anticoagulation protocol: Yes   Plan:  Continue Heparin at current rate (1200 units/hr) Check HL & CBC with am labs.  Monitor HL, CBC, s/sx bleeding complications per protocol  [name], [place] Delong A, RPH 07/18/2019,6:36 AM

## 2019-07-19 ENCOUNTER — Encounter: Payer: Self-pay | Admitting: *Deleted

## 2019-07-19 LAB — GLUCOSE, CAPILLARY: Glucose-Capillary: 96 mg/dL (ref 70–99)

## 2019-07-19 LAB — CBC
HCT: 29.9 % — ABNORMAL LOW (ref 36.0–46.0)
Hemoglobin: 9.4 g/dL — ABNORMAL LOW (ref 12.0–15.0)
MCH: 27.6 pg (ref 26.0–34.0)
MCHC: 31.4 g/dL (ref 30.0–36.0)
MCV: 87.7 fL (ref 80.0–100.0)
Platelets: 352 10*3/uL (ref 150–400)
RBC: 3.41 MIL/uL — ABNORMAL LOW (ref 3.87–5.11)
RDW: 17.3 % — ABNORMAL HIGH (ref 11.5–15.5)
WBC: 9.8 10*3/uL (ref 4.0–10.5)
nRBC: 0 % (ref 0.0–0.2)

## 2019-07-19 NOTE — TOC Progression Note (Signed)
Transition of Care [facility]) - Progression Note    Patient Details  Name: Erin Sutton MRN: [id number] Date of Birth: 1941-08-04  Transition of Care [facility]) CM/SW Contact  Ross Ludwig, [place] Phone Number: 07/19/2019, 5:34 PM  Clinical Narrative:     CSW spoke to [facility], they can not accept patient until Covid results come back.  [facility] can take patient tomorrow if results from Covid screen come back.    Expected Discharge Plan: [place] Barriers to Discharge: Insurance Authorization  Expected Discharge Plan and Services Expected Discharge Plan: [place] In-house Referral: Clinical Social Work   Post Acute Care Choice: [place] Living arrangements for the past 2 months: [place], Mobile Home Expected Discharge Date: 07/19/19               DME Arranged: N/A                     Social Determinants of Health (SDOH) Interventions    Readmission Risk Interventions No flowsheet data found.

## 2019-07-19 NOTE — Discharge Instructions (Signed)
Heart healthy diet

## 2019-07-19 NOTE — Progress Notes (Addendum)
[facility] [place] NAME: Erin Sutton    MR#:  [id number]  DATE OF BIRTH:  05/02/1941  SUBJECTIVE:  CHIEF COMPLAINT:   Chief Complaint  Patient presents with  . Abnormal Lab   The patient has no complain.  Off oxygen.Marland Kitchen REVIEW OF SYSTEMS:  Review of Systems  Constitutional: Positive for malaise/fatigue. Negative for chills and fever.  HENT: Negative for sore throat.   Eyes: Negative for blurred vision and double vision.  Respiratory: Negative for cough, hemoptysis, sputum production, shortness of breath, wheezing and stridor.   Cardiovascular: Negative for chest pain, palpitations, orthopnea and leg swelling.  Gastrointestinal: Negative for abdominal pain, blood in stool, diarrhea, melena, nausea and vomiting.  Genitourinary: Negative for dysuria, flank pain and hematuria.  Musculoskeletal: Negative for back pain and joint pain.  Skin: Negative for rash.  Neurological: Negative for dizziness, sensory change, focal weakness, seizures, loss of consciousness, weakness and headaches.  Endo/Heme/Allergies: Negative for polydipsia.  Psychiatric/Behavioral: Negative for depression. The patient is not nervous/anxious.     DRUG ALLERGIES:   Allergies  Allergen Reactions  . Ace Inhibitors Cough  . Amoxicillin Rash    Has patient had a PCN reaction causing immediate rash, facial/tongue/throat swelling, SOB or lightheadedness with hypotension: No Has patient had a PCN reaction causing severe rash involving mucus membranes or skin necrosis: No Has patient had a PCN reaction that required hospitalization: No Has patient had a PCN reaction occurring within the last 10 years: No If all of the above answers are "NO", then may proceed with Cephalosporin use.   Marland Kitchen Benicar [Olmesartan] Rash    Itching rash  . Codeine Sulfate Rash  . Levaquin [Levofloxacin In D5w] Rash    Alters mental status  . Penicillin V Potassium Rash    Has patient had a  PCN reaction causing immediate rash, facial/tongue/throat swelling, SOB or lightheadedness with hypotension: No Has patient had a PCN reaction causing severe rash involving mucus membranes or skin necrosis: No Has patient had a PCN reaction that required hospitalization: No Has patient had a PCN reaction occurring within the last 10 years: No If all of the above answers are "NO", then may proceed with Cephalosporin use.   . Sulfa Antibiotics Rash   VITALS:  Blood pressure 111/70, pulse 78, temperature 98.7 F (37.1 C), temperature source Oral, resp. rate 19, height 5' (1.524 m), weight 77.7 kg, SpO2 92 %. PHYSICAL EXAMINATION:  Physical Exam Constitutional:      General: She is not in acute distress. HENT:     Head: Normocephalic.     Mouth/Throat:     Mouth: Mucous membranes are moist.  Eyes:     General: No scleral icterus.    Conjunctiva/sclera: Conjunctivae normal.     Pupils: Pupils are equal, round, and reactive to light.  Neck:     Musculoskeletal: Normal range of motion and neck supple.     Vascular: No JVD.     Trachea: No tracheal deviation.  Cardiovascular:     Rate and Rhythm: Normal rate and regular rhythm.     Heart sounds: Normal heart sounds. No murmur. No gallop.   Pulmonary:     Effort: Pulmonary effort is normal. No respiratory distress.     Breath sounds: Normal breath sounds. No wheezing or rales.  Abdominal:     General: Bowel sounds are normal. There is no distension.     Palpations: Abdomen is soft.     Tenderness:  There is no abdominal tenderness. There is no rebound.  Musculoskeletal: Normal range of motion.        General: No tenderness.     Right lower leg: No edema.     Left lower leg: No edema.  Skin:    Findings: No erythema or rash.  Neurological:     General: No focal deficit present.     Mental Status: She is alert and oriented to person, place, and time.     Cranial Nerves: No cranial nerve deficit.  Psychiatric:        Mood and  Affect: Mood normal.    LABORATORY PANEL:  Female CBC Recent Labs  Lab 07/19/19 0516  WBC 9.8  HGB 9.4*  HCT 29.9*  PLT 352   ------------------------------------------------------------------------------------------------------------------ Chemistries  Recent Labs  Lab 07/15/19 2021  NA 135  K 3.9  CL 99  CO2 26  GLUCOSE 121*  BUN 11  CREATININE 0.77  CALCIUM 9.3  AST 33  ALT 13  ALKPHOS 53  BILITOT 0.7   RADIOLOGY:  No results found. ASSESSMENT AND PLAN:   1. Bilateral pulmonary emboliand left femoropoplitealDVT:  Venous duplex report left leg DVT.Echo is unremarkable. The left ventricle has normal systolic function with an ejection fraction of 60-65%. She has been treated with heparin dripand aspirin.No indication for thrombectomy per vascular surgeon.  Changed to p.o. Eliquis.  I discussed with patient about the benefit and side effect of anticoagulation medications.  Acute respiratory failure with hypoxia due to above. Weaned off oxygen. 2. Hypertension:Better controlled.ResumedImdur, amlodipine and carvedilol. 3. Hypothyroidism: continue Synthroid 4. Obesity: BMI is 33.2; encourage healthy diet and exercise. Anemia of chronic disease.  Stable.  Waiting for COVID-19 test to be discharged to nursing facility. All the records are reviewed and case discussed with Care Management/Social Worker. Management plans discussed with the patient, family and they are in agreement.  CODE STATUS: Full Code  TOTAL TIME TAKING CARE OF THIS PATIENT: 25 minutes.   More than 50% of the time was spent in counseling/coordination of care: YES  POSSIBLE D/C IN 1 DAYS, DEPENDING ON CLINICAL CONDITION.   Demetrios Loll M.D on 07/19/2019 at 4:03 PM  Between 7am to 6pm - Pager - [phone]  After 6pm go to [email or web address] - [facility]

## 2019-07-19 NOTE — Plan of Care (Signed)
  Problem: Clinical Measurements: Goal: Respiratory complications will improve Outcome: Progressing Note: Now on room air   Problem: Nutrition: Goal: Adequate nutrition will be maintained Outcome: Progressing   Problem: Coping: Goal: Level of anxiety will decrease Outcome: Progressing Note: Ativan given once at bedtime    Problem: Elimination: Goal: Will not experience complications related to urinary retention Outcome: Progressing   Problem: Pain Managment: Goal: General experience of comfort will improve Outcome: Progressing Note: No complaints of pain this shift   Problem: Safety: Goal: Ability to remain free from injury will improve Outcome: Progressing   Problem: Education: Goal: Knowledge of General Education information will improve Description: Including pain rating scale, medication(s)/side effects and non-pharmacologic comfort measures Outcome: Completed/Met

## 2019-07-19 NOTE — Progress Notes (Signed)
SUBJECTIVE: Patient is feeling much better denies any chest pain or shortness of breath   Vitals:   07/19/19 0724 07/19/19 0743 07/19/19 0935 07/19/19 0937  BP: (!) 124/57 108/61 111/70 111/70  Pulse: 76 76 78 78  Resp:  19    Temp: 99.3 F (37.4 C) 98.7 F (37.1 C)    TempSrc: Oral Oral    SpO2: 91% 92%    Weight:      Height:        Intake/Output Summary (Last 24 hours) at 07/19/2019 0942 Last data filed at 07/19/2019 4142 Gross per 24 hour  Intake 10 ml  Output 500 ml  Net -490 ml    LABS: Basic Metabolic Panel: No results for input(s): NA, K, CL, CO2, GLUCOSE, BUN, CREATININE, CALCIUM, MG, PHOS in the last 72 hours. Liver Function Tests: No results for input(s): AST, ALT, ALKPHOS, BILITOT, PROT, ALBUMIN in the last 72 hours. No results for input(s): LIPASE, AMYLASE in the last 72 hours. CBC: Recent Labs    07/18/19 0553 07/19/19 0516  WBC 10.8* 9.8  HGB 10.0* 9.4*  HCT 31.7* 29.9*  MCV 89.3 87.7  PLT 329 352   Cardiac Enzymes: No results for input(s): CKTOTAL, CKMB, CKMBINDEX, TROPONINI in the last 72 hours. BNP: Invalid input(s): POCBNP D-Dimer: No results for input(s): DDIMER in the last 72 hours. Hemoglobin A1C: No results for input(s): HGBA1C in the last 72 hours. Fasting Lipid Panel: No results for input(s): CHOL, HDL, LDLCALC, TRIG, CHOLHDL, LDLDIRECT in the last 72 hours. Thyroid Function Tests: No results for input(s): TSH, T4TOTAL, T3FREE, THYROIDAB in the last 72 hours.  Invalid input(s): FREET3 Anemia Panel: No results for input(s): VITAMINB12, FOLATE, FERRITIN, TIBC, IRON, RETICCTPCT in the last 72 hours.   PHYSICAL EXAM General: Well developed, well nourished, in no acute distress HEENT:  Normocephalic and atramatic Neck:  No JVD.  Lungs: Clear bilaterally to auscultation and percussion. Heart: HRRR . Normal S1 and S2 without gallops or murmurs.  Abdomen: Bowel sounds are positive, abdomen soft and non-tender  Msk:  Back normal, normal  gait. Normal strength and tone for age. Extremities: No clubbing, cyanosis or edema.   Neuro: Alert and oriented X 3. Psych:  Good affect, responds appropriately  TELEMETRY: Sinus rhythm  ASSESSMENT AND PLAN: Status post pulmonary embolism without any hemodynamic compromise with some mild dilatation of RV with normal preserved left ventricular systolic function without any significant pulmonary hypertension.  Anticoagulants can be changed to oral of either Coumadin or Xarelto versus Eliquis.  And can be discharged with follow-up office on Monday at 10:00.  Active Problems:   Pulmonary embolism ([place])    Dionisio David, MD, [facility] 07/19/2019 9:42 AM

## 2019-07-19 NOTE — Discharge Summary (Addendum)
[facility] [place] NAME: Erin Sutton    MR#:  [id number]  DATE OF BIRTH:  78/15/1942  DATE OF ADMISSION:  07/15/2019   ADMITTING PHYSICIAN: Harrie Foreman, MD  DATE OF DISCHARGE:  07/19/2019 PRIMARY CARE PHYSICIAN: Baxter Hire, MD   ADMISSION DIAGNOSIS:  Elevated d-dimer [R78.89] Dyspnea, unspecified type [R06.00] Acute pulmonary embolism with acute cor pulmonale, unspecified pulmonary embolism type ([place]) [I26.09] DISCHARGE DIAGNOSIS:  Active Problems:   Pulmonary embolism ([place])  SECONDARY DIAGNOSIS:   Past Medical History:  Diagnosis Date  . Anxiety   . Arthritis   . Chronic kidney disease   . Constipation   . GERD (gastroesophageal reflux disease)   . Hypertension   . Hypothyroidism   . Osteoporosis   . Sleep apnea   . Urinary incontinence    HOSPITAL COURSE:  1. Bilateral pulmonary emboli and left femoropopliteal DVT:  Venous duplex report left leg DVT.   Echo is unremarkable. The left ventricle has normal systolic function with an ejection fraction of 60-65%.  Follow-up with Dr. Humphrey Rolls as outpatient. She has been treated with heparin dripand aspirin.  No indication for thrombectomy per vascular surgeon.  Changed to p.o. Eliquis.  I discussed with patient about the benefit and side effect of anticoagulation medications.  Acute respiratory failure with hypoxia due to above. Weaned off oxygen. 2. Hypertension: Better controlled.  ResumedImdur, amlodipine and carvedilol. 3. Hypothyroidism: continue Synthroid 4. Obesity: BMI is 33.2; encourage healthy diet and exercise.  DISCHARGE CONDITIONS:   CONSULTS OBTAINED:  Treatment Team:  Dionisio David, MD DRUG ALLERGIES:   Allergies  Allergen Reactions  . Ace Inhibitors Cough  . Amoxicillin Rash    Has patient had a PCN reaction causing immediate rash, facial/tongue/throat swelling, SOB or lightheadedness with hypotension: No Has patient had a PCN  reaction causing severe rash involving mucus membranes or skin necrosis: No Has patient had a PCN reaction that required hospitalization: No Has patient had a PCN reaction occurring within the last 10 years: No If all of the above answers are "NO", then may proceed with Cephalosporin use.   Marland Kitchen Benicar [Olmesartan] Rash    Itching rash  . Codeine Sulfate Rash  . Levaquin [Levofloxacin In D5w] Rash    Alters mental status  . Penicillin V Potassium Rash    Has patient had a PCN reaction causing immediate rash, facial/tongue/throat swelling, SOB or lightheadedness with hypotension: No Has patient had a PCN reaction causing severe rash involving mucus membranes or skin necrosis: No Has patient had a PCN reaction that required hospitalization: No Has patient had a PCN reaction occurring within the last 10 years: No If all of the above answers are "NO", then may proceed with Cephalosporin use.   . Sulfa Antibiotics Rash   DISCHARGE MEDICATIONS:   Allergies as of 07/19/2019      Reactions   Ace Inhibitors Cough   Amoxicillin Rash   Has patient had a PCN reaction causing immediate rash, facial/tongue/throat swelling, SOB or lightheadedness with hypotension: No Has patient had a PCN reaction causing severe rash involving mucus membranes or skin necrosis: No Has patient had a PCN reaction that required hospitalization: No Has patient had a PCN reaction occurring within the last 10 years: No If all of the above answers are "NO", then may proceed with Cephalosporin use.   Benicar [olmesartan] Rash   Itching rash   Codeine Sulfate Rash   Levaquin [levofloxacin In D5w] Rash  Alters mental status   Penicillin V Potassium Rash   Has patient had a PCN reaction causing immediate rash, facial/tongue/throat swelling, SOB or lightheadedness with hypotension: No Has patient had a PCN reaction causing severe rash involving mucus membranes or skin necrosis: No Has patient had a PCN reaction that required  hospitalization: No Has patient had a PCN reaction occurring within the last 10 years: No If all of the above answers are "NO", then may proceed with Cephalosporin use.   Sulfa Antibiotics Rash      Medication List    STOP taking these medications   meloxicam 15 MG tablet Commonly known as: MOBIC     TAKE these medications   amLODipine 10 MG tablet Commonly known as: NORVASC Take 10 mg by mouth daily.   apixaban 5 MG Tabs tablet Commonly known as: ELIQUIS 10 mg po bid for 7 days, then 5 mg po bid.   aspirin EC 81 MG tablet Take 81 mg by mouth daily.   carvedilol 12.5 MG tablet Commonly known as: COREG Take 12.5 mg by mouth 2 (two) times daily.   cholecalciferol 25 MCG (1000 UT) tablet Commonly known as: VITAMIN D3 Take 1,000 Units by mouth daily.   cyanocobalamin 1000 MCG/ML injection Commonly known as: (VITAMIN B-12) Inject 1,000 mcg into the muscle every 30 (thirty) days.   docusate sodium 100 MG capsule Commonly known as: Colace Take 1 capsule (100 mg total) by mouth 2 (two) times daily.   feeding supplement (ENSURE ENLIVE) Liqd Take 237 mLs by mouth 2 (two) times daily between meals.   isosorbide mononitrate 30 MG 24 hr tablet Commonly known as: IMDUR Take 30 mg by mouth daily.   levothyroxine 50 MCG tablet Commonly known as: SYNTHROID Take 50 mcg by mouth daily before breakfast.   LORazepam 2 MG tablet Commonly known as: ATIVAN Take 2 mg by mouth every 8 (eight) hours as needed for anxiety.   magnesium oxide 400 MG tablet Commonly known as: MAG-OX Take 400 mg by mouth daily.   ondansetron 8 MG tablet Commonly known as: ZOFRAN Take 8 mg by mouth every 8 (eight) hours as needed.   pantoprazole 40 MG tablet Commonly known as: PROTONIX Take 40 mg by mouth daily.   potassium chloride SA 20 MEQ tablet Commonly known as: K-DUR Take 20 mEq by mouth every morning.   pravastatin 40 MG tablet Commonly known as: PRAVACHOL Take 40 mg by mouth at  bedtime.   sertraline 100 MG tablet Commonly known as: ZOLOFT Take 100 mg by mouth at bedtime.   simethicone 80 MG chewable tablet Commonly known as: MYLICON Chew 1 tablet (80 mg total) by mouth every 6 (six) hours as needed for flatulence.   SYSTANE OP Place 1 drop into both eyes 2 (two) times daily.        DISCHARGE INSTRUCTIONS:  See AVS, If you experience worsening of your admission symptoms, develop shortness of breath, life threatening emergency, suicidal or homicidal thoughts you must seek medical attention immediately by calling 911 or calling your MD immediately  if symptoms less severe.  You Must read complete instructions/literature along with all the possible adverse reactions/side effects for all the Medicines you take and that have been prescribed to you. Take any new Medicines after you have completely understood and accpet all the possible adverse reactions/side effects.   Please note  You were cared for by a hospitalist during your hospital stay. If you have any questions about your discharge medications or the  care you received while you were in the hospital after you are discharged, you can call the unit and asked to speak with the hospitalist on call if the hospitalist that took care of you is not available. Once you are discharged, your primary care physician will handle any further medical issues. Please note that NO REFILLS for any discharge medications will be authorized once you are discharged, as it is imperative that you return to your primary care physician (or establish a relationship with a primary care physician if you do not have one) for your aftercare needs so that they can reassess your need for medications and monitor your lab values.    On the day of Discharge:  VITAL SIGNS:  Blood pressure 111/70, pulse 78, temperature 98.7 F (37.1 C), temperature source Oral, resp. rate 19, height 5' (1.524 m), weight 77.7 kg, SpO2 92 %. PHYSICAL EXAMINATION:   GENERAL:  79 y.o.-year-old patient lying in the bed with no acute distress.  EYES: Pupils equal, round, reactive to light and accommodation. No scleral icterus. Extraocular muscles intact.  HEENT: Head atraumatic, normocephalic. Oropharynx and nasopharynx clear.  NECK:  Supple, no jugular venous distention. No thyroid enlargement, no tenderness.  LUNGS: Normal breath sounds bilaterally, no wheezing, rales,rhonchi or crepitation. No use of accessory muscles of respiration.  CARDIOVASCULAR: S1, S2 normal. No murmurs, rubs, or gallops.  ABDOMEN: Soft, non-tender, non-distended. Bowel sounds present. No organomegaly or mass.  EXTREMITIES: No pedal edema, cyanosis, or clubbing.  NEUROLOGIC: Cranial nerves II through XII are intact. Muscle strength 4/5 in all extremities. Sensation intact. Gait not checked.  PSYCHIATRIC: The patient is alert and oriented x 3.  SKIN: No obvious rash, lesion, or ulcer.  DATA REVIEW:   CBC Recent Labs  Lab 07/19/19 0516  WBC 9.8  HGB 9.4*  HCT 29.9*  PLT 352    Chemistries  Recent Labs  Lab 07/15/19 2021  NA 135  K 3.9  CL 99  CO2 26  GLUCOSE 121*  BUN 11  CREATININE 0.77  CALCIUM 9.3  AST 33  ALT 13  ALKPHOS 107  BILITOT 0.7     Microbiology Results  Results for orders placed or performed during the hospital encounter of 07/15/19  SARS CORONAVIRUS 2 Nasal Swab Aptima Multi Swab     Status: None   Collection Time: 07/16/19  3:17 AM   Specimen: Aptima Multi Swab; Nasal Swab  Result Value Ref Range Status   SARS Coronavirus 2 NEGATIVE NEGATIVE Final    Comment: (NOTE) SARS-CoV-2 target nucleic acids are NOT DETECTED. The SARS-CoV-2 RNA is generally detectable in upper and lower respiratory specimens during the acute phase of infection. Negative results do not preclude SARS-CoV-2 infection, do not rule out co-infections with other pathogens, and should not be used as the sole basis for treatment or other patient management decisions.  Negative results must be combined with clinical observations, patient history, and epidemiological information. The expected result is Negative. Fact Sheet for Patients: SugarRoll.be Fact Sheet for Healthcare Providers: https://www.woods-mathews.com/ This test is not yet approved or cleared by the [place] FDA and  has been authorized for detection and/or diagnosis of SARS-CoV-2 by FDA under an Emergency Use Authorization (EUA). This EUA will remain  in effect (meaning this test can be used) for the duration of the COVID-19 declaration under Section 56 4(b)(1) of the Act, 21 U.S.C. section 360bbb-3(b)(1), unless the authorization is terminated or revoked sooner. Performed at [facility], [address] [place],  [place] [address]   MRSA PCR Screening     Status: None   Collection Time: 07/16/19  5:09 AM   Specimen: Nasal Mucosa; Nasopharyngeal  Result Value Ref Range Status   MRSA by PCR NEGATIVE NEGATIVE Final    Comment:        The GeneXpert MRSA Assay (FDA approved for NASAL specimens only), is one component of a comprehensive MRSA colonization surveillance program. It is not intended to diagnose MRSA infection nor to guide or monitor treatment for MRSA infections. Performed at [facility], [address], [place], [place] [address]     RADIOLOGY:  No results found.   Management plans discussed with the patient, family and they are in agreement.  CODE STATUS: Full Code   TOTAL TIME TAKING CARE OF THIS PATIENT: 32 minutes.    Demetrios Loll M.D on 07/19/2019 at 10:41 AM  Between 7am to 6pm - Pager - [phone]  After 6pm go to [email or web address] - Proofreader  [facility] [place] [facility]  Office  [phone]  CC: Primary care physician; Baxter Hire, MD   Note: This dictation was prepared with Dragon dictation along with smaller phrase technology. Any transcriptional errors that  result from this process are unintentional.

## 2019-07-19 NOTE — Care Management Important Message (Signed)
Important Message  Patient Details  Name: Erin Sutton MRN: [id number] Date of Birth: Feb 13, 1941   Medicare Important Message Given:  Yes     [name] 07/19/2019, 11:00 AM

## 2019-07-20 LAB — NOVEL CORONAVIRUS, NAA (HOSP ORDER, SEND-OUT TO REF LAB; TAT 18-24 HRS): SARS-CoV-2, NAA: NOT DETECTED

## 2019-07-20 MED ORDER — APIXABAN 5 MG PO TABS: ORAL_TABLET | ORAL | 1 refills | Status: DC

## 2019-07-20 NOTE — Discharge Summary (Signed)
[facility] NAME: Erin Sutton    MR#:  [id number]  DATE OF BIRTH:  [birth year]  DATE OF ADMISSION:  07/15/2019   ADMITTING PHYSICIAN: Harrie Foreman, MD  DATE OF DISCHARGE:  07/20/2019 PRIMARY CARE PHYSICIAN: Baxter Hire, MD   ADMISSION DIAGNOSIS:  Elevated d-dimer [R79.89] Dyspnea, unspecified type [R06.00] Acute pulmonary embolism with acute cor pulmonale, unspecified pulmonary embolism type ([place]) [I26.09] DISCHARGE DIAGNOSIS:  Active Problems:   Pulmonary embolism ([place])  SECONDARY DIAGNOSIS:   Past Medical History:  Diagnosis Date   Anxiety    Arthritis    Chronic kidney disease    Constipation    GERD (gastroesophageal reflux disease)    Hypertension    Hypothyroidism    Osteoporosis    Sleep apnea    Urinary incontinence    HOSPITAL COURSE:  1. Bilateral pulmonary emboli and left femoropopliteal DVT:  Venous duplex report left leg DVT.   Echo is unremarkable. The left ventricle has normal systolic function with an ejection fraction of 60-65%.  Follow-up with Dr. Humphrey Rolls as outpatient. She has been treated with heparin dripand aspirin.  No indication for thrombectomy per vascular surgeon.  Changed to p.o. Eliquis.  I discussed with patient about the benefit and side effect of anticoagulation medications.  Acute respiratory failure with hypoxia due to above. Weaned off oxygen. 2. Hypertension: Better controlled.  ResumedImdur, amlodipine and carvedilol. 3. Hypothyroidism: continue Synthroid 4. Obesity: BMI is 33.2; encourage healthy diet and exercise. COVID-19 test is negative. DISCHARGE CONDITIONS:  Stable, discharge to nursing home today. CONSULTS OBTAINED:  Treatment Team:  Dionisio David, MD DRUG ALLERGIES:   Allergies  Allergen Reactions   Ace Inhibitors Cough   Amoxicillin Rash    Has patient had a PCN reaction causing immediate rash, facial/tongue/throat swelling, SOB  or lightheadedness with hypotension: No Has patient had a PCN reaction causing severe rash involving mucus membranes or skin necrosis: No Has patient had a PCN reaction that required hospitalization: No Has patient had a PCN reaction occurring within the last 10 years: No If all of the above answers are "NO", then may proceed with Cephalosporin use.    Benicar [Olmesartan] Rash    Itching rash   Codeine Sulfate Rash   Levaquin [Levofloxacin In D5w] Rash    Alters mental status   Penicillin V Potassium Rash    Has patient had a PCN reaction causing immediate rash, facial/tongue/throat swelling, SOB or lightheadedness with hypotension: No Has patient had a PCN reaction causing severe rash involving mucus membranes or skin necrosis: No Has patient had a PCN reaction that required hospitalization: No Has patient had a PCN reaction occurring within the last 10 years: No If all of the above answers are "NO", then may proceed with Cephalosporin use.    Sulfa Antibiotics Rash   DISCHARGE MEDICATIONS:   Allergies as of 07/20/2019      Reactions   Ace Inhibitors Cough   Amoxicillin Rash   Has patient had a PCN reaction causing immediate rash, facial/tongue/throat swelling, SOB or lightheadedness with hypotension: No Has patient had a PCN reaction causing severe rash involving mucus membranes or skin necrosis: No Has patient had a PCN reaction that required hospitalization: No Has patient had a PCN reaction occurring within the last 10 years: No If all of the above answers are "NO", then may proceed with Cephalosporin use.   Benicar [olmesartan] Rash   Itching rash   Codeine Sulfate  Rash   Levaquin [levofloxacin In D5w] Rash   Alters mental status   Penicillin V Potassium Rash   Has patient had a PCN reaction causing immediate rash, facial/tongue/throat swelling, SOB or lightheadedness with hypotension: No Has patient had a PCN reaction causing severe rash involving mucus membranes or  skin necrosis: No Has patient had a PCN reaction that required hospitalization: No Has patient had a PCN reaction occurring within the last 10 years: No If all of the above answers are "NO", then may proceed with Cephalosporin use.   Sulfa Antibiotics Rash      Medication List    STOP taking these medications   meloxicam 15 MG tablet Commonly known as: MOBIC     TAKE these medications   amLODipine 10 MG tablet Commonly known as: NORVASC Take 10 mg by mouth daily.   apixaban 5 MG Tabs tablet Commonly known as: ELIQUIS 10 mg po bid for 5 days, then 5 mg po bid.   aspirin EC 81 MG tablet Take 81 mg by mouth daily.   carvedilol 12.5 MG tablet Commonly known as: COREG Take 12.5 mg by mouth 2 (two) times daily.   cholecalciferol 25 MCG (1000 UT) tablet Commonly known as: VITAMIN D3 Take 1,000 Units by mouth daily.   cyanocobalamin 1000 MCG/ML injection Commonly known as: (VITAMIN B-12) Inject 1,000 mcg into the muscle every 30 (thirty) days.   docusate sodium 100 MG capsule Commonly known as: Colace Take 1 capsule (100 mg total) by mouth 2 (two) times daily.   feeding supplement (ENSURE ENLIVE) Liqd Take 237 mLs by mouth 2 (two) times daily between meals.   isosorbide mononitrate 30 MG 24 hr tablet Commonly known as: IMDUR Take 30 mg by mouth daily.   levothyroxine 50 MCG tablet Commonly known as: SYNTHROID Take 50 mcg by mouth daily before breakfast.   LORazepam 2 MG tablet Commonly known as: ATIVAN Take 2 mg by mouth every 8 (eight) hours as needed for anxiety.   magnesium oxide 400 MG tablet Commonly known as: MAG-OX Take 400 mg by mouth daily.   ondansetron 8 MG tablet Commonly known as: ZOFRAN Take 8 mg by mouth every 8 (eight) hours as needed.   pantoprazole 40 MG tablet Commonly known as: PROTONIX Take 40 mg by mouth daily.   potassium chloride SA 20 MEQ tablet Commonly known as: K-DUR Take 20 mEq by mouth every morning.   pravastatin 40 MG  tablet Commonly known as: PRAVACHOL Take 40 mg by mouth at bedtime.   sertraline 100 MG tablet Commonly known as: ZOLOFT Take 100 mg by mouth at bedtime.   simethicone 80 MG chewable tablet Commonly known as: MYLICON Chew 1 tablet (80 mg total) by mouth every 6 (six) hours as needed for flatulence.   SYSTANE OP Place 1 drop into both eyes 2 (two) times daily.        DISCHARGE INSTRUCTIONS:  See AVS, If you experience worsening of your admission symptoms, develop shortness of breath, life threatening emergency, suicidal or homicidal thoughts you must seek medical attention immediately by calling 911 or calling your MD immediately  if symptoms less severe.  You Must read complete instructions/literature along with all the possible adverse reactions/side effects for all the Medicines you take and that have been prescribed to you. Take any new Medicines after you have completely understood and accpet all the possible adverse reactions/side effects.   Please note  You were cared for by a hospitalist during your hospital stay. If  you have any questions about your discharge medications or the care you received while you were in the hospital after you are discharged, you can call the unit and asked to speak with the hospitalist on call if the hospitalist that took care of you is not available. Once you are discharged, your primary care physician will handle any further medical issues. Please note that NO REFILLS for any discharge medications will be authorized once you are discharged, as it is imperative that you return to your primary care physician (or establish a relationship with a primary care physician if you do not have one) for your aftercare needs so that they can reassess your need for medications and monitor your lab values.    On the day of Discharge:  VITAL SIGNS:  Blood pressure 131/84, pulse 78, temperature 98.8 F (37.1 C), temperature source Oral, resp. rate 18, height 5'  (1.524 m), weight 75.9 kg, SpO2 94 %. PHYSICAL EXAMINATION:  GENERAL:  78 y.o.-year-old patient lying in the bed with no acute distress.  EYES: Pupils equal, round, reactive to light and accommodation. No scleral icterus. Extraocular muscles intact.  HEENT: Head atraumatic, normocephalic. Oropharynx and nasopharynx clear.  NECK:  Supple, no jugular venous distention. No thyroid enlargement, no tenderness.  LUNGS: Normal breath sounds bilaterally, no wheezing, rales,rhonchi or crepitation. No use of accessory muscles of respiration.  CARDIOVASCULAR: S1, S2 normal. No murmurs, rubs, or gallops.  ABDOMEN: Soft, non-tender, non-distended. Bowel sounds present. No organomegaly or mass.  EXTREMITIES: No pedal edema, cyanosis, or clubbing.  NEUROLOGIC: Cranial nerves II through XII are intact. Muscle strength 4/5 in all extremities. Sensation intact. Gait not checked.  PSYCHIATRIC: The patient is alert and oriented x 3.  SKIN: No obvious rash, lesion, or ulcer.  DATA REVIEW:   CBC Recent Labs  Lab 07/19/19 0516  WBC 9.8  HGB 9.4*  HCT 29.9*  PLT 352    Chemistries  Recent Labs  Lab 07/15/19 2021  NA 135  K 3.9  CL 99  CO2 26  GLUCOSE 121*  BUN 11  CREATININE 0.77  CALCIUM 9.3  AST 33  ALT 13  ALKPHOS 12  BILITOT 0.7     Microbiology Results  Results for orders placed or performed during the hospital encounter of 07/15/19  SARS CORONAVIRUS 2 Nasal Swab Aptima Multi Swab     Status: None   Collection Time: 07/16/19  3:17 AM   Specimen: Aptima Multi Swab; Nasal Swab  Result Value Ref Range Status   SARS Coronavirus 2 NEGATIVE NEGATIVE Final    Comment: (NOTE) SARS-CoV-2 target nucleic acids are NOT DETECTED. The SARS-CoV-2 RNA is generally detectable in upper and lower respiratory specimens during the acute phase of infection. Negative results do not preclude SARS-CoV-2 infection, do not rule out co-infections with other pathogens, and should not be used as the sole  basis for treatment or other patient management decisions. Negative results must be combined with clinical observations, patient history, and epidemiological information. The expected result is Negative. Fact Sheet for Patients: SugarRoll.be Fact Sheet for Healthcare Providers: https://www.woods-mathews.com/ This test is not yet approved or cleared by the [place] FDA and  has been authorized for detection and/or diagnosis of SARS-CoV-2 by FDA under an Emergency Use Authorization (EUA). This EUA will remain  in effect (meaning this test can be used) for the duration of the COVID-19 declaration under Section 56 4(b)(1) of the Act, 21 U.S.C. section 360bbb-3(b)(1), unless the authorization is terminated or revoked sooner. Performed  at [facility], [place] [address], [place], [place] [address]   MRSA PCR Screening     Status: None   Collection Time: 07/16/19  5:09 AM   Specimen: Nasal Mucosa; Nasopharyngeal  Result Value Ref Range Status   MRSA by PCR NEGATIVE NEGATIVE Final    Comment:        The GeneXpert MRSA Assay (FDA approved for NASAL specimens only), is one component of a comprehensive MRSA colonization surveillance program. It is not intended to diagnose MRSA infection nor to guide or monitor treatment for MRSA infections. Performed at [facility], [address] [place], [place] [address]   Novel Coronavirus, NAA (hospital order; send-out to ref lab)     Status: None   Collection Time: 07/19/19  9:45 AM   Specimen: Nasopharyngeal Swab; Respiratory  Result Value Ref Range Status   SARS-CoV-2, NAA NOT DETECTED NOT DETECTED Final    Comment: (NOTE) This test was developed and its performance characteristics determined by [employer]. This test has not been FDA cleared or approved. This test has been authorized by FDA under an Emergency Use Authorization (EUA). This test is only authorized for the  duration of time the declaration that circumstances exist justifying the authorization of the emergency use of in vitro diagnostic tests for detection of SARS-CoV-2 virus and/or diagnosis of COVID-19 infection under section 564(b)(1) of the Act, 21 U.S.C. 785YIF-0(Y)(7), unless the authorization is terminated or revoked sooner. When diagnostic testing is negative, the possibility of a false negative result should be considered in the context of a patient's recent exposures and the presence of clinical signs and symptoms consistent with COVID-19. An individual without symptoms of COVID-19 and who is not shedding SARS-CoV-2 virus would expect to have a negative (not detected) result in this assay. Performed  At: [facility] [place], [place] [id number] Rush Farmer MD EH:[id number]    [place]  Final    Comment: Performed at [facility], [address] [place], [place] [address]    RADIOLOGY:  No results found.   Management plans discussed with the patient, family and they are in agreement.  CODE STATUS: Full Code   TOTAL TIME TAKING CARE OF THIS PATIENT: 32 minutes.    Demetrios Loll M.D on 07/20/2019 at 2:17 PM  Between 7am to 6pm - Pager - [phone]  After 6pm go to [email or web address] - Proofreader  [facility]  Office  [phone]  CC: Primary care physician; Baxter Hire, MD   Note: This dictation was prepared with Dragon dictation along with smaller phrase technology. Any transcriptional errors that result from this process are unintentional.

## 2019-07-20 NOTE — Progress Notes (Signed)
[facility] NAME: Erin Sutton    MR#:  [id number]  DATE OF BIRTH:  08-16-1941  DATE OF ADMISSION:  07/15/2019   ADMITTING PHYSICIAN: Harrie Foreman, MD  DATE OF DISCHARGE:  07/20/2019 PRIMARY CARE PHYSICIAN: Baxter Hire, MD   ADMISSION DIAGNOSIS:  Elevated d-dimer [R79.89] Dyspnea, unspecified type [R06.00] Acute pulmonary embolism with acute cor pulmonale, unspecified pulmonary embolism type ([place]) [I26.09] DISCHARGE DIAGNOSIS:  Active Problems:   Pulmonary embolism ([place])  SECONDARY DIAGNOSIS:   Past Medical History:  Diagnosis Date   Anxiety    Arthritis    Chronic kidney disease    Constipation    GERD (gastroesophageal reflux disease)    Hypertension    Hypothyroidism    Osteoporosis    Sleep apnea    Urinary incontinence    HOSPITAL COURSE:  1. Bilateral pulmonary emboli and left femoropopliteal DVT:  Venous duplex report left leg DVT.   Echo is unremarkable. The left ventricle has normal systolic function with an ejection fraction of 60-65%.  Follow-up with Dr. Humphrey Rolls as outpatient. She has been treated with heparin dripand aspirin.  No indication for thrombectomy per vascular surgeon.  Changed to p.o. Eliquis.  I discussed with patient about the benefit and side effect of anticoagulation medications.  Acute respiratory failure with hypoxia due to above. Weaned off oxygen. 2. Hypertension: Better controlled.  ResumedImdur, amlodipine and carvedilol. 3. Hypothyroidism: continue Synthroid 4. Obesity: BMI is 33.2; encourage healthy diet and exercise. COVID-19 test is negative. DISCHARGE CONDITIONS:  Stable, discharge to nursing home today. CONSULTS OBTAINED:  Treatment Team:  Dionisio David, MD DRUG ALLERGIES:   Allergies  Allergen Reactions   Ace Inhibitors Cough   Amoxicillin Rash    Has patient had a PCN reaction causing immediate rash, facial/tongue/throat swelling, SOB  or lightheadedness with hypotension: No Has patient had a PCN reaction causing severe rash involving mucus membranes or skin necrosis: No Has patient had a PCN reaction that required hospitalization: No Has patient had a PCN reaction occurring within the last 10 years: No If all of the above answers are "NO", then may proceed with Cephalosporin use.    Benicar [Olmesartan] Rash    Itching rash   Codeine Sulfate Rash   Levaquin [Levofloxacin In D5w] Rash    Alters mental status   Penicillin V Potassium Rash    Has patient had a PCN reaction causing immediate rash, facial/tongue/throat swelling, SOB or lightheadedness with hypotension: No Has patient had a PCN reaction causing severe rash involving mucus membranes or skin necrosis: No Has patient had a PCN reaction that required hospitalization: No Has patient had a PCN reaction occurring within the last 10 years: No If all of the above answers are "NO", then may proceed with Cephalosporin use.    Sulfa Antibiotics Rash   DISCHARGE MEDICATIONS:   Allergies as of 07/20/2019      Reactions   Ace Inhibitors Cough   Amoxicillin Rash   Has patient had a PCN reaction causing immediate rash, facial/tongue/throat swelling, SOB or lightheadedness with hypotension: No Has patient had a PCN reaction causing severe rash involving mucus membranes or skin necrosis: No Has patient had a PCN reaction that required hospitalization: No Has patient had a PCN reaction occurring within the last 10 years: No If all of the above answers are "NO", then may proceed with Cephalosporin use.   Benicar [olmesartan] Rash   Itching rash   Codeine Sulfate  Rash   Levaquin [levofloxacin In D5w] Rash   Alters mental status   Penicillin V Potassium Rash   Has patient had a PCN reaction causing immediate rash, facial/tongue/throat swelling, SOB or lightheadedness with hypotension: No Has patient had a PCN reaction causing severe rash involving mucus membranes or  skin necrosis: No Has patient had a PCN reaction that required hospitalization: No Has patient had a PCN reaction occurring within the last 10 years: No If all of the above answers are "NO", then may proceed with Cephalosporin use.   Sulfa Antibiotics Rash      Medication List    STOP taking these medications   meloxicam 15 MG tablet Commonly known as: MOBIC     TAKE these medications   amLODipine 10 MG tablet Commonly known as: NORVASC Take 10 mg by mouth daily.   apixaban 5 MG Tabs tablet Commonly known as: ELIQUIS 10 mg po bid for 5 days, then 5 mg po bid.   aspirin EC 81 MG tablet Take 81 mg by mouth daily.   carvedilol 12.5 MG tablet Commonly known as: COREG Take 12.5 mg by mouth 2 (two) times daily.   cholecalciferol 25 MCG (1000 UT) tablet Commonly known as: VITAMIN D3 Take 1,000 Units by mouth daily.   cyanocobalamin 1000 MCG/ML injection Commonly known as: (VITAMIN B-12) Inject 1,000 mcg into the muscle every 30 (thirty) days.   docusate sodium 100 MG capsule Commonly known as: Colace Take 1 capsule (100 mg total) by mouth 2 (two) times daily.   feeding supplement (ENSURE ENLIVE) Liqd Take 237 mLs by mouth 2 (two) times daily between meals.   isosorbide mononitrate 30 MG 24 hr tablet Commonly known as: IMDUR Take 30 mg by mouth daily.   levothyroxine 50 MCG tablet Commonly known as: SYNTHROID Take 50 mcg by mouth daily before breakfast.   LORazepam 2 MG tablet Commonly known as: ATIVAN Take 2 mg by mouth every 8 (eight) hours as needed for anxiety.   magnesium oxide 400 MG tablet Commonly known as: MAG-OX Take 400 mg by mouth daily.   ondansetron 8 MG tablet Commonly known as: ZOFRAN Take 8 mg by mouth every 8 (eight) hours as needed.   pantoprazole 40 MG tablet Commonly known as: PROTONIX Take 40 mg by mouth daily.   potassium chloride SA 20 MEQ tablet Commonly known as: K-DUR Take 20 mEq by mouth every morning.   pravastatin 40 MG  tablet Commonly known as: PRAVACHOL Take 40 mg by mouth at bedtime.   sertraline 100 MG tablet Commonly known as: ZOLOFT Take 100 mg by mouth at bedtime.   simethicone 80 MG chewable tablet Commonly known as: MYLICON Chew 1 tablet (80 mg total) by mouth every 6 (six) hours as needed for flatulence.   SYSTANE OP Place 1 drop into both eyes 2 (two) times daily.        DISCHARGE INSTRUCTIONS:  See AVS, If you experience worsening of your admission symptoms, develop shortness of breath, life threatening emergency, suicidal or homicidal thoughts you must seek medical attention immediately by calling 911 or calling your MD immediately  if symptoms less severe.  You Must read complete instructions/literature along with all the possible adverse reactions/side effects for all the Medicines you take and that have been prescribed to you. Take any new Medicines after you have completely understood and accpet all the possible adverse reactions/side effects.   Please note  You were cared for by a hospitalist during your hospital stay. If  you have any questions about your discharge medications or the care you received while you were in the hospital after you are discharged, you can call the unit and asked to speak with the hospitalist on call if the hospitalist that took care of you is not available. Once you are discharged, your primary care physician will handle any further medical issues. Please note that NO REFILLS for any discharge medications will be authorized once you are discharged, as it is imperative that you return to your primary care physician (or establish a relationship with a primary care physician if you do not have one) for your aftercare needs so that they can reassess your need for medications and monitor your lab values.    On the day of Discharge:  VITAL SIGNS:  Blood pressure 131/84, pulse 78, temperature 98.8 F (37.1 C), temperature source Oral, resp. rate 18, height 5'  (1.524 m), weight 75.9 kg, SpO2 94 %. PHYSICAL EXAMINATION:  GENERAL:  78 y.o.-year-old patient lying in the bed with no acute distress.  EYES: Pupils equal, round, reactive to light and accommodation. No scleral icterus. Extraocular muscles intact.  HEENT: Head atraumatic, normocephalic. Oropharynx and nasopharynx clear.  NECK:  Supple, no jugular venous distention. No thyroid enlargement, no tenderness.  LUNGS: Normal breath sounds bilaterally, no wheezing, rales,rhonchi or crepitation. No use of accessory muscles of respiration.  CARDIOVASCULAR: S1, S2 normal. No murmurs, rubs, or gallops.  ABDOMEN: Soft, non-tender, non-distended. Bowel sounds present. No organomegaly or mass.  EXTREMITIES: No pedal edema, cyanosis, or clubbing.  NEUROLOGIC: Cranial nerves II through XII are intact. Muscle strength 4/5 in all extremities. Sensation intact. Gait not checked.  PSYCHIATRIC: The patient is alert and oriented x 3.  SKIN: No obvious rash, lesion, or ulcer.  DATA REVIEW:   CBC Recent Labs  Lab 07/19/19 0516  WBC 9.8  HGB 9.4*  HCT 29.9*  PLT 352    Chemistries  Recent Labs  Lab 07/15/19 2021  NA 135  K 3.9  CL 99  CO2 26  GLUCOSE 121*  BUN 11  CREATININE 0.77  CALCIUM 9.3  AST 33  ALT 13  ALKPHOS 62  BILITOT 0.7     Microbiology Results  Results for orders placed or performed during the hospital encounter of 07/15/19  SARS CORONAVIRUS 2 Nasal Swab Aptima Multi Swab     Status: None   Collection Time: 07/16/19  3:17 AM   Specimen: Aptima Multi Swab; Nasal Swab  Result Value Ref Range Status   SARS Coronavirus 2 NEGATIVE NEGATIVE Final    Comment: (NOTE) SARS-CoV-2 target nucleic acids are NOT DETECTED. The SARS-CoV-2 RNA is generally detectable in upper and lower respiratory specimens during the acute phase of infection. Negative results do not preclude SARS-CoV-2 infection, do not rule out co-infections with other pathogens, and should not be used as the sole  basis for treatment or other patient management decisions. Negative results must be combined with clinical observations, patient history, and epidemiological information. The expected result is Negative. Fact Sheet for Patients: SugarRoll.be Fact Sheet for Healthcare Providers: https://www.woods-mathews.com/ This test is not yet approved or cleared by the [place] FDA and  has been authorized for detection and/or diagnosis of SARS-CoV-2 by FDA under an Emergency Use Authorization (EUA). This EUA will remain  in effect (meaning this test can be used) for the duration of the COVID-19 declaration under Section 56 4(b)(1) of the Act, 21 U.S.C. section 360bbb-3(b)(1), unless the authorization is terminated or revoked sooner. Performed  at [facility], [place] [address], [place], [place] [address]   MRSA PCR Screening     Status: None   Collection Time: 07/16/19  5:09 AM   Specimen: Nasal Mucosa; Nasopharyngeal  Result Value Ref Range Status   MRSA by PCR NEGATIVE NEGATIVE Final    Comment:        The GeneXpert MRSA Assay (FDA approved for NASAL specimens only), is one component of a comprehensive MRSA colonization surveillance program. It is not intended to diagnose MRSA infection nor to guide or monitor treatment for MRSA infections. Performed at [facility], [address] [place], [place] [address]   Novel Coronavirus, NAA (hospital order; send-out to ref lab)     Status: None   Collection Time: 07/19/19  9:45 AM   Specimen: Nasopharyngeal Swab; Respiratory  Result Value Ref Range Status   SARS-CoV-2, NAA NOT DETECTED NOT DETECTED Final    Comment: (NOTE) This test was developed and its performance characteristics determined by [employer]. This test has not been FDA cleared or approved. This test has been authorized by FDA under an Emergency Use Authorization (EUA). This test is only authorized for the  duration of time the declaration that circumstances exist justifying the authorization of the emergency use of in vitro diagnostic tests for detection of SARS-CoV-2 virus and/or diagnosis of COVID-19 infection under section 564(b)(1) of the Act, 21 U.S.C. 015IFB-3(P)(9), unless the authorization is terminated or revoked sooner. When diagnostic testing is negative, the possibility of a false negative result should be considered in the context of a patient's recent exposures and the presence of clinical signs and symptoms consistent with COVID-19. An individual without symptoms of COVID-19 and who is not shedding SARS-CoV-2 virus would expect to have a negative (not detected) result in this assay. Performed  At: [facility] [place], [place] [id number] Rush Farmer MD LK:[id number]    [place]  Final    Comment: Performed at [facility], [address] [place], [place] [address]    RADIOLOGY:  No results found.   Management plans discussed with the patient, family and they are in agreement.  CODE STATUS: Full Code   TOTAL TIME TAKING CARE OF THIS PATIENT: 32 minutes.    Demetrios Loll M.D on 07/20/2019 at 2:13 PM  Between 7am to 6pm - Pager - [phone]  After 6pm go to [email or web address] - Proofreader  [facility]  Office  [phone]  CC: Primary care physician; Baxter Hire, MD   Note: This dictation was prepared with Dragon dictation along with smaller phrase technology. Any transcriptional errors that result from this process are unintentional.

## 2019-07-20 NOTE — Progress Notes (Signed)
Patient discharged to [facility] via EMS, IV's removed no complaints at this time

## 2019-07-20 NOTE — Progress Notes (Signed)
Physical Therapy Treatment Patient Details Name: Erin Sutton MRN: [id number] DOB: 04-13-1941 Today's Date: 07/20/2019    History of Present Illness From MD H&P: The patient is a 78 yo female with past medical history of hypertension, hypothyroidism and sleep apnea presents to the emergency department complaining of shortness of breath.  The patient's dyspnea was accompanied by pleuritic chest pain that began earlier today.  Upon arrival to the emergency department the patient was mildly tachypneic but had no hypoxia or tachycardia.  However, she did have atypical chest pain and a elevated d-dimer which prompted CTA of the chest that revealed large bilateral PEs with evidence of right heart strain.  The patient was started on heparin drip prior to the emergency department staff calling the hospitalist service for admission.  MD assessment includes: Bilateral pulmonary emboli and left femoropopliteal DVT, Acute respiratory failure with hypoxia, HTN, hypothyroidism, and obesity.    PT Comments    Pt presents with deficits in strength, transfers, mobility, gait, balance, and activity tolerance.  Pt pleasant and motivated to participate during the session.  Pt's SpO2 94% and HR 72 bpm on room air with no adverse symptoms noted during the session.  Pt tolerated the below therex with SpO2 and HR remaining WNL.  Pt required CGA with SPT to [facility] from an elevated EOB with extra time and effort to stand.  Pt required mod verbal cues for hand placement during SPT with 2-3 small effortful steps taken during the transfer.  Pt will benefit from PT services in a SNF setting upon discharge to safely address above deficits for decreased caregiver assistance and eventual return to PLOF.     Follow Up Recommendations  SNF     Equipment Recommendations  None recommended by PT    Recommendations for Other Services       Precautions / Restrictions Precautions Precautions: Fall Precaution Comments: B PEs,  watch HR/SpO2 Restrictions Weight Bearing Restrictions: No    Mobility  Bed Mobility Overal bed mobility: Modified Independent             General bed mobility comments: Extra time and effort and the bed rail required during sup to/from sit but no physical assistance needed  Transfers Overall transfer level: Needs assistance   Transfers: Stand Pivot Transfers Sit to Stand: Min guard;From elevated surface            Ambulation/Gait             General Gait Details: Session ended early due to pt needing a BM, amb NT   Stairs             Wheelchair Mobility    Modified Rankin (Stroke Patients Only)       Balance Overall balance assessment: Needs assistance Sitting-balance support: Bilateral upper extremity supported;Feet supported Sitting balance-Leahy Scale: Good                                      Cognition Arousal/Alertness: Awake/alert Behavior During Therapy: WFL for tasks assessed/performed Overall Cognitive Status: Within Functional Limits for tasks assessed                                        Exercises Total Joint Exercises Ankle Circles/Pumps: Strengthening;Both;10 reps;15 reps Quad Sets: Strengthening;Both;10 reps;15 reps Gluteal Sets: Strengthening;Both;10 reps;15 reps Heel  Slides: AROM;Both;5 reps Hip ABduction/ADduction: Strengthening;10 reps;Both Straight Leg Raises: Strengthening;Both;10 reps Long Arc Quad: Strengthening;Both;10 reps Knee Flexion: Strengthening;Both;10 reps    General Comments        Pertinent Vitals/Pain Pain Assessment: No/denies pain    Home Living                      Prior Function            PT Goals (current goals can now be found in the care plan section) Progress towards PT goals: Progressing toward goals    Frequency    Min 2X/week      PT Plan Current plan remains appropriate    Co-evaluation              AM-PAC PT "6  Clicks" Mobility   Outcome Measure  Help needed turning from your back to your side while in a flat bed without using bedrails?: A Little Help needed moving from lying on your back to sitting on the side of a flat bed without using bedrails?: A Little Help needed moving to and from a bed to a chair (including a wheelchair)?: A Little Help needed standing up from a chair using your arms (e.g., wheelchair or bedside chair)?: A Little Help needed to walk in hospital room?: A Lot Help needed climbing 3-5 steps with a railing? : Total 6 Click Score: 15    End of Session Equipment Utilized During Treatment: Gait belt Activity Tolerance: Patient tolerated treatment well Patient left: Other (comment);with call bell/phone within reach(Pt on [facility] with call bell in reach with pt agreeing to call nsg when ready) Nurse Communication: Mobility status PT Visit Diagnosis: Muscle weakness (generalized) (M62.81);Difficulty in walking, not elsewhere classified (R26.2)     Time: 1100-1115 PT Time Calculation (min) (ACUTE ONLY): 15 min  Charges:  $Therapeutic Exercise: 8-22 mins                     D. Scott Tiera Mensinger PT, DPT 07/20/19, 12:06 PM

## 2019-07-20 NOTE — Plan of Care (Signed)
  Problem: Health Behavior/Discharge Planning: Goal: Ability to manage health-related needs will improve Outcome: Progressing   Problem: Clinical Measurements: Goal: Ability to maintain clinical measurements within normal limits will improve Outcome: Progressing Goal: Respiratory complications will improve Outcome: Progressing Goal: Cardiovascular complication will be avoided Outcome: Progressing   

## 2019-07-20 NOTE — Progress Notes (Signed)
SUBJECTIVE: Patient is feeling well and denies any chest pain or shortness of breath.   Vitals:   07/19/19 1950 07/20/19 0614 07/20/19 0615 07/20/19 0830  BP: (!) 113/58  119/68 131/84  Pulse: 79  74 78  Resp:   20 18  Temp: 98.3 F (36.8 C)  98 F (36.7 C) 98.8 F (37.1 C)  TempSrc: Oral  Oral Oral  SpO2: 94%  92% 94%  Weight:  75.9 kg    Height:        Intake/Output Summary (Last 24 hours) at 07/20/2019 0931 Last data filed at 07/19/2019 1842 Gross per 24 hour  Intake 720 ml  Output -  Net 720 ml    LABS: Basic Metabolic Panel: No results for input(s): NA, K, CL, CO2, GLUCOSE, BUN, CREATININE, CALCIUM, MG, PHOS in the last 72 hours. Liver Function Tests: No results for input(s): AST, ALT, ALKPHOS, BILITOT, PROT, ALBUMIN in the last 72 hours. No results for input(s): LIPASE, AMYLASE in the last 72 hours. CBC: Recent Labs    07/18/19 0553 07/19/19 0516  WBC 10.8* 9.8  HGB 10.0* 9.4*  HCT 31.7* 29.9*  MCV 89.3 87.7  PLT 329 352   Cardiac Enzymes: No results for input(s): CKTOTAL, CKMB, CKMBINDEX, TROPONINI in the last 72 hours. BNP: Invalid input(s): POCBNP D-Dimer: No results for input(s): DDIMER in the last 72 hours. Hemoglobin A1C: No results for input(s): HGBA1C in the last 72 hours. Fasting Lipid Panel: No results for input(s): CHOL, HDL, LDLCALC, TRIG, CHOLHDL, LDLDIRECT in the last 72 hours. Thyroid Function Tests: No results for input(s): TSH, T4TOTAL, T3FREE, THYROIDAB in the last 72 hours.  Invalid input(s): FREET3 Anemia Panel: No results for input(s): VITAMINB12, FOLATE, FERRITIN, TIBC, IRON, RETICCTPCT in the last 72 hours.   PHYSICAL EXAM General: Well developed, well nourished, in no acute distress HEENT:  Normocephalic and atramatic Neck:  No JVD.  Lungs: Clear bilaterally to auscultation and percussion. Heart: HRRR . Normal S1 and S2 without gallops or murmurs.  Abdomen: Bowel sounds are positive, abdomen soft and non-tender  Msk:   Back normal, normal gait. Normal strength and tone for age. Extremities: No clubbing, cyanosis or edema.   Neuro: Alert and oriented X 3. Psych:  Good affect, responds appropriately  TELEMETRY: Sinus rhythm 88bpm  ASSESSMENT AND PLAN: Status post pulmonary embolism without any hemodynamic compromise with some mild dilatation of RV with normal preserved left ventricular systolic function without any significant pulmonary hypertension.  Continue Eliquis and can be discharged with follow-up office on Monday 8/31 at 10:00.  Active Problems:   Pulmonary embolism ([facility])    Jake Bathe, MD, [facility] 07/20/2019 9:31 AM

## 2019-07-25 DIAGNOSIS — I2699 Other pulmonary embolism without acute cor pulmonale: Secondary | ICD-10-CM | POA: Insufficient documentation

## 2019-07-26 ENCOUNTER — Other Ambulatory Visit (INDEPENDENT_AMBULATORY_CARE_PROVIDER_SITE_OTHER): Payer: Self-pay | Admitting: Vascular Surgery

## 2019-07-26 ENCOUNTER — Other Ambulatory Visit: Payer: Self-pay

## 2019-07-26 DIAGNOSIS — I82402 Acute embolism and thrombosis of unspecified deep veins of left lower extremity: Secondary | ICD-10-CM

## 2019-07-27 ENCOUNTER — Inpatient Hospital Stay: Payer: Medicare PPO | Attending: Oncology | Admitting: Oncology

## 2019-07-27 ENCOUNTER — Other Ambulatory Visit: Payer: Self-pay

## 2019-07-27 ENCOUNTER — Telehealth: Payer: Self-pay | Admitting: Gastroenterology

## 2019-07-27 ENCOUNTER — Inpatient Hospital Stay: Payer: Medicare PPO | Admitting: Oncology

## 2019-07-27 ENCOUNTER — Encounter: Payer: Self-pay | Admitting: Oncology

## 2019-07-27 VITALS — BP 120/71 | HR 72 | Temp 99.6°F | Wt 166.4 lb

## 2019-07-27 DIAGNOSIS — I2699 Other pulmonary embolism without acute cor pulmonale: Secondary | ICD-10-CM

## 2019-07-27 DIAGNOSIS — K219 Gastro-esophageal reflux disease without esophagitis: Secondary | ICD-10-CM | POA: Diagnosis not present

## 2019-07-27 DIAGNOSIS — R5383 Other fatigue: Secondary | ICD-10-CM | POA: Diagnosis not present

## 2019-07-27 DIAGNOSIS — I129 Hypertensive chronic kidney disease with stage 1 through stage 4 chronic kidney disease, or unspecified chronic kidney disease: Secondary | ICD-10-CM | POA: Insufficient documentation

## 2019-07-27 DIAGNOSIS — E039 Hypothyroidism, unspecified: Secondary | ICD-10-CM | POA: Insufficient documentation

## 2019-07-27 DIAGNOSIS — Z86718 Personal history of other venous thrombosis and embolism: Secondary | ICD-10-CM

## 2019-07-27 DIAGNOSIS — Z7901 Long term (current) use of anticoagulants: Secondary | ICD-10-CM | POA: Insufficient documentation

## 2019-07-27 DIAGNOSIS — M1990 Unspecified osteoarthritis, unspecified site: Secondary | ICD-10-CM | POA: Diagnosis not present

## 2019-07-27 DIAGNOSIS — Z7982 Long term (current) use of aspirin: Secondary | ICD-10-CM | POA: Insufficient documentation

## 2019-07-27 DIAGNOSIS — D508 Other iron deficiency anemias: Secondary | ICD-10-CM | POA: Diagnosis not present

## 2019-07-27 DIAGNOSIS — N189 Chronic kidney disease, unspecified: Secondary | ICD-10-CM | POA: Insufficient documentation

## 2019-07-27 DIAGNOSIS — D473 Essential (hemorrhagic) thrombocythemia: Secondary | ICD-10-CM | POA: Insufficient documentation

## 2019-07-27 DIAGNOSIS — E669 Obesity, unspecified: Secondary | ICD-10-CM | POA: Diagnosis not present

## 2019-07-27 DIAGNOSIS — I82412 Acute embolism and thrombosis of left femoral vein: Secondary | ICD-10-CM | POA: Diagnosis not present

## 2019-07-27 DIAGNOSIS — Z79899 Other long term (current) drug therapy: Secondary | ICD-10-CM | POA: Diagnosis not present

## 2019-07-27 DIAGNOSIS — F329 Major depressive disorder, single episode, unspecified: Secondary | ICD-10-CM | POA: Insufficient documentation

## 2019-07-27 DIAGNOSIS — I82402 Acute embolism and thrombosis of unspecified deep veins of left lower extremity: Secondary | ICD-10-CM | POA: Insufficient documentation

## 2019-07-27 DIAGNOSIS — D75839 Thrombocytosis, unspecified: Secondary | ICD-10-CM

## 2019-07-27 DIAGNOSIS — D649 Anemia, unspecified: Secondary | ICD-10-CM

## 2019-07-27 HISTORY — DX: Personal history of other venous thrombosis and embolism: Z86.718

## 2019-07-27 LAB — CBC WITH DIFFERENTIAL/PLATELET
Abs Immature Granulocytes: 0.05 10*3/uL (ref 0.00–0.07)
Basophils Absolute: 0 10*3/uL (ref 0.0–0.1)
Basophils Relative: 1 %
Eosinophils Absolute: 0.2 10*3/uL (ref 0.0–0.5)
Eosinophils Relative: 2 %
HCT: 36.1 % (ref 36.0–46.0)
Hemoglobin: 11.1 g/dL — ABNORMAL LOW (ref 12.0–15.0)
Immature Granulocytes: 1 %
Lymphocytes Relative: 16 %
Lymphs Abs: 1.4 10*3/uL (ref 0.7–4.0)
MCH: 27.8 pg (ref 26.0–34.0)
MCHC: 30.7 g/dL (ref 30.0–36.0)
MCV: 90.3 fL (ref 80.0–100.0)
Monocytes Absolute: 0.7 10*3/uL (ref 0.1–1.0)
Monocytes Relative: 8 %
Neutro Abs: 6.2 10*3/uL (ref 1.7–7.7)
Neutrophils Relative %: 72 %
Platelets: 512 10*3/uL — ABNORMAL HIGH (ref 150–400)
RBC: 4 MIL/uL (ref 3.87–5.11)
RDW: 17.2 % — ABNORMAL HIGH (ref 11.5–15.5)
WBC: 8.5 10*3/uL (ref 4.0–10.5)
nRBC: 0 % (ref 0.0–0.2)

## 2019-07-27 LAB — FERRITIN: Ferritin: 36 ng/mL (ref 11–307)

## 2019-07-27 LAB — IRON AND TIBC
Iron: 25 ug/dL — ABNORMAL LOW (ref 28–170)
Saturation Ratios: 7 % — ABNORMAL LOW (ref 10.4–31.8)
TIBC: 341 ug/dL (ref 250–450)
UIBC: 316 ug/dL

## 2019-07-27 LAB — FOLATE: Folate: 15.7 ng/mL (ref >5.9)

## 2019-07-27 LAB — VITAMIN B12: Vitamin B-12: 593 pg/mL (ref 180–914)

## 2019-07-27 NOTE — Telephone Encounter (Signed)
Left vm to offer apt

## 2019-07-27 NOTE — Telephone Encounter (Signed)
-----   Message from Virgel Manifold, MD sent at 07/13/2019 12:12 PM EDT -----  Please set up clinic appointment with me in 6 weeks

## 2019-07-28 MED ORDER — FERROUS SULFATE 325 (65 FE) MG PO TBEC: 325.0000 mg | DELAYED_RELEASE_TABLET | Freq: Every day | ORAL | 3 refills | Status: DC

## 2019-07-28 NOTE — Progress Notes (Signed)
Hematology/Oncology Consult note [facility] Telephone:([phone] [phone]   Patient Care Team: Baxter Hire, MD as PCP - General (Internal Medicine)  REFERRING PROVIDER: Demetrios Loll, MD  CHIEF COMPLAINTS/REASON FOR VISIT:  Evaluation of pulmonary embolism.   HISTORY OF PRESENTING ILLNESS:  Erin Sutton is a 78 y.o. female who was seen in consultation at the request of Demetrios Loll, MD for evaluation of pulmonary embolism.  Patient was diagnosed with acute pulmonary embolism on 07/14/2019.  Patient presented to emergency room for dyspnea.  Upon arriving to the emergency room, patient was mildly tachypneic, no hypoxia or tachycardia.  She also had chest pain and elevated d-dimer.  CT PE angiogram 07/16/2019 showed bilateral pulmonary embolism with evidence of right heart strain.  Patient was started on heparin drip.  Further work-up showed left lower extremity femoral popliteal DVT extending into calf veins.  Echo was unremarkable.  Left ventricle has normal systolic function with an ejection fraction of 60 to 65%.  Patient was discharged on Eliquis. Patient denies any immobilization factors prior to the presentation, except that patient was hospitalized on 07/02/2019 and discharged on 07/06/2019 For hypokalemia and nausea, acute diverticulitis.  Patient received DVT prophylaxis during the hospitalization. She currently lives in [place].  She does not know her Eliquis dose. History of previous thrombosis event: Denies Family history of thrombosis: Denies  Age appropriate cancer screening:  Today she reports that shortness of breath has improved.  Feeling well. No bleeding events  Positive for easy bruising. Review of Systems  Constitutional: Positive for fatigue. Negative for appetite change, chills and fever.  HENT:   Negative for hearing loss and voice change.   Eyes: Negative for eye problems.  Respiratory: Negative for chest tightness and  cough.   Cardiovascular: Negative for chest pain.  Gastrointestinal: Negative for abdominal distention, abdominal pain and blood in stool.  Endocrine: Negative for hot flashes.  Genitourinary: Negative for difficulty urinating and frequency.   Musculoskeletal: Negative for arthralgias.  Skin: Negative for itching and rash.  Neurological: Negative for extremity weakness.  Hematological: Negative for adenopathy. Bruises/bleeds easily.  Psychiatric/Behavioral: Negative for confusion.    MEDICAL HISTORY:  Past Medical History:  Diagnosis Date   Anxiety    Arthritis    Chronic kidney disease    Constipation    GERD (gastroesophageal reflux disease)    Hypertension    Hypothyroidism    Osteoporosis    Sleep apnea    Urinary incontinence     SURGICAL HISTORY: Past Surgical History:  Procedure Laterality Date   BREAST BIOPSY Right    neg/stereo   BREAST BIOPSY Left    neg/stereo   BREAST EXCISIONAL BIOPSY Right    neg   CATARACT EXTRACTION, BILATERAL     COLONOSCOPY WITH PROPOFOL N/A 07/02/2015   Procedure: COLONOSCOPY WITH PROPOFOL;  Surgeon: Manya Silvas, MD;  Location: [facility];  Service: Endoscopy;  Laterality: N/A;   CORRECTION HAMMER TOE Right    ESOPHAGOGASTRODUODENOSCOPY (EGD) WITH PROPOFOL N/A 07/02/2015   Procedure: ESOPHAGOGASTRODUODENOSCOPY (EGD) WITH PROPOFOL;  Surgeon: Manya Silvas, MD;  Location: [facility] ENDOSCOPY;  Service: Endoscopy;  Laterality: N/A;   JOINT REPLACEMENT Left    knee   REPLACEMENT TOTAL KNEE Left    SKIN BIOPSY Right     SOCIAL HISTORY: Social History   Socioeconomic History   Marital status: Widowed    Spouse name: Not on file   Number of children: Not on file   Years of education:  Not on file   Highest education level: Not on file  Occupational History   Not on file  Social Needs   Financial resource strain: Not hard at all   Food insecurity    Worry: Patient refused    Inability: Patient  refused   Transportation needs    Medical: Patient refused    Non-medical: Patient refused  Tobacco Use   Smoking status: Never Smoker   Smokeless tobacco: Never Used  Substance and Sexual Activity   Alcohol use: No   Drug use: No   Sexual activity: Not Currently  Lifestyle   Physical activity    Days per week: Patient refused    Minutes per session: Patient refused   Stress: Only a little  Relationships   Social connections    Talks on phone: Patient refused    Gets together: Patient refused    Attends religious service: Patient refused    Active member of club or organization: Patient refused    Attends meetings of clubs or organizations: Patient refused    Relationship status: Patient refused   Intimate partner violence    Fear of current or ex partner: Patient refused    Emotionally abused: Patient refused    Physically abused: Patient refused    Forced sexual activity: Patient refused  Other Topics Concern   Not on file  Social History Narrative   Patient lives at home by herself.  Ambulates with a cane.   Has a daughter who lives in [place]: Family History  Problem Relation Age of Onset   Hypertension Mother    Diabetes Mother    Heart attack Mother    Asthma Mother    Peripheral vascular disease Mother    Heart attack Father    Hypertension Father    Diabetes Father    Colon cancer Maternal Aunt     ALLERGIES:  is allergic to ace inhibitors; amoxicillin; benicar [olmesartan]; codeine sulfate; levaquin [levofloxacin in d5w]; penicillin v potassium; and sulfa antibiotics.  MEDICATIONS:  Current Outpatient Medications  Medication Sig Dispense Refill   amLODipine (NORVASC) 10 MG tablet Take 10 mg by mouth daily.     apixaban (ELIQUIS) 5 MG TABS tablet 10 mg po bid for 5 days, then 5 mg po bid. 60 tablet 1   aspirin EC 81 MG tablet Take 81 mg by mouth daily.     carvedilol (COREG) 12.5 MG tablet Take 12.5 mg  by mouth 2 (two) times daily.     cholecalciferol (VITAMIN D3) 25 MCG (1000 UT) tablet Take 1,000 Units by mouth daily.     cyanocobalamin (,VITAMIN B-12,) 1000 MCG/ML injection Inject 1,000 mcg into the muscle every 30 (thirty) days.     docusate sodium (COLACE) 100 MG capsule Take 1 capsule (100 mg total) by mouth 2 (two) times daily. 60 capsule 2   feeding supplement, ENSURE ENLIVE, (ENSURE ENLIVE) LIQD Take 237 mLs by mouth 2 (two) times daily between meals. 237 mL 12   isosorbide mononitrate (IMDUR) 30 MG 24 hr tablet Take 30 mg by mouth daily.     levothyroxine (SYNTHROID, LEVOTHROID) 50 MCG tablet Take 50 mcg by mouth daily before breakfast.     LORazepam (ATIVAN) 2 MG tablet Take 2 mg by mouth every 8 (eight) hours as needed for anxiety.      magnesium oxide (MAG-OX) 400 MG tablet Take 400 mg by mouth daily.     ondansetron (ZOFRAN) 8 MG tablet Take 8  mg by mouth every 8 (eight) hours as needed.     pantoprazole (PROTONIX) 40 MG tablet Take 40 mg by mouth daily.     Polyethyl Glycol-Propyl Glycol (SYSTANE OP) Place 1 drop into both eyes 2 (two) times daily.     potassium chloride SA (K-DUR,KLOR-CON) 20 MEQ tablet Take 20 mEq by mouth every morning.     pravastatin (PRAVACHOL) 40 MG tablet Take 40 mg by mouth at bedtime.      sertraline (ZOLOFT) 100 MG tablet Take 100 mg by mouth at bedtime.      simethicone (MYLICON) 80 MG chewable tablet Chew 1 tablet (80 mg total) by mouth every 6 (six) hours as needed for flatulence. 30 tablet 0   No current facility-administered medications for this visit.      PHYSICAL EXAMINATION: ECOG PERFORMANCE STATUS: 1 - Symptomatic but completely ambulatory Vitals:   07/27/19 1604  BP: 120/71  Pulse: 72  Temp: 99.6 F (37.6 C)   Filed Weights   07/27/19 1604  Weight: 166 lb 6 oz (75.5 kg)    Physical Exam Constitutional:      General: She is not in acute distress.    Appearance: She is obese.     Comments: Sitting in  wheelchair.  HENT:     Head: Normocephalic and atraumatic.  Eyes:     General: No scleral icterus.    Pupils: Pupils are equal, round, and reactive to light.  Neck:     Musculoskeletal: Normal range of motion and neck supple.  Cardiovascular:     Rate and Rhythm: Normal rate and regular rhythm.     Heart sounds: Normal heart sounds.  Pulmonary:     Effort: Pulmonary effort is normal. No respiratory distress.     Breath sounds: No wheezing.  Abdominal:     General: Bowel sounds are normal. There is no distension.     Palpations: Abdomen is soft. There is no mass.     Tenderness: There is no abdominal tenderness.  Musculoskeletal: Normal range of motion.        General: No deformity.  Skin:    General: Skin is warm and dry.     Findings: No erythema or rash.  Neurological:     Mental Status: She is alert and oriented to person, place, and time.     Cranial Nerves: No cranial nerve deficit.     Coordination: Coordination normal.  Psychiatric:        Behavior: Behavior normal.        Thought Content: Thought content normal.     RADIOGRAPHIC STUDIES: I have personally reviewed the radiological images as listed and agreed with the findings in the report. Ct Head Wo Contrast  Result Date: 07/02/2019 CLINICAL DATA:  Two weeks of nausea with some nystagmus.  Dizziness. EXAM: CT HEAD WITHOUT CONTRAST TECHNIQUE: Contiguous axial images were obtained from the base of the skull through the vertex without intravenous contrast. COMPARISON:  CT head dated June 30, 2018. FINDINGS: Brain: No evidence of acute infarction, hemorrhage, hydrocephalus, extra-axial collection or mass lesion/mass effect. Vascular: No hyperdense vessel or unexpected calcification. Skull: Normal. Negative for fracture or focal lesion. Sinuses/Orbits: No acute finding. Other: None. IMPRESSION: No acute intracranial abnormality. Electronically Signed   By: Constance Holster M.D.   On: 07/02/2019 17:02   Ct Angio Chest Pe  W And/or Wo Contrast  Result Date: 07/16/2019 CLINICAL DATA:  Shortness of breath and pleuritic chest pain. EXAM: CT ANGIOGRAPHY CHEST WITH CONTRAST TECHNIQUE:  Multidetector CT imaging of the chest was performed using the standard protocol during bolus administration of intravenous contrast. Multiplanar CT image reconstructions and MIPs were obtained to evaluate the vascular anatomy. CONTRAST:  56mL OMNIPAQUE IOHEXOL 350 MG/ML SOLN COMPARISON:  Radiograph yesterday. FINDINGS: Cardiovascular: Examination is positive for acute pulmonary embolism with filling defects in the bilateral main pulmonary arteries extending into lobar and segmental branches. Thromboembolic burden is greater on the right and in the lower lobe. Mild straightening of the intraventricular septum with RV to LV ratio of 1. Thoracic aortic atherosclerosis without dissection. Conventional branching pattern from the aortic arch. Coronary artery calcifications. Mediastinum/Nodes: No enlarged mediastinal or hilar lymph nodes. Thyroid gland not well visualized. Esophagus is decompressed. Lungs/Pleura: Peripheral ground-glass opacity in the right lower lobe consistent with pulmonary infarct. Small right pleural effusion with adjacent compressive atelectasis. Scattered linear atelectasis in the left lung. No pulmonary edema. Upper Abdomen: No acute findings. Musculoskeletal: There are no acute or suspicious osseous abnormalities. Multilevel degenerative change throughout the spine. Degenerative change of both shoulders. Review of the MIP images confirms the above findings. IMPRESSION: 1. Positive for acute pulmonary embolism with CT evidence of right heart strain. Filling defects in bilateral main pulmonary arteries extending into lobar and multiple segmental branches, greater on the right. Thromboembolic burden is moderate to large. 2. Right lower lobe pulmonary infarct. Small right pleural effusion with adjacent compressive atelectasis. 3. Aortic  Atherosclerosis (ICD10-I70.0). Coronary artery calcifications. Critical Value/emergent results were called by telephone at the time of interpretation on 07/16/2019 at 1:08 am to Dr. Harvest Dark, who verbally acknowledged these results. Electronically Signed   By: Keith Rake M.D.   On: 07/16/2019 01:09   Mr Brain Wo Contrast  Result Date: 07/02/2019 CLINICAL DATA:  Initial evaluation for acute nausea. EXAM: MRI HEAD WITHOUT CONTRAST TECHNIQUE: Multiplanar, multiecho pulse sequences of the brain and surrounding structures were obtained without intravenous contrast. COMPARISON:  Prior head CT from earlier the same day. FINDINGS: Brain: Cerebral volume within normal limits. No significant cerebral white matter disease for age. Small focus of encephalomalacia and gliosis involving the parasagittal left parieto-occipital region compatible with chronic ischemic infarct. Additional tiny remote lacunar infarcts noted involving the bilateral thalami. No abnormal foci of restricted diffusion to suggest acute or subacute ischemia. Gray-white matter differentiation otherwise maintained. No other areas of remote cortical infarction. No acute intracranial hemorrhage. Single chronic microhemorrhage noted at the anterior left frontal region, of doubtful significance in isolation. No mass lesion, midline shift or mass effect. No hydrocephalus. No extra-axial fluid collection. Pituitary gland suprasellar region normal. Midline structures intact. Vascular: Major intracranial vascular flow voids are maintained. Skull and upper cervical spine: Craniocervical junction within normal limits. Upper cervical spine normal. Bone marrow signal intensity within normal limits. No scalp soft tissue abnormality. Sinuses/Orbits: Patient status post bilateral ocular lens replacement. Globes and orbital soft tissues demonstrate no acute finding. Paranasal sinuses are clear. No mastoid effusion. Inner ear structures normal. Other: None.  IMPRESSION: 1. No acute intracranial infarct or other abnormality identified. 2. Small remote left parieto-occipital cortical infarct, with additional tiny remote bilateral thalamic lacunar infarcts. 3. Otherwise normal brain MRI for age. Electronically Signed   By: Jeannine Boga M.D.   On: 07/02/2019 21:03   Ct Abdomen Pelvis W Contrast  Result Date: 07/02/2019 CLINICAL DATA:  Prior diverticulitis with continued nausea. EXAM: CT ABDOMEN AND PELVIS WITH CONTRAST TECHNIQUE: Multidetector CT imaging of the abdomen and pelvis was performed using the standard protocol following bolus administration of  intravenous contrast. CONTRAST:  137mL OMNIPAQUE IOHEXOL 300 MG/ML  SOLN COMPARISON:  November 08, 2015 FINDINGS: Lower chest: The lung bases are clear. The heart size is normal. Hepatobiliary: The liver is normal. Normal gallbladder.There is no biliary ductal dilation. Pancreas: Normal contours without ductal dilatation. No peripancreatic fluid collection. Spleen: No splenic laceration or hematoma. Adrenals/Urinary Tract: --Adrenal glands: No adrenal hemorrhage. --Right kidney/ureter: No hydronephrosis or perinephric hematoma. --Left kidney/ureter: No hydronephrosis or perinephric hematoma. --Urinary bladder: There is bladder wall thickening despite the degree of underdistention. Stomach/Bowel: --Stomach/Duodenum: No hiatal hernia or other gastric abnormality. Normal duodenal course and caliber. --Small bowel: No dilatation or inflammation. --Colon: There is sigmoid diverticulosis with evidence of early sigmoid diverticulitis. --Appendix: Normal. Vascular/Lymphatic: Atherosclerotic calcification is present within the non-aneurysmal abdominal aorta, without hemodynamically significant stenosis. --No retroperitoneal lymphadenopathy. --No mesenteric lymphadenopathy. --No pelvic or inguinal lymphadenopathy. Reproductive: Again noted is an abnormal appearance of the uterus favored to represent fibroids. Other: No  ascites or free air. The abdominal wall is normal. Musculoskeletal. Multilevel degenerative disc disease and facet arthrosis. No bony spinal canal stenosis. IMPRESSION: 1. Sigmoid diverticulosis with evidence for early uncomplicated sigmoid diverticulitis. 2. Mild bladder wall thickening. Correlation with urinalysis is recommended to help exclude an underlying cystitis. Electronically Signed   By: Constance Holster M.D.   On: 07/02/2019 16:57   US Venous Img Lower Bilateral  Result Date: 07/16/2019 CLINICAL DATA:  Pulmonary emboli, leg pain EXAM: BILATERAL LOWER EXTREMITY VENOUS DOPPLER ULTRASOUND TECHNIQUE: Gray-scale sonography with graded compression, as well as color Doppler and duplex ultrasound were performed to evaluate the lower extremity deep venous systems from the level of the common femoral vein and including the common femoral, femoral, profunda femoral, popliteal and calf veins including the posterior tibial, peroneal and gastrocnemius veins when visible. The superficial great saphenous vein was also interrogated. Spectral Doppler was utilized to evaluate flow at rest and with distal augmentation maneuvers in the common femoral, femoral and popliteal veins. COMPARISON:  None. FINDINGS: RIGHT LOWER EXTREMITY Common Femoral Vein: No evidence of thrombus. Normal compressibility, respiratory phasicity and response to augmentation. Saphenofemoral Junction: No evidence of thrombus. Normal compressibility and flow on color Doppler imaging. Profunda Femoral Vein: No evidence of thrombus. Normal compressibility and flow on color Doppler imaging. Femoral Vein: No evidence of thrombus. Normal compressibility, respiratory phasicity and response to augmentation. Popliteal Vein: No evidence of thrombus. Normal compressibility, respiratory phasicity and response to augmentation. Calf Veins: No evidence of thrombus. Normal compressibility and flow on color Doppler imaging. Superficial Great Saphenous Vein: No  evidence of thrombus. Normal compressibility. Venous Reflux:  Not assessed Other Findings:  None. LEFT LOWER EXTREMITY Common Femoral Vein: Positive for hypoechoic thrombus. Vessel is noncompressible. Thrombus partially occlusive. Saphenofemoral Junction: Femoral thrombus extends into the saphenofemoral junction, partially occlusive. Profunda Femoral Vein: No evidence of thrombus. Normal compressibility and flow on color Doppler imaging. Femoral Vein: Hypoechoic thrombus. Vessel is noncompressible. Thrombus appears occlusive throughout the femoral vein. Popliteal Vein: Hypoechoic thrombus. Vessel is also noncompressible. Thrombus appears occlusive. Calf Veins: Thrombus does appear to extend into the tibial and peroneal calf veins inferiorly. Superficial Great Saphenous Vein: No evidence of thrombus. Normal compressibility. Venous Reflux:  Not assessed Other Findings:  None. IMPRESSION: Positive exam for left femoropopliteal DVT extending into the calf veins. Negative for right lower extremity DVT. These results will be called to the ordering clinician or representative by the Radiologist Assistant, and communication documented in the PACS or zVision Dashboard. Electronically Signed   By: Jerilynn Mages.  Shick M.D.   On: 07/16/2019 12:50   Dg Chest Portable 1 View  Result Date: 07/15/2019 CLINICAL DATA:  Shortness of breath. EXAM: PORTABLE CHEST 1 VIEW COMPARISON:  Radiograph 09/02/2018 FINDINGS: Chronic elevation of right hemidiaphragm. Unchanged heart size and mediastinal contours. Aortic atherosclerosis. Streaky bibasilar opacities without confluent airspace disease. No pleural effusion or pneumothorax. Degenerative change of the left shoulder. IMPRESSION: Streaky bibasilar opacities. Aortic Atherosclerosis (ICD10-I70.0). Electronically Signed   By: Keith Rake M.D.   On: 07/15/2019 20:43     LABORATORY DATA:  I have reviewed the data as listed Lab Results  Component Value Date   WBC 8.5 07/27/2019   HGB 11.1  (L) 07/27/2019   HCT 36.1 07/27/2019   MCV 90.3 07/27/2019   PLT 512 (H) 07/27/2019   Recent Labs    09/02/18 1604 07/02/19 1435 07/03/19 0500 07/05/19 1251 07/15/19 2021  NA 139 138 138 137 135  K 4.0 3.3* 3.6 3.4* 3.9  CL 104 102 105 105 99  CO2 25 24 27 23 26   GLUCOSE 114* 114* 97 172* 121*  BUN 14 19 16 12 11   CREATININE 0.96 0.77 0.92 0.88 0.77  CALCIUM 10.3 9.8 8.9 9.1 9.3  GFRNONAA 56* >60 >60 >60 >60  GFRAA >60 >60 >60 >60 >60  PROT 7.8 7.4  --   --  6.6  ALBUMIN 4.2 4.3  --   --  3.0*  AST 20 23  --   --  33  ALT 9 10  --   --  13  ALKPHOS 54 47  --   --  53  BILITOT 0.7 0.7  --   --  0.7  BILIDIR <0.1  --   --   --   --   IBILI NOT CALCULATED  --   --   --   --    Iron/TIBC/Ferritin/ %Sat    Component Value Date/Time   IRON 25 (L) 07/27/2019 1548   TIBC 341 07/27/2019 1548   FERRITIN 36 07/27/2019 1548   IRONPCTSAT 7 (L) 07/27/2019 1548       ASSESSMENT & PLAN:  1. Other acute pulmonary embolism without acute cor pulmonale ([facility])   2. Chronic anticoagulation   3. Acute deep vein thrombosis (DVT) of femoral vein of left lower extremity (HCC)   4. Normocytic anemia   5. Thrombocytosis (HCC)   6. Other iron deficiency anemia    Labs are reviewed and discussed with patient.  Images were intubated reviewed and discussed. Acute bilateral PE as well as left lower extremity proximal DVT. Agree with anticoagulation with Eliquis 5 mg twice daily. Patient was hospitalized 2 weeks prior to development of acute PE and DVT.  This could be a provoked event. She also has other immobilization factors including obesity, advanced age, decreased physical activity. Her facility medication list showed Eliquis 5 mg once daily.  I asked CMA Almyra Free to call the facility to clarify that her Eliquis need to be 5 mg twice daily.  Recommend long-term anticoagulation.  After she finishes 6 months of full anticoagulation, recommend switch to Eliquis 2.5 mg twice daily for  maintenance. Patient is also on aspirin 81 mg, no history of drug-eluting stent.  Advised patient to stop aspirin 81 mg to decrease bleeding risk.  Normocytic anemia with a hemoglobin 9.4 on discharge on 07/19/2019. I will repeat a CBC.  Iron TIBC ferritin, folate and B12. Labs are reviewed.  Hemoglobin is improving.  Iron panel showed iron deficiency.  Will advise  patient to start taking oral iron supplementation.  Ferrous sulfate 325 mg daily. Normal vitamin B12 and folate level.  Thrombocytosis, likely reactive.  Orders Placed This Encounter  Procedures   CBC with Differential/Platelet    Standing Status:   Future    Number of Occurrences:   1    Standing Expiration Date:   07/26/2020   Iron and TIBC    Standing Status:   Future    Number of Occurrences:   1    Standing Expiration Date:   07/26/2020   Ferritin    Standing Status:   Future    Number of Occurrences:   1    Standing Expiration Date:   07/26/2020   Vitamin B12   Folate    Standing Status:   Future    Number of Occurrences:   1    Standing Expiration Date:   07/26/2020    All questions were answered. The patient knows to call the clinic with any problems questions or concerns.  Cc Demetrios Loll, MD   Return of visit: 3 months. Thank you for this kind referral and the opportunity to participate in the care of this patient. A copy of today's note is routed to referring provider  Total face to face encounter time for this patient visit was 45 min. >50% of the time was  spent in counseling and coordination of care.    Earlie Server, MD, PhD 07/28/2019

## 2019-07-29 ENCOUNTER — Telehealth: Payer: Self-pay

## 2019-07-29 ENCOUNTER — Ambulatory Visit (INDEPENDENT_AMBULATORY_CARE_PROVIDER_SITE_OTHER): Payer: Medicare PPO

## 2019-07-29 ENCOUNTER — Encounter (INDEPENDENT_AMBULATORY_CARE_PROVIDER_SITE_OTHER): Payer: Self-pay | Admitting: Vascular Surgery

## 2019-07-29 ENCOUNTER — Other Ambulatory Visit: Payer: Self-pay

## 2019-07-29 ENCOUNTER — Ambulatory Visit (INDEPENDENT_AMBULATORY_CARE_PROVIDER_SITE_OTHER): Payer: Medicare PPO | Admitting: Vascular Surgery

## 2019-07-29 VITALS — BP 178/91 | HR 79 | Resp 16 | Ht 60.0 in | Wt 166.0 lb

## 2019-07-29 DIAGNOSIS — I2699 Other pulmonary embolism without acute cor pulmonale: Secondary | ICD-10-CM

## 2019-07-29 DIAGNOSIS — I10 Essential (primary) hypertension: Secondary | ICD-10-CM | POA: Diagnosis not present

## 2019-07-29 DIAGNOSIS — I82402 Acute embolism and thrombosis of unspecified deep veins of left lower extremity: Secondary | ICD-10-CM

## 2019-07-29 DIAGNOSIS — I82412 Acute embolism and thrombosis of left femoral vein: Secondary | ICD-10-CM | POA: Diagnosis not present

## 2019-07-29 NOTE — Telephone Encounter (Signed)
Patient informed of MD advice.  Advised her if she has any problems with the ferrous sulfate to let us know.

## 2019-07-29 NOTE — Assessment & Plan Note (Signed)
Tolerating anticoagulation with minimal symptoms at this point even though it was a fairly large PE.  Continue anticoagulation

## 2019-07-29 NOTE — Patient Instructions (Signed)

## 2019-07-29 NOTE — Telephone Encounter (Signed)
-----   Message from Earlie Server, MD sent at 07/29/2019  8:29 AM EDT -----  ----- Message ----- From: Earlie Server, MD Sent: 07/28/2019  11:30 PM EDT To: Vernetta Honey, CMA  Please advise patient to stop Aspirin 81mg   Also advise patient to start ferrous sulfate 325mg  daily. Rx sent to pharmacy. Thanks. Follow up as planned.

## 2019-07-29 NOTE — Progress Notes (Signed)
MRN : [id number]  Erin Sutton is a 78 y.o. ([birth year]) female who presents with chief complaint of  Chief Complaint  Patient presents with   New Patient (Initial Visit)    [facility] ED le dvt  .  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  She is tolerating anticoagulation and seems to be doing okay.  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  Given her age and comorbidities in a nursing home that was certainly a reasonable option.  She says she is breathing reasonably well without any major complaints.  No current chest pain.  Her leg swelling is fairly mild at this point.  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  Current Outpatient Medications  Medication Sig Dispense Refill   amLODipine (NORVASC) 10 MG tablet Take 10 mg by mouth daily.     apixaban (ELIQUIS) 5 MG TABS tablet 10 mg po bid for 5 days, then 5 mg po bid. 60 tablet 1   aspirin EC 81 MG tablet Take 81 mg by mouth daily.     carvedilol (COREG) 12.5 MG tablet Take 12.5 mg by mouth 2 (two) times daily.     cholecalciferol (VITAMIN D3) 25 MCG (1000 UT) tablet Take 1,000 Units by mouth daily.     cyanocobalamin (,VITAMIN B-12,) 1000 MCG/ML injection Inject 1,000 mcg into the muscle every 30 (thirty) days.     docusate sodium (COLACE) 100 MG capsule Take 1 capsule (100 mg total) by mouth 2 (two) times daily. 60 capsule 2   feeding supplement, ENSURE ENLIVE, (ENSURE ENLIVE) LIQD Take 237 mLs by mouth 2 (two) times daily between meals. 237 mL 12   ferrous sulfate 325 (65 FE) MG EC tablet Take 1 tablet (325 mg total) by mouth daily with breakfast. 30 tablet 3   isosorbide mononitrate (IMDUR) 30 MG 24 hr tablet Take 30 mg by mouth daily.     levothyroxine (SYNTHROID, LEVOTHROID) 50 MCG tablet Take 50 mcg by mouth daily before  breakfast.     LORazepam (ATIVAN) 2 MG tablet Take 2 mg by mouth every 8 (eight) hours as needed for anxiety.      magnesium oxide (MAG-OX) 400 MG tablet Take 400 mg by mouth daily.     ondansetron (ZOFRAN) 8 MG tablet Take 8 mg by mouth every 8 (eight) hours as needed.     pantoprazole (PROTONIX) 40 MG tablet Take 40 mg by mouth daily.     Polyethyl Glycol-Propyl Glycol (SYSTANE OP) Place 1 drop into both eyes 2 (two) times daily.     potassium chloride SA (K-DUR,KLOR-CON) 20 MEQ tablet Take 20 mEq by mouth every morning.     pravastatin (PRAVACHOL) 40 MG tablet Take 40 mg by mouth at bedtime.      sertraline (ZOLOFT) 100 MG tablet Take 100 mg by mouth at bedtime.      simethicone (MYLICON) 80 MG chewable tablet Chew 1 tablet (80 mg total) by mouth every 6 (six) hours as needed for flatulence. 30 tablet 0   No current facility-administered medications for this visit.     Past Medical History:  Diagnosis Date   Anxiety    Arthritis    Chronic kidney disease    Constipation    GERD (gastroesophageal reflux disease)    Hypertension  Hypothyroidism    Osteoporosis    Sleep apnea    Urinary incontinence     Past Surgical History:  Procedure Laterality Date   BREAST BIOPSY Right    neg/stereo   BREAST BIOPSY Left    neg/stereo   BREAST EXCISIONAL BIOPSY Right    neg   CATARACT EXTRACTION, BILATERAL     COLONOSCOPY WITH PROPOFOL N/A 07/02/2015   Procedure: COLONOSCOPY WITH PROPOFOL;  Surgeon: Manya Silvas, MD;  Location: [facility] ENDOSCOPY;  Service: Endoscopy;  Laterality: N/A;   CORRECTION HAMMER TOE Right    ESOPHAGOGASTRODUODENOSCOPY (EGD) WITH PROPOFOL N/A 07/02/2015   Procedure: ESOPHAGOGASTRODUODENOSCOPY (EGD) WITH PROPOFOL;  Surgeon: Manya Silvas, MD;  Location: [facility] ENDOSCOPY;  Service: Endoscopy;  Laterality: N/A;   JOINT REPLACEMENT Left    knee   REPLACEMENT TOTAL KNEE Left    SKIN BIOPSY Right     Social History Social History    Tobacco Use   Smoking status: Never Smoker   Smokeless tobacco: Never Used  Substance Use Topics   Alcohol use: No   Drug use: No  Lives in a skilled nursing facility  Family History Family History  Problem Relation Age of Onset   Hypertension Mother    Diabetes Mother    Heart attack Mother    Asthma Mother    Peripheral vascular disease Mother    Heart attack Father    Hypertension Father    Diabetes Father    Colon cancer Maternal Aunt     Allergies  Allergen Reactions   Ace Inhibitors Cough   Levofloxacin    Penicillin G Other (See Comments)   Tape Other (See Comments)    Other reaction(s): Other (See Comments)   Amoxicillin Rash    Has patient had a PCN reaction causing immediate rash, facial/tongue/throat swelling, SOB or lightheadedness with hypotension: No Has patient had a PCN reaction causing severe rash involving mucus membranes or skin necrosis: No Has patient had a PCN reaction that required hospitalization: No Has patient had a PCN reaction occurring within the last 10 years: No If all of the above answers are "NO", then may proceed with Cephalosporin use.    Benicar [Olmesartan] Rash    Itching rash   Codeine Rash   Codeine Sulfate Rash   Levaquin [Levofloxacin In D5w] Rash    Alters mental status   Penicillin V Potassium Rash    Has patient had a PCN reaction causing immediate rash, facial/tongue/throat swelling, SOB or lightheadedness with hypotension: No Has patient had a PCN reaction causing severe rash involving mucus membranes or skin necrosis: No Has patient had a PCN reaction that required hospitalization: No Has patient had a PCN reaction occurring within the last 10 years: No If all of the above answers are "NO", then may proceed with Cephalosporin use.    Sulfa Antibiotics Rash     REVIEW OF SYSTEMS (Negative unless checked)  Constitutional: [] Weight loss  [] Fever  [] Chills Cardiac: [] Chest pain   [] Chest  pressure   [] Palpitations   [] Shortness of breath when laying flat   [] Shortness of breath at rest   [x] Shortness of breath with exertion. Vascular:  [] Pain in legs with walking   [] Pain in legs at rest   [] Pain in legs when laying flat   [] Claudication   [] Pain in feet when walking  [] Pain in feet at rest  [] Pain in feet when laying flat   [x] History of DVT   [x] Phlebitis   [x] Swelling in legs   []   Varicose veins   [] Non-healing ulcers Pulmonary:   [] Uses home oxygen   [] Productive cough   [] Hemoptysis   [] Wheeze  [] COPD   [] Asthma Neurologic:  [] Dizziness  [] Blackouts   [] Seizures   [] History of stroke   [] History of TIA  [] Aphasia   [] Temporary blindness   [] Dysphagia   [] Weakness or numbness in arms   [] Weakness or numbness in legs Musculoskeletal:  [x] Arthritis   [] Joint swelling   [x] Joint pain   [] Low back pain Hematologic:  [] Easy bruising  [] Easy bleeding   [] Hypercoagulable state   [] Anemic   Gastrointestinal:  [] Blood in stool   [] Vomiting blood  [x] Gastroesophageal reflux/heartburn   [] Abdominal pain Genitourinary:  [] Chronic kidney disease   [] Difficult urination  [] Frequent urination  [] Burning with urination   [] Hematuria Skin:  [] Rashes   [] Ulcers   [] Wounds Psychological:  [] History of anxiety   []  History of major depression.  Physical Examination  BP (!) 178/91 (BP Location: Right Arm)    Pulse 79    Resp 16    Ht 5' (1.524 m)    Wt 166 lb (75.3 kg)    BMI 32.42 kg/m  Gen:  WD/WN, NAD Head: [place]/AT, No temporalis wasting. Ear/Nose/Throat: Hearing grossly intact, nares w/o erythema or drainage Eyes: Conjunctiva clear. Sclera non-icteric Neck: Supple.  Trachea midline Pulmonary:  Good air movement, no use of accessory muscles.  Cardiac: RRR, no JVD Vascular:  Vessel Right Left  Radial Palpable Palpable                          PT  1+ palpable  1+ palpable  DP Palpable  1+ palpable   Gastrointestinal: soft, non-tender/non-distended. No guarding/reflex.    Musculoskeletal: M/S 5/5 throughout.  No deformity or atrophy.  1+ left lower extremity edema.  In a wheelchair Neurologic: Sensation grossly intact in extremities.  Symmetrical.  Speech is fluent.  Psychiatric: Judgment intact, Mood & affect appropriate for pt's clinical situation. Dermatologic: No rashes or ulcers noted.  No cellulitis or open wounds.       Labs Recent Results (from the past 2160 hour(s))  Lipase, blood     Status: None   Collection Time: 07/02/19  2:35 PM  Result Value Ref Range   Lipase 28 11 - 51 U/L    Comment: Performed at [facility], [address] [place], [place] [address]  Comprehensive metabolic panel     Status: Abnormal   Collection Time: 07/02/19  2:35 PM  Result Value Ref Range   Sodium 138 135 - 145 mmol/L   Potassium 3.3 (L) 3.5 - 5.1 mmol/L   Chloride 102 98 - 111 mmol/L   CO2 24 22 - 32 mmol/L   Glucose, Bld 114 (H) 70 - 99 mg/dL   BUN 19 8 - 23 mg/dL   Creatinine, Ser 0.77 0.44 - 1.00 mg/dL   Calcium 9.8 8.9 - 10.3 mg/dL   Total Protein 7.4 6.5 - 8.1 g/dL   Albumin 4.3 3.5 - 5.0 g/dL   AST 23 15 - 41 U/L   ALT 10 0 - 44 U/L   Alkaline Phosphatase 47 38 - 126 U/L   Total Bilirubin 0.7 0.3 - 1.2 mg/dL   GFR calc non Af Amer >60 >60 mL/min   GFR calc Af Amer >60 >60 mL/min   Anion gap 12 5 - 15    Comment: Performed at [facility], [address] [place], [place] [address]  CBC     Status: Abnormal   Collection Time: 07/02/19  2:35 PM  Result Value Ref Range   WBC 9.1 4.0 - 10.5 K/uL   RBC 4.53 3.87 - 5.11 MIL/uL   Hemoglobin 12.4 12.0 - 15.0 g/dL   HCT 38.9 36.0 - 46.0 %   MCV 85.9 80.0 - 100.0 fL   MCH 27.4 26.0 - 34.0 pg   MCHC 31.9 30.0 - 36.0 g/dL   RDW 16.6 (H) 11.5 - 15.5 %   Platelets 254 150 - 400 K/uL   nRBC 0.0 0.0 - 0.2 %    Comment: Performed at [facility], [place], [place] [address]  Troponin I (High Sensitivity)     Status: None   Collection Time: 07/02/19   2:35 PM  Result Value Ref Range   Troponin I (High Sensitivity) 7 <18 ng/L    Comment: (NOTE) Elevated high sensitivity troponin I (hsTnI) values and significant  changes across serial measurements may suggest ACS but many other  chronic and acute conditions are known to elevate hsTnI results.  Refer to the "Links" section for chest pain algorithms and additional  guidance. Performed at [facility], [address] [place], [place] [address]   Magnesium     Status: None   Collection Time: 07/02/19  2:35 PM  Result Value Ref Range   Magnesium 1.8 1.7 - 2.4 mg/dL    Comment: Performed at [facility], [address] [place], [place] [address]  T4, free     Status: Abnormal   Collection Time: 07/02/19  2:35 PM  Result Value Ref Range   Free T4 1.34 (H) 0.61 - 1.12 ng/dL    Comment: (NOTE) Biotin ingestion may interfere with free T4 tests. If the results are inconsistent with the TSH level, previous test results, or the clinical presentation, then consider biotin interference. If needed, order repeat testing after stopping biotin. Performed at [facility], [address] [place], [place] [address]   TSH     Status: None   Collection Time: 07/02/19  2:35 PM  Result Value Ref Range   TSH 1.745 0.350 - 4.500 uIU/mL    Comment: Performed by a 3rd Generation assay with a functional sensitivity of <=0.01 uIU/mL. Performed at [facility], [address] [place], [place] [address]   Urinalysis, Routine w reflex microscopic     Status: Abnormal   Collection Time: 07/02/19  3:14 PM  Result Value Ref Range   Color, Urine STRAW (A) YELLOW   APPearance CLEAR (A) CLEAR   Specific Gravity, Urine 1.025 1.005 - 1.030   pH 7.0 5.0 - 8.0   Glucose, UA NEGATIVE NEGATIVE mg/dL   Hgb urine dipstick SMALL (A) NEGATIVE   Bilirubin Urine NEGATIVE NEGATIVE   Ketones, ur 5 (A) NEGATIVE mg/dL   Protein, ur NEGATIVE NEGATIVE mg/dL   Nitrite NEGATIVE  NEGATIVE   Leukocytes,Ua NEGATIVE NEGATIVE   RBC / HPF 0-5 0 - 5 RBC/hpf   WBC, UA 0-5 0 - 5 WBC/hpf   Bacteria, UA NONE SEEN NONE SEEN   Squamous Epithelial / LPF NONE SEEN 0 - 5    Comment: Performed at [facility], [address], [place], [place] [address]  SARS Coronavirus 2 [facility] order, Performed in [facility] hospital lab) Nasopharyngeal Nasopharyngeal Swab     Status: None   Collection Time: 07/02/19  3:53 PM   Specimen: Nasopharyngeal Swab  Result Value Ref Range  SARS Coronavirus 2 NEGATIVE NEGATIVE    Comment: (NOTE) If result is NEGATIVE SARS-CoV-2 target nucleic acids are NOT DETECTED. The SARS-CoV-2 RNA is generally detectable in upper and lower  respiratory specimens during the acute phase of infection. The lowest  concentration of SARS-CoV-2 viral copies this assay can detect is 250  copies / mL. A negative result does not preclude SARS-CoV-2 infection  and should not be used as the sole basis for treatment or other  patient management decisions.  A negative result may occur with  improper specimen collection / handling, submission of specimen other  than nasopharyngeal swab, presence of viral mutation(s) within the  areas targeted by this assay, and inadequate number of viral copies  (<250 copies / mL). A negative result must be combined with clinical  observations, patient history, and epidemiological information. If result is POSITIVE SARS-CoV-2 target nucleic acids are DETECTED. The SARS-CoV-2 RNA is generally detectable in upper and lower  respiratory specimens dur ing the acute phase of infection.  Positive  results are indicative of active infection with SARS-CoV-2.  Clinical  correlation with patient history and other diagnostic information is  necessary to determine patient infection status.  Positive results do  not rule out bacterial infection or co-infection with other viruses. If result is PRESUMPTIVE POSTIVE SARS-CoV-2 nucleic acids  MAY BE PRESENT.   A presumptive positive result was obtained on the submitted specimen  and confirmed on repeat testing.  While 2019 novel coronavirus  (SARS-CoV-2) nucleic acids may be present in the submitted sample  additional confirmatory testing may be necessary for epidemiological  and / or clinical management purposes  to differentiate between  SARS-CoV-2 and other Sarbecovirus currently known to infect humans.  If clinically indicated additional testing with an alternate test  methodology [phone]) is advised. The SARS-CoV-2 RNA is generally  detectable in upper and lower respiratory sp ecimens during the acute  phase of infection. The expected result is Negative. Fact Sheet for Patients:  StrictlyIdeas.no Fact Sheet for Healthcare Providers: BankingDealers.co.za This test is not yet approved or cleared by the [place] FDA and has been authorized for detection and/or diagnosis of SARS-CoV-2 by FDA under an Emergency Use Authorization (EUA).  This EUA will remain in effect (meaning this test can be used) for the duration of the COVID-19 declaration under Section 564(b)(1) of the Act, 21 U.S.C. section 360bbb-3(b)(1), unless the authorization is terminated or revoked sooner. Performed at [facility], [place], [place] [address]   Troponin I (High Sensitivity)     Status: None   Collection Time: 07/02/19  5:14 PM  Result Value Ref Range   Troponin I (High Sensitivity) 8 <18 ng/L    Comment: (NOTE) Elevated high sensitivity troponin I (hsTnI) values and significant  changes across serial measurements may suggest ACS but many other  chronic and acute conditions are known to elevate hsTnI results.  Refer to the "Links" section for chest pain algorithms and additional  guidance. Performed at [facility], [address] [place], [place] [address]   Phosphorus     Status: Abnormal    Collection Time: 07/02/19  5:14 PM  Result Value Ref Range   Phosphorus 2.3 (L) 2.5 - 4.6 mg/dL    Comment: Performed at [facility], [address] [place], [place] [address]  Basic metabolic panel     Status: None   Collection Time: 07/03/19  5:00 AM  Result Value Ref Range   Sodium 138 135 -  145 mmol/L   Potassium 3.6 3.5 - 5.1 mmol/L   Chloride 105 98 - 111 mmol/L   CO2 27 22 - 32 mmol/L   Glucose, Bld 97 70 - 99 mg/dL   BUN 16 8 - 23 mg/dL   Creatinine, Ser 0.92 0.44 - 1.00 mg/dL   Calcium 8.9 8.9 - 10.3 mg/dL   GFR calc non Af Amer >60 >60 mL/min   GFR calc Af Amer >60 >60 mL/min   Anion gap 6 5 - 15    Comment: Performed at [facility], [address] [place], [place] [address]  CBC     Status: Abnormal   Collection Time: 07/03/19  5:00 AM  Result Value Ref Range   WBC 9.7 4.0 - 10.5 K/uL   RBC 3.91 3.87 - 5.11 MIL/uL   Hemoglobin 10.8 (L) 12.0 - 15.0 g/dL   HCT 33.9 (L) 36.0 - 46.0 %   MCV 86.7 80.0 - 100.0 fL   MCH 27.6 26.0 - 34.0 pg   MCHC 31.9 30.0 - 36.0 g/dL   RDW 16.9 (H) 11.5 - 15.5 %   Platelets 230 150 - 400 K/uL   nRBC 0.0 0.0 - 0.2 %    Comment: Performed at [facility], [address] [place], [place] [address]  CBC     Status: Abnormal   Collection Time: 07/05/19 12:51 PM  Result Value Ref Range   WBC 8.1 4.0 - 10.5 K/uL   RBC 4.03 3.87 - 5.11 MIL/uL   Hemoglobin 11.1 (L) 12.0 - 15.0 g/dL   HCT 35.5 (L) 36.0 - 46.0 %   MCV 88.1 80.0 - 100.0 fL   MCH 27.5 26.0 - 34.0 pg   MCHC 31.3 30.0 - 36.0 g/dL   RDW 17.3 (H) 11.5 - 15.5 %   Platelets 217 150 - 400 K/uL   nRBC 0.0 0.0 - 0.2 %    Comment: Performed at [facility], [address] [place], [place] [address]  Basic metabolic panel     Status: Abnormal   Collection Time: 07/05/19 12:51 PM  Result Value Ref Range   Sodium 137 135 - 145 mmol/L   Potassium 3.4 (L) 3.5 - 5.1 mmol/L   Chloride 105 98 - 111 mmol/L   CO2 23 22 - 32 mmol/L   Glucose, Bld  172 (H) 70 - 99 mg/dL   BUN 12 8 - 23 mg/dL   Creatinine, Ser 0.88 0.44 - 1.00 mg/dL   Calcium 9.1 8.9 - 10.3 mg/dL   GFR calc non Af Amer >60 >60 mL/min   GFR calc Af Amer >60 >60 mL/min   Anion gap 9 5 - 15    Comment: Performed at [facility], [address] [place], [place] [address]  SARS Coronavirus 2 [facility] order, Performed in [facility] hospital lab) Nasopharyngeal Nasopharyngeal Swab     Status: None   Collection Time: 07/06/19  1:15 PM   Specimen: Nasopharyngeal Swab  Result Value Ref Range   SARS Coronavirus 2 NEGATIVE NEGATIVE    Comment: (NOTE) If result is NEGATIVE SARS-CoV-2 target nucleic acids are NOT DETECTED. The SARS-CoV-2 RNA is generally detectable in upper and lower  respiratory specimens during the acute phase of infection. The lowest  concentration of SARS-CoV-2 viral copies this assay can detect is 250  copies / mL. A negative result does not preclude SARS-CoV-2 infection  and should not be used as the sole basis for treatment or other  patient management  decisions.  A negative result may occur with  improper specimen collection / handling, submission of specimen other  than nasopharyngeal swab, presence of viral mutation(s) within the  areas targeted by this assay, and inadequate number of viral copies  (<250 copies / mL). A negative result must be combined with clinical  observations, patient history, and epidemiological information. If result is POSITIVE SARS-CoV-2 target nucleic acids are DETECTED. The SARS-CoV-2 RNA is generally detectable in upper and lower  respiratory specimens dur ing the acute phase of infection.  Positive  results are indicative of active infection with SARS-CoV-2.  Clinical  correlation with patient history and other diagnostic information is  necessary to determine patient infection status.  Positive results do  not rule out bacterial infection or co-infection with other viruses. If result is PRESUMPTIVE  POSTIVE SARS-CoV-2 nucleic acids MAY BE PRESENT.   A presumptive positive result was obtained on the submitted specimen  and confirmed on repeat testing.  While 2019 novel coronavirus  (SARS-CoV-2) nucleic acids may be present in the submitted sample  additional confirmatory testing may be necessary for epidemiological  and / or clinical management purposes  to differentiate between  SARS-CoV-2 and other Sarbecovirus currently known to infect humans.  If clinically indicated additional testing with an alternate test  methodology [phone]) is advised. The SARS-CoV-2 RNA is generally  detectable in upper and lower respiratory sp ecimens during the acute  phase of infection. The expected result is Negative. Fact Sheet for Patients:  StrictlyIdeas.no Fact Sheet for Healthcare Providers: BankingDealers.co.za This test is not yet approved or cleared by the [place] FDA and has been authorized for detection and/or diagnosis of SARS-CoV-2 by FDA under an Emergency Use Authorization (EUA).  This EUA will remain in effect (meaning this test can be used) for the duration of the COVID-19 declaration under Section 564(b)(1) of the Act, 21 U.S.C. section 360bbb-3(b)(1), unless the authorization is terminated or revoked sooner. Performed at [facility], [address] [place], [place] [address]   Comprehensive metabolic panel     Status: Abnormal   Collection Time: 07/15/19  8:21 PM  Result Value Ref Range   Sodium 135 135 - 145 mmol/L   Potassium 3.9 3.5 - 5.1 mmol/L   Chloride 99 98 - 111 mmol/L   CO2 26 22 - 32 mmol/L   Glucose, Bld 121 (H) 70 - 99 mg/dL   BUN 11 8 - 23 mg/dL   Creatinine, Ser 0.77 0.44 - 1.00 mg/dL   Calcium 9.3 8.9 - 10.3 mg/dL   Total Protein 6.6 6.5 - 8.1 g/dL   Albumin 3.0 (L) 3.5 - 5.0 g/dL   AST 33 15 - 41 U/L   ALT 13 0 - 44 U/L   Alkaline Phosphatase 53 38 - 126 U/L   Total Bilirubin 0.7 0.3 - 1.2  mg/dL   GFR calc non Af Amer >60 >60 mL/min   GFR calc Af Amer >60 >60 mL/min   Anion gap 10 5 - 15    Comment: Performed at [facility], [place], [place] [address]  Troponin I (High Sensitivity)     Status: None   Collection Time: 07/15/19  8:21 PM  Result Value Ref Range   Troponin I (High Sensitivity) 10 <18 ng/L    Comment: (NOTE) Elevated high sensitivity troponin I (hsTnI) values and significant  changes across serial measurements may suggest ACS but many other  chronic and acute conditions are known to elevate hsTnI  results.  Refer to the "Links" section for chest pain algorithms and additional  guidance. Performed at [facility], [address] [place], [place] [address]   CBC with Differential     Status: Abnormal   Collection Time: 07/15/19  8:21 PM  Result Value Ref Range   WBC 10.2 4.0 - 10.5 K/uL   RBC 3.90 3.87 - 5.11 MIL/uL   Hemoglobin 10.9 (L) 12.0 - 15.0 g/dL   HCT 34.4 (L) 36.0 - 46.0 %   MCV 88.2 80.0 - 100.0 fL   MCH 27.9 26.0 - 34.0 pg   MCHC 31.7 30.0 - 36.0 g/dL   RDW 17.2 (H) 11.5 - 15.5 %   Platelets 261 150 - 400 K/uL   nRBC 0.0 0.0 - 0.2 %   Neutrophils Relative % 77 %   Neutro Abs 7.9 (H) 1.7 - 7.7 K/uL   Lymphocytes Relative 10 %   Lymphs Abs 1.0 0.7 - 4.0 K/uL   Monocytes Relative 11 %   Monocytes Absolute 1.1 (H) 0.1 - 1.0 K/uL   Eosinophils Relative 1 %   Eosinophils Absolute 0.1 0.0 - 0.5 K/uL   Basophils Relative 0 %   Basophils Absolute 0.0 0.0 - 0.1 K/uL   Immature Granulocytes 1 %   Abs Immature Granulocytes 0.08 (H) 0.00 - 0.07 K/uL    Comment: Performed at [facility], [address] [place], [place] [address]  Protime-INR     Status: Abnormal   Collection Time: 07/15/19  8:21 PM  Result Value Ref Range   Prothrombin Time 15.8 (H) 11.4 - 15.2 seconds   INR 1.3 (H) 0.8 - 1.2    Comment: (NOTE) INR goal varies based on device and disease states. Performed at [facility], [address] [place], [place] [address]   APTT     Status: Abnormal   Collection Time: 07/15/19  8:21 PM  Result Value Ref Range   aPTT 49 (H) 24 - 36 seconds    Comment:        IF BASELINE aPTT IS ELEVATED, SUGGEST PATIENT RISK ASSESSMENT BE USED TO DETERMINE APPROPRIATE ANTICOAGULANT THERAPY. Performed at [facility], [place], [place] [address]   SARS CORONAVIRUS 2 Nasal Swab Aptima Multi Swab     Status: None   Collection Time: 07/16/19  3:17 AM   Specimen: Aptima Multi Swab; Nasal Swab  Result Value Ref Range   SARS Coronavirus 2 NEGATIVE NEGATIVE    Comment: (NOTE) SARS-CoV-2 target nucleic acids are NOT DETECTED. The SARS-CoV-2 RNA is generally detectable in upper and lower respiratory specimens during the acute phase of infection. Negative results do not preclude SARS-CoV-2 infection, do not rule out co-infections with other pathogens, and should not be used as the sole basis for treatment or other patient management decisions. Negative results must be combined with clinical observations, patient history, and epidemiological information. The expected result is Negative. Fact Sheet for Patients: SugarRoll.be Fact Sheet for Healthcare Providers: https://www.woods-mathews.com/ This test is not yet approved or cleared by the [place] FDA and  has been authorized for detection and/or diagnosis of SARS-CoV-2 by FDA under an Emergency Use Authorization (EUA). This EUA will remain  in effect (meaning this test can be used) for the duration of the COVID-19 declaration under Section 56 4(b)(1) of the Act, 21 U.S.C. section 360bbb-3(b)(1), unless the authorization is terminated or revoked sooner. Performed at [facility], [place] [address] [place], [place] [address]  MRSA PCR Screening     Status: None   Collection Time: 07/16/19  5:09 AM   Specimen: Nasal Mucosa; Nasopharyngeal  Result  Value Ref Range   MRSA by PCR NEGATIVE NEGATIVE    Comment:        The GeneXpert MRSA Assay (FDA approved for NASAL specimens only), is one component of a comprehensive MRSA colonization surveillance program. It is not intended to diagnose MRSA infection nor to guide or monitor treatment for MRSA infections. Performed at [facility], [place], [place] [address]   Troponin I (High Sensitivity)     Status: None   Collection Time: 07/16/19  5:25 AM  Result Value Ref Range   Troponin I (High Sensitivity) 12 <18 ng/L    Comment: (NOTE) Elevated high sensitivity troponin I (hsTnI) values and significant  changes across serial measurements may suggest ACS but many other  chronic and acute conditions are known to elevate hsTnI results.  Refer to the "Links" section for chest pain algorithms and additional  guidance. Performed at [facility], [address] [place], [place] [address]   TSH     Status: None   Collection Time: 07/16/19  5:26 AM  Result Value Ref Range   TSH 2.729 0.350 - 4.500 uIU/mL    Comment: Performed by a 3rd Generation assay with a functional sensitivity of <=0.01 uIU/mL. Performed at [facility], [address] [place], [place] [address]   Hemoglobin A1c     Status: Abnormal   Collection Time: 07/16/19  5:26 AM  Result Value Ref Range   Hgb A1c MFr Bld 5.8 (H) 4.8 - 5.6 %    Comment: (NOTE) Pre diabetes:          5.7%-6.4% Diabetes:              >6.4% Glycemic control for   <7.0% adults with diabetes    Mean Plasma Glucose 119.76 mg/dL    Comment: Performed at [facility] [address] [place], [place] [address]  Heparin level (unfractionated)     Status: Abnormal   Collection Time: 07/16/19  1:07 PM  Result Value Ref Range   Heparin Unfractionated <0.10 (L) 0.30 - 0.70 IU/mL    Comment: (NOTE) If heparin results are below expected values, and patient dosage has  been confirmed, suggest follow  up testing of antithrombin III levels. Performed at [facility], [address] [place], [place] [address]   ECHOCARDIOGRAM COMPLETE     Status: None   Collection Time: 07/16/19  6:07 PM  Result Value Ref Range   Weight 2,721.6 oz   Height 60 in   BP 155/68 mmHg  Heparin level (unfractionated)     Status: None   Collection Time: 07/16/19 10:56 PM  Result Value Ref Range   Heparin Unfractionated 0.47 0.30 - 0.70 IU/mL    Comment: (NOTE) If heparin results are below expected values, and patient dosage has  been confirmed, suggest follow up testing of antithrombin III levels. Performed at [facility], [place], [place] [address]   Heparin level (unfractionated)     Status: None   Collection Time: 07/17/19  7:07 AM  Result Value Ref Range   Heparin Unfractionated 0.62 0.30 - 0.70 IU/mL    Comment: (NOTE) If heparin results are below expected values, and patient dosage has  been confirmed, suggest follow up testing of antithrombin III levels. Performed at [facility], [address] [place], [place] [address]   CBC     Status: Abnormal   Collection Time: 07/17/19  7:07 AM  Result Value Ref Range   WBC 10.0 4.0 - 10.5 K/uL   RBC 3.58 (L) 3.87 - 5.11 MIL/uL   Hemoglobin 9.9 (L) 12.0 - 15.0 g/dL   HCT 31.5 (L) 36.0 - 46.0 %   MCV 88.0 80.0 - 100.0 fL   MCH 27.7 26.0 - 34.0 pg   MCHC 31.4 30.0 - 36.0 g/dL   RDW 17.2 (H) 11.5 - 15.5 %   Platelets 291 150 - 400 K/uL   nRBC 0.0 0.0 - 0.2 %    Comment: Performed at [facility], [address] [place], [place] [address]  Heparin level (unfractionated)     Status: None   Collection Time: 07/18/19  5:53 AM  Result Value Ref Range   Heparin Unfractionated 0.43 0.30 - 0.70 IU/mL    Comment: (NOTE) If heparin results are below expected values, and patient dosage has  been confirmed, suggest follow up testing of antithrombin III levels. Performed at [facility],  [address] [place], [place] [address]   CBC     Status: Abnormal   Collection Time: 07/18/19  5:53 AM  Result Value Ref Range   WBC 10.8 (H) 4.0 - 10.5 K/uL   RBC 3.55 (L) 3.87 - 5.11 MIL/uL   Hemoglobin 10.0 (L) 12.0 - 15.0 g/dL   HCT 31.7 (L) 36.0 - 46.0 %   MCV 89.3 80.0 - 100.0 fL   MCH 28.2 26.0 - 34.0 pg   MCHC 31.5 30.0 - 36.0 g/dL   RDW 17.2 (H) 11.5 - 15.5 %   Platelets 329 150 - 400 K/uL   nRBC 0.0 0.0 - 0.2 %    Comment: Performed at [facility], [address] [place], [place] [address]  CBC     Status: Abnormal   Collection Time: 07/19/19  5:16 AM  Result Value Ref Range   WBC 9.8 4.0 - 10.5 K/uL   RBC 3.41 (L) 3.87 - 5.11 MIL/uL   Hemoglobin 9.4 (L) 12.0 - 15.0 g/dL   HCT 29.9 (L) 36.0 - 46.0 %   MCV 87.7 80.0 - 100.0 fL   MCH 27.6 26.0 - 34.0 pg   MCHC 31.4 30.0 - 36.0 g/dL   RDW 17.3 (H) 11.5 - 15.5 %   Platelets 352 150 - 400 K/uL   nRBC 0.0 0.0 - 0.2 %    Comment: Performed at [facility], [address] [place], [place] [address]  Glucose, capillary     Status: None   Collection Time: 07/19/19  7:25 AM  Result Value Ref Range   Glucose-Capillary 96 70 - 99 mg/dL   Comment 1 Notify RN    Comment 2 Document in Chart   Novel Coronavirus, NAA (hospital order; send-out to ref lab)     Status: None   Collection Time: 07/19/19  9:45 AM   Specimen: Nasopharyngeal Swab; Respiratory  Result Value Ref Range   SARS-CoV-2, NAA NOT DETECTED NOT DETECTED    Comment: (NOTE) This test was developed and its performance characteristics determined by [employer]. This test has not been FDA cleared or approved. This test has been authorized by FDA under an Emergency Use Authorization (EUA). This test is only authorized for the duration of time the declaration that circumstances exist justifying the authorization of the emergency use of in vitro diagnostic tests for detection of  SARS-CoV-2 virus and/or diagnosis of COVID-19  infection under section 564(b)(1) of the Act, 21 U.S.C. 712WPY-0(D)(9), unless the authorization is terminated or revoked sooner. When diagnostic testing is negative, the possibility of a false negative result should be considered in the context of a patient's recent exposures and the presence of clinical signs and symptoms consistent with COVID-19. An individual without symptoms of COVID-19 and who is not shedding SARS-CoV-2 virus would expect to have a negative (not detected) result in this assay. Performed  At: [facility] [address] [place], [place] [id number] Rush Farmer MD [id number]    Coronavirus Source NASOPHARYNGEAL     Comment: Performed at [facility], [address] [place], [place] [address]  Ferritin     Status: None   Collection Time: 07/27/19  3:48 PM  Result Value Ref Range   Ferritin 36 11 - 307 ng/mL    Comment: Performed at [facility], [address] [place], [place] [address]  CBC with Differential/Platelet     Status: Abnormal   Collection Time: 07/27/19  3:48 PM  Result Value Ref Range   WBC 8.5 4.0 - 10.5 K/uL   RBC 4.00 3.87 - 5.11 MIL/uL   Hemoglobin 11.1 (L) 12.0 - 15.0 g/dL   HCT 36.1 36.0 - 46.0 %   MCV 90.3 80.0 - 100.0 fL   MCH 27.8 26.0 - 34.0 pg   MCHC 30.7 30.0 - 36.0 g/dL   RDW 17.2 (H) 11.5 - 15.5 %   Platelets 512 (H) 150 - 400 K/uL   nRBC 0.0 0.0 - 0.2 %   Neutrophils Relative % 72 %   Neutro Abs 6.2 1.7 - 7.7 K/uL   Lymphocytes Relative 16 %   Lymphs Abs 1.4 0.7 - 4.0 K/uL   Monocytes Relative 8 %   Monocytes Absolute 0.7 0.1 - 1.0 K/uL   Eosinophils Relative 2 %   Eosinophils Absolute 0.2 0.0 - 0.5 K/uL   Basophils Relative 1 %   Basophils Absolute 0.0 0.0 - 0.1 K/uL   Immature Granulocytes 1 %   Abs Immature Granulocytes 0.05 0.00 - 0.07 K/uL    Comment: Performed at [facility], [address] [place], [place] [address]  Iron and TIBC     Status: Abnormal   Collection Time:  07/27/19  3:48 PM  Result Value Ref Range   Iron 25 (L) 28 - 170 ug/dL   TIBC 341 250 - 450 ug/dL   Saturation Ratios 7 (L) 10.4 - 31.8 %   UIBC 316 ug/dL    Comment: Performed at [facility], [address] [place], [place] [address]  Vitamin B12     Status: None   Collection Time: 07/27/19  3:55 PM  Result Value Ref Range   Vitamin B-12 593 180 - 914 pg/mL    Comment: (NOTE) This assay is not validated for testing neonatal or myeloproliferative syndrome specimens for Vitamin B12 levels. Performed at [facility], [place] [address], [place], [place] [address]   Folate     Status: None   Collection Time: 07/27/19  3:56 PM  Result Value Ref Range   Folate 15.7 >5.9 ng/mL    Comment: Performed at [facility], [address], [place], [place] [address]    Radiology Ct Head Wo Contrast  Result Date: 07/02/2019 CLINICAL DATA:  Two weeks of nausea with some nystagmus.  Dizziness. EXAM: CT HEAD WITHOUT CONTRAST TECHNIQUE: Contiguous axial images were obtained from the  base of the skull through the vertex without intravenous contrast. COMPARISON:  CT head dated June 30, 2018. FINDINGS: Brain: No evidence of acute infarction, hemorrhage, hydrocephalus, extra-axial collection or mass lesion/mass effect. Vascular: No hyperdense vessel or unexpected calcification. Skull: Normal. Negative for fracture or focal lesion. Sinuses/Orbits: No acute finding. Other: None. IMPRESSION: No acute intracranial abnormality. Electronically Signed   By: Constance Holster M.D.   On: 07/02/2019 17:02   Ct Angio Chest Pe W And/or Wo Contrast  Result Date: 07/16/2019 CLINICAL DATA:  Shortness of breath and pleuritic chest pain. EXAM: CT ANGIOGRAPHY CHEST WITH CONTRAST TECHNIQUE: Multidetector CT imaging of the chest was performed using the standard protocol during bolus administration of intravenous contrast. Multiplanar CT image reconstructions and MIPs were obtained to evaluate the  vascular anatomy. CONTRAST:  13mL OMNIPAQUE IOHEXOL 350 MG/ML SOLN COMPARISON:  Radiograph yesterday. FINDINGS: Cardiovascular: Examination is positive for acute pulmonary embolism with filling defects in the bilateral main pulmonary arteries extending into lobar and segmental branches. Thromboembolic burden is greater on the right and in the lower lobe. Mild straightening of the intraventricular septum with RV to LV ratio of 1. Thoracic aortic atherosclerosis without dissection. Conventional branching pattern from the aortic arch. Coronary artery calcifications. Mediastinum/Nodes: No enlarged mediastinal or hilar lymph nodes. Thyroid gland not well visualized. Esophagus is decompressed. Lungs/Pleura: Peripheral ground-glass opacity in the right lower lobe consistent with pulmonary infarct. Small right pleural effusion with adjacent compressive atelectasis. Scattered linear atelectasis in the left lung. No pulmonary edema. Upper Abdomen: No acute findings. Musculoskeletal: There are no acute or suspicious osseous abnormalities. Multilevel degenerative change throughout the spine. Degenerative change of both shoulders. Review of the MIP images confirms the above findings. IMPRESSION: 1. Positive for acute pulmonary embolism with CT evidence of right heart strain. Filling defects in bilateral main pulmonary arteries extending into lobar and multiple segmental branches, greater on the right. Thromboembolic burden is moderate to large. 2. Right lower lobe pulmonary infarct. Small right pleural effusion with adjacent compressive atelectasis. 3. Aortic Atherosclerosis (ICD10-I70.0). Coronary artery calcifications. Critical Value/emergent results were called by telephone at the time of interpretation on 07/16/2019 at 1:08 am to Dr. Harvest Dark, who verbally acknowledged these results. Electronically Signed   By: Keith Rake M.D.   On: 07/16/2019 01:09   Mr Brain Wo Contrast  Result Date: 07/02/2019 CLINICAL  DATA:  Initial evaluation for acute nausea. EXAM: MRI HEAD WITHOUT CONTRAST TECHNIQUE: Multiplanar, multiecho pulse sequences of the brain and surrounding structures were obtained without intravenous contrast. COMPARISON:  Prior head CT from earlier the same day. FINDINGS: Brain: Cerebral volume within normal limits. No significant cerebral white matter disease for age. Small focus of encephalomalacia and gliosis involving the parasagittal left parieto-occipital region compatible with chronic ischemic infarct. Additional tiny remote lacunar infarcts noted involving the bilateral thalami. No abnormal foci of restricted diffusion to suggest acute or subacute ischemia. Gray-white matter differentiation otherwise maintained. No other areas of remote cortical infarction. No acute intracranial hemorrhage. Single chronic microhemorrhage noted at the anterior left frontal region, of doubtful significance in isolation. No mass lesion, midline shift or mass effect. No hydrocephalus. No extra-axial fluid collection. Pituitary gland suprasellar region normal. Midline structures intact. Vascular: Major intracranial vascular flow voids are maintained. Skull and upper cervical spine: Craniocervical junction within normal limits. Upper cervical spine normal. Bone marrow signal intensity within normal limits. No scalp soft tissue abnormality. Sinuses/Orbits: Patient status post bilateral ocular lens replacement. Globes and orbital soft tissues demonstrate no acute finding.  Paranasal sinuses are clear. No mastoid effusion. Inner ear structures normal. Other: None. IMPRESSION: 1. No acute intracranial infarct or other abnormality identified. 2. Small remote left parieto-occipital cortical infarct, with additional tiny remote bilateral thalamic lacunar infarcts. 3. Otherwise normal brain MRI for age. Electronically Signed   By: Jeannine Boga M.D.   On: 07/02/2019 21:03   Ct Abdomen Pelvis W Contrast  Result Date:  07/02/2019 CLINICAL DATA:  Prior diverticulitis with continued nausea. EXAM: CT ABDOMEN AND PELVIS WITH CONTRAST TECHNIQUE: Multidetector CT imaging of the abdomen and pelvis was performed using the standard protocol following bolus administration of intravenous contrast. CONTRAST:  164mL OMNIPAQUE IOHEXOL 300 MG/ML  SOLN COMPARISON:  November 08, 2015 FINDINGS: Lower chest: The lung bases are clear. The heart size is normal. Hepatobiliary: The liver is normal. Normal gallbladder.There is no biliary ductal dilation. Pancreas: Normal contours without ductal dilatation. No peripancreatic fluid collection. Spleen: No splenic laceration or hematoma. Adrenals/Urinary Tract: --Adrenal glands: No adrenal hemorrhage. --Right kidney/ureter: No hydronephrosis or perinephric hematoma. --Left kidney/ureter: No hydronephrosis or perinephric hematoma. --Urinary bladder: There is bladder wall thickening despite the degree of underdistention. Stomach/Bowel: --Stomach/Duodenum: No hiatal hernia or other gastric abnormality. Normal duodenal course and caliber. --Small bowel: No dilatation or inflammation. --Colon: There is sigmoid diverticulosis with evidence of early sigmoid diverticulitis. --Appendix: Normal. Vascular/Lymphatic: Atherosclerotic calcification is present within the non-aneurysmal abdominal aorta, without hemodynamically significant stenosis. --No retroperitoneal lymphadenopathy. --No mesenteric lymphadenopathy. --No pelvic or inguinal lymphadenopathy. Reproductive: Again noted is an abnormal appearance of the uterus favored to represent fibroids. Other: No ascites or free air. The abdominal wall is normal. Musculoskeletal. Multilevel degenerative disc disease and facet arthrosis. No bony spinal canal stenosis. IMPRESSION: 1. Sigmoid diverticulosis with evidence for early uncomplicated sigmoid diverticulitis. 2. Mild bladder wall thickening. Correlation with urinalysis is recommended to help exclude an underlying  cystitis. Electronically Signed   By: Constance Holster M.D.   On: 07/02/2019 16:57   US Venous Img Lower Bilateral  Result Date: 07/16/2019 CLINICAL DATA:  Pulmonary emboli, leg pain EXAM: BILATERAL LOWER EXTREMITY VENOUS DOPPLER ULTRASOUND TECHNIQUE: Gray-scale sonography with graded compression, as well as color Doppler and duplex ultrasound were performed to evaluate the lower extremity deep venous systems from the level of the common femoral vein and including the common femoral, femoral, profunda femoral, popliteal and calf veins including the posterior tibial, peroneal and gastrocnemius veins when visible. The superficial great saphenous vein was also interrogated. Spectral Doppler was utilized to evaluate flow at rest and with distal augmentation maneuvers in the common femoral, femoral and popliteal veins. COMPARISON:  None. FINDINGS: RIGHT LOWER EXTREMITY Common Femoral Vein: No evidence of thrombus. Normal compressibility, respiratory phasicity and response to augmentation. Saphenofemoral Junction: No evidence of thrombus. Normal compressibility and flow on color Doppler imaging. Profunda Femoral Vein: No evidence of thrombus. Normal compressibility and flow on color Doppler imaging. Femoral Vein: No evidence of thrombus. Normal compressibility, respiratory phasicity and response to augmentation. Popliteal Vein: No evidence of thrombus. Normal compressibility, respiratory phasicity and response to augmentation. Calf Veins: No evidence of thrombus. Normal compressibility and flow on color Doppler imaging. Superficial Great Saphenous Vein: No evidence of thrombus. Normal compressibility. Venous Reflux:  Not assessed Other Findings:  None. LEFT LOWER EXTREMITY Common Femoral Vein: Positive for hypoechoic thrombus. Vessel is noncompressible. Thrombus partially occlusive. Saphenofemoral Junction: Femoral thrombus extends into the saphenofemoral junction, partially occlusive. Profunda Femoral Vein: No  evidence of thrombus. Normal compressibility and flow on color Doppler imaging. Femoral Vein: Hypoechoic  thrombus. Vessel is noncompressible. Thrombus appears occlusive throughout the femoral vein. Popliteal Vein: Hypoechoic thrombus. Vessel is also noncompressible. Thrombus appears occlusive. Calf Veins: Thrombus does appear to extend into the tibial and peroneal calf veins inferiorly. Superficial Great Saphenous Vein: No evidence of thrombus. Normal compressibility. Venous Reflux:  Not assessed Other Findings:  None. IMPRESSION: Positive exam for left femoropopliteal DVT extending into the calf veins. Negative for right lower extremity DVT. These results will be called to the ordering clinician or representative by the Radiologist Assistant, and communication documented in the PACS or zVision Dashboard. Electronically Signed   By: Jerilynn Mages.  Shick M.D.   On: 07/16/2019 12:50   Dg Chest Portable 1 View  Result Date: 07/15/2019 CLINICAL DATA:  Shortness of breath. EXAM: PORTABLE CHEST 1 VIEW COMPARISON:  Radiograph 09/02/2018 FINDINGS: Chronic elevation of right hemidiaphragm. Unchanged heart size and mediastinal contours. Aortic atherosclerosis. Streaky bibasilar opacities without confluent airspace disease. No pleural effusion or pneumothorax. Degenerative change of the left shoulder. IMPRESSION: Streaky bibasilar opacities. Aortic Atherosclerosis (ICD10-I70.0). Electronically Signed   By: Keith Rake M.D.   On: 07/15/2019 20:43    Assessment/Plan  HTN (hypertension) blood pressure control important in reducing the progression of atherosclerotic disease. On appropriate oral medications.   Bilateral pulmonary embolism (HCC) Tolerating anticoagulation with minimal symptoms at this point even though it was a fairly large PE.  Continue anticoagulation  Left leg DVT (HCC) Her DVT study today shows continued fairly extensive left lower extremity DVT with little improvement from her study 2 weeks ago.  She  is tolerating anticoagulation and her symptoms are fairly mild.  At this point, no intervention would be of benefit as we have likely past the window of potential benefit and she did not want to have this done which is reasonable.  She will continue anticoagulation for a minimum of 6 months.  We will see her back in 3 months.    Leotis Pain, MD  07/29/2019 9:58 AM    This note was created with Dragon medical transcription system.  Any errors from dictation are purely unintentional

## 2019-07-29 NOTE — Assessment & Plan Note (Signed)
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  She is tolerating anticoagulation and her symptoms are fairly mild.  At this point, no intervention would be of benefit as we have likely past the window of potential benefit and she did not want to have this done which is reasonable.  She will continue anticoagulation for a minimum of 6 months.  We will see her back in 3 months.

## 2019-07-29 NOTE — Progress Notes (Signed)
Pt informed

## 2019-07-29 NOTE — Assessment & Plan Note (Signed)
blood pressure control important in reducing the progression of atherosclerotic disease. On appropriate oral medications.  

## 2019-08-08 ENCOUNTER — Encounter: Payer: Self-pay | Admitting: Gastroenterology

## 2019-08-08 ENCOUNTER — Telehealth: Payer: Self-pay | Admitting: Gastroenterology

## 2019-08-08 NOTE — Telephone Encounter (Signed)
Left vm to offer apt. Letter sent

## 2019-08-08 NOTE — Telephone Encounter (Signed)
-----   Message from Virgel Manifold, MD sent at 07/13/2019 12:12 PM EDT -----  Please set up clinic appointment with me in 6 weeks

## 2019-08-18 ENCOUNTER — Encounter (INDEPENDENT_AMBULATORY_CARE_PROVIDER_SITE_OTHER): Payer: Self-pay

## 2019-09-02 ENCOUNTER — Emergency Department
Admission: EM | Admit: 2019-09-02 | Discharge: 2019-09-02 | Disposition: A | Discharge status: Home / Self Care | Payer: Medicare PPO | Attending: Student in an Organized Health Care Education/Training Program | Admitting: Student in an Organized Health Care Education/Training Program

## 2019-09-02 ENCOUNTER — Other Ambulatory Visit: Payer: Self-pay

## 2019-09-02 ENCOUNTER — Emergency Department: Payer: Medicare PPO

## 2019-09-02 ENCOUNTER — Encounter: Payer: Self-pay | Admitting: Emergency Medicine

## 2019-09-02 DIAGNOSIS — Z7982 Long term (current) use of aspirin: Secondary | ICD-10-CM | POA: Diagnosis not present

## 2019-09-02 DIAGNOSIS — Z7901 Long term (current) use of anticoagulants: Secondary | ICD-10-CM | POA: Diagnosis not present

## 2019-09-02 DIAGNOSIS — I129 Hypertensive chronic kidney disease with stage 1 through stage 4 chronic kidney disease, or unspecified chronic kidney disease: Secondary | ICD-10-CM | POA: Insufficient documentation

## 2019-09-02 DIAGNOSIS — Z96652 Presence of left artificial knee joint: Secondary | ICD-10-CM | POA: Diagnosis not present

## 2019-09-02 DIAGNOSIS — F0390 Unspecified dementia without behavioral disturbance: Secondary | ICD-10-CM | POA: Diagnosis not present

## 2019-09-02 DIAGNOSIS — R0602 Shortness of breath: Secondary | ICD-10-CM | POA: Diagnosis present

## 2019-09-02 DIAGNOSIS — N189 Chronic kidney disease, unspecified: Secondary | ICD-10-CM | POA: Diagnosis not present

## 2019-09-02 DIAGNOSIS — R5383 Other fatigue: Secondary | ICD-10-CM | POA: Insufficient documentation

## 2019-09-02 DIAGNOSIS — R531 Weakness: Secondary | ICD-10-CM | POA: Insufficient documentation

## 2019-09-02 DIAGNOSIS — E039 Hypothyroidism, unspecified: Secondary | ICD-10-CM | POA: Insufficient documentation

## 2019-09-02 DIAGNOSIS — Z79899 Other long term (current) drug therapy: Secondary | ICD-10-CM | POA: Diagnosis not present

## 2019-09-02 DIAGNOSIS — Z20828 Contact with and (suspected) exposure to other viral communicable diseases: Secondary | ICD-10-CM | POA: Diagnosis not present

## 2019-09-02 LAB — CBC WITH DIFFERENTIAL/PLATELET
Abs Immature Granulocytes: 0.03 10*3/uL (ref 0.00–0.07)
Basophils Absolute: 0.1 10*3/uL (ref 0.0–0.1)
Basophils Relative: 1 %
Eosinophils Absolute: 0.1 10*3/uL (ref 0.0–0.5)
Eosinophils Relative: 1 %
HCT: 37.3 % (ref 36.0–46.0)
Hemoglobin: 12 g/dL (ref 12.0–15.0)
Immature Granulocytes: 0 %
Lymphocytes Relative: 12 %
Lymphs Abs: 1 10*3/uL (ref 0.7–4.0)
MCH: 27.4 pg (ref 26.0–34.0)
MCHC: 32.2 g/dL (ref 30.0–36.0)
MCV: 85.2 fL (ref 80.0–100.0)
Monocytes Absolute: 0.5 10*3/uL (ref 0.1–1.0)
Monocytes Relative: 6 %
Neutro Abs: 6.4 10*3/uL (ref 1.7–7.7)
Neutrophils Relative %: 80 %
Platelets: 267 10*3/uL (ref 150–400)
RBC: 4.38 MIL/uL (ref 3.87–5.11)
RDW: 16.8 % — ABNORMAL HIGH (ref 11.5–15.5)
WBC: 8 10*3/uL (ref 4.0–10.5)
nRBC: 0 % (ref 0.0–0.2)

## 2019-09-02 LAB — COMPREHENSIVE METABOLIC PANEL
ALT: 12 U/L (ref 0–44)
AST: 25 U/L (ref 15–41)
Albumin: 4.5 g/dL (ref 3.5–5.0)
Alkaline Phosphatase: 48 U/L (ref 38–126)
Anion gap: 14 (ref 5–15)
BUN: 8 mg/dL (ref 8–23)
CO2: 23 mmol/L (ref 22–32)
Calcium: 10.3 mg/dL (ref 8.9–10.3)
Chloride: 103 mmol/L (ref 98–111)
Creatinine, Ser: 0.74 mg/dL (ref 0.44–1.00)
GFR calc Af Amer: >60 mL/min (ref >60)
GFR calc non Af Amer: >60 mL/min (ref >60)
Glucose, Bld: 103 mg/dL — ABNORMAL HIGH (ref 70–99)
Potassium: 3.5 mmol/L (ref 3.5–5.1)
Sodium: 140 mmol/L (ref 135–145)
Total Bilirubin: 0.7 mg/dL (ref 0.3–1.2)
Total Protein: 8.2 g/dL — ABNORMAL HIGH (ref 6.5–8.1)

## 2019-09-02 LAB — SARS CORONAVIRUS 2 BY RT PCR (HOSPITAL ORDER, PERFORMED IN ~~LOC~~ HOSPITAL LAB): SARS Coronavirus 2: NEGATIVE

## 2019-09-02 LAB — TROPONIN I (HIGH SENSITIVITY)
Troponin I (High Sensitivity): 6 ng/L (ref <18)
Troponin I (High Sensitivity): 8 ng/L (ref <18)

## 2019-09-02 MED ORDER — CARVEDILOL 6.25 MG PO TABS
12.5000 mg | ORAL_TABLET | Freq: Two times a day (BID) | ORAL | Status: DC
  Administered 2019-09-02: 12:00:00 12.5 mg via ORAL
  Filled 2019-09-02: qty 2

## 2019-09-02 MED ORDER — AMLODIPINE BESYLATE 5 MG PO TABS
10.0000 mg | ORAL_TABLET | Freq: Once | ORAL | Status: AC
  Administered 2019-09-02: 12:00:00 10 mg via ORAL
  Filled 2019-09-02: qty 2

## 2019-09-02 MED ORDER — IPRATROPIUM-ALBUTEROL 0.5-2.5 (3) MG/3ML IN SOLN
3.0000 mL | Freq: Once | RESPIRATORY_TRACT | Status: AC
  Administered 2019-09-02: 3 mL via RESPIRATORY_TRACT
  Filled 2019-09-02: qty 3

## 2019-09-02 MED ORDER — ISOSORBIDE MONONITRATE ER 60 MG PO TB24
30.0000 mg | ORAL_TABLET | Freq: Every day | ORAL | Status: DC
  Administered 2019-09-02: 30 mg via ORAL
  Filled 2019-09-02 (×2): qty 1

## 2019-09-02 MED ORDER — IPRATROPIUM-ALBUTEROL 0.5-2.5 (3) MG/3ML IN SOLN
3.0000 mL | Freq: Once | RESPIRATORY_TRACT | Status: AC
  Administered 2019-09-02: 14:00:00 3 mL via RESPIRATORY_TRACT
  Filled 2019-09-02: qty 3

## 2019-09-02 NOTE — ED Triage Notes (Signed)
Pt to ER via EMS from home with c/o shortness of breath.  Pt originally called for CP, but stated that had resolved by the time EMS arrived.  Pt states she is short winded and very tired.  EMS placed pt on 2L [place] for comfort, pt now on RA.
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
## 2019-09-13 ENCOUNTER — Ambulatory Visit: Payer: Medicare PPO | Admitting: Gastroenterology

## 2019-09-19 ENCOUNTER — Other Ambulatory Visit: Payer: Self-pay

## 2019-09-20 ENCOUNTER — Ambulatory Visit: Payer: Medicare PPO | Admitting: Gastroenterology

## 2019-10-26 ENCOUNTER — Inpatient Hospital Stay: Payer: Medicare PPO

## 2019-10-26 ENCOUNTER — Inpatient Hospital Stay: Payer: Medicare PPO | Admitting: Oncology

## 2019-10-31 ENCOUNTER — Other Ambulatory Visit (INDEPENDENT_AMBULATORY_CARE_PROVIDER_SITE_OTHER): Payer: Self-pay | Admitting: Vascular Surgery

## 2019-10-31 ENCOUNTER — Other Ambulatory Visit: Payer: Self-pay | Admitting: Oncology

## 2019-10-31 DIAGNOSIS — I82402 Acute embolism and thrombosis of unspecified deep veins of left lower extremity: Secondary | ICD-10-CM

## 2019-11-01 ENCOUNTER — Ambulatory Visit (INDEPENDENT_AMBULATORY_CARE_PROVIDER_SITE_OTHER): Payer: Medicare PPO | Admitting: Vascular Surgery

## 2019-11-01 ENCOUNTER — Encounter (INDEPENDENT_AMBULATORY_CARE_PROVIDER_SITE_OTHER): Payer: Medicare PPO

## 2019-11-15 ENCOUNTER — Ambulatory Visit: Payer: Medicare PPO | Admitting: Gastroenterology

## 2019-11-29 ENCOUNTER — Other Ambulatory Visit: Payer: Self-pay

## 2019-11-29 ENCOUNTER — Inpatient Hospital Stay (HOSPITAL_BASED_OUTPATIENT_CLINIC_OR_DEPARTMENT_OTHER): Payer: Medicare PPO | Admitting: Oncology

## 2019-11-29 ENCOUNTER — Encounter: Payer: Self-pay | Admitting: Oncology

## 2019-11-29 ENCOUNTER — Inpatient Hospital Stay: Payer: Medicare PPO | Attending: Oncology

## 2019-11-29 VITALS — BP 141/76 | HR 67 | Temp 98.6°F | Resp 18 | Wt 174.4 lb

## 2019-11-29 DIAGNOSIS — Z79899 Other long term (current) drug therapy: Secondary | ICD-10-CM | POA: Insufficient documentation

## 2019-11-29 DIAGNOSIS — N189 Chronic kidney disease, unspecified: Secondary | ICD-10-CM | POA: Insufficient documentation

## 2019-11-29 DIAGNOSIS — I82412 Acute embolism and thrombosis of left femoral vein: Secondary | ICD-10-CM

## 2019-11-29 DIAGNOSIS — F419 Anxiety disorder, unspecified: Secondary | ICD-10-CM | POA: Insufficient documentation

## 2019-11-29 DIAGNOSIS — I2699 Other pulmonary embolism without acute cor pulmonale: Secondary | ICD-10-CM

## 2019-11-29 DIAGNOSIS — R5382 Chronic fatigue, unspecified: Secondary | ICD-10-CM | POA: Insufficient documentation

## 2019-11-29 DIAGNOSIS — D649 Anemia, unspecified: Secondary | ICD-10-CM | POA: Diagnosis not present

## 2019-11-29 DIAGNOSIS — G4730 Sleep apnea, unspecified: Secondary | ICD-10-CM | POA: Insufficient documentation

## 2019-11-29 DIAGNOSIS — D473 Essential (hemorrhagic) thrombocythemia: Secondary | ICD-10-CM

## 2019-11-29 DIAGNOSIS — D75839 Thrombocytosis, unspecified: Secondary | ICD-10-CM

## 2019-11-29 DIAGNOSIS — I129 Hypertensive chronic kidney disease with stage 1 through stage 4 chronic kidney disease, or unspecified chronic kidney disease: Secondary | ICD-10-CM | POA: Insufficient documentation

## 2019-11-29 DIAGNOSIS — E039 Hypothyroidism, unspecified: Secondary | ICD-10-CM | POA: Insufficient documentation

## 2019-11-29 DIAGNOSIS — Z7901 Long term (current) use of anticoagulants: Secondary | ICD-10-CM | POA: Insufficient documentation

## 2019-11-29 DIAGNOSIS — M1990 Unspecified osteoarthritis, unspecified site: Secondary | ICD-10-CM | POA: Insufficient documentation

## 2019-11-29 DIAGNOSIS — Z86711 Personal history of pulmonary embolism: Secondary | ICD-10-CM | POA: Diagnosis not present

## 2019-11-29 DIAGNOSIS — K219 Gastro-esophageal reflux disease without esophagitis: Secondary | ICD-10-CM | POA: Insufficient documentation

## 2019-11-29 LAB — CBC WITH DIFFERENTIAL/PLATELET
Abs Immature Granulocytes: 0.02 10*3/uL (ref 0.00–0.07)
Basophils Absolute: 0.1 10*3/uL (ref 0.0–0.1)
Basophils Relative: 1 %
Eosinophils Absolute: 0.2 10*3/uL (ref 0.0–0.5)
Eosinophils Relative: 3 %
HCT: 41.7 % (ref 36.0–46.0)
Hemoglobin: 13.1 g/dL (ref 12.0–15.0)
Immature Granulocytes: 0 %
Lymphocytes Relative: 23 %
Lymphs Abs: 1.6 10*3/uL (ref 0.7–4.0)
MCH: 29.4 pg (ref 26.0–34.0)
MCHC: 31.4 g/dL (ref 30.0–36.0)
MCV: 93.7 fL (ref 80.0–100.0)
Monocytes Absolute: 0.8 10*3/uL (ref 0.1–1.0)
Monocytes Relative: 11 %
Neutro Abs: 4.3 10*3/uL (ref 1.7–7.7)
Neutrophils Relative %: 62 %
Platelets: 243 10*3/uL (ref 150–400)
RBC: 4.45 MIL/uL (ref 3.87–5.11)
RDW: 15.4 % (ref 11.5–15.5)
WBC: 6.9 10*3/uL (ref 4.0–10.5)
nRBC: 0 % (ref 0.0–0.2)

## 2019-11-29 LAB — COMPREHENSIVE METABOLIC PANEL
ALT: 10 U/L (ref 0–44)
AST: 20 U/L (ref 15–41)
Albumin: 4.2 g/dL (ref 3.5–5.0)
Alkaline Phosphatase: 47 U/L (ref 38–126)
Anion gap: 9 (ref 5–15)
BUN: 20 mg/dL (ref 8–23)
CO2: 26 mmol/L (ref 22–32)
Calcium: 10.2 mg/dL (ref 8.9–10.3)
Chloride: 103 mmol/L (ref 98–111)
Creatinine, Ser: 1.02 mg/dL — ABNORMAL HIGH (ref 0.44–1.00)
GFR calc Af Amer: >60 mL/min (ref >60)
GFR calc non Af Amer: 53 mL/min — ABNORMAL LOW (ref >60)
Glucose, Bld: 78 mg/dL (ref 70–99)
Potassium: 4.5 mmol/L (ref 3.5–5.1)
Sodium: 138 mmol/L (ref 135–145)
Total Bilirubin: 0.5 mg/dL (ref 0.3–1.2)
Total Protein: 7.5 g/dL (ref 6.5–8.1)

## 2019-11-29 NOTE — Progress Notes (Signed)
Patient here for follow up. Patient states she feels well. She has not been taking iron pill because they upset her stomach and makes her constipated.

## 2019-11-30 ENCOUNTER — Encounter: Payer: Self-pay | Admitting: Oncology

## 2019-11-30 NOTE — Progress Notes (Signed)
Hematology/Oncology Consult note [facility] Telephone:([phone] [phone]   Patient Care Team: Baxter Hire, MD as PCP - General (Internal Medicine)  REFERRING PROVIDER: Baxter Hire, MD  CHIEF COMPLAINTS/REASON FOR VISIT:   follow up for pulmonary embolism.   HISTORY OF PRESENTING ILLNESS:  Erin Sutton is a 79 y.o. female who was seen in consultation at the request of Baxter Hire, MD for evaluation of pulmonary embolism.  Patient was diagnosed with acute pulmonary embolism on 07/14/2019.  Patient presented to emergency room for dyspnea.  Upon arriving to the emergency room, patient was mildly tachypneic, no hypoxia or tachycardia.  She also had chest pain and elevated d-dimer.  CT PE angiogram 07/16/2019 showed bilateral pulmonary embolism with evidence of right heart strain.  Patient was started on heparin drip.  Further work-up showed left lower extremity femoral popliteal DVT extending into calf veins.  Echo was unremarkable.  Left ventricle has normal systolic function with an ejection fraction of 60 to 65%.  Patient was discharged on Eliquis. Patient denies any immobilization factors prior to the presentation, except that patient was hospitalized on 07/02/2019 and discharged on 07/06/2019 For hypokalemia and nausea, acute diverticulitis.  Patient received DVT prophylaxis during the hospitalization. She currently lives in [place].  She does not know her Eliquis dose. History of previous thrombosis event: Denies Family history of thrombosis: Denies  Age appropriate cancer screening:  Today she reports that shortness of breath has improved.  Feeling well. No bleeding events  Positive for easy bruising.  INTERVAL HISTORY Erin Sutton is a 79 y.o. female who has above history reviewed by me today presents for follow up visit for management of pulmonary embolism Problems and complaints are listed below: Patient reports feeling  well today.  Denies any shortness of breath.  Patient is not taking oral iron supplementation because of GI side effects.  No new complaints today. Chronic fatigue level at baseline. Review of Systems  Constitutional: Positive for fatigue. Negative for appetite change, chills and fever.  HENT:   Negative for hearing loss and voice change.   Eyes: Negative for eye problems.  Respiratory: Negative for chest tightness and cough.   Cardiovascular: Negative for chest pain.  Gastrointestinal: Negative for abdominal distention, abdominal pain and blood in stool.  Endocrine: Negative for hot flashes.  Genitourinary: Negative for difficulty urinating and frequency.   Musculoskeletal: Negative for arthralgias.  Skin: Negative for itching and rash.  Neurological: Negative for extremity weakness.  Hematological: Negative for adenopathy. Bruises/bleeds easily.  Psychiatric/Behavioral: Negative for confusion.    MEDICAL HISTORY:  Past Medical History:  Diagnosis Date  . Anxiety   . Arthritis   . Chronic kidney disease   . Constipation   . GERD (gastroesophageal reflux disease)   . Hypertension   . Hypothyroidism   . Osteoporosis   . Sleep apnea   . Urinary incontinence     SURGICAL HISTORY: Past Surgical History:  Procedure Laterality Date  . BREAST BIOPSY Right    neg/stereo  . BREAST BIOPSY Left    neg/stereo  . BREAST EXCISIONAL BIOPSY Right    neg  . CATARACT EXTRACTION, BILATERAL    . COLONOSCOPY WITH PROPOFOL N/A 07/02/2015   Procedure: COLONOSCOPY WITH PROPOFOL;  Surgeon: Manya Silvas, MD;  Location: [facility] ENDOSCOPY;  Service: Endoscopy;  Laterality: N/A;  . CORRECTION HAMMER TOE Right   . ESOPHAGOGASTRODUODENOSCOPY (EGD) WITH PROPOFOL N/A 07/02/2015   Procedure: ESOPHAGOGASTRODUODENOSCOPY (EGD) WITH PROPOFOL;  Surgeon:  Manya Silvas, MD;  Location: [facility] ENDOSCOPY;  Service: Endoscopy;  Laterality: N/A;  . JOINT REPLACEMENT Left    knee  . REPLACEMENT TOTAL KNEE Left   .  SKIN BIOPSY Right     SOCIAL HISTORY: Social History   Socioeconomic History  . Marital status: Widowed    Spouse name: Not on file  . Number of children: Not on file  . Years of education: Not on file  . Highest education level: Not on file  Occupational History  . Not on file  Tobacco Use  . Smoking status: Never Smoker  . Smokeless tobacco: Never Used  Substance and Sexual Activity  . Alcohol use: No  . Drug use: No  . Sexual activity: Not Currently  Other Topics Concern  . Not on file  Social History Narrative   Patient lives at home by herself.  Ambulates with a cane.   Has a daughter who lives in [place] Determinants of Health   Financial Resource Strain: Low Risk   . Difficulty of Paying Living Expenses: Not hard at all  Food Insecurity: Unknown  . Worried About Charity fundraiser in the Last Year: Patient refused  . Ran Out of Food in the Last Year: Patient refused  Transportation Needs: Unknown  . Lack of Transportation (Medical): Patient refused  . Lack of Transportation (Non-Medical): Patient refused  Physical Activity: Unknown  . Days of Exercise per Week: Patient refused  . Minutes of Exercise per Session: Patient refused  Stress: No Stress Concern Present  . Feeling of Stress : Only a little  Social Connections: Unknown  . Frequency of Communication with Friends and Family: Patient refused  . Frequency of Social Gatherings with Friends and Family: Patient refused  . Attends Religious Services: Patient refused  . Active Member of Clubs or Organizations: Patient refused  . Attends Archivist Meetings: Patient refused  . Marital Status: Patient refused  Intimate Partner Violence: Unknown  . Fear of Current or Ex-Partner: Patient refused  . Emotionally Abused: Patient refused  . Physically Abused: Patient refused  . Sexually Abused: Patient refused    FAMILY HISTORY: Family History  Problem Relation Age of Onset  .  Hypertension Mother   . Diabetes Mother   . Heart attack Mother   . Asthma Mother   . Peripheral vascular disease Mother   . Heart attack Father   . Hypertension Father   . Diabetes Father   . Colon cancer Maternal Aunt     ALLERGIES:  is allergic to ace inhibitors; levofloxacin; penicillin g; tape; amoxicillin; benicar [olmesartan]; codeine; codeine sulfate; levaquin [levofloxacin in d5w]; penicillin v potassium; and sulfa antibiotics.  MEDICATIONS:  Current Outpatient Medications  Medication Sig Dispense Refill  . amLODipine (NORVASC) 10 MG tablet Take 10 mg by mouth daily.    Marland Kitchen apixaban (ELIQUIS) 5 MG TABS tablet 10 mg po bid for 5 days, then 5 mg po bid. 60 tablet 1  . carvedilol (COREG) 12.5 MG tablet Take 12.5 mg by mouth 2 (two) times daily.    Marland Kitchen docusate sodium (COLACE) 100 MG capsule Take 1 capsule (100 mg total) by mouth 2 (two) times daily. 60 capsule 2  . feeding supplement, ENSURE ENLIVE, (ENSURE ENLIVE) LIQD Take 237 mLs by mouth 2 (two) times daily between meals. 237 mL 12  . isosorbide mononitrate (IMDUR) 30 MG 24 hr tablet Take 30 mg by mouth daily.    Marland Kitchen levothyroxine (SYNTHROID, LEVOTHROID) 50  MCG tablet Take 50 mcg by mouth daily before breakfast.    . LORazepam (ATIVAN) 2 MG tablet Take 2 mg by mouth every 8 (eight) hours as needed for anxiety.     . magnesium oxide (MAG-OX) 400 MG tablet Take 400 mg by mouth daily.    . pantoprazole (PROTONIX) 40 MG tablet Take 40 mg by mouth daily.    Vladimir Faster Glycol-Propyl Glycol (SYSTANE OP) Place 1 drop into both eyes 2 (two) times daily.    . potassium chloride SA (K-DUR,KLOR-CON) 20 MEQ tablet Take 20 mEq by mouth every morning.    . rosuvastatin (CRESTOR) 40 MG tablet Take 40 mg by mouth daily.    . sertraline (ZOLOFT) 100 MG tablet Take 100 mg by mouth at bedtime.     . simethicone (MYLICON) 80 MG chewable tablet Chew 1 tablet (80 mg total) by mouth every 6 (six) hours as needed for flatulence. 30 tablet 0  .  cholecalciferol (VITAMIN D3) 25 MCG (1000 UT) tablet Take 1,000 Units by mouth daily.    . cyanocobalamin (,VITAMIN B-12,) 1000 MCG/ML injection Inject 1,000 mcg into the muscle every 30 (thirty) days.    . ferrous fumarate (HEMOCYTE - 106 MG FE) 325 (106 Fe) MG TABS tablet     . ferrous sulfate 325 (65 FE) MG EC tablet TAKE 1 TABLET (325 MG TOTAL) BY MOUTH DAILY WITH BREAKFAST. (Patient not taking: Reported on 11/29/2019) 90 tablet 0  . ondansetron (ZOFRAN) 8 MG tablet Take 8 mg by mouth every 8 (eight) hours as needed.    . ondansetron (ZOFRAN-ODT) 4 MG disintegrating tablet      No current facility-administered medications for this visit.     PHYSICAL EXAMINATION: ECOG PERFORMANCE STATUS: 1 - Symptomatic but completely ambulatory Vitals:   11/29/19 1403  BP: (!) 141/76  Pulse: 67  Resp: 18  Temp: 98.6 F (37 C)  SpO2: 98%   Filed Weights   11/29/19 1403  Weight: 174 lb 6.4 oz (79.1 kg)    Physical Exam Constitutional:      General: She is not in acute distress.    Appearance: She is obese.     Comments: Sitting in wheelchair.  HENT:     Head: Normocephalic and atraumatic.  Eyes:     General: No scleral icterus.    Pupils: Pupils are equal, round, and reactive to light.  Cardiovascular:     Rate and Rhythm: Normal rate and regular rhythm.     Heart sounds: Normal heart sounds.  Pulmonary:     Effort: Pulmonary effort is normal. No respiratory distress.     Breath sounds: No wheezing.  Abdominal:     General: Bowel sounds are normal. There is no distension.     Palpations: Abdomen is soft. There is no mass.     Tenderness: There is no abdominal tenderness.  Musculoskeletal:        General: Swelling present. No deformity. Normal range of motion.     Cervical back: Normal range of motion and neck supple.  Skin:    General: Skin is warm and dry.     Findings: No erythema or rash.     Comments: Chronic lower extremity erythema bilaterally.  Neurological:     Mental  Status: She is alert and oriented to person, place, and time.     Cranial Nerves: No cranial nerve deficit.     Coordination: Coordination normal.  Psychiatric:        Mood and Affect:  Mood normal.     RADIOGRAPHIC STUDIES: I have personally reviewed the radiological images as listed and agreed with the findings in the report. No results found.   LABORATORY DATA:  I have reviewed the data as listed Lab Results  Component Value Date   WBC 6.9 11/29/2019   HGB 13.1 11/29/2019   HCT 41.7 11/29/2019   MCV 93.7 11/29/2019   PLT 243 11/29/2019   Recent Labs    07/15/19 2021 09/02/19 1138 11/29/19 1345  NA 135 140 138  K 3.9 3.5 4.5  CL 99 103 103  CO2 26 23 26   GLUCOSE 121* 103* 78  BUN 11 8 20   CREATININE 0.77 0.74 1.02*  CALCIUM 9.3 10.3 10.2  GFRNONAA >60 >60 53*  GFRAA >60 >60 >60  PROT 6.6 8.2* 7.5  ALBUMIN 3.0* 4.5 4.2  AST 33 25 20  ALT 13 12 10   ALKPHOS 53 48 47  BILITOT 0.7 0.7 0.5   Iron/TIBC/Ferritin/ %Sat    Component Value Date/Time   IRON 25 (L) 07/27/2019 1548   TIBC 341 07/27/2019 1548   FERRITIN 36 07/27/2019 1548   IRONPCTSAT 7 (L) 07/27/2019 1548       ASSESSMENT & PLAN:  1. History of pulmonary embolism   2. Acute deep vein thrombosis (DVT) of femoral vein of left lower extremity (HCC)   Labs are reviewed and discussed with patient. Patient tolerates anticoagulation with Eliquis 5 mg twice daily well. She has been on anticoagulation for approximately 4.5 months. Recommend patient to continue 5 mg twice daily to complete at least a total of 6 months for anticoagulation. After that, recommend patient to switch to Eliquis 2.5 mg twice daily for long-term anticoagulation maintenance. Patient has an appointment with vascular surgeon and will have another lower extremity ultrasound done.  Normocytic anemia  Her hemoglobin has completely resolved.  Hemoglobin 13.1 today. No need for additional IV iron or oral iron supplementation at this  point.  Orders Placed This Encounter  Procedures  . US Venous Img Lower Unilateral Left    Standing Status:   Future    Standing Expiration Date:   11/28/2020    Order Specific Question:   Reason for Exam (SYMPTOM  OR DIAGNOSIS REQUIRED)    Answer:   dvt follow up, history of DVT and PE    Order Specific Question:   Preferred imaging location?    Answer:    [facility]  . CBC with Differential    Standing Status:   Future    Standing Expiration Date:   11/28/2020  . Comprehensive metabolic panel    Standing Status:   Future    Standing Expiration Date:   11/28/2020  . Iron and TIBC    Standing Status:   Future    Standing Expiration Date:   11/28/2020  . Ferritin    Standing Status:   Future    Standing Expiration Date:   11/28/2020    All questions were answered. The patient knows to call the clinic with any problems questions or concerns.  Cc Baxter Hire, MD   Return of visit: 3 months.  Earlie Server, MD, PhD 11/30/2019

## 2019-12-05 ENCOUNTER — Telehealth: Payer: Self-pay | Admitting: *Deleted

## 2019-12-05 LAB — PROTHROMBIN GENE MUTATION

## 2019-12-05 LAB — FACTOR 5 LEIDEN

## 2019-12-05 NOTE — Telephone Encounter (Signed)
Patient informed what was in MD note.  Anything else she needs to know Dr. Tasia Catchings:   Her hemoglobin has completely resolved.  Hemoglobin 13.1 today. No need for additional IV iron or oral iron supplementation at this point.

## 2019-12-05 NOTE — Telephone Encounter (Signed)
Her creatinine slight higher than her baseline. Recommend her to improve oral hydration. No other additional advise. Thank you.

## 2019-12-05 NOTE — Telephone Encounter (Signed)
Patient called asking about her lab results and what they mean. Please return her call to go over results as her next appointment is not until March. CBC with Differential/Platelet Order: [id number] Status:  Final result  Visible to patient:  No (inaccessible in MyChart)  Next appt:  12/13/2019 at 01:00 PM in Vascular Surgery ([facility] VASC 2)  Dx:  Normocytic anemia; Chronic anticoagul...  Ref Range & Units 6 d ago  WBC 4.0 - 10.5 K/uL 6.9   RBC 3.87 - 5.11 MIL/uL 4.45   Hemoglobin 12.0 - 15.0 g/dL 13.1   HCT 36.0 - 46.0 % 41.7   MCV 80.0 - 100.0 fL 93.7   MCH 26.0 - 34.0 pg 29.4   MCHC 30.0 - 36.0 g/dL 31.4   RDW 11.5 - 15.5 % 15.4   Platelets 150 - 400 K/uL 243   nRBC 0.0 - 0.2 % 0.0   Neutrophils Relative % % 62   Neutro Abs 1.7 - 7.7 K/uL 4.3   Lymphocytes Relative % 23   Lymphs Abs 0.7 - 4.0 K/uL 1.6   Monocytes Relative % 11   Monocytes Absolute 0.1 - 1.0 K/uL 0.8   Eosinophils Relative % 3   Eosinophils Absolute 0.0 - 0.5 K/uL 0.2   Basophils Relative % 1   Basophils Absolute 0.0 - 0.1 K/uL 0.1   Immature Granulocytes % 0   Abs Immature Granulocytes 0.00 - 0.07 K/uL 0.02   Comment: Performed at [facility], [address] [place], [place] [address]  Resulting Agency  [facility] CLIN LAB      Specimen Collected: 11/29/19 13:45  Last Resulted: 11/29/19 13:56     Lab Flowsheet   Order Details   View Encounter   Lab and Collection Details   Routing   Result History         Other Results from 11/29/2019  Prothrombin gene mutation  Status:  Final result  Visible to patient:  No (scheduled for 12/05/2019 2:36 PM)  Next appt:  12/13/2019 at 01:00 PM in Vascular Surgery ([facility] VASC 2)  Dx:  Normocytic anemia; Chronic anticoagul... Order: [id number] Component 6 d ago  Recommendations-PTGENE: Comment   Comment: (NOTE)  NEGATIVE  No mutation identified.  Comment:  A point mutation ([id number]) in the factor II (prothrombin) gene is  the second most common cause  of inherited thrombophilia. The  incidence of this mutation in the [place] Caucasian population is about  2% and in the [place] American population it is approximately 0.5%.  This mutation is rare in the [place] and Native American population.  Being heterozygous for a prothrombin mutation increases the risk for  developing venous thrombosis about 2 to 3 times above the general  population risk. Being homozygous for the prothrombin gene mutation  increases the relative risk for venous thrombosis further, although  it is not yet known how much further the risk is increased. In women  heterozygous for the prothrombin gene mutation, the use of estrogen  containing oral contraceptives increases the relative risk of venous  thrombosis about 16 times and the risk of developing cerebral  thrombosis is also significantly increased. In pregnancy the  prothrombin gene mutation increases risk for venous thrombosis and  may increase risk for stillbirth, placental abruption, pre-eclampsia  and fetal growth restriction. If the patient possesses two or more  congenital or acquired thrombophilic risk factors, the risk for  thrombosis may rise to more than the sum of the risk ratios for the  individual mutations. This  assay detects only the prothrombin G20210A  mutation and does not measure genetic abnormalities elsewhere in the  genome. Other thrombotic risk factors may be pursued through  systematic clinical laboratory analysis. These factors include the  R506Q (Leiden) mutation in the Factor V gene, plasma homocysteine  levels, as well as testing for deficiencies of antithrombin III,  protein C and protein S.  Genetic Counselors are available for health care providers  to discuss results at 1-800-345-GENE [phone]).  Methodology:  DNA analysis of the Factor II gene was performed by PCR  amplification followed by restriction analysis. The  diagnostic sensitivity is >99% for both. All the tests must  be  combined with clinical information for the most accurate  interpretation. Molecular-based testing is highly accurate,  but as in any laboratory test, diagnostic errors may occur.  This test was developed and its performance characteristics  determined by [facility]. It has not been cleared or approved  by the Food and Drug Administration.  Poort SR, et al. Blood. 1996; 30:0762-2633.  Varga EA. Circulation. 2004; 354:T62-B63.  Mervin Hack, et [place];  19:700-703.  Allison Quarry, PhD, [facility]  Ruben Reason, PhD, [facility]  Ileene Hutchinson, PhD, [facility]  Alfredo Bach, PhD, [facility]  Norva Riffle, PhD, [facility]  Earlean Polka, PhD, [facility]  Performed At: [facility]  [address] [place], [place] [id number]  Katina Degree MDPhD [id number]   Resulting Agency [facility] CLIN LAB      Specimen Collected: 11/29/19 13:45  Last Resulted: 12/05/19 13:36     Lab Flowsheet   Order Details   View Encounter   Lab and Collection Details   Routing   Result History           Factor 5 leiden  Status:  Final result  Visible to patient:  No (scheduled for 12/05/2019 2:36 PM)  Next appt:  12/13/2019 at 01:00 PM in Vascular Surgery ([facility] VASC 2)  Dx:  Normocytic anemia; Chronic anticoagul... Order: [id number] Component 6 d ago  Recommendations-F5LEID: Comment   Comment: (NOTE)  Result: Negative (no mutation found)  Factor V Leiden is a specific mutation (R506Q) in the factor  V gene that is associated with an increased risk of venous  thrombosis. Factor V Leiden is more resistant to  inactivation by activated protein C. As a result, factor V  persists in the circulation leading to a mild hyper-  coagulable state. The Leiden mutation accounts for 90% -  95% of APC resistance. Factor V Leiden has been reported in  patients with deep vein thrombosis, pulmonary embolus,  central retinal vein occlusion, cerebral sinus thrombosis  and hepatic vein  thrombosis. Other risk factors to be  considered in the workup for venous thrombosis include the  G20210A mutation in the factor II (prothrombin) gene,  protein S and C deficiency, and antithrombin deficiencies.  Anticardiolipin antibody and lupus anticoagulant analysis  may be appropriate for certain patients, as well as  homocysteine levels.  Contact your local [facility] for information on how to order  additional testing if desired.  **Genetic counselors are available for health care providers to**  discuss results at 1-800-345-GENE [phone]).  Methodology:  DNA analysis of the Factor V gene was performed by allele-specific  PCR. The diagnostic sensitivity and specificity is >99% for both.  Molecular-based testing is highly accurate, but as in any  laboratory test, diagnostic errors may occur. All test results must  be combined with clinical information for the  most accurate  interpretation.  This test was developed and its performance characteristics  determined by [facility]. It has not been cleared or approved by the  Food and Drug Administration.  References:  Voelkerding K (1996). Clin Lab Med [id number].  Allison Quarry, PhD, [facility]  Ruben Reason, PhD, [facility]  Ileene Hutchinson, PhD, [facility]  Alfredo Bach, PhD, [facility]  Norva Riffle, PhD, [facility]  Earlean Polka PhD, [facility]  Performed At: [facility]  [address] [place], [place] [id number]  Katina Degree MDPhD [id number]:[id number]   Resulting Agency [facility] CLIN LAB      Specimen Collected: 11/29/19 13:45  Last Resulted: 12/05/19 13:36

## 2019-12-13 ENCOUNTER — Other Ambulatory Visit: Payer: Self-pay

## 2019-12-13 ENCOUNTER — Ambulatory Visit (INDEPENDENT_AMBULATORY_CARE_PROVIDER_SITE_OTHER): Payer: Medicare PPO | Admitting: Vascular Surgery

## 2019-12-13 ENCOUNTER — Encounter (INDEPENDENT_AMBULATORY_CARE_PROVIDER_SITE_OTHER): Payer: Self-pay | Admitting: Vascular Surgery

## 2019-12-13 ENCOUNTER — Ambulatory Visit (INDEPENDENT_AMBULATORY_CARE_PROVIDER_SITE_OTHER): Payer: Medicare PPO

## 2019-12-13 VITALS — BP 177/85 | HR 71 | Resp 15 | Wt 170.6 lb

## 2019-12-13 DIAGNOSIS — I82402 Acute embolism and thrombosis of unspecified deep veins of left lower extremity: Secondary | ICD-10-CM | POA: Diagnosis not present

## 2019-12-13 DIAGNOSIS — I10 Essential (primary) hypertension: Secondary | ICD-10-CM

## 2019-12-13 DIAGNOSIS — I82512 Chronic embolism and thrombosis of left femoral vein: Secondary | ICD-10-CM | POA: Diagnosis not present

## 2019-12-13 DIAGNOSIS — I2699 Other pulmonary embolism without acute cor pulmonale: Secondary | ICD-10-CM

## 2019-12-13 NOTE — Progress Notes (Signed)
MRN : [id number]  Erin Sutton is a 79 y.o. (Feb 06, 1941) female who presents with chief complaint of  Chief Complaint  Patient presents with  . Follow-up    ultrasound follow up  .  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  Several months ago, she was admitted to the hospital with extensive left lower extremity DVT and pulmonary embolus.  She elected to have no intervention performed.  She still has some mild fatigue and shortness of breath with activity although this has been stable and not worsened over the past couple of months.  Her leg swelling is very mild at this point.  She has been wearing compression on a daily basis.  Her duplex today shows now chronic appearing left lower extremity DVT with some improvement from her previous study.  She has tolerated anticoagulation and continues to take 5 mg twice daily of Eliquis.  She is seeing hematology as well.  Current Outpatient Medications  Medication Sig Dispense Refill  . amLODipine (NORVASC) 10 MG tablet Take 10 mg by mouth daily.    Marland Kitchen apixaban (ELIQUIS) 5 MG TABS tablet 10 mg po bid for 5 days, then 5 mg po bid. 60 tablet 1  . carvedilol (COREG) 12.5 MG tablet Take 12.5 mg by mouth 2 (two) times daily.    . cholecalciferol (VITAMIN D3) 25 MCG (1000 UT) tablet Take 1,000 Units by mouth daily.    . cyanocobalamin (,VITAMIN B-12,) 1000 MCG/ML injection Inject 1,000 mcg into the muscle every 30 (thirty) days.    Marland Kitchen docusate sodium (COLACE) 100 MG capsule Take 1 capsule (100 mg total) by mouth 2 (two) times daily. 60 capsule 2  . ferrous fumarate (HEMOCYTE - 106 MG FE) 325 (106 Fe) MG TABS tablet     . isosorbide mononitrate (IMDUR) 30 MG 24 hr tablet Take 30 mg by mouth daily.    Marland Kitchen levothyroxine (SYNTHROID, LEVOTHROID) 50 MCG tablet Take 50 mcg by mouth daily before breakfast.    . LORazepam (ATIVAN) 2 MG tablet Take 2 mg by mouth every 8 (eight) hours as needed for anxiety.     . magnesium  oxide (MAG-OX) 400 MG tablet Take 400 mg by mouth daily.    . ondansetron (ZOFRAN-ODT) 4 MG disintegrating tablet     . pantoprazole (PROTONIX) 40 MG tablet Take 40 mg by mouth daily.    Vladimir Faster Glycol-Propyl Glycol (SYSTANE OP) Place 1 drop into both eyes 2 (two) times daily.    . potassium chloride SA (K-DUR,KLOR-CON) 20 MEQ tablet Take 20 mEq by mouth every morning.    . rosuvastatin (CRESTOR) 40 MG tablet Take 40 mg by mouth daily.    . sertraline (ZOLOFT) 100 MG tablet Take 100 mg by mouth at bedtime.     . simethicone (MYLICON) 80 MG chewable tablet Chew 1 tablet (80 mg total) by mouth every 6 (six) hours as needed for flatulence. 30 tablet 0  . feeding supplement, ENSURE ENLIVE, (ENSURE ENLIVE) LIQD Take 237 mLs by mouth 2 (two) times daily between meals. (Patient not taking: Reported on 12/13/2019) 237 mL 12  . ferrous sulfate 325 (65 FE) MG EC tablet TAKE 1 TABLET (325 MG TOTAL) BY MOUTH DAILY WITH BREAKFAST. (Patient not taking: Reported on 11/29/2019) 90 tablet 0  . ondansetron (ZOFRAN) 8 MG tablet Take 8 mg by mouth every 8 (eight) hours as needed.     No current facility-administered medications for this visit.  Past Medical History:  Diagnosis Date  . Anxiety   . Arthritis   . Chronic kidney disease   . Constipation   . GERD (gastroesophageal reflux disease)   . Hypertension   . Hypothyroidism   . Osteoporosis   . Sleep apnea   . Urinary incontinence     Past Surgical History:  Procedure Laterality Date  . BREAST BIOPSY Right    neg/stereo  . BREAST BIOPSY Left    neg/stereo  . BREAST EXCISIONAL BIOPSY Right    neg  . CATARACT EXTRACTION, BILATERAL    . COLONOSCOPY WITH PROPOFOL N/A 07/02/2015   Procedure: COLONOSCOPY WITH PROPOFOL;  Surgeon: Manya Silvas, MD;  Location: [facility] ENDOSCOPY;  Service: Endoscopy;  Laterality: N/A;  . CORRECTION HAMMER TOE Right   . ESOPHAGOGASTRODUODENOSCOPY (EGD) WITH PROPOFOL N/A 07/02/2015   Procedure:  ESOPHAGOGASTRODUODENOSCOPY (EGD) WITH PROPOFOL;  Surgeon: Manya Silvas, MD;  Location: [facility] ENDOSCOPY;  Service: Endoscopy;  Laterality: N/A;  . JOINT REPLACEMENT Left    knee  . REPLACEMENT TOTAL KNEE Left   . SKIN BIOPSY Right     Social History Social History   Tobacco Use  . Smoking status: Never Smoker  . Smokeless tobacco: Never Used  Substance Use Topics  . Alcohol use: No  . Drug use: No    Family History  Problem Relation Age of Onset  . Hypertension Mother   . Diabetes Mother   . Heart attack Mother   . Asthma Mother   . Peripheral vascular disease Mother   . Heart attack Father   . Hypertension Father   . Diabetes Father   . Colon cancer Maternal Aunt      Allergies  Allergen Reactions  . Ace Inhibitors Cough  . Levofloxacin   . Penicillin G Other (See Comments)  . Tape Other (See Comments)    Other reaction(s): Other (See Comments)  . Amoxicillin Rash    Has patient had a PCN reaction causing immediate rash, facial/tongue/throat swelling, SOB or lightheadedness with hypotension: No Has patient had a PCN reaction causing severe rash involving mucus membranes or skin necrosis: No Has patient had a PCN reaction that required hospitalization: No Has patient had a PCN reaction occurring within the last 10 years: No If all of the above answers are "NO", then may proceed with Cephalosporin use.   Marland Kitchen Benicar [Olmesartan] Rash    Itching rash  . Codeine Rash  . Codeine Sulfate Rash  . Levaquin [Levofloxacin In D5w] Rash    Alters mental status  . Penicillin V Potassium Rash    Has patient had a PCN reaction causing immediate rash, facial/tongue/throat swelling, SOB or lightheadedness with hypotension: No Has patient had a PCN reaction causing severe rash involving mucus membranes or skin necrosis: No Has patient had a PCN reaction that required hospitalization: No Has patient had a PCN reaction occurring within the last 10 years: No If all of the above  answers are "NO", then may proceed with Cephalosporin use.   . Sulfa Antibiotics Rash    REVIEW OF SYSTEMS (Negative unless checked)  Constitutional: [] ?Weight loss  [] ?Fever  [] ?Chills Cardiac: [] ?Chest pain   [] ?Chest pressure   [] ?Palpitations   [] ?Shortness of breath when laying flat   [] ?Shortness of breath at rest   [x] ?Shortness of breath with exertion. Vascular:  [] ?Pain in legs with walking   [] ?Pain in legs at rest   [] ?Pain in legs when laying flat   [] ?Claudication   [] ?Pain in feet  when walking  [] ?Pain in feet at rest  [] ?Pain in feet when laying flat   [x] ?History of DVT   [x] ?Phlebitis   [x] ?Swelling in legs   [] ?Varicose veins   [] ?Non-healing ulcers Pulmonary:   [] ?Uses home oxygen   [] ?Productive cough   [] ?Hemoptysis   [] ?Wheeze  [] ?COPD   [] ?Asthma Neurologic:  [] ?Dizziness  [] ?Blackouts   [] ?Seizures   [] ?History of stroke   [] ?History of TIA  [] ?Aphasia   [] ?Temporary blindness   [] ?Dysphagia   [] ?Weakness or numbness in arms   [] ?Weakness or numbness in legs Musculoskeletal:  [x] ?Arthritis   [] ?Joint swelling   [x] ?Joint pain   [] ?Low back pain Hematologic:  [] ?Easy bruising  [] ?Easy bleeding   [] ?Hypercoagulable state   [] ?Anemic   Gastrointestinal:  [] ?Blood in stool   [] ?Vomiting blood  [x] ?Gastroesophageal reflux/heartburn   [] ?Abdominal pain Genitourinary:  [] ?Chronic kidney disease   [] ?Difficult urination  [] ?Frequent urination  [] ?Burning with urination   [] ?Hematuria Skin:  [] ?Rashes   [] ?Ulcers   [] ?Wounds Psychological:  [] ?History of anxiety   [] ? History of major depression.  Physical Examination  BP (!) 177/85 (BP Location: Right Arm)   Pulse 71   Resp 15   Wt 170 lb 9.6 oz (77.4 kg)   BMI 33.32 kg/m  Gen:  WD/WN, NAD Head: [place]/AT, No temporalis wasting. Ear/Nose/Throat: Hearing grossly intact, nares w/o erythema or drainage Eyes: Conjunctiva clear. Sclera non-icteric Neck: Supple.  Trachea midline Pulmonary:  Good air movement, no use of  accessory muscles.  Cardiac: RRR, no JVD Vascular:  Vessel Right Left  Radial Palpable Palpable                   Musculoskeletal: M/S 5/5 throughout.  No deformity or atrophy.  Trace left lower extremity edema. Neurologic: Sensation grossly intact in extremities.  Symmetrical.  Speech is fluent.  Psychiatric: Judgment intact, Mood & affect appropriate for pt's clinical situation. Dermatologic: No rashes or ulcers noted.  No cellulitis or open wounds.       Labs Recent Results (from the past 2160 hour(s))  Prothrombin gene mutation     Status: None   Collection Time: 11/29/19  1:45 PM  Result Value Ref Range   Recommendations-PTGENE: Comment     Comment: (NOTE) NEGATIVE No mutation identified. Comment: A point mutation (G20210A) in the factor II (prothrombin) gene is the second most common cause of inherited thrombophilia. The incidence of this mutation in the [place] Caucasian population is about 2% and in the [place] American population it is approximately 0.5%. This mutation is rare in the [place] and Native American population. Being heterozygous for a prothrombin mutation increases the risk for developing venous thrombosis about 2 to 3 times above the general population risk. Being homozygous for the prothrombin gene mutation increases the relative risk for venous thrombosis further, although it is not yet known how much further the risk is increased. In women heterozygous for the prothrombin gene mutation, the use of estrogen containing oral contraceptives increases the relative risk of venous thrombosis about 16 times and the risk of developing cerebral thrombosis is also significantly increased. In pregnancy the pr othrombin gene mutation increases risk for venous thrombosis and may increase risk for stillbirth, placental abruption, pre-eclampsia and fetal growth restriction. If the patient possesses two or more congenital or acquired thrombophilic risk factors, the  risk for thrombosis may rise to more than the sum of the risk ratios for the individual mutations. This assay detects only the prothrombin G20210A mutation  and does not measure genetic abnormalities elsewhere in the genome. Other thrombotic risk factors may be pursued through systematic clinical laboratory analysis. These factors include the R506Q (Leiden) mutation in the Factor V gene, plasma homocysteine levels, as well as testing for deficiencies of antithrombin III, protein C and protein S. Genetic Counselors are available for health care providers to discuss results at [phone]-GENE [phone]). Methodology: DNA analysis of the Factor II gene was performed by PCR amplification followed by restriction analysis. The di agnostic sensitivity is >99% for both. All the tests must be combined with clinical information for the most accurate interpretation. Molecular-based testing is highly accurate, but as in any laboratory test, diagnostic errors may occur. This test was developed and its performance characteristics determined by [facility]. It has not been cleared or approved by the Food and Drug Administration. Poort SR, et al. Blood. 1996; 26:3785-8850. Varga EA. Circulation. 2004; 277:A12-I78. Mervin Hack, et Burnett; 19:700-703. Allison Quarry, PhD, [facility] Ruben Reason, PhD, [facility] Ileene Hutchinson, PhD, [facility] Alfredo Bach, PhD, [facility] Norva Riffle, PhD, [facility] Earlean Polka, PhD, [facility] Performed At: [facility] [place], [place] [id number] Katina Degree MDPhD [id number]   Factor 5 leiden     Status: None   Collection Time: 11/29/19  1:45 PM  Result Value Ref Range   Recommendations-F5LEID: Comment     Comment: (NOTE) Result:  Negative (no mutation found) Factor V Leiden is a specific mutation (R506Q) in the factor V gene that is associated with an increased risk of venous thrombosis. Factor V Leiden is more resistant  to inactivation by activated protein C.  As a result, factor V persists in the circulation leading to a mild hyper- coagulable state.  The Leiden mutation accounts for 90% - 95% of APC resistance.  Factor V Leiden has been reported in patients with deep vein thrombosis, pulmonary embolus, central retinal vein occlusion, cerebral sinus thrombosis and hepatic vein thrombosis. Other risk factors to be considered in the workup for venous thrombosis include the G20210A mutation in the factor II (prothrombin) gene, protein S and C deficiency, and antithrombin deficiencies. Anticardiolipin antibody and lupus anticoagulant analysis may be appropriate for certain patients, as well as homocysteine levels. Contact your local [facility] for information on how to order additi onal testing if desired. **Genetic counselors are available for health care providers to**  discuss results at [phone]-GENE [phone]). Methodology: DNA analysis of the Factor V gene was performed by allele-specific PCR. The diagnostic sensitivity and specificity is >99% for both. Molecular-based testing is highly accurate, but as in any laboratory test, diagnostic errors may occur. All test results must be combined with clinical information for the most accurate interpretation. This test was developed and its performance characteristics determined by [facility]. It has not been cleared or approved by the Food and Drug Administration. References: Voelkerding K (1996).  Clin Lab Med [phone]. Allison Quarry, PhD, [facility] Ruben Reason, PhD, [facility] Ileene Hutchinson, PhD, [facility] Alfredo Bach, PhD, [facility] Norva Riffle, PhD, Leeper PhD, [facility] Performed At: [facility] [address] [place], [place] [id number] Katina Degree MDPhD [id number]   Comprehensive metabolic panel     Status: Abnormal   Collection Time: 11/29/19  1:45 PM  Result Value Ref Range   Sodium 138 135 - 145 mmol/L   Potassium 4.5 3.5  - 5.1 mmol/L   Chloride 103 98 - 111 mmol/L   CO2 26 22 - 32 mmol/L  Glucose, Bld 78 70 - 99 mg/dL   BUN 20 8 - 23 mg/dL   Creatinine, Ser 1.02 (H) 0.44 - 1.00 mg/dL   Calcium 10.2 8.9 - 10.3 mg/dL   Total Protein 7.5 6.5 - 8.1 g/dL   Albumin 4.2 3.5 - 5.0 g/dL   AST 20 15 - 41 U/L   ALT 10 0 - 44 U/L   Alkaline Phosphatase 47 38 - 126 U/L   Total Bilirubin 0.5 0.3 - 1.2 mg/dL   GFR calc non Af Amer 53 (L) >60 mL/min   GFR calc Af Amer >60 >60 mL/min   Anion gap 9 5 - 15    Comment: Performed at [facility], [address] [place], [place] [address]  CBC with Differential/Platelet     Status: None   Collection Time: 11/29/19  1:45 PM  Result Value Ref Range   WBC 6.9 4.0 - 10.5 K/uL   RBC 4.45 3.87 - 5.11 MIL/uL   Hemoglobin 13.1 12.0 - 15.0 g/dL   HCT 41.7 36.0 - 46.0 %   MCV 93.7 80.0 - 100.0 fL   MCH 29.4 26.0 - 34.0 pg   MCHC 31.4 30.0 - 36.0 g/dL   RDW 15.4 11.5 - 15.5 %   Platelets 243 150 - 400 K/uL   nRBC 0.0 0.0 - 0.2 %   Neutrophils Relative % 62 %   Neutro Abs 4.3 1.7 - 7.7 K/uL   Lymphocytes Relative 23 %   Lymphs Abs 1.6 0.7 - 4.0 K/uL   Monocytes Relative 11 %   Monocytes Absolute 0.8 0.1 - 1.0 K/uL   Eosinophils Relative 3 %   Eosinophils Absolute 0.2 0.0 - 0.5 K/uL   Basophils Relative 1 %   Basophils Absolute 0.1 0.0 - 0.1 K/uL   Immature Granulocytes 0 %   Abs Immature Granulocytes 0.02 0.00 - 0.07 K/uL    Comment: Performed at [facility], [address] [place], [place] [address]    Radiology No results found.  Assessment/Plan HTN (hypertension) blood pressure control important in reducing the progression of atherosclerotic disease. On appropriate oral medications.   Bilateral pulmonary embolism (HCC) Tolerating anticoagulation with minimal symptoms at this point even though it was a fairly large PE.  Continue anticoagulation  Left leg DVT (HCC) Her duplex today shows now chronic appearing left lower extremity DVT with  some improvement from her previous study. Her postphlebitic symptoms are well controlled in large part due to her compression stocking use on a daily basis and continued anticoagulation.  She should continue to use compression stockings to limit her postphlebitic symptoms going forward.  At this point, she will continue anticoagulation and will follow up with hematology in terms of the duration of anticoagulation long-term.  I can see her back on an as-needed basis.    Leotis Pain, MD  12/13/2019 2:27 PM    This note was created with Dragon medical transcription system.  Any errors from dictation are purely unintentional

## 2019-12-13 NOTE — Assessment & Plan Note (Signed)
Her duplex today shows now chronic appearing left lower extremity DVT with some improvement from her previous study. Her postphlebitic symptoms are well controlled in large part due to her compression stocking use on a daily basis and continued anticoagulation.  She should continue to use compression stockings to limit her postphlebitic symptoms going forward.  At this point, she will continue anticoagulation and will follow up with hematology in terms of the duration of anticoagulation long-term.  I can see her back on an as-needed basis.

## 2019-12-31 ENCOUNTER — Other Ambulatory Visit: Payer: Self-pay | Admitting: Oncology

## 2020-01-18 ENCOUNTER — Ambulatory Visit: Payer: Medicare PPO

## 2020-01-27 ENCOUNTER — Encounter (INDEPENDENT_AMBULATORY_CARE_PROVIDER_SITE_OTHER): Payer: Self-pay

## 2020-01-27 ENCOUNTER — Ambulatory Visit (INDEPENDENT_AMBULATORY_CARE_PROVIDER_SITE_OTHER): Payer: Self-pay | Admitting: Nurse Practitioner

## 2020-01-30 ENCOUNTER — Ambulatory Visit: Payer: Medicare PPO | Admitting: Oncology

## 2020-01-30 ENCOUNTER — Other Ambulatory Visit: Payer: Medicare PPO

## 2020-02-02 ENCOUNTER — Encounter: Payer: Self-pay | Admitting: Oncology

## 2020-02-02 ENCOUNTER — Inpatient Hospital Stay: Payer: Medicare Other | Admitting: Oncology

## 2020-02-02 ENCOUNTER — Inpatient Hospital Stay: Payer: Medicare Other | Attending: Oncology

## 2020-02-02 VITALS — BP 150/80 | HR 69 | Wt 172.2 lb

## 2020-02-02 DIAGNOSIS — N189 Chronic kidney disease, unspecified: Secondary | ICD-10-CM | POA: Diagnosis not present

## 2020-02-02 DIAGNOSIS — I82512 Chronic embolism and thrombosis of left femoral vein: Secondary | ICD-10-CM | POA: Insufficient documentation

## 2020-02-02 DIAGNOSIS — Z86711 Personal history of pulmonary embolism: Secondary | ICD-10-CM | POA: Diagnosis not present

## 2020-02-02 DIAGNOSIS — K219 Gastro-esophageal reflux disease without esophagitis: Secondary | ICD-10-CM | POA: Insufficient documentation

## 2020-02-02 DIAGNOSIS — Z7901 Long term (current) use of anticoagulants: Secondary | ICD-10-CM | POA: Insufficient documentation

## 2020-02-02 DIAGNOSIS — E611 Iron deficiency: Secondary | ICD-10-CM | POA: Diagnosis not present

## 2020-02-02 DIAGNOSIS — F419 Anxiety disorder, unspecified: Secondary | ICD-10-CM | POA: Insufficient documentation

## 2020-02-02 DIAGNOSIS — Z79899 Other long term (current) drug therapy: Secondary | ICD-10-CM | POA: Insufficient documentation

## 2020-02-02 DIAGNOSIS — G4730 Sleep apnea, unspecified: Secondary | ICD-10-CM | POA: Diagnosis not present

## 2020-02-02 DIAGNOSIS — R7989 Other specified abnormal findings of blood chemistry: Secondary | ICD-10-CM | POA: Diagnosis not present

## 2020-02-02 DIAGNOSIS — I129 Hypertensive chronic kidney disease with stage 1 through stage 4 chronic kidney disease, or unspecified chronic kidney disease: Secondary | ICD-10-CM | POA: Insufficient documentation

## 2020-02-02 DIAGNOSIS — R5383 Other fatigue: Secondary | ICD-10-CM | POA: Diagnosis not present

## 2020-02-02 DIAGNOSIS — I82412 Acute embolism and thrombosis of left femoral vein: Secondary | ICD-10-CM

## 2020-02-02 DIAGNOSIS — E039 Hypothyroidism, unspecified: Secondary | ICD-10-CM | POA: Diagnosis not present

## 2020-02-02 DIAGNOSIS — M1990 Unspecified osteoarthritis, unspecified site: Secondary | ICD-10-CM | POA: Insufficient documentation

## 2020-02-02 DIAGNOSIS — M810 Age-related osteoporosis without current pathological fracture: Secondary | ICD-10-CM | POA: Diagnosis not present

## 2020-02-02 LAB — CBC WITH DIFFERENTIAL/PLATELET
Abs Immature Granulocytes: 0.01 10*3/uL (ref 0.00–0.07)
Basophils Absolute: 0.1 10*3/uL (ref 0.0–0.1)
Basophils Relative: 1 %
Eosinophils Absolute: 0.2 10*3/uL (ref 0.0–0.5)
Eosinophils Relative: 2 %
HCT: 41.1 % (ref 36.0–46.0)
Hemoglobin: 13 g/dL (ref 12.0–15.0)
Immature Granulocytes: 0 %
Lymphocytes Relative: 20 %
Lymphs Abs: 1.4 10*3/uL (ref 0.7–4.0)
MCH: 29.7 pg (ref 26.0–34.0)
MCHC: 31.6 g/dL (ref 30.0–36.0)
MCV: 94.1 fL (ref 80.0–100.0)
Monocytes Absolute: 0.6 10*3/uL (ref 0.1–1.0)
Monocytes Relative: 9 %
Neutro Abs: 4.8 10*3/uL (ref 1.7–7.7)
Neutrophils Relative %: 68 %
Platelets: 247 10*3/uL (ref 150–400)
RBC: 4.37 MIL/uL (ref 3.87–5.11)
RDW: 14.2 % (ref 11.5–15.5)
WBC: 7 10*3/uL (ref 4.0–10.5)
nRBC: 0 % (ref 0.0–0.2)

## 2020-02-02 LAB — COMPREHENSIVE METABOLIC PANEL
ALT: 12 U/L (ref 0–44)
AST: 23 U/L (ref 15–41)
Albumin: 4.6 g/dL (ref 3.5–5.0)
Alkaline Phosphatase: 47 U/L (ref 38–126)
Anion gap: 11 (ref 5–15)
BUN: 18 mg/dL (ref 8–23)
CO2: 25 mmol/L (ref 22–32)
Calcium: 9.8 mg/dL (ref 8.9–10.3)
Chloride: 102 mmol/L (ref 98–111)
Creatinine, Ser: 0.84 mg/dL (ref 0.44–1.00)
GFR calc Af Amer: >60 mL/min (ref >60)
GFR calc non Af Amer: >60 mL/min (ref >60)
Glucose, Bld: 97 mg/dL (ref 70–99)
Potassium: 3.8 mmol/L (ref 3.5–5.1)
Sodium: 138 mmol/L (ref 135–145)
Total Bilirubin: 0.6 mg/dL (ref 0.3–1.2)
Total Protein: 8.2 g/dL — ABNORMAL HIGH (ref 6.5–8.1)

## 2020-02-02 LAB — FERRITIN: Ferritin: 9 ng/mL — ABNORMAL LOW (ref 11–307)

## 2020-02-02 LAB — IRON AND TIBC
Iron: 55 ug/dL (ref 28–170)
Saturation Ratios: 12 % (ref 10.4–31.8)
TIBC: 448 ug/dL (ref 250–450)
UIBC: 393 ug/dL

## 2020-02-02 MED ORDER — APIXABAN 2.5 MG PO TABS: 2.5000 mg | ORAL_TABLET | Freq: Two times a day (BID) | ORAL | 0 refills | Status: DC

## 2020-02-02 NOTE — Progress Notes (Signed)
Patient does not offer any problems today.  

## 2020-02-03 NOTE — Progress Notes (Signed)
Hematology/Oncology follow up note [facility] [phone] [phone]   Patient Care Team: Baxter Hire, MD as PCP - General (Internal Medicine)  REFERRING PROVIDER: Baxter Hire, MD  CHIEF COMPLAINTS/REASON FOR VISIT:   follow up for pulmonary embolism.   HISTORY OF PRESENTING ILLNESS:  Erin Sutton is a 79 y.o. female who was seen in consultation at the request of Baxter Hire, MD for evaluation of pulmonary embolism.  Patient was diagnosed with acute pulmonary embolism on 07/14/2019.  Patient presented to emergency room for dyspnea.  Upon arriving to the emergency room, patient was mildly tachypneic, no hypoxia or tachycardia.  She also had chest pain and elevated d-dimer.  CT PE angiogram 07/16/2019 showed bilateral pulmonary embolism with evidence of right heart strain.  Patient was started on heparin drip.  Further work-up showed left lower extremity femoral popliteal DVT extending into calf veins.  Echo was unremarkable.  Left ventricle has normal systolic function with an ejection fraction of 60 to 65%.  Patient was discharged on Eliquis. Patient denies any immobilization factors prior to the presentation, except that patient was hospitalized on 07/02/2019 and discharged on 07/06/2019 For hypokalemia and nausea, acute diverticulitis.  Patient received DVT prophylaxis during the hospitalization. She currently lives in [place].  She does not know her Eliquis dose. History of previous thrombosis event: Denies Family history of thrombosis: Denies  Age appropriate cancer screening:  Today she reports that shortness of breath has improved.  Feeling well. No bleeding events  Positive for easy bruising.  INTERVAL HISTORY Erin Sutton is a 79 y.o. female who has above history reviewed by me today presents for follow up visit for management of pulmonary embolism Problems and complaints are listed below: Patient reports  feeling well.  Patient is on 5 mg twice daily  Denies any shortness of breath, chest pain.  Review of Systems  Constitutional: Positive for fatigue. Negative for appetite change, chills and fever.  HENT:   Negative for hearing loss and voice change.   Eyes: Negative for eye problems.  Respiratory: Negative for chest tightness and cough.   Cardiovascular: Negative for chest pain.  Gastrointestinal: Negative for abdominal distention, abdominal pain and blood in stool.  Endocrine: Negative for hot flashes.  Genitourinary: Negative for difficulty urinating and frequency.   Musculoskeletal: Negative for arthralgias.  Skin: Negative for itching and rash.  Neurological: Negative for extremity weakness.  Hematological: Negative for adenopathy. Bruises/bleeds easily.  Psychiatric/Behavioral: Negative for confusion.    MEDICAL HISTORY:  Past Medical History:  Diagnosis Date  . Anxiety   . Arthritis   . Chronic kidney disease   . Constipation   . GERD (gastroesophageal reflux disease)   . Hypertension   . Hypothyroidism   . Osteoporosis   . Sleep apnea   . Urinary incontinence     SURGICAL HISTORY: Past Surgical History:  Procedure Laterality Date  . BREAST BIOPSY Right    neg/stereo  . BREAST BIOPSY Left    neg/stereo  . BREAST EXCISIONAL BIOPSY Right    neg  . CATARACT EXTRACTION, BILATERAL    . COLONOSCOPY WITH PROPOFOL N/A 07/02/2015   Procedure: COLONOSCOPY WITH PROPOFOL;  Surgeon: Manya Silvas, MD;  Location: [facility] ENDOSCOPY;  Service: Endoscopy;  Laterality: N/A;  . CORRECTION HAMMER TOE Right   . ESOPHAGOGASTRODUODENOSCOPY (EGD) WITH PROPOFOL N/A 07/02/2015   Procedure: ESOPHAGOGASTRODUODENOSCOPY (EGD) WITH PROPOFOL;  Surgeon: Manya Silvas, MD;  Location: [facility] ENDOSCOPY;  Service: Endoscopy;  Laterality: N/A;  . JOINT REPLACEMENT Left    knee  . REPLACEMENT TOTAL KNEE Left   . SKIN BIOPSY Right     SOCIAL HISTORY: Social History   Socioeconomic History  .  Marital status: Widowed    Spouse name: Not on file  . Number of children: Not on file  . Years of education: Not on file  . Highest education level: Not on file  Occupational History  . Not on file  Tobacco Use  . Smoking status: Never Smoker  . Smokeless tobacco: Never Used  Substance and Sexual Activity  . Alcohol use: No  . Drug use: No  . Sexual activity: Not Currently  Other Topics Concern  . Not on file  Social History Narrative   Patient lives at home by herself.  Ambulates with a cane.   Has a daughter who lives in [place] Determinants of Health   Financial Resource Strain: Low Risk   . Difficulty of Paying Living Expenses: Not hard at all  Food Insecurity: Unknown  . Worried About Charity fundraiser in the Last Year: Patient refused  . Ran Out of Food in the Last Year: Patient refused  Transportation Needs: Unknown  . Lack of Transportation (Medical): Patient refused  . Lack of Transportation (Non-Medical): Patient refused  Physical Activity: Unknown  . Days of Exercise per Week: Patient refused  . Minutes of Exercise per Session: Patient refused  Stress: No Stress Concern Present  . Feeling of Stress : Only a little  Social Connections: Unknown  . Frequency of Communication with Friends and Family: Patient refused  . Frequency of Social Gatherings with Friends and Family: Patient refused  . Attends Religious Services: Patient refused  . Active Member of Clubs or Organizations: Patient refused  . Attends Archivist Meetings: Patient refused  . Marital Status: Patient refused  Intimate Partner Violence: Unknown  . Fear of Current or Ex-Partner: Patient refused  . Emotionally Abused: Patient refused  . Physically Abused: Patient refused  . Sexually Abused: Patient refused    FAMILY HISTORY: Family History  Problem Relation Age of Onset  . Hypertension Mother   . Diabetes Mother   . Heart attack Mother   . Asthma Mother   .  Peripheral vascular disease Mother   . Heart attack Father   . Hypertension Father   . Diabetes Father   . Colon cancer Maternal Aunt     ALLERGIES:  is allergic to ace inhibitors; levofloxacin; penicillin g; tape; amoxicillin; benicar [olmesartan]; codeine; codeine sulfate; levaquin [levofloxacin in d5w]; penicillin v potassium; and sulfa antibiotics.  MEDICATIONS:  Current Outpatient Medications  Medication Sig Dispense Refill  . amLODipine (NORVASC) 10 MG tablet Take 10 mg by mouth daily.    . carvedilol (COREG) 12.5 MG tablet Take 12.5 mg by mouth 2 (two) times daily.    . cyanocobalamin (,VITAMIN B-12,) 1000 MCG/ML injection Inject 1,000 mcg into the muscle every 30 (thirty) days.    Marland Kitchen docusate sodium (COLACE) 100 MG capsule Take 1 capsule (100 mg total) by mouth 2 (two) times daily. 60 capsule 2  . isosorbide mononitrate (IMDUR) 30 MG 24 hr tablet Take 30 mg by mouth daily.    Marland Kitchen levothyroxine (SYNTHROID, LEVOTHROID) 50 MCG tablet Take 50 mcg by mouth daily before breakfast.    . LORazepam (ATIVAN) 2 MG tablet Take 2 mg by mouth every 8 (eight) hours as needed for anxiety.     . ondansetron (  ZOFRAN-ODT) 4 MG disintegrating tablet     . pantoprazole (PROTONIX) 40 MG tablet Take 40 mg by mouth daily.    Vladimir Faster Glycol-Propyl Glycol (SYSTANE OP) Place 1 drop into both eyes 2 (two) times daily.    . potassium chloride SA (K-DUR,KLOR-CON) 20 MEQ tablet Take 20 mEq by mouth every morning.    . rosuvastatin (CRESTOR) 40 MG tablet Take 40 mg by mouth daily.    . sertraline (ZOLOFT) 100 MG tablet Take 100 mg by mouth at bedtime.     . simethicone (MYLICON) 80 MG chewable tablet Chew 1 tablet (80 mg total) by mouth every 6 (six) hours as needed for flatulence. 30 tablet 0  . apixaban (ELIQUIS) 2.5 MG TABS tablet Take 1 tablet (2.5 mg total) by mouth 2 (two) times daily. 360 tablet 0  . cholecalciferol (VITAMIN D3) 25 MCG (1000 UT) tablet Take 1,000 Units by mouth daily.    . feeding  supplement, ENSURE ENLIVE, (ENSURE ENLIVE) LIQD Take 237 mLs by mouth 2 (two) times daily between meals. (Patient not taking: Reported on 12/13/2019) 237 mL 12  . ferrous fumarate (HEMOCYTE - 106 MG FE) 325 (106 Fe) MG TABS tablet     . ferrous sulfate 325 (65 FE) MG EC tablet TAKE 1 TABLET (325 MG TOTAL) BY MOUTH DAILY WITH BREAKFAST. (Patient not taking: Reported on 02/02/2020) 90 tablet 0  . magnesium oxide (MAG-OX) 400 MG tablet Take 400 mg by mouth daily.    . ondansetron (ZOFRAN) 8 MG tablet Take 8 mg by mouth every 8 (eight) hours as needed.     No current facility-administered medications for this visit.     PHYSICAL EXAMINATION: ECOG PERFORMANCE STATUS: 1 - Symptomatic but completely ambulatory Vitals:   02/02/20 1050  BP: (!) 150/80  Pulse: 69   Filed Weights   02/02/20 1050  Weight: 172 lb 3.2 oz (78.1 kg)    Physical Exam Constitutional:      General: She is not in acute distress.    Appearance: She is obese.     Comments: Sitting in wheelchair.  HENT:     Head: Normocephalic and atraumatic.  Eyes:     General: No scleral icterus.    Pupils: Pupils are equal, round, and reactive to light.  Cardiovascular:     Rate and Rhythm: Normal rate and regular rhythm.     Heart sounds: Normal heart sounds.  Pulmonary:     Effort: Pulmonary effort is normal. No respiratory distress.     Breath sounds: No wheezing.  Abdominal:     General: Bowel sounds are normal. There is no distension.     Palpations: Abdomen is soft. There is no mass.     Tenderness: There is no abdominal tenderness.  Musculoskeletal:        General: Swelling present. No deformity. Normal range of motion.     Cervical back: Normal range of motion and neck supple.  Skin:    General: Skin is warm and dry.     Findings: No erythema or rash.     Comments: Chronic lower extremity erythema bilaterally.  Neurological:     Mental Status: She is alert and oriented to person, place, and time. Mental status is  at baseline.     Cranial Nerves: No cranial nerve deficit.     Coordination: Coordination normal.  Psychiatric:        Mood and Affect: Mood normal.     RADIOGRAPHIC STUDIES: I have personally reviewed the  radiological images as listed and agreed with the findings in the report. No results found.   LABORATORY DATA:  I have reviewed the data as listed Lab Results  Component Value Date   WBC 7.0 02/02/2020   HGB 13.0 02/02/2020   HCT 41.1 02/02/2020   MCV 94.1 02/02/2020   PLT 247 02/02/2020   Recent Labs    09/02/19 1138 11/29/19 1345 02/02/20 1025  NA 140 138 138  K 3.5 4.5 3.8  CL 103 103 102  CO2 23 26 25   GLUCOSE 103* 78 97  BUN 8 20 18   CREATININE 0.74 1.02* 0.84  CALCIUM 10.3 10.2 9.8  GFRNONAA >60 53* >60  GFRAA >60 >60 >60  PROT 8.2* 7.5 8.2*  ALBUMIN 4.5 4.2 4.6  AST 25 20 23   ALT 12 10 12   ALKPHOS 48 47 47  BILITOT 0.7 0.5 0.6   Iron/TIBC/Ferritin/ %Sat    Component Value Date/Time   IRON 55 02/02/2020 1025   TIBC 448 02/02/2020 1025   FERRITIN 9 (L) 02/02/2020 1025   IRONPCTSAT 12 02/02/2020 1025       ASSESSMENT & PLAN:  1. Chronic deep vein thrombosis (DVT) of femoral vein of left lower extremity (HCC)   2. Iron deficiency   3. History of pulmonary embolism   History of PE and chronic lower extremity DVT. Labs are reviewed and discussed with patient. 12/13/2019 ultrasound showed chronic left femoral and popliteal vein DVT. Patient has finished 6 months of anticoagulation with Eliquis 5 mg twice daily. Recommend patient to finish her current supply of Eliquis 5 mg, after that, decrease to Eliquis 2.5 mg twice daily as maintenance.  #Iron deficiency, Hemoglobin 13.  Recommend patient to continue oral iron supplementation.  # Elevated protein level, check SPEP.  Orders Placed This Encounter  Procedures  . CBC with Differential/Platelet    Standing Status:   Future    Standing Expiration Date:   02/01/2021  . Comprehensive metabolic  panel    Standing Status:   Future    Standing Expiration Date:   02/01/2021  . Kappa/lambda light chains    Standing Status:   Future    Standing Expiration Date:   02/01/2021  . Multiple Myeloma Panel (SPEP&IFE w/QIG)    Standing Status:   Future    Standing Expiration Date:   02/01/2021    All questions were answered. The patient knows to call the clinic with any problems questions or concerns.   Return of visit: 6 months.  Earlie Server, MD, PhD 02/03/2020

## 2020-02-09 ENCOUNTER — Encounter: Payer: Self-pay | Admitting: Emergency Medicine

## 2020-02-09 ENCOUNTER — Other Ambulatory Visit: Payer: Self-pay

## 2020-02-09 ENCOUNTER — Emergency Department
Admission: EM | Admit: 2020-02-09 | Discharge: 2020-02-09 | Disposition: A | Discharge status: Home / Self Care | Payer: Medicare Other | Attending: Emergency Medicine | Admitting: Emergency Medicine

## 2020-02-09 ENCOUNTER — Emergency Department: Payer: Medicare Other

## 2020-02-09 DIAGNOSIS — N189 Chronic kidney disease, unspecified: Secondary | ICD-10-CM | POA: Insufficient documentation

## 2020-02-09 DIAGNOSIS — I129 Hypertensive chronic kidney disease with stage 1 through stage 4 chronic kidney disease, or unspecified chronic kidney disease: Secondary | ICD-10-CM | POA: Diagnosis not present

## 2020-02-09 DIAGNOSIS — E039 Hypothyroidism, unspecified: Secondary | ICD-10-CM | POA: Diagnosis not present

## 2020-02-09 DIAGNOSIS — S59911A Unspecified injury of right forearm, initial encounter: Secondary | ICD-10-CM | POA: Diagnosis present

## 2020-02-09 DIAGNOSIS — Z7901 Long term (current) use of anticoagulants: Secondary | ICD-10-CM | POA: Insufficient documentation

## 2020-02-09 DIAGNOSIS — W010XXA Fall on same level from slipping, tripping and stumbling without subsequent striking against object, initial encounter: Secondary | ICD-10-CM | POA: Insufficient documentation

## 2020-02-09 DIAGNOSIS — Z79899 Other long term (current) drug therapy: Secondary | ICD-10-CM | POA: Insufficient documentation

## 2020-02-09 DIAGNOSIS — Y93H2 Activity, gardening and landscaping: Secondary | ICD-10-CM | POA: Insufficient documentation

## 2020-02-09 DIAGNOSIS — Y92007 Garden or yard of unspecified non-institutional (private) residence as the place of occurrence of the external cause: Secondary | ICD-10-CM | POA: Diagnosis not present

## 2020-02-09 DIAGNOSIS — S52501A Unspecified fracture of the lower end of right radius, initial encounter for closed fracture: Secondary | ICD-10-CM | POA: Insufficient documentation

## 2020-02-09 DIAGNOSIS — F0390 Unspecified dementia without behavioral disturbance: Secondary | ICD-10-CM | POA: Insufficient documentation

## 2020-02-09 DIAGNOSIS — Y999 Unspecified external cause status: Secondary | ICD-10-CM | POA: Insufficient documentation

## 2020-02-09 DIAGNOSIS — Z96653 Presence of artificial knee joint, bilateral: Secondary | ICD-10-CM | POA: Diagnosis not present

## 2020-02-09 DIAGNOSIS — W19XXXA Unspecified fall, initial encounter: Secondary | ICD-10-CM

## 2020-02-09 MED ORDER — HYDROCODONE-ACETAMINOPHEN 5-325 MG PO TABS: 1.0000 | ORAL_TABLET | Freq: Four times a day (QID) | ORAL | 0 refills | Status: DC | PRN

## 2020-02-09 MED ORDER — ACETAMINOPHEN 500 MG PO TABS
1000.0000 mg | ORAL_TABLET | Freq: Once | ORAL | Status: AC
  Administered 2020-02-09: 1000 mg via ORAL
  Filled 2020-02-09: qty 2

## 2020-02-09 NOTE — Discharge Instructions (Addendum)
Follow-up with [facility].  Please call tomorrow for an appointment for next Tuesday.  Dr. Mack Guise said he would like for you to see Dr. Peggye Ley.  Take Tylenol for pain as needed.  Take hydrocodone for pain that is not controlled by Tylenol. Keep the wrist elevated and iced Wear a sling as needed Return to the emergency department for any other injuries or if you are worsening

## 2020-02-09 NOTE — ED Triage Notes (Signed)
Presents vis EMS from home  States she fell while working in [place] on right forearm  Also has some discomfort in right ankle  Forearm is splinted per EMS
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
## 2020-02-09 NOTE — Consult Note (Signed)
Called by Ashok Cordia, PA in the ER regarding Ms. Erin Sutton a 79 year old female who fell in the garden today.  Patient injured her right wrist.  X-rays in the ED today show a comminuted fracture of the right distal radius with intra-articular extension.  Patient has mild dorsal angulation.  Patient is reported to have intact skin and is neurovascular intact.  I recommended the patient receive a splint and be referred to our office for further evaluation next Tuesday from our hand specialist.  The ER staff understood and agreed with this plan.

## 2020-04-13 ENCOUNTER — Emergency Department: Payer: Medicare Other

## 2020-04-13 ENCOUNTER — Other Ambulatory Visit: Payer: Self-pay

## 2020-04-13 ENCOUNTER — Inpatient Hospital Stay
Admission: EM | Admit: 2020-04-13 | Discharge: 2020-04-15 | DRG: 392 | Disposition: A | Discharge status: Home / Self Care | Payer: Medicare Other | Attending: Internal Medicine | Admitting: Internal Medicine

## 2020-04-13 DIAGNOSIS — Z7901 Long term (current) use of anticoagulants: Secondary | ICD-10-CM

## 2020-04-13 DIAGNOSIS — R112 Nausea with vomiting, unspecified: Secondary | ICD-10-CM | POA: Diagnosis present

## 2020-04-13 DIAGNOSIS — N6310 Unspecified lump in the right breast, unspecified quadrant: Secondary | ICD-10-CM | POA: Diagnosis present

## 2020-04-13 DIAGNOSIS — I10 Essential (primary) hypertension: Secondary | ICD-10-CM | POA: Diagnosis present

## 2020-04-13 DIAGNOSIS — F419 Anxiety disorder, unspecified: Secondary | ICD-10-CM | POA: Diagnosis present

## 2020-04-13 DIAGNOSIS — I160 Hypertensive urgency: Secondary | ICD-10-CM

## 2020-04-13 DIAGNOSIS — M810 Age-related osteoporosis without current pathological fracture: Secondary | ICD-10-CM | POA: Diagnosis present

## 2020-04-13 DIAGNOSIS — K219 Gastro-esophageal reflux disease without esophagitis: Secondary | ICD-10-CM | POA: Diagnosis present

## 2020-04-13 DIAGNOSIS — E785 Hyperlipidemia, unspecified: Secondary | ICD-10-CM | POA: Diagnosis present

## 2020-04-13 DIAGNOSIS — Z79899 Other long term (current) drug therapy: Secondary | ICD-10-CM

## 2020-04-13 DIAGNOSIS — I161 Hypertensive emergency: Secondary | ICD-10-CM | POA: Diagnosis present

## 2020-04-13 DIAGNOSIS — Z20822 Contact with and (suspected) exposure to covid-19: Secondary | ICD-10-CM | POA: Diagnosis present

## 2020-04-13 DIAGNOSIS — Z86718 Personal history of other venous thrombosis and embolism: Secondary | ICD-10-CM

## 2020-04-13 DIAGNOSIS — Z79890 Hormone replacement therapy (postmenopausal): Secondary | ICD-10-CM

## 2020-04-13 DIAGNOSIS — R1013 Epigastric pain: Secondary | ICD-10-CM | POA: Diagnosis not present

## 2020-04-13 DIAGNOSIS — E039 Hypothyroidism, unspecified: Secondary | ICD-10-CM | POA: Diagnosis present

## 2020-04-13 DIAGNOSIS — A084 Viral intestinal infection, unspecified: Principal | ICD-10-CM | POA: Diagnosis present

## 2020-04-13 DIAGNOSIS — E876 Hypokalemia: Secondary | ICD-10-CM

## 2020-04-13 DIAGNOSIS — F329 Major depressive disorder, single episode, unspecified: Secondary | ICD-10-CM | POA: Diagnosis present

## 2020-04-13 DIAGNOSIS — K529 Noninfective gastroenteritis and colitis, unspecified: Secondary | ICD-10-CM | POA: Diagnosis present

## 2020-04-13 DIAGNOSIS — Z96652 Presence of left artificial knee joint: Secondary | ICD-10-CM | POA: Diagnosis present

## 2020-04-13 LAB — COMPREHENSIVE METABOLIC PANEL
ALT: 13 U/L (ref 0–44)
AST: 22 U/L (ref 15–41)
Albumin: 4.4 g/dL (ref 3.5–5.0)
Alkaline Phosphatase: 47 U/L (ref 38–126)
Anion gap: 12 (ref 5–15)
BUN: 8 mg/dL (ref 8–23)
CO2: 24 mmol/L (ref 22–32)
Calcium: 9.8 mg/dL (ref 8.9–10.3)
Chloride: 101 mmol/L (ref 98–111)
Creatinine, Ser: 0.77 mg/dL (ref 0.44–1.00)
GFR calc Af Amer: >60 mL/min (ref >60)
GFR calc non Af Amer: >60 mL/min (ref >60)
Glucose, Bld: 111 mg/dL — ABNORMAL HIGH (ref 70–99)
Potassium: 3.4 mmol/L — ABNORMAL LOW (ref 3.5–5.1)
Sodium: 137 mmol/L (ref 135–145)
Total Bilirubin: 1 mg/dL (ref 0.3–1.2)
Total Protein: 7.9 g/dL (ref 6.5–8.1)

## 2020-04-13 LAB — URINALYSIS, COMPLETE (UACMP) WITH MICROSCOPIC
Bacteria, UA: NONE SEEN
Bilirubin Urine: NEGATIVE
Glucose, UA: NEGATIVE mg/dL
Ketones, ur: 5 mg/dL — AB
Leukocytes,Ua: NEGATIVE
Nitrite: NEGATIVE
Protein, ur: 30 mg/dL — AB
Specific Gravity, Urine: 1.029 (ref 1.005–1.030)
pH: 7 (ref 5.0–8.0)

## 2020-04-13 LAB — CBC
HCT: 41.3 % (ref 36.0–46.0)
Hemoglobin: 14.5 g/dL (ref 12.0–15.0)
MCH: 31.7 pg (ref 26.0–34.0)
MCHC: 35.1 g/dL (ref 30.0–36.0)
MCV: 90.4 fL (ref 80.0–100.0)
Platelets: 262 10*3/uL (ref 150–400)
RBC: 4.57 MIL/uL (ref 3.87–5.11)
RDW: 13.5 % (ref 11.5–15.5)
WBC: 8.5 10*3/uL (ref 4.0–10.5)
nRBC: 0 % (ref 0.0–0.2)

## 2020-04-13 LAB — SARS CORONAVIRUS 2 BY RT PCR (HOSPITAL ORDER, PERFORMED IN ~~LOC~~ HOSPITAL LAB): SARS Coronavirus 2: NEGATIVE

## 2020-04-13 LAB — LIPASE, BLOOD: Lipase: 31 U/L (ref 11–51)

## 2020-04-13 MED ORDER — METOCLOPRAMIDE HCL 5 MG/ML IJ SOLN
5.0000 mg | Freq: Four times a day (QID) | INTRAMUSCULAR | Status: DC
  Administered 2020-04-14 – 2020-04-15 (×6): 5 mg via INTRAVENOUS
  Filled 2020-04-13 (×6): qty 2

## 2020-04-13 MED ORDER — CYANOCOBALAMIN 1000 MCG/ML IJ SOLN: 1000.0000 ug | INTRAMUSCULAR | Status: DC

## 2020-04-13 MED ORDER — IBUPROFEN 400 MG PO TABS
400.0000 mg | ORAL_TABLET | Freq: Once | ORAL | Status: AC | PRN
  Administered 2020-04-13: 400 mg via ORAL
  Filled 2020-04-13: qty 1

## 2020-04-13 MED ORDER — ACETAMINOPHEN 325 MG PO TABS: 650.0000 mg | ORAL_TABLET | Freq: Four times a day (QID) | ORAL | Status: DC | PRN

## 2020-04-13 MED ORDER — ONDANSETRON HCL 4 MG PO TABS: 4.0000 mg | ORAL_TABLET | Freq: Once | ORAL | Status: DC

## 2020-04-13 MED ORDER — ISOSORBIDE MONONITRATE ER 30 MG PO TB24
30.0000 mg | ORAL_TABLET | Freq: Every day | ORAL | Status: DC
  Administered 2020-04-14 – 2020-04-15 (×2): 30 mg via ORAL
  Filled 2020-04-13 (×2): qty 1

## 2020-04-13 MED ORDER — SODIUM CHLORIDE 0.9 % IV BOLUS
500.0000 mL | Freq: Once | INTRAVENOUS | Status: AC
  Administered 2020-04-13: 500 mL via INTRAVENOUS

## 2020-04-13 MED ORDER — HYDRALAZINE HCL 20 MG/ML IJ SOLN: 10.0000 mg | Freq: Four times a day (QID) | INTRAMUSCULAR | Status: DC | PRN

## 2020-04-13 MED ORDER — LEVOTHYROXINE SODIUM 50 MCG PO TABS
50.0000 ug | ORAL_TABLET | Freq: Every day | ORAL | Status: DC
  Administered 2020-04-14 – 2020-04-15 (×2): 50 ug via ORAL
  Filled 2020-04-13 (×2): qty 1

## 2020-04-13 MED ORDER — POLYVINYL ALCOHOL 1.4 % OP SOLN
1.0000 [drp] | Freq: Two times a day (BID) | OPHTHALMIC | Status: DC
  Administered 2020-04-14: 1 [drp] via OPHTHALMIC
  Filled 2020-04-13: qty 15

## 2020-04-13 MED ORDER — ONDANSETRON HCL 4 MG PO TABS: 4.0000 mg | ORAL_TABLET | Freq: Four times a day (QID) | ORAL | Status: DC | PRN

## 2020-04-13 MED ORDER — POTASSIUM CHLORIDE IN NACL 20-0.9 MEQ/L-% IV SOLN
INTRAVENOUS | Status: DC
  Filled 2020-04-13 (×4): qty 1000

## 2020-04-13 MED ORDER — CARVEDILOL 6.25 MG PO TABS: 12.5000 mg | ORAL_TABLET | Freq: Two times a day (BID) | ORAL | Status: DC

## 2020-04-13 MED ORDER — APIXABAN 2.5 MG PO TABS
2.5000 mg | ORAL_TABLET | Freq: Two times a day (BID) | ORAL | Status: DC
  Administered 2020-04-14 – 2020-04-15 (×3): 2.5 mg via ORAL
  Filled 2020-04-13 (×5): qty 1

## 2020-04-13 MED ORDER — VITAMIN D 25 MCG (1000 UNIT) PO TABS: 1000.0000 [IU] | ORAL_TABLET | Freq: Every day | ORAL | Status: DC

## 2020-04-13 MED ORDER — IOHEXOL 300 MG/ML  SOLN
100.0000 mL | Freq: Once | INTRAMUSCULAR | Status: AC | PRN
  Administered 2020-04-13: 100 mL via INTRAVENOUS

## 2020-04-13 MED ORDER — MAGNESIUM HYDROXIDE 400 MG/5ML PO SUSP
30.0000 mL | Freq: Every day | ORAL | Status: DC | PRN
  Filled 2020-04-13: qty 30

## 2020-04-13 MED ORDER — POTASSIUM CHLORIDE CRYS ER 20 MEQ PO TBCR: 20.0000 meq | EXTENDED_RELEASE_TABLET | Freq: Every morning | ORAL | Status: DC

## 2020-04-13 MED ORDER — FERROUS FUMARATE 324 (106 FE) MG PO TABS
1.0000 | ORAL_TABLET | Freq: Every day | ORAL | Status: DC
  Administered 2020-04-14: 106 mg via ORAL
  Filled 2020-04-13 (×3): qty 1

## 2020-04-13 MED ORDER — ONDANSETRON HCL 4 MG/2ML IJ SOLN: 4.0000 mg | Freq: Four times a day (QID) | INTRAMUSCULAR | Status: DC | PRN

## 2020-04-13 MED ORDER — ACETAMINOPHEN 650 MG RE SUPP: 650.0000 mg | Freq: Four times a day (QID) | RECTAL | Status: DC | PRN

## 2020-04-13 MED ORDER — CARVEDILOL 3.125 MG PO TABS
12.5000 mg | ORAL_TABLET | Freq: Two times a day (BID) | ORAL | Status: DC
  Administered 2020-04-13: 12.5 mg via ORAL
  Filled 2020-04-13: qty 4
  Filled 2020-04-13 (×2): qty 2
  Filled 2020-04-13: qty 4

## 2020-04-13 MED ORDER — ONDANSETRON HCL 4 MG PO TABS: 8.0000 mg | ORAL_TABLET | Freq: Three times a day (TID) | ORAL | Status: DC | PRN

## 2020-04-13 MED ORDER — LORAZEPAM 2 MG PO TABS
2.0000 mg | ORAL_TABLET | Freq: Three times a day (TID) | ORAL | Status: DC | PRN
  Administered 2020-04-14: 2 mg via ORAL
  Filled 2020-04-13: qty 1

## 2020-04-13 MED ORDER — SERTRALINE HCL 50 MG PO TABS
100.0000 mg | ORAL_TABLET | Freq: Every day | ORAL | Status: DC
  Filled 2020-04-13: qty 2

## 2020-04-13 MED ORDER — ENOXAPARIN SODIUM 40 MG/0.4ML ~~LOC~~ SOLN: 40.0000 mg | SUBCUTANEOUS | Status: DC

## 2020-04-13 MED ORDER — HYDROCODONE-ACETAMINOPHEN 5-325 MG PO TABS: 1.0000 | ORAL_TABLET | Freq: Four times a day (QID) | ORAL | Status: DC | PRN

## 2020-04-13 MED ORDER — AMLODIPINE BESYLATE 10 MG PO TABS
10.0000 mg | ORAL_TABLET | Freq: Every day | ORAL | Status: DC
  Administered 2020-04-14: 10 mg via ORAL
  Filled 2020-04-13: qty 2
  Filled 2020-04-13: qty 1
  Filled 2020-04-13: qty 2

## 2020-04-13 MED ORDER — LABETALOL HCL 5 MG/ML IV SOLN
5.0000 mg | Freq: Once | INTRAVENOUS | Status: AC
  Administered 2020-04-13: 5 mg via INTRAVENOUS
  Filled 2020-04-13: qty 4

## 2020-04-13 MED ORDER — POTASSIUM CHLORIDE 20 MEQ PO PACK: 40.0000 meq | PACK | Freq: Once | ORAL | Status: DC

## 2020-04-13 MED ORDER — LORAZEPAM 1 MG PO TABS
1.0000 mg | ORAL_TABLET | Freq: Once | ORAL | Status: AC
  Administered 2020-04-13: 1 mg via ORAL
  Filled 2020-04-13: qty 1

## 2020-04-13 MED ORDER — TRAZODONE HCL 50 MG PO TABS
25.0000 mg | ORAL_TABLET | Freq: Every evening | ORAL | Status: DC | PRN
  Administered 2020-04-15: 01:00:00 25 mg via ORAL
  Filled 2020-04-13: qty 1

## 2020-04-13 MED ORDER — LABETALOL HCL 5 MG/ML IV SOLN: 10.0000 mg | Freq: Once | INTRAVENOUS | Status: DC

## 2020-04-13 MED ORDER — ONDANSETRON 4 MG PO TBDP
4.0000 mg | ORAL_TABLET | Freq: Once | ORAL | Status: AC
  Administered 2020-04-13: 4 mg via ORAL

## 2020-04-13 MED ORDER — MAGNESIUM OXIDE 400 MG PO TABS: 400.0000 mg | ORAL_TABLET | Freq: Every day | ORAL | Status: DC

## 2020-04-13 MED ORDER — ONDANSETRON 4 MG PO TBDP
ORAL_TABLET | ORAL | Status: AC
  Filled 2020-04-13: qty 1

## 2020-04-13 MED ORDER — DOCUSATE SODIUM 100 MG PO CAPS
100.0000 mg | ORAL_CAPSULE | Freq: Two times a day (BID) | ORAL | Status: DC
  Administered 2020-04-14 (×3): 100 mg via ORAL
  Filled 2020-04-13 (×4): qty 1

## 2020-04-13 MED ORDER — ROSUVASTATIN CALCIUM 20 MG PO TABS
40.0000 mg | ORAL_TABLET | Freq: Every day | ORAL | Status: DC
  Administered 2020-04-14 – 2020-04-15 (×2): 40 mg via ORAL
  Filled 2020-04-13 (×3): qty 2

## 2020-04-13 MED ORDER — ENSURE ENLIVE PO LIQD
237.0000 mL | Freq: Two times a day (BID) | ORAL | Status: DC
  Administered 2020-04-14 – 2020-04-15 (×2): 237 mL via ORAL

## 2020-04-13 MED ORDER — SIMETHICONE 80 MG PO CHEW: 80.0000 mg | CHEWABLE_TABLET | Freq: Four times a day (QID) | ORAL | Status: DC | PRN

## 2020-04-13 NOTE — ED Notes (Signed)
Pt transported to CT ?

## 2020-04-13 NOTE — ED Triage Notes (Signed)
Pt comes EMS from home with dry heaving and headache for several days. Pt was seen at PCP and treated for anxiety. Pt hasn't been able to eat well due to the vomiting.
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
## 2020-04-13 NOTE — ED Notes (Signed)
Offered protocol ibuprofen, pt denies.

## 2020-04-13 NOTE — H&P (Addendum)
[facility] NAME: Erin Sutton    MR#:  [id number]  DATE OF BIRTH:  17-Mar-1941  DATE OF ADMISSION:  04/13/2020  PRIMARY CARE PHYSICIAN: Baxter Hire, MD   REQUESTING/REFERRING PHYSICIAN: Merlyn Lot, MD  CHIEF COMPLAINT:   Chief Complaint  Patient presents with  . Emesis  . Headache    HISTORY OF PRESENT ILLNESS:  Erin Sutton  is a 79 y.o. Caucasian female with a known history of hypertension, hypothyroidism, GERD, obstructive sleep apnea and chronic kidney disease, who presented to the emergency room with acute onset of intractable nausea and vomiting  for the last couple days with associated epigastric pain today as well as headache.  She has not been able to keep her blood pressure medications for the last few days.  She denies any bilious vomitus or hematemesis.  No diarrhea or melena or bright red bleeding per rectum.  No dizziness or blurred vision or focal muscle weakness. She admitted to occasional left hand tingling.  Upon presentation to the emergency room, blood pressure was 185/82 and later 223/107 with otherwise normal vital signs.  Labs revealed hypokalemia of 3.4 and unremarkable CMP and CBC otherwise. EKG showed normal sinus rhythm with a rate of 97 with occasional PVCs, low voltage QRS and right bundle branch block as well as left axis deviation.  Noncontrasted head CT scan was unremarkable.  Abdominal and pelvic CT scan revealed rectosigmoid diverticulosis without acute inflammation and fibroid uterus as well as aortic atherosclerosis.  There was otherwise no acute abdominopelvic abnormality.  It showed 1.4 cm right-sided breast nodule with recommendation for outpatient mammography.  The patient was given 12.5 mg of p.o. Coreg, 5 mg of IV labetalol, 400 mg of p.o. Advil, 1 mg IV Ativan, 4 mg of IV Zofran and 500 female IV normal saline bolus.  Blood pressure has improved however she continued to have persistent nausea.  She  will be admitted to an observation medical monitor bed for further evaluation and management.   PAST MEDICAL HISTORY:   Past Medical History:  Diagnosis Date  . Anxiety   . Arthritis   . Chronic kidney disease   . Constipation   . GERD (gastroesophageal reflux disease)   . Hypertension   . Hypothyroidism   . Osteoporosis   . Sleep apnea   . Urinary incontinence     PAST SURGICAL HISTORY:   Past Surgical History:  Procedure Laterality Date  . BREAST BIOPSY Right    neg/stereo  . BREAST BIOPSY Left    neg/stereo  . BREAST EXCISIONAL BIOPSY Right    neg  . CATARACT EXTRACTION, BILATERAL    . COLONOSCOPY WITH PROPOFOL N/A 07/02/2015   Procedure: COLONOSCOPY WITH PROPOFOL;  Surgeon: Manya Silvas, MD;  Location: [facility] ENDOSCOPY;  Service: Endoscopy;  Laterality: N/A;  . CORRECTION HAMMER TOE Right   . ESOPHAGOGASTRODUODENOSCOPY (EGD) WITH PROPOFOL N/A 07/02/2015   Procedure: ESOPHAGOGASTRODUODENOSCOPY (EGD) WITH PROPOFOL;  Surgeon: Manya Silvas, MD;  Location: [facility] ENDOSCOPY;  Service: Endoscopy;  Laterality: N/A;  . JOINT REPLACEMENT Left    knee  . REPLACEMENT TOTAL KNEE Left   . SKIN BIOPSY Right     SOCIAL HISTORY:   Social History   Tobacco Use  . Smoking status: Never Smoker  . Smokeless tobacco: Never Used  Substance Use Topics  . Alcohol use: No    FAMILY HISTORY:   Family History  Problem Relation Age of Onset  . Hypertension Mother   .  Diabetes Mother   . Heart attack Mother   . Asthma Mother   . Peripheral vascular disease Mother   . Heart attack Father   . Hypertension Father   . Diabetes Father   . Colon cancer Maternal Aunt     DRUG ALLERGIES:   Allergies  Allergen Reactions  . Ace Inhibitors Cough  . Levofloxacin   . Penicillin G Other (See Comments)  . Tape Other (See Comments)    Other reaction(s): Other (See Comments)  . Amoxicillin Rash    Has patient had a PCN reaction causing immediate rash, facial/tongue/throat swelling,  SOB or lightheadedness with hypotension: No Has patient had a PCN reaction causing severe rash involving mucus membranes or skin necrosis: No Has patient had a PCN reaction that required hospitalization: No Has patient had a PCN reaction occurring within the last 10 years: No If all of the above answers are "NO", then may proceed with Cephalosporin use.   Marland Kitchen Benicar [Olmesartan] Rash    Itching rash  . Codeine Rash  . Codeine Sulfate Rash  . Levaquin [Levofloxacin In D5w] Rash    Alters mental status  . Penicillin V Potassium Rash    Has patient had a PCN reaction causing immediate rash, facial/tongue/throat swelling, SOB or lightheadedness with hypotension: No Has patient had a PCN reaction causing severe rash involving mucus membranes or skin necrosis: No Has patient had a PCN reaction that required hospitalization: No Has patient had a PCN reaction occurring within the last 10 years: No If all of the above answers are "NO", then may proceed with Cephalosporin use.   . Sulfa Antibiotics Rash    REVIEW OF SYSTEMS:   ROS As per history of present illness. All pertinent systems were reviewed above. Constitutional,  HEENT, cardiovascular, respiratory, GI, GU, musculoskeletal, neuro, psychiatric, endocrine,  integumentary and hematologic systems were reviewed and are otherwise  negative/unremarkable except for positive findings mentioned above in the HPI.   MEDICATIONS AT HOME:   Prior to Admission medications   Medication Sig Start Date End Date Taking? Authorizing Provider  amLODipine (NORVASC) 10 MG tablet Take 10 mg by mouth daily.    [provider]  apixaban (ELIQUIS) 2.5 MG TABS tablet Take 1 tablet (2.5 mg total) by mouth 2 (two) times daily. 02/02/20   Earlie Server, MD  carvedilol (COREG) 12.5 MG tablet Take 12.5 mg by mouth 2 (two) times daily.    [provider]  cholecalciferol (VITAMIN D3) 25 MCG (1000 UT) tablet Take 1,000 Units by mouth daily.     [provider]  cyanocobalamin (,VITAMIN B-12,) 1000 MCG/ML injection Inject 1,000 mcg into the muscle every 30 (thirty) days.    [provider]  docusate sodium (COLACE) 100 MG capsule Take 1 capsule (100 mg total) by mouth 2 (two) times daily. 07/05/19 07/04/20  Saundra Shelling, MD  feeding supplement, ENSURE ENLIVE, (ENSURE ENLIVE) LIQD Take 237 mLs by mouth 2 (two) times daily between meals. Patient not taking: Reported on 12/13/2019 07/05/19   Saundra Shelling, MD  ferrous fumarate (HEMOCYTE - 106 MG FE) 325 (106 Fe) MG TABS tablet  07/29/19   [provider]  HYDROcodone-acetaminophen (NORCO/VICODIN) 5-325 MG tablet Take 1 tablet by mouth every 6 (six) hours as needed for moderate pain. 02/09/20   Fisher, Linden Dolin, PA-C  isosorbide mononitrate (IMDUR) 30 MG 24 hr tablet Take 30 mg by mouth daily.    [provider]  levothyroxine (SYNTHROID, LEVOTHROID) 50 MCG tablet Take 50 mcg  by mouth daily before breakfast.    [provider]  LORazepam (ATIVAN) 2 MG tablet Take 2 mg by mouth every 8 (eight) hours as needed for anxiety.     [provider]  magnesium oxide (MAG-OX) 400 MG tablet Take 400 mg by mouth daily.    [provider]  ondansetron (ZOFRAN) 8 MG tablet Take 8 mg by mouth every 8 (eight) hours as needed. 06/06/19   [provider]  ondansetron (ZOFRAN-ODT) 4 MG disintegrating tablet  06/29/19   [provider]  pantoprazole (PROTONIX) 40 MG tablet Take 40 mg by mouth daily.    [provider]  Polyethyl Glycol-Propyl Glycol (SYSTANE OP) Place 1 drop into both eyes 2 (two) times daily.    [provider]  potassium chloride SA (K-DUR,KLOR-CON) 20 MEQ tablet Take 20 mEq by mouth every morning.    [provider]  rosuvastatin (CRESTOR) 40 MG tablet Take 40 mg by mouth daily. 11/14/19   [provider]  sertraline (ZOLOFT) 100 MG tablet Take 100 mg by mouth at bedtime.     [provider]  simethicone (MYLICON) 80 MG chewable tablet Chew 1 tablet (80 mg total) by mouth every 6 (six) hours as needed for flatulence. 07/06/19   Saundra Shelling, MD      VITAL SIGNS:  Blood pressure 118/79, pulse 65, temperature 98.7 F (37.1 C), temperature source Oral, resp. rate 12, height 5\' 5"  (1.651 m), weight 72.6 kg, SpO2 99 %.  PHYSICAL EXAMINATION:  Physical Exam  GENERAL:  79 y.o.-year-old Caucasian female patient lying in the bed with no acute distress.  EYES: Pupils equal, round, reactive to light and accommodation. No scleral icterus. Extraocular muscles intact.  HEENT: Head atraumatic, normocephalic. Oropharynx and nasopharynx clear.  NECK:  Supple, no jugular venous distention. No thyroid enlargement, no tenderness.  LUNGS: Normal breath sounds bilaterally, no wheezing, rales,rhonchi or crepitation. No use of accessory muscles of respiration.  CARDIOVASCULAR: Regular rate and rhythm, S1, S2 normal. No murmurs, rubs, or gallops.  ABDOMEN: Soft, nondistended, with mild epigastric tenderness without rebound tenderness guarding or rigidity.  Bowel sounds present. No organomegaly or mass.  EXTREMITIES: No pedal edema, cyanosis, or clubbing.  NEUROLOGIC: Cranial nerves II through XII are intact. Muscle strength 5/5 in all extremities. Sensation intact. Gait not checked.  PSYCHIATRIC: The patient is alert and oriented x 3.  Normal affect and good eye contact. SKIN: No obvious rash, lesion, or ulcer.   LABORATORY PANEL:   CBC Recent Labs  Lab 04/13/20 1739  WBC 8.5  HGB 14.5  HCT 41.3  PLT 262   ------------------------------------------------------------------------------------------------------------------  Chemistries  Recent Labs  Lab 04/13/20 1739  NA 137  K 3.4*  CL 101  CO2 24  GLUCOSE 111*  BUN 8  CREATININE 0.77  CALCIUM 9.8  AST 22  ALT 13  ALKPHOS 47  BILITOT 1.0    ------------------------------------------------------------------------------------------------------------------  Cardiac Enzymes No results for input(s): TROPONINI in the last 168 hours. ------------------------------------------------------------------------------------------------------------------  RADIOLOGY:  CT Head Wo Contrast  Result Date: 04/13/2020 CLINICAL DATA:  Headache, anxiety, dry heaves EXAM: CT HEAD WITHOUT CONTRAST TECHNIQUE: Contiguous axial images were obtained from the base of the skull through the vertex without intravenous contrast. COMPARISON:  07/02/2019 FINDINGS: Brain: No acute infarct or hemorrhage. Lateral ventricles and midline structures are unremarkable. No acute extra-axial fluid collections. No mass effect. Vascular: No hyperdense vessel or unexpected calcification. Skull: Normal. Negative for fracture or focal lesion. Sinuses/Orbits: No acute finding. Other:  None. IMPRESSION: 1. Stable head CT, no acute process. Electronically Signed   By: Randa Ngo M.D.   On: 04/13/2020 21:19   CT ABDOMEN PELVIS W CONTRAST  Result Date: 04/13/2020 CLINICAL DATA:  Nausea and vomiting. EXAM: CT ABDOMEN AND PELVIS WITH CONTRAST TECHNIQUE: Multidetector CT imaging of the abdomen and pelvis was performed using the standard protocol following bolus administration of intravenous contrast. CONTRAST:  197mL OMNIPAQUE IOHEXOL 300 MG/ML  SOLN COMPARISON:  CT dated 07/02/2019 FINDINGS: Lower chest: The lung bases are clear. The heart size is normal. There appears to be a right-sided breast nodule measuring approximately 1.4 cm (previously measuring approximately 1.2 cm). Hepatobiliary: The liver is normal. Normal gallbladder.There is no biliary ductal dilation. Pancreas: Normal contours without ductal dilatation. No peripancreatic fluid collection. Spleen: Unremarkable. Adrenals/Urinary Tract: --Adrenal glands: Unremarkable. --Right kidney/ureter: No hydronephrosis or radiopaque  kidney stones. --Left kidney/ureter: No hydronephrosis or radiopaque kidney stones. --Urinary bladder: Unremarkable. Stomach/Bowel: --Stomach/Duodenum: No hiatal hernia or other gastric abnormality. Normal duodenal course and caliber. --Small bowel: Unremarkable. --Colon: Rectosigmoid diverticulosis without acute inflammation. --Appendix: Normal. Vascular/Lymphatic: Atherosclerotic calcification is present within the non-aneurysmal abdominal aorta, without hemodynamically significant stenosis. --No retroperitoneal lymphadenopathy. --No mesenteric lymphadenopathy. --No pelvic or inguinal lymphadenopathy. Reproductive: There is a fibroid uterus. Other: No ascites or free air. The abdominal wall is normal. Musculoskeletal. No acute displaced fractures. IMPRESSION: 1. No acute abdominopelvic abnormality. 2. Rectosigmoid diverticulosis without acute inflammation. 3. Apparent 1.4 cm right-sided breast nodule. Outpatient mammography is recommended for further evaluation of this finding. 4. Fibroid uterus. Aortic Atherosclerosis (ICD10-I70.0). Electronically Signed   By: Constance Holster M.D.   On: 04/13/2020 21:23      IMPRESSION AND PLAN:   1.  Intractable nausea and vomiting with epigastric abdominal pain. -The patient will be admitted to an observation observation medically monitored bed. -She will be hydrated with IV normal saline. -We will place her on scheduled IV Reglan and as needed Zofran.  2.  Hypertensive urgency with likely subsequent headache. -This is not secondary to intractable nausea vomiting and inability to keep her antihypertensives. -She will be placed on as needed IV hydralazine. -We will continue her antihypertensives. -The patient had negative noncontrasted head CT scan.  3.  Hypokalemia. -We will replace potassium and check magnesium level.  4.  Right breast nodule. -General surgery consultation will be obtained. -I notified Dr. Dahlia Byes about the patient for further  assessment and future follow-up.  6.  Depression. -We will continue Zoloft.  7.  Dyslipidemia. -We will continue statin therapy.  8.  GERD. -The patient will be on IV PPI therapy.  9.  DVT prophylaxis. -We will continue Eliquis.   All the records are reviewed and case discussed with ED provider. The plan of care was discussed in details with the patient (and family). I answered all questions. The patient agreed to proceed with the above mentioned plan. Further management will depend upon hospital course.   CODE STATUS: Full code  Status is: Observation  The patient remains OBS appropriate and will d/c before 2 midnights.  Dispo: The patient is from: Home              Anticipated d/c is to: Home              Anticipated d/c date is: 1 day              Patient currently is not medically stable to d/c.   TOTAL TIME TAKING CARE OF THIS PATIENT: 55 minutes.  Christel Mormon M.D on 04/13/2020 at 11:09 PM  [facility]   From 7 PM-7 AM, contact night-coverage [email or web address]  CC: Primary care physician; Baxter Hire, MD   Note: This dictation was prepared with Dragon dictation along with smaller phrase technology. Any transcriptional errors that result from this process are unintentional.

## 2020-04-13 NOTE — ED Notes (Signed)
Pt states having a bad headache along with nausea and vomiting. Denies fevers, shob, and cp but does have slight chest pressure.

## 2020-04-14 ENCOUNTER — Encounter: Payer: Self-pay | Admitting: Internal Medicine

## 2020-04-14 DIAGNOSIS — M810 Age-related osteoporosis without current pathological fracture: Secondary | ICD-10-CM | POA: Diagnosis present

## 2020-04-14 DIAGNOSIS — Z86718 Personal history of other venous thrombosis and embolism: Secondary | ICD-10-CM | POA: Diagnosis not present

## 2020-04-14 DIAGNOSIS — R112 Nausea with vomiting, unspecified: Secondary | ICD-10-CM | POA: Diagnosis present

## 2020-04-14 DIAGNOSIS — F329 Major depressive disorder, single episode, unspecified: Secondary | ICD-10-CM | POA: Diagnosis present

## 2020-04-14 DIAGNOSIS — Z79890 Hormone replacement therapy (postmenopausal): Secondary | ICD-10-CM | POA: Diagnosis not present

## 2020-04-14 DIAGNOSIS — A084 Viral intestinal infection, unspecified: Secondary | ICD-10-CM | POA: Diagnosis present

## 2020-04-14 DIAGNOSIS — Z96652 Presence of left artificial knee joint: Secondary | ICD-10-CM | POA: Diagnosis present

## 2020-04-14 DIAGNOSIS — I161 Hypertensive emergency: Secondary | ICD-10-CM | POA: Diagnosis present

## 2020-04-14 DIAGNOSIS — Z20822 Contact with and (suspected) exposure to covid-19: Secondary | ICD-10-CM | POA: Diagnosis present

## 2020-04-14 DIAGNOSIS — Z79899 Other long term (current) drug therapy: Secondary | ICD-10-CM | POA: Diagnosis not present

## 2020-04-14 DIAGNOSIS — K529 Noninfective gastroenteritis and colitis, unspecified: Secondary | ICD-10-CM | POA: Diagnosis present

## 2020-04-14 DIAGNOSIS — Z7901 Long term (current) use of anticoagulants: Secondary | ICD-10-CM | POA: Diagnosis not present

## 2020-04-14 DIAGNOSIS — F419 Anxiety disorder, unspecified: Secondary | ICD-10-CM | POA: Diagnosis present

## 2020-04-14 DIAGNOSIS — N6310 Unspecified lump in the right breast, unspecified quadrant: Secondary | ICD-10-CM | POA: Diagnosis present

## 2020-04-14 DIAGNOSIS — K219 Gastro-esophageal reflux disease without esophagitis: Secondary | ICD-10-CM | POA: Diagnosis present

## 2020-04-14 DIAGNOSIS — E039 Hypothyroidism, unspecified: Secondary | ICD-10-CM | POA: Diagnosis present

## 2020-04-14 DIAGNOSIS — I10 Essential (primary) hypertension: Secondary | ICD-10-CM | POA: Diagnosis present

## 2020-04-14 DIAGNOSIS — E876 Hypokalemia: Secondary | ICD-10-CM | POA: Diagnosis present

## 2020-04-14 DIAGNOSIS — E785 Hyperlipidemia, unspecified: Secondary | ICD-10-CM | POA: Diagnosis present

## 2020-04-14 LAB — BASIC METABOLIC PANEL
Anion gap: 7 (ref 5–15)
BUN: 9 mg/dL (ref 8–23)
CO2: 27 mmol/L (ref 22–32)
Calcium: 8.9 mg/dL (ref 8.9–10.3)
Chloride: 102 mmol/L (ref 98–111)
Creatinine, Ser: 0.8 mg/dL (ref 0.44–1.00)
GFR calc Af Amer: >60 mL/min (ref >60)
GFR calc non Af Amer: >60 mL/min (ref >60)
Glucose, Bld: 107 mg/dL — ABNORMAL HIGH (ref 70–99)
Potassium: 3.5 mmol/L (ref 3.5–5.1)
Sodium: 136 mmol/L (ref 135–145)

## 2020-04-14 LAB — MAGNESIUM: Magnesium: 1.9 mg/dL (ref 1.7–2.4)

## 2020-04-14 LAB — CBC
HCT: 34.8 % — ABNORMAL LOW (ref 36.0–46.0)
Hemoglobin: 11.6 g/dL — ABNORMAL LOW (ref 12.0–15.0)
MCH: 31.6 pg (ref 26.0–34.0)
MCHC: 33.3 g/dL (ref 30.0–36.0)
MCV: 94.8 fL (ref 80.0–100.0)
Platelets: 244 10*3/uL (ref 150–400)
RBC: 3.67 MIL/uL — ABNORMAL LOW (ref 3.87–5.11)
RDW: 13.7 % (ref 11.5–15.5)
WBC: 7.2 10*3/uL (ref 4.0–10.5)
nRBC: 0 % (ref 0.0–0.2)

## 2020-04-14 MED ORDER — POLYVINYL ALCOHOL 1.4 % OP SOLN
1.0000 [drp] | OPHTHALMIC | Status: DC | PRN
  Administered 2020-04-14: 19:00:00 1 [drp] via OPHTHALMIC
  Filled 2020-04-14: qty 15

## 2020-04-14 MED ORDER — ESCITALOPRAM OXALATE 10 MG PO TABS
10.0000 mg | ORAL_TABLET | Freq: Every day | ORAL | Status: DC
  Administered 2020-04-14: 10 mg via ORAL
  Filled 2020-04-14 (×3): qty 1

## 2020-04-14 MED ORDER — ARTIFICIAL TEARS OPHTHALMIC OINT
TOPICAL_OINTMENT | OPHTHALMIC | Status: DC | PRN
  Filled 2020-04-14: qty 3.5

## 2020-04-14 NOTE — Progress Notes (Signed)
PROGRESS NOTE    EVIA GOLDSMITH  IWL:[id number] DOB: 1941/09/29 DOA: 04/13/2020 PCP: Baxter Hire, MD   Brief Narrative:  Arriana Lohmann  is a 79 y.o. Caucasian female with a known history of hypertension, hypothyroidism, GERD, obstructive sleep apnea and chronic kidney disease, who presented to the emergency room with acute onset of intractable nausea and vomiting  for the last couple days with associated epigastric pain today as well as headache.  She normally is constipated, she has loose stool yesterday.   Her condition is consistent with gastroenteritis.  She was giving fluids and symptomatic treatment in the emergency room.   Assessment & Plan:   Active Problems:   Intractable nausea and vomiting  #1. intractable nausea vomiting most likely due to viral gastroenteritis. She still has significant nausea vomiting today.  We will continue fluids.  Symptomatic treatment.  She did not have additional diarrhea since the first episode.  2.  Hypertension emergency. Resume home medicines.  3.  Hypokalemia. Supplemented.  Recheck BMP and magnesium level tomorrow.  4.  Right breast nodule.  Follow-up as outpatient.   DVT prophylaxis: On eliquis Code Status: Full Family Communication: None in room. Disposition Plan:  . Patient came from:            . Anticipated d/c place: . Barriers to d/c OR conditions which need to be met to effect a safe d/c:   Consultants:   None  Procedures:None Antimicrobials: None  Subjective: Patient still has significant nausea vomiting.  Abdominal pain has resolved.  She had one episode of loose stool earlier this morning. No fever or chills.  No shortness of breath.  Objective: Vitals:   04/14/20 0930 04/14/20 0932 04/14/20 1000 04/14/20 1030  BP: 123/75 123/75 127/62 134/71  Pulse: (!) 54 (!) 52 (!) 49 61  Resp: 12  13 10   Temp:      TempSrc:      SpO2: 92%  91% 94%  Weight:      Height:       No intake or output data in the 24  hours ending 04/14/20 1146 Filed Weights   04/13/20 1737  Weight: 72.6 kg    Examination:  General exam: Appears calm and comfortable  Respiratory system: Clear to auscultation. Respiratory effort normal. Cardiovascular system: S1 & S2 heard, RRR. No JVD, murmurs, rubs, gallops or clicks. No pedal edema. Gastrointestinal system: Abdomen is nondistended, soft and mild upper gastric tender. No organomegaly or masses felt. Normal bowel sounds heard. Central nervous system: Alert and oriented. No focal neurological deficits. Extremities: Symmetric 5 x 5 power. Skin: No rashes, lesions or ulcers Psychiatry: Judgement and insight appear normal. Mood & affect appropriate.     Data Reviewed: I have personally reviewed following labs and imaging studies  CBC: Recent Labs  Lab 04/13/20 1739 04/14/20 0343  WBC 8.5 7.2  HGB 14.5 11.6*  HCT 41.3 34.8*  MCV 90.4 94.8  PLT 262 174   Basic Metabolic Panel: Recent Labs  Lab 04/13/20 1739 04/14/20 0343  NA 137 136  K 3.4* 3.5  CL 101 102  CO2 24 27  GLUCOSE 111* 107*  BUN 8 9  CREATININE 0.77 0.80  CALCIUM 9.8 8.9  MG  --  1.9   GFR: Estimated Creatinine Clearance: 57.8 mL/min (by C-G formula based on SCr of 0.8 mg/dL). Liver Function Tests: Recent Labs  Lab 04/13/20 1739  AST 22  ALT 13  ALKPHOS 47  BILITOT 1.0  PROT 7.9  ALBUMIN 4.4   Recent Labs  Lab 04/13/20 1739  LIPASE 31   No results for input(s): AMMONIA in the last 168 hours. Coagulation Profile: No results for input(s): INR, PROTIME in the last 168 hours. Cardiac Enzymes: No results for input(s): CKTOTAL, CKMB, CKMBINDEX, TROPONINI in the last 168 hours. BNP (last 3 results) No results for input(s): PROBNP in the last 8760 hours. HbA1C: No results for input(s): HGBA1C in the last 72 hours. CBG: No results for input(s): GLUCAP in the last 168 hours. Lipid Profile: No results for input(s): CHOL, HDL, LDLCALC, TRIG, CHOLHDL, LDLDIRECT in the last 72  hours. Thyroid Function Tests: No results for input(s): TSH, T4TOTAL, FREET4, T3FREE, THYROIDAB in the last 72 hours. Anemia Panel: No results for input(s): VITAMINB12, FOLATE, FERRITIN, TIBC, IRON, RETICCTPCT in the last 72 hours. Sepsis Labs: No results for input(s): PROCALCITON, LATICACIDVEN in the last 168 hours.  Recent Results (from the past 240 hour(s))  SARS Coronavirus 2 by RT PCR (hospital order, performed in [facility] hospital lab) Nasopharyngeal Nasopharyngeal Swab     Status: None   Collection Time: 04/13/20 10:30 PM   Specimen: Nasopharyngeal Swab  Result Value Ref Range Status   SARS Coronavirus 2 NEGATIVE NEGATIVE Final    Comment: (NOTE) SARS-CoV-2 target nucleic acids are NOT DETECTED. The SARS-CoV-2 RNA is generally detectable in upper and lower respiratory specimens during the acute phase of infection. The lowest concentration of SARS-CoV-2 viral copies this assay can detect is 250 copies / mL. A negative result does not preclude SARS-CoV-2 infection and should not be used as the sole basis for treatment or other patient management decisions.  A negative result may occur with improper specimen collection / handling, submission of specimen other than nasopharyngeal swab, presence of viral mutation(s) within the areas targeted by this assay, and inadequate number of viral copies (<250 copies / mL). A negative result must be combined with clinical observations, patient history, and epidemiological information. Fact Sheet for Patients:   StrictlyIdeas.no Fact Sheet for Healthcare Providers: BankingDealers.co.za This test is not yet approved or cleared  by the [place] FDA and has been authorized for detection and/or diagnosis of SARS-CoV-2 by FDA under an Emergency Use Authorization (EUA).  This EUA will remain in effect (meaning this test can be used) for the duration of the COVID-19 declaration under Section  564(b)(1) of the Act, 21 U.S.C. section 360bbb-3(b)(1), unless the authorization is terminated or revoked sooner. Performed at [facility], [address], [place], [place] [address]          Radiology Studies: CT Head Wo Contrast  Result Date: 04/13/2020 CLINICAL DATA:  Headache, anxiety, dry heaves EXAM: CT HEAD WITHOUT CONTRAST TECHNIQUE: Contiguous axial images were obtained from the base of the skull through the vertex without intravenous contrast. COMPARISON:  07/02/2019 FINDINGS: Brain: No acute infarct or hemorrhage. Lateral ventricles and midline structures are unremarkable. No acute extra-axial fluid collections. No mass effect. Vascular: No hyperdense vessel or unexpected calcification. Skull: Normal. Negative for fracture or focal lesion. Sinuses/Orbits: No acute finding. Other: None. IMPRESSION: 1. Stable head CT, no acute process. Electronically Signed   By: Randa Ngo M.D.   On: 04/13/2020 21:19   CT ABDOMEN PELVIS W CONTRAST  Result Date: 04/13/2020 CLINICAL DATA:  Nausea and vomiting. EXAM: CT ABDOMEN AND PELVIS WITH CONTRAST TECHNIQUE: Multidetector CT imaging of the abdomen and pelvis was performed using the standard protocol following bolus administration of intravenous contrast. CONTRAST:  143m OMNIPAQUE IOHEXOL 300  MG/ML  SOLN COMPARISON:  CT dated 07/02/2019 FINDINGS: Lower chest: The lung bases are clear. The heart size is normal. There appears to be a right-sided breast nodule measuring approximately 1.4 cm (previously measuring approximately 1.2 cm). Hepatobiliary: The liver is normal. Normal gallbladder.There is no biliary ductal dilation. Pancreas: Normal contours without ductal dilatation. No peripancreatic fluid collection. Spleen: Unremarkable. Adrenals/Urinary Tract: --Adrenal glands: Unremarkable. --Right kidney/ureter: No hydronephrosis or radiopaque kidney stones. --Left kidney/ureter: No hydronephrosis or radiopaque kidney stones. --Urinary  bladder: Unremarkable. Stomach/Bowel: --Stomach/Duodenum: No hiatal hernia or other gastric abnormality. Normal duodenal course and caliber. --Small bowel: Unremarkable. --Colon: Rectosigmoid diverticulosis without acute inflammation. --Appendix: Normal. Vascular/Lymphatic: Atherosclerotic calcification is present within the non-aneurysmal abdominal aorta, without hemodynamically significant stenosis. --No retroperitoneal lymphadenopathy. --No mesenteric lymphadenopathy. --No pelvic or inguinal lymphadenopathy. Reproductive: There is a fibroid uterus. Other: No ascites or free air. The abdominal wall is normal. Musculoskeletal. No acute displaced fractures. IMPRESSION: 1. No acute abdominopelvic abnormality. 2. Rectosigmoid diverticulosis without acute inflammation. 3. Apparent 1.4 cm right-sided breast nodule. Outpatient mammography is recommended for further evaluation of this finding. 4. Fibroid uterus. Aortic Atherosclerosis (ICD10-I70.0). Electronically Signed   By: Constance Holster M.D.   On: 04/13/2020 21:23        Scheduled Meds: . amLODipine  10 mg Oral Daily  . apixaban  2.5 mg Oral BID  . carvedilol  12.5 mg Oral BID WC  . docusate sodium  100 mg Oral BID  . escitalopram  10 mg Oral Daily  . feeding supplement (ENSURE ENLIVE)  237 mL Oral BID BM  . Ferrous Fumarate  1 tablet Oral Daily  . isosorbide mononitrate  30 mg Oral Daily  . levothyroxine  50 mcg Oral QAC breakfast  . metoCLOPramide (REGLAN) injection  5 mg Intravenous Q6H  . polyvinyl alcohol  1 drop Both Eyes BID  . rosuvastatin  40 mg Oral Daily   Continuous Infusions: . 0.9 % NaCl with KCl 20 mEq / L 100 mL/hr at 04/14/20 0300     LOS: 0 days    Time spent: 25 mins     Sharen Hones, MD [facility]   To contact the attending provider between 7A-7P or the covering provider during after hours 7P-7A, please log into the web site [email or web address] and access using universal [place] password for that web  site. If you do not have the password, please call the hospital operator.  04/14/2020, 11:46 AM

## 2020-04-15 DIAGNOSIS — K529 Noninfective gastroenteritis and colitis, unspecified: Secondary | ICD-10-CM

## 2020-04-15 LAB — BASIC METABOLIC PANEL
Anion gap: 7 (ref 5–15)
BUN: 7 mg/dL — ABNORMAL LOW (ref 8–23)
CO2: 23 mmol/L (ref 22–32)
Calcium: 9 mg/dL (ref 8.9–10.3)
Chloride: 111 mmol/L (ref 98–111)
Creatinine, Ser: 0.68 mg/dL (ref 0.44–1.00)
GFR calc Af Amer: >60 mL/min (ref >60)
GFR calc non Af Amer: >60 mL/min (ref >60)
Glucose, Bld: 82 mg/dL (ref 70–99)
Potassium: 3.7 mmol/L (ref 3.5–5.1)
Sodium: 141 mmol/L (ref 135–145)

## 2020-04-15 LAB — MAGNESIUM: Magnesium: 1.8 mg/dL (ref 1.7–2.4)

## 2020-04-15 MED ORDER — METOCLOPRAMIDE HCL 5 MG PO TABS: 5.0000 mg | ORAL_TABLET | Freq: Three times a day (TID) | ORAL | 1 refills | Status: DC

## 2020-04-15 MED ORDER — POTASSIUM CHLORIDE 20 MEQ PO PACK
40.0000 meq | PACK | Freq: Once | ORAL | Status: AC
  Administered 2020-04-15: 12:00:00 40 meq via ORAL
  Filled 2020-04-15: qty 2

## 2020-04-15 MED ORDER — SENNOSIDES-DOCUSATE SODIUM 8.6-50 MG PO TABS
2.0000 | ORAL_TABLET | Freq: Two times a day (BID) | ORAL | 0 refills | Status: DC | PRN
Start: 2020-04-15 — End: 2023-03-27

## 2020-04-15 MED ORDER — FERROUS FUMARATE 325 (106 FE) MG PO TABS: 1.0000 | ORAL_TABLET | Freq: Every day | ORAL | Status: DC

## 2020-04-15 NOTE — Discharge Instructions (Signed)
1. Soft diet for the next 3 days. 2. Zofran as needed for nausea. 3. Follow-up with PCP in 1 week. 4. Obtain mammogram for possible breast nodule on right side from PCP.

## 2020-04-15 NOTE — Discharge Summary (Signed)
Physician Discharge Summary  Patient ID: OMUNIQUE PEDERSON MRN: [id number] DOB/AGE: [birth year] 79 y.o.  Admit date: 04/13/2020 Discharge date: 04/15/2020  Admission Diagnoses:  Discharge Diagnoses:  Active Problems:   Intractable nausea and vomiting   Gastroenteritis   Discharged Condition: good  Hospital Course:  AudreyNorrisis A79 y.o.Caucasian femalewith a known history ofhypertension, hypothyroidism, GERD, obstructive sleep apnea and chronic kidney disease, who presented to the emergency room with acute onset ofintractablenausea and vomiting for the last couple days with associated epigastric pain today as well as headache.  She normally is constipated, she has loose stool yesterday.   Her condition is consistent with gastroenteritis.  She was giving fluids and symptomatic treatment in the emergency room.  Patient was treated with IV fluids as well as symptomatic treatment.  Her condition had improved today.  Nausea vomiting has resolved, she was able to tolerate a soft diet.  No diarrhea after admission.  At this point she is medically stable to be discharged.  #1. intractable nausea vomiting most likely due to viral gastroenteritis. Her condition much improved today, no nausea vomiting.  She was given IV fluids, she also received the Reglan.  I will continue oral Reglan for the next 3 days.  She will follow up with her family doctor in the near future.  2.  Hypertension emergency. Resume home medicines.  Blood pressure much better today.  3.  Hypokalemia. Resolved.  4.  Right breast nodule.  Follow-up as outpatient with PCP.  Will need to start with mammogram.   Consults: None  Significant Diagnostic Studies:  CT ABDOMEN AND PELVIS WITH CONTRAST  TECHNIQUE: Multidetector CT imaging of the abdomen and pelvis was performed using the standard protocol following bolus administration of intravenous contrast.  CONTRAST:  116mL OMNIPAQUE IOHEXOL 300 MG/ML   SOLN  COMPARISON:  CT dated 07/02/2019  FINDINGS: Lower chest: The lung bases are clear. The heart size is normal. There appears to be a right-sided breast nodule measuring approximately 1.4 cm (previously measuring approximately 1.2 cm).  Hepatobiliary: The liver is normal. Normal gallbladder.There is no biliary ductal dilation.  Pancreas: Normal contours without ductal dilatation. No peripancreatic fluid collection.  Spleen: Unremarkable.  Adrenals/Urinary Tract:  --Adrenal glands: Unremarkable.  --Right kidney/ureter: No hydronephrosis or radiopaque kidney stones.  --Left kidney/ureter: No hydronephrosis or radiopaque kidney stones.  --Urinary bladder: Unremarkable.  Stomach/Bowel:  --Stomach/Duodenum: No hiatal hernia or other gastric abnormality. Normal duodenal course and caliber.  --Small bowel: Unremarkable.  --Colon: Rectosigmoid diverticulosis without acute inflammation.  --Appendix: Normal.  Vascular/Lymphatic: Atherosclerotic calcification is present within the non-aneurysmal abdominal aorta, without hemodynamically significant stenosis.  --No retroperitoneal lymphadenopathy.  --No mesenteric lymphadenopathy.  --No pelvic or inguinal lymphadenopathy.  Reproductive: There is a fibroid uterus.  Other: No ascites or free air. The abdominal wall is normal.  Musculoskeletal. No acute displaced fractures.  IMPRESSION: 1. No acute abdominopelvic abnormality. 2. Rectosigmoid diverticulosis without acute inflammation. 3. Apparent 1.4 cm right-sided breast nodule. Outpatient mammography is recommended for further evaluation of this finding. 4. Fibroid uterus.  Aortic Atherosclerosis (ICD10-I70.0).   Electronically Signed   By: Constance Holster M.D.   On: 04/13/2020 21:23   Treatments: IV fluids and symptomatic treatment.  Discharge Exam: Blood pressure (!) 115/50, pulse 77, temperature 97.9 F (36.6 C),  temperature source Oral, resp. rate 16, height 5\' 5"  (1.651 m), weight 72.6 kg, SpO2 98 %. General appearance: alert and cooperative Resp: clear to auscultation bilaterally Cardio: regular rate and rhythm, S1, S2 normal, no murmur, click,  rub or gallop GI: soft, non-tender; bowel sounds normal; no masses,  no organomegaly Extremities: extremities normal, atraumatic, no cyanosis or edema  Disposition: Discharge disposition: 01-Home or Self Care       Discharge Instructions    Diet general   Complete by: As directed    Soft diet for 3 days.   Increase activity slowly   Complete by: As directed      Allergies as of 04/15/2020      Reactions   Ace Inhibitors Cough   Levofloxacin    Penicillin G Other (See Comments)   Tape Other (See Comments)   Other reaction(s): Other (See Comments)   Amoxicillin Rash   Has patient had a PCN reaction causing immediate rash, facial/tongue/throat swelling, SOB or lightheadedness with hypotension: No Has patient had a PCN reaction causing severe rash involving mucus membranes or skin necrosis: No Has patient had a PCN reaction that required hospitalization: No Has patient had a PCN reaction occurring within the last 10 years: No If all of the above answers are "NO", then may proceed with Cephalosporin use.   Benicar [olmesartan] Rash   Itching rash   Codeine Rash   Codeine Sulfate Rash   Levaquin [levofloxacin In D5w] Rash   Alters mental status   Penicillin V Potassium Rash   Has patient had a PCN reaction causing immediate rash, facial/tongue/throat swelling, SOB or lightheadedness with hypotension: No Has patient had a PCN reaction causing severe rash involving mucus membranes or skin necrosis: No Has patient had a PCN reaction that required hospitalization: No Has patient had a PCN reaction occurring within the last 10 years: No If all of the above answers are "NO", then may proceed with Cephalosporin use.   Sulfa Antibiotics Rash       Medication List    TAKE these medications   amLODipine 10 MG tablet Commonly known as: NORVASC Take 10 mg by mouth daily.   apixaban 2.5 MG Tabs tablet Commonly known as: Eliquis Take 1 tablet (2.5 mg total) by mouth 2 (two) times daily.   cyanocobalamin 1000 MCG/ML injection Commonly known as: (VITAMIN B-12) Inject 1,000 mcg into the muscle every 30 (thirty) days.   docusate sodium 100 MG capsule Commonly known as: Colace Take 1 capsule (100 mg total) by mouth 2 (two) times daily.   escitalopram 10 MG tablet Commonly known as: LEXAPRO Take 10 mg by mouth daily.   feeding supplement (ENSURE ENLIVE) Liqd Take 237 mLs by mouth 2 (two) times daily between meals.   ferrous fumarate 325 (106 Fe) MG Tabs tablet Commonly known as: HEMOCYTE - 106 mg FE Take 1 tablet (106 mg of iron total) by mouth daily. What changed:   how much to take  how to take this  when to take this   isosorbide mononitrate 30 MG 24 hr tablet Commonly known as: IMDUR Take 30 mg by mouth daily.   levothyroxine 50 MCG tablet Commonly known as: SYNTHROID Take 50 mcg by mouth daily before breakfast.   LORazepam 2 MG tablet Commonly known as: ATIVAN Take 2 mg by mouth every 8 (eight) hours as needed for anxiety.   metoCLOPramide 5 MG tablet Commonly known as: Reglan Take 1 tablet (5 mg total) by mouth 3 (three) times daily for 3 days.   nystatin cream Commonly known as: MYCOSTATIN Apply 1 application topically in the morning and at bedtime. (Under breast)   ondansetron 4 MG disintegrating tablet Commonly known as: ZOFRAN-ODT Take 4 mg by mouth  every 8 (eight) hours as needed.   pantoprazole 40 MG tablet Commonly known as: PROTONIX Take 40 mg by mouth daily.   polyethylene glycol 17 g packet Commonly known as: MIRALAX / GLYCOLAX Take 17 g by mouth as needed.   potassium chloride SA 20 MEQ tablet Commonly known as: KLOR-CON Take 20 mEq by mouth every morning.   rosuvastatin 40 MG  tablet Commonly known as: CRESTOR Take 40 mg by mouth daily.   senna-docusate 8.6-50 MG tablet Commonly known as: Senokot-S Take 2 tablets by mouth 2 (two) times daily as needed for mild constipation.   SYSTANE OP Place 1 drop into both eyes 2 (two) times daily.      Follow-up Information    Baxter Hire, MD Follow up in 1 week(s).   Specialty: Internal Medicine Contact information: [place] [address] [phone]           Signed: Sharen Hones 04/15/2020, 8:10 AM

## 2020-04-15 NOTE — Progress Notes (Signed)
reviewed AVS with pt, pt verbalized understanding

## 2020-04-27 MED ORDER — ONDANSETRON 4 MG PO TBDP
4.00 | ORAL_TABLET | ORAL | Status: DC
Start: ? — End: 2020-04-27

## 2020-04-27 MED ORDER — LORAZEPAM 0.5 MG PO TABS
0.50 | ORAL_TABLET | ORAL | Status: DC
Start: ? — End: 2020-04-27

## 2020-04-27 MED ORDER — APIXABAN 5 MG PO TABS
5.00 | ORAL_TABLET | ORAL | Status: DC
Start: 2020-04-27 — End: 2020-04-27

## 2020-04-27 MED ORDER — GENERIC EXTERNAL MEDICATION
Status: DC
Start: ? — End: 2020-04-27

## 2020-04-27 MED ORDER — ASPIRIN 81 MG PO CHEW
81.00 | CHEWABLE_TABLET | ORAL | Status: DC
Start: 2020-04-28 — End: 2020-04-27

## 2020-04-27 MED ORDER — ACETAMINOPHEN 325 MG PO TABS
650.00 | ORAL_TABLET | ORAL | Status: DC
Start: ? — End: 2020-04-27

## 2020-04-27 MED ORDER — LEVOTHYROXINE SODIUM 25 MCG PO TABS
50.00 | ORAL_TABLET | ORAL | Status: DC
Start: 2020-04-28 — End: 2020-04-27

## 2020-04-27 MED ORDER — MELATONIN 3 MG PO TABS
3.00 | ORAL_TABLET | ORAL | Status: DC
Start: 2020-04-27 — End: 2020-04-27

## 2020-04-27 MED ORDER — PANTOPRAZOLE SODIUM 40 MG PO TBEC
40.00 | DELAYED_RELEASE_TABLET | ORAL | Status: DC
Start: 2020-04-28 — End: 2020-04-27

## 2020-04-27 MED ORDER — POLYETHYLENE GLYCOL 3350 17 GM/SCOOP PO POWD
17.00 | ORAL | Status: DC
Start: ? — End: 2020-04-27

## 2020-04-27 MED ORDER — ATORVASTATIN CALCIUM 80 MG PO TABS
80.00 | ORAL_TABLET | ORAL | Status: DC
Start: 2020-04-27 — End: 2020-04-27

## 2020-04-27 MED ORDER — POTASSIUM CHLORIDE CRYS ER 20 MEQ PO TBCR
20.00 | EXTENDED_RELEASE_TABLET | ORAL | Status: DC
Start: 2020-04-28 — End: 2020-04-27

## 2020-04-27 MED ORDER — ESCITALOPRAM OXALATE 10 MG PO TABS
10.00 | ORAL_TABLET | ORAL | Status: DC
Start: 2020-04-28 — End: 2020-04-27

## 2020-04-27 MED ORDER — CARBOXYMETHYLCELLULOSE SODIUM 1 % OP GEL
1.00 | OPHTHALMIC | Status: DC
Start: 2020-04-27 — End: 2020-04-27

## 2020-05-22 ENCOUNTER — Other Ambulatory Visit: Payer: Self-pay | Admitting: Surgery

## 2020-05-22 DIAGNOSIS — N63 Unspecified lump in unspecified breast: Secondary | ICD-10-CM

## 2020-05-30 ENCOUNTER — Ambulatory Visit
Admission: RE | Admit: 2020-05-30 | Discharge: 2020-05-30 | Disposition: A | Discharge status: Home / Self Care | Payer: Medicare Other | Source: Ambulatory Visit | Attending: Surgery | Admitting: Surgery

## 2020-05-30 DIAGNOSIS — N6315 Unspecified lump in the right breast, overlapping quadrants: Secondary | ICD-10-CM | POA: Diagnosis not present

## 2020-05-30 DIAGNOSIS — N63 Unspecified lump in unspecified breast: Secondary | ICD-10-CM

## 2020-08-03 ENCOUNTER — Inpatient Hospital Stay: Payer: Medicare Other

## 2020-08-10 ENCOUNTER — Ambulatory Visit: Payer: Medicare Other | Admitting: Oncology

## 2020-12-19 DIAGNOSIS — S52501D Unspecified fracture of the lower end of right radius, subsequent encounter for closed fracture with routine healing: Secondary | ICD-10-CM | POA: Diagnosis not present

## 2020-12-27 DIAGNOSIS — E039 Hypothyroidism, unspecified: Secondary | ICD-10-CM | POA: Diagnosis not present

## 2020-12-27 DIAGNOSIS — I6523 Occlusion and stenosis of bilateral carotid arteries: Secondary | ICD-10-CM | POA: Diagnosis not present

## 2020-12-27 DIAGNOSIS — I10 Essential (primary) hypertension: Secondary | ICD-10-CM | POA: Diagnosis not present

## 2020-12-27 DIAGNOSIS — Z66 Do not resuscitate: Secondary | ICD-10-CM | POA: Diagnosis not present

## 2020-12-27 DIAGNOSIS — G4733 Obstructive sleep apnea (adult) (pediatric): Secondary | ICD-10-CM | POA: Diagnosis not present

## 2020-12-27 DIAGNOSIS — F419 Anxiety disorder, unspecified: Secondary | ICD-10-CM | POA: Diagnosis not present

## 2020-12-27 DIAGNOSIS — E785 Hyperlipidemia, unspecified: Secondary | ICD-10-CM | POA: Diagnosis not present

## 2020-12-27 DIAGNOSIS — F32A Depression, unspecified: Secondary | ICD-10-CM | POA: Diagnosis not present

## 2020-12-27 DIAGNOSIS — R42 Dizziness and giddiness: Secondary | ICD-10-CM | POA: Diagnosis not present

## 2021-01-07 DIAGNOSIS — I6523 Occlusion and stenosis of bilateral carotid arteries: Secondary | ICD-10-CM | POA: Diagnosis not present

## 2021-01-07 DIAGNOSIS — R0602 Shortness of breath: Secondary | ICD-10-CM | POA: Diagnosis not present

## 2021-01-07 DIAGNOSIS — E7800 Pure hypercholesterolemia, unspecified: Secondary | ICD-10-CM | POA: Diagnosis not present

## 2021-01-07 DIAGNOSIS — R002 Palpitations: Secondary | ICD-10-CM | POA: Diagnosis not present

## 2021-01-07 DIAGNOSIS — I10 Essential (primary) hypertension: Secondary | ICD-10-CM | POA: Diagnosis not present

## 2021-01-07 DIAGNOSIS — I2699 Other pulmonary embolism without acute cor pulmonale: Secondary | ICD-10-CM | POA: Diagnosis not present

## 2021-01-08 DIAGNOSIS — M6281 Muscle weakness (generalized): Secondary | ICD-10-CM | POA: Diagnosis not present

## 2021-01-09 DIAGNOSIS — F411 Generalized anxiety disorder: Secondary | ICD-10-CM | POA: Diagnosis not present

## 2021-01-09 DIAGNOSIS — I10 Essential (primary) hypertension: Secondary | ICD-10-CM | POA: Diagnosis not present

## 2021-01-09 DIAGNOSIS — Z0000 Encounter for general adult medical examination without abnormal findings: Secondary | ICD-10-CM | POA: Diagnosis not present

## 2021-01-09 DIAGNOSIS — E7800 Pure hypercholesterolemia, unspecified: Secondary | ICD-10-CM | POA: Diagnosis not present

## 2021-01-09 DIAGNOSIS — R0602 Shortness of breath: Secondary | ICD-10-CM | POA: Diagnosis not present

## 2021-01-09 DIAGNOSIS — E039 Hypothyroidism, unspecified: Secondary | ICD-10-CM | POA: Diagnosis not present

## 2021-01-09 DIAGNOSIS — Z86711 Personal history of pulmonary embolism: Secondary | ICD-10-CM | POA: Diagnosis not present

## 2021-01-09 DIAGNOSIS — R002 Palpitations: Secondary | ICD-10-CM | POA: Diagnosis not present

## 2021-01-09 DIAGNOSIS — N289 Disorder of kidney and ureter, unspecified: Secondary | ICD-10-CM | POA: Diagnosis not present

## 2021-01-22 DIAGNOSIS — R002 Palpitations: Secondary | ICD-10-CM | POA: Diagnosis not present

## 2021-01-22 DIAGNOSIS — R0602 Shortness of breath: Secondary | ICD-10-CM | POA: Diagnosis not present

## 2021-02-05 DIAGNOSIS — G4733 Obstructive sleep apnea (adult) (pediatric): Secondary | ICD-10-CM | POA: Diagnosis not present

## 2021-02-12 DIAGNOSIS — M5489 Other dorsalgia: Secondary | ICD-10-CM | POA: Diagnosis not present

## 2021-02-12 DIAGNOSIS — I10 Essential (primary) hypertension: Secondary | ICD-10-CM | POA: Diagnosis not present

## 2021-02-12 DIAGNOSIS — I480 Paroxysmal atrial fibrillation: Secondary | ICD-10-CM | POA: Diagnosis present

## 2021-02-12 DIAGNOSIS — E7800 Pure hypercholesterolemia, unspecified: Secondary | ICD-10-CM | POA: Diagnosis not present

## 2021-02-12 DIAGNOSIS — I6523 Occlusion and stenosis of bilateral carotid arteries: Secondary | ICD-10-CM | POA: Diagnosis not present

## 2021-02-12 DIAGNOSIS — G4733 Obstructive sleep apnea (adult) (pediatric): Secondary | ICD-10-CM | POA: Diagnosis not present

## 2021-02-27 ENCOUNTER — Observation Stay
Admission: EM | Admit: 2021-02-27 | Discharge: 2021-03-01 | Disposition: A | Discharge status: Home / Self Care | Payer: Medicare HMO | Attending: Internal Medicine | Admitting: Internal Medicine

## 2021-02-27 ENCOUNTER — Emergency Department: Payer: Medicare HMO

## 2021-02-27 ENCOUNTER — Other Ambulatory Visit: Payer: Self-pay

## 2021-02-27 ENCOUNTER — Observation Stay: Payer: Medicare HMO

## 2021-02-27 DIAGNOSIS — R410 Disorientation, unspecified: Secondary | ICD-10-CM | POA: Diagnosis not present

## 2021-02-27 DIAGNOSIS — I480 Paroxysmal atrial fibrillation: Secondary | ICD-10-CM | POA: Insufficient documentation

## 2021-02-27 DIAGNOSIS — R4781 Slurred speech: Secondary | ICD-10-CM | POA: Diagnosis not present

## 2021-02-27 DIAGNOSIS — G459 Transient cerebral ischemic attack, unspecified: Secondary | ICD-10-CM | POA: Diagnosis not present

## 2021-02-27 DIAGNOSIS — G9389 Other specified disorders of brain: Secondary | ICD-10-CM | POA: Diagnosis not present

## 2021-02-27 DIAGNOSIS — K219 Gastro-esophageal reflux disease without esophagitis: Secondary | ICD-10-CM | POA: Insufficient documentation

## 2021-02-27 DIAGNOSIS — G4733 Obstructive sleep apnea (adult) (pediatric): Secondary | ICD-10-CM

## 2021-02-27 DIAGNOSIS — E039 Hypothyroidism, unspecified: Secondary | ICD-10-CM | POA: Insufficient documentation

## 2021-02-27 DIAGNOSIS — N189 Chronic kidney disease, unspecified: Secondary | ICD-10-CM | POA: Diagnosis not present

## 2021-02-27 DIAGNOSIS — F32A Depression, unspecified: Secondary | ICD-10-CM | POA: Insufficient documentation

## 2021-02-27 DIAGNOSIS — Z20822 Contact with and (suspected) exposure to covid-19: Secondary | ICD-10-CM | POA: Diagnosis not present

## 2021-02-27 DIAGNOSIS — Z9989 Dependence on other enabling machines and devices: Secondary | ICD-10-CM | POA: Diagnosis not present

## 2021-02-27 DIAGNOSIS — F419 Anxiety disorder, unspecified: Secondary | ICD-10-CM | POA: Insufficient documentation

## 2021-02-27 DIAGNOSIS — R531 Weakness: Secondary | ICD-10-CM | POA: Diagnosis not present

## 2021-02-27 DIAGNOSIS — Z7901 Long term (current) use of anticoagulants: Secondary | ICD-10-CM | POA: Diagnosis not present

## 2021-02-27 DIAGNOSIS — R4182 Altered mental status, unspecified: Secondary | ICD-10-CM | POA: Diagnosis not present

## 2021-02-27 DIAGNOSIS — Z79899 Other long term (current) drug therapy: Secondary | ICD-10-CM | POA: Diagnosis not present

## 2021-02-27 DIAGNOSIS — I129 Hypertensive chronic kidney disease with stage 1 through stage 4 chronic kidney disease, or unspecified chronic kidney disease: Secondary | ICD-10-CM | POA: Diagnosis not present

## 2021-02-27 DIAGNOSIS — I160 Hypertensive urgency: Secondary | ICD-10-CM | POA: Diagnosis not present

## 2021-02-27 DIAGNOSIS — Z880 Allergy status to penicillin: Secondary | ICD-10-CM | POA: Diagnosis not present

## 2021-02-27 DIAGNOSIS — I10 Essential (primary) hypertension: Secondary | ICD-10-CM | POA: Diagnosis not present

## 2021-02-27 DIAGNOSIS — Z743 Need for continuous supervision: Secondary | ICD-10-CM | POA: Diagnosis not present

## 2021-02-27 DIAGNOSIS — G4730 Sleep apnea, unspecified: Secondary | ICD-10-CM | POA: Diagnosis present

## 2021-02-27 DIAGNOSIS — Z2839 Other underimmunization status: Secondary | ICD-10-CM | POA: Diagnosis not present

## 2021-02-27 DIAGNOSIS — R6889 Other general symptoms and signs: Secondary | ICD-10-CM | POA: Diagnosis not present

## 2021-02-27 DIAGNOSIS — R29818 Other symptoms and signs involving the nervous system: Secondary | ICD-10-CM | POA: Diagnosis not present

## 2021-02-27 DIAGNOSIS — R404 Transient alteration of awareness: Secondary | ICD-10-CM | POA: Diagnosis not present

## 2021-02-27 DIAGNOSIS — E785 Hyperlipidemia, unspecified: Secondary | ICD-10-CM | POA: Insufficient documentation

## 2021-02-27 LAB — CBC WITH DIFFERENTIAL/PLATELET
Abs Immature Granulocytes: 0.11 10*3/uL — ABNORMAL HIGH (ref 0.00–0.07)
Basophils Absolute: 0 10*3/uL (ref 0.0–0.1)
Basophils Relative: 0 %
Eosinophils Absolute: 0.1 10*3/uL (ref 0.0–0.5)
Eosinophils Relative: 0 %
HCT: 43.6 % (ref 36.0–46.0)
Hemoglobin: 14.4 g/dL (ref 12.0–15.0)
Immature Granulocytes: 1 %
Lymphocytes Relative: 13 %
Lymphs Abs: 1.8 10*3/uL (ref 0.7–4.0)
MCH: 29.6 pg (ref 26.0–34.0)
MCHC: 33 g/dL (ref 30.0–36.0)
MCV: 89.7 fL (ref 80.0–100.0)
Monocytes Absolute: 0.8 10*3/uL (ref 0.1–1.0)
Monocytes Relative: 6 %
Neutro Abs: 11 10*3/uL — ABNORMAL HIGH (ref 1.7–7.7)
Neutrophils Relative %: 80 %
Platelets: 298 10*3/uL (ref 150–400)
RBC: 4.86 MIL/uL (ref 3.87–5.11)
RDW: 15.1 % (ref 11.5–15.5)
WBC: 13.8 10*3/uL — ABNORMAL HIGH (ref 4.0–10.5)
nRBC: 0 % (ref 0.0–0.2)

## 2021-02-27 LAB — LIPID PANEL
Cholesterol: 182 mg/dL (ref 0–200)
HDL: 93 mg/dL (ref >40)
LDL Cholesterol: 60 mg/dL (ref 0–99)
Total CHOL/HDL Ratio: 2 RATIO
Triglycerides: 146 mg/dL (ref <150)
VLDL: 29 mg/dL (ref 0–40)

## 2021-02-27 LAB — URINALYSIS, COMPLETE (UACMP) WITH MICROSCOPIC
Bacteria, UA: NONE SEEN
Bilirubin Urine: NEGATIVE
Glucose, UA: NEGATIVE mg/dL
Ketones, ur: NEGATIVE mg/dL
Leukocytes,Ua: NEGATIVE
Nitrite: NEGATIVE
Protein, ur: NEGATIVE mg/dL
Specific Gravity, Urine: 1.004 — ABNORMAL LOW (ref 1.005–1.030)
Squamous Epithelial / HPF: NONE SEEN (ref 0–5)
pH: 9 — ABNORMAL HIGH (ref 5.0–8.0)

## 2021-02-27 LAB — COMPREHENSIVE METABOLIC PANEL
ALT: 18 U/L (ref 0–44)
AST: 26 U/L (ref 15–41)
Albumin: 4.2 g/dL (ref 3.5–5.0)
Alkaline Phosphatase: 63 U/L (ref 38–126)
Anion gap: 10 (ref 5–15)
BUN: 17 mg/dL (ref 8–23)
CO2: 26 mmol/L (ref 22–32)
Calcium: 9.4 mg/dL (ref 8.9–10.3)
Chloride: 103 mmol/L (ref 98–111)
Creatinine, Ser: 0.91 mg/dL (ref 0.44–1.00)
GFR, Estimated: >60 mL/min (ref >60)
Glucose, Bld: 111 mg/dL — ABNORMAL HIGH (ref 70–99)
Potassium: 3.7 mmol/L (ref 3.5–5.1)
Sodium: 139 mmol/L (ref 135–145)
Total Bilirubin: 0.8 mg/dL (ref 0.3–1.2)
Total Protein: 7.5 g/dL (ref 6.5–8.1)

## 2021-02-27 LAB — AMMONIA: Ammonia: 28 umol/L (ref 9–35)

## 2021-02-27 LAB — TROPONIN I (HIGH SENSITIVITY)
Troponin I (High Sensitivity): 10 ng/L (ref <18)
Troponin I (High Sensitivity): 10 ng/L (ref <18)

## 2021-02-27 MED ORDER — METOCLOPRAMIDE HCL 10 MG PO TABS: 5.0000 mg | ORAL_TABLET | Freq: Three times a day (TID) | ORAL | Status: DC

## 2021-02-27 MED ORDER — METOCLOPRAMIDE HCL 5 MG PO TABS
5.0000 mg | ORAL_TABLET | Freq: Three times a day (TID) | ORAL | Status: DC
  Administered 2021-02-28 – 2021-03-01 (×4): 5 mg via ORAL
  Filled 2021-02-27 (×5): qty 1

## 2021-02-27 MED ORDER — LEVOTHYROXINE SODIUM 50 MCG PO TABS
50.0000 ug | ORAL_TABLET | Freq: Every day | ORAL | Status: DC
  Administered 2021-02-28 – 2021-03-01 (×2): 50 ug via ORAL
  Filled 2021-02-27 (×2): qty 1

## 2021-02-27 MED ORDER — HYDRALAZINE HCL 50 MG PO TABS: 25.0000 mg | ORAL_TABLET | Freq: Three times a day (TID) | ORAL | Status: DC | PRN

## 2021-02-27 MED ORDER — PANTOPRAZOLE SODIUM 40 MG PO TBEC: 40.0000 mg | DELAYED_RELEASE_TABLET | Freq: Every day | ORAL | Status: DC

## 2021-02-27 MED ORDER — ACETAMINOPHEN 325 MG PO TABS
325.0000 mg | ORAL_TABLET | Freq: Four times a day (QID) | ORAL | Status: DC | PRN
  Administered 2021-02-28 – 2021-03-01 (×3): 325 mg via ORAL
  Filled 2021-02-27 (×3): qty 1

## 2021-02-27 MED ORDER — ENSURE ENLIVE PO LIQD
237.0000 mL | Freq: Two times a day (BID) | ORAL | Status: DC
  Administered 2021-02-27 – 2021-03-01 (×4): 237 mL via ORAL

## 2021-02-27 MED ORDER — ROSUVASTATIN CALCIUM 20 MG PO TABS
40.0000 mg | ORAL_TABLET | Freq: Every day | ORAL | Status: DC
  Filled 2021-02-27: qty 2

## 2021-02-27 MED ORDER — LACTATED RINGERS IV BOLUS
500.0000 mL | Freq: Once | INTRAVENOUS | Status: AC
  Administered 2021-02-27: 500 mL via INTRAVENOUS

## 2021-02-27 MED ORDER — ACETAMINOPHEN 650 MG RE SUPP: 325.0000 mg | Freq: Four times a day (QID) | RECTAL | Status: DC | PRN

## 2021-02-27 MED ORDER — ONDANSETRON HCL 4 MG/2ML IJ SOLN
4.0000 mg | Freq: Four times a day (QID) | INTRAMUSCULAR | Status: DC | PRN
  Administered 2021-02-28: 4 mg via INTRAVENOUS
  Filled 2021-02-27: qty 2

## 2021-02-27 MED ORDER — LORAZEPAM 2 MG PO TABS
2.0000 mg | ORAL_TABLET | Freq: Three times a day (TID) | ORAL | Status: DC | PRN
  Administered 2021-02-27 – 2021-03-01 (×2): 2 mg via ORAL
  Filled 2021-02-27 (×2): qty 1

## 2021-02-27 MED ORDER — APIXABAN 2.5 MG PO TABS
2.5000 mg | ORAL_TABLET | Freq: Two times a day (BID) | ORAL | Status: DC
  Administered 2021-02-27 – 2021-03-01 (×4): 2.5 mg via ORAL
  Filled 2021-02-27 (×4): qty 1

## 2021-02-27 MED ORDER — PANTOPRAZOLE SODIUM 40 MG PO TBEC
40.0000 mg | DELAYED_RELEASE_TABLET | Freq: Every day | ORAL | Status: DC
  Administered 2021-02-28 – 2021-03-01 (×2): 40 mg via ORAL
  Filled 2021-02-27 (×2): qty 1

## 2021-02-27 MED ORDER — ONDANSETRON HCL 4 MG PO TABS: 4.0000 mg | ORAL_TABLET | Freq: Four times a day (QID) | ORAL | Status: DC | PRN

## 2021-02-27 MED ORDER — ISOSORBIDE MONONITRATE ER 60 MG PO TB24
30.0000 mg | ORAL_TABLET | Freq: Every day | ORAL | Status: DC
  Administered 2021-02-28 – 2021-03-01 (×2): 30 mg via ORAL
  Filled 2021-02-27 (×4): qty 1

## 2021-02-27 MED ORDER — ESCITALOPRAM OXALATE 10 MG PO TABS: 10.0000 mg | ORAL_TABLET | Freq: Every day | ORAL | Status: DC

## 2021-02-27 MED ORDER — ASPIRIN 81 MG PO CHEW
324.0000 mg | CHEWABLE_TABLET | Freq: Once | ORAL | Status: AC
  Administered 2021-02-27: 324 mg via ORAL
  Filled 2021-02-27: qty 4

## 2021-02-27 MED ORDER — AMLODIPINE BESYLATE 5 MG PO TABS
10.0000 mg | ORAL_TABLET | Freq: Every day | ORAL | Status: DC
  Administered 2021-02-27 – 2021-03-01 (×3): 10 mg via ORAL
  Filled 2021-02-27 (×3): qty 2

## 2021-02-27 MED ORDER — ROSUVASTATIN CALCIUM 20 MG PO TABS: 40.0000 mg | ORAL_TABLET | Freq: Every day | ORAL | Status: DC

## 2021-02-27 MED ORDER — ESCITALOPRAM OXALATE 10 MG PO TABS
10.0000 mg | ORAL_TABLET | Freq: Every day | ORAL | Status: DC
  Administered 2021-02-28 – 2021-03-01 (×2): 10 mg via ORAL
  Filled 2021-02-27 (×2): qty 1

## 2021-02-27 NOTE — H&P (Addendum)
History and Physical   Erin Sutton EXB:[id number] DOB: 10-15-1941 DOA: 02/27/2021  PCP: Baxter Hire, MD  Outpatient Specialists: Dr. Nehemiah Massed Patient coming from: home   I have personally briefly reviewed patient's old medical records in [place].  Chief Concern: weakness  HPI: Erin Sutton is a 80 y.o. female with medical history significant for atrial fibrillation, bilateral carotid artery stenosis, hyperlipidemia, hypertension, obstructive sleep apnea, hypothyroid,  She reports that the weakness and slowed speech has been progressively worsening over the last year. She states that it has worsened over the last 4 days.  She denies sick contacts.  She states that when she wakes up initially in the morning she is very weak.  She states that with increased activity throughout the day she feels little bit better.  However in the evening she feels weak and tired again.  Of note, she previously took monthly B12 injections.  This was discontinued approximately 7 to 9 months ago, patient is unsure.   She further endorses bilateral heavy eyelids, over the last 4-5 days.  She also endorses bilateral lower extremity numbness, left greater than the right.  She does have dry eyes at baseline.  Social history: lives at home by herself. She has help from a niece that drives her to her doctor's appointments.   Vaccination: Patient is vaccinated for COVID-19  ROS: Constitutional: no weight change, no fever ENT/Mouth: no sore throat, no rhinorrhea Eyes: no eye pain, no vision changes Cardiovascular: no chest pain, no dyspnea,  no edema, no palpitations Respiratory: no cough, no sputum, no wheezing Gastrointestinal: no nausea, no vomiting, no diarrhea, no constipation Genitourinary: no urinary incontinence, no dysuria, no hematuria Musculoskeletal: no arthralgias, no myalgias Skin: no skin lesions, no pruritus, Neuro: + weakness, no loss of consciousness, no syncope Psych: no  anxiety, no depression, + decrease appetite Heme/Lymph: no bruising, no bleeding  ED Course: Discussed with ED provider, patient requiring hospitalization due to TIA.  Vitals in the emergency department was remarkable for temperature of 97.9, respiration rate of 15, heart rate of 65, blood pressure 158/73, patient SPO2 saturation of 95% on room air.  CT of the head without contrast was ordered by ED provider and read as no acute intracranial abnormality.  Chest x-ray was read as stable chest, no active coronary pulmonary process.  High-sensitivity troponin was 10, x2  Labs in the emergency department remarkable for glucose of 111, WBC 13.8, UA was negative for nitrates and leukocytes. Patient was loaded with aspirin 324 mg per ED provider LR 500 mL bolus.  Assessment/Plan  Active Problems:   Confusion   Hypothyroidism   HTN (hypertension)   GERD (gastroesophageal reflux disease)   Anxiety   Accelerated hypertension   Hyperlipidemia   Sleep apnea   Numbness and slurred speech with bilateral drooping eyelids Gradually worsening weakness throughout the day Query TIA, CVA B12 deficiency -Checking B12 level, ammonia, TSH -MRI of the brain without contrast -These are negative, a.m. team to consider consulting neurologist for possible myasthenia gravis versus Lambert-Eaton  Hypertension/hypertensive urgency -Resumed home amlodipine 10 mg daily, isosorbide mononitrate 30 mg daily -Hydralazine 25 mg every 8 hours as needed for SBP greater than 180  Obstructive sleep apnea-CPAP nightly ordered  Hypothyroid-resumed home levothyroxine 50 mcg q Breakfast  Hyperlipidemia-rosuvastatin 40 mg daily resumed  GERD-PPI  Depression -Escitalopram 10 mg daily  Anxiety-lorazepam 2 mg p.o. every 8 hours as needed for anxiety  History of paroxysmal atrial fibrillation-resumed home apixaban 2.5 mg p.o.  twice daily  Chart reviewed.   DVT prophylaxis: Apixaban 2.5 mg twice daily Code  Status: Full code Diet: Heart healthy Family Communication: Updated daughter Disposition Plan: Pending clinical course Consults called: None at this time Admission status: MedSurg, observation, with telemetry  Past Medical History:  Diagnosis Date  . Anxiety   . Arthritis   . Chronic kidney disease   . Constipation   . GERD (gastroesophageal reflux disease)   . Hypertension   . Hypothyroidism   . Osteoporosis   . Sleep apnea   . Urinary incontinence    Past Surgical History:  Procedure Laterality Date  . BREAST BIOPSY Right    neg/stereo  . BREAST BIOPSY Left    neg/stereo  . BREAST EXCISIONAL BIOPSY Right    neg  . CATARACT EXTRACTION, BILATERAL    . COLONOSCOPY WITH PROPOFOL N/A 07/02/2015   Procedure: COLONOSCOPY WITH PROPOFOL;  Surgeon: Manya Silvas, MD;  Location: [facility] ENDOSCOPY;  Service: Endoscopy;  Laterality: N/A;  . CORRECTION HAMMER TOE Right   . ESOPHAGOGASTRODUODENOSCOPY (EGD) WITH PROPOFOL N/A 07/02/2015   Procedure: ESOPHAGOGASTRODUODENOSCOPY (EGD) WITH PROPOFOL;  Surgeon: Manya Silvas, MD;  Location: [facility] ENDOSCOPY;  Service: Endoscopy;  Laterality: N/A;  . JOINT REPLACEMENT Left    knee  . REPLACEMENT TOTAL KNEE Left   . SKIN BIOPSY Right    Social History:  reports that she has never smoked. She has never used smokeless tobacco. She reports that she does not drink alcohol and does not use drugs.  Allergies  Allergen Reactions  . Ace Inhibitors Cough  . Levofloxacin   . Penicillin G Other (See Comments)  . Tape Other (See Comments)    Other reaction(s): Other (See Comments)  . Amoxicillin Rash    Has patient had a PCN reaction causing immediate rash, facial/tongue/throat swelling, SOB or lightheadedness with hypotension: No Has patient had a PCN reaction causing severe rash involving mucus membranes or skin necrosis: No Has patient had a PCN reaction that required hospitalization: No Has patient had a PCN reaction occurring within the last 10  years: No If all of the above answers are "NO", then may proceed with Cephalosporin use.   Marland Kitchen Benicar [Olmesartan] Rash    Itching rash  . Codeine Rash  . Codeine Sulfate Rash  . Levaquin [Levofloxacin In D5w] Rash    Alters mental status  . Penicillin V Potassium Rash    Has patient had a PCN reaction causing immediate rash, facial/tongue/throat swelling, SOB or lightheadedness with hypotension: No Has patient had a PCN reaction causing severe rash involving mucus membranes or skin necrosis: No Has patient had a PCN reaction that required hospitalization: No Has patient had a PCN reaction occurring within the last 10 years: No If all of the above answers are "NO", then may proceed with Cephalosporin use.   . Sulfa Antibiotics Rash   Family History  Problem Relation Age of Onset  . Hypertension Mother   . Diabetes Mother   . Heart attack Mother   . Asthma Mother   . Peripheral vascular disease Mother   . Heart attack Father   . Hypertension Father   . Diabetes Father   . Colon cancer Maternal Aunt   . Breast cancer Cousin    Family history: Family history reviewed and not pertinent  Prior to Admission medications   Medication Sig Start Date End Date Taking? Authorizing Provider  amLODipine (NORVASC) 10 MG tablet Take 10 mg by mouth daily.    [provider]  apixaban (ELIQUIS) 2.5 MG TABS tablet Take 1 tablet (2.5 mg total) by mouth 2 (two) times daily. 02/02/20   Earlie Server, MD  cyanocobalamin (,VITAMIN B-12,) 1000 MCG/ML injection Inject 1,000 mcg into the muscle every 30 (thirty) days.    [provider]  escitalopram (LEXAPRO) 10 MG tablet Take 10 mg by mouth daily. 04/10/20   [provider]  feeding supplement, ENSURE ENLIVE, (ENSURE ENLIVE) LIQD Take 237 mLs by mouth 2 (two) times daily between meals. 07/05/19   Saundra Shelling, MD  ferrous fumarate (HEMOCYTE - 106 MG FE) 325 (106 Fe) MG TABS tablet Take 1 tablet (106 mg of iron total) by mouth  daily. 04/15/20   Sharen Hones, MD  isosorbide mononitrate (IMDUR) 30 MG 24 hr tablet Take 30 mg by mouth daily.    [provider]  levothyroxine (SYNTHROID, LEVOTHROID) 50 MCG tablet Take 50 mcg by mouth daily before breakfast.    [provider]  LORazepam (ATIVAN) 2 MG tablet Take 2 mg by mouth every 8 (eight) hours as needed for anxiety.     [provider]  metoCLOPramide (REGLAN) 5 MG tablet Take 1 tablet (5 mg total) by mouth 3 (three) times daily for 3 days. 04/15/20 04/18/20  Sharen Hones, MD  nystatin cream (MYCOSTATIN) Apply 1 application topically in the morning and at bedtime. (Under breast) 04/03/20   [provider]  ondansetron (ZOFRAN-ODT) 4 MG disintegrating tablet Take 4 mg by mouth every 8 (eight) hours as needed.  06/29/19   [provider]  pantoprazole (PROTONIX) 40 MG tablet Take 40 mg by mouth daily.    [provider]  Polyethyl Glycol-Propyl Glycol (SYSTANE OP) Place 1 drop into both eyes 2 (two) times daily.    [provider]  polyethylene glycol (MIRALAX / GLYCOLAX) 17 g packet Take 17 g by mouth as needed.    [provider]  potassium chloride SA (K-DUR,KLOR-CON) 20 MEQ tablet Take 20 mEq by mouth every morning.    [provider]  rosuvastatin (CRESTOR) 40 MG tablet Take 40 mg by mouth daily. 11/14/19   [provider]  senna-docusate (SENOKOT-S) 8.6-50 MG tablet Take 2 tablets by mouth 2 (two) times daily as needed for mild constipation. 04/15/20   Sharen Hones, MD   Physical Exam: Vitals:   02/27/21 1346 02/27/21 1728 02/27/21 1931 02/27/21 2234  BP: (!) 192/89 (!) 144/80 111/61 (!) 161/67  Pulse: 65 69 (!) 58 (!) 57  Resp: 16  16 16   Temp:  98.2 F (36.8 C) 98.5 F (36.9 C) 98.5 F (36.9 C)  TempSrc:  Oral    SpO2: 93% 99% 96% 93%  Weight:      Height:       Constitutional: appears age-appropriate, NAD, calm, comfortable Eyes: PERRL, lids are drooping and conjunctivae  normal, bilateral drooping eyelids ENMT: Mucous membranes are moist. Posterior pharynx clear of any exudate or lesions. Age-appropriate dentition. Hearing appropriate Neck: normal, supple, no masses, no thyromegaly Respiratory: clear to auscultation bilaterally, no wheezing, no crackles. Normal respiratory effort. No accessory muscle use.  Cardiovascular: Regular rate and rhythm, no murmurs / rubs / gallops. No extremity edema. 2+ pedal pulses. No carotid bruits.  Abdomen: no tenderness, no masses palpated, no hepatosplenomegaly. Bowel sounds positive.  Musculoskeletal: no clubbing / cyanosis. No joint deformity upper and lower extremities. Good ROM, no contractures, no atrophy. Normal muscle tone.  Skin: no rashes, lesions, ulcers. No induration Neurologic: Sensation intact. Strength 5/5 in  all 4.  Psychiatric: Normal judgment and insight. Alert and oriented x 3. Normal mood.   EKG: independently reviewed, showing sinus rhythm with rate of 82, QTc 482, right bundle branch block  Chest x-ray on Admission: I personally reviewed and I agree with radiologist reading as below.  DG Chest 2 View  Result Date: 02/27/2021 CLINICAL DATA:  Weakness, altered mental status and slurred speech for 4 days. EXAM: CHEST - 2 VIEW COMPARISON:  Radiographs 09/02/2019.  CT 07/16/2019. FINDINGS: 1239 hours. The heart size and mediastinal contours are stable with aortic atherosclerosis. The lungs are clear. There is no pleural effusion or pneumothorax. Glenohumeral and thoracic spine degenerative changes are noted. Telemetry leads overlie the chest. IMPRESSION: Stable chest.  No active cardiopulmonary process. Electronically Signed   By: Richardean Sale M.D.   On: 02/27/2021 13:13   CT Head Wo Contrast  Result Date: 02/27/2021 CLINICAL DATA:  Neuro deficit.  Altered slurred speech for 4 days. EXAM: CT HEAD WITHOUT CONTRAST TECHNIQUE: Contiguous axial images were obtained from the base of the skull through the vertex  without intravenous contrast. COMPARISON:  04/13/2020 FINDINGS: Brain: No evidence of acute infarction, hemorrhage, hydrocephalus, extra-axial collection or mass lesion/mass effect. Vascular: No hyperdense vessel or unexpected calcification. Skull: Normal. Negative for fracture or focal lesion. Sinuses/Orbits: Mastoid air cells and paranasal sinuses are clear. Other: None IMPRESSION: 1. No acute intracranial abnormality. Electronically Signed   By: Kerby Moors M.D.   On: 02/27/2021 13:23   MR BRAIN WO CONTRAST  Result Date: 02/27/2021 CLINICAL DATA:  Initial evaluation for acute altered mental status, weakness, confusion, slurred speech. EXAM: MRI HEAD WITHOUT CONTRAST TECHNIQUE: Multiplanar, multiecho pulse sequences of the brain and surrounding structures were obtained without intravenous contrast. COMPARISON:  Prior CT from earlier the same day. FINDINGS: Brain: Cerebral volume within normal limits for age. Mild hazy T2/FLAIR hyperintensity within the periventricular and deep white matter both cerebral hemispheres most consistent with chronic small vessel ischemic disease, mild in nature. Small remote lacunar infarct noted within the left thalamus. Few small foci of encephalomalacia and gliosis involving the cortical/subcortical left parieto-occipital region consistent with tiny remote ischemic infarcts (series 15, images 35, 30). No abnormal foci of restricted diffusion to suggest acute or subacute ischemia. Gray-white matter differentiation maintained. No encephalomalacia to suggest chronic cortical infarction elsewhere within the brain. No acute intracranial hemorrhage. Single punctate chronic microhemorrhage noted at the anterior left frontal lobe, of doubtful significance in isolation. No mass lesion, midline shift or mass effect. No hydrocephalus or extra-axial fluid collection. Pituitary gland suprasellar region within normal limits. Midline structures intact. Vascular: Major intracranial vascular  flow voids are maintained. Skull and upper cervical spine: Craniocervical junction within normal limits. Bone marrow signal intensity normal. No scalp soft tissue abnormality. Sinuses/Orbits: Patient status post bilateral ocular lens replacement. Globes and orbital soft tissues demonstrate no acute finding. Paranasal sinuses are largely clear. No mastoid effusion. Inner ear structures within normal limits. Other: None. IMPRESSION: 1. No acute intracranial abnormality. 2. 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 3. Underlying mild chronic microvascular ischemic disease for age. Electronically Signed   By: Jeannine Boga M.D.   On: 02/27/2021 22:55   Labs on Admission: I have personally reviewed following labs  CBC: Recent Labs  Lab 02/27/21 1220  WBC 13.8*  NEUTROABS 11.0*  HGB 14.4  HCT 43.6  MCV 89.7  PLT 789   Basic Metabolic Panel: Recent Labs  Lab 02/27/21 1220  NA  139  K 3.7  CL 103  CO2 26  GLUCOSE 111*  BUN 17  CREATININE 0.91  CALCIUM 9.4   GFR: Estimated Creatinine Clearance: 46.9 mL/min (by C-G formula based on SCr of 0.91 mg/dL).  Liver Function Tests: Recent Labs  Lab 02/27/21 1220  AST 26  ALT 18  ALKPHOS 63  BILITOT 0.8  PROT 7.5  ALBUMIN 4.2   Urine analysis:    Component Value Date/Time   COLORURINE COLORLESS (A) 02/27/2021 1225   APPEARANCEUR CLEAR (A) 02/27/2021 1225   APPEARANCEUR Hazy 07/25/2014 1350   LABSPEC 1.004 (L) 02/27/2021 1225   LABSPEC 1.020 07/25/2014 1350   PHURINE 9.0 (H) 02/27/2021 1225   GLUCOSEU NEGATIVE 02/27/2021 1225   GLUCOSEU Negative 07/25/2014 1350   HGBUR SMALL (A) 02/27/2021 1225   BILIRUBINUR NEGATIVE 02/27/2021 1225   BILIRUBINUR Negative 07/25/2014 1350   KETONESUR NEGATIVE 02/27/2021 1225   PROTEINUR NEGATIVE 02/27/2021 1225   NITRITE NEGATIVE 02/27/2021 1225   LEUKOCYTESUR NEGATIVE 02/27/2021 1225   LEUKOCYTESUR 2+ 07/25/2014 1350   Lun Muro N Ayelet Gruenewald  D.O. [facility]  If 7PM-7AM, please contact overnight-coverage provider If 7AM-7PM, please contact day coverage provider [email or web address]  02/27/2021, 11:01 PM

## 2021-02-27 NOTE — ED Notes (Signed)
Informed RN bed assigned 1535
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
## 2021-02-27 NOTE — ED Triage Notes (Signed)
Per axox4 now , per ems altered and slurred speech x 4 days ,

## 2021-02-28 ENCOUNTER — Observation Stay
Admit: 2021-02-28 | Discharge: 2021-02-28 | Disposition: A | Discharge status: Home / Self Care | Payer: Medicare HMO | Attending: Internal Medicine | Admitting: Internal Medicine

## 2021-02-28 DIAGNOSIS — I639 Cerebral infarction, unspecified: Secondary | ICD-10-CM

## 2021-02-28 DIAGNOSIS — R410 Disorientation, unspecified: Secondary | ICD-10-CM | POA: Diagnosis not present

## 2021-02-28 DIAGNOSIS — I10 Essential (primary) hypertension: Secondary | ICD-10-CM | POA: Diagnosis not present

## 2021-02-28 DIAGNOSIS — G4733 Obstructive sleep apnea (adult) (pediatric): Secondary | ICD-10-CM | POA: Diagnosis not present

## 2021-02-28 DIAGNOSIS — R531 Weakness: Secondary | ICD-10-CM | POA: Diagnosis not present

## 2021-02-28 LAB — CBC
HCT: 37.6 % (ref 36.0–46.0)
Hemoglobin: 12.5 g/dL (ref 12.0–15.0)
MCH: 29.6 pg (ref 26.0–34.0)
MCHC: 33.2 g/dL (ref 30.0–36.0)
MCV: 89.1 fL (ref 80.0–100.0)
Platelets: 268 10*3/uL (ref 150–400)
RBC: 4.22 MIL/uL (ref 3.87–5.11)
RDW: 15 % (ref 11.5–15.5)
WBC: 13.8 10*3/uL — ABNORMAL HIGH (ref 4.0–10.5)
nRBC: 0 % (ref 0.0–0.2)

## 2021-02-28 LAB — ECHOCARDIOGRAM COMPLETE BUBBLE STUDY
AR max vel: 2.81 cm2
AV Area VTI: 2.98 cm2
AV Area mean vel: 2.72 cm2
AV Mean grad: 3 mmHg
AV Peak grad: 5.1 mmHg
Ao pk vel: 1.13 m/s
Area-P 1/2: 2.56 cm2
MV VTI: 3.3 cm2
S' Lateral: 2.1 cm

## 2021-02-28 LAB — COMPREHENSIVE METABOLIC PANEL
ALT: 16 U/L (ref 0–44)
AST: 21 U/L (ref 15–41)
Albumin: 3.4 g/dL — ABNORMAL LOW (ref 3.5–5.0)
Alkaline Phosphatase: 47 U/L (ref 38–126)
Anion gap: 7 (ref 5–15)
BUN: 21 mg/dL (ref 8–23)
CO2: 28 mmol/L (ref 22–32)
Calcium: 9.2 mg/dL (ref 8.9–10.3)
Chloride: 105 mmol/L (ref 98–111)
Creatinine, Ser: 0.87 mg/dL (ref 0.44–1.00)
GFR, Estimated: >60 mL/min (ref >60)
Glucose, Bld: 98 mg/dL (ref 70–99)
Potassium: 3.9 mmol/L (ref 3.5–5.1)
Sodium: 140 mmol/L (ref 135–145)
Total Bilirubin: 0.7 mg/dL (ref 0.3–1.2)
Total Protein: 6.1 g/dL — ABNORMAL LOW (ref 6.5–8.1)

## 2021-02-28 LAB — PHOSPHORUS: Phosphorus: 3.9 mg/dL (ref 2.5–4.6)

## 2021-02-28 LAB — MAGNESIUM: Magnesium: 2.2 mg/dL (ref 1.7–2.4)

## 2021-02-28 LAB — TSH: TSH: 2.446 u[IU]/mL (ref 0.350–4.500)

## 2021-02-28 LAB — CK: Total CK: 37 U/L — ABNORMAL LOW (ref 38–234)

## 2021-02-28 LAB — SARS CORONAVIRUS 2 (TAT 6-24 HRS): SARS Coronavirus 2: NEGATIVE

## 2021-02-28 MED ORDER — POLYVINYL ALCOHOL 1.4 % OP SOLN
1.0000 [drp] | OPHTHALMIC | Status: DC | PRN
  Filled 2021-02-28 (×2): qty 15

## 2021-02-28 MED ORDER — PERFLUTREN LIPID MICROSPHERE
1.0000 mL | INTRAVENOUS | Status: AC | PRN
Start: 2021-02-28 — End: 2021-02-28
  Administered 2021-02-28: 2 mL via INTRAVENOUS
  Filled 2021-02-28: qty 10

## 2021-02-28 MED ORDER — SALINE SPRAY 0.65 % NA SOLN
1.0000 | NASAL | Status: DC | PRN
  Filled 2021-02-28 (×2): qty 44

## 2021-02-28 NOTE — Discharge Summary (Signed)
Physician Discharge Summary  Patient ID: Erin Sutton MRN: [id number] DOB/AGE: [birth year] 80 y.o.  Admit date: 02/27/2021 Discharge date: 02/28/2021  Admission Diagnoses:  Discharge Diagnoses:  Active Problems:   Confusion   Hypothyroidism   HTN (hypertension)   GERD (gastroesophageal reflux disease)   Anxiety   Accelerated hypertension   Hyperlipidemia   Sleep apnea   Discharged Condition: good  Hospital Course:  Erin Sutton is a 80 y.o. female with medical history significant for atrial fibrillation, bilateral carotid artery stenosis, hyperlipidemia, hypertension, obstructive sleep apnea, hypothyroid, who present to the hospital with complaints of weakness and slow speech over the past year, she also had intermittent bilateral heavy eyelids. Upon arriving the hospital, MRI of the brain was performed, did not show any evidence of stroke.  Patient has been seen by Dr. Cheral Marker, I personally spoke to him, patient condition is not consistent with myasthenia gravis.  At this point, patient symptom largely improved.  She will be stable to be discharged. Followed by PCP and neurology as outpatient.    Consults: neurology  Significant Diagnostic Studies:   MRI: MRI HEAD WITHOUT CONTRAST  TECHNIQUE: Multiplanar, multiecho pulse sequences of the brain and surrounding structures were obtained without intravenous contrast.  COMPARISON:  Prior CT from earlier the same day.  FINDINGS: Brain: Cerebral volume within normal limits for age. Mild hazy T2/FLAIR hyperintensity within the periventricular and deep white matter both cerebral hemispheres most consistent with chronic small vessel ischemic disease, mild in nature. Small remote lacunar infarct noted within the left thalamus. Few small foci of encephalomalacia and gliosis involving the cortical/subcortical left parieto-occipital region consistent with tiny remote ischemic infarcts (series 15, images 35, 30).  No  abnormal foci of restricted diffusion to suggest acute or subacute ischemia. Gray-white matter differentiation maintained. No encephalomalacia to suggest chronic cortical infarction elsewhere within the brain. No acute intracranial hemorrhage. Single punctate chronic microhemorrhage noted at the anterior left frontal lobe, of doubtful significance in isolation.  No mass lesion, midline shift or mass effect. No hydrocephalus or extra-axial fluid collection. Pituitary gland suprasellar region within normal limits. Midline structures intact.  Vascular: Major intracranial vascular flow voids are maintained.  Skull and upper cervical spine: Craniocervical junction within normal limits. Bone marrow signal intensity normal. No scalp soft tissue abnormality.  Sinuses/Orbits: Patient status post bilateral ocular lens replacement. Globes and orbital soft tissues demonstrate no acute finding. Paranasal sinuses are largely clear. No mastoid effusion. Inner ear structures within normal limits.  Other: None.  IMPRESSION: 1. No acute intracranial abnormality. 2. 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 3. Underlying mild chronic microvascular ischemic disease for age.   Electronically Signed   By: Jeannine Boga M.D.   On: 02/27/2021 22:55  Treatments: stroke workup  Discharge Exam: Blood pressure 127/64, pulse 70, temperature 98.8 F (37.1 C), temperature source Oral, resp. rate 16, height 5' (1.524 m), weight 80 kg, SpO2 96 %. General appearance: alert and cooperative Resp: clear to auscultation bilaterally Cardio: regular rate and rhythm, S1, S2 normal, no murmur, click, rub or gallop GI: soft, non-tender; bowel sounds normal; no masses,  no organomegaly Extremities: extremities normal, atraumatic, no cyanosis or edema  Disposition: Discharge disposition: 01-Home or Self Care       Discharge Instructions     Diet - low sodium heart healthy   Complete by: As directed    Increase activity slowly   Complete by: As directed  Allergies as of 02/28/2021      Reactions   Ace Inhibitors Cough   Levofloxacin    Penicillin G Other (See Comments)   Tape Other (See Comments)   Other reaction(s): Other (See Comments)   Amoxicillin Rash   Has patient had a PCN reaction causing immediate rash, facial/tongue/throat swelling, SOB or lightheadedness with hypotension: No Has patient had a PCN reaction causing severe rash involving mucus membranes or skin necrosis: No Has patient had a PCN reaction that required hospitalization: No Has patient had a PCN reaction occurring within the last 10 years: No If all of the above answers are "NO", then may proceed with Cephalosporin use.   Benicar [olmesartan] Rash   Itching rash   Codeine Rash   Codeine Sulfate Rash   Levaquin [levofloxacin In D5w] Rash   Alters mental status   Penicillin V Potassium Rash   Has patient had a PCN reaction causing immediate rash, facial/tongue/throat swelling, SOB or lightheadedness with hypotension: No Has patient had a PCN reaction causing severe rash involving mucus membranes or skin necrosis: No Has patient had a PCN reaction that required hospitalization: No Has patient had a PCN reaction occurring within the last 10 years: No If all of the above answers are "NO", then may proceed with Cephalosporin use.   Sulfa Antibiotics Rash      Medication List    TAKE these medications   amLODipine 10 MG tablet Commonly known as: NORVASC Take 10 mg by mouth daily.   apixaban 2.5 MG Tabs tablet Commonly known as: Eliquis Take 1 tablet (2.5 mg total) by mouth 2 (two) times daily.   atorvastatin 40 MG tablet Commonly known as: LIPITOR Take 1 tablet by mouth daily.   carvedilol 3.125 MG tablet Commonly known as: COREG Take 3.125 mg by mouth 2 (two) times daily.   cyanocobalamin 1000 MCG/ML injection Commonly known as:  (VITAMIN B-12) Inject 1,000 mcg into the muscle every 30 (thirty) days.   escitalopram 10 MG tablet Commonly known as: LEXAPRO Take 10 mg by mouth daily.   feeding supplement Liqd Take 237 mLs by mouth 2 (two) times daily between meals.   ferrous fumarate 325 (106 Fe) MG Tabs tablet Commonly known as: HEMOCYTE - 106 mg FE Take 1 tablet (106 mg of iron total) by mouth daily.   isosorbide mononitrate 30 MG 24 hr tablet Commonly known as: IMDUR Take 30 mg by mouth daily.   levothyroxine 50 MCG tablet Commonly known as: SYNTHROID Take 50 mcg by mouth daily before breakfast.   LORazepam 2 MG tablet Commonly known as: ATIVAN Take 2 mg by mouth 3 (three) times daily.   LORazepam 0.5 MG tablet Commonly known as: ATIVAN Take 0.5 mg by mouth 3 (three) times daily as needed.   metoCLOPramide 5 MG tablet Commonly known as: Reglan Take 1 tablet (5 mg total) by mouth 3 (three) times daily for 3 days.   nystatin cream Commonly known as: MYCOSTATIN Apply 1 application topically in the morning and at bedtime. (Under breast)   ondansetron 4 MG disintegrating tablet Commonly known as: ZOFRAN-ODT Take 4 mg by mouth every 8 (eight) hours as needed.   pantoprazole 40 MG tablet Commonly known as: PROTONIX Take 40 mg by mouth daily.   polyethylene glycol 17 g packet Commonly known as: MIRALAX / GLYCOLAX Take 17 g by mouth as needed.   potassium chloride SA 20 MEQ tablet Commonly known as: KLOR-CON Take 20 mEq by mouth every morning.   rosuvastatin  40 MG tablet Commonly known as: CRESTOR Take 40 mg by mouth daily.   senna-docusate 8.6-50 MG tablet Commonly known as: Senokot-S Take 2 tablets by mouth 2 (two) times daily as needed for mild constipation.   SYSTANE OP Place 1 drop into both eyes 2 (two) times daily.       Follow-up Information    Baxter Hire, MD Follow up in 1 week(s).   Specialty: Internal Medicine Contact information: [place] [address] [phone]        [facility] Follow up in 2 week(s).   Contact information: [place] [phone]              Signed: Sharen Hones 02/28/2021, 2:47 PM

## 2021-02-28 NOTE — Care Management Obs Status (Signed)
[facility] NOTIFICATION   Patient Details  Name: Erin Sutton MRN: [id number] Date of Birth: 1941-06-18   Medicare Observation Status Notification Given:  Yes    Shelbie Hutching, RN 02/28/2021, 12:22 PM
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
## 2021-02-28 NOTE — Progress Notes (Signed)
*  PRELIMINARY RESULTS* Echocardiogram 2D Echocardiogram has been performed.  Wallie Char Juri Dinning 02/28/2021, 1:07 PM

## 2021-02-28 NOTE — Consult Note (Signed)
NEURO HOSPITALIST CONSULT NOTE   Requestig physician: Dr. Roosevelt Locks  Reason for Consult: Slurred speech, diffuse weakness and ptosis  History obtained from:  Patient and Chart    HPI:                                                                                                                                          Erin Sutton is an 80 y.o. female with a PMHx of paroxysmal atrial fibrillation (on Eliquis), CKD, arthritis, HTN, hypothyroidism, obstructive sleep apnea, urinary incontinence, DVT/PE, and osteoporosis who presented to the ED yesterday with chief complaints of worsening diffuse weakness, confusion and intermittent slurring of her speech for the past 4 days. This is on more chronic symptoms of approximately one year's duration, with progressive weakness and slowing of her speech. The weakness is diffuse, with all extremities being affected to an equal degree. Her dysarthria has waxed and waned. Hospitalist noted ptosis on exam as well. The patient denies recent fevers, cough, SOB, vomiting or diarrhea. The patient was severely hypertensive in the ED.   Regarding her weakness, when she wakes up initially in the AM she feels very weak. With increased activity during the day she feels a little bit better. In the evening the weakness and sense of tiredness worsens again. She has had bilateral "heavy eyelids" sensation, over the last 4-5 days. She also has been experiencing R > L bilateral lower extremity numbness.   CT head in the ED revealed no acute abnormality. CXR was unremarkable. U/A showed no signs of infection. WBC was elevated at 13.8.   Of note, she had an EMG/NCS at [facility] late last year to evaluate for diffuse muscle weakness, with no abnormalities found. Excerpt from the report follows:  "Summary of NCS: After identifying the patient in the waiting room and reviewing all appropriately available medical records, the patient was taken back to the examination  room where the procedure was explained, the sites of examination were noted, the patient's questions were answered and the patient's verbal consent for the procedure was obtained. Studies were performed at [facility] using a radiant warmer and [employer] Synergy EMG System. Nerve conduction studies were performed in selected right upper and lower extremity sensory and motor nerves, as recorded in the tables. All sensory and motor studies were normal.  Summary of needle EMG Needle examination was performed with a disposable concentric needle electrode in selected muscles of the symptomatic right upper and lower extremities, as described in the EMG narrative.  Insertional activity was normal and there was no abnormal spontaneous activity at rest. MUAPs were of normal size and demonstrated normal recruitment.  Conclusions: Normal study. There is no electrodiagnostic evidence for polyneuropathy or myopathy."  A search of the records from [place] reveals a  prior stroke work up, but no labs or notes documenting an assessment for possible myasthenia gravis.   Past Medical History:  Diagnosis Date  . Anxiety   . Arthritis   . Chronic kidney disease   . Constipation   . GERD (gastroesophageal reflux disease)   . Hypertension   . Hypothyroidism   . Osteoporosis   . Sleep apnea   . Urinary incontinence     Past Surgical History:  Procedure Laterality Date  . BREAST BIOPSY Right    neg/stereo  . BREAST BIOPSY Left    neg/stereo  . BREAST EXCISIONAL BIOPSY Right    neg  . CATARACT EXTRACTION, BILATERAL    . COLONOSCOPY WITH PROPOFOL N/A 07/02/2015   Procedure: COLONOSCOPY WITH PROPOFOL;  Surgeon: Manya Silvas, MD;  Location: [facility] ENDOSCOPY;  Service: Endoscopy;  Laterality: N/A;  . CORRECTION HAMMER TOE Right   . ESOPHAGOGASTRODUODENOSCOPY (EGD) WITH PROPOFOL N/A 07/02/2015   Procedure: ESOPHAGOGASTRODUODENOSCOPY (EGD) WITH PROPOFOL;  Surgeon: Manya Silvas, MD;  Location: [facility] ENDOSCOPY;   Service: Endoscopy;  Laterality: N/A;  . JOINT REPLACEMENT Left    knee  . REPLACEMENT TOTAL KNEE Left   . SKIN BIOPSY Right     Family History  Problem Relation Age of Onset  . Hypertension Mother   . Diabetes Mother   . Heart attack Mother   . Asthma Mother   . Peripheral vascular disease Mother   . Heart attack Father   . Hypertension Father   . Diabetes Father   . Colon cancer Maternal Aunt   . Breast cancer Cousin              Social History:  reports that she has never smoked. She has never used smokeless tobacco. She reports that she does not drink alcohol and does not use drugs.  Allergies  Allergen Reactions  . Ace Inhibitors Cough  . Levofloxacin   . Penicillin G Other (See Comments)  . Tape Other (See Comments)    Other reaction(s): Other (See Comments)  . Amoxicillin Rash    Has patient had a PCN reaction causing immediate rash, facial/tongue/throat swelling, SOB or lightheadedness with hypotension: No Has patient had a PCN reaction causing severe rash involving mucus membranes or skin necrosis: No Has patient had a PCN reaction that required hospitalization: No Has patient had a PCN reaction occurring within the last 10 years: No If all of the above answers are "NO", then may proceed with Cephalosporin use.   Marland Kitchen Benicar [Olmesartan] Rash    Itching rash  . Codeine Rash  . Codeine Sulfate Rash  . Levaquin [Levofloxacin In D5w] Rash    Alters mental status  . Penicillin V Potassium Rash    Has patient had a PCN reaction causing immediate rash, facial/tongue/throat swelling, SOB or lightheadedness with hypotension: No Has patient had a PCN reaction causing severe rash involving mucus membranes or skin necrosis: No Has patient had a PCN reaction that required hospitalization: No Has patient had a PCN reaction occurring within the last 10 years: No If all of the above answers are "NO", then may proceed with Cephalosporin use.   . Sulfa Antibiotics Rash     MEDICATIONS:  Prior to Admission:  Medications Prior to Admission  Medication Sig Dispense Refill Last Dose  . amLODipine (NORVASC) 10 MG tablet Take 10 mg by mouth daily.     Marland Kitchen apixaban (ELIQUIS) 2.5 MG TABS tablet Take 1 tablet (2.5 mg total) by mouth 2 (two) times daily. 360 tablet 0   . cyanocobalamin (,VITAMIN B-12,) 1000 MCG/ML injection Inject 1,000 mcg into the muscle every 30 (thirty) days.     Marland Kitchen escitalopram (LEXAPRO) 10 MG tablet Take 10 mg by mouth daily.     . feeding supplement, ENSURE ENLIVE, (ENSURE ENLIVE) LIQD Take 237 mLs by mouth 2 (two) times daily between meals. 237 mL 12   . ferrous fumarate (HEMOCYTE - 106 MG FE) 325 (106 Fe) MG TABS tablet Take 1 tablet (106 mg of iron total) by mouth daily. 30 tablet    . isosorbide mononitrate (IMDUR) 30 MG 24 hr tablet Take 30 mg by mouth daily.     Marland Kitchen levothyroxine (SYNTHROID, LEVOTHROID) 50 MCG tablet Take 50 mcg by mouth daily before breakfast.     . LORazepam (ATIVAN) 2 MG tablet Take 2 mg by mouth every 8 (eight) hours as needed for anxiety.      . metoCLOPramide (REGLAN) 5 MG tablet Take 1 tablet (5 mg total) by mouth 3 (three) times daily for 3 days. 90 tablet 1   . nystatin cream (MYCOSTATIN) Apply 1 application topically in the morning and at bedtime. (Under breast)     . ondansetron (ZOFRAN-ODT) 4 MG disintegrating tablet Take 4 mg by mouth every 8 (eight) hours as needed.      . pantoprazole (PROTONIX) 40 MG tablet Take 40 mg by mouth daily.     Vladimir Faster Glycol-Propyl Glycol (SYSTANE OP) Place 1 drop into both eyes 2 (two) times daily.     . polyethylene glycol (MIRALAX / GLYCOLAX) 17 g packet Take 17 g by mouth as needed.     . potassium chloride SA (K-DUR,KLOR-CON) 20 MEQ tablet Take 20 mEq by mouth every morning.     . rosuvastatin (CRESTOR) 40 MG tablet Take 40 mg by mouth daily.     Marland Kitchen  senna-docusate (SENOKOT-S) 8.6-50 MG tablet Take 2 tablets by mouth 2 (two) times daily as needed for mild constipation. 100 tablet 0    Scheduled: . amLODipine  10 mg Oral Daily  . apixaban  2.5 mg Oral BID  . escitalopram  10 mg Oral Daily  . feeding supplement  237 mL Oral BID BM  . isosorbide mononitrate  30 mg Oral Daily  . levothyroxine  50 mcg Oral QAC breakfast  . metoCLOPramide  5 mg Oral TID  . pantoprazole  40 mg Oral Daily  . rosuvastatin  40 mg Oral Daily    ROS:  No chest pain. Other ROS as per HPI.   Blood pressure (!) 143/65, pulse 64, temperature 98.5 F (36.9 C), temperature source Oral, resp. rate 15, height 5' (1.524 m), weight 80 kg, SpO2 96 %.   General Examination:                                                                                                       Physical Exam  HEENT-  [place]/AT  Lungs- Respirations unlabored Extremities- No edema. No rash. No Gottron's papules seen.   Neurological Examination Mental Status: Alert, fully oriented to place and time, thought content appropriate.  Speech fluent without evidence of aphasia.  Able to follow all commands without difficulty. Nd confusion noted.  Cranial Nerves: II: Visual fields intact with no extinction to DSS. PERRL.   III,IV, VI: EOMI except for subtle lagging of left eye when gazing to the left. Cannot fully bury sclera of left eye when gazing to left. No nystagmus. With sustained upgaze, there is no fatiguability. Intermittent left ptosis worsens after keeping eyelids tightly closed for 10 seconds. No double vision at midline or with eye movement. Left ptosis does not improve after cooling with an ice pack for 60 seconds.  V,VII: Smile symmetric, facial temp sensation equal bilaterally VIII: Hearing intact to voice IX,X: Palate inferior border could not be  visualized with mouth open due to obscuration by tongue.  XI: Symmetric shoulder shrug XII: Midline tongue extension Motor: BUE 4+/5 strength proximally and distally without asymmetry. No muscle wasting seen. No muscle tenderness.  BLE 4+/5 strength proximally and distally without asymmetry. No muscle wasting or tenderness.  Sensory: Pinprick and light touch intact throughout, bilaterally Deep Tendon Reflexes: 2+ and symmetric BUE. 0 bilateral patellae in the context of osteoarthritis and old left knee operation. 0 bilateral achilles.  Cerebellar: No ataxia with FNF bilaterally  Gait: Deferred   Lab Results: Basic Metabolic Panel: Recent Labs  Lab 02/27/21 1220 02/28/21 0516  NA 139 140  K 3.7 3.9  CL 103 105  CO2 26 28  GLUCOSE 111* 98  BUN 17 21  CREATININE 0.91 0.87  CALCIUM 9.4 9.2  MG  --  2.2  PHOS  --  3.9    CBC: Recent Labs  Lab 02/27/21 1220 02/28/21 0516  WBC 13.8* 13.8*  NEUTROABS 11.0*  --   HGB 14.4 12.5  HCT 43.6 37.6  MCV 89.7 89.1  PLT 298 268    Cardiac Enzymes: No results for input(s): CKTOTAL, CKMB, CKMBINDEX, TROPONINI in the last 168 hours.  Lipid Panel: Recent Labs  Lab 02/27/21 1425  CHOL 182  TRIG 146  HDL 93  CHOLHDL 2.0  VLDL 29  LDLCALC 60    Imaging: DG Chest 2 View  Result Date: 02/27/2021 CLINICAL DATA:  Weakness, altered mental status and slurred speech for 4 days. EXAM: CHEST - 2 VIEW COMPARISON:  Radiographs 09/02/2019.  CT 07/16/2019. FINDINGS: 1239 hours. The heart size and mediastinal contours are stable with aortic atherosclerosis. The lungs are clear. There is no pleural effusion or pneumothorax. Glenohumeral and thoracic  spine degenerative changes are noted. Telemetry leads overlie the chest. IMPRESSION: Stable chest.  No active cardiopulmonary process. Electronically Signed   By: Richardean Sale M.D.   On: 02/27/2021 13:13   CT Head Wo Contrast  Result Date: 02/27/2021 CLINICAL DATA:  Neuro deficit.  Altered  slurred speech for 4 days. EXAM: CT HEAD WITHOUT CONTRAST TECHNIQUE: Contiguous axial images were obtained from the base of the skull through the vertex without intravenous contrast. COMPARISON:  04/13/2020 FINDINGS: Brain: No evidence of acute infarction, hemorrhage, hydrocephalus, extra-axial collection or mass lesion/mass effect. Vascular: No hyperdense vessel or unexpected calcification. Skull: Normal. Negative for fracture or focal lesion. Sinuses/Orbits: Mastoid air cells and paranasal sinuses are clear. Other: None IMPRESSION: 1. No acute intracranial abnormality. Electronically Signed   By: Kerby Moors M.D.   On: 02/27/2021 13:23   MR BRAIN WO CONTRAST  Result Date: 02/27/2021 CLINICAL DATA:  Initial evaluation for acute altered mental status, weakness, confusion, slurred speech. EXAM: MRI HEAD WITHOUT CONTRAST TECHNIQUE: Multiplanar, multiecho pulse sequences of the brain and surrounding structures were obtained without intravenous contrast. COMPARISON:  Prior CT from earlier the same day. FINDINGS: Brain: Cerebral volume within normal limits for age. Mild hazy T2/FLAIR hyperintensity within the periventricular and deep white matter both cerebral hemispheres most consistent with chronic small vessel ischemic disease, mild in nature. Small remote lacunar infarct noted within the left thalamus. Few small foci of encephalomalacia and gliosis involving the cortical/subcortical left parieto-occipital region consistent with tiny remote ischemic infarcts (series 15, images 35, 30). No abnormal foci of restricted diffusion to suggest acute or subacute ischemia. Gray-white matter differentiation maintained. No encephalomalacia to suggest chronic cortical infarction elsewhere within the brain. No acute intracranial hemorrhage. Single punctate chronic microhemorrhage noted at the anterior left frontal lobe, of doubtful significance in isolation. No mass lesion, midline shift or mass effect. No hydrocephalus or  extra-axial fluid collection. Pituitary gland suprasellar region within normal limits. Midline structures intact. Vascular: Major intracranial vascular flow voids are maintained. Skull and upper cervical spine: Craniocervical junction within normal limits. Bone marrow signal intensity normal. No scalp soft tissue abnormality. Sinuses/Orbits: Patient status post bilateral ocular lens replacement. Globes and orbital soft tissues demonstrate no acute finding. Paranasal sinuses are largely clear. No mastoid effusion. Inner ear structures within normal limits. Other: None. IMPRESSION: 1. No acute intracranial abnormality. 2. 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 3. Underlying mild chronic microvascular ischemic disease for age. Electronically Signed   By: Jeannine Boga M.D.   On: 02/27/2021 22:55    Assessment: 80 year old female presenting with altered mental status, slurred speech and diffuse limb weakness x 4 days.  1. Exam reveals subtle lagging of left eye when gazing to the left; cannot fully bury sclera of left eye when gazing to left. Left ptosis is also noted, which does not improve after cooling with an ice pack for 60 seconds. There is no weakness of CN7 innervated muscles, no neck weakness, no evidence for diaphragmatic weakness and preserved upper extremity reflexes. Diffuse mild weakness is present, rated as 4+/5. No miosis or symptoms of anhidrosis on the left to militate strongly in favor of a Horner's syndrome, but a partial Horner's is a consideration.  2. DDx includes myasthenia gravis versus Lambert-Eaton myasthenic syndrome. DDx also includes statin-induced myopathy and partial Horner's syndrome. CIDP or AIDP are unlikely based on presenting symptoms and presence of upper extremity reflexes (absent lower extremity reflexes attributable to arthritis and  prior surgery).  3. Ammonia normal. TSH normal.  4. MRI brain: No acute  intracranial abnormality. A few small remote ischemic infarcts involving the left parieto-occipital region and left thalamus are seen on a background of underlying mild chronic microvascular ischemic changes.   Recommendations: 1. CPAP for OSA 2. On synthroid for her hypothyroidism. 3. Has been restarted on apixaban for her PAF 4. Stopping rosuvastatin 5. Obtain B12 level.  6. Myasthenia gravis antibody panel and anti-voltage-gated calcium channel antibody for possible LEMS.  7. Outpatient Neurology follow ups to assess for possible.  8. MRI of cervical spine to assess for possible sympathetic chain lesion on the left. Will need to discuss with Radiology the optimal protocol for this tomorrow.  9. CK level.    Electronically signed: Dr. Kerney Elbe 02/28/2021, 10:14 AM

## 2021-03-01 DIAGNOSIS — I10 Essential (primary) hypertension: Secondary | ICD-10-CM | POA: Diagnosis not present

## 2021-03-01 DIAGNOSIS — G4733 Obstructive sleep apnea (adult) (pediatric): Secondary | ICD-10-CM | POA: Diagnosis not present

## 2021-03-01 NOTE — Progress Notes (Signed)
Pt d/c to home via friend. IV removed intact. VSS. Education completed. Pt gown, socks, phone and charger sent home with pt. All questions answered.

## 2021-03-01 NOTE — Discharge Summary (Signed)
Physician Discharge Summary  Patient ID: Erin Sutton MRN: [id number] DOB/AGE: 1941/06/15 80 y.o.  Admit date: 02/27/2021 Discharge date: 03/01/2021  Admission Diagnoses:  Discharge Diagnoses:  Active Problems:   Confusion   Hypothyroidism   HTN (hypertension)   GERD (gastroesophageal reflux disease)   Anxiety   Accelerated hypertension   Hyperlipidemia   Sleep apnea   Discharged Condition: good  Hospital Course:  Please see yesterday's discharge summary for hospital course. Patient was discharged yesterday, patient did not want to go because she has some headache.  Currently, she feels well, headache has been better.  She had a normal looking bowel movement this morning.  She is still medically stable to be discharged.  Consults: neurology  Significant Diagnostic Studies: see yesterday note  Treatments: stroke workup  Discharge Exam: Blood pressure (!) 162/86, pulse 74, temperature 98.7 F (37.1 C), resp. rate 16, height 5' (1.524 m), weight 80 kg, SpO2 97 %. General appearance: alert and cooperative Resp: clear to auscultation bilaterally Cardio: regular rate and rhythm, S1, S2 normal, no murmur, click, rub or gallop GI: soft, non-tender; bowel sounds normal; no masses,  no organomegaly Extremities: extremities normal, atraumatic, no cyanosis or edema  Disposition: Discharge disposition: 01-Home or Self Care       Discharge Instructions    Diet - low sodium heart healthy   Complete by: As directed    Diet - low sodium heart healthy   Complete by: As directed    Increase activity slowly   Complete by: As directed    Increase activity slowly   Complete by: As directed      Allergies as of 03/01/2021      Reactions   Ace Inhibitors Cough   Levofloxacin    Penicillin G Other (See Comments)   Tape Other (See Comments)   Other reaction(s): Other (See Comments)   Amoxicillin Rash   Has patient had a PCN reaction causing immediate rash, facial/tongue/throat  swelling, SOB or lightheadedness with hypotension: No Has patient had a PCN reaction causing severe rash involving mucus membranes or skin necrosis: No Has patient had a PCN reaction that required hospitalization: No Has patient had a PCN reaction occurring within the last 10 years: No If all of the above answers are "NO", then may proceed with Cephalosporin use.   Benicar [olmesartan] Rash   Itching rash   Codeine Rash   Codeine Sulfate Rash   Levaquin [levofloxacin In D5w] Rash   Alters mental status   Penicillin V Potassium Rash   Has patient had a PCN reaction causing immediate rash, facial/tongue/throat swelling, SOB or lightheadedness with hypotension: No Has patient had a PCN reaction causing severe rash involving mucus membranes or skin necrosis: No Has patient had a PCN reaction that required hospitalization: No Has patient had a PCN reaction occurring within the last 10 years: No If all of the above answers are "NO", then may proceed with Cephalosporin use.   Sulfa Antibiotics Rash      Medication List    TAKE these medications   amLODipine 10 MG tablet Commonly known as: NORVASC Take 10 mg by mouth daily.   apixaban 2.5 MG Tabs tablet Commonly known as: Eliquis Take 1 tablet (2.5 mg total) by mouth 2 (two) times daily.   atorvastatin 40 MG tablet Commonly known as: LIPITOR Take 1 tablet by mouth daily.   carvedilol 3.125 MG tablet Commonly known as: COREG Take 3.125 mg by mouth 2 (two) times daily.   isosorbide mononitrate  30 MG 24 hr tablet Commonly known as: IMDUR Take 30 mg by mouth daily.   levothyroxine 50 MCG tablet Commonly known as: SYNTHROID Take 50 mcg by mouth daily before breakfast.   LORazepam 0.5 MG tablet Commonly known as: ATIVAN Take 0.5 mg by mouth 3 (three) times daily as needed.   metoCLOPramide 5 MG tablet Commonly known as: Reglan Take 1 tablet (5 mg total) by mouth 3 (three) times daily for 3 days.   pantoprazole 40 MG  tablet Commonly known as: PROTONIX Take 40 mg by mouth daily.   potassium chloride SA 20 MEQ tablet Commonly known as: KLOR-CON Take 20 mEq by mouth every morning.   senna-docusate 8.6-50 MG tablet Commonly known as: Senokot-S Take 2 tablets by mouth 2 (two) times daily as needed for mild constipation.   SYSTANE OP Place 1 drop into both eyes 2 (two) times daily.       Follow-up Information    Baxter Hire, MD Follow up in 1 week(s).   Specialty: Internal Medicine Contact information: [place] [address] [phone]        [facility] Follow up in 2 week(s).   Contact information: [place] [phone]              Signed: Sharen Hones 03/01/2021, 9:08 AM

## 2021-03-05 LAB — ACETYLCHOLINE RECEPTOR AB, ALL
Acety choline binding ab: <0.03 nmol/L (ref 0.00–0.24)
Acetylchol Block Ab: 13 % (ref 0–25)

## 2021-03-05 LAB — METHYLMALONIC ACID, SERUM: Methylmalonic Acid, Quantitative: 168 nmol/L (ref 0–378)

## 2021-03-07 DIAGNOSIS — R410 Disorientation, unspecified: Secondary | ICD-10-CM | POA: Diagnosis not present

## 2021-03-07 DIAGNOSIS — G4733 Obstructive sleep apnea (adult) (pediatric): Secondary | ICD-10-CM | POA: Diagnosis not present

## 2021-03-07 DIAGNOSIS — Z09 Encounter for follow-up examination after completed treatment for conditions other than malignant neoplasm: Secondary | ICD-10-CM | POA: Diagnosis not present

## 2021-03-07 LAB — MISC LABCORP TEST (SEND OUT): Labcorp test code: 140640

## 2021-03-08 DIAGNOSIS — G4733 Obstructive sleep apnea (adult) (pediatric): Secondary | ICD-10-CM | POA: Diagnosis not present

## 2021-03-12 DIAGNOSIS — R600 Localized edema: Secondary | ICD-10-CM | POA: Diagnosis not present

## 2021-03-12 DIAGNOSIS — J302 Other seasonal allergic rhinitis: Secondary | ICD-10-CM | POA: Diagnosis not present

## 2021-03-17 DIAGNOSIS — E785 Hyperlipidemia, unspecified: Secondary | ICD-10-CM | POA: Diagnosis not present

## 2021-03-17 DIAGNOSIS — Z86718 Personal history of other venous thrombosis and embolism: Secondary | ICD-10-CM | POA: Diagnosis not present

## 2021-03-17 DIAGNOSIS — E039 Hypothyroidism, unspecified: Secondary | ICD-10-CM | POA: Diagnosis not present

## 2021-03-17 DIAGNOSIS — I10 Essential (primary) hypertension: Secondary | ICD-10-CM | POA: Diagnosis not present

## 2021-03-17 DIAGNOSIS — Z86711 Personal history of pulmonary embolism: Secondary | ICD-10-CM | POA: Diagnosis not present

## 2021-03-17 DIAGNOSIS — R42 Dizziness and giddiness: Secondary | ICD-10-CM | POA: Diagnosis not present

## 2021-03-17 DIAGNOSIS — R4182 Altered mental status, unspecified: Secondary | ICD-10-CM | POA: Diagnosis not present

## 2021-03-17 DIAGNOSIS — Z20822 Contact with and (suspected) exposure to covid-19: Secondary | ICD-10-CM | POA: Diagnosis not present

## 2021-03-17 DIAGNOSIS — R0789 Other chest pain: Secondary | ICD-10-CM | POA: Diagnosis not present

## 2021-03-17 DIAGNOSIS — R110 Nausea: Secondary | ICD-10-CM | POA: Diagnosis not present

## 2021-03-17 DIAGNOSIS — E212 Other hyperparathyroidism: Secondary | ICD-10-CM | POA: Diagnosis not present

## 2021-03-17 DIAGNOSIS — F0390 Unspecified dementia without behavioral disturbance: Secondary | ICD-10-CM | POA: Diagnosis not present

## 2021-03-17 DIAGNOSIS — R531 Weakness: Secondary | ICD-10-CM | POA: Diagnosis not present

## 2021-03-17 DIAGNOSIS — R1084 Generalized abdominal pain: Secondary | ICD-10-CM | POA: Diagnosis not present

## 2021-03-17 DIAGNOSIS — R4781 Slurred speech: Secondary | ICD-10-CM | POA: Diagnosis not present

## 2021-03-17 DIAGNOSIS — E876 Hypokalemia: Secondary | ICD-10-CM | POA: Diagnosis not present

## 2021-03-17 DIAGNOSIS — R0602 Shortness of breath: Secondary | ICD-10-CM | POA: Diagnosis not present

## 2021-03-17 DIAGNOSIS — K5730 Diverticulosis of large intestine without perforation or abscess without bleeding: Secondary | ICD-10-CM | POA: Diagnosis not present

## 2021-03-17 DIAGNOSIS — E213 Hyperparathyroidism, unspecified: Secondary | ICD-10-CM | POA: Diagnosis not present

## 2021-03-17 DIAGNOSIS — F413 Other mixed anxiety disorders: Secondary | ICD-10-CM | POA: Diagnosis not present

## 2021-03-17 DIAGNOSIS — M1990 Unspecified osteoarthritis, unspecified site: Secondary | ICD-10-CM | POA: Diagnosis not present

## 2021-03-17 DIAGNOSIS — I480 Paroxysmal atrial fibrillation: Secondary | ICD-10-CM | POA: Diagnosis not present

## 2021-03-17 DIAGNOSIS — F419 Anxiety disorder, unspecified: Secondary | ICD-10-CM | POA: Diagnosis not present

## 2021-03-17 DIAGNOSIS — J9811 Atelectasis: Secondary | ICD-10-CM | POA: Diagnosis not present

## 2021-03-17 DIAGNOSIS — I674 Hypertensive encephalopathy: Secondary | ICD-10-CM | POA: Diagnosis not present

## 2021-03-17 DIAGNOSIS — R5383 Other fatigue: Secondary | ICD-10-CM | POA: Diagnosis not present

## 2021-03-17 DIAGNOSIS — R0902 Hypoxemia: Secondary | ICD-10-CM | POA: Diagnosis not present

## 2021-03-26 DIAGNOSIS — F419 Anxiety disorder, unspecified: Secondary | ICD-10-CM | POA: Diagnosis not present

## 2021-03-26 DIAGNOSIS — M1711 Unilateral primary osteoarthritis, right knee: Secondary | ICD-10-CM | POA: Diagnosis not present

## 2021-03-26 DIAGNOSIS — I82402 Acute embolism and thrombosis of unspecified deep veins of left lower extremity: Secondary | ICD-10-CM | POA: Diagnosis not present

## 2021-03-26 DIAGNOSIS — I129 Hypertensive chronic kidney disease with stage 1 through stage 4 chronic kidney disease, or unspecified chronic kidney disease: Secondary | ICD-10-CM | POA: Diagnosis not present

## 2021-03-26 DIAGNOSIS — G8929 Other chronic pain: Secondary | ICD-10-CM | POA: Diagnosis not present

## 2021-03-26 DIAGNOSIS — E890 Postprocedural hypothyroidism: Secondary | ICD-10-CM | POA: Diagnosis not present

## 2021-03-26 DIAGNOSIS — I480 Paroxysmal atrial fibrillation: Secondary | ICD-10-CM | POA: Diagnosis not present

## 2021-03-26 DIAGNOSIS — F0390 Unspecified dementia without behavioral disturbance: Secondary | ICD-10-CM | POA: Diagnosis not present

## 2021-03-26 DIAGNOSIS — N189 Chronic kidney disease, unspecified: Secondary | ICD-10-CM | POA: Diagnosis not present

## 2021-03-27 DIAGNOSIS — E890 Postprocedural hypothyroidism: Secondary | ICD-10-CM | POA: Diagnosis not present

## 2021-03-27 DIAGNOSIS — I82402 Acute embolism and thrombosis of unspecified deep veins of left lower extremity: Secondary | ICD-10-CM | POA: Diagnosis not present

## 2021-03-27 DIAGNOSIS — G8929 Other chronic pain: Secondary | ICD-10-CM | POA: Diagnosis not present

## 2021-03-27 DIAGNOSIS — F0390 Unspecified dementia without behavioral disturbance: Secondary | ICD-10-CM | POA: Diagnosis not present

## 2021-03-27 DIAGNOSIS — Z09 Encounter for follow-up examination after completed treatment for conditions other than malignant neoplasm: Secondary | ICD-10-CM | POA: Diagnosis not present

## 2021-03-27 DIAGNOSIS — I480 Paroxysmal atrial fibrillation: Secondary | ICD-10-CM | POA: Diagnosis not present

## 2021-03-27 DIAGNOSIS — I129 Hypertensive chronic kidney disease with stage 1 through stage 4 chronic kidney disease, or unspecified chronic kidney disease: Secondary | ICD-10-CM | POA: Diagnosis not present

## 2021-03-27 DIAGNOSIS — F419 Anxiety disorder, unspecified: Secondary | ICD-10-CM | POA: Diagnosis not present

## 2021-03-27 DIAGNOSIS — N189 Chronic kidney disease, unspecified: Secondary | ICD-10-CM | POA: Diagnosis not present

## 2021-03-27 DIAGNOSIS — M1711 Unilateral primary osteoarthritis, right knee: Secondary | ICD-10-CM | POA: Diagnosis not present

## 2021-03-27 DIAGNOSIS — I10 Essential (primary) hypertension: Secondary | ICD-10-CM | POA: Diagnosis not present

## 2021-03-29 DIAGNOSIS — I82402 Acute embolism and thrombosis of unspecified deep veins of left lower extremity: Secondary | ICD-10-CM | POA: Diagnosis not present

## 2021-03-29 DIAGNOSIS — G8929 Other chronic pain: Secondary | ICD-10-CM | POA: Diagnosis not present

## 2021-03-29 DIAGNOSIS — R110 Nausea: Secondary | ICD-10-CM | POA: Diagnosis not present

## 2021-03-29 DIAGNOSIS — I129 Hypertensive chronic kidney disease with stage 1 through stage 4 chronic kidney disease, or unspecified chronic kidney disease: Secondary | ICD-10-CM | POA: Diagnosis not present

## 2021-03-29 DIAGNOSIS — N189 Chronic kidney disease, unspecified: Secondary | ICD-10-CM | POA: Diagnosis not present

## 2021-03-29 DIAGNOSIS — F419 Anxiety disorder, unspecified: Secondary | ICD-10-CM | POA: Diagnosis not present

## 2021-03-29 DIAGNOSIS — F0390 Unspecified dementia without behavioral disturbance: Secondary | ICD-10-CM | POA: Diagnosis not present

## 2021-03-29 DIAGNOSIS — E890 Postprocedural hypothyroidism: Secondary | ICD-10-CM | POA: Diagnosis not present

## 2021-03-29 DIAGNOSIS — I10 Essential (primary) hypertension: Secondary | ICD-10-CM | POA: Diagnosis not present

## 2021-03-29 DIAGNOSIS — M1711 Unilateral primary osteoarthritis, right knee: Secondary | ICD-10-CM | POA: Diagnosis not present

## 2021-03-29 DIAGNOSIS — I480 Paroxysmal atrial fibrillation: Secondary | ICD-10-CM | POA: Diagnosis not present

## 2021-04-01 DIAGNOSIS — I480 Paroxysmal atrial fibrillation: Secondary | ICD-10-CM | POA: Diagnosis not present

## 2021-04-01 DIAGNOSIS — M1711 Unilateral primary osteoarthritis, right knee: Secondary | ICD-10-CM | POA: Diagnosis not present

## 2021-04-01 DIAGNOSIS — I129 Hypertensive chronic kidney disease with stage 1 through stage 4 chronic kidney disease, or unspecified chronic kidney disease: Secondary | ICD-10-CM | POA: Diagnosis not present

## 2021-04-01 DIAGNOSIS — F0390 Unspecified dementia without behavioral disturbance: Secondary | ICD-10-CM | POA: Diagnosis not present

## 2021-04-01 DIAGNOSIS — E890 Postprocedural hypothyroidism: Secondary | ICD-10-CM | POA: Diagnosis not present

## 2021-04-01 DIAGNOSIS — G8929 Other chronic pain: Secondary | ICD-10-CM | POA: Diagnosis not present

## 2021-04-01 DIAGNOSIS — I82402 Acute embolism and thrombosis of unspecified deep veins of left lower extremity: Secondary | ICD-10-CM | POA: Diagnosis not present

## 2021-04-01 DIAGNOSIS — F419 Anxiety disorder, unspecified: Secondary | ICD-10-CM | POA: Diagnosis not present

## 2021-04-01 DIAGNOSIS — N189 Chronic kidney disease, unspecified: Secondary | ICD-10-CM | POA: Diagnosis not present

## 2021-04-02 DIAGNOSIS — I480 Paroxysmal atrial fibrillation: Secondary | ICD-10-CM | POA: Diagnosis not present

## 2021-04-02 DIAGNOSIS — E890 Postprocedural hypothyroidism: Secondary | ICD-10-CM | POA: Diagnosis not present

## 2021-04-02 DIAGNOSIS — I129 Hypertensive chronic kidney disease with stage 1 through stage 4 chronic kidney disease, or unspecified chronic kidney disease: Secondary | ICD-10-CM | POA: Diagnosis not present

## 2021-04-02 DIAGNOSIS — M1711 Unilateral primary osteoarthritis, right knee: Secondary | ICD-10-CM | POA: Diagnosis not present

## 2021-04-02 DIAGNOSIS — F0390 Unspecified dementia without behavioral disturbance: Secondary | ICD-10-CM | POA: Diagnosis not present

## 2021-04-02 DIAGNOSIS — N189 Chronic kidney disease, unspecified: Secondary | ICD-10-CM | POA: Diagnosis not present

## 2021-04-02 DIAGNOSIS — G8929 Other chronic pain: Secondary | ICD-10-CM | POA: Diagnosis not present

## 2021-04-02 DIAGNOSIS — I82402 Acute embolism and thrombosis of unspecified deep veins of left lower extremity: Secondary | ICD-10-CM | POA: Diagnosis not present

## 2021-04-02 DIAGNOSIS — F419 Anxiety disorder, unspecified: Secondary | ICD-10-CM | POA: Diagnosis not present

## 2021-04-03 DIAGNOSIS — M1711 Unilateral primary osteoarthritis, right knee: Secondary | ICD-10-CM | POA: Diagnosis not present

## 2021-04-03 DIAGNOSIS — F419 Anxiety disorder, unspecified: Secondary | ICD-10-CM | POA: Diagnosis not present

## 2021-04-03 DIAGNOSIS — G8929 Other chronic pain: Secondary | ICD-10-CM | POA: Diagnosis not present

## 2021-04-03 DIAGNOSIS — F0390 Unspecified dementia without behavioral disturbance: Secondary | ICD-10-CM | POA: Diagnosis not present

## 2021-04-03 DIAGNOSIS — I82402 Acute embolism and thrombosis of unspecified deep veins of left lower extremity: Secondary | ICD-10-CM | POA: Diagnosis not present

## 2021-04-03 DIAGNOSIS — E890 Postprocedural hypothyroidism: Secondary | ICD-10-CM | POA: Diagnosis not present

## 2021-04-03 DIAGNOSIS — N189 Chronic kidney disease, unspecified: Secondary | ICD-10-CM | POA: Diagnosis not present

## 2021-04-03 DIAGNOSIS — I480 Paroxysmal atrial fibrillation: Secondary | ICD-10-CM | POA: Diagnosis not present

## 2021-04-03 DIAGNOSIS — I129 Hypertensive chronic kidney disease with stage 1 through stage 4 chronic kidney disease, or unspecified chronic kidney disease: Secondary | ICD-10-CM | POA: Diagnosis not present

## 2021-04-04 DIAGNOSIS — F0390 Unspecified dementia without behavioral disturbance: Secondary | ICD-10-CM | POA: Diagnosis not present

## 2021-04-04 DIAGNOSIS — N189 Chronic kidney disease, unspecified: Secondary | ICD-10-CM | POA: Diagnosis not present

## 2021-04-04 DIAGNOSIS — I129 Hypertensive chronic kidney disease with stage 1 through stage 4 chronic kidney disease, or unspecified chronic kidney disease: Secondary | ICD-10-CM | POA: Diagnosis not present

## 2021-04-04 DIAGNOSIS — F419 Anxiety disorder, unspecified: Secondary | ICD-10-CM | POA: Diagnosis not present

## 2021-04-04 DIAGNOSIS — E890 Postprocedural hypothyroidism: Secondary | ICD-10-CM | POA: Diagnosis not present

## 2021-04-04 DIAGNOSIS — G8929 Other chronic pain: Secondary | ICD-10-CM | POA: Diagnosis not present

## 2021-04-04 DIAGNOSIS — M1711 Unilateral primary osteoarthritis, right knee: Secondary | ICD-10-CM | POA: Diagnosis not present

## 2021-04-04 DIAGNOSIS — I480 Paroxysmal atrial fibrillation: Secondary | ICD-10-CM | POA: Diagnosis not present

## 2021-04-04 DIAGNOSIS — I82402 Acute embolism and thrombosis of unspecified deep veins of left lower extremity: Secondary | ICD-10-CM | POA: Diagnosis not present

## 2021-04-05 DIAGNOSIS — M1711 Unilateral primary osteoarthritis, right knee: Secondary | ICD-10-CM | POA: Diagnosis not present

## 2021-04-07 DIAGNOSIS — G4733 Obstructive sleep apnea (adult) (pediatric): Secondary | ICD-10-CM | POA: Diagnosis not present

## 2021-04-08 DIAGNOSIS — F419 Anxiety disorder, unspecified: Secondary | ICD-10-CM | POA: Diagnosis not present

## 2021-04-08 DIAGNOSIS — I480 Paroxysmal atrial fibrillation: Secondary | ICD-10-CM | POA: Diagnosis not present

## 2021-04-08 DIAGNOSIS — G8929 Other chronic pain: Secondary | ICD-10-CM | POA: Diagnosis not present

## 2021-04-08 DIAGNOSIS — I82402 Acute embolism and thrombosis of unspecified deep veins of left lower extremity: Secondary | ICD-10-CM | POA: Diagnosis not present

## 2021-04-08 DIAGNOSIS — M1711 Unilateral primary osteoarthritis, right knee: Secondary | ICD-10-CM | POA: Diagnosis not present

## 2021-04-08 DIAGNOSIS — E890 Postprocedural hypothyroidism: Secondary | ICD-10-CM | POA: Diagnosis not present

## 2021-04-08 DIAGNOSIS — I129 Hypertensive chronic kidney disease with stage 1 through stage 4 chronic kidney disease, or unspecified chronic kidney disease: Secondary | ICD-10-CM | POA: Diagnosis not present

## 2021-04-08 DIAGNOSIS — F0390 Unspecified dementia without behavioral disturbance: Secondary | ICD-10-CM | POA: Diagnosis not present

## 2021-04-08 DIAGNOSIS — N189 Chronic kidney disease, unspecified: Secondary | ICD-10-CM | POA: Diagnosis not present

## 2021-04-09 DIAGNOSIS — I82402 Acute embolism and thrombosis of unspecified deep veins of left lower extremity: Secondary | ICD-10-CM | POA: Diagnosis not present

## 2021-04-09 DIAGNOSIS — F0390 Unspecified dementia without behavioral disturbance: Secondary | ICD-10-CM | POA: Diagnosis not present

## 2021-04-09 DIAGNOSIS — F419 Anxiety disorder, unspecified: Secondary | ICD-10-CM | POA: Diagnosis not present

## 2021-04-09 DIAGNOSIS — N189 Chronic kidney disease, unspecified: Secondary | ICD-10-CM | POA: Diagnosis not present

## 2021-04-09 DIAGNOSIS — E890 Postprocedural hypothyroidism: Secondary | ICD-10-CM | POA: Diagnosis not present

## 2021-04-09 DIAGNOSIS — M1711 Unilateral primary osteoarthritis, right knee: Secondary | ICD-10-CM | POA: Diagnosis not present

## 2021-04-09 DIAGNOSIS — I129 Hypertensive chronic kidney disease with stage 1 through stage 4 chronic kidney disease, or unspecified chronic kidney disease: Secondary | ICD-10-CM | POA: Diagnosis not present

## 2021-04-09 DIAGNOSIS — I480 Paroxysmal atrial fibrillation: Secondary | ICD-10-CM | POA: Diagnosis not present

## 2021-04-09 DIAGNOSIS — G8929 Other chronic pain: Secondary | ICD-10-CM | POA: Diagnosis not present

## 2021-04-12 DIAGNOSIS — E890 Postprocedural hypothyroidism: Secondary | ICD-10-CM | POA: Diagnosis not present

## 2021-04-12 DIAGNOSIS — M1711 Unilateral primary osteoarthritis, right knee: Secondary | ICD-10-CM | POA: Diagnosis not present

## 2021-04-12 DIAGNOSIS — I82402 Acute embolism and thrombosis of unspecified deep veins of left lower extremity: Secondary | ICD-10-CM | POA: Diagnosis not present

## 2021-04-12 DIAGNOSIS — I480 Paroxysmal atrial fibrillation: Secondary | ICD-10-CM | POA: Diagnosis not present

## 2021-04-12 DIAGNOSIS — F0390 Unspecified dementia without behavioral disturbance: Secondary | ICD-10-CM | POA: Diagnosis not present

## 2021-04-12 DIAGNOSIS — N189 Chronic kidney disease, unspecified: Secondary | ICD-10-CM | POA: Diagnosis not present

## 2021-04-12 DIAGNOSIS — F419 Anxiety disorder, unspecified: Secondary | ICD-10-CM | POA: Diagnosis not present

## 2021-04-12 DIAGNOSIS — I129 Hypertensive chronic kidney disease with stage 1 through stage 4 chronic kidney disease, or unspecified chronic kidney disease: Secondary | ICD-10-CM | POA: Diagnosis not present

## 2021-04-12 DIAGNOSIS — G8929 Other chronic pain: Secondary | ICD-10-CM | POA: Diagnosis not present

## 2021-04-15 DIAGNOSIS — N189 Chronic kidney disease, unspecified: Secondary | ICD-10-CM | POA: Diagnosis not present

## 2021-04-15 DIAGNOSIS — F419 Anxiety disorder, unspecified: Secondary | ICD-10-CM | POA: Diagnosis not present

## 2021-04-15 DIAGNOSIS — F0390 Unspecified dementia without behavioral disturbance: Secondary | ICD-10-CM | POA: Diagnosis not present

## 2021-04-15 DIAGNOSIS — I480 Paroxysmal atrial fibrillation: Secondary | ICD-10-CM | POA: Diagnosis not present

## 2021-04-15 DIAGNOSIS — G8929 Other chronic pain: Secondary | ICD-10-CM | POA: Diagnosis not present

## 2021-04-15 DIAGNOSIS — I129 Hypertensive chronic kidney disease with stage 1 through stage 4 chronic kidney disease, or unspecified chronic kidney disease: Secondary | ICD-10-CM | POA: Diagnosis not present

## 2021-04-15 DIAGNOSIS — M1711 Unilateral primary osteoarthritis, right knee: Secondary | ICD-10-CM | POA: Diagnosis not present

## 2021-04-16 DIAGNOSIS — E890 Postprocedural hypothyroidism: Secondary | ICD-10-CM | POA: Diagnosis not present

## 2021-04-16 DIAGNOSIS — I129 Hypertensive chronic kidney disease with stage 1 through stage 4 chronic kidney disease, or unspecified chronic kidney disease: Secondary | ICD-10-CM | POA: Diagnosis not present

## 2021-04-16 DIAGNOSIS — F419 Anxiety disorder, unspecified: Secondary | ICD-10-CM | POA: Diagnosis not present

## 2021-04-16 DIAGNOSIS — M1711 Unilateral primary osteoarthritis, right knee: Secondary | ICD-10-CM | POA: Diagnosis not present

## 2021-04-16 DIAGNOSIS — N189 Chronic kidney disease, unspecified: Secondary | ICD-10-CM | POA: Diagnosis not present

## 2021-04-16 DIAGNOSIS — I82402 Acute embolism and thrombosis of unspecified deep veins of left lower extremity: Secondary | ICD-10-CM | POA: Diagnosis not present

## 2021-04-16 DIAGNOSIS — G8929 Other chronic pain: Secondary | ICD-10-CM | POA: Diagnosis not present

## 2021-04-16 DIAGNOSIS — I480 Paroxysmal atrial fibrillation: Secondary | ICD-10-CM | POA: Diagnosis not present

## 2021-04-16 DIAGNOSIS — F0390 Unspecified dementia without behavioral disturbance: Secondary | ICD-10-CM | POA: Diagnosis not present

## 2021-04-18 DIAGNOSIS — G8929 Other chronic pain: Secondary | ICD-10-CM | POA: Diagnosis not present

## 2021-04-18 DIAGNOSIS — I480 Paroxysmal atrial fibrillation: Secondary | ICD-10-CM | POA: Diagnosis not present

## 2021-04-18 DIAGNOSIS — N189 Chronic kidney disease, unspecified: Secondary | ICD-10-CM | POA: Diagnosis not present

## 2021-04-18 DIAGNOSIS — E890 Postprocedural hypothyroidism: Secondary | ICD-10-CM | POA: Diagnosis not present

## 2021-04-18 DIAGNOSIS — F0390 Unspecified dementia without behavioral disturbance: Secondary | ICD-10-CM | POA: Diagnosis not present

## 2021-04-18 DIAGNOSIS — I129 Hypertensive chronic kidney disease with stage 1 through stage 4 chronic kidney disease, or unspecified chronic kidney disease: Secondary | ICD-10-CM | POA: Diagnosis not present

## 2021-04-18 DIAGNOSIS — M1711 Unilateral primary osteoarthritis, right knee: Secondary | ICD-10-CM | POA: Diagnosis not present

## 2021-04-18 DIAGNOSIS — F419 Anxiety disorder, unspecified: Secondary | ICD-10-CM | POA: Diagnosis not present

## 2021-04-18 DIAGNOSIS — I82402 Acute embolism and thrombosis of unspecified deep veins of left lower extremity: Secondary | ICD-10-CM | POA: Diagnosis not present

## 2021-04-19 DIAGNOSIS — G8929 Other chronic pain: Secondary | ICD-10-CM | POA: Diagnosis not present

## 2021-04-19 DIAGNOSIS — M1711 Unilateral primary osteoarthritis, right knee: Secondary | ICD-10-CM | POA: Diagnosis not present

## 2021-04-19 DIAGNOSIS — F419 Anxiety disorder, unspecified: Secondary | ICD-10-CM | POA: Diagnosis not present

## 2021-04-19 DIAGNOSIS — I480 Paroxysmal atrial fibrillation: Secondary | ICD-10-CM | POA: Diagnosis not present

## 2021-04-19 DIAGNOSIS — N189 Chronic kidney disease, unspecified: Secondary | ICD-10-CM | POA: Diagnosis not present

## 2021-04-19 DIAGNOSIS — I129 Hypertensive chronic kidney disease with stage 1 through stage 4 chronic kidney disease, or unspecified chronic kidney disease: Secondary | ICD-10-CM | POA: Diagnosis not present

## 2021-04-19 DIAGNOSIS — F0390 Unspecified dementia without behavioral disturbance: Secondary | ICD-10-CM | POA: Diagnosis not present

## 2021-04-23 DIAGNOSIS — I82402 Acute embolism and thrombosis of unspecified deep veins of left lower extremity: Secondary | ICD-10-CM | POA: Diagnosis not present

## 2021-04-23 DIAGNOSIS — E890 Postprocedural hypothyroidism: Secondary | ICD-10-CM | POA: Diagnosis not present

## 2021-04-23 DIAGNOSIS — F419 Anxiety disorder, unspecified: Secondary | ICD-10-CM | POA: Diagnosis not present

## 2021-04-23 DIAGNOSIS — G8929 Other chronic pain: Secondary | ICD-10-CM | POA: Diagnosis not present

## 2021-04-23 DIAGNOSIS — F0390 Unspecified dementia without behavioral disturbance: Secondary | ICD-10-CM | POA: Diagnosis not present

## 2021-04-23 DIAGNOSIS — M1711 Unilateral primary osteoarthritis, right knee: Secondary | ICD-10-CM | POA: Diagnosis not present

## 2021-04-23 DIAGNOSIS — I129 Hypertensive chronic kidney disease with stage 1 through stage 4 chronic kidney disease, or unspecified chronic kidney disease: Secondary | ICD-10-CM | POA: Diagnosis not present

## 2021-04-23 DIAGNOSIS — N189 Chronic kidney disease, unspecified: Secondary | ICD-10-CM | POA: Diagnosis not present

## 2021-04-23 DIAGNOSIS — I480 Paroxysmal atrial fibrillation: Secondary | ICD-10-CM | POA: Diagnosis not present

## 2021-04-29 DIAGNOSIS — F321 Major depressive disorder, single episode, moderate: Secondary | ICD-10-CM | POA: Diagnosis not present

## 2021-04-29 DIAGNOSIS — E538 Deficiency of other specified B group vitamins: Secondary | ICD-10-CM | POA: Diagnosis not present

## 2021-05-06 DIAGNOSIS — E538 Deficiency of other specified B group vitamins: Secondary | ICD-10-CM | POA: Diagnosis not present

## 2021-05-06 DIAGNOSIS — M1711 Unilateral primary osteoarthritis, right knee: Secondary | ICD-10-CM | POA: Diagnosis not present

## 2021-05-10 DIAGNOSIS — N189 Chronic kidney disease, unspecified: Secondary | ICD-10-CM | POA: Diagnosis not present

## 2021-05-10 DIAGNOSIS — I129 Hypertensive chronic kidney disease with stage 1 through stage 4 chronic kidney disease, or unspecified chronic kidney disease: Secondary | ICD-10-CM | POA: Diagnosis not present

## 2021-05-10 DIAGNOSIS — I82402 Acute embolism and thrombosis of unspecified deep veins of left lower extremity: Secondary | ICD-10-CM | POA: Diagnosis not present

## 2021-05-10 DIAGNOSIS — M1711 Unilateral primary osteoarthritis, right knee: Secondary | ICD-10-CM | POA: Diagnosis not present

## 2021-05-10 DIAGNOSIS — F0390 Unspecified dementia without behavioral disturbance: Secondary | ICD-10-CM | POA: Diagnosis not present

## 2021-05-10 DIAGNOSIS — F419 Anxiety disorder, unspecified: Secondary | ICD-10-CM | POA: Diagnosis not present

## 2021-05-10 DIAGNOSIS — I480 Paroxysmal atrial fibrillation: Secondary | ICD-10-CM | POA: Diagnosis not present

## 2021-05-10 DIAGNOSIS — G8929 Other chronic pain: Secondary | ICD-10-CM | POA: Diagnosis not present

## 2021-05-10 DIAGNOSIS — E890 Postprocedural hypothyroidism: Secondary | ICD-10-CM | POA: Diagnosis not present

## 2021-05-12 ENCOUNTER — Other Ambulatory Visit: Payer: Self-pay

## 2021-05-12 ENCOUNTER — Emergency Department: Payer: Medicare HMO

## 2021-05-12 ENCOUNTER — Inpatient Hospital Stay
Admission: EM | Admit: 2021-05-12 | Discharge: 2021-05-20 | DRG: 392 | Disposition: A | Discharge status: Home Health | Payer: Medicare HMO | Attending: Internal Medicine | Admitting: Internal Medicine

## 2021-05-12 ENCOUNTER — Encounter: Payer: Self-pay | Admitting: Emergency Medicine

## 2021-05-12 DIAGNOSIS — Z7901 Long term (current) use of anticoagulants: Secondary | ICD-10-CM

## 2021-05-12 DIAGNOSIS — I160 Hypertensive urgency: Secondary | ICD-10-CM

## 2021-05-12 DIAGNOSIS — F411 Generalized anxiety disorder: Secondary | ICD-10-CM | POA: Diagnosis not present

## 2021-05-12 DIAGNOSIS — Z9842 Cataract extraction status, left eye: Secondary | ICD-10-CM | POA: Diagnosis not present

## 2021-05-12 DIAGNOSIS — F410 Panic disorder [episodic paroxysmal anxiety] without agoraphobia: Secondary | ICD-10-CM | POA: Diagnosis present

## 2021-05-12 DIAGNOSIS — I5032 Chronic diastolic (congestive) heart failure: Secondary | ICD-10-CM | POA: Diagnosis present

## 2021-05-12 DIAGNOSIS — Z86711 Personal history of pulmonary embolism: Secondary | ICD-10-CM

## 2021-05-12 DIAGNOSIS — Z20822 Contact with and (suspected) exposure to covid-19: Secondary | ICD-10-CM | POA: Diagnosis present

## 2021-05-12 DIAGNOSIS — Z96653 Presence of artificial knee joint, bilateral: Secondary | ICD-10-CM | POA: Diagnosis present

## 2021-05-12 DIAGNOSIS — Z6831 Body mass index (BMI) 31.0-31.9, adult: Secondary | ICD-10-CM

## 2021-05-12 DIAGNOSIS — R079 Chest pain, unspecified: Secondary | ICD-10-CM | POA: Diagnosis not present

## 2021-05-12 DIAGNOSIS — R109 Unspecified abdominal pain: Secondary | ICD-10-CM

## 2021-05-12 DIAGNOSIS — R630 Anorexia: Secondary | ICD-10-CM | POA: Diagnosis present

## 2021-05-12 DIAGNOSIS — R112 Nausea with vomiting, unspecified: Secondary | ICD-10-CM

## 2021-05-12 DIAGNOSIS — F419 Anxiety disorder, unspecified: Secondary | ICD-10-CM | POA: Diagnosis present

## 2021-05-12 DIAGNOSIS — Z825 Family history of asthma and other chronic lower respiratory diseases: Secondary | ICD-10-CM

## 2021-05-12 DIAGNOSIS — S0990XA Unspecified injury of head, initial encounter: Secondary | ICD-10-CM | POA: Diagnosis not present

## 2021-05-12 DIAGNOSIS — Z79890 Hormone replacement therapy (postmenopausal): Secondary | ICD-10-CM | POA: Diagnosis not present

## 2021-05-12 DIAGNOSIS — F329 Major depressive disorder, single episode, unspecified: Secondary | ICD-10-CM | POA: Diagnosis present

## 2021-05-12 DIAGNOSIS — I82402 Acute embolism and thrombosis of unspecified deep veins of left lower extremity: Secondary | ICD-10-CM | POA: Diagnosis present

## 2021-05-12 DIAGNOSIS — D649 Anemia, unspecified: Secondary | ICD-10-CM | POA: Diagnosis present

## 2021-05-12 DIAGNOSIS — G4733 Obstructive sleep apnea (adult) (pediatric): Secondary | ICD-10-CM | POA: Diagnosis not present

## 2021-05-12 DIAGNOSIS — F0390 Unspecified dementia without behavioral disturbance: Secondary | ICD-10-CM | POA: Diagnosis present

## 2021-05-12 DIAGNOSIS — K529 Noninfective gastroenteritis and colitis, unspecified: Secondary | ICD-10-CM | POA: Diagnosis present

## 2021-05-12 DIAGNOSIS — R1013 Epigastric pain: Secondary | ICD-10-CM | POA: Diagnosis not present

## 2021-05-12 DIAGNOSIS — I2699 Other pulmonary embolism without acute cor pulmonale: Secondary | ICD-10-CM | POA: Diagnosis not present

## 2021-05-12 DIAGNOSIS — R0789 Other chest pain: Secondary | ICD-10-CM | POA: Diagnosis not present

## 2021-05-12 DIAGNOSIS — N189 Chronic kidney disease, unspecified: Secondary | ICD-10-CM | POA: Diagnosis not present

## 2021-05-12 DIAGNOSIS — K219 Gastro-esophageal reflux disease without esophagitis: Secondary | ICD-10-CM | POA: Diagnosis not present

## 2021-05-12 DIAGNOSIS — E876 Hypokalemia: Secondary | ICD-10-CM | POA: Diagnosis present

## 2021-05-12 DIAGNOSIS — E039 Hypothyroidism, unspecified: Secondary | ICD-10-CM | POA: Diagnosis not present

## 2021-05-12 DIAGNOSIS — E213 Hyperparathyroidism, unspecified: Secondary | ICD-10-CM | POA: Diagnosis present

## 2021-05-12 DIAGNOSIS — G4730 Sleep apnea, unspecified: Secondary | ICD-10-CM | POA: Diagnosis present

## 2021-05-12 DIAGNOSIS — Z79899 Other long term (current) drug therapy: Secondary | ICD-10-CM | POA: Diagnosis not present

## 2021-05-12 DIAGNOSIS — Z86718 Personal history of other venous thrombosis and embolism: Secondary | ICD-10-CM

## 2021-05-12 DIAGNOSIS — E785 Hyperlipidemia, unspecified: Secondary | ICD-10-CM | POA: Diagnosis present

## 2021-05-12 DIAGNOSIS — I6381 Other cerebral infarction due to occlusion or stenosis of small artery: Secondary | ICD-10-CM | POA: Diagnosis not present

## 2021-05-12 DIAGNOSIS — K760 Fatty (change of) liver, not elsewhere classified: Secondary | ICD-10-CM | POA: Diagnosis not present

## 2021-05-12 DIAGNOSIS — I4891 Unspecified atrial fibrillation: Secondary | ICD-10-CM | POA: Diagnosis present

## 2021-05-12 DIAGNOSIS — Z888 Allergy status to other drugs, medicaments and biological substances status: Secondary | ICD-10-CM

## 2021-05-12 DIAGNOSIS — G4489 Other headache syndrome: Secondary | ICD-10-CM | POA: Diagnosis not present

## 2021-05-12 DIAGNOSIS — Z9841 Cataract extraction status, right eye: Secondary | ICD-10-CM

## 2021-05-12 DIAGNOSIS — Z743 Need for continuous supervision: Secondary | ICD-10-CM | POA: Diagnosis not present

## 2021-05-12 DIAGNOSIS — I4510 Unspecified right bundle-branch block: Secondary | ICD-10-CM | POA: Diagnosis not present

## 2021-05-12 DIAGNOSIS — K5730 Diverticulosis of large intestine without perforation or abscess without bleeding: Secondary | ICD-10-CM | POA: Diagnosis not present

## 2021-05-12 DIAGNOSIS — R6889 Other general symptoms and signs: Secondary | ICD-10-CM | POA: Diagnosis not present

## 2021-05-12 DIAGNOSIS — I130 Hypertensive heart and chronic kidney disease with heart failure and stage 1 through stage 4 chronic kidney disease, or unspecified chronic kidney disease: Secondary | ICD-10-CM | POA: Diagnosis present

## 2021-05-12 DIAGNOSIS — Z91048 Other nonmedicinal substance allergy status: Secondary | ICD-10-CM

## 2021-05-12 DIAGNOSIS — Z885 Allergy status to narcotic agent status: Secondary | ICD-10-CM

## 2021-05-12 DIAGNOSIS — K5900 Constipation, unspecified: Secondary | ICD-10-CM | POA: Diagnosis not present

## 2021-05-12 DIAGNOSIS — Z833 Family history of diabetes mellitus: Secondary | ICD-10-CM

## 2021-05-12 DIAGNOSIS — I10 Essential (primary) hypertension: Secondary | ICD-10-CM | POA: Diagnosis present

## 2021-05-12 DIAGNOSIS — Z880 Allergy status to penicillin: Secondary | ICD-10-CM

## 2021-05-12 DIAGNOSIS — Z8249 Family history of ischemic heart disease and other diseases of the circulatory system: Secondary | ICD-10-CM

## 2021-05-12 DIAGNOSIS — R0902 Hypoxemia: Secondary | ICD-10-CM | POA: Diagnosis not present

## 2021-05-12 DIAGNOSIS — D259 Leiomyoma of uterus, unspecified: Secondary | ICD-10-CM | POA: Diagnosis not present

## 2021-05-12 HISTORY — DX: Nausea with vomiting, unspecified: R11.2

## 2021-05-12 LAB — BASIC METABOLIC PANEL
Anion gap: 11 (ref 5–15)
BUN: 8 mg/dL (ref 8–23)
CO2: 25 mmol/L (ref 22–32)
Calcium: 10.1 mg/dL (ref 8.9–10.3)
Chloride: 99 mmol/L (ref 98–111)
Creatinine, Ser: 0.85 mg/dL (ref 0.44–1.00)
GFR, Estimated: >60 mL/min (ref >60)
Glucose, Bld: 111 mg/dL — ABNORMAL HIGH (ref 70–99)
Potassium: 4.1 mmol/L (ref 3.5–5.1)
Sodium: 135 mmol/L (ref 135–145)

## 2021-05-12 LAB — CBG MONITORING, ED: Glucose-Capillary: 116 mg/dL — ABNORMAL HIGH (ref 70–99)

## 2021-05-12 LAB — CBC
HCT: 40.9 % (ref 36.0–46.0)
Hemoglobin: 13.5 g/dL (ref 12.0–15.0)
MCH: 29.5 pg (ref 26.0–34.0)
MCHC: 33 g/dL (ref 30.0–36.0)
MCV: 89.3 fL (ref 80.0–100.0)
Platelets: 311 10*3/uL (ref 150–400)
RBC: 4.58 MIL/uL (ref 3.87–5.11)
RDW: 14.5 % (ref 11.5–15.5)
WBC: 8 10*3/uL (ref 4.0–10.5)
nRBC: 0 % (ref 0.0–0.2)

## 2021-05-12 LAB — HEPATIC FUNCTION PANEL
ALT: 40 U/L (ref 0–44)
AST: 50 U/L — ABNORMAL HIGH (ref 15–41)
Albumin: 4.5 g/dL (ref 3.5–5.0)
Alkaline Phosphatase: 79 U/L (ref 38–126)
Bilirubin, Direct: 0.2 mg/dL (ref 0.0–0.2)
Indirect Bilirubin: 0.7 mg/dL (ref 0.3–0.9)
Total Bilirubin: 0.9 mg/dL (ref 0.3–1.2)
Total Protein: 7.8 g/dL (ref 6.5–8.1)

## 2021-05-12 LAB — LIPASE, BLOOD: Lipase: 27 U/L (ref 11–51)

## 2021-05-12 LAB — RESP PANEL BY RT-PCR (FLU A&B, COVID) ARPGX2
Influenza A by PCR: NEGATIVE
Influenza B by PCR: NEGATIVE
SARS Coronavirus 2 by RT PCR: NEGATIVE

## 2021-05-12 LAB — TROPONIN I (HIGH SENSITIVITY)
Troponin I (High Sensitivity): 13 ng/L (ref <18)
Troponin I (High Sensitivity): 15 ng/L (ref <18)

## 2021-05-12 MED ORDER — ONDANSETRON HCL 4 MG PO TABS
4.0000 mg | ORAL_TABLET | Freq: Four times a day (QID) | ORAL | Status: DC | PRN
  Filled 2021-05-12: qty 1

## 2021-05-12 MED ORDER — ONDANSETRON HCL 4 MG/2ML IJ SOLN
4.0000 mg | INTRAMUSCULAR | Status: AC
  Administered 2021-05-12: 4 mg via INTRAVENOUS
  Filled 2021-05-12: qty 2

## 2021-05-12 MED ORDER — SODIUM CHLORIDE 0.9 % IV SOLN
12.5000 mg | Freq: Four times a day (QID) | INTRAVENOUS | Status: DC | PRN
  Administered 2021-05-15: 12.5 mg via INTRAVENOUS
  Filled 2021-05-12: qty 12.5
  Filled 2021-05-12: qty 0.5

## 2021-05-12 MED ORDER — ACETAMINOPHEN 650 MG RE SUPP: 650.0000 mg | Freq: Four times a day (QID) | RECTAL | Status: AC | PRN

## 2021-05-12 MED ORDER — SODIUM CHLORIDE 0.9 % IV BOLUS
500.0000 mL | Freq: Once | INTRAVENOUS | Status: AC
  Administered 2021-05-12: 500 mL via INTRAVENOUS

## 2021-05-12 MED ORDER — SPIRONOLACTONE 25 MG PO TABS
25.0000 mg | ORAL_TABLET | Freq: Every day | ORAL | Status: DC
  Administered 2021-05-12 – 2021-05-14 (×3): 25 mg via ORAL
  Filled 2021-05-12 (×4): qty 1

## 2021-05-12 MED ORDER — PANTOPRAZOLE SODIUM 40 MG PO TBEC
40.0000 mg | DELAYED_RELEASE_TABLET | Freq: Every day | ORAL | Status: DC
  Administered 2021-05-12 – 2021-05-14 (×3): 40 mg via ORAL
  Filled 2021-05-12 (×3): qty 1

## 2021-05-12 MED ORDER — AMLODIPINE BESYLATE 5 MG PO TABS
10.0000 mg | ORAL_TABLET | Freq: Once | ORAL | Status: AC
  Administered 2021-05-12: 10 mg via ORAL
  Filled 2021-05-12: qty 2

## 2021-05-12 MED ORDER — FLUTICASONE PROPIONATE 50 MCG/ACT NA SUSP
1.0000 | Freq: Every day | NASAL | Status: DC | PRN
  Filled 2021-05-12: qty 16

## 2021-05-12 MED ORDER — LEVOTHYROXINE SODIUM 50 MCG PO TABS
50.0000 ug | ORAL_TABLET | Freq: Every day | ORAL | Status: DC
  Administered 2021-05-13 – 2021-05-20 (×8): 50 ug via ORAL
  Filled 2021-05-12 (×8): qty 1

## 2021-05-12 MED ORDER — LACTATED RINGERS IV SOLN: INTRAVENOUS | Status: AC

## 2021-05-12 MED ORDER — ACETAMINOPHEN 325 MG PO TABS
650.0000 mg | ORAL_TABLET | Freq: Four times a day (QID) | ORAL | Status: AC | PRN
  Administered 2021-05-13 – 2021-05-14 (×3): 650 mg via ORAL
  Filled 2021-05-12 (×4): qty 2

## 2021-05-12 MED ORDER — CARVEDILOL 6.25 MG PO TABS
3.1250 mg | ORAL_TABLET | Freq: Two times a day (BID) | ORAL | Status: DC
  Filled 2021-05-12 (×2): qty 1

## 2021-05-12 MED ORDER — LABETALOL HCL 5 MG/ML IV SOLN
10.0000 mg | Freq: Once | INTRAVENOUS | Status: AC
  Administered 2021-05-12: 10 mg via INTRAVENOUS
  Filled 2021-05-12: qty 4

## 2021-05-12 MED ORDER — LORAZEPAM 2 MG/ML IJ SOLN
0.5000 mg | Freq: Once | INTRAMUSCULAR | Status: AC
  Administered 2021-05-12: 0.5 mg via INTRAVENOUS
  Filled 2021-05-12: qty 1

## 2021-05-12 MED ORDER — APIXABAN 2.5 MG PO TABS
2.5000 mg | ORAL_TABLET | Freq: Two times a day (BID) | ORAL | Status: DC
  Administered 2021-05-12 – 2021-05-13 (×2): 2.5 mg via ORAL
  Filled 2021-05-12 (×3): qty 1

## 2021-05-12 MED ORDER — LORAZEPAM 0.5 MG PO TABS
0.5000 mg | ORAL_TABLET | Freq: Three times a day (TID) | ORAL | Status: DC | PRN
  Administered 2021-05-14 – 2021-05-19 (×5): 0.5 mg via ORAL
  Filled 2021-05-12 (×5): qty 1

## 2021-05-12 MED ORDER — TRAZODONE HCL 50 MG PO TABS
50.0000 mg | ORAL_TABLET | Freq: Every day | ORAL | Status: DC
  Administered 2021-05-12 – 2021-05-19 (×8): 50 mg via ORAL
  Filled 2021-05-12 (×8): qty 1

## 2021-05-12 MED ORDER — CITALOPRAM HYDROBROMIDE 20 MG PO TABS
10.0000 mg | ORAL_TABLET | Freq: Every day | ORAL | Status: DC
  Administered 2021-05-12 – 2021-05-20 (×9): 10 mg via ORAL
  Filled 2021-05-12 (×9): qty 1

## 2021-05-12 MED ORDER — ENOXAPARIN SODIUM 40 MG/0.4ML IJ SOSY: 40.0000 mg | PREFILLED_SYRINGE | INTRAMUSCULAR | Status: DC

## 2021-05-12 MED ORDER — AMLODIPINE BESYLATE 10 MG PO TABS
10.0000 mg | ORAL_TABLET | Freq: Every day | ORAL | Status: DC
  Administered 2021-05-13 – 2021-05-19 (×7): 10 mg via ORAL
  Filled 2021-05-12 (×7): qty 1
  Filled 2021-05-12: qty 2
  Filled 2021-05-12: qty 1

## 2021-05-12 MED ORDER — ONDANSETRON HCL 4 MG/2ML IJ SOLN
4.0000 mg | Freq: Four times a day (QID) | INTRAMUSCULAR | Status: DC | PRN
  Administered 2021-05-14 – 2021-05-18 (×3): 4 mg via INTRAVENOUS
  Filled 2021-05-12 (×3): qty 2

## 2021-05-12 MED ORDER — ATORVASTATIN CALCIUM 20 MG PO TABS
40.0000 mg | ORAL_TABLET | Freq: Every day | ORAL | Status: DC
  Administered 2021-05-12 – 2021-05-14 (×3): 40 mg via ORAL
  Filled 2021-05-12 (×3): qty 2

## 2021-05-12 NOTE — ED Notes (Signed)
Brief changed for urine incontinence.  Pt repositioned in bed for comfort.

## 2021-05-12 NOTE — H&P (Signed)
History and Physical   Erin Sutton DOB: 04-Feb-1941 DOA: 05/12/2021  PCP: Baxter Hire, MD  Outpatient Specialists: Dr. Nehemiah Massed, [facility]  Patient coming from: home via EMS  I have personally briefly reviewed patient's old medical records in [place].  Chief Concern: Multiple complaints  HPI: Erin Sutton is a 80 y.o. female with medical history significant for anxiety, depression, hypertension, hyperlipidemia, atrial fibrillation, history of left lower extremity DVT, history of bilateral pulmonary embolism, GERD, history of major depressive disorder, hypothyroid, mild to moderate dementia, constipation, presents to the emergency department for multiple concerns, chiefly nausea and vomiting.  She denies changes to diet. She reports the nausea and vomiting started 05/11/21. Her last PO intake was 05/10/21 and it was a baked potatoe that she made. She reports she has not been able to eat, keep anything down, including medications since 05/12/21 AM.  She reports that her vomitus is yellow and bitter and sour tasting.  She reports she took 2 doses of medication that is spelled 'ondans' without improvement, prompting her to present to the emergency department for further evaluation.  Social history: She lives at home by herself. She states she feels very lonely. Her daughter and son-in-law live in [place].  Patient has been a widow for 12 years.  She receives help from her niece who drives her to her doctor's appointment and grocery shopping.  In 2 weeks, patient is going to visit her daughter and son-in-law in [place] for 3 weeks.  She feels very excited about that. She denies tobacco, etoh, recreational drug use. She is retired and formerly worked for [employer] for 20 years.  I advise that she should consider moving in with family, however she states she does not want to live in [place] to do it being "too fast living  ".  Vaccination: She is vaccinated for covid-19, 3 doses of Moderna  ROS: Constitutional: no weight change, no fever ENT/Mouth: no sore throat, no rhinorrhea Eyes: no eye pain, no vision changes Cardiovascular: no chest pain, no dyspnea,  no edema, no palpitations Respiratory: no cough, no sputum, no wheezing Gastrointestinal: + nausea, + vomiting, no diarrhea, no constipation Genitourinary: no urinary incontinence, no dysuria, no hematuria Musculoskeletal: no arthralgias, no myalgias Skin: no skin lesions, no pruritus, Neuro: + weakness, no loss of consciousness, no syncope Psych: no anxiety, no depression, + decrease appetite Heme/Lymph: no bruising, no bleeding  ED Course: Discussed with ED provider, patient requiring hospitalization for intractable nausea and vomiting.  Vitals in the emergency department was remarkable for temperature of 98, respiration rate of 19, heart rate of 80, blood pressure initially 180/94, SPO2 98% on room air.  Patient had 1 episode of documented blood pressure of 207/98 which improved to 168/83 after EDP gave IV antihypertensive medications.  In the emergency department she received Norvasc 10 mg p.o. once, labetalol 10 mg IV, Ativan 0.5 mg IV, Zofran 4 mg IV x2, normal saline 500 mL bolus x 2.  After 2 doses of Zofran totaling 8 mg IV, Ativan 0.5 mg IV, she endorses persistent nausea and dry heaving.  Assessment/Plan  Principal Problem:   Intractable vomiting with nausea Active Problems:   Hypothyroidism   HTN (hypertension)   GERD (gastroesophageal reflux disease)   Anxiety   Bilateral pulmonary embolism (HCC)   Hyperlipidemia   Sleep apnea   Dementia (HCC)   Left leg DVT (HCC)   Major depressive disorder, single episode   Constipation  Hyperparathyroidism ([place])   Gastroenteritis   # Intractable nausea and vomiting # CT read of colitis - Etiology work-up in progress - Query gastroenteritis - Symptomatic control: Zofran 4 mg p.o.  every 6 hours as needed for nausea, 4 mg IV every 6 hours as needed for nausea and vomiting, Phenergan 12.5 mg IV every 6 hours as needed for refractory nausea and vomiting, 3 doses ordered - Status post normal saline 500 mL bolus x2 - Ordered LR IVF at 100 mL/h, 5 hours ordered  # Hypertensive urgency documented once-suspect secondary to gastroenteritis and intractable nausea and vomiting - Resolved status post labetalol 10 mg IV per EDP  # Hypertension-elevated suspect secondary to increased stress in setting of gastroenteritis and intractable nausea and vomiting - Resumed home carvedilol 3.125 mg p.o. twice daily - Resumed spironolactone 25 mg daily, amlodipine 10 mg daily  # History of bilateral pulmonary embolism, left lower extremity DVT, and atrial fibrillation - Resumed Eliquis 2.5 mg p.o. twice daily  # Hyperlipidemia-atorvastatin 40 mg nightly resumed  # Hypothyroid-levothyroxine 50 mcg daily before breakfast resumed  # Anxiety/panic attacks-resumed home Ativan 0.5 mg p.o. 3 times daily as needed for anxiety  # GERD-PPI daily resumed  # Chronic heart failure preserved ejection fraction, grade 1 diastolic dysfunction - Moderate LVH - Appears euvolemic and compensated at this time   Chart reviewed.   Echocardiogram on 02/28/2021 was read as EF estimated at 70 to 40%, grade 1 diastolic dysfunction, left ventricle has hyperdynamic function.  DVT prophylaxis: Eliquis 2.5 mg p.o. twice daily Code Status: Full code Diet: Heart healthy as tolerated Family Communication: No Disposition Plan: Pending clinical course, suspect can be discharged on 05/13/2021 Consults called: Not indicated at this time Admission status: MedSurg, observation, no telemetry indicated  Past Medical History:  Diagnosis Date   Anxiety    Arthritis    Chronic kidney disease    Constipation    GERD (gastroesophageal reflux disease)    Hypertension    Hypothyroidism    Osteoporosis    Sleep apnea     Urinary incontinence    Past Surgical History:  Procedure Laterality Date   BREAST BIOPSY Right    neg/stereo   BREAST BIOPSY Left    neg/stereo   BREAST EXCISIONAL BIOPSY Right    neg   CATARACT EXTRACTION, BILATERAL     COLONOSCOPY WITH PROPOFOL N/A 07/02/2015   Procedure: COLONOSCOPY WITH PROPOFOL;  Surgeon: Manya Silvas, MD;  Location: [facility];  Service: Endoscopy;  Laterality: N/A;   CORRECTION HAMMER TOE Right    ESOPHAGOGASTRODUODENOSCOPY (EGD) WITH PROPOFOL N/A 07/02/2015   Procedure: ESOPHAGOGASTRODUODENOSCOPY (EGD) WITH PROPOFOL;  Surgeon: Manya Silvas, MD;  Location: [facility] ENDOSCOPY;  Service: Endoscopy;  Laterality: N/A;   JOINT REPLACEMENT Left    knee   REPLACEMENT TOTAL KNEE Left    SKIN BIOPSY Right    Social History:  reports that she has never smoked. She has never used smokeless tobacco. She reports that she does not drink alcohol and does not use drugs.  Allergies  Allergen Reactions   Ace Inhibitors Cough   Levofloxacin    Penicillin G Other (See Comments)   Tape Other (See Comments)    Other reaction(s): Other (See Comments)   Amoxicillin Rash    Has patient had a PCN reaction causing immediate rash, facial/tongue/throat swelling, SOB or lightheadedness with hypotension: No Has patient had a PCN reaction causing severe rash involving mucus membranes or skin necrosis: No Has patient had a  PCN reaction that required hospitalization: No Has patient had a PCN reaction occurring within the last 10 years: No If all of the above answers are "NO", then may proceed with Cephalosporin use.    Benicar [Olmesartan] Rash    Itching rash   Codeine Rash   Codeine Sulfate Rash   Levaquin [Levofloxacin In D5w] Rash    Alters mental status   Penicillin V Potassium Rash    Has patient had a PCN reaction causing immediate rash, facial/tongue/throat swelling, SOB or lightheadedness with hypotension: No Has patient had a PCN reaction causing severe rash  involving mucus membranes or skin necrosis: No Has patient had a PCN reaction that required hospitalization: No Has patient had a PCN reaction occurring within the last 10 years: No If all of the above answers are "NO", then may proceed with Cephalosporin use.    Sulfa Antibiotics Rash   Family History  Problem Relation Age of Onset   Hypertension Mother    Diabetes Mother    Heart attack Mother    Asthma Mother    Peripheral vascular disease Mother    Heart attack Father    Hypertension Father    Diabetes Father    Colon cancer Maternal Aunt    Breast cancer Cousin    Family history: Family history reviewed and not pertinent  Prior to Admission medications   Medication Sig Start Date End Date Taking? Authorizing Provider  amLODipine (NORVASC) 10 MG tablet Take 10 mg by mouth daily.    [provider]  apixaban (ELIQUIS) 2.5 MG TABS tablet Take 1 tablet (2.5 mg total) by mouth 2 (two) times daily. 02/02/20   Earlie Server, MD  atorvastatin (LIPITOR) 40 MG tablet Take 1 tablet by mouth daily. 02/15/21   [provider]  carvedilol (COREG) 3.125 MG tablet Take 3.125 mg by mouth 2 (two) times daily. 02/13/21   [provider]  isosorbide mononitrate (IMDUR) 30 MG 24 hr tablet Take 30 mg by mouth daily.    [provider]  levothyroxine (SYNTHROID, LEVOTHROID) 50 MCG tablet Take 50 mcg by mouth daily before breakfast.    [provider]  LORazepam (ATIVAN) 0.5 MG tablet Take 0.5 mg by mouth 3 (three) times daily as needed. 02/01/21   [provider]  metoCLOPramide (REGLAN) 5 MG tablet Take 1 tablet (5 mg total) by mouth 3 (three) times daily for 3 days. Patient not taking: Reported on 02/28/2021 04/15/20 04/18/20  Sharen Hones, MD  pantoprazole (PROTONIX) 40 MG tablet Take 40 mg by mouth daily.    [provider]  Polyethyl Glycol-Propyl Glycol (SYSTANE OP) Place 1 drop into both eyes 2 (two) times daily.    [provider]   potassium chloride SA (K-DUR,KLOR-CON) 20 MEQ tablet Take 20 mEq by mouth every morning.    [provider]  senna-docusate (SENOKOT-S) 8.6-50 MG tablet Take 2 tablets by mouth 2 (two) times daily as needed for mild constipation. 04/15/20   Sharen Hones, MD   Physical Exam: Vitals:   05/12/21 1645 05/12/21 1700 05/12/21 1715 05/12/21 1800  BP: (!) 167/77 (!) 166/90 (!) 165/81 (!) 165/83  Pulse: 69 72 78 70  Resp: 15 17 20 14   Temp:      TempSrc:      SpO2: 91% 96% 96% 95%  Weight:      Height:       Constitutional: appears age-appropriate, NAD, calm, comfortable Eyes: PERRL, lids and conjunctivae normal ENMT: Mucous membranes are moist. Posterior  pharynx clear of any exudate or lesions. Age-appropriate dentition. Hearing appropriate Neck: normal, supple, no masses, no thyromegaly Respiratory: clear to auscultation bilaterally, no wheezing, no crackles. Normal respiratory effort. No accessory muscle use.  Cardiovascular: Regular rate and rhythm, no murmurs / rubs / gallops. No extremity edema. 2+ pedal pulses. No carotid bruits.  Abdomen: no tenderness, no masses palpated, no hepatosplenomegaly. Bowel sounds positive.  Musculoskeletal: no clubbing / cyanosis. No joint deformity upper and lower extremities. Good ROM, no contractures, no atrophy. Normal muscle tone.  Skin: no rashes, lesions, ulcers. No induration Neurologic: Sensation intact. Strength 5/5 in all 4.  Psychiatric: Normal judgment and insight. Alert and oriented x 3.  Depressed mood.  Flat affect  EKG: independently reviewed, showing sinus rhythm with rate of 76, QTc 459, right bundle branch block present on previous EKG, specifically 02/27/2021  Chest x-ray on Admission: I personally reviewed and I agree with radiologist reading as below.  CT ABDOMEN PELVIS WO CONTRAST  Result Date: 05/12/2021 CLINICAL DATA:  Nausea vomiting. EXAM: CT ABDOMEN AND PELVIS WITHOUT CONTRAST TECHNIQUE: Multidetector CT imaging of the  abdomen and pelvis was performed following the standard protocol without IV contrast. COMPARISON:  Apr 13, 2020 FINDINGS: Lower chest: No acute abnormality. Hepatobiliary: Normal appearance of the liver. Sub pathologic distension of the gallbladder measuring 4 cm transversely. Pancreas: Unremarkable. No pancreatic ductal dilatation or surrounding inflammatory changes. Spleen: Normal in size without focal abnormality. Adrenals/Urinary Tract: Adrenal glands are unremarkable. Kidneys are normal, without renal calculi, focal lesion, or hydronephrosis. Bladder is unremarkable. Stomach/Bowel: Stomach is within normal limits. Appendix appears normal. No evidence of small bowel wall thickening, distention, or inflammatory changes. Diffuse circumferential wall thickening of the sigmoid colon, on the background of extensive diverticulosis. No evidence of significant pericolonic inflammatory changes. Vascular/Lymphatic: Aortic atherosclerosis. No enlarged abdominal or pelvic lymph nodes. Reproductive: Leiomyomatous uterus. Other: No abdominal wall hernia or abnormality. No abdominopelvic ascites. Musculoskeletal: Scoliosis and spondylosis of the spine. IMPRESSION: 1. Sub pathologic distension of the gallbladder measuring 4 cm transversely. No CT evidence of acute cholecystitis. 2. Diffuse circumferential wall thickening of the sigmoid colon, on the background of extensive diverticulosis, probable colitis. 3. Leiomyomatous uterus. 4. Scoliosis and spondylosis of the spine. 5. Aortic atherosclerosis. Aortic Atherosclerosis (ICD10-I70.0). Electronically Signed   By: Fidela Salisbury M.D.   On: 05/12/2021 16:22   DG Chest 2 View  Result Date: 05/12/2021 CLINICAL DATA:  Chest pain. Left center chest tightness for 2 years. History of atrial fibrillation. EXAM: CHEST - 2 VIEW COMPARISON:  Prior chest radiographs 02/27/2021 and earlier. FINDINGS: Heart size at the upper limits of normal, unchanged. Aortic atherosclerosis. No  appreciable airspace consolidation or pulmonary edema. No evidence of pleural effusion or pneumothorax. No acute bony abnormality identified. Bilateral glenohumeral joint osteoarthrosis. Degenerative changes of the spine. IMPRESSION: No evidence of acute cardiopulmonary abnormality. Aortic Atherosclerosis (ICD10-I70.0). Electronically Signed   By: Kellie Simmering DO   On: 05/12/2021 11:10   CT Head Wo Contrast  Result Date: 05/12/2021 CLINICAL DATA:  Head trauma, intracranial venous injury suspected. EXAM: CT HEAD WITHOUT CONTRAST TECHNIQUE: Contiguous axial images were obtained from the base of the skull through the vertex without intravenous contrast. COMPARISON:  Brain MRI 02/27/2021. Prior head CT examinations 02/27/2021 and earlier. FINDINGS: Brain: Cerebral volume is normal for age. 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 Redemonstrated small chronic left thalamic lacunar infarct. There is no acute intracranial hemorrhage. No acute demarcated cortical infarct. No extra-axial fluid collection. No  evidence of intracranial mass. No midline shift. Vascular: No hyperdense vessel.  Atherosclerotic calcifications. Skull: Normal. Negative for fracture or focal lesion. Sinuses/Orbits: Visualized orbits show no acute finding. Trace bilateral ethmoid sinus mucosal thickening. IMPRESSION: No evidence of acute intracranial abnormality. 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 Redemonstrated chronic left thalamic lacunar infarct. Electronically Signed   By: Kellie Simmering DO   On: 05/12/2021 16:23   US ABDOMEN LIMITED RUQ (LIVER/GB)  Result Date: 05/12/2021 CLINICAL DATA:  Right upper quadrant pain for 2 weeks, distended gallbladder on recent CT examination. EXAM: ULTRASOUND ABDOMEN LIMITED RIGHT UPPER QUADRANT COMPARISON:  CT from earlier in the same day. FINDINGS: Gallbladder: No gallstones or wall thickening visualized. No  sonographic Murphy sign noted by sonographer. Gallbladder sludge is noted. Common bile duct: Diameter: 4.8 mm. Liver: Mildly increased in echogenicity consistent with fatty infiltration. No mass is noted. Portal vein is patent on color Doppler imaging with normal direction of blood flow towards the liver. Other: None. IMPRESSION: Fatty liver without focal mass. Gallbladder sludge. No complicating factors in the gallbladder are noted. Electronically Signed   By: Inez Catalina M.D.   On: 05/12/2021 17:29    Labs on Admission: I have personally reviewed following labs  CBC: Recent Labs  Lab 05/12/21 1031  WBC 8.0  HGB 13.5  HCT 40.9  MCV 89.3  PLT 119   Basic Metabolic Panel: Recent Labs  Lab 05/12/21 1031  NA 135  K 4.1  CL 99  CO2 25  GLUCOSE 111*  BUN 8  CREATININE 0.85  CALCIUM 10.1   GFR: Estimated Creatinine Clearance: 48.4 mL/min (by C-G formula based on SCr of 0.85 mg/dL).  Liver Function Tests: Recent Labs  Lab 05/12/21 1546  AST 50*  ALT 40  ALKPHOS 79  BILITOT 0.9  PROT 7.8  ALBUMIN 4.5   Recent Labs  Lab 05/12/21 1546  LIPASE 27   CBG: Recent Labs  Lab 05/12/21 1757  GLUCAP 116*   Urine analysis:    Component Value Date/Time   COLORURINE COLORLESS (A) 02/27/2021 1225   APPEARANCEUR CLEAR (A) 02/27/2021 1225   APPEARANCEUR [place] 07/25/2014 1350   LABSPEC 1.004 (L) 02/27/2021 1225   LABSPEC 1.020 07/25/2014 1350   PHURINE 9.0 (H) 02/27/2021 1225   GLUCOSEU NEGATIVE 02/27/2021 1225   GLUCOSEU Negative 07/25/2014 1350   HGBUR SMALL (A) 02/27/2021 1225   BILIRUBINUR NEGATIVE 02/27/2021 1225   BILIRUBINUR Negative 07/25/2014 1350   KETONESUR NEGATIVE 02/27/2021 1225   PROTEINUR NEGATIVE 02/27/2021 1225   NITRITE NEGATIVE 02/27/2021 1225   LEUKOCYTESUR NEGATIVE 02/27/2021 1225   LEUKOCYTESUR 2+ 07/25/2014 1350   Dr. Tobie Poet [facility]  If 7PM-7AM, please contact overnight-coverage provider If 7AM-7PM, please contact day coverage  provider [email or web address]  05/12/2021, 6:17 PM
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
## 2021-05-12 NOTE — ED Notes (Signed)
Patient transported to CT 

## 2021-05-12 NOTE — ED Triage Notes (Signed)
Pt in via EMS from home with c/o pain and pressure over her entire body. Pt reports she comes here all the time for the same, we fix her she leaves and it comes back as soon as she gets home. Pt also reports nausea and HA. Pt has hx of HTN and is non compliant with meds

## 2021-05-12 NOTE — ED Notes (Signed)
Request made for transport  

## 2021-05-12 NOTE — ED Notes (Signed)
Ultrasound tech in room  

## 2021-05-12 NOTE — ED Notes (Signed)
Pt able to tolerate sips of diet ginger ale and PO meds without increased nausea. Pt did not want dinner tray.

## 2021-05-12 NOTE — ED Notes (Signed)
ED Provider at bedside. 

## 2021-05-12 NOTE — ED Provider Notes (Signed)
Emergency Medicine Provider Triage Evaluation Note  Erin Sutton , a 80 y.o. female  was evaluated in triage.  Pt complains of nausea and pressure in her chest. She denies explicit chest pain. Reports she also has associated abdominal pain that is new and began yesterday. Reports Hx of HTN and has not taken medications today. Denies any vomiting with nausea, denies constipation or diarrhea. Denies fever  Review of Systems  Positive: Nausea, chest tightness, abdominal pain Negative: Shortness of breath, fever, vomiting, diarrhea  Physical Exam  BP (!) 180/94 (BP Location: Left Arm)   Pulse 80   Temp 98 F (36.7 C) (Oral)   Resp 19   Ht 5' (1.524 m)   Wt 74.4 kg   SpO2 98%   BMI 32.03 kg/m  Gen:   Awake, no distress  Resp:  Normal effort MSK:   Moves extremities without difficulty Other:    Medical Decision Making  Medically screening exam initiated at 10:47 AM.  Appropriate orders placed.  Erin Sutton was informed that the remainder of the evaluation will be completed by another provider, this initial triage assessment does not replace that evaluation, and the importance of remaining in the ED until their evaluation is complete.     Marlana Salvage, PA 05/12/21 1049    Blake Divine, MD 05/12/21 [phone]

## 2021-05-12 NOTE — ED Notes (Signed)
Pt placed on bedpan per request, sts she was unable to have a BM this time.

## 2021-05-12 NOTE — ED Triage Notes (Signed)
EMS placed an IV and gave 4mg  of zofran en route.

## 2021-05-13 ENCOUNTER — Encounter: Payer: Self-pay | Admitting: Internal Medicine

## 2021-05-13 LAB — CBC
HCT: 35 % — ABNORMAL LOW (ref 36.0–46.0)
Hemoglobin: 11.8 g/dL — ABNORMAL LOW (ref 12.0–15.0)
MCH: 30.1 pg (ref 26.0–34.0)
MCHC: 33.7 g/dL (ref 30.0–36.0)
MCV: 89.3 fL (ref 80.0–100.0)
Platelets: 279 10*3/uL (ref 150–400)
RBC: 3.92 MIL/uL (ref 3.87–5.11)
RDW: 14.6 % (ref 11.5–15.5)
WBC: 7.3 10*3/uL (ref 4.0–10.5)
nRBC: 0 % (ref 0.0–0.2)

## 2021-05-13 LAB — BASIC METABOLIC PANEL
Anion gap: 7 (ref 5–15)
BUN: 9 mg/dL (ref 8–23)
CO2: 26 mmol/L (ref 22–32)
Calcium: 8.9 mg/dL (ref 8.9–10.3)
Chloride: 103 mmol/L (ref 98–111)
Creatinine, Ser: 0.88 mg/dL (ref 0.44–1.00)
GFR, Estimated: >60 mL/min (ref >60)
Glucose, Bld: 91 mg/dL (ref 70–99)
Potassium: 3.6 mmol/L (ref 3.5–5.1)
Sodium: 136 mmol/L (ref 135–145)

## 2021-05-13 LAB — PROCALCITONIN: Procalcitonin: <0.1 ng/mL

## 2021-05-13 MED ORDER — DEXTROSE-NACL 5-0.9 % IV SOLN: INTRAVENOUS | Status: DC

## 2021-05-13 MED ORDER — APIXABAN 5 MG PO TABS
5.0000 mg | ORAL_TABLET | Freq: Two times a day (BID) | ORAL | Status: DC
  Administered 2021-05-13 – 2021-05-18 (×10): 5 mg via ORAL
  Filled 2021-05-13 (×10): qty 1

## 2021-05-13 NOTE — Evaluation (Signed)
Occupational Therapy Evaluation Patient Details Name: Erin Sutton MRN: [id number] DOB: [birth year] Today's Date: 05/13/2021    History of Present Illness Pt is a 80 y/o F with PMH: HTN, hypothyroidism, and OSA who presented to ED d/t nausea and headache. Pt adm under observation d/t intractable nausea and vomiting with poor PO intake. W/u included CT positive for colitis.   Clinical Impression   Pt seen for OT evaluation this date in setting of acute hospitalization d/t N/V. Pt presents this date with c/o still feeling somewhat nauseated with headache and generally unwell. Requires gentle encouragement to participate with OT. Pt reports being MOD I for fxl mob and INDEP for self care including IADLs such as errands and meal prep at baseline. Pt states she lives in mobile home with 3 STE with R railing. Pt presents this date with decreased fxl activity tolerance and requires SUPV for sup to sit. Seated on EOB, demos G static sitting balance, but declines attempting to stand at this time. Pt able to perform UB ADLs with MOD I and requires MIN A for seated LB ADLs. Pt returned to supine with all needs met and in reach. Will continue to follow. Anticipate pt will be able [facility] with the caveat that today's evaluation was limited secondary to nausea and pt still needs to be assessed ambulating safely to solidify best/safest discharge recommendations. Will continue to follow.     Follow Up Recommendations  Home health OT    Equipment Recommendations  3 in 1 bedside commode;Tub/shower seat    Recommendations for Other Services       Precautions / Restrictions Precautions Precautions: Fall Restrictions Weight Bearing Restrictions: No      Mobility Bed Mobility Overal bed mobility: Needs Assistance Bed Mobility: Supine to Sit;Sit to Supine     Supine to sit: Supervision Sit to supine: Supervision        Transfers                 General transfer comment:  deferred    Balance Overall balance assessment: Needs assistance Sitting-balance support: Feet supported Sitting balance-Leahy Scale: Good         Standing balance comment: deferred                           ADL either performed or assessed with clinical judgement   ADL                                         General ADL Comments: SETUP to INDEP for UB ADLs, MIN A for seated LB ADLs. SUPV for bed mobility, declines to stand or walk with OT this date     Vision Patient Visual Report: No change from baseline       Perception     Praxis      Pertinent Vitals/Pain Pain Assessment: No/denies pain     Hand Dominance Right   Extremity/Trunk Assessment Upper Extremity Assessment Upper Extremity Assessment: Overall WFL for tasks assessed;Generalized weakness (ROM WFL, MMT grossly 4-/5)   Lower Extremity Assessment Lower Extremity Assessment: Overall WFL for tasks assessed;Generalized weakness       Communication Communication Communication: No difficulties   Cognition Arousal/Alertness: Awake/alert Behavior During Therapy: WFL for tasks assessed/performed Overall Cognitive Status: Within Functional Limits for tasks assessed  General Comments       Exercises Other Exercises Other Exercises: OT educates pt re: importance of OOB activity and incentive spirometer use to prevent PNA. Pt with good understanding.   Shoulder Instructions      Home Living Family/patient expects to be discharged to:: Private residence Living Arrangements: Alone Available Help at Discharge: Friend(s);Available PRN/intermittently (church friends help as needed.) Type of Home: Mobile home Home Access: Stairs to enter Technical brewer of Steps: 3 Entrance Stairs-Rails: Right Home Layout: One level     Bathroom Shower/Tub: Tub/shower unit         Home Equipment: Environmental consultant - 2 wheels;Cane - single  point;Bedside commode          Prior Functioning/Environment Level of Independence: Independent with assistive device(s)        Comments: Pt reports driving short distances for necessities like to grocery and appts. States she is able to perform all self care I'ly and uses RW for fxl mobility in the home and community. Church friends help with meals/errands as needed when patient is sick.        OT Problem List: Decreased strength;Decreased activity tolerance;Impaired balance (sitting and/or standing)      OT Treatment/Interventions: Self-care/ADL training;DME and/or AE instruction;Therapeutic activities;Balance training;Therapeutic exercise;Patient/family education    OT Goals(Current goals can be found in the care plan section) Acute Rehab OT Goals Patient Stated Goal: to get this nausea taken care of and go home OT Goal Formulation: With patient Time For Goal Achievement: 05/27/21 Potential to Achieve Goals: Good ADL Goals Pt Will Perform Upper Body Dressing: with modified independence;sitting Pt Will Perform Lower Body Dressing: with modified independence;sit to/from stand (with LRAD/AE PRN) Pt Will Transfer to Toilet: with supervision;ambulating (with LRAD to/from restroom) Pt Will Perform Toileting - Clothing Manipulation and hygiene: with supervision;sit to/from stand  OT Frequency: Min 1X/week   Barriers to D/C:            Co-evaluation              AM-PAC OT "6 Clicks" Daily Activity     Outcome Measure Help from another person eating meals?: None Help from another person taking care of personal grooming?: A Little Help from another person toileting, which includes using toliet, bedpan, or urinal?: A Little Help from another person bathing (including washing, rinsing, drying)?: A Little Help from another person to put on and taking off regular upper body clothing?: A Little Help from another person to put on and taking off regular lower body clothing?: A  Little 6 Click Score: 19   End of Session Nurse Communication: Mobility status  Activity Tolerance: Patient tolerated treatment well;Other (comment) (limited by nausea) Patient left: in bed;with call bell/phone within reach;with nursing/sitter in room  OT Visit Diagnosis: Unsteadiness on feet (R26.81);Muscle weakness (generalized) (M62.81)                Time: 2595-6387 OT Time Calculation (min): 17 min Charges:  OT General Charges $OT Visit: 1 Visit OT Evaluation $OT Eval Moderate Complexity: 1 Mod OT Treatments $Self Care/Home Management : 8-22 mins  Gerrianne Scale, MS, OTR/L ascom [phone] 05/13/21, 3:38 PM

## 2021-05-13 NOTE — Progress Notes (Signed)
Order received from Dr Priscella Mann for a full liquid diet

## 2021-05-13 NOTE — Progress Notes (Signed)
PT Cancellation Note  Patient Details Name: Erin Sutton MRN: [id number] DOB: 1940/12/13   Cancelled Treatment:    Reason Eval/Treat Not Completed: Patient declined, no reason specified. Patient not feeling up to it right now, will re-attempt later this pm as time allows.    Karlton Maya 05/13/2021, 1:44 PM

## 2021-05-13 NOTE — Progress Notes (Signed)
PROGRESS NOTE    Erin Sutton  WUG:[id number] DOB: 08-18-1941 DOA: 05/12/2021 PCP: Baxter Hire, MD    Brief Narrative:  80 y.o. female with medical history significant for anxiety, depression, hypertension, hyperlipidemia, atrial fibrillation, history of left lower extremity DVT, history of bilateral pulmonary embolism, GERD, history of major depressive disorder, hypothyroid, mild to moderate dementia, constipation, presents to the emergency department for multiple concerns, chiefly nausea and vomiting.   She denies changes to diet. She reports the nausea and vomiting started 05/11/21. Her last PO intake was 05/10/21 and it was a baked potatoe that she made. She reports she has not been able to eat, keep anything down, including medications since 05/12/21 AM.  She reports that her vomitus is yellow and bitter and sour tasting.   She reports she took 2 doses of medication that is spelled 'ondans' without improvement, prompting her to present to the emergency department for further evaluation.  CT demonstrates evidence of mild colitis.  Patient remains hemodynamically stable but is not able to tolerate p.o. intake   Assessment & Plan:   Principal Problem:   Intractable vomiting with nausea Active Problems:   Hypothyroidism   HTN (hypertension)   GERD (gastroesophageal reflux disease)   Anxiety   Bilateral pulmonary embolism (HCC)   Hyperlipidemia   Sleep apnea   Dementia (HCC)   Left leg DVT (HCC)   Major depressive disorder, single episode   Constipation   Hyperparathyroidism (HCC)   Gastroenteritis  Intractable nausea and vomiting CT read of colitis Possible differentials include infectious colitis, mild ischemic colitis.  Patient is afebrile, normal white blood cell count, negative procalcitonin. Possible viral gastroenteritis Hemodynamically stable but not tolerating p.o. with poor appetite Plan: IV fluids As needed antiemetics Full liquid diet Possible discharge  in 24-hour   Hypertension Hypertensive urgency noted in ED Resolved with 1 dose of IV labetalol Pressures are improved Patient states she does not take carvedilol Plan: Remove carvedilol from home MAR Resume Aldactone 25 mg daily, amlodipine 10 mg daily    History of bilateral pulmonary embolism, left lower extremity DVT, and atrial fibrillation - Resumed Eliquis 5 mg p.o. twice daily   Hyperlipidemia atorvastatin 40 mg nightly    Hypothyroid levothyroxine 50 mcg daily    Anxiety/panic attacks home Ativan 0.5 mg p.o. 3 times daily as needed   GERD PPI daily    Chronic heart failure preserved ejection fraction, grade 1 diastolic dysfunction - Moderate LVH - Appears euvolemic and compensated at this time    DVT prophylaxis: Eliquis Code Status: Full Family Communication: Daughter [name] [phone] on 6/20 Disposition Plan: Status is: Observation  The patient will require care spanning > 2 midnights and should be moved to inpatient because: Inpatient level of care appropriate due to severity of illness  Dispo: The patient is from: Home              Anticipated d/c is to: Home              Patient currently is not medically stable to d/c.   Difficult to place patient No  Patient not tolerating p.o. intake at this time.  Needs continued observation for IV fluids, IV antiemetics     Level of care: Med-Surg  Consultants:  None  Procedures:  None  Antimicrobials:  None   Subjective: Patient seen and examined.  Endorses some abdominal pain and poor p.o. intake.  Objective: Vitals:   05/12/21 2354 05/13/21 0411 05/13/21 0919 05/13/21 1108  BP: (!) 145/71 115/62 121/66 117/66  Pulse: 75 64 67 67  Resp: 18 20 14 17   Temp: 98.7 F (37.1 C) 98.4 F (36.9 C) 98.1 F (36.7 C) 99.1 F (37.3 C)  TempSrc: Oral Oral Oral Oral  SpO2: 97% 98% 94% 94%  Weight: 72.1 kg     Height: 5' (1.524 m)       Intake/Output Summary (Last 24 hours) at 05/13/2021  1320 Last data filed at 05/13/2021 1029 Gross per 24 hour  Intake 0 ml  Output 1600 ml  Net -1600 ml   Filed Weights   05/12/21 1020 05/12/21 2354  Weight: 74.4 kg 72.1 kg    Examination:  General exam: Appears calm and comfortable  Respiratory system: Clear to auscultation. Respiratory effort normal. Cardiovascular system: S1 & S2 heard, RRR. No JVD, murmurs, rubs, gallops or clicks. No pedal edema. Gastrointestinal system: Soft, nondistended, mild tender to palpation, normal bowel sounds Central nervous system: Alert and oriented. No focal neurological deficits. Extremities: Symmetric 5 x 5 power. Skin: No rashes, lesions or ulcers Psychiatry: Judgement and insight appear normal. Mood & affect appropriate.     Data Reviewed: I have personally reviewed following labs and imaging studies  CBC: Recent Labs  Lab 05/12/21 1031 05/13/21 0414  WBC 8.0 7.3  HGB 13.5 11.8*  HCT 40.9 35.0*  MCV 89.3 89.3  PLT 311 250   Basic Metabolic Panel: Recent Labs  Lab 05/12/21 1031 05/13/21 0414  NA 135 136  K 4.1 3.6  CL 99 103  CO2 25 26  GLUCOSE 111* 91  BUN 8 9  CREATININE 0.85 0.88  CALCIUM 10.1 8.9   GFR: Estimated Creatinine Clearance: 45.9 mL/min (by C-G formula based on SCr of 0.88 mg/dL). Liver Function Tests: Recent Labs  Lab 05/12/21 1546  AST 50*  ALT 40  ALKPHOS 79  BILITOT 0.9  PROT 7.8  ALBUMIN 4.5   Recent Labs  Lab 05/12/21 1546  LIPASE 27   No results for input(s): AMMONIA in the last 168 hours. Coagulation Profile: No results for input(s): INR, PROTIME in the last 168 hours. Cardiac Enzymes: No results for input(s): CKTOTAL, CKMB, CKMBINDEX, TROPONINI in the last 168 hours. BNP (last 3 results) No results for input(s): PROBNP in the last 8760 hours. HbA1C: No results for input(s): HGBA1C in the last 72 hours. CBG: Recent Labs  Lab 05/12/21 1757  GLUCAP 116*   Lipid Profile: No results for input(s): CHOL, HDL, LDLCALC, TRIG,  CHOLHDL, LDLDIRECT in the last 72 hours. Thyroid Function Tests: No results for input(s): TSH, T4TOTAL, FREET4, T3FREE, THYROIDAB in the last 72 hours. Anemia Panel: No results for input(s): VITAMINB12, FOLATE, FERRITIN, TIBC, IRON, RETICCTPCT in the last 72 hours. Sepsis Labs: Recent Labs  Lab 05/13/21 0414  PROCALCITON <0.10    Recent Results (from the past 240 hour(s))  Resp Panel by RT-PCR (Flu A&B, Covid) Nasopharyngeal Swab     Status: None   Collection Time: 05/12/21  3:46 PM   Specimen: Nasopharyngeal Swab; Nasopharyngeal(NP) swabs in vial transport medium  Result Value Ref Range Status   SARS Coronavirus 2 by RT PCR NEGATIVE NEGATIVE Final    Comment: (NOTE) SARS-CoV-2 target nucleic acids are NOT DETECTED.  The SARS-CoV-2 RNA is generally detectable in upper respiratory specimens during the acute phase of infection. The lowest concentration of SARS-CoV-2 viral copies this assay can detect is 138 copies/mL. A negative result does not preclude SARS-Cov-2 infection and should not be used as the sole basis  for treatment or other patient management decisions. A negative result may occur with  improper specimen collection/handling, submission of specimen other than nasopharyngeal swab, presence of viral mutation(s) within the areas targeted by this assay, and inadequate number of viral copies(<138 copies/mL). A negative result must be combined with clinical observations, patient history, and epidemiological information. The expected result is Negative.  Fact Sheet for Patients:  EntrepreneurPulse.com.au  Fact Sheet for Healthcare Providers:  IncredibleEmployment.be  This test is no t yet approved or cleared by the [place] FDA and  has been authorized for detection and/or diagnosis of SARS-CoV-2 by FDA under an Emergency Use Authorization (EUA). This EUA will remain  in effect (meaning this test can be used) for the duration of  the COVID-19 declaration under Section 564(b)(1) of the Act, 21 U.S.C.section 360bbb-3(b)(1), unless the authorization is terminated  or revoked sooner.       Influenza A by PCR NEGATIVE NEGATIVE Final   Influenza B by PCR NEGATIVE NEGATIVE Final    Comment: (NOTE) The Xpert Xpress SARS-CoV-2/FLU/RSV plus assay is intended as an aid in the diagnosis of influenza from Nasopharyngeal swab specimens and should not be used as a sole basis for treatment. Nasal washings and aspirates are unacceptable for Xpert Xpress SARS-CoV-2/FLU/RSV testing.  Fact Sheet for Patients: EntrepreneurPulse.com.au  Fact Sheet for Healthcare Providers: IncredibleEmployment.be  This test is not yet approved or cleared by the [place] FDA and has been authorized for detection and/or diagnosis of SARS-CoV-2 by FDA under an Emergency Use Authorization (EUA). This EUA will remain in effect (meaning this test can be used) for the duration of the COVID-19 declaration under Section 564(b)(1) of the Act, 21 U.S.C. section 360bbb-3(b)(1), unless the authorization is terminated or revoked.  Performed at [facility], [address], [place], [place] [address]          Radiology Studies: CT ABDOMEN PELVIS WO CONTRAST  Result Date: 05/12/2021 CLINICAL DATA:  Nausea vomiting. EXAM: CT ABDOMEN AND PELVIS WITHOUT CONTRAST TECHNIQUE: Multidetector CT imaging of the abdomen and pelvis was performed following the standard protocol without IV contrast. COMPARISON:  Apr 13, 2020 FINDINGS: Lower chest: No acute abnormality. Hepatobiliary: Normal appearance of the liver. Sub pathologic distension of the gallbladder measuring 4 cm transversely. Pancreas: Unremarkable. No pancreatic ductal dilatation or surrounding inflammatory changes. Spleen: Normal in size without focal abnormality. Adrenals/Urinary Tract: Adrenal glands are unremarkable. Kidneys are normal, without renal  calculi, focal lesion, or hydronephrosis. Bladder is unremarkable. Stomach/Bowel: Stomach is within normal limits. Appendix appears normal. No evidence of small bowel wall thickening, distention, or inflammatory changes. Diffuse circumferential wall thickening of the sigmoid colon, on the background of extensive diverticulosis. No evidence of significant pericolonic inflammatory changes. Vascular/Lymphatic: Aortic atherosclerosis. No enlarged abdominal or pelvic lymph nodes. Reproductive: Leiomyomatous uterus. Other: No abdominal wall hernia or abnormality. No abdominopelvic ascites. Musculoskeletal: Scoliosis and spondylosis of the spine. IMPRESSION: 1. Sub pathologic distension of the gallbladder measuring 4 cm transversely. No CT evidence of acute cholecystitis. 2. Diffuse circumferential wall thickening of the sigmoid colon, on the background of extensive diverticulosis, probable colitis. 3. Leiomyomatous uterus. 4. Scoliosis and spondylosis of the spine. 5. Aortic atherosclerosis. Aortic Atherosclerosis (ICD10-I70.0). Electronically Signed   By: Fidela Salisbury M.D.   On: 05/12/2021 16:22   DG Chest 2 View  Result Date: 05/12/2021 CLINICAL DATA:  Chest pain. Left center chest tightness for 2 years. History of atrial fibrillation. EXAM: CHEST - 2 VIEW COMPARISON:  Prior chest radiographs 02/27/2021 and  earlier. FINDINGS: Heart size at the upper limits of normal, unchanged. Aortic atherosclerosis. No appreciable airspace consolidation or pulmonary edema. No evidence of pleural effusion or pneumothorax. No acute bony abnormality identified. Bilateral glenohumeral joint osteoarthrosis. Degenerative changes of the spine. IMPRESSION: No evidence of acute cardiopulmonary abnormality. Aortic Atherosclerosis (ICD10-I70.0). Electronically Signed   By: Kellie Simmering DO   On: 05/12/2021 11:10   CT Head Wo Contrast  Result Date: 05/12/2021 CLINICAL DATA:  Head trauma, intracranial venous injury suspected. EXAM:  CT HEAD WITHOUT CONTRAST TECHNIQUE: Contiguous axial images were obtained from the base of the skull through the vertex without intravenous contrast. COMPARISON:  Brain MRI 02/27/2021. Prior head CT examinations 02/27/2021 and earlier. FINDINGS: Brain: Cerebral volume is normal for age. 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 Redemonstrated small chronic left thalamic lacunar infarct. There is no acute intracranial hemorrhage. No acute demarcated cortical infarct. No extra-axial fluid collection. No evidence of intracranial mass. No midline shift. Vascular: No hyperdense vessel.  Atherosclerotic calcifications. Skull: Normal. Negative for fracture or focal lesion. Sinuses/Orbits: Visualized orbits show no acute finding. Trace bilateral ethmoid sinus mucosal thickening. IMPRESSION: No evidence of acute intracranial abnormality. 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 Redemonstrated chronic left thalamic lacunar infarct. Electronically Signed   By: Kellie Simmering DO   On: 05/12/2021 16:23   US ABDOMEN LIMITED RUQ (LIVER/GB)  Result Date: 05/12/2021 CLINICAL DATA:  Right upper quadrant pain for 2 weeks, distended gallbladder on recent CT examination. EXAM: ULTRASOUND ABDOMEN LIMITED RIGHT UPPER QUADRANT COMPARISON:  CT from earlier in the same day. FINDINGS: Gallbladder: No gallstones or wall thickening visualized. No sonographic Murphy sign noted by sonographer. Gallbladder sludge is noted. Common bile duct: Diameter: 4.8 mm. Liver: Mildly increased in echogenicity consistent with fatty infiltration. No mass is noted. Portal vein is patent on color Doppler imaging with normal direction of blood flow towards the liver. Other: None. IMPRESSION: Fatty liver without focal mass. Gallbladder sludge. No complicating factors in the gallbladder are noted. Electronically Signed   By: Inez Catalina M.D.   On: 05/12/2021 17:29         Scheduled Meds:  amLODipine  10 mg Oral Daily   apixaban  5 mg Oral BID   atorvastatin  40 mg Oral Daily   citalopram  10 mg Oral Daily   levothyroxine  50 mcg Oral QAC breakfast   pantoprazole  40 mg Oral Daily   spironolactone  25 mg Oral Daily   traZODone  50 mg Oral QHS   Continuous Infusions:  dextrose 5 % and 0.9% NaCl 75 mL/hr at 05/13/21 1107   promethazine (PHENERGAN) injection (IM or IVPB)       LOS: 0 days    Time spent: 25 minutes    Sidney Ace, MD [facility] Pager [phone]  If 7PM-7AM, please contact night-coverage 05/13/2021, 1:20 PM

## 2021-05-13 NOTE — Care Management Obs Status (Signed)
[facility] NOTIFICATION   Patient Details  Name: Erin Sutton MRN: [id number] Date of Birth: August 28, 1941   Medicare Observation Status Notification Given:  Yes    Beverly Sessions, RN 05/13/2021, 4:32 PM
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
## 2021-05-14 DIAGNOSIS — Z7901 Long term (current) use of anticoagulants: Secondary | ICD-10-CM | POA: Diagnosis not present

## 2021-05-14 DIAGNOSIS — K529 Noninfective gastroenteritis and colitis, unspecified: Secondary | ICD-10-CM | POA: Diagnosis present

## 2021-05-14 DIAGNOSIS — F411 Generalized anxiety disorder: Secondary | ICD-10-CM | POA: Diagnosis not present

## 2021-05-14 DIAGNOSIS — R112 Nausea with vomiting, unspecified: Secondary | ICD-10-CM | POA: Diagnosis not present

## 2021-05-14 DIAGNOSIS — D649 Anemia, unspecified: Secondary | ICD-10-CM | POA: Diagnosis present

## 2021-05-14 DIAGNOSIS — I130 Hypertensive heart and chronic kidney disease with heart failure and stage 1 through stage 4 chronic kidney disease, or unspecified chronic kidney disease: Secondary | ICD-10-CM | POA: Diagnosis present

## 2021-05-14 DIAGNOSIS — F0390 Unspecified dementia without behavioral disturbance: Secondary | ICD-10-CM | POA: Diagnosis present

## 2021-05-14 DIAGNOSIS — I2699 Other pulmonary embolism without acute cor pulmonale: Secondary | ICD-10-CM | POA: Diagnosis not present

## 2021-05-14 DIAGNOSIS — E785 Hyperlipidemia, unspecified: Secondary | ICD-10-CM | POA: Diagnosis present

## 2021-05-14 DIAGNOSIS — Z86711 Personal history of pulmonary embolism: Secondary | ICD-10-CM | POA: Diagnosis not present

## 2021-05-14 DIAGNOSIS — F329 Major depressive disorder, single episode, unspecified: Secondary | ICD-10-CM | POA: Diagnosis present

## 2021-05-14 DIAGNOSIS — R0789 Other chest pain: Secondary | ICD-10-CM | POA: Diagnosis not present

## 2021-05-14 DIAGNOSIS — F419 Anxiety disorder, unspecified: Secondary | ICD-10-CM | POA: Diagnosis not present

## 2021-05-14 DIAGNOSIS — N189 Chronic kidney disease, unspecified: Secondary | ICD-10-CM | POA: Diagnosis present

## 2021-05-14 DIAGNOSIS — Z20822 Contact with and (suspected) exposure to covid-19: Secondary | ICD-10-CM | POA: Diagnosis present

## 2021-05-14 DIAGNOSIS — Z86718 Personal history of other venous thrombosis and embolism: Secondary | ICD-10-CM | POA: Diagnosis not present

## 2021-05-14 DIAGNOSIS — I160 Hypertensive urgency: Secondary | ICD-10-CM | POA: Diagnosis present

## 2021-05-14 DIAGNOSIS — Z9842 Cataract extraction status, left eye: Secondary | ICD-10-CM | POA: Diagnosis not present

## 2021-05-14 DIAGNOSIS — I4891 Unspecified atrial fibrillation: Secondary | ICD-10-CM | POA: Diagnosis present

## 2021-05-14 DIAGNOSIS — Z79899 Other long term (current) drug therapy: Secondary | ICD-10-CM | POA: Diagnosis not present

## 2021-05-14 DIAGNOSIS — F410 Panic disorder [episodic paroxysmal anxiety] without agoraphobia: Secondary | ICD-10-CM | POA: Diagnosis present

## 2021-05-14 DIAGNOSIS — Z6831 Body mass index (BMI) 31.0-31.9, adult: Secondary | ICD-10-CM | POA: Diagnosis not present

## 2021-05-14 DIAGNOSIS — E213 Hyperparathyroidism, unspecified: Secondary | ICD-10-CM | POA: Diagnosis present

## 2021-05-14 DIAGNOSIS — I5032 Chronic diastolic (congestive) heart failure: Secondary | ICD-10-CM | POA: Diagnosis present

## 2021-05-14 DIAGNOSIS — Z79890 Hormone replacement therapy (postmenopausal): Secondary | ICD-10-CM | POA: Diagnosis not present

## 2021-05-14 DIAGNOSIS — E876 Hypokalemia: Secondary | ICD-10-CM | POA: Diagnosis present

## 2021-05-14 DIAGNOSIS — E039 Hypothyroidism, unspecified: Secondary | ICD-10-CM | POA: Diagnosis present

## 2021-05-14 DIAGNOSIS — K5900 Constipation, unspecified: Secondary | ICD-10-CM | POA: Diagnosis not present

## 2021-05-14 DIAGNOSIS — K219 Gastro-esophageal reflux disease without esophagitis: Secondary | ICD-10-CM | POA: Diagnosis present

## 2021-05-14 MED ORDER — PANTOPRAZOLE SODIUM 40 MG PO TBEC
40.0000 mg | DELAYED_RELEASE_TABLET | Freq: Two times a day (BID) | ORAL | Status: DC
  Administered 2021-05-14 – 2021-05-20 (×12): 40 mg via ORAL
  Filled 2021-05-14 (×13): qty 1

## 2021-05-14 MED ORDER — ALUM & MAG HYDROXIDE-SIMETH 200-200-20 MG/5ML PO SUSP
30.0000 mL | ORAL | Status: DC | PRN
  Administered 2021-05-15: 30 mL via ORAL
  Filled 2021-05-14 (×2): qty 30

## 2021-05-14 MED ORDER — POLYVINYL ALCOHOL 1.4 % OP SOLN
1.0000 [drp] | OPHTHALMIC | Status: DC | PRN
  Administered 2021-05-14: 1 [drp] via OPHTHALMIC
  Filled 2021-05-14: qty 15

## 2021-05-14 MED ORDER — SIMETHICONE 80 MG PO CHEW
80.0000 mg | CHEWABLE_TABLET | Freq: Four times a day (QID) | ORAL | Status: DC
  Administered 2021-05-14 – 2021-05-20 (×17): 80 mg via ORAL
  Filled 2021-05-14 (×19): qty 1

## 2021-05-14 NOTE — Progress Notes (Signed)
PROGRESS NOTE    Erin Sutton  PYP:[id number] DOB: 10-Aug-1941 DOA: 05/12/2021 PCP: Baxter Hire, MD    Brief Narrative:  80 y.o. female with medical history significant for anxiety, depression, hypertension, hyperlipidemia, atrial fibrillation, history of left lower extremity DVT, history of bilateral pulmonary embolism, GERD, history of major depressive disorder, hypothyroid, mild to moderate dementia, constipation, presents to the emergency department for multiple concerns, chiefly nausea and vomiting.   She denies changes to diet. She reports the nausea and vomiting started 05/11/21. Her last PO intake was 05/10/21 and it was a baked potatoe that she made. She reports she has not been able to eat, keep anything down, including medications since 05/12/21 AM.  She reports that her vomitus is yellow and bitter and sour tasting.   She reports she took 2 doses of medication that is spelled 'ondans' without improvement, prompting her to present to the emergency department for further evaluation.  CT demonstrates evidence of mild colitis.  Patient remains hemodynamically stable but is not able to tolerate p.o. intake.  As of 6/21 patient still continues to endorse abdominal pain, nausea, inability to tolerate p.o.  She remains hemodynamically stable and afebrile   Assessment & Plan:   Principal Problem:   Intractable vomiting with nausea Active Problems:   Hypothyroidism   HTN (hypertension)   GERD (gastroesophageal reflux disease)   Anxiety   Bilateral pulmonary embolism ([facility])   Hyperlipidemia   Sleep apnea   Dementia ([facility])   Left leg DVT ([facility])   Major depressive disorder, single episode   Constipation   Hyperparathyroidism ([facility])   Gastroenteritis  Intractable nausea and vomiting CT read of colitis Possible differentials include infectious colitis, mild ischemic colitis.  Patient is afebrile, normal white blood cell count, negative procalcitonin. Possible viral  gastroenteritis Hemodynamically stable but not tolerating p.o. with poor appetite 6/21: Patient continues to endorse nausea requiring IV antiemetics.  Inability to tolerate p.o. Plan: Continue IV fluids for today As needed antiemetics Continue full liquid diet Add simethicone 4 times daily Twice daily PPI Continue full liquid diet Advance as tolerated Discharge within 24 to 48 hours   Hypertension Hypertensive urgency noted in ED Resolved with 1 dose of IV labetalol Pressures are improved Patient states she does not take carvedilol Carvedilol removed from home MAR Plan: Continue Aldactone 25 daily Continue amlodipine 10 daily    History of bilateral pulmonary embolism, left lower extremity DVT, and atrial fibrillation - Resumed Eliquis 5 mg p.o. twice daily   Hyperlipidemia atorvastatin 40 mg nightly    Hypothyroid levothyroxine 50 mcg daily    Anxiety/panic attacks home Ativan 0.5 mg p.o. 3 times daily as needed   GERD PPI daily    Chronic heart failure preserved ejection fraction, grade 1 diastolic dysfunction - Moderate LVH - Appears euvolemic and compensated at this time    DVT prophylaxis: Eliquis Code Status: Full Family Communication: Daughter [name] [phone] on 6/20, left VM on 6/21 Disposition Plan: Status is: Observation  The patient will require care spanning > 2 midnights and should be moved to inpatient because: IV treatments appropriate due to intensity of illness or inability to take PO and Inpatient level of care appropriate due to severity of illness  Dispo: The patient is from: Home              Anticipated d/c is to: Home              Patient currently is not medically stable to d/c.  Difficult to place patient No  Mild colitis on CT.  Patient cannot tolerate p.o. intake and has persistent nausea.  Not appropriate for disposition discharge at this time           Level of care: Med-Surg  Consultants:   None  Procedures:  None  Antimicrobials:  None   Subjective: Patient seen and examined.  Endorses burning epigastric pain and poor p.o. intake secondary to nausea  Objective: Vitals:   05/13/21 2314 05/14/21 0412 05/14/21 0805 05/14/21 1118  BP: (!) 103/58 (!) 113/58 (!) 155/84 131/70  Pulse: (!) 54 (!) 53 67 65  Resp: 18 20 18 16   Temp: 97.8 F (36.6 C) 98.7 F (37.1 C) 98.4 F (36.9 C) 98.4 F (36.9 C)  TempSrc: Oral  Oral Oral  SpO2:  98% 98% 97%  Weight:      Height:        Intake/Output Summary (Last 24 hours) at 05/14/2021 1153 Last data filed at 05/14/2021 0900 Gross per 24 hour  Intake 2602.97 ml  Output 1700 ml  Net 902.97 ml   Filed Weights   05/12/21 1020 05/12/21 2354  Weight: 74.4 kg 72.1 kg    Examination:  General exam: No acute distress.  Appears fatigued Respiratory system: Clear to auscultation. Respiratory effort normal. Cardiovascular system: S1 & S2 heard, RRR. No JVD, murmurs, rubs, gallops or clicks. No pedal edema. Gastrointestinal system: Soft, nondistended, tender to palpation, normal bowel sounds Central nervous system: Alert and oriented. No focal neurological deficits. Extremities: Symmetric 5 x 5 power. Skin: No rashes, lesions or ulcers Psychiatry: Judgement and insight appear normal. Mood & affect appropriate.     Data Reviewed: I have personally reviewed following labs and imaging studies  CBC: Recent Labs  Lab 05/12/21 1031 05/13/21 0414  WBC 8.0 7.3  HGB 13.5 11.8*  HCT 40.9 35.0*  MCV 89.3 89.3  PLT 311 361   Basic Metabolic Panel: Recent Labs  Lab 05/12/21 1031 05/13/21 0414  NA 135 136  K 4.1 3.6  CL 99 103  CO2 25 26  GLUCOSE 111* 91  BUN 8 9  CREATININE 0.85 0.88  CALCIUM 10.1 8.9   GFR: Estimated Creatinine Clearance: 45.9 mL/min (by C-G formula based on SCr of 0.88 mg/dL). Liver Function Tests: Recent Labs  Lab 05/12/21 1546  AST 50*  ALT 40  ALKPHOS 79  BILITOT 0.9  PROT 7.8  ALBUMIN  4.5   Recent Labs  Lab 05/12/21 1546  LIPASE 27   No results for input(s): AMMONIA in the last 168 hours. Coagulation Profile: No results for input(s): INR, PROTIME in the last 168 hours. Cardiac Enzymes: No results for input(s): CKTOTAL, CKMB, CKMBINDEX, TROPONINI in the last 168 hours. BNP (last 3 results) No results for input(s): PROBNP in the last 8760 hours. HbA1C: No results for input(s): HGBA1C in the last 72 hours. CBG: Recent Labs  Lab 05/12/21 1757  GLUCAP 116*   Lipid Profile: No results for input(s): CHOL, HDL, LDLCALC, TRIG, CHOLHDL, LDLDIRECT in the last 72 hours. Thyroid Function Tests: No results for input(s): TSH, T4TOTAL, FREET4, T3FREE, THYROIDAB in the last 72 hours. Anemia Panel: No results for input(s): VITAMINB12, FOLATE, FERRITIN, TIBC, IRON, RETICCTPCT in the last 72 hours. Sepsis Labs: Recent Labs  Lab 05/13/21 0414  PROCALCITON <0.10    Recent Results (from the past 240 hour(s))  Resp Panel by RT-PCR (Flu A&B, Covid) Nasopharyngeal Swab     Status: None   Collection Time: 05/12/21  3:46  PM   Specimen: Nasopharyngeal Swab; Nasopharyngeal(NP) swabs in vial transport medium  Result Value Ref Range Status   SARS Coronavirus 2 by RT PCR NEGATIVE NEGATIVE Final    Comment: (NOTE) SARS-CoV-2 target nucleic acids are NOT DETECTED.  The SARS-CoV-2 RNA is generally detectable in upper respiratory specimens during the acute phase of infection. The lowest concentration of SARS-CoV-2 viral copies this assay can detect is 138 copies/mL. A negative result does not preclude SARS-Cov-2 infection and should not be used as the sole basis for treatment or other patient management decisions. A negative result may occur with  improper specimen collection/handling, submission of specimen other than nasopharyngeal swab, presence of viral mutation(s) within the areas targeted by this assay, and inadequate number of viral copies(<138 copies/mL). A negative result  must be combined with clinical observations, patient history, and epidemiological information. The expected result is Negative.  Fact Sheet for Patients:  EntrepreneurPulse.com.au  Fact Sheet for Healthcare Providers:  IncredibleEmployment.be  This test is no t yet approved or cleared by the [place] FDA and  has been authorized for detection and/or diagnosis of SARS-CoV-2 by FDA under an Emergency Use Authorization (EUA). This EUA will remain  in effect (meaning this test can be used) for the duration of the COVID-19 declaration under Section 564(b)(1) of the Act, 21 U.S.C.section 360bbb-3(b)(1), unless the authorization is terminated  or revoked sooner.       Influenza A by PCR NEGATIVE NEGATIVE Final   Influenza B by PCR NEGATIVE NEGATIVE Final    Comment: (NOTE) The Xpert Xpress SARS-CoV-2/FLU/RSV plus assay is intended as an aid in the diagnosis of influenza from Nasopharyngeal swab specimens and should not be used as a sole basis for treatment. Nasal washings and aspirates are unacceptable for Xpert Xpress SARS-CoV-2/FLU/RSV testing.  Fact Sheet for Patients: EntrepreneurPulse.com.au  Fact Sheet for Healthcare Providers: IncredibleEmployment.be  This test is not yet approved or cleared by the [place] FDA and has been authorized for detection and/or diagnosis of SARS-CoV-2 by FDA under an Emergency Use Authorization (EUA). This EUA will remain in effect (meaning this test can be used) for the duration of the COVID-19 declaration under Section 564(b)(1) of the Act, 21 U.S.C. section 360bbb-3(b)(1), unless the authorization is terminated or revoked.  Performed at [facility], [address] [place],  [address]          Radiology Studies: CT ABDOMEN PELVIS WO CONTRAST  Result Date: 05/12/2021 CLINICAL DATA:  Nausea vomiting. EXAM: CT ABDOMEN AND PELVIS WITHOUT  CONTRAST TECHNIQUE: Multidetector CT imaging of the abdomen and pelvis was performed following the standard protocol without IV contrast. COMPARISON:  Apr 13, 2020 FINDINGS: Lower chest: No acute abnormality. Hepatobiliary: Normal appearance of the liver. Sub pathologic distension of the gallbladder measuring 4 cm transversely. Pancreas: Unremarkable. No pancreatic ductal dilatation or surrounding inflammatory changes. Spleen: Normal in size without focal abnormality. Adrenals/Urinary Tract: Adrenal glands are unremarkable. Kidneys are normal, without renal calculi, focal lesion, or hydronephrosis. Bladder is unremarkable. Stomach/Bowel: Stomach is within normal limits. Appendix appears normal. No evidence of small bowel wall thickening, distention, or inflammatory changes. Diffuse circumferential wall thickening of the sigmoid colon, on the background of extensive diverticulosis. No evidence of significant pericolonic inflammatory changes. Vascular/Lymphatic: Aortic atherosclerosis. No enlarged abdominal or pelvic lymph nodes. Reproductive: Leiomyomatous uterus. Other: No abdominal wall hernia or abnormality. No abdominopelvic ascites. Musculoskeletal: Scoliosis and spondylosis of the spine. IMPRESSION: 1. Sub pathologic distension of the gallbladder measuring 4 cm transversely. No CT  evidence of acute cholecystitis. 2. Diffuse circumferential wall thickening of the sigmoid colon, on the background of extensive diverticulosis, probable colitis. 3. Leiomyomatous uterus. 4. Scoliosis and spondylosis of the spine. 5. Aortic atherosclerosis. Aortic Atherosclerosis (ICD10-I70.0). Electronically Signed   By: Fidela Salisbury M.D.   On: 05/12/2021 16:22   CT Head Wo Contrast  Result Date: 05/12/2021 CLINICAL DATA:  Head trauma, intracranial venous injury suspected. EXAM: CT HEAD WITHOUT CONTRAST TECHNIQUE: Contiguous axial images were obtained from the base of the skull through the vertex without intravenous  contrast. COMPARISON:  Brain MRI 02/27/2021. Prior head CT examinations 02/27/2021 and earlier. FINDINGS: Brain: Cerebral volume is normal for age. 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 Redemonstrated small chronic left thalamic lacunar infarct. There is no acute intracranial hemorrhage. No acute demarcated cortical infarct. No extra-axial fluid collection. No evidence of intracranial mass. No midline shift. Vascular: No hyperdense vessel.  Atherosclerotic calcifications. Skull: Normal. Negative for fracture or focal lesion. Sinuses/Orbits: Visualized orbits show no acute finding. Trace bilateral ethmoid sinus mucosal thickening. IMPRESSION: No evidence of acute intracranial abnormality. 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 Redemonstrated chronic left thalamic lacunar infarct. Electronically Signed   By: Kellie Simmering DO   On: 05/12/2021 16:23   US ABDOMEN LIMITED RUQ (LIVER/GB)  Result Date: 05/12/2021 CLINICAL DATA:  Right upper quadrant pain for 2 weeks, distended gallbladder on recent CT examination. EXAM: ULTRASOUND ABDOMEN LIMITED RIGHT UPPER QUADRANT COMPARISON:  CT from earlier in the same day. FINDINGS: Gallbladder: No gallstones or wall thickening visualized. No sonographic Murphy sign noted by sonographer. Gallbladder sludge is noted. Common bile duct: Diameter: 4.8 mm. Liver: Mildly increased in echogenicity consistent with fatty infiltration. No mass is noted. Portal vein is patent on color Doppler imaging with normal direction of blood flow towards the liver. Other: None. IMPRESSION: Fatty liver without focal mass. Gallbladder sludge. No complicating factors in the gallbladder are noted. Electronically Signed   By: Inez Catalina M.D.   On: 05/12/2021 17:29        Scheduled Meds:  amLODipine  10 mg Oral Daily   apixaban  5 mg Oral BID   atorvastatin  40 mg Oral Daily   citalopram  10 mg Oral  Daily   levothyroxine  50 mcg Oral QAC breakfast   pantoprazole  40 mg Oral BID   simethicone  80 mg Oral QID   traZODone  50 mg Oral QHS   Continuous Infusions:  dextrose 5 % and 0.9% NaCl 100 mL/hr at 05/14/21 1030   promethazine (PHENERGAN) injection (IM or IVPB)       LOS: 0 days    Time spent: 25 minutes    Sidney Ace, MD [facility] Pager [phone]  If 7PM-7AM, please contact night-coverage 05/14/2021, 11:53 AM

## 2021-05-14 NOTE — Evaluation (Signed)
Physical Therapy Evaluation Patient Details Name: Erin Sutton MRN: [id number] DOB: [birth year] Today's Date: 05/14/2021   History of Present Illness  Pt is a 80 y/o F with PMH: HTN, hypothyroidism, and OSA who presented to ED d/t nausea and headache. Pt adm under observation d/t intractable nausea and vomiting with poor PO intake. W/u included CT positive for colitis.  Clinical Impression  Patient received in bed, continues to report nausea. Unable to eat breakfast. Patient agreeable to PT assessment. She is mod independent with bed mobility, transfers with min assist. Posterior leaning initially with standing. Patient ambulated 5 feet with RW and min guard. Antalgic gait pattern due to right knee pain which is chronic. Patient will continue to benefit from skilled PT while here to improve functional independence and strength for safe return home.      Follow Up Recommendations Home health PT    Equipment Recommendations  None recommended by PT    Recommendations for Other Services       Precautions / Restrictions Precautions Precaution Comments: mod fall Restrictions Weight Bearing Restrictions: No      Mobility  Bed Mobility Overal bed mobility: Modified Independent Bed Mobility: Supine to Sit     Supine to sit: Modified independent (Device/Increase time)     General bed mobility comments: patient requires increased time, bed rails.    Transfers Overall transfer level: Needs assistance Equipment used: Rolling walker (2 wheeled) Transfers: Sit to/from Stand Sit to Stand: Min assist         General transfer comment: posterior leaning with initial standing.  Ambulation/Gait Ambulation/Gait assistance: Min guard Gait Distance (Feet): 5 Feet Assistive device: Rolling walker (2 wheeled) Gait Pattern/deviations: Step-to pattern;Decreased step length - right;Decreased step length - left;Decreased weight shift to right;Antalgic Gait velocity: decr   General Gait  Details: patient requires min guard for ambulation of 5 feet. Limited by nausea, and R knee pain  Stairs            Wheelchair Mobility    Modified Rankin (Stroke Patients Only)       Balance Overall balance assessment: Needs assistance Sitting-balance support: Feet supported Sitting balance-Leahy Scale: Fair     Standing balance support: Bilateral upper extremity supported;During functional activity Standing balance-Leahy Scale: Fair Standing balance comment: reliant on B UE support and min guard for safety                             Pertinent Vitals/Pain Pain Assessment: Faces Faces Pain Scale: Hurts a little bit Pain Location: Chronic right knee pain. States she needs a knee replacement Pain Descriptors / Indicators: Discomfort;Sore Pain Intervention(s): Monitored during session    Home Living Family/patient expects to be discharged to:: Private residence Living Arrangements: Alone Available Help at Discharge: Friend(s);Available PRN/intermittently Type of Home: Mobile home Home Access: Stairs to enter Entrance Stairs-Rails: Right Entrance Stairs-Number of Steps: 3 Home Layout: One level Home Equipment: Walker - 2 wheels;Cane - single point;Bedside commode      Prior Function Level of Independence: Independent with assistive device(s)         Comments: Pt reports driving short distances for necessities like to grocery and appts. States she is able to perform all self care I'ly and uses RW for fxl mobility in the home and community. [employer] friends help with meals/errands as needed when patient is sick.     Hand Dominance   Dominant Hand: Right    Extremity/Trunk Assessment  Upper Extremity Assessment Upper Extremity Assessment: Generalized weakness    Lower Extremity Assessment Lower Extremity Assessment: Generalized weakness    Cervical / Trunk Assessment Cervical / Trunk Assessment: Normal  Communication   Communication: No  difficulties  Cognition Arousal/Alertness: Awake/alert Behavior During Therapy: WFL for tasks assessed/performed Overall Cognitive Status: Within Functional Limits for tasks assessed                                        General Comments      Exercises     Assessment/Plan    PT Assessment Patient needs continued PT services  PT Problem List Decreased strength;Decreased mobility;Decreased activity tolerance;Decreased balance;Pain       PT Treatment Interventions DME instruction;Therapeutic exercise;Gait training;Balance training;Stair training;Functional mobility training;Therapeutic activities;Patient/family education    PT Goals (Current goals can be found in the Care Plan section)  Acute Rehab PT Goals Patient Stated Goal: to return home with HHPT, to feel better PT Goal Formulation: With patient Time For Goal Achievement: 05/21/21 Potential to Achieve Goals: Good    Frequency Min 2X/week   Barriers to discharge Decreased caregiver support      Co-evaluation               AM-PAC PT "6 Clicks" Mobility  Outcome Measure Help needed turning from your back to your side while in a flat bed without using bedrails?: None Help needed moving from lying on your back to sitting on the side of a flat bed without using bedrails?: A Little Help needed moving to and from a bed to a chair (including a wheelchair)?: A Little Help needed standing up from a chair using your arms (e.g., wheelchair or bedside chair)?: A Little Help needed to walk in hospital room?: A Little Help needed climbing 3-5 steps with a railing? : A Little 6 Click Score: 19    End of Session Equipment Utilized During Treatment: Gait belt Activity Tolerance: Patient limited by pain Patient left: in chair;with call bell/phone within reach Nurse Communication: Mobility status PT Visit Diagnosis: Unsteadiness on feet (R26.81);Difficulty in walking, not elsewhere classified (R26.2);Muscle  weakness (generalized) (M62.81)    Time: 0272-5366 PT Time Calculation (min) (ACUTE ONLY): 20 min   Charges:   PT Evaluation $PT Eval Moderate Complexity: 1 Mod PT Treatments $Gait Training: 8-22 mins        Mayumi Summerson, PT, GCS 05/14/21,9:43 AM

## 2021-05-15 ENCOUNTER — Inpatient Hospital Stay: Payer: Medicare HMO

## 2021-05-15 DIAGNOSIS — K529 Noninfective gastroenteritis and colitis, unspecified: Principal | ICD-10-CM

## 2021-05-15 DIAGNOSIS — I2699 Other pulmonary embolism without acute cor pulmonale: Secondary | ICD-10-CM

## 2021-05-15 DIAGNOSIS — K5900 Constipation, unspecified: Secondary | ICD-10-CM

## 2021-05-15 DIAGNOSIS — R112 Nausea with vomiting, unspecified: Secondary | ICD-10-CM

## 2021-05-15 DIAGNOSIS — F0390 Unspecified dementia without behavioral disturbance: Secondary | ICD-10-CM

## 2021-05-15 DIAGNOSIS — K219 Gastro-esophageal reflux disease without esophagitis: Secondary | ICD-10-CM

## 2021-05-15 LAB — CBC WITH DIFFERENTIAL/PLATELET
Abs Immature Granulocytes: 0.02 10*3/uL (ref 0.00–0.07)
Basophils Absolute: 0.1 10*3/uL (ref 0.0–0.1)
Basophils Relative: 1 %
Eosinophils Absolute: 0.2 10*3/uL (ref 0.0–0.5)
Eosinophils Relative: 3 %
HCT: 37.3 % (ref 36.0–46.0)
Hemoglobin: 12.3 g/dL (ref 12.0–15.0)
Immature Granulocytes: 0 %
Lymphocytes Relative: 11 %
Lymphs Abs: 0.8 10*3/uL (ref 0.7–4.0)
MCH: 30.3 pg (ref 26.0–34.0)
MCHC: 33 g/dL (ref 30.0–36.0)
MCV: 91.9 fL (ref 80.0–100.0)
Monocytes Absolute: 0.5 10*3/uL (ref 0.1–1.0)
Monocytes Relative: 8 %
Neutro Abs: 5.2 10*3/uL (ref 1.7–7.7)
Neutrophils Relative %: 77 %
Platelets: 247 10*3/uL (ref 150–400)
RBC: 4.06 MIL/uL (ref 3.87–5.11)
RDW: 14.3 % (ref 11.5–15.5)
WBC: 6.7 10*3/uL (ref 4.0–10.5)
nRBC: 0 % (ref 0.0–0.2)

## 2021-05-15 LAB — BASIC METABOLIC PANEL
Anion gap: 7 (ref 5–15)
BUN: <5 mg/dL — ABNORMAL LOW (ref 8–23)
CO2: 26 mmol/L (ref 22–32)
Calcium: 8.9 mg/dL (ref 8.9–10.3)
Chloride: 106 mmol/L (ref 98–111)
Creatinine, Ser: 0.79 mg/dL (ref 0.44–1.00)
GFR, Estimated: >60 mL/min (ref >60)
Glucose, Bld: 119 mg/dL — ABNORMAL HIGH (ref 70–99)
Potassium: 3.3 mmol/L — ABNORMAL LOW (ref 3.5–5.1)
Sodium: 139 mmol/L (ref 135–145)

## 2021-05-15 MED ORDER — SENNOSIDES-DOCUSATE SODIUM 8.6-50 MG PO TABS
1.0000 | ORAL_TABLET | Freq: Two times a day (BID) | ORAL | Status: DC
  Administered 2021-05-15 – 2021-05-20 (×10): 1 via ORAL
  Filled 2021-05-15 (×10): qty 1

## 2021-05-15 MED ORDER — POLYETHYLENE GLYCOL 3350 17 G PO PACK
17.0000 g | PACK | Freq: Every day | ORAL | Status: DC
  Administered 2021-05-15 – 2021-05-20 (×6): 17 g via ORAL
  Filled 2021-05-15 (×6): qty 1

## 2021-05-15 MED ORDER — POTASSIUM CHLORIDE 10 MEQ/100ML IV SOLN
10.0000 meq | INTRAVENOUS | Status: AC
  Administered 2021-05-15 (×4): 10 meq via INTRAVENOUS
  Filled 2021-05-15: qty 100

## 2021-05-15 MED ORDER — DICLOFENAC SODIUM 1 % EX GEL
2.0000 g | Freq: Four times a day (QID) | CUTANEOUS | Status: DC | PRN
  Filled 2021-05-15: qty 100

## 2021-05-15 MED ORDER — MORPHINE SULFATE (PF) 2 MG/ML IV SOLN: 2.0000 mg | INTRAVENOUS | Status: DC | PRN

## 2021-05-15 MED ORDER — HYDROCODONE-ACETAMINOPHEN 5-325 MG PO TABS
1.0000 | ORAL_TABLET | Freq: Four times a day (QID) | ORAL | Status: DC | PRN
  Administered 2021-05-15 – 2021-05-17 (×2): 1 via ORAL
  Filled 2021-05-15 (×2): qty 1

## 2021-05-15 NOTE — Progress Notes (Signed)
PROGRESS NOTE    Erin Sutton  TSV:[id number] DOB: Sep 26, 1941 DOA: 05/12/2021 PCP: Baxter Hire, MD    Brief Narrative:  80 y.o. female with medical history significant for anxiety, depression, hypertension, hyperlipidemia, atrial fibrillation, history of left lower extremity DVT, history of bilateral pulmonary embolism, GERD, history of major depressive disorder, hypothyroid, mild to moderate dementia, constipation, presents to the ED for multiple concerns, chiefly nausea and vomiting. Her last PO intake was 05/10/21 and it was a baked potatoe that she made. She reports she has not been able to eat, keep anything down, including medications since 05/12/21 AM.  She reports that her vomitus is yellow and bitter and sour tasting. CT demonstrates evidence of mild colitis.  Patient remains hemodynamically stable but is not able to tolerate p.o. intake. Pt admitted for further management.    Today, patient continues to complain of nausea, with about 2 episodes of none bloody vomiting, still with generalized abdominal tenderness.  Denies any chest pain, shortness of breath, fever/chills.   Assessment & Plan:   Principal Problem:   Intractable vomiting with nausea Active Problems:   Hypothyroidism   HTN (hypertension)   GERD (gastroesophageal reflux disease)   Anxiety   Intractable nausea and vomiting   Bilateral pulmonary embolism (HCC)   Hyperlipidemia   Sleep apnea   Dementia (HCC)   Left leg DVT (HCC)   Major depressive disorder, single episode   Constipation   Hyperparathyroidism (HCC)   Gastroenteritis   Intractable nausea and vomiting Possible differentials include infectious colitis, mild ischemic colitis Currently afebrile, with no leukocytosis Labs unremarkable CT abdomen pelvis, showing possible colitis Abdominal x-ray pending Reluctant to start antibiotics due to patient being afebrile, with no leukocytosis, but if symptoms persist, may need to start antibiotics to  see if it helps Continue IV fluids, antiemetics, simethicone, PPI Continue full liquid diet, plan to advance Monitor closely  Hypokalemia Replace as needed   Hypertension BP stable Continue Aldactone 25 daily, amlodipine 10 daily  History of bilateral pulmonary embolism, left lower extremity DVT, and atrial fibrillation Continue Eliquis 5 mg p.o. twice daily   Hypothyroid levothyroxine 50 mcg daily    Anxiety/panic attacks home Ativan 0.5 mg p.o. 3 times daily as needed   GERD PPI daily    Chronic heart failure preserved ejection fraction, grade 1 diastolic dysfunction Moderate LVH Appears euvolemic and compensated at this time    DVT prophylaxis: Eliquis Code Status: Full Family Communication: None at bedside Disposition Plan: Status is: Inpatient  Inpatient because: IV treatments appropriate due to intensity of illness or inability to take PO and Inpatient level of care appropriate due to severity of illness  Dispo: The patient is from: Home              Anticipated d/c is to: Home              Patient currently is not medically stable to d/c.   Difficult to place patient No   Consultants:  None  Procedures:  None  Antimicrobials:  None   Objective: Vitals:   05/14/21 2301 05/15/21 0330 05/15/21 0738 05/15/21 1132  BP: (!) 105/48 (!) 109/57 (!) 150/69 135/81  Pulse: 60 (!) 55 84 69  Resp: 18 18 18 16   Temp: 98.5 F (36.9 C) 98.4 F (36.9 C) 97.9 F (36.6 C) 98.5 F (36.9 C)  TempSrc: Oral Oral  Oral  SpO2: 97% 99% 100% 96%  Weight:      Height:  Intake/Output Summary (Last 24 hours) at 05/15/2021 1507 Last data filed at 05/15/2021 1138 Gross per 24 hour  Intake 1900.87 ml  Output 2575 ml  Net -674.13 ml   Filed Weights   05/12/21 1020 05/12/21 2354  Weight: 74.4 kg 72.1 kg    Examination: General: NAD, appears acutely ill Cardiovascular: S1, S2 present Respiratory: CTAB Abdomen: Soft, +tender, nondistended, bowel sounds  present Musculoskeletal: No bilateral pedal edema noted Skin: Normal Psychiatry: Normal mood      Data Reviewed: I have personally reviewed following labs and imaging studies  CBC: Recent Labs  Lab 05/12/21 1031 05/13/21 0414 05/15/21 1002  WBC 8.0 7.3 6.7  NEUTROABS  --   --  5.2  HGB 13.5 11.8* 12.3  HCT 40.9 35.0* 37.3  MCV 89.3 89.3 91.9  PLT 311 279 160   Basic Metabolic Panel: Recent Labs  Lab 05/12/21 1031 05/13/21 0414 05/15/21 1002  NA 135 136 139  K 4.1 3.6 3.3*  CL 99 103 106  CO2 25 26 26   GLUCOSE 111* 91 119*  BUN 8 9 <5*  CREATININE 0.85 0.88 0.79  CALCIUM 10.1 8.9 8.9   GFR: Estimated Creatinine Clearance: 50.5 mL/min (by C-G formula based on SCr of 0.79 mg/dL). Liver Function Tests: Recent Labs  Lab 05/12/21 1546  AST 50*  ALT 40  ALKPHOS 79  BILITOT 0.9  PROT 7.8  ALBUMIN 4.5   Recent Labs  Lab 05/12/21 1546  LIPASE 27   No results for input(s): AMMONIA in the last 168 hours. Coagulation Profile: No results for input(s): INR, PROTIME in the last 168 hours. Cardiac Enzymes: No results for input(s): CKTOTAL, CKMB, CKMBINDEX, TROPONINI in the last 168 hours. BNP (last 3 results) No results for input(s): PROBNP in the last 8760 hours. HbA1C: No results for input(s): HGBA1C in the last 72 hours. CBG: Recent Labs  Lab 05/12/21 1757  GLUCAP 116*   Lipid Profile: No results for input(s): CHOL, HDL, LDLCALC, TRIG, CHOLHDL, LDLDIRECT in the last 72 hours. Thyroid Function Tests: No results for input(s): TSH, T4TOTAL, FREET4, T3FREE, THYROIDAB in the last 72 hours. Anemia Panel: No results for input(s): VITAMINB12, FOLATE, FERRITIN, TIBC, IRON, RETICCTPCT in the last 72 hours. Sepsis Labs: Recent Labs  Lab 05/13/21 0414  PROCALCITON <0.10    Recent Results (from the past 240 hour(s))  Resp Panel by RT-PCR (Flu A&B, Covid) Nasopharyngeal Swab     Status: None   Collection Time: 05/12/21  3:46 PM   Specimen: Nasopharyngeal  Swab; Nasopharyngeal(NP) swabs in vial transport medium  Result Value Ref Range Status   SARS Coronavirus 2 by RT PCR NEGATIVE NEGATIVE Final    Comment: (NOTE) SARS-CoV-2 target nucleic acids are NOT DETECTED.  The SARS-CoV-2 RNA is generally detectable in upper respiratory specimens during the acute phase of infection. The lowest concentration of SARS-CoV-2 viral copies this assay can detect is 138 copies/mL. A negative result does not preclude SARS-Cov-2 infection and should not be used as the sole basis for treatment or other patient management decisions. A negative result may occur with  improper specimen collection/handling, submission of specimen other than nasopharyngeal swab, presence of viral mutation(s) within the areas targeted by this assay, and inadequate number of viral copies(<138 copies/mL). A negative result must be combined with clinical observations, patient history, and epidemiological information. The expected result is Negative.  Fact Sheet for Patients:  EntrepreneurPulse.com.au  Fact Sheet for Healthcare Providers:  IncredibleEmployment.be  This test is no t yet approved or cleared  by the [place] and  has been authorized for detection and/or diagnosis of SARS-CoV-2 by FDA under an Emergency Use Authorization (EUA). This EUA will remain  in effect (meaning this test can be used) for the duration of the COVID-19 declaration under Section 564(b)(1) of the Act, 21 U.S.C.section 360bbb-3(b)(1), unless the authorization is terminated  or revoked sooner.       Influenza A by PCR NEGATIVE NEGATIVE Final   Influenza B by PCR NEGATIVE NEGATIVE Final    Comment: (NOTE) The Xpert Xpress SARS-CoV-2/FLU/RSV plus assay is intended as an aid in the diagnosis of influenza from Nasopharyngeal swab specimens and should not be used as a sole basis for treatment. Nasal washings and aspirates are unacceptable for Xpert Xpress  SARS-CoV-2/FLU/RSV testing.  Fact Sheet for Patients: EntrepreneurPulse.com.au  Fact Sheet for Healthcare Providers: IncredibleEmployment.be  This test is not yet approved or cleared by the [place] FDA and has been authorized for detection and/or diagnosis of SARS-CoV-2 by FDA under an Emergency Use Authorization (EUA). This EUA will remain in effect (meaning this test can be used) for the duration of the COVID-19 declaration under Section 564(b)(1) of the Act, 21 U.S.C. section 360bbb-3(b)(1), unless the authorization is terminated or revoked.  Performed at [facility], [address] [place], [place] [address]          Radiology Studies: No results found.      Scheduled Meds:  amLODipine  10 mg Oral Daily   apixaban  5 mg Oral BID   atorvastatin  40 mg Oral Daily   citalopram  10 mg Oral Daily   levothyroxine  50 mcg Oral QAC breakfast   pantoprazole  40 mg Oral BID   simethicone  80 mg Oral QID   traZODone  50 mg Oral QHS   Continuous Infusions:  dextrose 5 % and 0.9% NaCl 100 mL/hr at 05/15/21 0935   promethazine (PHENERGAN) injection (IM or IVPB)       LOS: 1 day     Alma Friendly, MD [facility]  If 7PM-7AM, please contact night-coverage 05/15/2021, 3:07 PM

## 2021-05-15 NOTE — Progress Notes (Signed)
Patient refused CPAP machine for the night.  Patient wearing 2LPM oxygen via nasal cannula while she sleeps.

## 2021-05-15 NOTE — Progress Notes (Signed)
PT Cancellation Note  Patient Details Name: Erin Sutton MRN: [id number] DOB: Aug 28, 1941   Cancelled Treatment:    Reason Eval/Treat Not Completed: Fatigue/lethargy limiting ability to participate. Patient sleeping on arrival, wakes to my voice. Declines PT at this time due to nausea and fatigue. Will re-attempt PT tomorrow.      Mairen Wallenstein 05/15/2021, 3:27 PM

## 2021-05-15 NOTE — Progress Notes (Signed)
Occupational Therapy Treatment Patient Details Name: Erin Sutton MRN: [id number] DOB: 1941-06-16 Today's Date: 05/15/2021    History of present illness Pt is a 80 y/o F with PMH: HTN, hypothyroidism, and OSA who presented to ED d/t nausea and headache. Pt adm under observation d/t intractable nausea and vomiting with poor PO intake. W/u included CT positive for colitis.   OT comments  Pt seen for OT tx this date to f/u re: safety with ADLs/ADL mobility. Pt presents this date with ongoing c/o nausea. Requires gentle encouragement to engage with OT on some level this date to better assess ADL performance and safety as pt wants to return home upon d/c. Pt able to perform sup to sit with MOD I with use of railing and HOB elevated. Demos G static sitting balance. Pt requires SETUP for UB g/h and bathing tasks while in unsupported sitting and requires MOD A for LB bathing in standing with 2WW for balance/support. Pt requires MIN A for STS And CGA for fxl mobility as well as 1 standing rest break. Pt returned to bed with SUPV and increased time to manage BLE. Pt left in bed with all needs met and in reach. Will continue to follow. Continue to anticipate that pt will be able to return home, with HHOT And the caveat that she will need to be seen safely ascending steps and have community support for IADLs such as meals.    Follow Up Recommendations  Home health OT    Equipment Recommendations  3 in 1 bedside commode;Tub/shower seat    Recommendations for Other Services      Precautions / Restrictions Precautions Precautions: Fall Precaution Comments: mod fall Restrictions Weight Bearing Restrictions: No       Mobility Bed Mobility Overal bed mobility: Modified Independent Bed Mobility: Supine to Sit;Sit to Supine     Supine to sit: Modified independent (Device/Increase time);HOB elevated Sit to supine: Supervision;HOB elevated   General bed mobility comments: increased time for LEs  back to bed, but pt able to perform w/o physical assistance    Transfers Overall transfer level: Needs assistance Equipment used: Rolling walker (2 wheeled) Transfers: Sit to/from Stand Sit to Stand: Min guard;Supervision         General transfer comment: improved control for CTS today with MIN cues for hand placement/sequence with use of 2WW as pt typically uses rollator.    Balance Overall balance assessment: Needs assistance Sitting-balance support: Feet supported Sitting balance-Leahy Scale: Good Sitting balance - Comments: G static w/o UE support   Standing balance support: Bilateral upper extremity supported;During functional activity Standing balance-Leahy Scale: Fair Standing balance comment: UE support on RW, able to alterante in static standing for LB ADL participation                           ADL either performed or assessed with clinical judgement   ADL Overall ADL's : Needs assistance/impaired     Grooming: Wash/dry face;Oral care;Set up;Sitting Grooming Details (indicate cue type and reason): EOB unsupported sitting Upper Body Bathing: Set up;Sitting Upper Body Bathing Details (indicate cue type and reason): from basin on BST Lower Body Bathing: Moderate assistance;Sit to/from stand Lower Body Bathing Details (indicate cue type and reason): with 2WW for balnace, alternating UEs. Upper Body Dressing : Minimal assistance Upper Body Dressing Details (indicate cue type and reason): d/t IV line in L UE, to orient garment Lower Body Dressing: Minimal assistance;Moderate assistance;Sit to/from stand Lower  Body Dressing Details (indicate cue type and reason): with 2WW for standing balance to perform clothing mgt over hips. Pt able to don sock to L LE in long sitting in bed with SETUP             Functional mobility during ADLs: Min guard;Rolling walker (to complete ~7-8 steps FWD/BCKWD with 1 standing rest break, pt temporarily put back on nasal cannula  at 2L during seated rest to allow RR to stabilize)       Vision Patient Visual Report: No change from baseline     Perception     Praxis      Cognition Arousal/Alertness: Awake/alert Behavior During Therapy: WFL for tasks assessed/performed Overall Cognitive Status: Within Functional Limits for tasks assessed                                          Exercises Other Exercises Other Exercises: OT engages pt in bathing/dressing tasks   Shoulder Instructions       General Comments      Pertinent Vitals/ Pain       Pain Assessment: No/denies pain Faces Pain Scale: No hurt  Home Living                                          Prior Functioning/Environment              Frequency  Min 1X/week        Progress Toward Goals  OT Goals(current goals can now be found in the care plan section)  Progress towards OT goals: Progressing toward goals  Acute Rehab OT Goals Patient Stated Goal: to return home with HHPT, to feel better OT Goal Formulation: With patient Time For Goal Achievement: 05/27/21 Potential to Achieve Goals: Good  Plan Discharge plan remains appropriate    Co-evaluation                 AM-PAC OT "6 Clicks" Daily Activity     Outcome Measure   Help from another person eating meals?: None Help from another person taking care of personal grooming?: A Little Help from another person toileting, which includes using toliet, bedpan, or urinal?: A Little Help from another person bathing (including washing, rinsing, drying)?: A Little Help from another person to put on and taking off regular upper body clothing?: A Little Help from another person to put on and taking off regular lower body clothing?: A Little 6 Click Score: 19    End of Session Equipment Utilized During Treatment: Rolling walker  OT Visit Diagnosis: Unsteadiness on feet (R26.81);Muscle weakness (generalized) (M62.81)   Activity Tolerance  Patient tolerated treatment well;Other (comment) (limited by nausea, somewhat self limiting)   Patient Left in bed;with call bell/phone within reach;with nursing/sitter in room   Nurse Communication Mobility status        Time: 9518-8416 OT Time Calculation (min): 38 min  Charges: OT General Charges $OT Visit: 1 Visit OT Treatments $Self Care/Home Management : 23-37 mins $Therapeutic Activity: 8-22 mins  Gerrianne Scale, [place], OTR/L ascom [phone] 05/15/21, 2:03 PM

## 2021-05-16 LAB — BASIC METABOLIC PANEL
Anion gap: 5 (ref 5–15)
BUN: <5 mg/dL — ABNORMAL LOW (ref 8–23)
CO2: 27 mmol/L (ref 22–32)
Calcium: 8.5 mg/dL — ABNORMAL LOW (ref 8.9–10.3)
Chloride: 110 mmol/L (ref 98–111)
Creatinine, Ser: 0.92 mg/dL (ref 0.44–1.00)
GFR, Estimated: >60 mL/min (ref >60)
Glucose, Bld: 95 mg/dL (ref 70–99)
Potassium: 3.9 mmol/L (ref 3.5–5.1)
Sodium: 142 mmol/L (ref 135–145)

## 2021-05-16 LAB — CBC WITH DIFFERENTIAL/PLATELET
Abs Immature Granulocytes: 0.02 10*3/uL (ref 0.00–0.07)
Basophils Absolute: 0.1 10*3/uL (ref 0.0–0.1)
Basophils Relative: 1 %
Eosinophils Absolute: 0.2 10*3/uL (ref 0.0–0.5)
Eosinophils Relative: 3 %
HCT: 32.2 % — ABNORMAL LOW (ref 36.0–46.0)
Hemoglobin: 10.5 g/dL — ABNORMAL LOW (ref 12.0–15.0)
Immature Granulocytes: 0 %
Lymphocytes Relative: 21 %
Lymphs Abs: 1.5 10*3/uL (ref 0.7–4.0)
MCH: 30.6 pg (ref 26.0–34.0)
MCHC: 32.6 g/dL (ref 30.0–36.0)
MCV: 93.9 fL (ref 80.0–100.0)
Monocytes Absolute: 0.8 10*3/uL (ref 0.1–1.0)
Monocytes Relative: 12 %
Neutro Abs: 4.4 10*3/uL (ref 1.7–7.7)
Neutrophils Relative %: 63 %
Platelets: 233 10*3/uL (ref 150–400)
RBC: 3.43 MIL/uL — ABNORMAL LOW (ref 3.87–5.11)
RDW: 14.8 % (ref 11.5–15.5)
WBC: 7 10*3/uL (ref 4.0–10.5)
nRBC: 0 % (ref 0.0–0.2)

## 2021-05-16 NOTE — Progress Notes (Signed)
PROGRESS NOTE    Erin Sutton  MVH:[id number] DOB: 09/20/1941 DOA: 05/12/2021 PCP: Baxter Hire, MD    Brief Narrative:  80 y.o. female with medical history significant for anxiety, depression, hypertension, hyperlipidemia, atrial fibrillation, history of left lower extremity DVT, history of bilateral pulmonary embolism, GERD, history of major depressive disorder, hypothyroid, mild to moderate dementia, constipation, presents to the ED for multiple concerns, chiefly nausea and vomiting. Her last PO intake was 05/10/21 and it was a baked potatoe that she made. She reports she has not been able to eat, keep anything down, including medications since 05/12/21 AM.  She reports that her vomitus is yellow and bitter and sour tasting. CT demonstrates evidence of mild colitis.  Patient remains hemodynamically stable but is not able to tolerate p.o. intake. Pt admitted for further management.    Today, patient reports nausea is improving, denies any vomiting.  Patient was able to eat a little bit of her full liquid breakfast, advance diet to soft to see if patient will tolerate it.  Denies any worsening abdominal pain, chest pain, shortness of breath, fever/chills.   Assessment & Plan:   Principal Problem:   Intractable vomiting with nausea Active Problems:   Hypothyroidism   HTN (hypertension)   GERD (gastroesophageal reflux disease)   Anxiety   Intractable nausea and vomiting   Bilateral pulmonary embolism (HCC)   Hyperlipidemia   Sleep apnea   Dementia (HCC)   Left leg DVT (HCC)   Major depressive disorder, single episode   Constipation   Hyperparathyroidism (HCC)   Gastroenteritis   Intractable nausea and vomiting Possible differentials include infectious colitis, mild ischemic colitis Currently afebrile, with no leukocytosis Labs unremarkable CT abdomen pelvis, showing possible colitis Abdominal x-ray noted stool throughout the colon but no increased stool burden to suggest  constipation, no bowel obstruction Due to slight improvement of her symptoms, no need to start antibiotics Discontinue IV fluids, continue antiemetics, simethicone, PPI Advance diet to soft Monitor closely  Hypokalemia Replace as needed   Hypertension BP stable Continue Aldactone 25 daily, amlodipine 10 daily  History of bilateral pulmonary embolism, left lower extremity DVT, and atrial fibrillation Continue Eliquis 5 mg p.o. twice daily   Hypothyroid levothyroxine 50 mcg daily    Anxiety/panic attacks home Ativan 0.5 mg p.o. 3 times daily as needed   GERD PPI daily    Chronic heart failure preserved ejection fraction, grade 1 diastolic dysfunction Moderate LVH Appears euvolemic and compensated at this time    DVT prophylaxis: Eliquis Code Status: Full Family Communication: None at bedside Disposition Plan: Status is: Inpatient  Inpatient because: IV treatments appropriate due to intensity of illness or inability to take PO and Inpatient level of care appropriate due to severity of illness  Dispo: The patient is from: Home              Anticipated d/c is to: Home              Patient currently is not medically stable to d/c.   Difficult to place patient No   Consultants:  None  Procedures:  None  Antimicrobials:  None   Objective: Vitals:   05/16/21 0444 05/16/21 0755 05/16/21 1100 05/16/21 1105  BP: (!) 107/52 127/60  135/66  Pulse: (!) 57 62  (!) 58  Resp: 16 14  16   Temp: 97.8 F (36.6 C) 98 F (36.7 C)  98.3 F (36.8 C)  TempSrc: Oral Oral  Oral  SpO2: 95% 96% 96%  100%  Weight:      Height:        Intake/Output Summary (Last 24 hours) at 05/16/2021 1516 Last data filed at 05/16/2021 1346 Gross per 24 hour  Intake 3824.27 ml  Output 1750 ml  Net 2074.27 ml   Filed Weights   05/12/21 1020 05/12/21 2354  Weight: 74.4 kg 72.1 kg    Examination: General: NAD Cardiovascular: S1, S2 present Respiratory: CTAB Abdomen: Soft, non-tender,  nondistended, bowel sounds present Musculoskeletal: No bilateral pedal edema noted Skin: Normal Psychiatry: Normal mood      Data Reviewed: I have personally reviewed following labs and imaging studies  CBC: Recent Labs  Lab 05/12/21 1031 05/13/21 0414 05/15/21 1002 05/16/21 0617  WBC 8.0 7.3 6.7 7.0  NEUTROABS  --   --  5.2 4.4  HGB 13.5 11.8* 12.3 10.5*  HCT 40.9 35.0* 37.3 32.2*  MCV 89.3 89.3 91.9 93.9  PLT 311 279 247 250   Basic Metabolic Panel: Recent Labs  Lab 05/12/21 1031 05/13/21 0414 05/15/21 1002 05/16/21 0617  NA 135 136 139 142  K 4.1 3.6 3.3* 3.9  CL 99 103 106 110  CO2 25 26 26 27   GLUCOSE 111* 91 119* 95  BUN 8 9 <5* <5*  CREATININE 0.85 0.88 0.79 0.92  CALCIUM 10.1 8.9 8.9 8.5*   GFR: Estimated Creatinine Clearance: 43.9 mL/min (by C-G formula based on SCr of 0.92 mg/dL). Liver Function Tests: Recent Labs  Lab 05/12/21 1546  AST 50*  ALT 40  ALKPHOS 79  BILITOT 0.9  PROT 7.8  ALBUMIN 4.5   Recent Labs  Lab 05/12/21 1546  LIPASE 27   No results for input(s): AMMONIA in the last 168 hours. Coagulation Profile: No results for input(s): INR, PROTIME in the last 168 hours. Cardiac Enzymes: No results for input(s): CKTOTAL, CKMB, CKMBINDEX, TROPONINI in the last 168 hours. BNP (last 3 results) No results for input(s): PROBNP in the last 8760 hours. HbA1C: No results for input(s): HGBA1C in the last 72 hours. CBG: Recent Labs  Lab 05/12/21 1757  GLUCAP 116*   Lipid Profile: No results for input(s): CHOL, HDL, LDLCALC, TRIG, CHOLHDL, LDLDIRECT in the last 72 hours. Thyroid Function Tests: No results for input(s): TSH, T4TOTAL, FREET4, T3FREE, THYROIDAB in the last 72 hours. Anemia Panel: No results for input(s): VITAMINB12, FOLATE, FERRITIN, TIBC, IRON, RETICCTPCT in the last 72 hours. Sepsis Labs: Recent Labs  Lab 05/13/21 0414  PROCALCITON <0.10    Recent Results (from the past 240 hour(s))  Resp Panel by RT-PCR (Flu  A&B, Covid) Nasopharyngeal Swab     Status: None   Collection Time: 05/12/21  3:46 PM   Specimen: Nasopharyngeal Swab; Nasopharyngeal(NP) swabs in vial transport medium  Result Value Ref Range Status   SARS Coronavirus 2 by RT PCR NEGATIVE NEGATIVE Final    Comment: (NOTE) SARS-CoV-2 target nucleic acids are NOT DETECTED.  The SARS-CoV-2 RNA is generally detectable in upper respiratory specimens during the acute phase of infection. The lowest concentration of SARS-CoV-2 viral copies this assay can detect is 138 copies/mL. A negative result does not preclude SARS-Cov-2 infection and should not be used as the sole basis for treatment or other patient management decisions. A negative result may occur with  improper specimen collection/handling, submission of specimen other than nasopharyngeal swab, presence of viral mutation(s) within the areas targeted by this assay, and inadequate number of viral copies(<138 copies/mL). A negative result must be combined with clinical observations, patient history, and epidemiological  information. The expected result is Negative.  Fact Sheet for Patients:  EntrepreneurPulse.com.au  Fact Sheet for Healthcare Providers:  IncredibleEmployment.be  This test is no t yet approved or cleared by the [place] FDA and  has been authorized for detection and/or diagnosis of SARS-CoV-2 by FDA under an Emergency Use Authorization (EUA). This EUA will remain  in effect (meaning this test can be used) for the duration of the COVID-19 declaration under Section 564(b)(1) of the Act, 21 U.S.C.section 360bbb-3(b)(1), unless the authorization is terminated  or revoked sooner.       Influenza A by PCR NEGATIVE NEGATIVE Final   Influenza B by PCR NEGATIVE NEGATIVE Final    Comment: (NOTE) The Xpert Xpress SARS-CoV-2/FLU/RSV plus assay is intended as an aid in the diagnosis of influenza from Nasopharyngeal swab specimens  and should not be used as a sole basis for treatment. Nasal washings and aspirates are unacceptable for Xpert Xpress SARS-CoV-2/FLU/RSV testing.  Fact Sheet for Patients: EntrepreneurPulse.com.au  Fact Sheet for Healthcare Providers: IncredibleEmployment.be  This test is not yet approved or cleared by the [place] FDA and has been authorized for detection and/or diagnosis of SARS-CoV-2 by FDA under an Emergency Use Authorization (EUA). This EUA will remain in effect (meaning this test can be used) for the duration of the COVID-19 declaration under Section 564(b)(1) of the Act, 21 U.S.C. section 360bbb-3(b)(1), unless the authorization is terminated or revoked.  Performed at [facility], [address] [place], [place] [address]          Radiology Studies: DG Abd Portable 1V  Result Date: 05/15/2021 CLINICAL DATA:  Constipation. EXAM: PORTABLE ABDOMEN - 1 VIEW COMPARISON:  CT 3 days ago. FINDINGS: No abnormal gastric distension. No small bowel dilatation. Air-filled ascending, transverse, and descending colon without abnormal distension. Small volume of stool in the rectum. No increased stool burden to suggest constipation. No radiopaque calculi. Degenerative change in the spine. IMPRESSION: 1. Small volume of stool in the rectum. Otherwise paucity of formed stool throughout the colon, no increased stool burden to suggest constipation. 2. No bowel obstruction. Electronically Signed   By: Keith Rake M.D.   On: 05/15/2021 17:59        Scheduled Meds:  amLODipine  10 mg Oral Daily   apixaban  5 mg Oral BID   citalopram  10 mg Oral Daily   levothyroxine  50 mcg Oral QAC breakfast   pantoprazole  40 mg Oral BID   polyethylene glycol  17 g Oral Daily   senna-docusate  1 tablet Oral BID   simethicone  80 mg Oral QID   traZODone  50 mg Oral QHS   Continuous Infusions:  dextrose 5 % and 0.9% NaCl 75 mL/hr at 05/16/21  1346   promethazine (PHENERGAN) injection (IM or IVPB) Stopped (05/15/21 1726)     LOS: 2 days     Alma Friendly, MD [facility]  If 7PM-7AM, please contact night-coverage 05/16/2021, 3:16 PM

## 2021-05-16 NOTE — Progress Notes (Signed)
Physical Therapy Treatment Patient Details Name: Erin Sutton MRN: [id number] DOB: 11/15/1941 Today's Date: 05/16/2021    History of Present Illness Pt is a 80 y/o F with PMH: HTN, hypothyroidism, and OSA who presented to ED d/t nausea and headache. Pt adm under observation d/t intractable nausea and vomiting with poor PO intake. W/u included CT positive for colitis.    PT Comments    Patient received in recliner, she is agreeable to ambulate this afternoon. Patient found to be wet in chair, pure wick not working well. Required changing gown, socks, underwear, linens. RN notified. Patient assisted to bathroom, then ambulated another 20 feet with RW and min guard. Patient fatigued after all of the activity.     Follow Up Recommendations  Home health PT     Equipment Recommendations  None recommended by PT    Recommendations for Other Services       Precautions / Restrictions Precautions Precautions: Fall Precaution Comments: mod fall Restrictions Weight Bearing Restrictions: No    Mobility  Bed Mobility               General bed mobility comments: Not assessed, patient received in recliner and remained up in recliner at end of session.    Transfers Overall transfer level: Needs assistance Equipment used: Rolling walker (2 wheeled) Transfers: Sit to/from Stand Sit to Stand: Min guard         General transfer comment: posterior leaning with initial standing  Ambulation/Gait Ambulation/Gait assistance: Min guard Gait Distance (Feet): 30 Feet Assistive device: Rolling walker (2 wheeled) Gait Pattern/deviations: Step-to pattern;Decreased step length - right;Decreased step length - left;Decreased weight shift to right;Antalgic Gait velocity: decr   General Gait Details: patient ambulates with min guard, slight unsteadiness with mobility and fatigue   Stairs             Wheelchair Mobility    Modified Rankin (Stroke Patients Only)        Balance Overall balance assessment: Needs assistance Sitting-balance support: Feet supported Sitting balance-Leahy Scale: Fair Sitting balance - Comments: initial posterior leaning with standing Postural control: Posterior lean Standing balance support: Bilateral upper extremity supported;During functional activity Standing balance-Leahy Scale: Fair Standing balance comment: UE support on RW, able to alterante in static standing for LB ADL participation                            Cognition Arousal/Alertness: Awake/alert Behavior During Therapy: WFL for tasks assessed/performed Overall Cognitive Status: Within Functional Limits for tasks assessed                                        Exercises Other Exercises Other Exercises: LE strengthening in recliner: seated LAQ, marching, long sitting AP, SLR, hip abd/add x 10 reps each.    General Comments        Pertinent Vitals/Pain Pain Assessment: Faces Faces Pain Scale: Hurts little more Pain Location: Chronic right knee pain. States she needs a knee replacement Pain Descriptors / Indicators: Discomfort;Sore Pain Intervention(s): Monitored during session;Repositioned    Home Living                      Prior Function            PT Goals (current goals can now be found in the care plan section) Acute Rehab PT  Goals Patient Stated Goal: to return home with HHPT, to feel better PT Goal Formulation: With patient Time For Goal Achievement: 05/21/21 Potential to Achieve Goals: Good Progress towards PT goals: Progressing toward goals    Frequency    Min 2X/week      PT Plan Current plan remains appropriate    Co-evaluation              AM-PAC PT "6 Clicks" Mobility   Outcome Measure  Help needed turning from your back to your side while in a flat bed without using bedrails?: None Help needed moving from lying on your back to sitting on the side of a flat bed without using  bedrails?: A Little Help needed moving to and from a bed to a chair (including a wheelchair)?: A Little Help needed standing up from a chair using your arms (e.g., wheelchair or bedside chair)?: A Little Help needed to walk in hospital room?: A Little Help needed climbing 3-5 steps with a railing? : A Little 6 Click Score: 19    End of Session Equipment Utilized During Treatment: Gait belt Activity Tolerance: Patient limited by pain;Patient limited by fatigue Patient left: in chair;with chair alarm set;with call bell/phone within reach Nurse Communication: Mobility status PT Visit Diagnosis: Unsteadiness on feet (R26.81);Muscle weakness (generalized) (M62.81);Difficulty in walking, not elsewhere classified (R26.2)     Time: 1330-1350 PT Time Calculation (min) (ACUTE ONLY): 20 min  Charges:  $Gait Training: 8-22 mins $Therapeutic Exercise: 8-22 mins                     Sonita Michiels, PT, GCS 05/16/21,2:54 PM

## 2021-05-16 NOTE — Progress Notes (Signed)
Physical Therapy Treatment Patient Details Name: Erin Sutton MRN: [id number] DOB: 08-20-1941 Today's Date: 05/16/2021    History of Present Illness Pt is a 80 y/o F with PMH: HTN, hypothyroidism, and OSA who presented to ED d/t nausea and headache. Pt adm under observation d/t intractable nausea and vomiting with poor PO intake. W/u included CT positive for colitis.    PT Comments    Patient received in recliner, she reports she walked to bathroom and back to recliner just a bit ago. Wants to wait to walk again later. She is agreeable to LE strengthening exercises. Patient reports she is feeling better today with less nausea. Does well with exercises, limited by right knee pain ( chronic OA). Patient will continue to benefit from skilled PT while here to improve functional independence and strength.        Follow Up Recommendations  Home health PT     Equipment Recommendations  None recommended by PT    Recommendations for Other Services       Precautions / Restrictions Precautions Precaution Comments: mod fall Restrictions Weight Bearing Restrictions: No    Mobility  Bed Mobility               General bed mobility comments: patient received in recliner. States she walked into bathroom and back to recliner earlier.    Transfers                 General transfer comment: not assessed this session  Ambulation/Gait             General Gait Details: patient declines further ambulation at this time, will try later   Stairs             Wheelchair Mobility    Modified Rankin (Stroke Patients Only)       Balance                                            Cognition Arousal/Alertness: Awake/alert Behavior During Therapy: WFL for tasks assessed/performed Overall Cognitive Status: Within Functional Limits for tasks assessed                                        Exercises Other Exercises Other  Exercises: LE strengthening in recliner: seated LAQ, marching, long sitting AP, SLR, hip abd/add x 10 reps each.    General Comments        Pertinent Vitals/Pain Pain Assessment: Faces Faces Pain Scale: Hurts little more Pain Location: Chronic right knee pain. States she needs a knee replacement Pain Descriptors / Indicators: Discomfort;Sore Pain Intervention(s): Monitored during session;Repositioned    Home Living                      Prior Function            PT Goals (current goals can now be found in the care plan section) Acute Rehab PT Goals Patient Stated Goal: to return home with HHPT, to feel better PT Goal Formulation: With patient Time For Goal Achievement: 05/21/21 Potential to Achieve Goals: Good Progress towards PT goals: Progressing toward goals    Frequency    Min 2X/week      PT Plan Current plan remains appropriate    Co-evaluation  AM-PAC PT "6 Clicks" Mobility   Outcome Measure  Help needed turning from your back to your side while in a flat bed without using bedrails?: None Help needed moving from lying on your back to sitting on the side of a flat bed without using bedrails?: A Little Help needed moving to and from a bed to a chair (including a wheelchair)?: A Little Help needed standing up from a chair using your arms (e.g., wheelchair or bedside chair)?: A Little Help needed to walk in hospital room?: A Little Help needed climbing 3-5 steps with a railing? : A Little 6 Click Score: 19    End of Session   Activity Tolerance: Patient limited by pain;Patient limited by fatigue Patient left: in chair;with call bell/phone within reach Nurse Communication: Mobility status PT Visit Diagnosis: Unsteadiness on feet (R26.81);Difficulty in walking, not elsewhere classified (R26.2);Muscle weakness (generalized) (M62.81)     Time: 3235-5732 PT Time Calculation (min) (ACUTE ONLY): 11 min  Charges:  $Therapeutic  Exercise: 8-22 mins                    [employer], PT, GCS 05/16/21,11:09 AM

## 2021-05-17 MED ORDER — METRONIDAZOLE 500 MG/100ML IV SOLN
500.0000 mg | Freq: Three times a day (TID) | INTRAVENOUS | Status: DC
  Administered 2021-05-17 – 2021-05-19 (×6): 500 mg via INTRAVENOUS
  Filled 2021-05-17 (×11): qty 100

## 2021-05-17 MED ORDER — SODIUM CHLORIDE 0.9 % IV SOLN
2.0000 g | INTRAVENOUS | Status: DC
  Administered 2021-05-17 – 2021-05-19 (×3): 2 g via INTRAVENOUS
  Filled 2021-05-17: qty 2
  Filled 2021-05-17 (×3): qty 20

## 2021-05-17 NOTE — Progress Notes (Signed)
OT Cancellation Note  Patient Details Name: GLENDALE WHERRY MRN: [id number] DOB: January 03, 1941   Cancelled Treatment:    Reason Eval/Treat Not Completed: Fatigue/lethargy limiting ability to participate. Pt in bed, stating she is tired and nauseous, unable to get out of bed and participate in therapy at this time. Will attempt again at a later time/date, as pt is able and willing.   Josiah Lobo, PhD, MS, OTR/L 05/17/21, 11:09 AM

## 2021-05-17 NOTE — Progress Notes (Signed)
PROGRESS NOTE    Erin Sutton  PNT:[id number] DOB: 1941-04-22 DOA: 05/12/2021 PCP: Baxter Hire, MD    Brief Narrative:  80 y.o. female with medical history significant for anxiety, depression, hypertension, hyperlipidemia, atrial fibrillation, history of left lower extremity DVT, history of bilateral pulmonary embolism, GERD, history of major depressive disorder, hypothyroid, mild to moderate dementia, constipation, presents to the ED for multiple concerns, chiefly nausea and vomiting. Her last PO intake was 05/10/21 and it was a baked potatoe that she made. She reports she has not been able to eat, keep anything down, including medications since 05/12/21 AM.  She reports that her vomitus is yellow and bitter and sour tasting. CT demonstrates evidence of mild colitis.  Patient remains hemodynamically stable but is not able to tolerate p.o. intake. Pt admitted for further management.    Today, patient now complaining of nausea, with an episode of vomiting this morning, generalized abdominal pain, generalized weakness.  Denies any chest pain, fever/chills.  Significant change from yesterday.   Assessment & Plan:   Principal Problem:   Intractable vomiting with nausea Active Problems:   Hypothyroidism   HTN (hypertension)   GERD (gastroesophageal reflux disease)   Anxiety   Intractable nausea and vomiting   Bilateral pulmonary embolism (HCC)   Hyperlipidemia   Sleep apnea   Dementia (HCC)   Left leg DVT (HCC)   Major depressive disorder, single episode   Constipation   Hyperparathyroidism (HCC)   Gastroenteritis   Intractable nausea and vomiting Possible differentials include infectious colitis, mild ischemic colitis Currently afebrile, with no leukocytosis Labs unremarkable, procalcitonin negative CT abdomen pelvis, showing possible colitis Abdominal x-ray noted stool throughout the colon but no increased stool burden to suggest constipation, no bowel obstruction Start  antibiotics, Rocephin and Flagyl, assess for improvement Discontinue IV fluids, continue antiemetics, simethicone, PPI Advance diet to soft Monitor closely  Hypokalemia Replace as needed   Hypertension BP stable Continue Aldactone 25 daily, amlodipine 10 daily  History of bilateral pulmonary embolism, left lower extremity DVT, and atrial fibrillation Continue Eliquis 5 mg p.o. twice daily   Hypothyroid levothyroxine 50 mcg daily    Anxiety/panic attacks home Ativan 0.5 mg p.o. 3 times daily as needed   GERD PPI daily    Chronic heart failure preserved ejection fraction, grade 1 diastolic dysfunction Moderate LVH Appears euvolemic and compensated at this time    DVT prophylaxis: Eliquis Code Status: Full Family Communication: None at bedside Disposition Plan: Status is: Inpatient  Inpatient because: IV treatments appropriate due to intensity of illness or inability to take PO and Inpatient level of care appropriate due to severity of illness  Dispo: The patient is from: Home              Anticipated d/c is to: Home              Patient currently is not medically stable to d/c.   Difficult to place patient No   Consultants:  None  Procedures:  None  Antimicrobials:  Rocephin Flagyl   Objective: Vitals:   05/16/21 2107 05/17/21 0505 05/17/21 0827 05/17/21 1536  BP: 115/60 (!) 146/81 111/61 119/62  Pulse: (!) 58 83 69 67  Resp: 16 18 16 16   Temp: 98 F (36.7 C) 99.3 F (37.4 C) 98.7 F (37.1 C) 97.6 F (36.4 C)  TempSrc: Oral Oral Oral Oral  SpO2: 96% 94% 97% 96%  Weight:      Height:  Intake/Output Summary (Last 24 hours) at 05/17/2021 1625 Last data filed at 05/17/2021 1300 Gross per 24 hour  Intake 390 ml  Output 1850 ml  Net -1460 ml   Filed Weights   05/12/21 1020 05/12/21 2354  Weight: 74.4 kg 72.1 kg    Examination: General: NAD Cardiovascular: S1, S2 present Respiratory: CTAB Abdomen: Soft, mildly tender, nondistended,  bowel sounds present Musculoskeletal: No bilateral pedal edema noted Skin: Normal Psychiatry: Normal mood      Data Reviewed: I have personally reviewed following labs and imaging studies  CBC: Recent Labs  Lab 05/12/21 1031 05/13/21 0414 05/15/21 1002 05/16/21 0617  WBC 8.0 7.3 6.7 7.0  NEUTROABS  --   --  5.2 4.4  HGB 13.5 11.8* 12.3 10.5*  HCT 40.9 35.0* 37.3 32.2*  MCV 89.3 89.3 91.9 93.9  PLT 311 279 247 098   Basic Metabolic Panel: Recent Labs  Lab 05/12/21 1031 05/13/21 0414 05/15/21 1002 05/16/21 0617  NA 135 136 139 142  K 4.1 3.6 3.3* 3.9  CL 99 103 106 110  CO2 25 26 26 27   GLUCOSE 111* 91 119* 95  BUN 8 9 <5* <5*  CREATININE 0.85 0.88 0.79 0.92  CALCIUM 10.1 8.9 8.9 8.5*   GFR: Estimated Creatinine Clearance: 43.9 mL/min (by C-G formula based on SCr of 0.92 mg/dL). Liver Function Tests: Recent Labs  Lab 05/12/21 1546  AST 50*  ALT 40  ALKPHOS 79  BILITOT 0.9  PROT 7.8  ALBUMIN 4.5   Recent Labs  Lab 05/12/21 1546  LIPASE 27   No results for input(s): AMMONIA in the last 168 hours. Coagulation Profile: No results for input(s): INR, PROTIME in the last 168 hours. Cardiac Enzymes: No results for input(s): CKTOTAL, CKMB, CKMBINDEX, TROPONINI in the last 168 hours. BNP (last 3 results) No results for input(s): PROBNP in the last 8760 hours. HbA1C: No results for input(s): HGBA1C in the last 72 hours. CBG: Recent Labs  Lab 05/12/21 1757  GLUCAP 116*   Lipid Profile: No results for input(s): CHOL, HDL, LDLCALC, TRIG, CHOLHDL, LDLDIRECT in the last 72 hours. Thyroid Function Tests: No results for input(s): TSH, T4TOTAL, FREET4, T3FREE, THYROIDAB in the last 72 hours. Anemia Panel: No results for input(s): VITAMINB12, FOLATE, FERRITIN, TIBC, IRON, RETICCTPCT in the last 72 hours. Sepsis Labs: Recent Labs  Lab 05/13/21 0414  PROCALCITON <0.10    Recent Results (from the past 240 hour(s))  Resp Panel by RT-PCR (Flu A&B, Covid)  Nasopharyngeal Swab     Status: None   Collection Time: 05/12/21  3:46 PM   Specimen: Nasopharyngeal Swab; Nasopharyngeal(NP) swabs in vial transport medium  Result Value Ref Range Status   SARS Coronavirus 2 by RT PCR NEGATIVE NEGATIVE Final    Comment: (NOTE) SARS-CoV-2 target nucleic acids are NOT DETECTED.  The SARS-CoV-2 RNA is generally detectable in upper respiratory specimens during the acute phase of infection. The lowest concentration of SARS-CoV-2 viral copies this assay can detect is 138 copies/mL. A negative result does not preclude SARS-Cov-2 infection and should not be used as the sole basis for treatment or other patient management decisions. A negative result may occur with  improper specimen collection/handling, submission of specimen other than nasopharyngeal swab, presence of viral mutation(s) within the areas targeted by this assay, and inadequate number of viral copies(<138 copies/mL). A negative result must be combined with clinical observations, patient history, and epidemiological information. The expected result is Negative.  Fact Sheet for Patients:  EntrepreneurPulse.com.au  Fact  Sheet for Healthcare Providers:  IncredibleEmployment.be  This test is no t yet approved or cleared by the [place] FDA and  has been authorized for detection and/or diagnosis of SARS-CoV-2 by FDA under an Emergency Use Authorization (EUA). This EUA will remain  in effect (meaning this test can be used) for the duration of the COVID-19 declaration under Section 564(b)(1) of the Act, 21 U.S.C.section 360bbb-3(b)(1), unless the authorization is terminated  or revoked sooner.       Influenza A by PCR NEGATIVE NEGATIVE Final   Influenza B by PCR NEGATIVE NEGATIVE Final    Comment: (NOTE) The Xpert Xpress SARS-CoV-2/FLU/RSV plus assay is intended as an aid in the diagnosis of influenza from Nasopharyngeal swab specimens and should not be  used as a sole basis for treatment. Nasal washings and aspirates are unacceptable for Xpert Xpress SARS-CoV-2/FLU/RSV testing.  Fact Sheet for Patients: EntrepreneurPulse.com.au  Fact Sheet for Healthcare Providers: IncredibleEmployment.be  This test is not yet approved or cleared by the [place] FDA and has been authorized for detection and/or diagnosis of SARS-CoV-2 by FDA under an Emergency Use Authorization (EUA). This EUA will remain in effect (meaning this test can be used) for the duration of the COVID-19 declaration under Section 564(b)(1) of the Act, 21 U.S.C. section 360bbb-3(b)(1), unless the authorization is terminated or revoked.  Performed at [facility], [address] [place], [place] [address]          Radiology Studies: No results found.      Scheduled Meds:  amLODipine  10 mg Oral Daily   apixaban  5 mg Oral BID   citalopram  10 mg Oral Daily   levothyroxine  50 mcg Oral QAC breakfast   pantoprazole  40 mg Oral BID   polyethylene glycol  17 g Oral Daily   senna-docusate  1 tablet Oral BID   simethicone  80 mg Oral QID   traZODone  50 mg Oral QHS   Continuous Infusions:  cefTRIAXone (ROCEPHIN)  IV     metronidazole     promethazine (PHENERGAN) injection (IM or IVPB) Stopped (05/15/21 1726)     LOS: 3 days     Alma Friendly, MD [facility]  If 7PM-7AM, please contact night-coverage 05/17/2021, 4:25 PM

## 2021-05-17 NOTE — Plan of Care (Signed)
  Problem: Nutrition: Goal: Adequate nutrition will be maintained Outcome: Progressing   Problem: Elimination: Goal: Will not experience complications related to bowel motility Outcome: Progressing   

## 2021-05-17 NOTE — Care Management Important Message (Signed)
Important Message  Patient Details  Name: Erin Sutton MRN: [id number] Date of Birth: February 15, 1941   Medicare Important Message Given:  Yes     [name] 05/17/2021, 12:08 PM

## 2021-05-17 NOTE — Progress Notes (Signed)
Mobility Specialist - Progress Note   05/17/21 1400  Mobility  Activity Refused mobility  Mobility performed by Mobility specialist    Pt sleeping in recliner upon arrival, easily awakened by voice. Pt politely declined mobility this date d/t nausea and fatigue. Also declined transfer to bed as she voiced comfort resting in chair. Will attempt session another date/time as appropriate.    Kathee Delton Mobility Specialist 05/17/21, 2:40 PM

## 2021-05-17 NOTE — Progress Notes (Signed)
PT Cancellation Note  Patient Details Name: Erin Sutton MRN: [id number] DOB: 03-05-1941   Cancelled Treatment:     Therapist also in to see pt this pm who also politely declined any activity.   Josie Dixon 05/17/2021, 2:46 PM

## 2021-05-18 ENCOUNTER — Other Ambulatory Visit: Payer: Self-pay

## 2021-05-18 DIAGNOSIS — R0789 Other chest pain: Secondary | ICD-10-CM

## 2021-05-18 LAB — CBC WITH DIFFERENTIAL/PLATELET
Abs Immature Granulocytes: 0.01 10*3/uL (ref 0.00–0.07)
Basophils Absolute: 0 10*3/uL (ref 0.0–0.1)
Basophils Relative: 1 %
Eosinophils Absolute: 0.2 10*3/uL (ref 0.0–0.5)
Eosinophils Relative: 3 %
HCT: 33.1 % — ABNORMAL LOW (ref 36.0–46.0)
Hemoglobin: 10.9 g/dL — ABNORMAL LOW (ref 12.0–15.0)
Immature Granulocytes: 0 %
Lymphocytes Relative: 27 %
Lymphs Abs: 1.5 10*3/uL (ref 0.7–4.0)
MCH: 30.3 pg (ref 26.0–34.0)
MCHC: 32.9 g/dL (ref 30.0–36.0)
MCV: 91.9 fL (ref 80.0–100.0)
Monocytes Absolute: 0.6 10*3/uL (ref 0.1–1.0)
Monocytes Relative: 10 %
Neutro Abs: 3.4 10*3/uL (ref 1.7–7.7)
Neutrophils Relative %: 59 %
Platelets: 211 10*3/uL (ref 150–400)
RBC: 3.6 MIL/uL — ABNORMAL LOW (ref 3.87–5.11)
RDW: 14.6 % (ref 11.5–15.5)
WBC: 5.7 10*3/uL (ref 4.0–10.5)
nRBC: 0 % (ref 0.0–0.2)

## 2021-05-18 LAB — BASIC METABOLIC PANEL
Anion gap: 5 (ref 5–15)
BUN: 8 mg/dL (ref 8–23)
CO2: 29 mmol/L (ref 22–32)
Calcium: 9.2 mg/dL (ref 8.9–10.3)
Chloride: 105 mmol/L (ref 98–111)
Creatinine, Ser: 0.81 mg/dL (ref 0.44–1.00)
GFR, Estimated: >60 mL/min (ref >60)
Glucose, Bld: 92 mg/dL (ref 70–99)
Potassium: 3.8 mmol/L (ref 3.5–5.1)
Sodium: 139 mmol/L (ref 135–145)

## 2021-05-18 LAB — TROPONIN I (HIGH SENSITIVITY)
Troponin I (High Sensitivity): 9 ng/L (ref <18)
Troponin I (High Sensitivity): 8 ng/L (ref <18)

## 2021-05-18 NOTE — Progress Notes (Addendum)
Patient c/o misternal burning that radiates upwards towards throat with acute onset this morning. She states nothing exacerbates or improves pain. She reports it intermittently happens, mostly in the morning. When asked if she has spoken with anyone about this pain she said no. When asked how she lays at night she states she has to sit up straight with 3 pillows behind her back or she cannot breathe. She states it isn't exacerbated by movement. Denies associated SOB. MD paged awaiting response.  Update- no response, MD paged again [phone].

## 2021-05-18 NOTE — Progress Notes (Signed)
I personally assessed patient and agree with assessment data collected by Letitia Caul, SN

## 2021-05-18 NOTE — Progress Notes (Signed)
PROGRESS NOTE    Erin Sutton  NFA:[id number] DOB: [birth year] DOA: 05/12/2021 PCP: Baxter Hire, MD    Brief Narrative:  80 y.o. female with medical history significant for anxiety, depression, hypertension, hyperlipidemia, atrial fibrillation, history of left lower extremity DVT, history of bilateral pulmonary embolism, GERD, history of major depressive disorder, hypothyroid, mild to moderate dementia, constipation, presents to the ED for multiple concerns, chiefly nausea and vomiting. Her last PO intake was 05/10/21 and it was a baked potatoe that she made. She reports she has not been able to eat, keep anything down, including medications since 05/12/21 AM.  She reports that her vomitus is yellow and bitter and sour tasting. CT demonstrates evidence of mild colitis.  Patient remains hemodynamically stable but is not able to tolerate p.o. intake. Pt admitted for further management.     Today, patient still nauseous, with an episode of vomiting, now with burning epigastric pain.  Had an episode of loose stools likely due to antibiotics.  Not able to tolerate her food.  Patient denies any chest pain, shortness of breath.   Assessment & Plan:   Principal Problem:   Intractable vomiting with nausea Active Problems:   Hypothyroidism   HTN (hypertension)   GERD (gastroesophageal reflux disease)   Anxiety   Intractable nausea and vomiting   Bilateral pulmonary embolism (HCC)   Hyperlipidemia   Sleep apnea   Dementia (HCC)   Left leg DVT (HCC)   Major depressive disorder, single episode   Constipation   Hyperparathyroidism (HCC)   Gastroenteritis   Intractable nausea and vomiting Possible differentials include infectious colitis, mild ischemic colitis Currently afebrile, with no leukocytosis Labs unremarkable, procalcitonin negative CT abdomen pelvis, showing possible colitis Abdominal x-ray noted stool throughout the colon but no increased stool burden to suggest  constipation, no bowel obstruction Continue Rocephin and Flagyl, assess for improvement Discontinue IV fluids, continue antiemetics, simethicone, PPI Advance diet to soft GI consulted, appreciate recs, may undergo EGD/flex sig if symptoms persist further Monitor closely  Hypokalemia Replace as needed   Hypertension BP stable Continue Aldactone 25 daily, amlodipine 10 daily  History of bilateral pulmonary embolism, left lower extremity DVT, and atrial fibrillation Hold Eliquis 5 mg p.o. twice daily pending follow-up work-up for GI  Normocytic anemia Baseline hemoglobin normal, mild drop noted No evidence of bleeding Anemia panel pending Daily CBC  Hypothyroid levothyroxine 50 mcg daily    Anxiety/panic attacks home Ativan 0.5 mg p.o. 3 times daily as needed   GERD PPI daily    Chronic heart failure preserved ejection fraction, grade 1 diastolic dysfunction Moderate LVH Appears euvolemic and compensated at this time    DVT prophylaxis: Eliquis- hold for now Code Status: Full Family Communication: None at bedside Disposition Plan: Status is: Inpatient  Inpatient because: IV treatments appropriate due to intensity of illness or inability to take PO and Inpatient level of care appropriate due to severity of illness  Dispo: The patient is from: Home              Anticipated d/c is to: Home              Patient currently is not medically stable to d/c.   Difficult to place patient No   Consultants:  GI  Procedures:  None  Antimicrobials:  Rocephin Flagyl   Objective: Vitals:   05/18/21 0839 05/18/21 0951 05/18/21 0956 05/18/21 1120  BP: (!) 143/67 (!) 160/70 (!) 160/70 129/77  Pulse: 63  72 66  Resp: 14  13 15   Temp: 98.2 F (36.8 C)  98.1 F (36.7 C) 98.6 F (37 C)  TempSrc:    Oral  SpO2: 98%  97% 94%  Weight:      Height:        Intake/Output Summary (Last 24 hours) at 05/18/2021 1444 Last data filed at 05/18/2021 1406 Gross per 24 hour   Intake 400 ml  Output 1300 ml  Net -900 ml   Filed Weights   05/12/21 1020 05/12/21 2354  Weight: 74.4 kg 72.1 kg    Examination: General: NAD, ill appearing Cardiovascular: S1, S2 present Respiratory: CTAB Abdomen: Soft, mildly tender, nondistended, bowel sounds present Musculoskeletal: No bilateral pedal edema noted Skin: Normal Psychiatry: Normal mood      Data Reviewed: I have personally reviewed following labs and imaging studies  CBC: Recent Labs  Lab 05/12/21 1031 05/13/21 0414 05/15/21 1002 05/16/21 0617 05/18/21 0439  WBC 8.0 7.3 6.7 7.0 5.7  NEUTROABS  --   --  5.2 4.4 3.4  HGB 13.5 11.8* 12.3 10.5* 10.9*  HCT 40.9 35.0* 37.3 32.2* 33.1*  MCV 89.3 89.3 91.9 93.9 91.9  PLT 311 279 247 233 287   Basic Metabolic Panel: Recent Labs  Lab 05/12/21 1031 05/13/21 0414 05/15/21 1002 05/16/21 0617 05/18/21 0439  NA 135 136 139 142 139  K 4.1 3.6 3.3* 3.9 3.8  CL 99 103 106 110 105  CO2 25 26 26 27 29   GLUCOSE 111* 91 119* 95 92  BUN 8 9 <5* <5* 8  CREATININE 0.85 0.88 0.79 0.92 0.81  CALCIUM 10.1 8.9 8.9 8.5* 9.2   GFR: Estimated Creatinine Clearance: 49.9 mL/min (by C-G formula based on SCr of 0.81 mg/dL). Liver Function Tests: Recent Labs  Lab 05/12/21 1546  AST 50*  ALT 40  ALKPHOS 79  BILITOT 0.9  PROT 7.8  ALBUMIN 4.5   Recent Labs  Lab 05/12/21 1546  LIPASE 27   No results for input(s): AMMONIA in the last 168 hours. Coagulation Profile: No results for input(s): INR, PROTIME in the last 168 hours. Cardiac Enzymes: No results for input(s): CKTOTAL, CKMB, CKMBINDEX, TROPONINI in the last 168 hours. BNP (last 3 results) No results for input(s): PROBNP in the last 8760 hours. HbA1C: No results for input(s): HGBA1C in the last 72 hours. CBG: Recent Labs  Lab 05/12/21 1757  GLUCAP 116*   Lipid Profile: No results for input(s): CHOL, HDL, LDLCALC, TRIG, CHOLHDL, LDLDIRECT in the last 72 hours. Thyroid Function Tests: No  results for input(s): TSH, T4TOTAL, FREET4, T3FREE, THYROIDAB in the last 72 hours. Anemia Panel: No results for input(s): VITAMINB12, FOLATE, FERRITIN, TIBC, IRON, RETICCTPCT in the last 72 hours. Sepsis Labs: Recent Labs  Lab 05/13/21 0414  PROCALCITON <0.10    Recent Results (from the past 240 hour(s))  Resp Panel by RT-PCR (Flu A&B, Covid) Nasopharyngeal Swab     Status: None   Collection Time: 05/12/21  3:46 PM   Specimen: Nasopharyngeal Swab; Nasopharyngeal(NP) swabs in vial transport medium  Result Value Ref Range Status   SARS Coronavirus 2 by RT PCR NEGATIVE NEGATIVE Final    Comment: (NOTE) SARS-CoV-2 target nucleic acids are NOT DETECTED.  The SARS-CoV-2 RNA is generally detectable in upper respiratory specimens during the acute phase of infection. The lowest concentration of SARS-CoV-2 viral copies this assay can detect is 138 copies/mL. A negative result does not preclude SARS-Cov-2 infection and should not be used as the sole basis for treatment or  other patient management decisions. A negative result may occur with  improper specimen collection/handling, submission of specimen other than nasopharyngeal swab, presence of viral mutation(s) within the areas targeted by this assay, and inadequate number of viral copies(<138 copies/mL). A negative result must be combined with clinical observations, patient history, and epidemiological information. The expected result is Negative.  Fact Sheet for Patients:  EntrepreneurPulse.com.au  Fact Sheet for Healthcare Providers:  IncredibleEmployment.be  This test is no t yet approved or cleared by the [place] FDA and  has been authorized for detection and/or diagnosis of SARS-CoV-2 by FDA under an Emergency Use Authorization (EUA). This EUA will remain  in effect (meaning this test can be used) for the duration of the COVID-19 declaration under Section 564(b)(1) of the Act,  21 U.S.C.section 360bbb-3(b)(1), unless the authorization is terminated  or revoked sooner.       Influenza A by PCR NEGATIVE NEGATIVE Final   Influenza B by PCR NEGATIVE NEGATIVE Final    Comment: (NOTE) The Xpert Xpress SARS-CoV-2/FLU/RSV plus assay is intended as an aid in the diagnosis of influenza from Nasopharyngeal swab specimens and should not be used as a sole basis for treatment. Nasal washings and aspirates are unacceptable for Xpert Xpress SARS-CoV-2/FLU/RSV testing.  Fact Sheet for Patients: EntrepreneurPulse.com.au  Fact Sheet for Healthcare Providers: IncredibleEmployment.be  This test is not yet approved or cleared by the [place] FDA and has been authorized for detection and/or diagnosis of SARS-CoV-2 by FDA under an Emergency Use Authorization (EUA). This EUA will remain in effect (meaning this test can be used) for the duration of the COVID-19 declaration under Section 564(b)(1) of the Act, 21 U.S.C. section 360bbb-3(b)(1), unless the authorization is terminated or revoked.  Performed at [facility], [address], [place], [place] [address]          Radiology Studies: No results found.      Scheduled Meds:  amLODipine  10 mg Oral Daily   citalopram  10 mg Oral Daily   levothyroxine  50 mcg Oral QAC breakfast   pantoprazole  40 mg Oral BID   polyethylene glycol  17 g Oral Daily   senna-docusate  1 tablet Oral BID   simethicone  80 mg Oral QID   traZODone  50 mg Oral QHS   Continuous Infusions:  cefTRIAXone (ROCEPHIN)  IV 2 g (05/18/21 1344)   metronidazole Stopped (05/18/21 1338)   promethazine (PHENERGAN) injection (IM or IVPB) Stopped (05/15/21 1726)     LOS: 4 days     Alma Friendly, MD [facility]  If 7PM-7AM, please contact night-coverage 05/18/2021, 2:44 PM

## 2021-05-18 NOTE — Progress Notes (Signed)
Mobility Specialist - Progress Note   05/18/21 1555  Mobility  Activity Refused mobility  Mobility performed by Mobility specialist    Pt sleeping in bed upon arrival, easily awakened by voice. Pt politely declined mobility this date, still reports feeling nauseous and fatigued. Encouraged bed-level therex, pt continued to decline. Will attempt session another date.    Kathee Delton Mobility Specialist 05/18/21, 3:57 PM

## 2021-05-18 NOTE — Consult Note (Signed)
Consultation  Referring Provider:  Dr. Horris Latino Admit date: 05/12/2021 Consult date: 05/18/2021         Reason for Consultation:  Nausea/Vomiting         HPI:   Erin Sutton is a 80 y.o. lady with PMH of GERD, depression, CKD, and hypothyroidism who is here for severe nausea/vomiting. Was admitted almost a week ago. Had a similar hospitalization at [facility] in April. Has had several hospitalizations in the past for a variety of symptoms. Today she states she feels better and trialed some yogurt and has been able to keep it down. Has some mild headache. Had EGD/Colonoscopy in 2016 that was relatively unremarkable. Has had multiple imaging modalities including MRI and CT. Had normal CT with contrast in April but on this admission had CT without contrast with possible colitis. Patient without constipation or diarrhea. We were consulted for consideration of further work-up for nausea. Has had recent brain imaging that was negative for any mass lesions.   Past Medical History:  Diagnosis Date   Anxiety    Arthritis    Chronic kidney disease    Constipation    GERD (gastroesophageal reflux disease)    Hypertension    Hypothyroidism    Osteoporosis    Sleep apnea    Urinary incontinence     Past Surgical History:  Procedure Laterality Date   BREAST BIOPSY Right    neg/stereo   BREAST BIOPSY Left    neg/stereo   BREAST EXCISIONAL BIOPSY Right    neg   CATARACT EXTRACTION, BILATERAL     COLONOSCOPY WITH PROPOFOL N/A 07/02/2015   Procedure: COLONOSCOPY WITH PROPOFOL;  Surgeon: Manya Silvas, MD;  Location: [place];  Service: Endoscopy;  Laterality: N/A;   CORRECTION HAMMER TOE Right    ESOPHAGOGASTRODUODENOSCOPY (EGD) WITH PROPOFOL N/A 07/02/2015   Procedure: ESOPHAGOGASTRODUODENOSCOPY (EGD) WITH PROPOFOL;  Surgeon: Manya Silvas, MD;  Location: [facility] ENDOSCOPY;  Service: Endoscopy;  Laterality: N/A;   JOINT REPLACEMENT Left    knee   REPLACEMENT TOTAL KNEE Left    SKIN BIOPSY  Right     Family History  Problem Relation Age of Onset   Hypertension Mother    Diabetes Mother    Heart attack Mother    Asthma Mother    Peripheral vascular disease Mother    Heart attack Father    Hypertension Father    Diabetes Father    Colon cancer Maternal Aunt    Breast cancer Cousin     Social History   Tobacco Use   Smoking status: Never   Smokeless tobacco: Never  Vaping Use   Vaping Use: Never used  Substance Use Topics   Alcohol use: No   Drug use: No    Prior to Admission medications   Medication Sig Start Date End Date Taking? Authorizing Provider  acetaminophen (TYLENOL) 500 MG tablet Take 500 mg by mouth daily as needed.   Yes [provider]  amLODipine (NORVASC) 10 MG tablet Take 10 mg by mouth daily.   Yes [provider]  apixaban (ELIQUIS) 2.5 MG TABS tablet Take 1 tablet (2.5 mg total) by mouth 2 (two) times daily. Patient taking differently: Take 5 mg by mouth 2 (two) times daily. 02/02/20  Yes Earlie Server, MD  atorvastatin (LIPITOR) 40 MG tablet Take 1 tablet by mouth daily. 02/15/21  Yes [provider]  citalopram (CELEXA) 20 MG tablet Take 10 mg by mouth daily. 04/29/21  Yes [provider]  cyanocobalamin (,  VITAMIN B-12,) 1000 MCG/ML injection Inject 1,000 mcg into the muscle See admin instructions. Inject 1038mcg once weekly for 4 weeks then recheck by Dr Wynetta Emery 05/06/21  Yes [provider]  fluticasone (FLONASE) 50 MCG/ACT nasal spray Place 1 spray into both nostrils daily.   Yes [provider]  levothyroxine (SYNTHROID, LEVOTHROID) 50 MCG tablet Take 50 mcg by mouth daily before breakfast.   Yes [provider]  LORazepam (ATIVAN) 0.5 MG tablet Take 0.5 mg by mouth 3 (three) times daily as needed. 02/01/21  Yes [provider]  ondansetron (ZOFRAN) 8 MG tablet Take 8 mg by mouth every 8 (eight) hours as needed. 03/29/21  Yes [provider]  pantoprazole (PROTONIX) 40 MG  tablet Take 40 mg by mouth daily.   Yes [provider]  Polyethyl Glycol-Propyl Glycol (SYSTANE OP) Place 1 drop into both eyes 2 (two) times daily.   Yes [provider]  polyethylene glycol powder (GLYCOLAX/MIRALAX) 17 GM/SCOOP powder Take 17 g by mouth daily. 08/03/20  Yes [provider]  potassium chloride SA (K-DUR,KLOR-CON) 20 MEQ tablet Take 20 mEq by mouth every morning.   Yes [provider]  senna-docusate (SENOKOT-S) 8.6-50 MG tablet Take 2 tablets by mouth 2 (two) times daily as needed for mild constipation. 04/15/20  Yes Sharen Hones, MD  spironolactone (ALDACTONE) 25 MG tablet Take 25 mg by mouth daily. 04/04/21  Yes [provider]  traZODone (DESYREL) 50 MG tablet Take 50 mg by mouth at bedtime. 05/09/21  Yes [provider]  carvedilol (COREG) 3.125 MG tablet Take 3.125 mg by mouth 2 (two) times daily. Patient not taking: Reported on 05/12/2021 02/13/21   [provider]  isosorbide mononitrate (IMDUR) 30 MG 24 hr tablet Take 30 mg by mouth daily. Patient not taking: Reported on 05/12/2021    [provider]  metoCLOPramide (REGLAN) 5 MG tablet Take 1 tablet (5 mg total) by mouth 3 (three) times daily for 3 days. Patient not taking: Reported on 02/28/2021 04/15/20 04/18/20  Sharen Hones, MD    Current Facility-Administered Medications  Medication Dose Route Frequency Provider Last Rate Last Admin   alum & mag hydroxide-simeth (MAALOX/MYLANTA) 200-200-20 MG/5ML suspension 30 mL  30 mL Oral Q4H PRN Ralene Muskrat B, MD   30 mL at 05/15/21 0931   amLODipine (NORVASC) tablet 10 mg  10 mg Oral Daily Cox, Amy N, DO   10 mg at 05/18/21 0951   cefTRIAXone (ROCEPHIN) 2 g in sodium chloride 0.9 % 100 mL IVPB  2 g Intravenous Q24H Alma Friendly, MD   Stopped at 05/17/21 2308   citalopram (CELEXA) tablet 10 mg  10 mg Oral Daily Cox, Amy N, DO   10 mg at 05/18/21 0949   fluticasone (FLONASE) 50 MCG/ACT nasal spray 1  spray  1 spray Each Nare Daily PRN Cox, Amy N, DO       HYDROcodone-acetaminophen (NORCO/VICODIN) 5-325 MG per tablet 1 tablet  1 tablet Oral Q6H PRN Athena Masse, MD   1 tablet at 05/17/21 0754   levothyroxine (SYNTHROID) tablet 50 mcg  50 mcg Oral QAC breakfast Cox, Amy N, DO   50 mcg at 05/18/21 0528   LORazepam (ATIVAN) tablet 0.5 mg  0.5 mg Oral TID PRN Cox, Amy N, DO   0.5 mg at 05/15/21 2147   metroNIDAZOLE (FLAGYL) IVPB 500 mg  500 mg Intravenous Q8H Alma Friendly, MD 100 mL/hr at 05/18/21 1230 500 mg at 05/18/21 1230  ondansetron (ZOFRAN) tablet 4 mg  4 mg Oral Q6H PRN Cox, Amy N, DO       Or   ondansetron (ZOFRAN) injection 4 mg  4 mg Intravenous Q6H PRN Cox, Amy N, DO   4 mg at 05/18/21 0845   pantoprazole (PROTONIX) EC tablet 40 mg  40 mg Oral BID Ralene Muskrat B, MD   40 mg at 05/18/21 0950   polyethylene glycol (MIRALAX / GLYCOLAX) packet 17 g  17 g Oral Daily Alma Friendly, MD   17 g at 05/18/21 3016   polyvinyl alcohol (LIQUIFILM TEARS) 1.4 % ophthalmic solution 1 drop  1 drop Both Eyes PRN Cox, Amy N, DO   1 drop at 05/14/21 2256   promethazine (PHENERGAN) 12.5 mg in sodium chloride 0.9 % 50 mL IVPB  12.5 mg Intravenous Q6H PRN Cox, Amy N, DO   Stopped at 05/15/21 1726   senna-docusate (Senokot-S) tablet 1 tablet  1 tablet Oral BID Alma Friendly, MD   1 tablet at 05/18/21 0950   simethicone (MYLICON) chewable tablet 80 mg  80 mg Oral QID Ralene Muskrat B, MD   80 mg at 05/18/21 0948   traZODone (DESYREL) tablet 50 mg  50 mg Oral QHS Cox, Amy N, DO   50 mg at 05/17/21 2124    Allergies as of 05/12/2021 - Review Complete 05/12/2021  Allergen Reaction Noted   Ace inhibitors Cough 06/29/2015   Levofloxacin  07/29/2019   Penicillin g Other (See Comments) 05/12/2014   Tape Other (See Comments) 08/17/2014   Amoxicillin Rash 06/29/2015   Benicar [olmesartan] Rash 06/29/2015   Codeine Rash 06/23/2008   Codeine sulfate Rash 06/29/2015   Levaquin  [levofloxacin in d5w] Rash 06/29/2015   Penicillin v potassium Rash 06/29/2015   Sulfa antibiotics Rash 05/12/2014     Review of Systems:    All systems reviewed and negative except where noted in HPI.  Review of Systems  Constitutional:  Positive for malaise/fatigue. Negative for chills and fever.  Cardiovascular:  Negative for chest pain.  Gastrointestinal:  Positive for constipation, heartburn and nausea. Negative for abdominal pain, blood in stool, diarrhea and melena.  Skin:  Negative for itching and rash.  Neurological:  Negative for focal weakness.  All other systems reviewed and are negative.    Physical Exam:  Vital signs in last 24 hours: Temp:  [97.6 F (36.4 C)-99.1 F (37.3 C)] 98.6 F (37 C) (06/25 1120) Pulse Rate:  [55-72] 66 (06/25 1120) Resp:  [13-20] 15 (06/25 1120) BP: (110-160)/(49-77) 129/77 (06/25 1120) SpO2:  [94 %-98 %] 94 % (06/25 1120) Last BM Date:  (05/17/2021) General:   Pleasant in NAD Head:  Normocephalic and atraumatic. Eyes:   No icterus.   Conjunctiva pink. Mouth: Mucosa pink moist, no lesions. Neck:  Supple; no masses felt Lungs:  No respiratory distress  Heart:  S1S2, RRR, no MRG. No edema. Abdomen:   Flat, soft, nondistended, nontender. Msk:  MAEW x4, No clubbing or cyanosis. Strength 5/5. Symmetrical without gross deformities. Neurologic:  Alert and  oriented x4;  Cranial nerves II-XII intact.  Skin:  Warm, dry, pink without significant lesions or rashes. Psych:  Alert and cooperative. Normal affect.  LAB RESULTS: Recent Labs    05/16/21 0617 05/18/21 0439  WBC 7.0 5.7  HGB 10.5* 10.9*  HCT 32.2* 33.1*  PLT 233 211   BMET Recent Labs    05/16/21 0617 05/18/21 0439  NA 142 139  K 3.9 3.8  CL  110 105  CO2 27 29  GLUCOSE 95 92  BUN <5* 8  CREATININE 0.92 0.81  CALCIUM 8.5* 9.2   LFT No results for input(s): PROT, ALBUMIN, AST, ALT, ALKPHOS, BILITOT, BILIDIR, IBILI in the last 72 hours. PT/INR No results for  input(s): LABPROT, INR in the last 72 hours.  STUDIES: No results found.     Impression / Plan:   80 y.o. lady with PMH of GERD, depression, CKD, and hypothyroidism who is here for severe nausea/vomiting  - check morning cortisol (ordered) - hold NOAC for now - monitor how she tolerates a diet today and tomorrow - if no improvement in symptoms tomorrow, can consider EGD +- flex sig for evaluation  Please call with any questions or concerns.  Raylene Miyamoto MD, MPH [place]

## 2021-05-19 LAB — BASIC METABOLIC PANEL
Anion gap: 4 — ABNORMAL LOW (ref 5–15)
BUN: 10 mg/dL (ref 8–23)
CO2: 30 mmol/L (ref 22–32)
Calcium: 9.2 mg/dL (ref 8.9–10.3)
Chloride: 104 mmol/L (ref 98–111)
Creatinine, Ser: 0.87 mg/dL (ref 0.44–1.00)
GFR, Estimated: >60 mL/min (ref >60)
Glucose, Bld: 89 mg/dL (ref 70–99)
Potassium: 3.6 mmol/L (ref 3.5–5.1)
Sodium: 138 mmol/L (ref 135–145)

## 2021-05-19 LAB — CBC WITH DIFFERENTIAL/PLATELET
Abs Immature Granulocytes: 0.01 10*3/uL (ref 0.00–0.07)
Basophils Absolute: 0.1 10*3/uL (ref 0.0–0.1)
Basophils Relative: 1 %
Eosinophils Absolute: 0.1 10*3/uL (ref 0.0–0.5)
Eosinophils Relative: 2 %
HCT: 32.6 % — ABNORMAL LOW (ref 36.0–46.0)
Hemoglobin: 11.1 g/dL — ABNORMAL LOW (ref 12.0–15.0)
Immature Granulocytes: 0 %
Lymphocytes Relative: 27 %
Lymphs Abs: 1.7 10*3/uL (ref 0.7–4.0)
MCH: 30.8 pg (ref 26.0–34.0)
MCHC: 34 g/dL (ref 30.0–36.0)
MCV: 90.6 fL (ref 80.0–100.0)
Monocytes Absolute: 0.8 10*3/uL (ref 0.1–1.0)
Monocytes Relative: 12 %
Neutro Abs: 3.7 10*3/uL (ref 1.7–7.7)
Neutrophils Relative %: 58 %
Platelets: 226 10*3/uL (ref 150–400)
RBC: 3.6 MIL/uL — ABNORMAL LOW (ref 3.87–5.11)
RDW: 14.8 % (ref 11.5–15.5)
WBC: 6.4 10*3/uL (ref 4.0–10.5)
nRBC: 0 % (ref 0.0–0.2)

## 2021-05-19 LAB — T4, FREE: Free T4: 1.05 ng/dL (ref 0.61–1.12)

## 2021-05-19 LAB — FOLATE: Folate: 14.3 ng/mL (ref >5.9)

## 2021-05-19 LAB — CORTISOL-AM, BLOOD: Cortisol - AM: 9.9 ug/dL (ref 6.7–22.6)

## 2021-05-19 LAB — VITAMIN B12: Vitamin B-12: 469 pg/mL (ref 180–914)

## 2021-05-19 LAB — IRON AND TIBC
Iron: 50 ug/dL (ref 28–170)
Saturation Ratios: 16 % (ref 10.4–31.8)
TIBC: 311 ug/dL (ref 250–450)
UIBC: 261 ug/dL

## 2021-05-19 LAB — TSH: TSH: 2.372 u[IU]/mL (ref 0.350–4.500)

## 2021-05-19 LAB — FERRITIN: Ferritin: 7 ng/mL — ABNORMAL LOW (ref 11–307)

## 2021-05-19 NOTE — Progress Notes (Signed)
PROGRESS NOTE    Erin Sutton  ZOX:[id number] DOB: 02-Sep-1941 DOA: 05/12/2021 PCP: Baxter Hire, MD    Brief Narrative:  80 y.o. female with medical history significant for anxiety, depression, hypertension, hyperlipidemia, atrial fibrillation, history of left lower extremity DVT, history of bilateral pulmonary embolism, GERD, history of major depressive disorder, hypothyroid, mild to moderate dementia, constipation, presents to the ED for multiple concerns, chiefly nausea and vomiting. Her last PO intake was 05/10/21 and it was a baked potatoe that she made. She reports she has not been able to eat, keep anything down, including medications since 05/12/21 AM.  She reports that her vomitus is yellow and bitter and sour tasting. CT demonstrates evidence of mild colitis.  Patient remains hemodynamically stable but is not able to tolerate p.o. intake. Pt admitted for further management.     Today, patient denies any nausea/vomiting, abdominal pain.  Still with very poor appetite, wondering if some of his symptoms are related to her severe depression/anxiety.   Assessment & Plan:   Principal Problem:   Intractable vomiting with nausea Active Problems:   Hypothyroidism   HTN (hypertension)   GERD (gastroesophageal reflux disease)   Anxiety   Intractable nausea and vomiting   Bilateral pulmonary embolism (HCC)   Hyperlipidemia   Sleep apnea   Dementia (HCC)   Left leg DVT (HCC)   Major depressive disorder, single episode   Constipation   Hyperparathyroidism (HCC)   Gastroenteritis   Intractable nausea and vomiting Possible differentials include infectious colitis, mild ischemic colitis Currently afebrile, with no leukocytosis Labs unremarkable, procalcitonin negative CT abdomen pelvis, showing possible mild colitis Abdominal x-ray noted stool throughout the colon but no increased stool burden to suggest constipation, no bowel obstruction Discontinue Rocephin and Flagyl, as  no improvement since starting Discontinue IV fluids, continue antiemetics, simethicone, PPI Advance diet to soft GI consulted, appreciate recs, may undergo EGD/flex sig if symptoms persist further NPO at midnight Monitor closely  Hypokalemia Replace as needed   Hypertension BP stable Continue Aldactone 25 daily, amlodipine 10 daily  History of bilateral pulmonary embolism, left lower extremity DVT, and atrial fibrillation Hold Eliquis 5 mg p.o. twice daily pending follow-up work-up for GI  Normocytic anemia Baseline hemoglobin normal, mild drop noted No evidence of bleeding Anemia panel normal except for ferritin 7 Daily CBC  Hypothyroid levothyroxine 50 mcg daily    Anxiety/panic attacks Unsure if this is contributing to her overall symptoms Consulted psych, appreciate recs if any Continue home Ativan 0.5 mg p.o. 3 times daily as needed, celexa   GERD PPI daily    Chronic heart failure preserved ejection fraction, grade 1 diastolic dysfunction Moderate LVH Appears euvolemic and compensated at this time    DVT prophylaxis: Eliquis- hold for now Code Status: Full Family Communication: None at bedside Disposition Plan: Status is: Inpatient  Inpatient because: IV treatments appropriate due to intensity of illness or inability to take PO and Inpatient level of care appropriate due to severity of illness  Dispo: The patient is from: Home              Anticipated d/c is to: Home              Patient currently is not medically stable to d/c.   Difficult to place patient No   Consultants:  GI  Procedures:  None  Antimicrobials:  None   Objective: Vitals:   05/19/21 0340 05/19/21 0740 05/19/21 1506 05/19/21 1507  BP: 119/71 (!) 141/69 Marland Kitchen)  95/57 (!) 85/59  Pulse: 62 72 68 65  Resp: 16 15 16    Temp: 98.6 F (37 C) 98 F (36.7 C) 98.3 F (36.8 C)   TempSrc: Oral Oral Oral   SpO2: 97% 93% 95%   Weight:      Height:        Intake/Output Summary (Last 24  hours) at 05/19/2021 1613 Last data filed at 05/19/2021 1404 Gross per 24 hour  Intake 546.88 ml  Output 1000 ml  Net -453.12 ml   Filed Weights   05/12/21 1020 05/12/21 2354  Weight: 74.4 kg 72.1 kg    Examination: General: NAD, deconditioned Cardiovascular: S1, S2 present Respiratory: CTAB Abdomen: Soft, nontender, nondistended, bowel sounds present Musculoskeletal: No bilateral pedal edema noted Skin: Normal Psychiatry: Fair mood      Data Reviewed: I have personally reviewed following labs and imaging studies  CBC: Recent Labs  Lab 05/13/21 0414 05/15/21 1002 05/16/21 0617 05/18/21 0439 05/19/21 0522  WBC 7.3 6.7 7.0 5.7 6.4  NEUTROABS  --  5.2 4.4 3.4 3.7  HGB 11.8* 12.3 10.5* 10.9* 11.1*  HCT 35.0* 37.3 32.2* 33.1* 32.6*  MCV 89.3 91.9 93.9 91.9 90.6  PLT 279 247 233 211 211   Basic Metabolic Panel: Recent Labs  Lab 05/13/21 0414 05/15/21 1002 05/16/21 0617 05/18/21 0439 05/19/21 0522  NA 136 139 142 139 138  K 3.6 3.3* 3.9 3.8 3.6  CL 103 106 110 105 104  CO2 26 26 27 29 30   GLUCOSE 91 119* 95 92 89  BUN 9 <5* <5* 8 10  CREATININE 0.88 0.79 0.92 0.81 0.87  CALCIUM 8.9 8.9 8.5* 9.2 9.2   GFR: Estimated Creatinine Clearance: 46.4 mL/min (by C-G formula based on SCr of 0.87 mg/dL). Liver Function Tests: No results for input(s): AST, ALT, ALKPHOS, BILITOT, PROT, ALBUMIN in the last 168 hours.  No results for input(s): LIPASE, AMYLASE in the last 168 hours.  No results for input(s): AMMONIA in the last 168 hours. Coagulation Profile: No results for input(s): INR, PROTIME in the last 168 hours. Cardiac Enzymes: No results for input(s): CKTOTAL, CKMB, CKMBINDEX, TROPONINI in the last 168 hours. BNP (last 3 results) No results for input(s): PROBNP in the last 8760 hours. HbA1C: No results for input(s): HGBA1C in the last 72 hours. CBG: Recent Labs  Lab 05/12/21 1757  GLUCAP 116*   Lipid Profile: No results for input(s): CHOL, HDL, LDLCALC,  TRIG, CHOLHDL, LDLDIRECT in the last 72 hours. Thyroid Function Tests: Recent Labs    05/19/21 0522  TSH 2.372  FREET4 1.05   Anemia Panel: Recent Labs    05/19/21 0522 05/19/21 0525  VITAMINB12  --  469  FOLATE 14.3  --   FERRITIN 7*  --   TIBC 311  --   IRON 50  --    Sepsis Labs: Recent Labs  Lab 05/13/21 0414  PROCALCITON <0.10    Recent Results (from the past 240 hour(s))  Resp Panel by RT-PCR (Flu A&B, Covid) Nasopharyngeal Swab     Status: None   Collection Time: 05/12/21  3:46 PM   Specimen: Nasopharyngeal Swab; Nasopharyngeal(NP) swabs in vial transport medium  Result Value Ref Range Status   SARS Coronavirus 2 by RT PCR NEGATIVE NEGATIVE Final    Comment: (NOTE) SARS-CoV-2 target nucleic acids are NOT DETECTED.  The SARS-CoV-2 RNA is generally detectable in upper respiratory specimens during the acute phase of infection. The lowest concentration of SARS-CoV-2 viral copies this assay can  detect is 138 copies/mL. A negative result does not preclude SARS-Cov-2 infection and should not be used as the sole basis for treatment or other patient management decisions. A negative result may occur with  improper specimen collection/handling, submission of specimen other than nasopharyngeal swab, presence of viral mutation(s) within the areas targeted by this assay, and inadequate number of viral copies(<138 copies/mL). A negative result must be combined with clinical observations, patient history, and epidemiological information. The expected result is Negative.  Fact Sheet for Patients:  EntrepreneurPulse.com.au  Fact Sheet for Healthcare Providers:  IncredibleEmployment.be  This test is no t yet approved or cleared by the [place] FDA and  has been authorized for detection and/or diagnosis of SARS-CoV-2 by FDA under an Emergency Use Authorization (EUA). This EUA will remain  in effect (meaning this test can be used) for  the duration of the COVID-19 declaration under Section 564(b)(1) of the Act, 21 U.S.C.section 360bbb-3(b)(1), unless the authorization is terminated  or revoked sooner.       Influenza A by PCR NEGATIVE NEGATIVE Final   Influenza B by PCR NEGATIVE NEGATIVE Final    Comment: (NOTE) The Xpert Xpress SARS-CoV-2/FLU/RSV plus assay is intended as an aid in the diagnosis of influenza from Nasopharyngeal swab specimens and should not be used as a sole basis for treatment. Nasal washings and aspirates are unacceptable for Xpert Xpress SARS-CoV-2/FLU/RSV testing.  Fact Sheet for Patients: EntrepreneurPulse.com.au  Fact Sheet for Healthcare Providers: IncredibleEmployment.be  This test is not yet approved or cleared by the [place] FDA and has been authorized for detection and/or diagnosis of SARS-CoV-2 by FDA under an Emergency Use Authorization (EUA). This EUA will remain in effect (meaning this test can be used) for the duration of the COVID-19 declaration under Section 564(b)(1) of the Act, 21 U.S.C. section 360bbb-3(b)(1), unless the authorization is terminated or revoked.  Performed at [facility], [address], [place], [place] [address]          Radiology Studies: No results found.      Scheduled Meds:  amLODipine  10 mg Oral Daily   citalopram  10 mg Oral Daily   levothyroxine  50 mcg Oral QAC breakfast   pantoprazole  40 mg Oral BID   polyethylene glycol  17 g Oral Daily   senna-docusate  1 tablet Oral BID   simethicone  80 mg Oral QID   traZODone  50 mg Oral QHS   Continuous Infusions:  promethazine (PHENERGAN) injection (IM or IVPB) Stopped (05/15/21 1726)     LOS: 5 days     Alma Friendly, MD [facility]  If 7PM-7AM, please contact night-coverage 05/19/2021, 4:13 PM

## 2021-05-19 NOTE — Progress Notes (Signed)
GI Inpatient Follow-up Note  Subjective:  Patient seen and doing better. Denies any nausea/vomiting. Tolerating food.  Scheduled Inpatient Medications:   amLODipine  10 mg Oral Daily   citalopram  10 mg Oral Daily   levothyroxine  50 mcg Oral QAC breakfast   pantoprazole  40 mg Oral BID   polyethylene glycol  17 g Oral Daily   senna-docusate  1 tablet Oral BID   simethicone  80 mg Oral QID   traZODone  50 mg Oral QHS    Continuous Inpatient Infusions:    cefTRIAXone (ROCEPHIN)  IV Stopped (05/18/21 1414)   metronidazole 500 mg (05/19/21 0423)   promethazine (PHENERGAN) injection (IM or IVPB) Stopped (05/15/21 1726)    PRN Inpatient Medications:  alum & mag hydroxide-simeth, fluticasone, HYDROcodone-acetaminophen, LORazepam, ondansetron **OR** ondansetron (ZOFRAN) IV, polyvinyl alcohol, promethazine (PHENERGAN) injection (IM or IVPB)  Review of Systems:  Review of Systems  Constitutional:  Negative for chills and fever.  Respiratory:  Negative for cough.   Cardiovascular:  Negative for chest pain.  Gastrointestinal:  Negative for abdominal pain, blood in stool, constipation, diarrhea, heartburn, melena, nausea and vomiting.  Musculoskeletal:  Negative for joint pain.  Skin:  Negative for itching and rash.  Neurological:  Negative for focal weakness.  Psychiatric/Behavioral:  Negative for substance abuse.   All other systems reviewed and are negative.    Physical Examination: BP (!) 141/69 (BP Location: Left Arm)   Pulse 72   Temp 98 F (36.7 C) (Oral)   Resp 15   Ht 5' (1.524 m)   Wt 72.1 kg   SpO2 93%   BMI 31.05 kg/m  Gen: NAD, alert and oriented x 4 HEENT: PEERLA, EOMI, Neck: supple, no JVD or thyromegaly Chest: No respiratory distress CV: RRR Abd: soft, non-tender, non-distended Ext: no edema, well perfused with 2+ pulses, Skin: no rash or lesions noted Lymph: no LAD  Data: Lab Results  Component Value Date   WBC 6.4 05/19/2021   HGB 11.1 (L)  05/19/2021   HCT 32.6 (L) 05/19/2021   MCV 90.6 05/19/2021   PLT 226 05/19/2021   Recent Labs  Lab 05/16/21 0617 05/18/21 0439 05/19/21 0522  HGB 10.5* 10.9* 11.1*   Lab Results  Component Value Date   NA 138 05/19/2021   K 3.6 05/19/2021   CL 104 05/19/2021   CO2 30 05/19/2021   BUN 10 05/19/2021   CREATININE 0.87 05/19/2021   Lab Results  Component Value Date   ALT 40 05/12/2021   AST 50 (H) 05/12/2021   ALKPHOS 79 05/12/2021   BILITOT 0.9 05/12/2021   No results for input(s): APTT, INR, PTT in the last 168 hours. Assessment/Plan: Erin Sutton is a 80 y.o. lady with history of depression with nausea that seems to have improved. Concerned that a lot of her symptoms are functional in nature. Does have a low ferritin  Recommendations:  - will not plan on EGD for now but make NPO at midnight in case symptoms recur - will need outpatient GI follow-up for iron deficiency - ok to discontinue antibiotics as not sure if actually treating anything  Will continue to follow, please call with any questions or concerns.  Raylene Miyamoto MD, MPH [place]

## 2021-05-19 NOTE — Plan of Care (Signed)
Continuing with plan of care. 

## 2021-05-20 DIAGNOSIS — F411 Generalized anxiety disorder: Secondary | ICD-10-CM

## 2021-05-20 DIAGNOSIS — G4733 Obstructive sleep apnea (adult) (pediatric): Secondary | ICD-10-CM

## 2021-05-20 DIAGNOSIS — E039 Hypothyroidism, unspecified: Secondary | ICD-10-CM

## 2021-05-20 DIAGNOSIS — F419 Anxiety disorder, unspecified: Secondary | ICD-10-CM

## 2021-05-20 LAB — CBC WITH DIFFERENTIAL/PLATELET
Abs Immature Granulocytes: 0.01 10*3/uL (ref 0.00–0.07)
Basophils Absolute: 0.1 10*3/uL (ref 0.0–0.1)
Basophils Relative: 1 %
Eosinophils Absolute: 0.1 10*3/uL (ref 0.0–0.5)
Eosinophils Relative: 2 %
HCT: 33 % — ABNORMAL LOW (ref 36.0–46.0)
Hemoglobin: 10.9 g/dL — ABNORMAL LOW (ref 12.0–15.0)
Immature Granulocytes: 0 %
Lymphocytes Relative: 23 %
Lymphs Abs: 1.5 10*3/uL (ref 0.7–4.0)
MCH: 30.4 pg (ref 26.0–34.0)
MCHC: 33 g/dL (ref 30.0–36.0)
MCV: 91.9 fL (ref 80.0–100.0)
Monocytes Absolute: 0.7 10*3/uL (ref 0.1–1.0)
Monocytes Relative: 10 %
Neutro Abs: 4 10*3/uL (ref 1.7–7.7)
Neutrophils Relative %: 64 %
Platelets: 222 10*3/uL (ref 150–400)
RBC: 3.59 MIL/uL — ABNORMAL LOW (ref 3.87–5.11)
RDW: 14.8 % (ref 11.5–15.5)
WBC: 6.4 10*3/uL (ref 4.0–10.5)
nRBC: 0 % (ref 0.0–0.2)

## 2021-05-20 LAB — BASIC METABOLIC PANEL
Anion gap: 5 (ref 5–15)
BUN: 13 mg/dL (ref 8–23)
CO2: 27 mmol/L (ref 22–32)
Calcium: 9.1 mg/dL (ref 8.9–10.3)
Chloride: 107 mmol/L (ref 98–111)
Creatinine, Ser: 0.79 mg/dL (ref 0.44–1.00)
GFR, Estimated: >60 mL/min (ref >60)
Glucose, Bld: 93 mg/dL (ref 70–99)
Potassium: 3.6 mmol/L (ref 3.5–5.1)
Sodium: 139 mmol/L (ref 135–145)

## 2021-05-20 MED ORDER — ONDANSETRON 8 MG PO TBDP: 8.0000 mg | ORAL_TABLET | Freq: Three times a day (TID) | ORAL | 0 refills | Status: DC | PRN

## 2021-05-20 NOTE — Progress Notes (Signed)
Order received from Dr Haig Prophet to start patient on a soft diet since she is feeling better this am

## 2021-05-20 NOTE — Care Management Important Message (Signed)
Important Message  Patient Details  Name: Erin Sutton MRN: [id number] Date of Birth: 08/11/1941   Medicare Important Message Given:  Yes     [name] 05/20/2021, 1:15 PM

## 2021-05-20 NOTE — Discharge Summary (Signed)
Discharge Summary  Erin Sutton:[id number] DOB: 10/17/1941  PCP: Baxter Hire, MD  Admit date: 05/12/2021 Discharge date: 05/20/2021  Time spent: 40 mins  Recommendations for Outpatient Follow-up:  PCP in 1 week  Discharge Diagnoses:  Active Hospital Problems   Diagnosis Date Noted   Generalized anxiety disorder 05/20/2021   Intractable vomiting with nausea 05/12/2021   Gastroenteritis 04/14/2020   Major depressive disorder, single episode 07/27/2019   Left leg DVT ([place]) 07/27/2019   Bilateral pulmonary embolism ([place]) 07/25/2019   Intractable nausea and vomiting 07/02/2019   Anxiety 06/30/2018   HTN (hypertension) 06/30/2018   GERD (gastroesophageal reflux disease) 06/30/2018   Hypothyroidism 06/30/2018   Hyperparathyroidism ([place]) 02/24/2017   Dementia ([place]) 01/01/2017   Constipation 05/21/2015   Hyperlipidemia 07/07/2014   Sleep apnea 07/07/2014    Resolved Hospital Problems  No resolved problems to display.    Discharge Condition: Stable  Diet recommendation: Heart healthy  Vitals:   05/20/21 0409 05/20/21 0700  BP: (!) 114/54 136/63  Pulse: 60 74  Resp: 20 17  Temp: 98.6 F (37 C) 98.4 F (36.9 C)  SpO2: 96% 98%    History of present illness:  80 y.o. female with medical history significant for anxiety, depression, hypertension, hyperlipidemia, atrial fibrillation, history of left lower extremity DVT, history of bilateral pulmonary embolism, GERD, history of major depressive disorder, hypothyroid, mild to moderate dementia, constipation, presents to the ED for multiple concerns, chiefly nausea and vomiting. Her last PO intake was 05/10/21 and it was a baked potatoe that she made. She reports she has not been able to eat, keep anything down, including medications since 05/12/21 AM.  She reports that her vomitus is yellow and bitter and sour tasting. CT demonstrates evidence of mild colitis.  Patient remains hemodynamically stable but is not able to  tolerate p.o. intake. Pt admitted for further management.    Today, patient denies any further nausea/vomiting, abdominal pain.  Patient was able to tolerate soft diet, denies any chest pain, shortness of breath, fever/chills.  Patient stable to be discharged home with home health.  Discussed discharge plans with daughter who was unable to pick her up as she is currently at work.  Transportation has been arranged by other family members/friends.    Hospital Course:  Principal Problem:   Generalized anxiety disorder Active Problems:   Hypothyroidism   HTN (hypertension)   GERD (gastroesophageal reflux disease)   Anxiety   Intractable nausea and vomiting   Bilateral pulmonary embolism (H[facility])   Hyperlipidemia   Sleep apnea   Dementia (HCC)   Left leg DVT (H[facility])   Major depressive disorder, single episode   Constipation   Hyperparathyroidism (H[facility])   Gastroenteritis   Intractable vomiting with nausea   Intractable nausea and vomiting Resolved Possible differentials include infectious colitis, mild ischemic colitis Currently afebrile, with no leukocytosis Labs unremarkable, procalcitonin negative CT abdomen pelvis, showing possible mild colitis Abdominal x-ray noted stool throughout the colon but no increased stool burden to suggest constipation, no bowel obstruction Discontinue Rocephin and Flagyl, as no improvement since starting S/P IV fluids Discharge on antiemetics, PPI Advanced diet to soft Follow up with PCP   Hypokalemia Replace as needed   Hypertension BP stable Continue Aldactone 25 daily, amlodipine 10 daily   History of bilateral pulmonary embolism, left lower extremity DVT, and atrial fibrillation Continue Eliquis 5 mg p.o. twice daily   Normocytic anemia Baseline hemoglobin normal, mild drop noted No evidence of bleeding Anemia panel normal except  for ferritin 7 Follow up with PCP   Hypothyroid Levothyroxine 50 mcg daily   Anxiety/panic  attacks Unsure if this is contributing to her overall symptoms Consulted psych, appreciate recs Continue home Ativan 0.5 mg p.o. 3 times daily as needed, celexa   GERD PPI daily   Chronic heart failure preserved ejection fraction, grade 1 diastolic dysfunction Moderate LVH Appears euvolemic and compensated at this time        Estimated body mass index is 31.05 kg/m as calculated from the following:   Height as of this encounter: 5' (1.524 m).   Weight as of this encounter: 72.1 kg.    Procedures: None  Consultations: GI  Discharge Exam: BP 136/63 (BP Location: Right Arm)   Pulse 74   Temp 98.4 F (36.9 C) (Oral)   Resp 17   Ht 5' (1.524 m)   Wt 72.1 kg   SpO2 98%   BMI 31.05 kg/m   General: NAD, deconditioned  Cardiovascular: S1, S2 present Respiratory: CTAB Abdomen: Soft, nontender, nondistended, bowel sounds present Musculoskeletal: No bilateral pedal edema noted Skin: Normal Psychiatry: Normal mood    Discharge Instructions You were cared for by a hospitalist during your hospital stay. If you have any questions about your discharge medications or the care you received while you were in the hospital after you are discharged, you can call the unit and asked to speak with the hospitalist on call if the hospitalist that took care of you is not available. Once you are discharged, your primary care physician will handle any further medical issues. Please note that NO REFILLS for any discharge medications will be authorized once you are discharged, as it is imperative that you return to your primary care physician (or establish a relationship with a primary care physician if you do not have one) for your aftercare needs so that they can reassess your need for medications and monitor your lab values.   Allergies as of 05/20/2021       Reactions   Ace Inhibitors Cough   Levofloxacin    Penicillin G Other (See Comments)   Tape Other (See Comments)   Other  reaction(s): Other (See Comments)   Amoxicillin Rash   Has patient had a PCN reaction causing immediate rash, facial/tongue/throat swelling, SOB or lightheadedness with hypotension: No Has patient had a PCN reaction causing severe rash involving mucus membranes or skin necrosis: No Has patient had a PCN reaction that required hospitalization: No Has patient had a PCN reaction occurring within the last 10 years: No If all of the above answers are "NO", then may proceed with Cephalosporin use.   Benicar [olmesartan] Rash   Itching rash   Codeine Rash   Codeine Sulfate Rash   Levaquin [levofloxacin In D5w] Rash   Alters mental status   Penicillin V Potassium Rash   Has patient had a PCN reaction causing immediate rash, facial/tongue/throat swelling, SOB or lightheadedness with hypotension: No Has patient had a PCN reaction causing severe rash involving mucus membranes or skin necrosis: No Has patient had a PCN reaction that required hospitalization: No Has patient had a PCN reaction occurring within the last 10 years: No If all of the above answers are "NO", then may proceed with Cephalosporin use.   Sulfa Antibiotics Rash        Medication List     STOP taking these medications    carvedilol 3.125 MG tablet Commonly known as: COREG   isosorbide mononitrate 30 MG 24 hr tablet  Commonly known as: IMDUR   metoCLOPramide 5 MG tablet Commonly known as: Reglan   ondansetron 8 MG tablet Commonly known as: ZOFRAN       TAKE these medications    acetaminophen 500 MG tablet Commonly known as: TYLENOL Take 500 mg by mouth daily as needed.   amLODipine 10 MG tablet Commonly known as: NORVASC Take 10 mg by mouth daily.   atorvastatin 40 MG tablet Commonly known as: LIPITOR Take 1 tablet by mouth daily.   citalopram 20 MG tablet Commonly known as: CELEXA Take 10 mg by mouth daily.   cyanocobalamin 1000 MCG/ML injection Commonly known as: (VITAMIN B-12) Inject 1,000 mcg  into the muscle See admin instructions. Inject 1067mcg once weekly for 4 weeks then recheck by Dr Wynetta Emery   fluticasone 50 MCG/ACT nasal spray Commonly known as: FLONASE Place 1 spray into both nostrils daily.   levothyroxine 50 MCG tablet Commonly known as: SYNTHROID Take 50 mcg by mouth daily before breakfast.   LORazepam 0.5 MG tablet Commonly known as: ATIVAN Take 0.5 mg by mouth 3 (three) times daily as needed.   ondansetron 8 MG disintegrating tablet Commonly known as: Zofran ODT Take 1 tablet (8 mg total) by mouth every 8 (eight) hours as needed for nausea or vomiting.   pantoprazole 40 MG tablet Commonly known as: PROTONIX Take 40 mg by mouth daily.   polyethylene glycol powder 17 GM/SCOOP powder Commonly known as: GLYCOLAX/MIRALAX Take 17 g by mouth daily.   potassium chloride SA 20 MEQ tablet Commonly known as: KLOR-CON Take 20 mEq by mouth every morning.   senna-docusate 8.6-50 MG tablet Commonly known as: Senokot-S Take 2 tablets by mouth 2 (two) times daily as needed for mild constipation.   spironolactone 25 MG tablet Commonly known as: ALDACTONE Take 25 mg by mouth daily.   SYSTANE OP Place 1 drop into both eyes 2 (two) times daily.   traZODone 50 MG tablet Commonly known as: DESYREL Take 50 mg by mouth at bedtime.       ASK your doctor about these medications    apixaban 2.5 MG Tabs tablet Commonly known as: Eliquis Take 1 tablet (2.5 mg total) by mouth 2 (two) times daily.       Allergies  Allergen Reactions   Ace Inhibitors Cough   Levofloxacin    Penicillin G Other (See Comments)   Tape Other (See Comments)    Other reaction(s): Other (See Comments)   Amoxicillin Rash    Has patient had a PCN reaction causing immediate rash, facial/tongue/throat swelling, SOB or lightheadedness with hypotension: No Has patient had a PCN reaction causing severe rash involving mucus membranes or skin necrosis: No Has patient had a PCN reaction that  required hospitalization: No Has patient had a PCN reaction occurring within the last 10 years: No If all of the above answers are "NO", then may proceed with Cephalosporin use.    Benicar [Olmesartan] Rash    Itching rash   Codeine Rash   Codeine Sulfate Rash   Levaquin [Levofloxacin In D5w] Rash    Alters mental status   Penicillin V Potassium Rash    Has patient had a PCN reaction causing immediate rash, facial/tongue/throat swelling, SOB or lightheadedness with hypotension: No Has patient had a PCN reaction causing severe rash involving mucus membranes or skin necrosis: No Has patient had a PCN reaction that required hospitalization: No Has patient had a PCN reaction occurring within the last 10 years: No If all of the  above answers are "NO", then may proceed with Cephalosporin use.    Sulfa Antibiotics Rash    Follow-up Information     Baxter Hire, MD. Schedule an appointment as soon as possible for a visit in 1 week(s).   Specialty: Internal Medicine Contact information: [place] [address] [phone]                  The results of significant diagnostics from this hospitalization (including imaging, microbiology, ancillary and laboratory) are listed below for reference.    Significant Diagnostic Studies: CT ABDOMEN PELVIS WO CONTRAST  Result Date: 05/12/2021 CLINICAL DATA:  Nausea vomiting. EXAM: CT ABDOMEN AND PELVIS WITHOUT CONTRAST TECHNIQUE: Multidetector CT imaging of the abdomen and pelvis was performed following the standard protocol without IV contrast. COMPARISON:  Apr 13, 2020 FINDINGS: Lower chest: No acute abnormality. Hepatobiliary: Normal appearance of the liver. Sub pathologic distension of the gallbladder measuring 4 cm transversely. Pancreas: Unremarkable. No pancreatic ductal dilatation or surrounding inflammatory changes. Spleen: Normal in size without focal abnormality. Adrenals/Urinary Tract: Adrenal glands are  unremarkable. Kidneys are normal, without renal calculi, focal lesion, or hydronephrosis. Bladder is unremarkable. Stomach/Bowel: Stomach is within normal limits. Appendix appears normal. No evidence of small bowel wall thickening, distention, or inflammatory changes. Diffuse circumferential wall thickening of the sigmoid colon, on the background of extensive diverticulosis. No evidence of significant pericolonic inflammatory changes. Vascular/Lymphatic: Aortic atherosclerosis. No enlarged abdominal or pelvic lymph nodes. Reproductive: Leiomyomatous uterus. Other: No abdominal wall hernia or abnormality. No abdominopelvic ascites. Musculoskeletal: Scoliosis and spondylosis of the spine. IMPRESSION: 1. Sub pathologic distension of the gallbladder measuring 4 cm transversely. No CT evidence of acute cholecystitis. 2. Diffuse circumferential wall thickening of the sigmoid colon, on the background of extensive diverticulosis, probable colitis. 3. Leiomyomatous uterus. 4. Scoliosis and spondylosis of the spine. 5. Aortic atherosclerosis. Aortic Atherosclerosis (ICD10-I70.0). Electronically Signed   By: Fidela Salisbury M.D.   On: 05/12/2021 16:22   DG Chest 2 View  Result Date: 05/12/2021 CLINICAL DATA:  Chest pain. Left center chest tightness for 2 years. History of atrial fibrillation. EXAM: CHEST - 2 VIEW COMPARISON:  Prior chest radiographs 02/27/2021 and earlier. FINDINGS: Heart size at the upper limits of normal, unchanged. Aortic atherosclerosis. No appreciable airspace consolidation or pulmonary edema. No evidence of pleural effusion or pneumothorax. No acute bony abnormality identified. Bilateral glenohumeral joint osteoarthrosis. Degenerative changes of the spine. IMPRESSION: No evidence of acute cardiopulmonary abnormality. Aortic Atherosclerosis (ICD10-I70.0). Electronically Signed   By: Kellie Simmering DO   On: 05/12/2021 11:10   CT Head Wo Contrast  Result Date: 05/12/2021 CLINICAL DATA:  Head  trauma, intracranial venous injury suspected. EXAM: CT HEAD WITHOUT CONTRAST TECHNIQUE: Contiguous axial images were obtained from the base of the skull through the vertex without intravenous contrast. COMPARISON:  Brain MRI 02/27/2021. Prior head CT examinations 02/27/2021 and earlier. FINDINGS: Brain: Cerebral volume is normal for age. 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 Redemonstrated small chronic left thalamic lacunar infarct. There is no acute intracranial hemorrhage. No acute demarcated cortical infarct. No extra-axial fluid collection. No evidence of intracranial mass. No midline shift. Vascular: No hyperdense vessel.  Atherosclerotic calcifications. Skull: Normal. Negative for fracture or focal lesion. Sinuses/Orbits: Visualized orbits show no acute finding. Trace bilateral ethmoid sinus mucosal thickening. IMPRESSION: No evidence of acute intracranial abnormality. 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 Redemonstrated chronic left thalamic lacunar infarct. Electronically Signed   By:  Kellie Simmering DO   On: 05/12/2021 16:23   DG Abd Portable 1V  Result Date: 05/15/2021 CLINICAL DATA:  Constipation. EXAM: PORTABLE ABDOMEN - 1 VIEW COMPARISON:  CT 3 days ago. FINDINGS: No abnormal gastric distension. No small bowel dilatation. Air-filled ascending, transverse, and descending colon without abnormal distension. Small volume of stool in the rectum. No increased stool burden to suggest constipation. No radiopaque calculi. Degenerative change in the spine. IMPRESSION: 1. Small volume of stool in the rectum. Otherwise paucity of formed stool throughout the colon, no increased stool burden to suggest constipation. 2. No bowel obstruction. Electronically Signed   By: Keith Rake M.D.   On: 05/15/2021 17:59   US ABDOMEN LIMITED RUQ (LIVER/GB)  Result Date: 05/12/2021 CLINICAL DATA:  Right upper quadrant pain  for 2 weeks, distended gallbladder on recent CT examination. EXAM: ULTRASOUND ABDOMEN LIMITED RIGHT UPPER QUADRANT COMPARISON:  CT from earlier in the same day. FINDINGS: Gallbladder: No gallstones or wall thickening visualized. No sonographic Murphy sign noted by sonographer. Gallbladder sludge is noted. Common bile duct: Diameter: 4.8 mm. Liver: Mildly increased in echogenicity consistent with fatty infiltration. No mass is noted. Portal vein is patent on color Doppler imaging with normal direction of blood flow towards the liver. Other: None. IMPRESSION: Fatty liver without focal mass. Gallbladder sludge. No complicating factors in the gallbladder are noted. Electronically Signed   By: Inez Catalina M.D.   On: 05/12/2021 17:29    Microbiology: Recent Results (from the past 240 hour(s))  Resp Panel by RT-PCR (Flu A&B, Covid) Nasopharyngeal Swab     Status: None   Collection Time: 05/12/21  3:46 PM   Specimen: Nasopharyngeal Swab; Nasopharyngeal(NP) swabs in vial transport medium  Result Value Ref Range Status   SARS Coronavirus 2 by RT PCR NEGATIVE NEGATIVE Final    Comment: (NOTE) SARS-CoV-2 target nucleic acids are NOT DETECTED.  The SARS-CoV-2 RNA is generally detectable in upper respiratory specimens during the acute phase of infection. The lowest concentration of SARS-CoV-2 viral copies this assay can detect is 138 copies/mL. A negative result does not preclude SARS-Cov-2 infection and should not be used as the sole basis for treatment or other patient management decisions. A negative result may occur with  improper specimen collection/handling, submission of specimen other than nasopharyngeal swab, presence of viral mutation(s) within the areas targeted by this assay, and inadequate number of viral copies(<138 copies/mL). A negative result must be combined with clinical observations, patient history, and epidemiological information. The expected result is Negative.  Fact Sheet for  Patients:  EntrepreneurPulse.com.au  Fact Sheet for Healthcare Providers:  IncredibleEmployment.be  This test is no t yet approved or cleared by the [place] FDA and  has been authorized for detection and/or diagnosis of SARS-CoV-2 by FDA under an Emergency Use Authorization (EUA). This EUA will remain  in effect (meaning this test can be used) for the duration of the COVID-19 declaration under Section 564(b)(1) of the Act, 21 U.S.C.section 360bbb-3(b)(1), unless the authorization is terminated  or revoked sooner.       Influenza A by PCR NEGATIVE NEGATIVE Final   Influenza B by PCR NEGATIVE NEGATIVE Final    Comment: (NOTE) The Xpert Xpress SARS-CoV-2/FLU/RSV plus assay is intended as an aid in the diagnosis of influenza from Nasopharyngeal swab specimens and should not be used as a sole basis for treatment. Nasal washings and aspirates are unacceptable for Xpert Xpress SARS-CoV-2/FLU/RSV testing.  Fact Sheet for Patients: EntrepreneurPulse.com.au  Fact Sheet for Healthcare  Providers: IncredibleEmployment.be  This test is not yet approved or cleared by the [place] and has been authorized for detection and/or diagnosis of SARS-CoV-2 by FDA under an Emergency Use Authorization (EUA). This EUA will remain in effect (meaning this test can be used) for the duration of the COVID-19 declaration under Section 564(b)(1) of the Act, 21 U.S.C. section 360bbb-3(b)(1), unless the authorization is terminated or revoked.  Performed at [facility], [address] [place], [place] [address]      Labs: Basic Metabolic Panel: Recent Labs  Lab 05/15/21 1002 05/16/21 0617 05/18/21 0439 05/19/21 0522 05/20/21 0639  NA 139 142 139 138 139  K 3.3* 3.9 3.8 3.6 3.6  CL 106 110 105 104 107  CO2 26 27 29 30 27   GLUCOSE 119* 95 92 89 93  BUN <5* <5* 8 10 13   CREATININE 0.79 0.92 0.81 0.87  0.79  CALCIUM 8.9 8.5* 9.2 9.2 9.1   Liver Function Tests: No results for input(s): AST, ALT, ALKPHOS, BILITOT, PROT, ALBUMIN in the last 168 hours. No results for input(s): LIPASE, AMYLASE in the last 168 hours. No results for input(s): AMMONIA in the last 168 hours. CBC: Recent Labs  Lab 05/15/21 1002 05/16/21 0617 05/18/21 0439 05/19/21 0522 05/20/21 0639  WBC 6.7 7.0 5.7 6.4 6.4  NEUTROABS 5.2 4.4 3.4 3.7 4.0  HGB 12.3 10.5* 10.9* 11.1* 10.9*  HCT 37.3 32.2* 33.1* 32.6* 33.0*  MCV 91.9 93.9 91.9 90.6 91.9  PLT 247 233 211 226 222   Cardiac Enzymes: No results for input(s): CKTOTAL, CKMB, CKMBINDEX, TROPONINI in the last 168 hours. BNP: BNP (last 3 results) No results for input(s): BNP in the last 8760 hours.  ProBNP (last 3 results) No results for input(s): PROBNP in the last 8760 hours.  CBG: No results for input(s): GLUCAP in the last 168 hours.     Signed:  Alma Friendly, MD [facility] 05/20/2021, 4:19 PM

## 2021-05-20 NOTE — TOC Transition Note (Signed)
Transition of Care [facility]) - CM/SW Discharge Note   Patient Details  Name: Erin Sutton MRN: [id number] Date of Birth: 05-May-1941  Transition of Care [facility] CM/SW Contact:  Erin Sessions, RN Phone Number: 05/20/2021, 4:16 PM   Clinical Narrative:        Patient to discharge today Tommi Rumps with [facility] notified of discharge Patient has confirmed that she has a ride home at discharge      Patient Goals and CMS Choice        Discharge Placement                       Discharge Plan and Services                          HH Arranged: PT [place]: [facility]        Social Determinants of Health (SDOH) Interventions     Readmission Risk Interventions No flowsheet data found.

## 2021-05-20 NOTE — Consult Note (Signed)
[name]   Reason for Consult: Consult 80 year old woman in the hospital for nausea and vomiting.  Question about anxiety and depression Referring Physician:  Horris Latino Patient Identification: Erin Sutton MRN:  [id number] Principal Diagnosis: Generalized anxiety disorder Diagnosis:  Principal Problem:   Generalized anxiety disorder Active Problems:   Hypothyroidism   HTN (hypertension)   GERD (gastroesophageal reflux disease)   Anxiety   Intractable nausea and vomiting   Bilateral pulmonary embolism (HCC)   Hyperlipidemia   Sleep apnea   Dementia (HCC)   Left leg DVT (HCC)   Major depressive disorder, single episode   Constipation   Hyperparathyroidism ([place])   Gastroenteritis   Intractable vomiting with nausea   Total Time spent with patient: 1 hour  Subjective:   Erin Sutton is a 80 y.o. female patient admitted with "I was throwing up".  HPI: Patient seen chart reviewed.  81 year old woman admitted to the hospital because of reports of intractable nausea and vomiting.  Consult because of history of anxiety and depression.  Patient tells me she had been feeling sick to her stomach throwing up and having trouble eating up until she came in the hospital but since Friday it has been much better and she no longer has any complaints of it.  Said that she ate her diet today without any difficulty.  Patient said she herself speculated that some of the nausea and vomiting could be related to her anxiety and depression.  Patient described anxiety as being general fleet free-floating worry and mentioned her children and grandchildren as being the thing that she worries about most even though there does not seem to be any particular reason to worry about them.  Part of it is that she has not been able to visit with him as much as she used to.  Sounds like she feels a bit lonely at home.  The anxiety happens all the time and she denied having any specific panic  attacks.  Patient described feeling "depressed" but at the same time denied losing interest in activities denied feeling hopeless and denied any suicidal thoughts at all.  Does have some chronic trouble sleeping which she treats with trazodone.  Not clear to me however that she is really getting too little sleep.  She says she will sleep for a few hours then wake up then go back to bed and sleep again.  On interview today patient describes general feelings of anxiety but denies feeling hopeless or helpless.  She did see her primary care doctor back in the early part of the month and they started her on citalopram as a treatment for depression and anxiety.  Patient herself had not made any connection between this and the symptom of nausea.  She also takes trazodone and also takes lorazepam 0.5 mg up to 3 times a day as needed at home.  Past Psychiatric History: No history of psychiatric hospitalization.  No history of suicide attempts.  No prior medications as far as she could remember other than the trazodone and lorazepam and current Celexa.  Risk to Self:   Risk to Others:   Prior Inpatient Therapy:   Prior Outpatient Therapy:    Past Medical History:  Past Medical History:  Diagnosis Date   Anxiety    Arthritis    Chronic kidney disease    Constipation    GERD (gastroesophageal reflux disease)    Hypertension    Hypothyroidism    Osteoporosis    Sleep apnea  Urinary incontinence     Past Surgical History:  Procedure Laterality Date   BREAST BIOPSY Right    neg/stereo   BREAST BIOPSY Left    neg/stereo   BREAST EXCISIONAL BIOPSY Right    neg   CATARACT EXTRACTION, BILATERAL     COLONOSCOPY WITH PROPOFOL N/A 07/02/2015   Procedure: COLONOSCOPY WITH PROPOFOL;  Surgeon: Manya Silvas, MD;  Location: [facility] ENDOSCOPY;  Service: Endoscopy;  Laterality: N/A;   CORRECTION HAMMER TOE Right    ESOPHAGOGASTRODUODENOSCOPY (EGD) WITH PROPOFOL N/A 07/02/2015   Procedure:  ESOPHAGOGASTRODUODENOSCOPY (EGD) WITH PROPOFOL;  Surgeon: Manya Silvas, MD;  Location: [facility] ENDOSCOPY;  Service: Endoscopy;  Laterality: N/A;   JOINT REPLACEMENT Left    knee   REPLACEMENT TOTAL KNEE Left    SKIN BIOPSY Right    Family History:  Family History  Problem Relation Age of Onset   Hypertension Mother    Diabetes Mother    Heart attack Mother    Asthma Mother    Peripheral vascular disease Mother    Heart attack Father    Hypertension Father    Diabetes Father    Colon cancer Maternal Aunt    Breast cancer Cousin    Family Psychiatric  History: None reported Social History:  Social History   Substance and Sexual Activity  Alcohol Use No     Social History   Substance and Sexual Activity  Drug Use No    Social History   Socioeconomic History   Marital status: Widowed    Spouse name: Not on file   Number of children: Not on file   Years of education: Not on file   Highest education level: Not on file  Occupational History   Not on file  Tobacco Use   Smoking status: Never   Smokeless tobacco: Never  Vaping Use   Vaping Use: Never used  Substance and Sexual Activity   Alcohol use: No   Drug use: No   Sexual activity: Not Currently  Other Topics Concern   Not on file  Social History Narrative   Patient lives at home by herself.  Ambulates with a cane.   Has a daughter who lives in [place] Determinants of Health   Financial Resource Strain: Not on file  Food Insecurity: Not on file  Transportation Needs: Not on file  Physical Activity: Not on file  Stress: Not on file  Social Connections: Not on file   Additional Social History:    Allergies:   Allergies  Allergen Reactions   Ace Inhibitors Cough   Levofloxacin    Penicillin G Other (See Comments)   Tape Other (See Comments)    Other reaction(s): Other (See Comments)   Amoxicillin Rash    Has patient had a PCN reaction causing immediate rash, facial/tongue/throat  swelling, SOB or lightheadedness with hypotension: No Has patient had a PCN reaction causing severe rash involving mucus membranes or skin necrosis: No Has patient had a PCN reaction that required hospitalization: No Has patient had a PCN reaction occurring within the last 10 years: No If all of the above answers are "NO", then may proceed with Cephalosporin use.    Benicar [Olmesartan] Rash    Itching rash   Codeine Rash   Codeine Sulfate Rash   Levaquin [Levofloxacin In D5w] Rash    Alters mental status   Penicillin V Potassium Rash    Has patient had a PCN reaction causing immediate rash, facial/tongue/throat swelling, SOB  or lightheadedness with hypotension: No Has patient had a PCN reaction causing severe rash involving mucus membranes or skin necrosis: No Has patient had a PCN reaction that required hospitalization: No Has patient had a PCN reaction occurring within the last 10 years: No If all of the above answers are "NO", then may proceed with Cephalosporin use.    Sulfa Antibiotics Rash    Labs:  Results for orders placed or performed during the hospital encounter of 05/12/21 (from the past 48 hour(s))  CBC with Differential/Platelet     Status: Abnormal   Collection Time: 05/19/21  5:22 AM  Result Value Ref Range   WBC 6.4 4.0 - 10.5 K/uL   RBC 3.60 (L) 3.87 - 5.11 MIL/uL   Hemoglobin 11.1 (L) 12.0 - 15.0 g/dL   HCT 32.6 (L) 36.0 - 46.0 %   MCV 90.6 80.0 - 100.0 fL   MCH 30.8 26.0 - 34.0 pg   MCHC 34.0 30.0 - 36.0 g/dL   RDW 14.8 11.5 - 15.5 %   Platelets 226 150 - 400 K/uL   nRBC 0.0 0.0 - 0.2 %   Neutrophils Relative % 58 %   Neutro Abs 3.7 1.7 - 7.7 K/uL   Lymphocytes Relative 27 %   Lymphs Abs 1.7 0.7 - 4.0 K/uL   Monocytes Relative 12 %   Monocytes Absolute 0.8 0.1 - 1.0 K/uL   Eosinophils Relative 2 %   Eosinophils Absolute 0.1 0.0 - 0.5 K/uL   Basophils Relative 1 %   Basophils Absolute 0.1 0.0 - 0.1 K/uL   Immature Granulocytes 0 %   Abs Immature  Granulocytes 0.01 0.00 - 0.07 K/uL    Comment: Performed at [facility], [address] [place], [place] [address]  Basic metabolic panel     Status: Abnormal   Collection Time: 05/19/21  5:22 AM  Result Value Ref Range   Sodium 138 135 - 145 mmol/L   Potassium 3.6 3.5 - 5.1 mmol/L   Chloride 104 98 - 111 mmol/L   CO2 30 22 - 32 mmol/L   Glucose, Bld 89 70 - 99 mg/dL    Comment: Glucose reference range applies only to samples taken after fasting for at least 8 hours.   BUN 10 8 - 23 mg/dL   Creatinine, Ser 0.87 0.44 - 1.00 mg/dL   Calcium 9.2 8.9 - 10.3 mg/dL   GFR, Estimated >60 >60 mL/min    Comment: (NOTE) Calculated using the CKD-EPI Creatinine Equation (2021)    Anion gap 4 (L) 5 - 15    Comment: Performed at [facility], [address] [place], [place] [address]  TSH     Status: None   Collection Time: 05/19/21  5:22 AM  Result Value Ref Range   TSH 2.372 0.350 - 4.500 uIU/mL    Comment: Performed by a 3rd Generation assay with a functional sensitivity of <=0.01 uIU/mL. Performed at [facility], [address] [place], [place] [address]   T4, free     Status: None   Collection Time: 05/19/21  5:22 AM  Result Value Ref Range   Free T4 1.05 0.61 - 1.12 ng/dL    Comment: (NOTE) Biotin ingestion may interfere with free T4 tests. If the results are inconsistent with the TSH level, previous test results, or the clinical presentation, then consider biotin interference. If needed, order repeat testing after stopping biotin. Performed at [facility], [address], [place], [place] [address]   Folate  Status: None   Collection Time: 05/19/21  5:22 AM  Result Value Ref Range   Folate 14.3 >5.9 ng/mL    Comment: Performed at [facility], [address] [place], [place] [address]  Iron and TIBC     Status: None   Collection Time: 05/19/21  5:22 AM  Result Value Ref Range   Iron 50 28 - 170 ug/dL   TIBC 311 250 -  450 ug/dL   Saturation Ratios 16 10.4 - 31.8 %   UIBC 261 ug/dL    Comment: Performed at [facility], [address] [place], [place] [address]  Ferritin     Status: Abnormal   Collection Time: 05/19/21  5:22 AM  Result Value Ref Range   Ferritin 7 (L) 11 - 307 ng/mL    Comment: Performed at [facility], [address] [place], [place] [address]  Cortisol-am, blood     Status: None   Collection Time: 05/19/21  5:25 AM  Result Value Ref Range   Cortisol - AM 9.9 6.7 - 22.6 ug/dL    Comment: Performed at [facility], [place] [address], [place], [place] [address]  Vitamin B12     Status: None   Collection Time: 05/19/21  5:25 AM  Result Value Ref Range   Vitamin B-12 469 180 - 914 pg/mL    Comment: (NOTE) This assay is not validated for testing neonatal or myeloproliferative syndrome specimens for Vitamin B12 levels. Performed at [facility], [place] [address] [place], [place] [address]   CBC with Differential/Platelet     Status: Abnormal   Collection Time: 05/20/21  6:39 AM  Result Value Ref Range   WBC 6.4 4.0 - 10.5 K/uL   RBC 3.59 (L) 3.87 - 5.11 MIL/uL   Hemoglobin 10.9 (L) 12.0 - 15.0 g/dL   HCT 33.0 (L) 36.0 - 46.0 %   MCV 91.9 80.0 - 100.0 fL   MCH 30.4 26.0 - 34.0 pg   MCHC 33.0 30.0 - 36.0 g/dL   RDW 14.8 11.5 - 15.5 %   Platelets 222 150 - 400 K/uL   nRBC 0.0 0.0 - 0.2 %   Neutrophils Relative % 64 %   Neutro Abs 4.0 1.7 - 7.7 K/uL   Lymphocytes Relative 23 %   Lymphs Abs 1.5 0.7 - 4.0 K/uL   Monocytes Relative 10 %   Monocytes Absolute 0.7 0.1 - 1.0 K/uL   Eosinophils Relative 2 %   Eosinophils Absolute 0.1 0.0 - 0.5 K/uL   Basophils Relative 1 %   Basophils Absolute 0.1 0.0 - 0.1 K/uL   Immature Granulocytes 0 %   Abs Immature Granulocytes 0.01 0.00 - 0.07 K/uL    Comment: Performed at [facility], [address] [place],  [address]  Basic metabolic panel     Status: None   Collection Time: 05/20/21   6:39 AM  Result Value Ref Range   Sodium 139 135 - 145 mmol/L   Potassium 3.6 3.5 - 5.1 mmol/L   Chloride 107 98 - 111 mmol/L   CO2 27 22 - 32 mmol/L   Glucose, Bld 93 70 - 99 mg/dL    Comment: Glucose reference range applies only to samples taken after fasting for at least 8 hours.   BUN 13 8 - 23 mg/dL   Creatinine, Ser 0.79 0.44 - 1.00 mg/dL   Calcium 9.1 8.9 - 10.3 mg/dL   GFR, Estimated >60 >60 mL/min  Comment: (NOTE) Calculated using the CKD-EPI Creatinine Equation (2021)    Anion gap 5 5 - 15    Comment: Performed at [facility], [address] [place], [place] [address]    Current Facility-Administered Medications  Medication Dose Route Frequency Provider Last Rate Last Admin   alum & mag hydroxide-simeth (MAALOX/MYLANTA) 200-200-20 MG/5ML suspension 30 mL  30 mL Oral Q4H PRN Ralene Muskrat B, MD   30 mL at 05/15/21 0931   citalopram (CELEXA) tablet 10 mg  10 mg Oral Daily Cox, Amy N, DO   10 mg at 05/20/21 1152   fluticasone (FLONASE) 50 MCG/ACT nasal spray 1 spray  1 spray Each Nare Daily PRN Cox, Amy N, DO       HYDROcodone-acetaminophen (NORCO/VICODIN) 5-325 MG per tablet 1 tablet  1 tablet Oral Q6H PRN Athena Masse, MD   1 tablet at 05/17/21 0754   levothyroxine (SYNTHROID) tablet 50 mcg  50 mcg Oral QAC breakfast Cox, Amy N, DO   50 mcg at 05/20/21 1152   LORazepam (ATIVAN) tablet 0.5 mg  0.5 mg Oral TID PRN Cox, Amy N, DO   0.5 mg at 05/19/21 0918   ondansetron (ZOFRAN) tablet 4 mg  4 mg Oral Q6H PRN Cox, Amy N, DO       Or   ondansetron (ZOFRAN) injection 4 mg  4 mg Intravenous Q6H PRN Cox, Amy N, DO   4 mg at 05/18/21 0845   pantoprazole (PROTONIX) EC tablet 40 mg  40 mg Oral BID Ralene Muskrat B, MD   40 mg at 05/20/21 1152   polyethylene glycol (MIRALAX / GLYCOLAX) packet 17 g  17 g Oral Daily Alma Friendly, MD   17 g at 05/20/21 1152   polyvinyl alcohol (LIQUIFILM TEARS) 1.4 % ophthalmic solution 1 drop  1 drop Both Eyes PRN Cox, Amy  N, DO   1 drop at 05/14/21 2256   promethazine (PHENERGAN) 12.5 mg in sodium chloride 0.9 % 50 mL IVPB  12.5 mg Intravenous Q6H PRN Cox, Amy N, DO   Stopped at 05/15/21 1726   senna-docusate (Senokot-S) tablet 1 tablet  1 tablet Oral BID Alma Friendly, MD   1 tablet at 05/20/21 1153   simethicone (MYLICON) chewable tablet 80 mg  80 mg Oral QID Ralene Muskrat B, MD   80 mg at 05/20/21 1402   traZODone (DESYREL) tablet 50 mg  50 mg Oral QHS Cox, Amy N, DO   50 mg at 05/19/21 2237    Musculoskeletal: Strength & Muscle Tone: within normal limits Gait & Station: normal Patient leans: N/A            Psychiatric Specialty Exam:  Presentation  General Appearance:  No data recorded Eye Contact: No data recorded Speech: No data recorded Speech Volume: No data recorded Handedness: No data recorded  Mood and Affect  Mood: No data recorded Affect: No data recorded  Thought Process  Thought Processes: No data recorded Descriptions of Associations:No data recorded Orientation:No data recorded Thought Content:No data recorded History of Schizophrenia/Schizoaffective disorder:No data recorded Duration of Psychotic Symptoms:No data recorded Hallucinations:No data recorded Ideas of Reference:No data recorded Suicidal Thoughts:No data recorded Homicidal Thoughts:No data recorded  Sensorium  Memory: No data recorded Judgment: No data recorded Insight: No data recorded  Executive Functions  Concentration: No data recorded Attention Span: No data recorded Recall: No data recorded Fund of Knowledge: No data recorded Language: No data recorded  Psychomotor Activity  Psychomotor Activity: No  data recorded  Assets  Assets: No data recorded  Sleep  Sleep: No data recorded  Physical Exam: Physical Exam Vitals and nursing note reviewed.  Constitutional:      Appearance: Normal appearance.  HENT:     Head: Normocephalic and atraumatic.      Mouth/Throat:     Pharynx: Oropharynx is clear.  Eyes:     Pupils: Pupils are equal, round, and reactive to light.  Cardiovascular:     Rate and Rhythm: Normal rate and regular rhythm.  Pulmonary:     Effort: Pulmonary effort is normal.     Breath sounds: Normal breath sounds.  Abdominal:     General: Abdomen is flat.     Palpations: Abdomen is soft.  Musculoskeletal:        General: Normal range of motion.  Skin:    General: Skin is warm and dry.  Neurological:     General: No focal deficit present.     Mental Status: She is alert. Mental status is at baseline.  Psychiatric:        Attention and Perception: Attention normal.        Mood and Affect: Mood is anxious.        Speech: Speech normal.        Behavior: Behavior normal.        Thought Content: Thought content normal.        Cognition and Memory: Memory is impaired.        Judgment: Judgment normal.   Review of Systems  Constitutional: Negative.   HENT: Negative.    Eyes: Negative.   Respiratory: Negative.    Cardiovascular: Negative.   Gastrointestinal:  Positive for nausea. Negative for vomiting.  Musculoskeletal: Negative.   Skin: Negative.   Neurological: Negative.   Psychiatric/Behavioral:  The patient is nervous/anxious.   Blood pressure 136/63, pulse 74, temperature 98.4 F (36.9 C), temperature source Oral, resp. rate 17, height 5' (1.524 m), weight 72.1 kg, SpO2 98 %. Body mass index is 31.05 kg/m.  Treatment Plan Summary: Plan 80 year old woman who describes symptoms probably more clearly consistent with generalized anxiety disorder than with a discrete major depressive episode.  Current symptoms seem to be much improved over previous baseline.  The nausea and vomiting problem has gotten better to the point of resolution in the hospital despite her continuing on the 10 mg a day of Celexa.  Spoke with the patient about how medication such as this can be helpful with anxiety but require patient's to wait  for weeks to see if it is going to work.  Patient was also educated that psychotherapy especially cognitive behavioral psychotherapy and relaxation techniques can be extremely helpful probably better than anything else in treating anxiety in elderly patients.  Encouraged to talk with her primary care doctor about that at discharge.  At this point I am not going to change any of the medicine.  Leave the low dose of citalopram and recommend she follow up with her primary care doctor.  Did not make any comments about changing the alprazolam.  In elderly patients with possible dementia it is a concern to be using benzodiazepines but this does not seem like the time to be cutting it back.  Also insofar as the question of dementia I see it mentioned in the patient's chart that she had "mild or moderate" dementia.  Did not do cognitive testing today but just from conversation it seems almost certain that she does not have moderate dementia  and does not seem terribly impaired by cognitive problems at all.  This might be an area to explore further with neuropsychological testing outside of the hospital if needed.  Patient understood plan which was to not change any medicine but refer her back to her primary care doctor.  Disposition: No evidence of imminent risk to self or others at present.   Patient does not meet criteria for psychiatric inpatient admission. Supportive therapy provided about ongoing stressors.  Alethia Berthold, MD 05/20/2021 2:25 PM

## 2021-05-21 ENCOUNTER — Other Ambulatory Visit: Payer: Self-pay

## 2021-05-21 DIAGNOSIS — F411 Generalized anxiety disorder: Secondary | ICD-10-CM

## 2021-05-23 DIAGNOSIS — M1711 Unilateral primary osteoarthritis, right knee: Secondary | ICD-10-CM | POA: Diagnosis not present

## 2021-05-23 DIAGNOSIS — I82402 Acute embolism and thrombosis of unspecified deep veins of left lower extremity: Secondary | ICD-10-CM | POA: Diagnosis not present

## 2021-05-23 DIAGNOSIS — N189 Chronic kidney disease, unspecified: Secondary | ICD-10-CM | POA: Diagnosis not present

## 2021-05-23 DIAGNOSIS — I129 Hypertensive chronic kidney disease with stage 1 through stage 4 chronic kidney disease, or unspecified chronic kidney disease: Secondary | ICD-10-CM | POA: Diagnosis not present

## 2021-05-23 DIAGNOSIS — G8929 Other chronic pain: Secondary | ICD-10-CM | POA: Diagnosis not present

## 2021-05-23 DIAGNOSIS — F0390 Unspecified dementia without behavioral disturbance: Secondary | ICD-10-CM | POA: Diagnosis not present

## 2021-05-23 DIAGNOSIS — E890 Postprocedural hypothyroidism: Secondary | ICD-10-CM | POA: Diagnosis not present

## 2021-05-23 DIAGNOSIS — F419 Anxiety disorder, unspecified: Secondary | ICD-10-CM | POA: Diagnosis not present

## 2021-05-23 DIAGNOSIS — I480 Paroxysmal atrial fibrillation: Secondary | ICD-10-CM | POA: Diagnosis not present

## 2021-05-24 ENCOUNTER — Other Ambulatory Visit: Payer: Self-pay | Admitting: *Deleted

## 2021-05-24 ENCOUNTER — Telehealth: Payer: Self-pay | Admitting: *Deleted

## 2021-05-24 ENCOUNTER — Telehealth: Payer: Medicare PPO

## 2021-05-24 DIAGNOSIS — M25561 Pain in right knee: Secondary | ICD-10-CM

## 2021-05-24 NOTE — Patient Outreach (Signed)
[facility]) Care Management  [facility] Social Work  05/24/2021  JAIDEE STIPE [birth year] [phone]  Subjective:  Depression and anxiety symptoms  Objective: Provide pt with resources, support and other as identified.  Encounter Medications:  Outpatient Encounter Medications as of 05/24/2021  Medication Sig Note   acetaminophen (TYLENOL) 500 MG tablet Take 500 mg by mouth daily as needed.    amLODipine (NORVASC) 10 MG tablet Take 10 mg by mouth daily.    apixaban (ELIQUIS) 2.5 MG TABS tablet Take 1 tablet (2.5 mg total) by mouth 2 (two) times daily. (Patient taking differently: Take 5 mg by mouth 2 (two) times daily.)    atorvastatin (LIPITOR) 40 MG tablet Take 1 tablet by mouth daily.    citalopram (CELEXA) 20 MG tablet Take 10 mg by mouth daily. 05/12/2021: Just started medication last week so is taking 1/2 (0.5) tablet to start then increasing as directed by md.   cyanocobalamin (,VITAMIN B-12,) 1000 MCG/ML injection Inject 1,000 mcg into the muscle See admin instructions. Inject 1054mcg once weekly for 4 weeks then recheck by Dr Wynetta Emery 05/12/2021: Last shot on 05-06-2021   fluticasone (FLONASE) 50 MCG/ACT nasal spray Place 1 spray into both nostrils daily. 05/12/2021: When congested   levothyroxine (SYNTHROID, LEVOTHROID) 50 MCG tablet Take 50 mcg by mouth daily before breakfast.    LORazepam (ATIVAN) 0.5 MG tablet Take 0.5 mg by mouth 3 (three) times daily as needed.    ondansetron (ZOFRAN ODT) 8 MG disintegrating tablet Take 1 tablet (8 mg total) by mouth every 8 (eight) hours as needed for nausea or vomiting.    pantoprazole (PROTONIX) 40 MG tablet Take 40 mg by mouth daily.    Polyethyl Glycol-Propyl Glycol (SYSTANE OP) Place 1 drop into both eyes 2 (two) times daily.    polyethylene glycol powder (GLYCOLAX/MIRALAX) 17 GM/SCOOP powder Take 17 g by mouth daily.    potassium chloride SA (K-DUR,KLOR-CON) 20 MEQ tablet Take 20 mEq by mouth every morning.    senna-docusate  (SENOKOT-S) 8.6-50 MG tablet Take 2 tablets by mouth 2 (two) times daily as needed for mild constipation.    spironolactone (ALDACTONE) 25 MG tablet Take 25 mg by mouth daily.    traZODone (DESYREL) 50 MG tablet Take 50 mg by mouth at bedtime.    No facility-administered encounter medications on file as of 05/24/2021.    Functional Status:  In your present state of health, do you have any difficulty performing the following activities: 05/13/2021 05/13/2021  Hearing? N N  Vision? N N  Difficulty concentrating or making decisions? N N  Walking or climbing stairs? - Y  Dressing or bathing? - N  Doing errands, shopping? Y -  Some recent data might be hidden    Fall/Depression Screening:  PHQ 2/9 Scores 05/24/2021  PHQ - 2 Score 2  PHQ- 9 Score 8    Assessment:   CSW spoke with pt by phone. Pt reports recent release from hospital (colitis). She is now home alone where she lives independently. Pt acknowledges dealing with both anxiety and depression for a while now; just recently having added RX (Celexa) to her regimen.  Pt has been on Celexa for about 3 weeks and is optimistic that it will help. CSW reminded pt that it can take a while for results to be seen.  CSW completed the PHQ9 Depression Screening with pt; pt scoring mild severity (8).  She is interested in pursuing some counseling and would like to initially do virtual because  she has not been driving the last few weeks due to knee pain ("bone on bone"). She is seeing an Orthopedic for this.  Pt denies any SI/HI.  Care Plan Pt agreeable to referral being placed to [facility] for linking with in-network counseling support. CSW will place this referral as well as to Care Guide for transportation resources ([place], [place])..    Goals Addressed   Begin counseling for depression/anxiety     Plan:  Follow-up:  Patient agrees to Care Plan and Follow-up. Follow-up in 3 week(s)   [name], MSW, [place] Worker   [place] [phone]

## 2021-05-28 ENCOUNTER — Telehealth: Payer: Self-pay | Admitting: *Deleted

## 2021-05-28 NOTE — Telephone Encounter (Signed)
   Telephone encounter was:  Unsuccessful.  05/28/2021 Name: Erin Sutton MRN: [id number] DOB: 1940/11/30  Unsuccessful outbound call made today to assist with:  Transportation Needs   Outreach Attempt:  1st Attempt  A HIPAA compliant voice message was left requesting a return call.  Instructed patient to call back at   Instructed patient to call back at [phone]  at their earliest convenience.   [facility], Care Management  [phone] [address] , [place] [address] Email : [email or web address]

## 2021-05-28 NOTE — Telephone Encounter (Signed)
   Telephone encounter was:  Successful.  05/28/2021 Name: [name] MRN: [id number] DOB: 11-Feb-1941  Erin Sutton is a 80 y.o. year old female who is a primary care patient of Baxter Hire, MD . The community resource team was consulted for assistance with Transportation Needs   Care guide performed the following interventions: Patient provided with information about care guide support team and interviewed to confirm resource needs.Patient returning call about transportation for doctors appt discussed Mcarthur Rossetti transportation and patient happy to use the transportation , also advised to reach out if she needs anyother information   Follow Up Plan:  No further follow up planned at this time. The patient has been provided with needed resources.  [place], Care Management  [phone] [address] , [place] [address] Email : [email or web address]

## 2021-05-29 DIAGNOSIS — F419 Anxiety disorder, unspecified: Secondary | ICD-10-CM | POA: Diagnosis not present

## 2021-05-29 DIAGNOSIS — Z09 Encounter for follow-up examination after completed treatment for conditions other than malignant neoplasm: Secondary | ICD-10-CM | POA: Diagnosis not present

## 2021-05-29 DIAGNOSIS — G4733 Obstructive sleep apnea (adult) (pediatric): Secondary | ICD-10-CM | POA: Diagnosis not present

## 2021-05-29 DIAGNOSIS — R112 Nausea with vomiting, unspecified: Secondary | ICD-10-CM | POA: Diagnosis not present

## 2021-05-29 DIAGNOSIS — E538 Deficiency of other specified B group vitamins: Secondary | ICD-10-CM | POA: Diagnosis not present

## 2021-05-29 DIAGNOSIS — F321 Major depressive disorder, single episode, moderate: Secondary | ICD-10-CM | POA: Diagnosis not present

## 2021-05-31 DIAGNOSIS — I5032 Chronic diastolic (congestive) heart failure: Secondary | ICD-10-CM | POA: Diagnosis not present

## 2021-05-31 DIAGNOSIS — I130 Hypertensive heart and chronic kidney disease with heart failure and stage 1 through stage 4 chronic kidney disease, or unspecified chronic kidney disease: Secondary | ICD-10-CM | POA: Diagnosis not present

## 2021-05-31 DIAGNOSIS — D630 Anemia in neoplastic disease: Secondary | ICD-10-CM | POA: Diagnosis not present

## 2021-05-31 DIAGNOSIS — F411 Generalized anxiety disorder: Secondary | ICD-10-CM | POA: Diagnosis not present

## 2021-05-31 DIAGNOSIS — F0390 Unspecified dementia without behavioral disturbance: Secondary | ICD-10-CM | POA: Diagnosis not present

## 2021-05-31 DIAGNOSIS — K529 Noninfective gastroenteritis and colitis, unspecified: Secondary | ICD-10-CM | POA: Diagnosis not present

## 2021-05-31 DIAGNOSIS — D259 Leiomyoma of uterus, unspecified: Secondary | ICD-10-CM | POA: Diagnosis not present

## 2021-05-31 DIAGNOSIS — D631 Anemia in chronic kidney disease: Secondary | ICD-10-CM | POA: Diagnosis not present

## 2021-05-31 DIAGNOSIS — N189 Chronic kidney disease, unspecified: Secondary | ICD-10-CM | POA: Diagnosis not present

## 2021-06-13 ENCOUNTER — Other Ambulatory Visit: Payer: Self-pay | Admitting: *Deleted

## 2021-06-13 ENCOUNTER — Telehealth: Payer: Self-pay | Admitting: *Deleted

## 2021-06-13 DIAGNOSIS — Z86718 Personal history of other venous thrombosis and embolism: Secondary | ICD-10-CM | POA: Diagnosis not present

## 2021-06-13 DIAGNOSIS — R400 Somnolence: Secondary | ICD-10-CM | POA: Diagnosis not present

## 2021-06-13 DIAGNOSIS — I4891 Unspecified atrial fibrillation: Secondary | ICD-10-CM | POA: Diagnosis not present

## 2021-06-13 DIAGNOSIS — I110 Hypertensive heart disease with heart failure: Secondary | ICD-10-CM | POA: Diagnosis not present

## 2021-06-13 DIAGNOSIS — I10 Essential (primary) hypertension: Secondary | ICD-10-CM | POA: Diagnosis not present

## 2021-06-13 DIAGNOSIS — R4182 Altered mental status, unspecified: Secondary | ICD-10-CM | POA: Diagnosis not present

## 2021-06-13 DIAGNOSIS — R4701 Aphasia: Secondary | ICD-10-CM | POA: Diagnosis not present

## 2021-06-13 DIAGNOSIS — Z8673 Personal history of transient ischemic attack (TIA), and cerebral infarction without residual deficits: Secondary | ICD-10-CM | POA: Diagnosis not present

## 2021-06-13 DIAGNOSIS — Z86711 Personal history of pulmonary embolism: Secondary | ICD-10-CM | POA: Diagnosis not present

## 2021-06-13 DIAGNOSIS — I480 Paroxysmal atrial fibrillation: Secondary | ICD-10-CM | POA: Diagnosis not present

## 2021-06-13 DIAGNOSIS — I6523 Occlusion and stenosis of bilateral carotid arteries: Secondary | ICD-10-CM | POA: Diagnosis not present

## 2021-06-13 DIAGNOSIS — Z7901 Long term (current) use of anticoagulants: Secondary | ICD-10-CM | POA: Diagnosis not present

## 2021-06-13 DIAGNOSIS — R29818 Other symptoms and signs involving the nervous system: Secondary | ICD-10-CM | POA: Diagnosis not present

## 2021-06-13 DIAGNOSIS — R55 Syncope and collapse: Secondary | ICD-10-CM | POA: Diagnosis not present

## 2021-06-13 DIAGNOSIS — I5032 Chronic diastolic (congestive) heart failure: Secondary | ICD-10-CM | POA: Diagnosis not present

## 2021-06-13 DIAGNOSIS — U071 COVID-19: Secondary | ICD-10-CM | POA: Diagnosis not present

## 2021-06-13 DIAGNOSIS — I6602 Occlusion and stenosis of left middle cerebral artery: Secondary | ICD-10-CM | POA: Diagnosis not present

## 2021-06-13 DIAGNOSIS — R29898 Other symptoms and signs involving the musculoskeletal system: Secondary | ICD-10-CM | POA: Diagnosis not present

## 2021-06-13 DIAGNOSIS — R079 Chest pain, unspecified: Secondary | ICD-10-CM | POA: Diagnosis not present

## 2021-06-13 NOTE — Telephone Encounter (Cosign Needed)
  Care Management   Follow Up Note   06/13/2021 Name: Erin Sutton MRN: [id number] DOB: 25-Jan-1941   Referred by: Baxter Hire, MD Reason for referral : No chief complaint on file.   A second unsuccessful telephone outreach was attempted today. The patient was referred to the case management team for assistance with care management and care coordination.   Follow Up Plan: Telephone follow up appointment with care management team member scheduled for:07/09/21  Kyuss Hale MSW, LCSW Licensed Clinical Social Education officer, environmental Primary Care [phone]

## 2021-06-14 DIAGNOSIS — I4891 Unspecified atrial fibrillation: Secondary | ICD-10-CM | POA: Diagnosis not present

## 2021-06-14 DIAGNOSIS — I480 Paroxysmal atrial fibrillation: Secondary | ICD-10-CM | POA: Diagnosis not present

## 2021-06-14 DIAGNOSIS — R55 Syncope and collapse: Secondary | ICD-10-CM | POA: Diagnosis not present

## 2021-06-14 DIAGNOSIS — I6602 Occlusion and stenosis of left middle cerebral artery: Secondary | ICD-10-CM | POA: Diagnosis not present

## 2021-06-14 DIAGNOSIS — R29818 Other symptoms and signs involving the nervous system: Secondary | ICD-10-CM | POA: Diagnosis not present

## 2021-06-14 DIAGNOSIS — R4701 Aphasia: Secondary | ICD-10-CM | POA: Diagnosis not present

## 2021-06-14 DIAGNOSIS — R4182 Altered mental status, unspecified: Secondary | ICD-10-CM | POA: Diagnosis not present

## 2021-06-14 DIAGNOSIS — I6523 Occlusion and stenosis of bilateral carotid arteries: Secondary | ICD-10-CM | POA: Diagnosis not present

## 2021-06-15 DIAGNOSIS — I4891 Unspecified atrial fibrillation: Secondary | ICD-10-CM | POA: Diagnosis not present

## 2021-06-15 DIAGNOSIS — U071 COVID-19: Secondary | ICD-10-CM | POA: Diagnosis not present

## 2021-06-16 DIAGNOSIS — U071 COVID-19: Secondary | ICD-10-CM | POA: Diagnosis not present

## 2021-06-16 DIAGNOSIS — I4891 Unspecified atrial fibrillation: Secondary | ICD-10-CM | POA: Diagnosis not present

## 2021-06-17 DIAGNOSIS — I4891 Unspecified atrial fibrillation: Secondary | ICD-10-CM | POA: Diagnosis not present

## 2021-06-17 DIAGNOSIS — U071 COVID-19: Secondary | ICD-10-CM | POA: Diagnosis not present

## 2021-06-24 DIAGNOSIS — D259 Leiomyoma of uterus, unspecified: Secondary | ICD-10-CM | POA: Diagnosis not present

## 2021-06-24 DIAGNOSIS — D631 Anemia in chronic kidney disease: Secondary | ICD-10-CM | POA: Diagnosis not present

## 2021-06-24 DIAGNOSIS — F411 Generalized anxiety disorder: Secondary | ICD-10-CM | POA: Diagnosis not present

## 2021-06-24 DIAGNOSIS — I10 Essential (primary) hypertension: Secondary | ICD-10-CM | POA: Diagnosis not present

## 2021-06-24 DIAGNOSIS — N189 Chronic kidney disease, unspecified: Secondary | ICD-10-CM | POA: Diagnosis not present

## 2021-06-24 DIAGNOSIS — U071 COVID-19: Secondary | ICD-10-CM | POA: Diagnosis not present

## 2021-06-24 DIAGNOSIS — I5032 Chronic diastolic (congestive) heart failure: Secondary | ICD-10-CM | POA: Diagnosis not present

## 2021-06-24 DIAGNOSIS — I130 Hypertensive heart and chronic kidney disease with heart failure and stage 1 through stage 4 chronic kidney disease, or unspecified chronic kidney disease: Secondary | ICD-10-CM | POA: Diagnosis not present

## 2021-06-24 DIAGNOSIS — Z09 Encounter for follow-up examination after completed treatment for conditions other than malignant neoplasm: Secondary | ICD-10-CM | POA: Diagnosis not present

## 2021-06-24 DIAGNOSIS — F0390 Unspecified dementia without behavioral disturbance: Secondary | ICD-10-CM | POA: Diagnosis not present

## 2021-06-24 DIAGNOSIS — D630 Anemia in neoplastic disease: Secondary | ICD-10-CM | POA: Diagnosis not present

## 2021-06-24 DIAGNOSIS — K529 Noninfective gastroenteritis and colitis, unspecified: Secondary | ICD-10-CM | POA: Diagnosis not present

## 2021-06-27 DIAGNOSIS — D631 Anemia in chronic kidney disease: Secondary | ICD-10-CM | POA: Diagnosis not present

## 2021-06-27 DIAGNOSIS — N189 Chronic kidney disease, unspecified: Secondary | ICD-10-CM | POA: Diagnosis not present

## 2021-06-27 DIAGNOSIS — F0390 Unspecified dementia without behavioral disturbance: Secondary | ICD-10-CM | POA: Diagnosis not present

## 2021-06-27 DIAGNOSIS — D630 Anemia in neoplastic disease: Secondary | ICD-10-CM | POA: Diagnosis not present

## 2021-06-27 DIAGNOSIS — I5032 Chronic diastolic (congestive) heart failure: Secondary | ICD-10-CM | POA: Diagnosis not present

## 2021-06-27 DIAGNOSIS — D259 Leiomyoma of uterus, unspecified: Secondary | ICD-10-CM | POA: Diagnosis not present

## 2021-06-27 DIAGNOSIS — K529 Noninfective gastroenteritis and colitis, unspecified: Secondary | ICD-10-CM | POA: Diagnosis not present

## 2021-06-27 DIAGNOSIS — F411 Generalized anxiety disorder: Secondary | ICD-10-CM | POA: Diagnosis not present

## 2021-06-27 DIAGNOSIS — I130 Hypertensive heart and chronic kidney disease with heart failure and stage 1 through stage 4 chronic kidney disease, or unspecified chronic kidney disease: Secondary | ICD-10-CM | POA: Diagnosis not present

## 2021-06-28 DIAGNOSIS — I5032 Chronic diastolic (congestive) heart failure: Secondary | ICD-10-CM | POA: Diagnosis not present

## 2021-06-28 DIAGNOSIS — D631 Anemia in chronic kidney disease: Secondary | ICD-10-CM | POA: Diagnosis not present

## 2021-06-28 DIAGNOSIS — F411 Generalized anxiety disorder: Secondary | ICD-10-CM | POA: Diagnosis not present

## 2021-06-28 DIAGNOSIS — N189 Chronic kidney disease, unspecified: Secondary | ICD-10-CM | POA: Diagnosis not present

## 2021-06-28 DIAGNOSIS — I130 Hypertensive heart and chronic kidney disease with heart failure and stage 1 through stage 4 chronic kidney disease, or unspecified chronic kidney disease: Secondary | ICD-10-CM | POA: Diagnosis not present

## 2021-06-28 DIAGNOSIS — K529 Noninfective gastroenteritis and colitis, unspecified: Secondary | ICD-10-CM | POA: Diagnosis not present

## 2021-06-28 DIAGNOSIS — D259 Leiomyoma of uterus, unspecified: Secondary | ICD-10-CM | POA: Diagnosis not present

## 2021-06-29 ENCOUNTER — Emergency Department: Payer: Medicare HMO

## 2021-06-29 ENCOUNTER — Encounter: Payer: Self-pay | Admitting: Emergency Medicine

## 2021-06-29 ENCOUNTER — Observation Stay
Admission: EM | Admit: 2021-06-29 | Discharge: 2021-07-04 | Disposition: A | Discharge status: Home / Self Care | Payer: Medicare HMO | Attending: Internal Medicine | Admitting: Internal Medicine

## 2021-06-29 ENCOUNTER — Other Ambulatory Visit: Payer: Self-pay

## 2021-06-29 DIAGNOSIS — E039 Hypothyroidism, unspecified: Secondary | ICD-10-CM | POA: Diagnosis not present

## 2021-06-29 DIAGNOSIS — U071 COVID-19: Secondary | ICD-10-CM | POA: Insufficient documentation

## 2021-06-29 DIAGNOSIS — I129 Hypertensive chronic kidney disease with stage 1 through stage 4 chronic kidney disease, or unspecified chronic kidney disease: Secondary | ICD-10-CM | POA: Insufficient documentation

## 2021-06-29 DIAGNOSIS — R404 Transient alteration of awareness: Secondary | ICD-10-CM | POA: Diagnosis not present

## 2021-06-29 DIAGNOSIS — I10 Essential (primary) hypertension: Secondary | ICD-10-CM | POA: Diagnosis not present

## 2021-06-29 DIAGNOSIS — Z7901 Long term (current) use of anticoagulants: Secondary | ICD-10-CM | POA: Diagnosis not present

## 2021-06-29 DIAGNOSIS — R4182 Altered mental status, unspecified: Principal | ICD-10-CM | POA: Diagnosis present

## 2021-06-29 DIAGNOSIS — Z96652 Presence of left artificial knee joint: Secondary | ICD-10-CM | POA: Diagnosis not present

## 2021-06-29 DIAGNOSIS — J90 Pleural effusion, not elsewhere classified: Secondary | ICD-10-CM | POA: Diagnosis not present

## 2021-06-29 DIAGNOSIS — Z79899 Other long term (current) drug therapy: Secondary | ICD-10-CM | POA: Insufficient documentation

## 2021-06-29 DIAGNOSIS — F0390 Unspecified dementia without behavioral disturbance: Secondary | ICD-10-CM | POA: Insufficient documentation

## 2021-06-29 DIAGNOSIS — R059 Cough, unspecified: Secondary | ICD-10-CM | POA: Diagnosis not present

## 2021-06-29 DIAGNOSIS — N184 Chronic kidney disease, stage 4 (severe): Secondary | ICD-10-CM | POA: Diagnosis not present

## 2021-06-29 LAB — URINALYSIS, COMPLETE (UACMP) WITH MICROSCOPIC
Bacteria, UA: NONE SEEN
Bilirubin Urine: NEGATIVE
Glucose, UA: NEGATIVE mg/dL
Ketones, ur: 5 mg/dL — AB
Leukocytes,Ua: NEGATIVE
Nitrite: NEGATIVE
Protein, ur: NEGATIVE mg/dL
Specific Gravity, Urine: 1.005 (ref 1.005–1.030)
pH: 9 — ABNORMAL HIGH (ref 5.0–8.0)

## 2021-06-29 LAB — RESP PANEL BY RT-PCR (FLU A&B, COVID) ARPGX2
Influenza A by PCR: NEGATIVE
Influenza B by PCR: NEGATIVE
SARS Coronavirus 2 by RT PCR: POSITIVE — AB

## 2021-06-29 LAB — COMPREHENSIVE METABOLIC PANEL
ALT: 15 U/L (ref 0–44)
AST: 29 U/L (ref 15–41)
Albumin: 4.1 g/dL (ref 3.5–5.0)
Alkaline Phosphatase: 66 U/L (ref 38–126)
Anion gap: 12 (ref 5–15)
BUN: 9 mg/dL (ref 8–23)
CO2: 23 mmol/L (ref 22–32)
Calcium: 9.7 mg/dL (ref 8.9–10.3)
Chloride: 102 mmol/L (ref 98–111)
Creatinine, Ser: 0.82 mg/dL (ref 0.44–1.00)
GFR, Estimated: >60 mL/min (ref >60)
Glucose, Bld: 106 mg/dL — ABNORMAL HIGH (ref 70–99)
Potassium: 3.6 mmol/L (ref 3.5–5.1)
Sodium: 137 mmol/L (ref 135–145)
Total Bilirubin: 1.3 mg/dL — ABNORMAL HIGH (ref 0.3–1.2)
Total Protein: 7.6 g/dL (ref 6.5–8.1)

## 2021-06-29 LAB — CBC WITH DIFFERENTIAL/PLATELET
Abs Immature Granulocytes: 0.02 10*3/uL (ref 0.00–0.07)
Basophils Absolute: 0 10*3/uL (ref 0.0–0.1)
Basophils Relative: 1 %
Eosinophils Absolute: 0 10*3/uL (ref 0.0–0.5)
Eosinophils Relative: 1 %
HCT: 41.5 % (ref 36.0–46.0)
Hemoglobin: 13.8 g/dL (ref 12.0–15.0)
Immature Granulocytes: 0 %
Lymphocytes Relative: 17 %
Lymphs Abs: 1 10*3/uL (ref 0.7–4.0)
MCH: 30.8 pg (ref 26.0–34.0)
MCHC: 33.3 g/dL (ref 30.0–36.0)
MCV: 92.6 fL (ref 80.0–100.0)
Monocytes Absolute: 0.5 10*3/uL (ref 0.1–1.0)
Monocytes Relative: 9 %
Neutro Abs: 4.5 10*3/uL (ref 1.7–7.7)
Neutrophils Relative %: 72 %
Platelets: 236 10*3/uL (ref 150–400)
RBC: 4.48 MIL/uL (ref 3.87–5.11)
RDW: 15.1 % (ref 11.5–15.5)
WBC: 6.1 10*3/uL (ref 4.0–10.5)
nRBC: 0 % (ref 0.0–0.2)

## 2021-06-29 LAB — PROTIME-INR
INR: 1.5 — ABNORMAL HIGH (ref 0.8–1.2)
Prothrombin Time: 17.7 seconds — ABNORMAL HIGH (ref 11.4–15.2)

## 2021-06-29 LAB — CBG MONITORING, ED: Glucose-Capillary: 103 mg/dL — ABNORMAL HIGH (ref 70–99)

## 2021-06-29 LAB — BLOOD GAS, ARTERIAL
Acid-Base Excess: 0.1 mmol/L (ref 0.0–2.0)
Bicarbonate: 23.8 mmol/L (ref 20.0–28.0)
FIO2: 0.21
O2 Saturation: 98.3 %
Patient temperature: 37
pCO2 arterial: 35 mmHg (ref 32.0–48.0)
pH, Arterial: 7.44 (ref 7.350–7.450)
pO2, Arterial: 106 mmHg (ref 83.0–108.0)

## 2021-06-29 LAB — BRAIN NATRIURETIC PEPTIDE: B Natriuretic Peptide: 24.2 pg/mL (ref 0.0–100.0)

## 2021-06-29 LAB — PROCALCITONIN: Procalcitonin: <0.1 ng/mL

## 2021-06-29 LAB — D-DIMER, QUANTITATIVE: D-Dimer, Quant: <0.27 ug/mL-FEU (ref 0.00–0.50)

## 2021-06-29 LAB — C-REACTIVE PROTEIN: CRP: 0.5 mg/dL (ref <1.0)

## 2021-06-29 LAB — FIBRINOGEN: Fibrinogen: 506 mg/dL — ABNORMAL HIGH (ref 210–475)

## 2021-06-29 LAB — TSH: TSH: 1.297 u[IU]/mL (ref 0.350–4.500)

## 2021-06-29 LAB — TROPONIN I (HIGH SENSITIVITY)
Troponin I (High Sensitivity): 9 ng/L (ref <18)
Troponin I (High Sensitivity): 10 ng/L (ref <18)

## 2021-06-29 LAB — CK: Total CK: 40 U/L (ref 38–234)

## 2021-06-29 LAB — FERRITIN: Ferritin: 16 ng/mL (ref 11–307)

## 2021-06-29 MED ORDER — LORAZEPAM 0.5 MG PO TABS
0.5000 mg | ORAL_TABLET | Freq: Three times a day (TID) | ORAL | Status: DC | PRN
  Administered 2021-06-30 – 2021-07-02 (×2): 0.5 mg via ORAL
  Filled 2021-06-29 (×2): qty 1

## 2021-06-29 MED ORDER — POLYVINYL ALCOHOL 1.4 % OP SOLN
Freq: Two times a day (BID) | OPHTHALMIC | Status: DC
  Administered 2021-07-03: 1 [drp] via OPHTHALMIC
  Filled 2021-06-29: qty 15

## 2021-06-29 MED ORDER — ADULT MULTIVITAMIN W/MINERALS CH
1.0000 | ORAL_TABLET | Freq: Every day | ORAL | Status: DC
  Administered 2021-06-29 – 2021-07-04 (×6): 1 via ORAL
  Filled 2021-06-29 (×6): qty 1

## 2021-06-29 MED ORDER — ZINC SULFATE 220 (50 ZN) MG PO CAPS
220.0000 mg | ORAL_CAPSULE | Freq: Every day | ORAL | Status: DC
  Administered 2021-06-29 – 2021-07-04 (×6): 220 mg via ORAL
  Filled 2021-06-29 (×7): qty 1

## 2021-06-29 MED ORDER — LEVOTHYROXINE SODIUM 50 MCG PO TABS
50.0000 ug | ORAL_TABLET | Freq: Every day | ORAL | Status: DC
  Administered 2021-06-30 – 2021-07-04 (×5): 50 ug via ORAL
  Filled 2021-06-29 (×5): qty 1

## 2021-06-29 MED ORDER — SODIUM CHLORIDE 0.9 % IV BOLUS
1000.0000 mL | Freq: Once | INTRAVENOUS | Status: AC
  Administered 2021-06-29: 1000 mL via INTRAVENOUS

## 2021-06-29 MED ORDER — LACTATED RINGERS IV SOLN: INTRAVENOUS | Status: AC

## 2021-06-29 MED ORDER — POLYETHYLENE GLYCOL 3350 17 G PO PACK
17.0000 g | PACK | Freq: Every day | ORAL | Status: DC | PRN
  Administered 2021-07-03: 16:00:00 17 g via ORAL
  Filled 2021-06-29 (×3): qty 1

## 2021-06-29 MED ORDER — APIXABAN 5 MG PO TABS
5.0000 mg | ORAL_TABLET | Freq: Two times a day (BID) | ORAL | Status: DC
  Administered 2021-06-29 – 2021-07-04 (×11): 5 mg via ORAL
  Filled 2021-06-29 (×11): qty 1

## 2021-06-29 MED ORDER — HYDROCOD POLST-CPM POLST ER 10-8 MG/5ML PO SUER
5.0000 mL | Freq: Two times a day (BID) | ORAL | Status: DC | PRN
Start: 2021-06-29 — End: 2021-06-29

## 2021-06-29 MED ORDER — CITALOPRAM HYDROBROMIDE 20 MG PO TABS
10.0000 mg | ORAL_TABLET | Freq: Every day | ORAL | Status: DC
  Administered 2021-06-29: 10 mg via ORAL
  Filled 2021-06-29 (×2): qty 1

## 2021-06-29 MED ORDER — ONDANSETRON HCL 4 MG/2ML IJ SOLN
4.0000 mg | Freq: Four times a day (QID) | INTRAMUSCULAR | Status: DC | PRN
  Administered 2021-07-01: 09:00:00 4 mg via INTRAVENOUS
  Filled 2021-06-29: qty 2

## 2021-06-29 MED ORDER — ATORVASTATIN CALCIUM 20 MG PO TABS
40.0000 mg | ORAL_TABLET | Freq: Every day | ORAL | Status: DC
  Administered 2021-06-29 – 2021-07-04 (×6): 40 mg via ORAL
  Filled 2021-06-29 (×6): qty 2

## 2021-06-29 MED ORDER — SODIUM CHLORIDE 0.9 % IV SOLN: 100.0000 mg | Freq: Every day | INTRAVENOUS | Status: DC

## 2021-06-29 MED ORDER — SODIUM CHLORIDE 0.9 % IV SOLN
200.0000 mg | Freq: Once | INTRAVENOUS | Status: DC
  Filled 2021-06-29: qty 40

## 2021-06-29 MED ORDER — GUAIFENESIN-DM 100-10 MG/5ML PO SYRP: 10.0000 mL | ORAL_SOLUTION | ORAL | Status: DC | PRN

## 2021-06-29 MED ORDER — ACETAMINOPHEN 325 MG PO TABS
650.0000 mg | ORAL_TABLET | Freq: Four times a day (QID) | ORAL | Status: DC | PRN
  Administered 2021-06-29 – 2021-06-30 (×2): 650 mg via ORAL
  Filled 2021-06-29 (×2): qty 2

## 2021-06-29 MED ORDER — SPIRONOLACTONE 25 MG PO TABS
25.0000 mg | ORAL_TABLET | Freq: Every day | ORAL | Status: DC
  Administered 2021-06-30 – 2021-07-04 (×5): 25 mg via ORAL
  Filled 2021-06-29 (×5): qty 1

## 2021-06-29 MED ORDER — AMLODIPINE BESYLATE 10 MG PO TABS
10.0000 mg | ORAL_TABLET | Freq: Every day | ORAL | Status: DC
  Administered 2021-06-29 – 2021-07-04 (×6): 10 mg via ORAL
  Filled 2021-06-29 (×5): qty 1
  Filled 2021-06-29: qty 2

## 2021-06-29 MED ORDER — ONDANSETRON HCL 4 MG PO TABS: 4.0000 mg | ORAL_TABLET | Freq: Four times a day (QID) | ORAL | Status: DC | PRN

## 2021-06-29 MED ORDER — ASCORBIC ACID 500 MG PO TABS
500.0000 mg | ORAL_TABLET | Freq: Every day | ORAL | Status: DC
  Administered 2021-06-29 – 2021-07-04 (×6): 500 mg via ORAL
  Filled 2021-06-29 (×6): qty 1

## 2021-06-29 MED ORDER — PANTOPRAZOLE SODIUM 40 MG PO TBEC
40.0000 mg | DELAYED_RELEASE_TABLET | Freq: Every day | ORAL | Status: DC
  Administered 2021-06-30 – 2021-07-04 (×5): 40 mg via ORAL
  Filled 2021-06-29 (×5): qty 1

## 2021-06-29 NOTE — ED Notes (Signed)
Patient transported to CT 

## 2021-06-29 NOTE — Progress Notes (Signed)
Remdesivir - Pharmacy Brief Note   O:  ALT: 15 CXR: Mild bronchitic changes SpO2: 99% on RA    A/P:  Remdesivir 200 mg IVPB once followed by 100 mg IVPB daily x 4 days.   Pernell Dupre, PharmD, BCPS Clinical Pharmacist 06/29/2021 11:58 AM

## 2021-06-29 NOTE — H&P (Addendum)
History and Physical    Erin Sutton:[id number] DOB: 31-May-1941 DOA: 06/29/2021  PCP: Baxter Hire, MD   Patient coming from: Home  I have personally briefly reviewed patient's old medical records in [place]  Chief Complaint: Altered mental status  HPI: Erin Sutton is a 80 y.o. female with medical history significant of  anxiety, depression, hypertension, hyperlipidemia, atrial fibrillation, history of left lower extremity DVT, history of bilateral pulmonary embolism, GERD, history of major depressive disorder, hypothyroid, mild to moderate dementia who presents with altered mental status.  Per EMS, the patient lives alone but her neighbors check on her.  Yesterday she was seen to be at her baseline walking and talking.  Today when her neighbors went to check on her she was in her bed significantly altered. Initially patient seems very altered and unable to answer questions appropriately.  During the time of my interview she was more alert, oriented x3.  She told me that she was feeling very weak and achy since yesterday.  She was complaining of generalized body aches and headaches and she was having some chills.  Denies any nausea, vomiting or diarrhea.  She also told me that she was admitted for 5 days in an hospital at [facility] when she was visiting her daughter and tested positive for COVID-19 at that time.  She was completed the 5-day course of remdesivir.  Which was verified by after talking with her daughter on phone too.  Patient was having some decreased appetite and p.o. intake since yesterday.  Denies any urinary symptoms.  Denies any specific pain except generalized body aches and knee pains which is chronic as she has arthritis and need to have her knee replaced.  Patient denies any chest pain, upper respiratory symptoms, shortness of breath.  No abdominal pain.  Denies any recent sick contacts.  She has been vaccinated for COVID-19 including 1  booster.  At baseline patient lives alone and independent for her ADLs.  ED Course: Hemodynamically stable,Covid 19 PCR positive with CT value of 38.5 which makes it a prior infection.  UA with mild ketonuria and not impressive for any infection.  CBC unremarkable.  CMP with T bili of 1.3, no other significant abnormality. CT head and chest x-ray was without any acute abnormality. Patient received 1 L of NS bolus.  Review of Systems: As per HPI otherwise 10 point review of systems negative.    Past Medical History:  Diagnosis Date   Anxiety    Arthritis    Chronic kidney disease    Constipation    GERD (gastroesophageal reflux disease)    Hypertension    Hypothyroidism    Osteoporosis    Sleep apnea    Urinary incontinence     Past Surgical History:  Procedure Laterality Date   BREAST BIOPSY Right    neg/stereo   BREAST BIOPSY Left    neg/stereo   BREAST EXCISIONAL BIOPSY Right    neg   CATARACT EXTRACTION, BILATERAL     COLONOSCOPY WITH PROPOFOL N/A 07/02/2015   Procedure: COLONOSCOPY WITH PROPOFOL;  Surgeon: Manya Silvas, MD;  Location: [facility];  Service: Endoscopy;  Laterality: N/A;   CORRECTION HAMMER TOE Right    ESOPHAGOGASTRODUODENOSCOPY (EGD) WITH PROPOFOL N/A 07/02/2015   Procedure: ESOPHAGOGASTRODUODENOSCOPY (EGD) WITH PROPOFOL;  Surgeon: Manya Silvas, MD;  Location: [facility] ENDOSCOPY;  Service: Endoscopy;  Laterality: N/A;   JOINT REPLACEMENT Left    knee   REPLACEMENT TOTAL KNEE Left  SKIN BIOPSY Right      reports that she has never smoked. She has never used smokeless tobacco. She reports that she does not drink alcohol and does not use drugs.  Allergies  Allergen Reactions   Ace Inhibitors Cough   Levofloxacin    Penicillin G Other (See Comments)   Tape Other (See Comments)    Other reaction(s): Other (See Comments)   Amoxicillin Rash    Has patient had a PCN reaction causing immediate rash, facial/tongue/throat swelling, SOB or  lightheadedness with hypotension: No Has patient had a PCN reaction causing severe rash involving mucus membranes or skin necrosis: No Has patient had a PCN reaction that required hospitalization: No Has patient had a PCN reaction occurring within the last 10 years: No If all of the above answers are "NO", then may proceed with Cephalosporin use.    Benicar [Olmesartan] Rash    Itching rash   Codeine Rash   Codeine Sulfate Rash   Levaquin [Levofloxacin In D5w] Rash    Alters mental status   Penicillin V Potassium Rash    Has patient had a PCN reaction causing immediate rash, facial/tongue/throat swelling, SOB or lightheadedness with hypotension: No Has patient had a PCN reaction causing severe rash involving mucus membranes or skin necrosis: No Has patient had a PCN reaction that required hospitalization: No Has patient had a PCN reaction occurring within the last 10 years: No If all of the above answers are "NO", then may proceed with Cephalosporin use.    Sulfa Antibiotics Rash    Family History  Problem Relation Age of Onset   Hypertension Mother    Diabetes Mother    Heart attack Mother    Asthma Mother    Peripheral vascular disease Mother    Heart attack Father    Hypertension Father    Diabetes Father    Colon cancer Maternal Aunt    Breast cancer Cousin     Prior to Admission medications   Medication Sig Start Date End Date Taking? Authorizing Provider  acetaminophen (TYLENOL) 500 MG tablet Take 500 mg by mouth daily as needed.   Yes [provider]  amLODipine (NORVASC) 10 MG tablet Take 10 mg by mouth daily.   Yes [provider]  apixaban (ELIQUIS) 5 MG TABS tablet Take 5 mg by mouth in the morning and at bedtime.   Yes [provider]  atorvastatin (LIPITOR) 40 MG tablet Take 1 tablet by mouth daily. 02/15/21  Yes [provider]  levothyroxine (SYNTHROID, LEVOTHROID) 50 MCG tablet Take 50 mcg by mouth daily before breakfast.    Yes [provider]  LORazepam (ATIVAN) 0.5 MG tablet Take 0.5 mg by mouth 3 (three) times daily as needed. 02/01/21  Yes [provider]  ondansetron (ZOFRAN ODT) 8 MG disintegrating tablet Take 1 tablet (8 mg total) by mouth every 8 (eight) hours as needed for nausea or vomiting. 05/20/21  Yes Alma Friendly, MD  pantoprazole (PROTONIX) 40 MG tablet Take 40 mg by mouth daily.   Yes [provider]  Polyethyl Glycol-Propyl Glycol (SYSTANE OP) Place 1 drop into both eyes 2 (two) times daily.   Yes [provider]  polyethylene glycol powder (GLYCOLAX/MIRALAX) 17 GM/SCOOP powder Take 17 g by mouth daily. 08/03/20  Yes [provider]  senna-docusate (SENOKOT-S) 8.6-50 MG tablet Take 2 tablets by mouth 2 (two) times daily as needed for mild constipation. 04/15/20  Yes Sharen Hones, MD  spironolactone (ALDACTONE) 25 MG tablet  Take 25 mg by mouth daily. 04/04/21  Yes [provider]  traZODone (DESYREL) 50 MG tablet Take 50 mg by mouth at bedtime. 05/09/21  Yes [provider]  apixaban (ELIQUIS) 2.5 MG TABS tablet Take 1 tablet (2.5 mg total) by mouth 2 (two) times daily. Patient not taking: No sig reported 02/02/20   Earlie Server, MD  citalopram (CELEXA) 20 MG tablet Take 10 mg by mouth daily. 04/29/21   [provider]  cyanocobalamin (,VITAMIN B-12,) 1000 MCG/ML injection Inject 1,000 mcg into the muscle See admin instructions. Inject 1067mcg once weekly for 4 weeks then recheck by Dr Wynetta Emery 05/06/21   [provider]  fluticasone (FLONASE) 50 MCG/ACT nasal spray Place 1 spray into both nostrils daily.    [provider]  potassium chloride SA (K-DUR,KLOR-CON) 20 MEQ tablet Take 20 mEq by mouth every morning. Patient not taking: Reported on 06/29/2021    [provider]    Physical Exam: Vitals:   06/29/21 1022 06/29/21 1023 06/29/21 1100 06/29/21 1130  BP: (!) 186/81  (!) 158/82 (!) 168/89  Pulse: 87  81  80  Resp: 18  10 14   Temp: 98.1 F (36.7 C)     TempSrc: Oral     SpO2: 100%  90% 99%  Weight:  74.8 kg    Height:  5' (1.524 m)      General: Vital signs reviewed.  Patient is well-developed and well-nourished, in no acute distress and cooperative with exam.  Head: Normocephalic and atraumatic. Eyes: EOMI, conjunctivae normal, no scleral icterus.  ENMT: Mucous membranes are Dry. Neck: Supple, trachea midline,  Cardiovascular: RRR, S1 normal, S2 normal,  Pulmonary/Chest: Clear to auscultation bilaterally, no wheezes, rales, or rhonchi. Abdominal: Soft, non-tender, non-distended, BS +,  Extremities: No lower extremity edema bilaterally,  pulses symmetric and intact bilaterally. No cyanosis or clubbing. Neurological: A&O x3, Strength is normal and symmetric bilaterally, cranial nerve II-XII are grossly intact, no focal motor deficit, sensory intact to light touch bilaterally.  Psychiatric: Normal mood and affect.   Labs on Admission: I have personally reviewed following labs and imaging studies  CBC: Recent Labs  Lab 06/29/21 1003  WBC 6.1  NEUTROABS 4.5  HGB 13.8  HCT 41.5  MCV 92.6  PLT 169   Basic Metabolic Panel: Recent Labs  Lab 06/29/21 1003  NA 137  K 3.6  CL 102  CO2 23  GLUCOSE 106*  BUN 9  CREATININE 0.82  CALCIUM 9.7   GFR: Estimated Creatinine Clearance: 50.2 mL/min (by C-G formula based on SCr of 0.82 mg/dL). Liver Function Tests: Recent Labs  Lab 06/29/21 1003  AST 29  ALT 15  ALKPHOS 66  BILITOT 1.3*  PROT 7.6  ALBUMIN 4.1   No results for input(s): LIPASE, AMYLASE in the last 168 hours. No results for input(s): AMMONIA in the last 168 hours. Coagulation Profile: Recent Labs  Lab 06/29/21 1003  INR 1.5*   Cardiac Enzymes: Recent Labs  Lab 06/29/21 1003  CKTOTAL 40   BNP (last 3 results) No results for input(s): PROBNP in the last 8760 hours. HbA1C: No results for input(s): HGBA1C in the last 72 hours. CBG: Recent Labs  Lab  06/29/21 1051  GLUCAP 103*   Lipid Profile: No results for input(s): CHOL, HDL, LDLCALC, TRIG, CHOLHDL, LDLDIRECT in the last 72 hours. Thyroid Function Tests: Recent Labs    06/29/21 1003  TSH 1.297   Anemia Panel: No results for input(s): VITAMINB12, FOLATE, FERRITIN, TIBC, IRON, RETICCTPCT  in the last 72 hours. Urine analysis:    Component Value Date/Time   COLORURINE COLORLESS (A) 06/29/2021 1003   APPEARANCEUR CLEAR (A) 06/29/2021 1003   APPEARANCEUR Hazy 07/25/2014 1350   LABSPEC 1.005 06/29/2021 1003   LABSPEC 1.020 07/25/2014 1350   PHURINE 9.0 (H) 06/29/2021 1003   GLUCOSEU NEGATIVE 06/29/2021 1003   GLUCOSEU Negative 07/25/2014 1350   HGBUR SMALL (A) 06/29/2021 1003   BILIRUBINUR NEGATIVE 06/29/2021 1003   BILIRUBINUR Negative 07/25/2014 1350   KETONESUR 5 (A) 06/29/2021 1003   PROTEINUR NEGATIVE 06/29/2021 1003   NITRITE NEGATIVE 06/29/2021 1003   LEUKOCYTESUR NEGATIVE 06/29/2021 1003   LEUKOCYTESUR 2+ 07/25/2014 1350    Radiological Exams on Admission: CT HEAD WO CONTRAST (5MM)  Result Date: 06/29/2021 CLINICAL DATA:  80 year old female with altered mental status. EXAM: CT HEAD WITHOUT CONTRAST TECHNIQUE: Contiguous axial images were obtained from the base of the skull through the vertex without intravenous contrast. COMPARISON:  05/12/2021 CT and prior studies FINDINGS: Brain: No evidence of acute infarction, hemorrhage, hydrocephalus, extra-axial collection or mass lesion/mass effect. Mild chronic small-vessel white matter ischemic changes. 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 Vascular: Carotid atherosclerotic calcifications are noted. Skull: Normal. Negative for fracture or focal lesion. Sinuses/Orbits: No acute finding. Other: None. IMPRESSION: 1. No evidence of acute intracranial abnormality. 2. 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  Electronically Signed   By: Margarette Canada M.D.   On: 06/29/2021 10:49   DG Chest Portable 1 View  Result Date: 06/29/2021 CLINICAL DATA:  Rule out pneumonia.  Productive cough. EXAM: PORTABLE CHEST 1 VIEW COMPARISON:  05/12/2021 FINDINGS: Heart size is normal. Lungs are free of focal consolidations and pleural effusions. There is mild perihilar peribronchial thickening. Degenerative changes are seen in the thoracic spine. IMPRESSION: Mild bronchitic changes. Electronically Signed   By: Nolon Nations M.D.   On: 06/29/2021 10:26    EKG: Independently reviewed.  Normal sinus rhythm, right bundle branch block.  When compared from prior no acute changes.  Assessment/Plan Active Problems:   AMS (altered mental status)    Altered mental status.  Most likely metabolic with some underlying viral/bacterial infection.  Patient was also having some chills, generalized body aches and poor p.o. intakes since yesterday. History of becoming altered with viral and bacterial infections. Appears little dehydrated.  Mental status seems improving since she came to ED.  No leukocytosis, she was afebrile on arrival. -Monitor for any fever -Check blood and urine cultures although UA is not very impressive. -Check procalcitonin -LR at 100 mL/h for 20 hours as she appears little dry. -Continue with supportive care  COVID-19 PCR positive.  CT value of 38.5 which makes it a prior infection.  No recent infectivity.  Patient was tested positive on June 13, 2021 when she was admitted at [facility] with altered mental status and received 5 days of remdesivir.  No respiratory symptoms.  Saturating well on room air -No need for isolation at this time most likely a persistently positive test. -Patient is vaccinated with 2 doses and 1 booster. -Continue with supportive care and vitamins.  Generalized weakness. -PT evaluation  History of prior DVT and PE. -Continue home dose of Eliquis.  Hypertension.   Blood pressure mildly elevated.  Patient has not taken her morning meds. -Continue home dose of amlodipine and spironolactone.   DVT prophylaxis: Eliquis Code Status: Full code Family Communication: Talked with daughter on phone.  Please notify daughter before discharge so she can arrange transportation back home as she herself lives at . Disposition Plan: Most likely be back home Consults called: None Admission status: Observation   Lorella Nimrod MD [facility]  If 7PM-7AM, please contact night-coverage [email or web address]  06/29/2021, 11:45 AM   This record has been created using Systems analyst. Errors have been sought and corrected,but may not always be located. Such creation errors do not reflect on the standard of care.

## 2021-06-29 NOTE — ED Triage Notes (Signed)
Pt via EMS from home. Neighbors occasionally check in with pt and this AM when they found her she was not responsive and lethargic. On arrival to the ED, pt is lethargic but able to follow commands and responsive to voice. Pt is alert. Pt was recently d/c from a hospital in [place].

## 2021-06-29 NOTE — Progress Notes (Signed)
PT was previously yellow, when came to floor from ED, vitals rechecked  06/29/21 1531  Assess: MEWS Score  Temp 98.3 F (36.8 C)  BP (!) 153/80  Pulse Rate 81  Resp 18  SpO2 97 %  O2 Device Room Air  Assess: MEWS Score  MEWS Temp 0  MEWS Systolic 0  MEWS Pulse 0  MEWS RR 0  MEWS LOC 0  MEWS Score 0  MEWS Score Color Green  Assess: if the MEWS score is Yellow or Red  Were vital signs taken at a resting state? Yes  Focused Assessment Change from prior assessment (see assessment flowsheet)  MEWS guidelines implemented *See Row Information* No, vital signs rechecked  Assess: SIRS CRITERIA  SIRS Temperature  0  SIRS Pulse 0  SIRS Respirations  0  SIRS WBC 0  SIRS Score Sum  0
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
## 2021-06-29 NOTE — ED Notes (Addendum)
Critical result- patient COVID positive. Starleen Blue, MD aware.

## 2021-06-30 DIAGNOSIS — I10 Essential (primary) hypertension: Secondary | ICD-10-CM

## 2021-06-30 DIAGNOSIS — F419 Anxiety disorder, unspecified: Secondary | ICD-10-CM | POA: Diagnosis not present

## 2021-06-30 DIAGNOSIS — R4182 Altered mental status, unspecified: Secondary | ICD-10-CM | POA: Diagnosis not present

## 2021-06-30 DIAGNOSIS — I480 Paroxysmal atrial fibrillation: Secondary | ICD-10-CM | POA: Diagnosis not present

## 2021-06-30 DIAGNOSIS — I8290 Acute embolism and thrombosis of unspecified vein: Secondary | ICD-10-CM | POA: Diagnosis not present

## 2021-06-30 DIAGNOSIS — U071 COVID-19: Secondary | ICD-10-CM | POA: Diagnosis not present

## 2021-06-30 LAB — COMPREHENSIVE METABOLIC PANEL
ALT: 14 U/L (ref 0–44)
AST: 25 U/L (ref 15–41)
Albumin: 3.8 g/dL (ref 3.5–5.0)
Alkaline Phosphatase: 62 U/L (ref 38–126)
Anion gap: 11 (ref 5–15)
BUN: 8 mg/dL (ref 8–23)
CO2: 25 mmol/L (ref 22–32)
Calcium: 9.2 mg/dL (ref 8.9–10.3)
Chloride: 101 mmol/L (ref 98–111)
Creatinine, Ser: 0.76 mg/dL (ref 0.44–1.00)
GFR, Estimated: >60 mL/min (ref >60)
Glucose, Bld: 82 mg/dL (ref 70–99)
Potassium: 3.7 mmol/L (ref 3.5–5.1)
Sodium: 137 mmol/L (ref 135–145)
Total Bilirubin: 1.3 mg/dL — ABNORMAL HIGH (ref 0.3–1.2)
Total Protein: 6.9 g/dL (ref 6.5–8.1)

## 2021-06-30 LAB — CBC
HCT: 37.9 % (ref 36.0–46.0)
Hemoglobin: 12.9 g/dL (ref 12.0–15.0)
MCH: 31.2 pg (ref 26.0–34.0)
MCHC: 34 g/dL (ref 30.0–36.0)
MCV: 91.5 fL (ref 80.0–100.0)
Platelets: 223 10*3/uL (ref 150–400)
RBC: 4.14 MIL/uL (ref 3.87–5.11)
RDW: 15 % (ref 11.5–15.5)
WBC: 7.7 10*3/uL (ref 4.0–10.5)
nRBC: 0 % (ref 0.0–0.2)

## 2021-06-30 LAB — MAGNESIUM: Magnesium: 1.9 mg/dL (ref 1.7–2.4)

## 2021-06-30 LAB — PHOSPHORUS: Phosphorus: 2.9 mg/dL (ref 2.5–4.6)

## 2021-06-30 MED ORDER — TRAZODONE HCL 50 MG PO TABS
50.0000 mg | ORAL_TABLET | Freq: Every day | ORAL | Status: DC
  Administered 2021-06-30 – 2021-07-03 (×4): 50 mg via ORAL
  Filled 2021-06-30 (×4): qty 1
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
## 2021-06-30 NOTE — Evaluation (Signed)
Physical Therapy Evaluation Patient Details Name: Erin Sutton MRN: [id number] DOB: [birth year] Today's Date: 06/30/2021   History of Present Illness  Erin Sutton is a 50yoF who comes to [facility] on 06/29/21 c AMS foun din bed at home by neighbor. PMH: GAD, depression, HTN, HLD, AF, DVT, PE, GERD, MDD, hypoTSH, dementia, COVID19 infection, OSA, osteoporsis, Lt TKA, incidental remote Lt parietooccipital and thalamic changes on CT. Pt was admitted here in June, [place] with HHPT services due to weakness, then was in [place] to visit family, contracted COVID infection and was admitted there for 7 days beginning 7/21.  Clinical Impression  Pt admitted with above diagnosis. Pt currently with functional limitations due to the deficits listed below (see "PT Problem List"). Upon entry, pt in bed, awake and agreeable to participate. The pt is alert, pleasant, interactive, and able to provide info regarding prior level of function, both in tolerance and independence, however after sitting at EOB, pt has onset dizziness, drowsiness, and increased postural sway requiring physical assist for safety and support. MinA for SPT to recliner, dizziness persists. Pt reports she gets dizzy like this at home when she takes her amlodipine. Pt agreeable to chair transfers to allow for ease in linen change as bed is soaked with urine. Pt is agreeable to STR placement recommendation as she remains deconditioned, has been unable to resume IADL performance, and her only available support will be unavailable 2/2 medical procedure. Patient's performance this date reveals decreased ability, independence, and tolerance in performing all basic mobility required for performance of activities of daily living. Pt requires additional DME, close physical assistance, and cues for safe participate in mobility. Pt will benefit from skilled PT intervention to increase independence and safety with basic mobility in preparation for discharge to the  venue listed below.       Follow Up Recommendations Supervision for mobility/OOB;SNF    Equipment Recommendations  None recommended by PT    Recommendations for Other Services       Precautions / Restrictions Precautions Precautions: Fall Restrictions Weight Bearing Restrictions: No      Mobility  Bed Mobility Overal bed mobility: Needs Assistance Bed Mobility: Supine to Sit     Supine to sit: Min guard     General bed mobility comments: additional time and effort to get to EOB, minA thrice for trunk stability due to dizziness and excesive sway    Transfers Overall transfer level: Needs assistance Equipment used: Rolling walker (2 wheeled) Transfers: Stand Pivot Transfers   Stand pivot transfers: Min assist       General transfer comment: minA to rise, minA for trunk stability during transfer to recliner; dizziness worsens per pt, much more lethargic after transfer.  Ambulation/Gait Ambulation/Gait assistance:  (deferred for safety, pt increasingly lethatrgic/unsteady with mobility.)              Stairs            Wheelchair Mobility    Modified Rankin (Stroke Patients Only)       Balance                                             Pertinent Vitals/Pain Pain Assessment: No/denies pain    Home Living Family/patient expects to be discharged to:: Private residence Living Arrangements: Alone Available Help at Discharge: Friend(s);Available PRN/intermittently Type of Home: Mobile home Home Access: Stairs  to enter Entrance Stairs-Rails: Right Entrance Stairs-Number of Steps: 3 Home Layout: One level Home Equipment: [place] - 2 wheels;Cane - single point;Bedside commode Additional Comments: 3-wheel walker    Prior Function Level of Independence: Independent with assistive device(s)         Comments: has been able to resume driving, IADL since hospitalization. has been using 3 wheel walker for household AMB since  return to home.     Hand Dominance   Dominant Hand: Right    Extremity/Trunk Assessment                Communication      Cognition Arousal/Alertness: Lethargic;Suspect due to medications Behavior During Therapy: [facility] for tasks assessed/performed Overall Cognitive Status: Within Functional Limits for tasks assessed                                        General Comments      Exercises     Assessment/Plan    PT Assessment Patient needs continued PT services  PT Problem List Decreased strength;Decreased range of motion;Decreased activity tolerance;Decreased balance;Decreased mobility;Decreased coordination;Decreased cognition;Decreased knowledge of use of DME;Decreased safety awareness;Decreased knowledge of precautions       PT Treatment Interventions DME instruction;Gait training;Balance training;Stair training;Functional mobility training;Therapeutic activities;Therapeutic exercise;Patient/family education    PT Goals (Current goals can be found in the Care Plan section)  Acute Rehab PT Goals Patient Stated Goal: regain ability to perform IADL again PT Goal Formulation: With patient Time For Goal Achievement: 07/14/21 Potential to Achieve Goals: Fair    Frequency Min 2X/week   Barriers to discharge        Co-evaluation               AM-PAC PT "6 Clicks" Mobility  Outcome Measure Help needed turning from your back to your side while in a flat bed without using bedrails?: A Lot Help needed moving from lying on your back to sitting on the side of a flat bed without using bedrails?: A Lot Help needed moving to and from a bed to a chair (including a wheelchair)?: A Lot Help needed standing up from a chair using your arms (e.g., wheelchair or bedside chair)?: A Lot Help needed to walk in hospital room?: A Lot Help needed climbing 3-5 steps with a railing? : A Lot 6 Click Score: 12    End of Session Equipment Utilized During Treatment:  Oxygen Activity Tolerance: Patient limited by fatigue;Patient limited by lethargy Patient left: in chair;with call bell/phone within reach Nurse Communication: Mobility status (bed is pee) PT Visit Diagnosis: Unsteadiness on feet (R26.81);Other abnormalities of gait and mobility (R26.89);Difficulty in walking, not elsewhere classified (R26.2);Muscle weakness (generalized) (M62.81)    Time: 7903-8333 PT Time Calculation (min) (ACUTE ONLY): 20 min   Charges:   PT Evaluation $PT Eval Moderate Complexity: 1 Mod         11:38 AM, 06/30/21 Etta Grandchild, PT, DPT Physical Therapist - [facility]  [phone] ([place])    George West C 06/30/2021, 11:32 AM

## 2021-06-30 NOTE — Care Management Obs Status (Signed)
[place] NOTIFICATION   Patient Details  Name: Erin Sutton MRN: [id number] Date of Birth: 14-Aug-1941   Medicare Observation Status Notification Given:  Yes  Reviewed via phone due to isolation status. Copy printed for patient. Asked RN to deliver.  [place], LCSW 06/30/2021, 2:34 PM

## 2021-06-30 NOTE — Progress Notes (Addendum)
PROGRESS NOTE        PATIENT DETAILS Name: NIARA BUNKER Age: 80 y.o. Sex: female Date of Birth: Aug 19, 1941 Admit Date: 06/29/2021 Admitting Physician Lorella Nimrod, MD VFI:[id number], Chrystie Nose, MD  Brief Narrative: Patient is a 80 y.o. 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  She was subsequently admitted to the hospitalist service for further evaluation and treatment.  Significant events: 8/6>> admit for confusion.  Significant studies: 8/6>> CT head: No acute intracranial abnormality. 8/6>> CXR: No pneumonia.  Antimicrobial therapy: None  Microbiology data: 8/6>> blood culture: No growth 8/6>> urine culture: Pending  Procedures : None  Consults: None  DVT Prophylaxis : apixaban (ELIQUIS) tablet 5 mg    Subjective: Much more awake and alert compared to what she was yesterday.  Denies any chest pain.  Continues to complain of some weakness.   Assessment/Plan: Transient confusion/encephalopathy: Unclear etiology-unclear if she has underlying cognitive dysfunction-and had a bout of delirium.  She also takes lorazepam as needed for anxiety.  CT head without any major abnormalities.  She is completely awake and alert and seems to be back to her baseline (no family at bedside to collaborate).  Plans are to continue supportive care.  Recent COVID-19 infection: Does not require any further isolation.  She tested positive on July 21-cycle threshold on the PCR on admission was 38.5.  She has no respiratory symptoms.  History of VTE: Continue Eliquis.  HTN: BP reasonable-continue amlodipine and Aldactone.  Anxiety/depression: No longer on Celexa-resume trazodone-continue as needed lorazepam for anxiety.  Debility/deconditioning/frailty: Suspect some amount  of frailty at baseline-probably worsened since she recently had COVID.  Evaluated by PT-felt to require SNF-social worker consulted.  Obesity: Estimated body mass index is 32.22 kg/m as calculated from the following:   Height as of this encounter: 5' (1.524 m).   Weight as of this encounter: 74.8 kg.   Diet: Diet Order             Diet Heart Room service appropriate? Yes; Fluid consistency: Thin  Diet effective now                    Code Status: Full code   Family Communication: Left voicemail for daughter-Tammy-[phone]-on 8/7  Disposition Plan: Status is: Observation  The patient will require care spanning > 2 midnights and should be moved to inpatient because: Unsafe d/c plan  Dispo: The patient is from: Home              Anticipated d/c is to: SNF              Patient currently is not medically stable to d/c.   Difficult to place patient No   Barriers to Discharge: Unsafe disposition-lives alone-appears to have some amount of cognitive dysfunction-PT recommending SNF.  Antimicrobial agents: Anti-infectives (From admission, onward)    Start     Dose/Rate Route Frequency Ordered Stop   06/30/21 1000  remdesivir 100 mg in sodium chloride 0.9 % 100 mL IVPB  Status:  Discontinued       See Hyperspace for full Linked Orders Report.   100 mg 200 mL/hr over 30 Minutes Intravenous Daily 06/29/21 1145 06/29/21 1245   06/29/21 1300  remdesivir  200 mg in sodium chloride 0.9% 250 mL IVPB  Status:  Discontinued       See Hyperspace for full Linked Orders Report.   200 mg 580 mL/hr over 30 Minutes Intravenous Once 06/29/21 1145 06/29/21 1245        Time spent: 25-minutes-Greater than 50% of this time was spent in counseling, explanation of diagnosis, planning of further management, and coordination of care.  MEDICATIONS: Scheduled Meds:  amLODipine  10 mg Oral Daily   apixaban  5 mg Oral BID   vitamin C  500 mg Oral Daily   atorvastatin  40 mg Oral Daily    levothyroxine  50 mcg Oral QAC breakfast   multivitamin with minerals  1 tablet Oral Daily   pantoprazole  40 mg Oral Daily   polyvinyl alcohol   Both Eyes BID   spironolactone  25 mg Oral Daily   traZODone  50 mg Oral QHS   zinc sulfate  220 mg Oral Daily   Continuous Infusions: PRN Meds:.acetaminophen, guaiFENesin-dextromethorphan, LORazepam, ondansetron **OR** ondansetron (ZOFRAN) IV, polyethylene glycol   PHYSICAL EXAM: Vital signs: Vitals:   06/30/21 0502 06/30/21 0753 06/30/21 1124 06/30/21 1203  BP: 133/80 (!) 141/68 (!) 170/80 (!) 152/84  Pulse: 81 93  91  Resp: 16 20  16   Temp: 98.1 F (36.7 C) 98.3 F (36.8 C)  98.2 F (36.8 C)  TempSrc:  Oral  Oral  SpO2: 99% 100% 100% 100%  Weight:      Height:       Filed Weights   06/29/21 1023  Weight: 74.8 kg   Body mass index is 32.22 kg/m.   Gen Exam:Alert awake-not in any distress HEENT:atraumatic, normocephalic Chest: B/L clear to auscultation anteriorly CVS:S1S2 regular Abdomen:soft non tender, non distended Extremities:no edema Neurology: Non focal Skin: no rash  I have personally reviewed following labs and imaging studies  LABORATORY DATA: CBC: Recent Labs  Lab 06/29/21 1003 06/30/21 0646  WBC 6.1 7.7  NEUTROABS 4.5  --   HGB 13.8 12.9  HCT 41.5 37.9  MCV 92.6 91.5  PLT 236 811    Basic Metabolic Panel: Recent Labs  Lab 06/29/21 1003 06/30/21 0646  NA 137 137  K 3.6 3.7  CL 102 101  CO2 23 25  GLUCOSE 106* 82  BUN 9 8  CREATININE 0.82 0.76  CALCIUM 9.7 9.2  MG  --  1.9  PHOS  --  2.9    GFR: Estimated Creatinine Clearance: 51.5 mL/min (by C-G formula based on SCr of 0.76 mg/dL).  Liver Function Tests: Recent Labs  Lab 06/29/21 1003 06/30/21 0646  AST 29 25  ALT 15 14  ALKPHOS 66 62  BILITOT 1.3* 1.3*  PROT 7.6 6.9  ALBUMIN 4.1 3.8   No results for input(s): LIPASE, AMYLASE in the last 168 hours. No results for input(s): AMMONIA in the last 168 hours.  Coagulation  Profile: Recent Labs  Lab 06/29/21 1003  INR 1.5*    Cardiac Enzymes: Recent Labs  Lab 06/29/21 1003  CKTOTAL 40    BNP (last 3 results) No results for input(s): PROBNP in the last 8760 hours.  Lipid Profile: No results for input(s): CHOL, HDL, LDLCALC, TRIG, CHOLHDL, LDLDIRECT in the last 72 hours.  Thyroid Function Tests: Recent Labs    06/29/21 1003  TSH 1.297    Anemia Panel: Recent Labs    06/29/21 1253  FERRITIN 16    Urine analysis:    Component Value Date/Time   COLORURINE  COLORLESS (A) 06/29/2021 1003   APPEARANCEUR CLEAR (A) 06/29/2021 1003   APPEARANCEUR Hazy 07/25/2014 1350   LABSPEC 1.005 06/29/2021 1003   LABSPEC 1.020 07/25/2014 1350   PHURINE 9.0 (H) 06/29/2021 1003   GLUCOSEU NEGATIVE 06/29/2021 1003   GLUCOSEU Negative 07/25/2014 1350   HGBUR SMALL (A) 06/29/2021 1003   BILIRUBINUR NEGATIVE 06/29/2021 1003   BILIRUBINUR Negative 07/25/2014 1350   KETONESUR 5 (A) 06/29/2021 1003   PROTEINUR NEGATIVE 06/29/2021 1003   NITRITE NEGATIVE 06/29/2021 1003   LEUKOCYTESUR NEGATIVE 06/29/2021 1003   LEUKOCYTESUR 2+ 07/25/2014 1350    Sepsis Labs: Lactic Acid, Venous    Component Value Date/Time   LATICACIDVEN 1.2 12/17/2016 1816    MICROBIOLOGY: Recent Results (from the past 240 hour(s))  Resp Panel by RT-PCR (Flu A&B, Covid) Nasopharyngeal Swab     Status: Abnormal   Collection Time: 06/29/21 10:03 AM   Specimen: Nasopharyngeal Swab; Nasopharyngeal(NP) swabs in vial transport medium  Result Value Ref Range Status   SARS Coronavirus 2 by RT PCR POSITIVE (A) NEGATIVE Final    Comment: RESULT CALLED TO, READ BACK BY AND VERIFIED WITH: LINDSAY BLACK ON 06/29/21 AT 1109 QSD (NOTE) SARS-CoV-2 target nucleic acids are DETECTED.  The SARS-CoV-2 RNA is generally detectable in upper respiratory specimens during the acute phase of infection. Positive results are indicative of the presence of the identified virus, but do not rule out bacterial  infection or co-infection with other pathogens not detected by the test. Clinical correlation with patient history and other diagnostic information is necessary to determine patient infection status. The expected result is Negative.  Fact Sheet for Patients: EntrepreneurPulse.com.au  Fact Sheet for Healthcare Providers: IncredibleEmployment.be  This test is not yet approved or cleared by the [place] FDA and  has been authorized for detection and/or diagnosis of SARS-CoV-2 by FDA under an Emergency Use Authorization (EUA).  This EUA will remain in effect (meaning this test can  be used) for the duration of  the COVID-19 declaration under Section 564(b)(1) of the Act, 21 U.S.C. section 360bbb-3(b)(1), unless the authorization is terminated or revoked sooner.     Influenza A by PCR NEGATIVE NEGATIVE Final   Influenza B by PCR NEGATIVE NEGATIVE Final    Comment: (NOTE) The Xpert Xpress SARS-CoV-2/FLU/RSV plus assay is intended as an aid in the diagnosis of influenza from Nasopharyngeal swab specimens and should not be used as a sole basis for treatment. Nasal washings and aspirates are unacceptable for Xpert Xpress SARS-CoV-2/FLU/RSV testing.  Fact Sheet for Patients: EntrepreneurPulse.com.au  Fact Sheet for Healthcare Providers: IncredibleEmployment.be  This test is not yet approved or cleared by the [place] FDA and has been authorized for detection and/or diagnosis of SARS-CoV-2 by FDA under an Emergency Use Authorization (EUA). This EUA will remain in effect (meaning this test can be used) for the duration of the COVID-19 declaration under Section 564(b)(1) of the Act, 21 U.S.C. section 360bbb-3(b)(1), unless the authorization is terminated or revoked.  Performed at [facility], [address] [place], [place] [address]   Culture, blood (Routine X 2) w Reflex to ID Panel      Status: None (Preliminary result)   Collection Time: 06/29/21 12:53 PM   Specimen: BLOOD  Result Value Ref Range Status   Specimen Description BLOOD RIGHT ANTECUBITAL  Final   Special Requests   Final    BOTTLES DRAWN AEROBIC AND ANAEROBIC Blood Culture results may not be optimal due to an excessive volume of blood received in  culture bottles   Culture   Final    NO GROWTH < 24 HOURS Performed at [facility], [address] [place], [place] [address]    Report Status PENDING  Incomplete  Culture, blood (Routine X 2) w Reflex to ID Panel     Status: None (Preliminary result)   Collection Time: 06/29/21 12:55 PM   Specimen: BLOOD  Result Value Ref Range Status   Specimen Description BLOOD LEFT ANTECUBITAL  Final   Special Requests   Final    BOTTLES DRAWN AEROBIC AND ANAEROBIC Blood Culture adequate volume   Culture   Final    NO GROWTH < 24 HOURS Performed at [facility], [address] [place], [place] [address]    Report Status PENDING  Incomplete    RADIOLOGY STUDIES/RESULTS: CT HEAD WO CONTRAST (5MM)  Result Date: 06/29/2021 CLINICAL DATA:  80 year old female with altered mental status. EXAM: CT HEAD WITHOUT CONTRAST TECHNIQUE: Contiguous axial images were obtained from the base of the skull through the vertex without intravenous contrast. COMPARISON:  05/12/2021 CT and prior studies FINDINGS: Brain: No evidence of acute infarction, hemorrhage, hydrocephalus, extra-axial collection or mass lesion/mass effect. Mild chronic small-vessel white matter ischemic changes. 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 Vascular: Carotid atherosclerotic calcifications are noted. Skull: Normal. Negative for fracture or focal lesion. Sinuses/Orbits: No acute finding. Other: None. IMPRESSION: 1. No evidence of acute intracranial abnormality. 2. 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 Electronically Signed   By: Margarette Canada M.D.   On: 06/29/2021 10:49   DG Chest Portable 1 View  Result Date: 06/29/2021 CLINICAL DATA:  Rule out pneumonia.  Productive cough. EXAM: PORTABLE CHEST 1 VIEW COMPARISON:  05/12/2021 FINDINGS: Heart size is normal. Lungs are free of focal consolidations and pleural effusions. There is mild perihilar peribronchial thickening. Degenerative changes are seen in the thoracic spine. IMPRESSION: Mild bronchitic changes. Electronically Signed   By: Nolon Nations M.D.   On: 06/29/2021 10:26     LOS: 0 days   Oren Binet, MD  [facility]    To contact the attending provider between 7A-7P or the covering provider during after hours 7P-7A, please log into the web site [email or web address] and access using universal [place] password for that web site. If you do not have the password, please call the hospital operator.  06/30/2021, 1:01 PM

## 2021-07-01 DIAGNOSIS — R4182 Altered mental status, unspecified: Secondary | ICD-10-CM | POA: Diagnosis not present

## 2021-07-01 DIAGNOSIS — I480 Paroxysmal atrial fibrillation: Secondary | ICD-10-CM | POA: Diagnosis not present

## 2021-07-01 DIAGNOSIS — I8290 Acute embolism and thrombosis of unspecified vein: Secondary | ICD-10-CM | POA: Diagnosis not present

## 2021-07-01 DIAGNOSIS — F419 Anxiety disorder, unspecified: Secondary | ICD-10-CM | POA: Diagnosis not present

## 2021-07-01 DIAGNOSIS — I10 Essential (primary) hypertension: Secondary | ICD-10-CM | POA: Diagnosis not present

## 2021-07-01 DIAGNOSIS — U071 COVID-19: Secondary | ICD-10-CM | POA: Diagnosis not present

## 2021-07-01 NOTE — Progress Notes (Signed)
Physical Therapy Treatment Patient Details Name: ROXI HLAVATY MRN: [id number] DOB: 1941-09-07 Today's Date: 07/01/2021    History of Present Illness Raelene Trew is a 77yoF who comes to [facility] on 06/29/21 with AMS found in bed at home by neighbor. PMH: GAD, depression, HTN, HLD, AF, DVT, PE, GERD, MDD, hypoTSH, dementia, COVID19 infection, OSA, osteoporsis, Lt TKA, incidental remote Lt parietooccipital and thalamic changes on CT. Pt was admitted here in June, [place] with HHPT services due to weakness, then was in [place] to visit family, contracted COVID infection and was admitted there for 7 days beginning 7/21.    PT Comments    Pt in supine seated upright on bed agreeable and excited to participate. HR trending in upper 80's on tele and SPO2 at 98% on RA. Supervision required from supine to seated EOB with HOB elevated and use of handrails. [place] for STS to RW and amb 20' in room. X3 standing rest breaks due to RR > 25 and HR in 120's and reports of LE fatigue. Pt returned to recliner with good sequencing and hand placement requiring no cues with HR returning to rest levels and Spo2 >95% throughout session. Pt remains deconditioned as evidenced by vitals with very short household distances thus warranting STR still remaining appropriate at D/c as pt lives alone with no support.    Follow Up Recommendations  Supervision for mobility/OOB;SNF     Equipment Recommendations  None recommended by PT    Recommendations for Other Services       Precautions / Restrictions Precautions Precautions: Fall Restrictions Weight Bearing Restrictions: No    Mobility  Bed Mobility Overal bed mobility: Needs Assistance Bed Mobility: Supine to Sit     Supine to sit: Supervision;HOB elevated     General bed mobility comments: able to perform with no external assist. Needs bed rails to perform    Transfers Overall transfer level: Needs assistance Equipment used: Rolling walker (2  wheeled) Transfers: Sit to/from Stand Sit to Stand: Min guard            Ambulation/Gait   Gait Distance (Feet): 20 Feet Assistive device: Rolling walker (2 wheeled) Gait Pattern/deviations: Step-to pattern;Decreased step length - right;Decreased step length - left;Narrow base of support;Trunk flexed     General Gait Details: SLow and cautions gait. x3 standing rest breaks due to RR reaching > 25.   Stairs             Wheelchair Mobility    Modified Rankin (Stroke Patients Only)       Balance Overall balance assessment: Needs assistance Sitting-balance support: No upper extremity supported;Feet supported Sitting balance-Leahy Scale: Good     Standing balance support: Bilateral upper extremity supported;During functional activity Standing balance-Leahy Scale: Poor                              Cognition                                              Exercises      General Comments        Pertinent Vitals/Pain Pain Assessment: Faces Faces Pain Scale: Hurts a little bit Pain Location: R knee Pain Descriptors / Indicators: Aching Pain Intervention(s): Limited activity within patient's tolerance;Monitored during session;Repositioned    Home Living  Prior Function            PT Goals (current goals can now be found in the care plan section) Acute Rehab PT Goals Patient Stated Goal: regain ability to perform IADL again Time For Goal Achievement: 07/14/21 Potential to Achieve Goals: Fair Progress towards PT goals: Progressing toward goals    Frequency    Min 2X/week      PT Plan Current plan remains appropriate    Co-evaluation              AM-PAC PT "6 Clicks" Mobility   Outcome Measure  Help needed turning from your back to your side while in a flat bed without using bedrails?: A Lot Help needed moving from lying on your back to sitting on the side of a flat bed without  using bedrails?: A Lot Help needed moving to and from a bed to a chair (including a wheelchair)?: A Little Help needed standing up from a chair using your arms (e.g., wheelchair or bedside chair)?: A Little Help needed to walk in hospital room?: A Little Help needed climbing 3-5 steps with a railing? : A Lot 6 Click Score: 15    End of Session Equipment Utilized During Treatment: Gait belt Activity Tolerance: Patient tolerated treatment well;Patient limited by fatigue Patient left: in chair;with call bell/phone within reach Nurse Communication: Mobility status PT Visit Diagnosis: Unsteadiness on feet (R26.81);Other abnormalities of gait and mobility (R26.89);Difficulty in walking, not elsewhere classified (R26.2);Muscle weakness (generalized) (M62.81)     Time: 6553-7482 PT Time Calculation (min) (ACUTE ONLY): 12 min  Charges:  $Gait Training: 8-22 mins                     Salem Caster. Fairly IV, PT, DPT Physical Therapist- [facility]  07/01/2021, 4:24 PM

## 2021-07-01 NOTE — Evaluation (Addendum)
Occupational Therapy Evaluation Patient Details Name: LUCINE BILSKI MRN: [id number] DOB: 1941-10-23 Today's Date: 07/01/2021    History of Present Illness Virgilia Quigg is a 16yoF who comes to [facility] on 06/29/21 with AMS found in bed at home by neighbor. PMH: GAD, depression, HTN, HLD, AF, DVT, PE, GERD, MDD, hypoTSH, dementia, COVID19 infection, OSA, osteoporsis, Lt TKA, incidental remote Lt parietooccipital and thalamic changes on CT. Pt was admitted here in June, [place] with HHPT services due to weakness, then was in [place] to visit family, contracted COVID infection and was admitted there for 7 days beginning 7/21.   Clinical Impression   Pt seen for OT evaluation this date. Upon arrival to room, pt c/o of nausea, however agreeable to OT evaluation and OOB mobility. Pt A&Ox4, however requires increased processing time. At baseline, pt is independent in ADLs/IADLs and functional mobility with AD, living alone in a mobile home with 3 steps to enter. Pt endorses decreased activity tolerance since recent hospital admissions, warranting increased assistance from neighbor for IADLs. Pt reports that neighbor is undergoing heart surgery 8/10 and will not be available for assistance following d/c. Pt currently requires MIN GUARD for bed-level LB dressing, MIN GUARD for bed mobility, and MIN A for stand pivot transfer with RW due to current functional impairments (See OT Problem List below). Pt reported nausea decreasing following mobility. At end of session, pt left with breakfast and RN present. Pt would benefit from additional skilled OT services to maximize return to PLOF and minimize risk of future falls, injury, caregiver burden, and readmission. Upon discharge, recommend SNF.    Follow Up Recommendations  SNF    Equipment Recommendations  None recommended by OT       Precautions / Restrictions Precautions Precautions: Fall Restrictions Weight Bearing Restrictions: No      Mobility Bed  Mobility Overal bed mobility: Needs Assistance Bed Mobility: Supine to Sit     Supine to sit: Min guard;HOB elevated     General bed mobility comments: additional time and effort to get to EOB    Transfers Overall transfer level: Needs assistance Equipment used: Rolling walker (2 wheeled) Transfers: Sit to/from [facility] Sit to Stand: Min assist Stand pivot transfers: Min assist       General transfer comment: pt denying dizziness and reporting nausea improving following mobility    Balance Overall balance assessment: Needs assistance Sitting-balance support: No upper extremity supported;Feet supported Sitting balance-Leahy Scale: Good Sitting balance - Comments: good sitting balance reaching within BOS at EOB   Standing balance support: Bilateral upper extremity supported;During functional activity Standing balance-Leahy Scale: Poor Standing balance comment: MIN A to maintain standing balance during stand pivot transfer                           ADL either performed or assessed with clinical judgement   ADL Overall ADL's : Needs assistance/impaired Eating/Feeding: Independent;Sitting                   Lower Body Dressing: Min guard;Bed level Lower Body Dressing Details (indicate cue type and reason): to don socks             Functional mobility during ADLs: Minimal assistance;Rolling walker General ADL Comments: Anticipate SET-UP/SUPERVISION required for seated UB ADLs.     Vision Patient Visual Report: No change from baseline              Pertinent Vitals/Pain Pain  Assessment: 0-10 Pain Score: 8  Pain Location: R knee Pain Descriptors / Indicators: Aching Pain Intervention(s): Limited activity within patient's tolerance;Monitored during session;Repositioned     Hand Dominance Right   Extremity/Trunk Assessment Upper Extremity Assessment Upper Extremity Assessment: Overall WFL for tasks assessed   Lower  Extremity Assessment Lower Extremity Assessment: Generalized weakness       Communication Communication Communication: No difficulties   Cognition Arousal/Alertness: Awake/alert Behavior During Therapy: WFL for tasks assessed/performed Overall Cognitive Status: Within Functional Limits for tasks assessed                                 General Comments: Pt A&Ox4. At beginning of session, pt c/o of nausea, however pleasant and agreeable throughout. Requires increased processing time              Home Living Family/patient expects to be discharged to:: Private residence Living Arrangements: Alone Available Help at Discharge: Available PRN/intermittently;Neighbor (neighbor going into heart surgery this Wed and will not be able to help. Family lives out of state) Type of Home: Mobile home Home Access: Stairs to enter Entrance Stairs-Number of Steps: 3 Entrance Stairs-Rails: Right Home Layout: One level     Bathroom Shower/Tub: Tub/shower unit         Home Equipment: Environmental consultant - 2 wheels;Cane - single point;Bedside commode;Tub bench;Hand held shower head   Additional Comments: 3-wheel walker      Prior Functioning/Environment Level of Independence: Independent with assistive device(s)        Comments: Pt has been using 3 wheel walker for household AMB since return to home. Pt independent with ADLs (occassionally sponge-bathes). Hasnt drove since last hospital admission. Friends assisting with grocery shopping        OT Problem List: Decreased strength;Decreased activity tolerance;Impaired balance (sitting and/or standing);Decreased cognition      OT Treatment/Interventions: Self-care/ADL training;Therapeutic exercise;Energy conservation;DME and/or AE instruction;Therapeutic activities;Patient/family education;Balance training    OT Goals(Current goals can be found in the care plan section) Acute Rehab OT Goals Patient Stated Goal: regain ability to  perform IADL again OT Goal Formulation: With patient Time For Goal Achievement: 07/15/21 Potential to Achieve Goals: Fair ADL Goals Pt Will Perform Grooming: with min guard assist;standing Pt Will Perform Lower Body Dressing: with min guard assist;sitting/lateral leans Pt Will Transfer to Toilet: with min guard assist;ambulating;bedside commode  OT Frequency: Min 1X/week    AM-PAC OT "6 Clicks" Daily Activity     Outcome Measure Help from another person eating meals?: None Help from another person taking care of personal grooming?: A Little Help from another person toileting, which includes using toliet, bedpan, or urinal?: A Little Help from another person bathing (including washing, rinsing, drying)?: A Lot Help from another person to put on and taking off regular upper body clothing?: None Help from another person to put on and taking off regular lower body clothing?: A Little 6 Click Score: 19   End of Session Equipment Utilized During Treatment: Gait belt;Rolling walker Nurse Communication: Mobility status  Activity Tolerance: Patient tolerated treatment well Patient left: in chair;with call bell/phone within reach;with nursing/sitter in room  OT Visit Diagnosis: Unsteadiness on feet (R26.81);Muscle weakness (generalized) (M62.81)                Time: 3474-2595 OT Time Calculation (min): 23 min Charges:  OT General Charges $OT Visit: 1 Visit OT Evaluation $OT Eval Moderate Complexity: 1 Mod OT Treatments $Self  Care/Home Management : 8-22 mins  Fredirick Maudlin, OTR/L [place]

## 2021-07-01 NOTE — Progress Notes (Signed)
PT Cancellation Note  Patient Details Name: Erin Sutton MRN: [id number] DOB: July 04, 1941   Cancelled Treatment:    Reason Eval/Treat Not Completed: Patient declined, no reason specified. Upon entry to room pt sleeping. Pt wishing to return to sleep and for PT to re-attempt at later time or date. Will re-attempt as available.   Salem Caster. Fairly IV, PT, DPT Physical Therapist- [facility]  07/01/2021, 2:07 PM

## 2021-07-01 NOTE — Progress Notes (Signed)
PROGRESS NOTE        PATIENT DETAILS Name: Erin Sutton Age: 80 y.o. Sex: female Date of Birth: [birth year] Admit Date: 06/29/2021 Admitting Physician Lorella Nimrod, MD MLY:[id number], Chrystie Nose, MD  Brief Narrative: Patient is a 80 y.o. 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  She was subsequently admitted to the hospitalist service for further evaluation and treatment.  Significant events: 8/6>> admit for confusion.  Significant studies: 8/6>> CT head: No acute intracranial abnormality. 8/6>> CXR: No pneumonia.  Antimicrobial therapy: None  Microbiology data: 8/6>> blood culture: No growth 8/6>> urine culture: 20,000 colonies of Klebsiella pneumoniae, 30,000 colonies of Proteus mirabilis.  Procedures : None  Consults: None  DVT Prophylaxis : apixaban (ELIQUIS) tablet 5 mg    Subjective: Some nausea-but appears unchanged.  Complains of weakness.   Assessment/Plan: Transient confusion/encephalopathy: Resolved-unclear etiology.  Could have some mild cognitive dysfunction at baseline-did could have worsened due to delirium/lorazepam use.  CT head without any acute abnormalities.  Continue supportive care.  Recent COVID-19 infection: Does not require any further isolation.  She tested positive on July 21-cycle threshold on the PCR on admission was 38.5.  She has no respiratory symptoms.  History of VTE: Continue Eliquis.  PAF: Sinus rhythm on EKG on admission-not on any rate control medications-on Eliquis.  HTN: BP reasonable-continue amlodipine and Aldactone.  Asymptomatic bacteriuria: She has no symptoms of UTI-UA was benign-urine culture as above-likely reflective of colonization rather than a true infection.  Do not think she needs antimicrobial  therapy.  Anxiety/depression: No longer on Celexa-continue trazodone-on as needed lorazepam (long-term med) for anxiety.  Debility/deconditioning/frailty: Suspect some amount of frailty at baseline-probably worsened since she recently had COVID.  Evaluated by PT-felt to require SNF-social worker following.  Obesity: Estimated body mass index is 32.22 kg/m as calculated from the following:   Height as of this encounter: 5' (1.524 m).   Weight as of this encounter: 74.8 kg.   Diet: Diet Order             Diet Heart Room service appropriate? Yes; Fluid consistency: Thin  Diet effective now                    Code Status: Full code   Family Communication: Spoke with daughter-[phone]-on 8/8  Disposition Plan: Status is: Observation  The patient will require care spanning > 2 midnights and should be moved to inpatient because: Unsafe d/c plan  Dispo: The patient is from: Home              Anticipated d/c is to: SNF              Patient currently is not medically stable to d/c.   Difficult to place patient No   Barriers to Discharge: Unsafe disposition-lives alone-appears to have some amount of cognitive dysfunction-PT recommending SNF.  Antimicrobial agents: Anti-infectives (From admission, onward)    Start     Dose/Rate Route Frequency Ordered Stop   06/30/21 1000  remdesivir 100 mg in sodium chloride 0.9 % 100 mL IVPB  Status:  Discontinued       See Hyperspace for full Linked Orders Report.   100 mg 200 mL/hr over 30 Minutes Intravenous  Daily 06/29/21 1145 06/29/21 1245   06/29/21 1300  remdesivir 200 mg in sodium chloride 0.9% 250 mL IVPB  Status:  Discontinued       See Hyperspace for full Linked Orders Report.   200 mg 580 mL/hr over 30 Minutes Intravenous Once 06/29/21 1145 06/29/21 1245        Time spent: 25-minutes-Greater than 50% of this time was spent in counseling, explanation of diagnosis, planning of further management, and  coordination of care.  MEDICATIONS: Scheduled Meds:  amLODipine  10 mg Oral Daily   apixaban  5 mg Oral BID   vitamin C  500 mg Oral Daily   atorvastatin  40 mg Oral Daily   levothyroxine  50 mcg Oral QAC breakfast   multivitamin with minerals  1 tablet Oral Daily   pantoprazole  40 mg Oral Daily   polyvinyl alcohol   Both Eyes BID   spironolactone  25 mg Oral Daily   traZODone  50 mg Oral QHS   zinc sulfate  220 mg Oral Daily   Continuous Infusions: PRN Meds:.acetaminophen, guaiFENesin-dextromethorphan, LORazepam, ondansetron **OR** ondansetron (ZOFRAN) IV, polyethylene glycol   PHYSICAL EXAM: Vital signs: Vitals:   07/01/21 0603 07/01/21 0848 07/01/21 1137 07/01/21 1139  BP: 138/86 (!) 147/79  (!) 147/79  Pulse: 83 87 93 90  Resp: 16 15 14 15   Temp: 98 F (36.7 C) 98.4 F (36.9 C) 97.8 F (36.6 C)   TempSrc: Oral Oral Oral   SpO2: 100% 100% 100% 100%  Weight:      Height:       Filed Weights   06/29/21 1023  Weight: 74.8 kg   Body mass index is 32.22 kg/m.   Gen Exam:Alert awake-not in any distress HEENT:atraumatic, normocephalic Chest: B/L clear to auscultation anteriorly CVS:S1S2 regular Abdomen:soft non tender, non distended Extremities:no edema Neurology: Non focal Skin: no rash   I have personally reviewed following labs and imaging studies  LABORATORY DATA: CBC: Recent Labs  Lab 06/29/21 1003 06/30/21 0646  WBC 6.1 7.7  NEUTROABS 4.5  --   HGB 13.8 12.9  HCT 41.5 37.9  MCV 92.6 91.5  PLT 236 223     Basic Metabolic Panel: Recent Labs  Lab 06/29/21 1003 06/30/21 0646  NA 137 137  K 3.6 3.7  CL 102 101  CO2 23 25  GLUCOSE 106* 82  BUN 9 8  CREATININE 0.82 0.76  CALCIUM 9.7 9.2  MG  --  1.9  PHOS  --  2.9     GFR: Estimated Creatinine Clearance: 51.5 mL/min (by C-G formula based on SCr of 0.76 mg/dL).  Liver Function Tests: Recent Labs  Lab 06/29/21 1003 06/30/21 0646  AST 29 25  ALT 15 14  ALKPHOS 66 62  BILITOT  1.3* 1.3*  PROT 7.6 6.9  ALBUMIN 4.1 3.8    No results for input(s): LIPASE, AMYLASE in the last 168 hours. No results for input(s): AMMONIA in the last 168 hours.  Coagulation Profile: Recent Labs  Lab 06/29/21 1003  INR 1.5*     Cardiac Enzymes: Recent Labs  Lab 06/29/21 1003  CKTOTAL 40     BNP (last 3 results) No results for input(s): PROBNP in the last 8760 hours.  Lipid Profile: No results for input(s): CHOL, HDL, LDLCALC, TRIG, CHOLHDL, LDLDIRECT in the last 72 hours.  Thyroid Function Tests: Recent Labs    06/29/21 1003  TSH 1.297     Anemia Panel: Recent Labs    06/29/21 1253  FERRITIN 16     Urine analysis:    Component Value Date/Time   COLORURINE COLORLESS (A) 06/29/2021 1003   APPEARANCEUR CLEAR (A) 06/29/2021 1003   APPEARANCEUR Hazy 07/25/2014 1350   LABSPEC 1.005 06/29/2021 1003   LABSPEC 1.020 07/25/2014 1350   PHURINE 9.0 (H) 06/29/2021 1003   GLUCOSEU NEGATIVE 06/29/2021 1003   GLUCOSEU Negative 07/25/2014 1350   HGBUR SMALL (A) 06/29/2021 1003   BILIRUBINUR NEGATIVE 06/29/2021 1003   BILIRUBINUR Negative 07/25/2014 1350   KETONESUR 5 (A) 06/29/2021 1003   PROTEINUR NEGATIVE 06/29/2021 1003   NITRITE NEGATIVE 06/29/2021 1003   LEUKOCYTESUR NEGATIVE 06/29/2021 1003   LEUKOCYTESUR 2+ 07/25/2014 1350    Sepsis Labs: Lactic Acid, Venous    Component Value Date/Time   LATICACIDVEN 1.2 12/17/2016 1816    MICROBIOLOGY: Recent Results (from the past 240 hour(s))  Resp Panel by RT-PCR (Flu A&B, Covid) Nasopharyngeal Swab     Status: Abnormal   Collection Time: 06/29/21 10:03 AM   Specimen: Nasopharyngeal Swab; Nasopharyngeal(NP) swabs in vial transport medium  Result Value Ref Range Status   SARS Coronavirus 2 by RT PCR POSITIVE (A) NEGATIVE Final    Comment: RESULT CALLED TO, READ BACK BY AND VERIFIED WITH: LINDSAY BLACK ON 06/29/21 AT 1109 QSD (NOTE) SARS-CoV-2 target nucleic acids are DETECTED.  The SARS-CoV-2 RNA is  generally detectable in upper respiratory specimens during the acute phase of infection. Positive results are indicative of the presence of the identified virus, but do not rule out bacterial infection or co-infection with other pathogens not detected by the test. Clinical correlation with patient history and other diagnostic information is necessary to determine patient infection status. The expected result is Negative.  Fact Sheet for Patients: EntrepreneurPulse.com.au  Fact Sheet for Healthcare Providers: IncredibleEmployment.be  This test is not yet approved or cleared by the [place] FDA and  has been authorized for detection and/or diagnosis of SARS-CoV-2 by FDA under an Emergency Use Authorization (EUA).  This EUA will remain in effect (meaning this test can  be used) for the duration of  the COVID-19 declaration under Section 564(b)(1) of the Act, 21 U.S.C. section 360bbb-3(b)(1), unless the authorization is terminated or revoked sooner.     Influenza A by PCR NEGATIVE NEGATIVE Final   Influenza B by PCR NEGATIVE NEGATIVE Final    Comment: (NOTE) The Xpert Xpress SARS-CoV-2/FLU/RSV plus assay is intended as an aid in the diagnosis of influenza from Nasopharyngeal swab specimens and should not be used as a sole basis for treatment. Nasal washings and aspirates are unacceptable for Xpert Xpress SARS-CoV-2/FLU/RSV testing.  Fact Sheet for Patients: EntrepreneurPulse.com.au  Fact Sheet for Healthcare Providers: IncredibleEmployment.be  This test is not yet approved or cleared by the [place] FDA and has been authorized for detection and/or diagnosis of SARS-CoV-2 by FDA under an Emergency Use Authorization (EUA). This EUA will remain in effect (meaning this test can be used) for the duration of the COVID-19 declaration under Section 564(b)(1) of the Act, 21 U.S.C. section 360bbb-3(b)(1),  unless the authorization is terminated or revoked.  Performed at [facility], [address] [place], [place] [address]   Urine Culture     Status: Abnormal (Preliminary result)   Collection Time: 06/29/21 10:03 AM   Specimen: Urine, Random  Result Value Ref Range Status   Specimen Description   Final    URINE, RANDOM Performed at [facility], [address] [place], [place] [address]    Special Requests  Final    NONE Performed at [facility], [address] [place], [place] [address]    Culture (A)  Final    20,000 COLONIES/mL KLEBSIELLA PNEUMONIAE 30,000 COLONIES/mL PROTEUS MIRABILIS SUSCEPTIBILITIES TO FOLLOW Performed at [facility], [place] [address] [place], [place] [address]    Report Status PENDING  Incomplete  Culture, blood (Routine X 2) w Reflex to ID Panel     Status: None (Preliminary result)   Collection Time: 06/29/21 12:53 PM   Specimen: BLOOD  Result Value Ref Range Status   Specimen Description BLOOD RIGHT ANTECUBITAL  Final   Special Requests   Final    BOTTLES DRAWN AEROBIC AND ANAEROBIC Blood Culture results may not be optimal due to an excessive volume of blood received in culture bottles   Culture   Final    NO GROWTH 2 DAYS Performed at [facility], [address] [place], [place] [address]    Report Status PENDING  Incomplete  Culture, blood (Routine X 2) w Reflex to ID Panel     Status: None (Preliminary result)   Collection Time: 06/29/21 12:55 PM   Specimen: BLOOD  Result Value Ref Range Status   Specimen Description BLOOD LEFT ANTECUBITAL  Final   Special Requests   Final    BOTTLES DRAWN AEROBIC AND ANAEROBIC Blood Culture adequate volume   Culture   Final    NO GROWTH 2 DAYS Performed at [facility], [address] [place], [place] [address]    Report Status PENDING  Incomplete    RADIOLOGY STUDIES/RESULTS: No results found.   LOS: 0 days   Oren Binet,  MD  [facility]    To contact the attending provider between 7A-7P or the covering provider during after hours 7P-7A, please log into the web site [email or web address] and access using universal [place] password for that web site. If you do not have the password, please call the hospital operator.  07/01/2021, 12:37 PM

## 2021-07-02 DIAGNOSIS — I480 Paroxysmal atrial fibrillation: Secondary | ICD-10-CM | POA: Diagnosis not present

## 2021-07-02 DIAGNOSIS — I10 Essential (primary) hypertension: Secondary | ICD-10-CM | POA: Diagnosis not present

## 2021-07-02 DIAGNOSIS — F419 Anxiety disorder, unspecified: Secondary | ICD-10-CM | POA: Diagnosis not present

## 2021-07-02 DIAGNOSIS — F411 Generalized anxiety disorder: Secondary | ICD-10-CM | POA: Diagnosis not present

## 2021-07-02 DIAGNOSIS — K529 Noninfective gastroenteritis and colitis, unspecified: Secondary | ICD-10-CM | POA: Diagnosis not present

## 2021-07-02 DIAGNOSIS — N189 Chronic kidney disease, unspecified: Secondary | ICD-10-CM | POA: Diagnosis not present

## 2021-07-02 DIAGNOSIS — I5032 Chronic diastolic (congestive) heart failure: Secondary | ICD-10-CM | POA: Diagnosis not present

## 2021-07-02 DIAGNOSIS — I130 Hypertensive heart and chronic kidney disease with heart failure and stage 1 through stage 4 chronic kidney disease, or unspecified chronic kidney disease: Secondary | ICD-10-CM | POA: Diagnosis not present

## 2021-07-02 DIAGNOSIS — D631 Anemia in chronic kidney disease: Secondary | ICD-10-CM | POA: Diagnosis not present

## 2021-07-02 DIAGNOSIS — U071 COVID-19: Secondary | ICD-10-CM | POA: Diagnosis not present

## 2021-07-02 DIAGNOSIS — R4182 Altered mental status, unspecified: Secondary | ICD-10-CM | POA: Diagnosis not present

## 2021-07-02 DIAGNOSIS — I8290 Acute embolism and thrombosis of unspecified vein: Secondary | ICD-10-CM | POA: Diagnosis not present

## 2021-07-02 DIAGNOSIS — D259 Leiomyoma of uterus, unspecified: Secondary | ICD-10-CM | POA: Diagnosis not present

## 2021-07-02 LAB — URINE CULTURE: Culture: 20000 — AB

## 2021-07-02 NOTE — Progress Notes (Signed)
Occupational Therapy Treatment Patient Details Name: Erin Sutton MRN: [id number] DOB: 07-19-1941 Today's Date: 07/02/2021    History of present illness Waynesha Rammel is a 89yoF who comes to [facility] on 06/29/21 with AMS found in bed at home by neighbor. PMH: GAD, depression, HTN, HLD, AF, DVT, PE, GERD, MDD, hypoTSH, dementia, COVID19 infection, OSA, osteoporsis, Lt TKA, incidental remote Lt parietooccipital and thalamic changes on CT. Pt was admitted here in June, [place] with HHPT services due to weakness, then was in [place] to visit family, contracted COVID infection and was admitted there for 7 days beginning 7/21.   OT comments  Upon entering the room, pt seated on recliner chair and starting bath that has been set up for her. Pt needing min guard for standing balance for her to wash buttocks and peri area. Pt able to demonstrate figure four position in standing to wash B LEs and feet. Pt changed socks and hospital gown with set up A to obtain needed items. Pt HR increased to high 120's and RR increased to 40 with standing tasks. Pt taking seated rest break and grooming tasks place on tray table with set up A. Pt remained in recliner chair with all needs within reach. Pt continues to benefit from OT intervention. OT continues to recommend short term rehab to address functional deficits.    Follow Up Recommendations  SNF    Equipment Recommendations  None recommended by OT       Precautions / Restrictions Precautions Precautions: Fall Restrictions Weight Bearing Restrictions: No       Mobility Bed Mobility               General bed mobility comments: seated in recliner chair for bathing tasks    Transfers Overall transfer level: Needs assistance Equipment used: Rolling walker (2 wheeled) Transfers: Sit to/from Stand Sit to Stand: Min guard Stand pivot transfers: Min guard            Balance Overall balance assessment: Needs assistance Sitting-balance support: No  upper extremity supported;Feet supported Sitting balance-Leahy Scale: Good Sitting balance - Comments: good sitting balance reaching within BOS at EOB   Standing balance support: Bilateral upper extremity supported;During functional activity Standing balance-Leahy Scale: Fair Standing balance comment: min guard for standing balance                           ADL either performed or assessed with clinical judgement   ADL Overall ADL's : Needs assistance/impaired     Grooming: Wash/dry hands;Wash/dry face;Oral care;Sitting;Set up   Upper Body Bathing: Set up;Sitting   Lower Body Bathing: Min guard;Sit to/from stand   Upper Body Dressing : Set up;Sitting   Lower Body Dressing: Min guard;Sit to/from stand                       Vision Patient Visual Report: No change from baseline            Cognition Arousal/Alertness: Awake/alert Behavior During Therapy: WFL for tasks assessed/performed                                   General Comments: Pt is very pleasant and cooperative overall. A&O x4.                   Pertinent Vitals/ Pain       Pain  Assessment: No/denies pain         Frequency  Min 2X/week        Progress Toward Goals  OT Goals(current goals can now be found in the care plan section)  Progress towards OT goals: Progressing toward goals  Acute Rehab OT Goals Patient Stated Goal: to be able to go back to my house OT Goal Formulation: With patient Time For Goal Achievement: 07/15/21 Potential to Achieve Goals: [place] Discharge plan remains appropriate;Frequency needs to be updated       AM-PAC OT "6 Clicks" Daily Activity     Outcome Measure   Help from another person eating meals?: None Help from another person taking care of personal grooming?: A Little Help from another person toileting, which includes using toliet, bedpan, or urinal?: A Little Help from another person bathing (including washing,  rinsing, drying)?: A Little Help from another person to put on and taking off regular upper body clothing?: None Help from another person to put on and taking off regular lower body clothing?: A Little 6 Click Score: 20    End of Session    OT Visit Diagnosis: Unsteadiness on feet (R26.81);Muscle weakness (generalized) (M62.81)   Activity Tolerance Patient tolerated treatment well   Patient Left in chair;with call bell/phone within reach;with nursing/sitter in room   Nurse Communication Mobility status        Time: 4239-5320 OT Time Calculation (min): 23 min  Charges: OT General Charges $OT Visit: 1 Visit OT Treatments $Self Care/Home Management : 23-37 mins  Darleen Crocker, MS, OTR/L , CBIS ascom [phone]  07/02/21, 2:42 PM

## 2021-07-02 NOTE — Progress Notes (Signed)
PROGRESS NOTE        PATIENT DETAILS Name: Erin Sutton Age: 80 y.o. Sex: female Date of Birth: 1940-12-09 Admit Date: 06/29/2021 Admitting Physician Lorella Nimrod, MD QIW:[id number], Chrystie Nose, MD  Brief Narrative: Patient is a 80 y.o. 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  She was subsequently admitted to the hospitalist service for further evaluation and treatment.  Significant events: 8/6>> admit for confusion.  Significant studies: 8/6>> CT head: No acute intracranial abnormality. 8/6>> CXR: No pneumonia.  Antimicrobial therapy: None  Microbiology data: 8/6>> blood culture: No growth 8/6>> urine culture: 20,000 colonies of Klebsiella pneumoniae, 30,000 colonies of Proteus mirabilis.  Procedures : None  Consults: None  DVT Prophylaxis : apixaban (ELIQUIS) tablet 5 mg    Subjective: Feels better-inquiring when she can be discharged to a facility.  No chest pain or shortness of breath.   Assessment/Plan: Transient confusion/encephalopathy: Resolved-unclear etiology.  Could have some mild cognitive dysfunction at baseline-did could have worsened due to delirium/lorazepam use.  CT head without any acute abnormalities.  Continue supportive care.  Recent COVID-19 infection: Does not require any further isolation.  She tested positive on July 21-cycle threshold on the PCR on admission was 38.5.  She has no respiratory symptoms.  History of VTE: Continue Eliquis.  PAF: Sinus rhythm on EKG on admission-not on any rate control medications-on Eliquis.  HTN: BP reasonable-continue amlodipine and Aldactone.  Asymptomatic bacteriuria: She has no symptoms of UTI-UA was benign-urine culture as above-likely reflective of colonization rather than a true  infection.  Do not think she needs antimicrobial therapy.  Anxiety/depression: No longer on Celexa-continue trazodone-on as needed lorazepam (long-term med) for anxiety.  Debility/deconditioning/frailty: Suspect some amount of frailty at baseline-probably worsened since she recently had COVID.  Evaluated by PT-felt to require SNF-social worker following.  Obesity: Estimated body mass index is 32.22 kg/m as calculated from the following:   Height as of this encounter: 5' (1.524 m).   Weight as of this encounter: 74.8 kg.   Diet: Diet Order             Diet Heart Room service appropriate? Yes; Fluid consistency: Thin  Diet effective now                    Code Status: Full code   Family Communication: Spoke with daughter-[phone]-on 8/8  Disposition Plan: Status is: Observation  The patient will require care spanning > 2 midnights and should be moved to inpatient because: Unsafe d/c plan  Dispo: The patient is from: Home              Anticipated d/c is to: SNF              Patient currently is not medically stable to d/c.   Difficult to place patient No   Barriers to Discharge: Unsafe disposition-lives alone-appears to have some amount of cognitive dysfunction-PT recommending SNF.  Antimicrobial agents: Anti-infectives (From admission, onward)    Start     Dose/Rate Route Frequency Ordered Stop   06/30/21 1000  remdesivir 100 mg in sodium chloride 0.9 % 100 mL IVPB  Status:  Discontinued       See Hyperspace for full Linked Orders Report.  100 mg 200 mL/hr over 30 Minutes Intravenous Daily 06/29/21 1145 06/29/21 1245   06/29/21 1300  remdesivir 200 mg in sodium chloride 0.9% 250 mL IVPB  Status:  Discontinued       See Hyperspace for full Linked Orders Report.   200 mg 580 mL/hr over 30 Minutes Intravenous Once 06/29/21 1145 06/29/21 1245        Time spent: 15-minutes-Greater than 50% of this time was spent in counseling, explanation of  diagnosis, planning of further management, and coordination of care.  MEDICATIONS: Scheduled Meds:  amLODipine  10 mg Oral Daily   apixaban  5 mg Oral BID   vitamin C  500 mg Oral Daily   atorvastatin  40 mg Oral Daily   levothyroxine  50 mcg Oral QAC breakfast   multivitamin with minerals  1 tablet Oral Daily   pantoprazole  40 mg Oral Daily   polyvinyl alcohol   Both Eyes BID   spironolactone  25 mg Oral Daily   traZODone  50 mg Oral QHS   zinc sulfate  220 mg Oral Daily   Continuous Infusions: PRN Meds:.acetaminophen, guaiFENesin-dextromethorphan, LORazepam, ondansetron **OR** ondansetron (ZOFRAN) IV, polyethylene glycol   PHYSICAL EXAM: Vital signs: Vitals:   07/01/21 2132 07/02/21 0604 07/02/21 0751 07/02/21 1116  BP: 132/82 (!) 150/83 140/81 139/83  Pulse: 78 82 85 (!) 103  Resp: 16 18 16 17   Temp: 98.3 F (36.8 C) 97.8 F (36.6 C) 98.5 F (36.9 C) 98.2 F (36.8 C)  TempSrc: Oral Oral Oral Oral  SpO2: 99% 98% 100% 95%  Weight:      Height:       Filed Weights   06/29/21 1023  Weight: 74.8 kg   Body mass index is 32.22 kg/m.   Gen Exam:Alert awake-not in any distress HEENT:atraumatic, normocephalic Chest: B/L clear to auscultation anteriorly CVS:S1S2 regular Abdomen:soft non tender, non distended Extremities:no edema Neurology: Non focal Skin: no rash   I have personally reviewed following labs and imaging studies  LABORATORY DATA: CBC: Recent Labs  Lab 06/29/21 1003 06/30/21 0646  WBC 6.1 7.7  NEUTROABS 4.5  --   HGB 13.8 12.9  HCT 41.5 37.9  MCV 92.6 91.5  PLT 236 223     Basic Metabolic Panel: Recent Labs  Lab 06/29/21 1003 06/30/21 0646  NA 137 137  K 3.6 3.7  CL 102 101  CO2 23 25  GLUCOSE 106* 82  BUN 9 8  CREATININE 0.82 0.76  CALCIUM 9.7 9.2  MG  --  1.9  PHOS  --  2.9     GFR: Estimated Creatinine Clearance: 51.5 mL/min (by C-G formula based on SCr of 0.76 mg/dL).  Liver Function Tests: Recent Labs  Lab  06/29/21 1003 06/30/21 0646  AST 29 25  ALT 15 14  ALKPHOS 66 62  BILITOT 1.3* 1.3*  PROT 7.6 6.9  ALBUMIN 4.1 3.8    No results for input(s): LIPASE, AMYLASE in the last 168 hours. No results for input(s): AMMONIA in the last 168 hours.  Coagulation Profile: Recent Labs  Lab 06/29/21 1003  INR 1.5*     Cardiac Enzymes: Recent Labs  Lab 06/29/21 1003  CKTOTAL 40     BNP (last 3 results) No results for input(s): PROBNP in the last 8760 hours.  Lipid Profile: No results for input(s): CHOL, HDL, LDLCALC, TRIG, CHOLHDL, LDLDIRECT in the last 72 hours.  Thyroid Function Tests: No results for input(s): TSH, T4TOTAL, FREET4, T3FREE, THYROIDAB in the last  72 hours.   Anemia Panel: No results for input(s): VITAMINB12, FOLATE, FERRITIN, TIBC, IRON, RETICCTPCT in the last 72 hours.   Urine analysis:    Component Value Date/Time   COLORURINE COLORLESS (A) 06/29/2021 1003   APPEARANCEUR CLEAR (A) 06/29/2021 1003   APPEARANCEUR Hazy 07/25/2014 1350   LABSPEC 1.005 06/29/2021 1003   LABSPEC 1.020 07/25/2014 1350   PHURINE 9.0 (H) 06/29/2021 1003   GLUCOSEU NEGATIVE 06/29/2021 1003   GLUCOSEU Negative 07/25/2014 1350   HGBUR SMALL (A) 06/29/2021 1003   BILIRUBINUR NEGATIVE 06/29/2021 1003   BILIRUBINUR Negative 07/25/2014 1350   KETONESUR 5 (A) 06/29/2021 1003   PROTEINUR NEGATIVE 06/29/2021 1003   NITRITE NEGATIVE 06/29/2021 1003   LEUKOCYTESUR NEGATIVE 06/29/2021 1003   LEUKOCYTESUR 2+ 07/25/2014 1350    Sepsis Labs: Lactic Acid, Venous    Component Value Date/Time   LATICACIDVEN 1.2 12/17/2016 1816    MICROBIOLOGY: Recent Results (from the past 240 hour(s))  Resp Panel by RT-PCR (Flu A&B, Covid) Nasopharyngeal Swab     Status: Abnormal   Collection Time: 06/29/21 10:03 AM   Specimen: Nasopharyngeal Swab; Nasopharyngeal(NP) swabs in vial transport medium  Result Value Ref Range Status   SARS Coronavirus 2 by RT PCR POSITIVE (A) NEGATIVE Final     Comment: RESULT CALLED TO, READ BACK BY AND VERIFIED WITH: LINDSAY BLACK ON 06/29/21 AT 1109 QSD (NOTE) SARS-CoV-2 target nucleic acids are DETECTED.  The SARS-CoV-2 RNA is generally detectable in upper respiratory specimens during the acute phase of infection. Positive results are indicative of the presence of the identified virus, but do not rule out bacterial infection or co-infection with other pathogens not detected by the test. Clinical correlation with patient history and other diagnostic information is necessary to determine patient infection status. The expected result is Negative.  Fact Sheet for Patients: EntrepreneurPulse.com.au  Fact Sheet for Healthcare Providers: IncredibleEmployment.be  This test is not yet approved or cleared by the [place] FDA and  has been authorized for detection and/or diagnosis of SARS-CoV-2 by FDA under an Emergency Use Authorization (EUA).  This EUA will remain in effect (meaning this test can  be used) for the duration of  the COVID-19 declaration under Section 564(b)(1) of the Act, 21 U.S.C. section 360bbb-3(b)(1), unless the authorization is terminated or revoked sooner.     Influenza A by PCR NEGATIVE NEGATIVE Final   Influenza B by PCR NEGATIVE NEGATIVE Final    Comment: (NOTE) The Xpert Xpress SARS-CoV-2/FLU/RSV plus assay is intended as an aid in the diagnosis of influenza from Nasopharyngeal swab specimens and should not be used as a sole basis for treatment. Nasal washings and aspirates are unacceptable for Xpert Xpress SARS-CoV-2/FLU/RSV testing.  Fact Sheet for Patients: EntrepreneurPulse.com.au  Fact Sheet for Healthcare Providers: IncredibleEmployment.be  This test is not yet approved or cleared by the [place] FDA and has been authorized for detection and/or diagnosis of SARS-CoV-2 by FDA under an Emergency Use Authorization (EUA). This EUA  will remain in effect (meaning this test can be used) for the duration of the COVID-19 declaration under Section 564(b)(1) of the Act, 21 U.S.C. section 360bbb-3(b)(1), unless the authorization is terminated or revoked.  Performed at [facility], [address], [place], [place] [address]   Urine Culture     Status: Abnormal   Collection Time: 06/29/21 10:03 AM   Specimen: Urine, Random  Result Value Ref Range Status   Specimen Description   Final    URINE, RANDOM Performed at [facility]  [facility], [address], [place], [place] [address]    Special Requests   Final    NONE Performed at [facility], [address] [place], [place] [address]    Culture (A)  Final    20,000 COLONIES/mL KLEBSIELLA PNEUMONIAE 30,000 COLONIES/mL PROTEUS MIRABILIS    Report Status 07/02/2021 FINAL  Final   Organism ID, Bacteria KLEBSIELLA PNEUMONIAE (A)  Final   Organism ID, Bacteria PROTEUS MIRABILIS (A)  Final      Susceptibility   Klebsiella pneumoniae - MIC*    AMPICILLIN >=32 RESISTANT Resistant     CEFAZOLIN <=4 SENSITIVE Sensitive     CEFEPIME <=0.12 SENSITIVE Sensitive     CEFTRIAXONE <=0.25 SENSITIVE Sensitive     CIPROFLOXACIN <=0.25 SENSITIVE Sensitive     GENTAMICIN <=1 SENSITIVE Sensitive     IMIPENEM <=0.25 SENSITIVE Sensitive     NITROFURANTOIN 64 INTERMEDIATE Intermediate     TRIMETH/SULFA <=20 SENSITIVE Sensitive     AMPICILLIN/SULBACTAM 8 SENSITIVE Sensitive     PIP/TAZO <=4 SENSITIVE Sensitive     * 20,000 COLONIES/mL KLEBSIELLA PNEUMONIAE   Proteus mirabilis - MIC*    AMPICILLIN <=2 SENSITIVE Sensitive     CEFAZOLIN 8 SENSITIVE Sensitive     CEFEPIME <=0.12 SENSITIVE Sensitive     CEFTRIAXONE <=0.25 SENSITIVE Sensitive     CIPROFLOXACIN <=0.25 SENSITIVE Sensitive     GENTAMICIN <=1 SENSITIVE Sensitive     IMIPENEM 2 SENSITIVE Sensitive     NITROFURANTOIN 128 RESISTANT Resistant     TRIMETH/SULFA <=20 SENSITIVE Sensitive      AMPICILLIN/SULBACTAM <=2 SENSITIVE Sensitive     PIP/TAZO <=4 SENSITIVE Sensitive     * 30,000 COLONIES/mL PROTEUS MIRABILIS  Culture, blood (Routine X 2) w Reflex to ID Panel     Status: None (Preliminary result)   Collection Time: 06/29/21 12:53 PM   Specimen: BLOOD  Result Value Ref Range Status   Specimen Description BLOOD RIGHT ANTECUBITAL  Final   Special Requests   Final    BOTTLES DRAWN AEROBIC AND ANAEROBIC Blood Culture results may not be optimal due to an excessive volume of blood received in culture bottles   Culture   Final    NO GROWTH 3 DAYS Performed at [facility], [address] [place], [place] [address]    Report Status PENDING  Incomplete  Culture, blood (Routine X 2) w Reflex to ID Panel     Status: None (Preliminary result)   Collection Time: 06/29/21 12:55 PM   Specimen: BLOOD  Result Value Ref Range Status   Specimen Description BLOOD LEFT ANTECUBITAL  Final   Special Requests   Final    BOTTLES DRAWN AEROBIC AND ANAEROBIC Blood Culture adequate volume   Culture   Final    NO GROWTH 3 DAYS Performed at [facility], [address] [place], [place] [address]    Report Status PENDING  Incomplete    RADIOLOGY STUDIES/RESULTS: No results found.   LOS: 0 days   Oren Binet, MD  [facility]    To contact the attending provider between 7A-7P or the covering provider during after hours 7P-7A, please log into the web site [email or web address] and access using universal [place] password for that web site. If you do not have the password, please call the hospital operator.  07/02/2021, 2:26 PM

## 2021-07-03 DIAGNOSIS — F419 Anxiety disorder, unspecified: Secondary | ICD-10-CM | POA: Diagnosis not present

## 2021-07-03 DIAGNOSIS — I8290 Acute embolism and thrombosis of unspecified vein: Secondary | ICD-10-CM | POA: Diagnosis not present

## 2021-07-03 DIAGNOSIS — I10 Essential (primary) hypertension: Secondary | ICD-10-CM | POA: Diagnosis not present

## 2021-07-03 DIAGNOSIS — I480 Paroxysmal atrial fibrillation: Secondary | ICD-10-CM | POA: Diagnosis not present

## 2021-07-03 DIAGNOSIS — U071 COVID-19: Secondary | ICD-10-CM | POA: Diagnosis not present

## 2021-07-03 DIAGNOSIS — R4182 Altered mental status, unspecified: Secondary | ICD-10-CM | POA: Diagnosis not present

## 2021-07-03 LAB — CBC
HCT: 36.6 % (ref 36.0–46.0)
Hemoglobin: 12.1 g/dL (ref 12.0–15.0)
MCH: 30.8 pg (ref 26.0–34.0)
MCHC: 33.1 g/dL (ref 30.0–36.0)
MCV: 93.1 fL (ref 80.0–100.0)
Platelets: 216 10*3/uL (ref 150–400)
RBC: 3.93 MIL/uL (ref 3.87–5.11)
RDW: 15.4 % (ref 11.5–15.5)
WBC: 8.6 10*3/uL (ref 4.0–10.5)
nRBC: 0 % (ref 0.0–0.2)

## 2021-07-03 MED ORDER — LORAZEPAM 0.5 MG PO TABS: 0.5000 mg | ORAL_TABLET | Freq: Three times a day (TID) | ORAL | 0 refills | Status: DC | PRN

## 2021-07-03 NOTE — Progress Notes (Signed)
Physical Therapy Treatment Patient Details Name: Erin Sutton MRN: [id number] DOB: 1941-05-07 Today's Date: 07/03/2021    History of Present Illness Erin Sutton is a 73yoF who comes [facility] on 06/29/21 with AMS found in bed at home by neighbor. PMH: GAD, depression, HTN, HLD, AF, DVT, PE, GERD, MDD, hypoTSH, dementia, COVID19 infection, OSA, osteoporsis, Lt TKA, incidental remote Lt parietooccipital and thalamic changes on CT. Pt was admitted here in June, [place] with HHPT services due to weakness, then was in [place] to visit family, contracted COVID infection and was admitted there for 7 days beginning 7/21.    PT Comments    Pt received seated upright in bed agreeable and motivated to participate with PT. On RA and HR trending in mid 90's. Pt required supervision with HOB elevated to sit EOB and supervision to stand with RW with safe hand placement. Prior to standing pt educated and instructed on Seated LE therex to assist in minor dizziness and Le strengthening with good understanding of form/technique. Pt progressing walking tolerance to 48' with x2 standing rest breaks due to elevated HR into 120's with telemetry reporting ST segment elevations.  Pt returned to recliner and educated on continued need for STR at this time to optimize LE strength prior to transitioning to home due to no assist and living alone. D/c recs remain appropriate.   Follow Up Recommendations  Supervision for mobility/OOB;SNF     Equipment Recommendations  None recommended by PT    Recommendations for Other Services       Precautions / Restrictions Precautions Precautions: Fall Restrictions Weight Bearing Restrictions: No    Mobility  Bed Mobility Overal bed mobility: Needs Assistance Bed Mobility: Supine to Sit     Supine to sit: Supervision;HOB elevated          Transfers Overall transfer level: Needs assistance Equipment used: Rolling walker (2 wheeled) Transfers: Sit to/from  Stand Sit to Stand: Supervision         General transfer comment: Only requiring supervision for standing today.  Ambulation/Gait   Gait Distance (Feet): 48 Feet Assistive device: Rolling walker (2 wheeled) Gait Pattern/deviations: Step-to pattern;Decreased step length - right;Decreased step length - left;Narrow base of support;Trunk flexed     General Gait Details: x2 standing rest breaks due to ST segment elevations. HR trending up to 120's with household distances.   Stairs             Wheelchair Mobility    Modified Rankin (Stroke Patients Only)       Balance Overall balance assessment: Needs assistance Sitting-balance support: No upper extremity supported;Feet supported Sitting balance-Leahy Scale: Good     Standing balance support: Bilateral upper extremity supported;During functional activity Standing balance-Leahy Scale: Fair Standing balance comment: min guard for standing balance                            Cognition Arousal/Alertness: Awake/alert Behavior During Therapy: WFL for tasks assessed/performed Overall Cognitive Status: Within Functional Limits for tasks assessed                                 General Comments: Willing to participate. Very motivated to get better.      Exercises General Exercises - Lower Extremity Ankle Circles/Pumps: AROM;Supine;Both;20 reps Long Arc Quad: AROM;Seated;Both;20 reps Hip Flexion/Marching: AROM;Strengthening;Both;20 reps    General Comments  Pertinent Vitals/Pain Pain Assessment: No/denies pain    Home Living                      Prior Function            PT Goals (current goals can now be found in the care plan section) Acute Rehab PT Goals Patient Stated Goal: to be able to go back to my house PT Goal Formulation: With patient Time For Goal Achievement: 07/14/21 Potential to Achieve Goals: Fair Progress towards PT goals: Progressing toward  goals    Frequency    Min 2X/week      PT Plan Current plan remains appropriate    Co-evaluation              AM-PAC PT "6 Clicks" Mobility   Outcome Measure  Help needed turning from your back to your side while in a flat bed without using bedrails?: A Lot Help needed moving from lying on your back to sitting on the side of a flat bed without using bedrails?: A Lot Help needed moving to and from a bed to a chair (including a wheelchair)?: A Little Help needed standing up from a chair using your arms (e.g., wheelchair or bedside chair)?: A Little Help needed to walk in hospital room?: A Little Help needed climbing 3-5 steps with a railing? : A Lot 6 Click Score: 15    End of Session Equipment Utilized During Treatment: Gait belt Activity Tolerance: Patient tolerated treatment well;Patient limited by fatigue Patient left: in chair;with call bell/phone within reach Nurse Communication: Mobility status PT Visit Diagnosis: Unsteadiness on feet (R26.81);Other abnormalities of gait and mobility (R26.89);Difficulty in walking, not elsewhere classified (R26.2);Muscle weakness (generalized) (M62.81)     Time: 0165-5374 PT Time Calculation (min) (ACUTE ONLY): 23 min  Charges:  $Gait Training: 8-22 mins $Therapeutic Exercise: 8-22 mins                    Cleaven Demario M. Fairly IV, PT, DPT Physical Therapist- [facility]  07/03/2021, 10:58 AM

## 2021-07-03 NOTE — TOC Progression Note (Signed)
Transition of Care [facility]) - Progression Note    Patient Details  Name: LEROY TRIM MRN: [id number] Date of Birth: [birth year]  Transition of Care [facility]) CM/SW [place], RN Phone Number: 07/03/2021, 3:43 PM  Clinical Narrative:   Patient chose [facility], RNCM attempted to call x 6 today.  RNCM notified care RN of delay.  Krisit from [facility] stated that facility does not have auth.  Facility happy to accept when Josem Kaufmann is issued, they will continue to check.    Expected Discharge Plan: [place] Barriers to Discharge: Continued Medical Work up  Expected Discharge Plan and Services Expected Discharge Plan: [place] arrangements for the past 2 months: Single Family Home Expected Discharge Date: 07/03/21                                     Social Determinants of Health (SDOH) Interventions    Readmission Risk Interventions No flowsheet data found.

## 2021-07-03 NOTE — Discharge Summary (Addendum)
PATIENT DETAILS Name: Erin Sutton Age: 80 y.o. Sex: female Date of Birth: 07-28-1941 MRN: [id number]. Admitting Physician: Lorella Nimrod, MD [id number], Chrystie Nose, MD  Admit Date: 06/29/2021 Discharge date: 07/04/2021  Recommendations for Outpatient Follow-up:  Follow up with PCP in 1-2 weeks Please obtain CMP/CBC in one week  Admitted From:  Home  Disposition: SNF   Home Health: No  Equipment/Devices: None  Discharge Condition: Stable  CODE STATUS: FULL CODE  Diet recommendation:  Diet Order             Diet - low sodium heart healthy           Diet Heart Room service appropriate? Yes; Fluid consistency: Thin  Diet effective now                    Brief Narrative: Patient is a 80 y.o. 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  She was subsequently admitted to the hospitalist service for further evaluation and treatment.   Significant events: 8/6>> admit for confusion.   Significant studies: 8/6>> CT head: No acute intracranial abnormality. 8/6>> CXR: No pneumonia.   Antimicrobial therapy: None   Microbiology data: 8/6>> blood culture: No growth 8/6>> urine culture: 20,000 colonies of Klebsiella pneumoniae, 30,000 colonies of Proteus mirabilis.   Procedures : None   Consults: None  Brief Hospital Course: Transient confusion/encephalopathy: Resolved-unclear etiology.  Could have some mild cognitive dysfunction at baseline-did could have worsened due to delirium/lorazepam use.  CT head without any acute abnormalities.  Continue supportive care.   Recent COVID-19 infection: Does not require any further isolation.  She tested positive on July 21-cycle threshold on the PCR on admission was 38.5.  She has no respiratory symptoms.   History of VTE:  Continue Eliquis.   PAF: Sinus rhythm on EKG on admission-not on any rate control medications-on Eliquis.   HTN: BP reasonable-continue amlodipine and Aldactone.   Asymptomatic bacteriuria: She has no symptoms of UTI-UA was benign-urine culture as above-likely reflective of colonization rather than a true infection.  Do not think she needs antimicrobial therapy.   Anxiety/depression: No longer on Celexa-continue trazodone-on as needed lorazepam (long-term med) for anxiety.   Debility/deconditioning/frailty: Suspect some amount of frailty at baseline-probably worsened since she recently had COVID.  Evaluated by PT-felt to require SNF-social worker following for SNF placement   Obesity: Estimated body mass index is 32.22 kg/m as calculated from the following:   Height as of this encounter: 5' (1.524 m).   Weight as of this encounter: 74.8 kg.    Procedures None  Discharge Diagnoses:  Active Problems:   AMS (altered mental status)   Discharge Instructions:  Activity:  As tolerated with Full fall precautions use walker/cane & assistance as needed  Discharge Instructions     Call MD for:  extreme fatigue   Complete by: As directed    Call MD for:  persistant dizziness or light-headedness   Complete by: As directed    Diet - low sodium heart healthy   Complete by: As directed    Discharge instructions   Complete by: As directed    Follow with Primary MD  Baxter Hire, MD in 1-2 weeks  Please get a complete blood count and chemistry panel checked by your Primary MD at your next visit, and again as instructed by your Primary  MD.  Get Medicines reviewed and adjusted: Please take all your medications with you for your next visit with your Primary MD  Laboratory/radiological data: Please request your Primary MD to go over all hospital tests and procedure/radiological results at the follow up, please ask your Primary MD to get all Hospital records sent to his/her  office.  In some cases, they will be blood work, cultures and biopsy results pending at the time of your discharge. Please request that your primary care M.D. follows up on these results.  Also Note the following: If you experience worsening of your admission symptoms, develop shortness of breath, life threatening emergency, suicidal or homicidal thoughts you must seek medical attention immediately by calling 911 or calling your MD immediately  if symptoms less severe.  You must read complete instructions/literature along with all the possible adverse reactions/side effects for all the Medicines you take and that have been prescribed to you. Take any new Medicines after you have completely understood and accpet all the possible adverse reactions/side effects.   Do not drive when taking Pain medications or sleeping medications (Benzodaizepines)  Do not take more than prescribed Pain, Sleep and Anxiety Medications. It is not advisable to combine anxiety,sleep and pain medications without talking with your primary care practitioner  Special Instructions: If you have smoked or chewed Tobacco  in the last 2 yrs please stop smoking, stop any regular Alcohol  and or any Recreational drug use.  Wear Seat belts while driving.  Please note: You were cared for by a hospitalist during your hospital stay. Once you are discharged, your primary care physician will handle any further medical issues. Please note that NO REFILLS for any discharge medications will be authorized once you are discharged, as it is imperative that you return to your primary care physician (or establish a relationship with a primary care physician if you do not have one) for your post hospital discharge needs so that they can reassess your need for medications and monitor your lab values.   Increase activity slowly   Complete by: As directed       Allergies as of 07/04/2021       Reactions   Ace Inhibitors Cough   Levofloxacin     Penicillin G Other (See Comments)   Tape Other (See Comments)   Other reaction(s): Other (See Comments)   Amoxicillin Rash   Has patient had a PCN reaction causing immediate rash, facial/tongue/throat swelling, SOB or lightheadedness with hypotension: No Has patient had a PCN reaction causing severe rash involving mucus membranes or skin necrosis: No Has patient had a PCN reaction that required hospitalization: No Has patient had a PCN reaction occurring within the last 10 years: No If all of the above answers are "NO", then may proceed with Cephalosporin use.   Benicar [olmesartan] Rash   Itching rash   Codeine Rash   Codeine Sulfate Rash   Levaquin [levofloxacin In D5w] Rash   Alters mental status   Penicillin V Potassium Rash   Has patient had a PCN reaction causing immediate rash, facial/tongue/throat swelling, SOB or lightheadedness with hypotension: No Has patient had a PCN reaction causing severe rash involving mucus membranes or skin necrosis: No Has patient had a PCN reaction that required hospitalization: No Has patient had a PCN reaction occurring within the last 10 years: No If all of the above answers are "NO", then may proceed with Cephalosporin use.   Sulfa Antibiotics Rash  Medication List     STOP taking these medications    citalopram 20 MG tablet Commonly known as: CELEXA   potassium chloride SA 20 MEQ tablet Commonly known as: KLOR-CON       TAKE these medications    acetaminophen 500 MG tablet Commonly known as: TYLENOL Take 500 mg by mouth daily as needed.   amLODipine 10 MG tablet Commonly known as: NORVASC Take 10 mg by mouth daily.   apixaban 5 MG Tabs tablet Commonly known as: ELIQUIS Take 5 mg by mouth in the morning and at bedtime. What changed: Another medication with the same name was removed. Continue taking this medication, and follow the directions you see here.   atorvastatin 40 MG tablet Commonly known as:  LIPITOR Take 1 tablet by mouth daily.   cyanocobalamin 1000 MCG/ML injection Commonly known as: (VITAMIN B-12) Inject 1,000 mcg into the muscle See admin instructions. Inject 1068mcg once weekly for 4 weeks then recheck by Dr Wynetta Emery   fluticasone 50 MCG/ACT nasal spray Commonly known as: FLONASE Place 1 spray into both nostrils daily.   levothyroxine 50 MCG tablet Commonly known as: SYNTHROID Take 50 mcg by mouth daily before breakfast.   LORazepam 0.5 MG tablet Commonly known as: ATIVAN Take 1 tablet (0.5 mg total) by mouth 3 (three) times daily as needed.   ondansetron 8 MG disintegrating tablet Commonly known as: Zofran ODT Take 1 tablet (8 mg total) by mouth every 8 (eight) hours as needed for nausea or vomiting.   pantoprazole 40 MG tablet Commonly known as: PROTONIX Take 40 mg by mouth daily.   polyethylene glycol powder 17 GM/SCOOP powder Commonly known as: GLYCOLAX/MIRALAX Take 17 g by mouth daily.   senna-docusate 8.6-50 MG tablet Commonly known as: Senokot-S Take 2 tablets by mouth 2 (two) times daily as needed for mild constipation.   spironolactone 25 MG tablet Commonly known as: ALDACTONE Take 25 mg by mouth daily.   SYSTANE OP Place 1 drop into both eyes 2 (two) times daily.   traZODone 50 MG tablet Commonly known as: DESYREL Take 50 mg by mouth at bedtime.        Follow-up Information     Baxter Hire, MD. Schedule an appointment as soon as possible for a visit in 1 week(s).   Specialty: Internal Medicine Contact information: [place] [address] [phone]                Allergies  Allergen Reactions   Ace Inhibitors Cough   Levofloxacin    Penicillin G Other (See Comments)   Tape Other (See Comments)    Other reaction(s): Other (See Comments)   Amoxicillin Rash    Has patient had a PCN reaction causing immediate rash, facial/tongue/throat swelling, SOB or lightheadedness with hypotension: No Has  patient had a PCN reaction causing severe rash involving mucus membranes or skin necrosis: No Has patient had a PCN reaction that required hospitalization: No Has patient had a PCN reaction occurring within the last 10 years: No If all of the above answers are "NO", then may proceed with Cephalosporin use.    Benicar [Olmesartan] Rash    Itching rash   Codeine Rash   Codeine Sulfate Rash   Levaquin [Levofloxacin In D5w] Rash    Alters mental status   Penicillin V Potassium Rash    Has patient had a PCN reaction causing immediate rash, facial/tongue/throat swelling, SOB or lightheadedness with hypotension: No Has patient had a PCN  reaction causing severe rash involving mucus membranes or skin necrosis: No Has patient had a PCN reaction that required hospitalization: No Has patient had a PCN reaction occurring within the last 10 years: No If all of the above answers are "NO", then may proceed with Cephalosporin use.    Sulfa Antibiotics Rash      Consultations: None   Other Procedures/Studies: CT HEAD WO CONTRAST (5MM)  Result Date: 06/29/2021 CLINICAL DATA:  80 year old female with altered mental status. EXAM: CT HEAD WITHOUT CONTRAST TECHNIQUE: Contiguous axial images were obtained from the base of the skull through the vertex without intravenous contrast. COMPARISON:  05/12/2021 CT and prior studies FINDINGS: Brain: No evidence of acute infarction, hemorrhage, hydrocephalus, extra-axial collection or mass lesion/mass effect. Mild chronic small-vessel white matter ischemic changes. 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 Vascular: Carotid atherosclerotic calcifications are noted. Skull: Normal. Negative for fracture or focal lesion. Sinuses/Orbits: No acute finding. Other: None. IMPRESSION: 1. No evidence of acute intracranial abnormality. 2. 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 Electronically Signed   By: Margarette Canada M.D.   On: 06/29/2021 10:49   DG Chest Portable 1 View  Result Date: 06/29/2021 CLINICAL DATA:  Rule out pneumonia.  Productive cough. EXAM: PORTABLE CHEST 1 VIEW COMPARISON:  05/12/2021 FINDINGS: Heart size is normal. Lungs are free of focal consolidations and pleural effusions. There is mild perihilar peribronchial thickening. Degenerative changes are seen in the thoracic spine. IMPRESSION: Mild bronchitic changes. Electronically Signed   By: Nolon Nations M.D.   On: 06/29/2021 10:26     TODAY-DAY OF DISCHARGE:  Subjective:   Codi Folkerts today has no headache,no chest abdominal pain,no new weakness tingling or numbness, feels much better wants to go home today.   Objective:   Blood pressure 127/62, pulse 75, temperature 98.1 F (36.7 C), temperature source Oral, resp. rate 18, height 5' (1.524 m), weight 74.8 kg, SpO2 95 %.  Intake/Output Summary (Last 24 hours) at 07/04/2021 1121 Last data filed at 07/04/2021 0920 Gross per 24 hour  Intake 360 ml  Output 450 ml  Net -90 ml   Filed Weights   06/29/21 1023  Weight: 74.8 kg    Exam: Awake Alert, Oriented *3, No new F.N deficits, Normal affect Halls.AT,PERRAL Supple Neck,No JVD, No cervical lymphadenopathy appriciated.  Symmetrical Chest wall movement, Good air movement bilaterally, CTAB RRR,No Gallops,Rubs or new Murmurs, No Parasternal Heave +ve B.Sounds, Abd Soft, Non tender, No organomegaly appriciated, No rebound -guarding or rigidity. No Cyanosis, Clubbing or edema, No new Rash or bruise   PERTINENT RADIOLOGIC STUDIES: No results found.   PERTINENT LAB RESULTS: CBC: Recent Labs    07/03/21 0348  WBC 8.6  HGB 12.1  HCT 36.6  PLT 216   CMET CMP     Component Value Date/Time   NA 137 06/30/2021 0646   NA 138 08/08/2014 0703   K 3.7 06/30/2021 0646   K 3.4 (L) 08/08/2014 0703   CL 101 06/30/2021 0646   CL 102 08/08/2014 0703   CO2 25  06/30/2021 0646   CO2 30 08/08/2014 0703   GLUCOSE 82 06/30/2021 0646   GLUCOSE 103 (H) 08/08/2014 0703   BUN 8 06/30/2021 0646   BUN 7 08/08/2014 0703   CREATININE 0.76 06/30/2021 0646   CREATININE 0.88 08/08/2014 0703   CALCIUM 9.2 06/30/2021 0646   CALCIUM 8.4 (L) 08/08/2014 0703   PROT 6.9 06/30/2021 0646  PROT 7.7 12/18/2012 1945   ALBUMIN 3.8 06/30/2021 0646   ALBUMIN 4.1 12/18/2012 1945   AST 25 06/30/2021 0646   AST 32 12/18/2012 1945   ALT 14 06/30/2021 0646   ALT 33 12/18/2012 1945   ALKPHOS 62 06/30/2021 0646   ALKPHOS 92 12/18/2012 1945   BILITOT 1.3 (H) 06/30/2021 0646   BILITOT 0.7 12/18/2012 1945   GFRNONAA >60 06/30/2021 0646   GFRNONAA >60 08/08/2014 0703   GFRAA >60 04/15/2020 0703   GFRAA >60 08/08/2014 0703    GFR Estimated Creatinine Clearance: 51.5 mL/min (by C-G formula based on SCr of 0.76 mg/dL). No results for input(s): LIPASE, AMYLASE in the last 72 hours. No results for input(s): CKTOTAL, CKMB, CKMBINDEX, TROPONINI in the last 72 hours. Invalid input(s): POCBNP No results for input(s): DDIMER in the last 72 hours. No results for input(s): HGBA1C in the last 72 hours. No results for input(s): CHOL, HDL, LDLCALC, TRIG, CHOLHDL, LDLDIRECT in the last 72 hours. No results for input(s): TSH, T4TOTAL, T3FREE, THYROIDAB in the last 72 hours.  Invalid input(s): FREET3 No results for input(s): VITAMINB12, FOLATE, FERRITIN, TIBC, IRON, RETICCTPCT in the last 72 hours. Coags: No results for input(s): INR in the last 72 hours.  Invalid input(s): PT Microbiology: Recent Results (from the past 240 hour(s))  Resp Panel by RT-PCR (Flu A&B, Covid) Nasopharyngeal Swab     Status: Abnormal   Collection Time: 06/29/21 10:03 AM   Specimen: Nasopharyngeal Swab; Nasopharyngeal(NP) swabs in vial transport medium  Result Value Ref Range Status   SARS Coronavirus 2 by RT PCR POSITIVE (A) NEGATIVE Final    Comment: RESULT CALLED TO, READ BACK BY AND VERIFIED  WITH: LINDSAY BLACK ON 06/29/21 AT 1109 QSD (NOTE) SARS-CoV-2 target nucleic acids are DETECTED.  The SARS-CoV-2 RNA is generally detectable in upper respiratory specimens during the acute phase of infection. Positive results are indicative of the presence of the identified virus, but do not rule out bacterial infection or co-infection with other pathogens not detected by the test. Clinical correlation with patient history and other diagnostic information is necessary to determine patient infection status. The expected result is Negative.  Fact Sheet for Patients: EntrepreneurPulse.com.au  Fact Sheet for Healthcare Providers: IncredibleEmployment.be  This test is not yet approved or cleared by the [place] FDA and  has been authorized for detection and/or diagnosis of SARS-CoV-2 by FDA under an Emergency Use Authorization (EUA).  This EUA will remain in effect (meaning this test can  be used) for the duration of  the COVID-19 declaration under Section 564(b)(1) of the Act, 21 U.S.C. section 360bbb-3(b)(1), unless the authorization is terminated or revoked sooner.     Influenza A by PCR NEGATIVE NEGATIVE Final   Influenza B by PCR NEGATIVE NEGATIVE Final    Comment: (NOTE) The Xpert Xpress SARS-CoV-2/FLU/RSV plus assay is intended as an aid in the diagnosis of influenza from Nasopharyngeal swab specimens and should not be used as a sole basis for treatment. Nasal washings and aspirates are unacceptable for Xpert Xpress SARS-CoV-2/FLU/RSV testing.  Fact Sheet for Patients: EntrepreneurPulse.com.au  Fact Sheet for Healthcare Providers: IncredibleEmployment.be  This test is not yet approved or cleared by the [place] FDA and has been authorized for detection and/or diagnosis of SARS-CoV-2 by FDA under an Emergency Use Authorization (EUA). This EUA will remain in effect (meaning this test can be  used) for the duration of the COVID-19 declaration under Section 564(b)(1) of the Act, 21 U.S.C. section 360bbb-3(b)(1), unless the  authorization is terminated or revoked.  Performed at [facility], [address] [place], [place] [address]   Urine Culture     Status: Abnormal   Collection Time: 06/29/21 10:03 AM   Specimen: Urine, Random  Result Value Ref Range Status   Specimen Description   Final    URINE, RANDOM Performed at [facility], [address] [place], [place] [address]    Special Requests   Final    NONE Performed at [facility], [address] [place], [place] [address]    Culture (A)  Final    20,000 COLONIES/mL KLEBSIELLA PNEUMONIAE 30,000 COLONIES/mL PROTEUS MIRABILIS    Report Status 07/02/2021 FINAL  Final   Organism ID, Bacteria KLEBSIELLA PNEUMONIAE (A)  Final   Organism ID, Bacteria PROTEUS MIRABILIS (A)  Final      Susceptibility   Klebsiella pneumoniae - MIC*    AMPICILLIN >=32 RESISTANT Resistant     CEFAZOLIN <=4 SENSITIVE Sensitive     CEFEPIME <=0.12 SENSITIVE Sensitive     CEFTRIAXONE <=0.25 SENSITIVE Sensitive     CIPROFLOXACIN <=0.25 SENSITIVE Sensitive     GENTAMICIN <=1 SENSITIVE Sensitive     IMIPENEM <=0.25 SENSITIVE Sensitive     NITROFURANTOIN 64 INTERMEDIATE Intermediate     TRIMETH/SULFA <=20 SENSITIVE Sensitive     AMPICILLIN/SULBACTAM 8 SENSITIVE Sensitive     PIP/TAZO <=4 SENSITIVE Sensitive     * 20,000 COLONIES/mL KLEBSIELLA PNEUMONIAE   Proteus mirabilis - MIC*    AMPICILLIN <=2 SENSITIVE Sensitive     CEFAZOLIN 8 SENSITIVE Sensitive     CEFEPIME <=0.12 SENSITIVE Sensitive     CEFTRIAXONE <=0.25 SENSITIVE Sensitive     CIPROFLOXACIN <=0.25 SENSITIVE Sensitive     GENTAMICIN <=1 SENSITIVE Sensitive     IMIPENEM 2 SENSITIVE Sensitive     NITROFURANTOIN 128 RESISTANT Resistant     TRIMETH/SULFA <=20 SENSITIVE Sensitive     AMPICILLIN/SULBACTAM <=2 SENSITIVE Sensitive     PIP/TAZO <=4  SENSITIVE Sensitive     * 30,000 COLONIES/mL PROTEUS MIRABILIS  Culture, blood (Routine X 2) w Reflex to ID Panel     Status: None   Collection Time: 06/29/21 12:53 PM   Specimen: BLOOD  Result Value Ref Range Status   Specimen Description BLOOD RIGHT ANTECUBITAL  Final   Special Requests   Final    BOTTLES DRAWN AEROBIC AND ANAEROBIC Blood Culture results may not be optimal due to an excessive volume of blood received in culture bottles   Culture   Final    NO GROWTH 5 DAYS Performed at [facility], [address], [place], [place] [address]    Report Status 07/04/2021 FINAL  Final  Culture, blood (Routine X 2) w Reflex to ID Panel     Status: None   Collection Time: 06/29/21 12:55 PM   Specimen: BLOOD  Result Value Ref Range Status   Specimen Description BLOOD LEFT ANTECUBITAL  Final   Special Requests   Final    BOTTLES DRAWN AEROBIC AND ANAEROBIC Blood Culture adequate volume   Culture   Final    NO GROWTH 5 DAYS Performed at [facility], [address] [place],  [address]    Report Status 07/04/2021 FINAL  Final    FURTHER DISCHARGE INSTRUCTIONS:  Get Medicines reviewed and adjusted: Please take all your medications with you for your next visit with your Primary MD  Laboratory/radiological data: Please request your Primary MD to go over all hospital tests and  procedure/radiological results at the follow up, please ask your Primary MD to get all Hospital records sent to his/her office.  In some cases, they will be blood work, cultures and biopsy results pending at the time of your discharge. Please request that your primary care M.D. goes through all the records of your hospital data and follows up on these results.  Also Note the following: If you experience worsening of your admission symptoms, develop shortness of breath, life threatening emergency, suicidal or homicidal thoughts you must seek medical attention immediately by calling 911 or  calling your MD immediately  if symptoms less severe.  You must read complete instructions/literature along with all the possible adverse reactions/side effects for all the Medicines you take and that have been prescribed to you. Take any new Medicines after you have completely understood and accpet all the possible adverse reactions/side effects.   Do not drive when taking Pain medications or sleeping medications (Benzodaizepines)  Do not take more than prescribed Pain, Sleep and Anxiety Medications. It is not advisable to combine anxiety,sleep and pain medications without talking with your primary care practitioner  Special Instructions: If you have smoked or chewed Tobacco  in the last 2 yrs please stop smoking, stop any regular Alcohol  and or any Recreational drug use.  Wear Seat belts while driving.  Please note: You were cared for by a hospitalist during your hospital stay. Once you are discharged, your primary care physician will handle any further medical issues. Please note that NO REFILLS for any discharge medications will be authorized once you are discharged, as it is imperative that you return to your primary care physician (or establish a relationship with a primary care physician if you do not have one) for your post hospital discharge needs so that they can reassess your need for medications and monitor your lab values.  Total Time spent coordinating discharge including counseling, education and face to face time equals 35 minutes.  SignedOren Binet 07/04/2021 11:21 AM

## 2021-07-04 DIAGNOSIS — K219 Gastro-esophageal reflux disease without esophagitis: Secondary | ICD-10-CM | POA: Diagnosis not present

## 2021-07-04 DIAGNOSIS — I10 Essential (primary) hypertension: Secondary | ICD-10-CM | POA: Diagnosis not present

## 2021-07-04 DIAGNOSIS — E539 Vitamin B deficiency, unspecified: Secondary | ICD-10-CM | POA: Diagnosis not present

## 2021-07-04 DIAGNOSIS — U071 COVID-19: Secondary | ICD-10-CM | POA: Diagnosis not present

## 2021-07-04 DIAGNOSIS — I129 Hypertensive chronic kidney disease with stage 1 through stage 4 chronic kidney disease, or unspecified chronic kidney disease: Secondary | ICD-10-CM | POA: Diagnosis not present

## 2021-07-04 DIAGNOSIS — R4182 Altered mental status, unspecified: Secondary | ICD-10-CM | POA: Diagnosis not present

## 2021-07-04 DIAGNOSIS — N184 Chronic kidney disease, stage 4 (severe): Secondary | ICD-10-CM | POA: Diagnosis not present

## 2021-07-04 DIAGNOSIS — R5381 Other malaise: Secondary | ICD-10-CM | POA: Diagnosis not present

## 2021-07-04 DIAGNOSIS — I480 Paroxysmal atrial fibrillation: Secondary | ICD-10-CM | POA: Diagnosis not present

## 2021-07-04 DIAGNOSIS — I82401 Acute embolism and thrombosis of unspecified deep veins of right lower extremity: Secondary | ICD-10-CM | POA: Diagnosis not present

## 2021-07-04 DIAGNOSIS — F32A Depression, unspecified: Secondary | ICD-10-CM | POA: Diagnosis not present

## 2021-07-04 DIAGNOSIS — F0390 Unspecified dementia without behavioral disturbance: Secondary | ICD-10-CM | POA: Diagnosis not present

## 2021-07-04 DIAGNOSIS — J1281 Pneumonia due to SARS-associated coronavirus: Secondary | ICD-10-CM | POA: Diagnosis not present

## 2021-07-04 DIAGNOSIS — Z79899 Other long term (current) drug therapy: Secondary | ICD-10-CM | POA: Diagnosis not present

## 2021-07-04 DIAGNOSIS — Z7901 Long term (current) use of anticoagulants: Secondary | ICD-10-CM | POA: Diagnosis not present

## 2021-07-04 DIAGNOSIS — R279 Unspecified lack of coordination: Secondary | ICD-10-CM | POA: Diagnosis not present

## 2021-07-04 DIAGNOSIS — M6281 Muscle weakness (generalized): Secondary | ICD-10-CM | POA: Diagnosis not present

## 2021-07-04 DIAGNOSIS — F419 Anxiety disorder, unspecified: Secondary | ICD-10-CM | POA: Diagnosis not present

## 2021-07-04 DIAGNOSIS — Z96652 Presence of left artificial knee joint: Secondary | ICD-10-CM | POA: Diagnosis not present

## 2021-07-04 DIAGNOSIS — Z8616 Personal history of COVID-19: Secondary | ICD-10-CM | POA: Diagnosis not present

## 2021-07-04 DIAGNOSIS — E039 Hypothyroidism, unspecified: Secondary | ICD-10-CM | POA: Diagnosis not present

## 2021-07-04 DIAGNOSIS — Z86718 Personal history of other venous thrombosis and embolism: Secondary | ICD-10-CM | POA: Diagnosis not present

## 2021-07-04 LAB — CULTURE, BLOOD (ROUTINE X 2)
Culture: NO GROWTH
Culture: NO GROWTH
Special Requests: ADEQUATE

## 2021-07-04 NOTE — Progress Notes (Signed)
Occupational Therapy Treatment Patient Details Name: Erin Sutton MRN: [id number] DOB: 01-Aug-1941 Today's Date: 07/04/2021    History of present illness Erin Sutton is a 73yoF who comes to [facility] on 06/29/21 with AMS found in bed at home by neighbor. PMH: GAD, depression, HTN, HLD, AF, DVT, PE, GERD, MDD, hypoTSH, dementia, COVID19 infection, OSA, osteoporsis, Lt TKA, incidental remote Lt parietooccipital and thalamic changes on CT. Pt was admitted here in June, [place] with HHPT services due to weakness, then was in [place] to visit family, contracted COVID infection and was admitted there for 7 days beginning 7/21.   OT comments  Pt seen for OT tx this date to f/u re: safety with ADLs/ADL mobility. Pt presents this date with her baseline cognition (A&Ox4) and while she is somewhat drowsy at the start of the session, she is agreeable to OOB Activity. Pt requires CGA on initial CTS with RW, but demos good steadiness and is able to complete subsequent trials with SUPV. Pt completes fxl mobility to/from restroom with CGA with RW and demos ability to use grab bar for commode transfer with SUPV. Pt tolerates ~6-7 minute stand sink-side to complete hand hygiene as well as other g/h tasks with SETUP. Pt tolerates fxl mobility with RW back to bed, some cues for pacing throughout (esp with regards to some light dizziness with positional changes). Pt left in bed with all needs met and in reach. Will continue to follow as pt continues to progress with her ADL skills through OT treatment. Continue to anticipate that pt will require STR f/u OT services as she is still somewhat unsteady and weak versus her baseline and has limited local support.     Follow Up Recommendations  SNF    Equipment Recommendations  None recommended by OT    Recommendations for Other Services      Precautions / Restrictions Precautions Precautions: Fall Restrictions Weight Bearing Restrictions: No       Mobility Bed  Mobility Overal bed mobility: Needs Assistance Bed Mobility: Supine to Sit;Sit to Supine     Supine to sit: Supervision;HOB elevated Sit to supine: Supervision   General bed mobility comments: increased time, use of rails, HOB elevated    Transfers Overall transfer level: Needs assistance Equipment used: Rolling walker (2 wheeled) Transfers: Sit to/from Stand Sit to Stand: Supervision Stand pivot transfers: Min guard       General transfer comment: cues for pacing, esp in regards to some slight dizziness wtih positional changes upon first getting OOB in the AM.    Balance Overall balance assessment: Needs assistance Sitting-balance support: No upper extremity supported;Feet supported Sitting balance-Leahy Scale: Good       Standing balance-Leahy Scale: Fair Standing balance comment: UE support, SUPV to CGA                           ADL either performed or assessed with clinical judgement   ADL Overall ADL's : Needs assistance/impaired     Grooming: Wash/dry hands;Wash/dry face;Oral care;Supervision/safety;Min guard;Standing Grooming Details (indicate cue type and reason): sink-side with RW for UE support                 Toilet Transfer: Supervision/safety;Grab bars;Regular Materials engineer Details (indicate cue type and reason): CGA with RW for fxl mobility to restroom, SUPV with use of grab bar for actual commode transfer Toileting- Clothing Manipulation and Hygiene: Supervision/safety;Min guard;Sit to/from stand Toileting - Water quality scientist Details (indicate  cue type and reason): grab bar     Functional mobility during ADLs: Min guard;Rolling walker (to/from restroom)       Vision Patient Visual Report: No change from baseline     Perception     Praxis      Cognition Arousal/Alertness: Awake/alert Behavior During Therapy: WFL for tasks assessed/performed Overall Cognitive Status: Within Functional Limits for tasks  assessed                                 General Comments: Willing to participate. Very motivated to get better.        Exercises Other Exercises Other Exercises: OT engages pt in toileting tasks and standing g/h tasks including washing hands, oral care, and washing face for a total of ~6-7 mins standing with RW for UE Support and CGA with progress to SUPV.   Shoulder Instructions       General Comments      Pertinent Vitals/ Pain       Pain Assessment: Faces Faces Pain Scale: Hurts a little bit Pain Location: R knee (chronic) Pain Descriptors / Indicators: Aching Pain Intervention(s): Monitored during session  Home Living                                          Prior Functioning/Environment              Frequency  Min 2X/week        Progress Toward Goals  OT Goals(current goals can now be found in the care plan section)  Progress towards OT goals: Progressing toward goals  Acute Rehab OT Goals Patient Stated Goal: to be able to go back to my house OT Goal Formulation: With patient Time For Goal Achievement: 07/15/21 Potential to Achieve Goals: [place] Discharge plan remains appropriate;Frequency remains appropriate    Co-evaluation                 AM-PAC OT "6 Clicks" Daily Activity     Outcome Measure   Help from another person eating meals?: None Help from another person taking care of personal grooming?: A Little Help from another person toileting, which includes using toliet, bedpan, or urinal?: A Little Help from another person bathing (including washing, rinsing, drying)?: A Little Help from another person to put on and taking off regular upper body clothing?: None Help from another person to put on and taking off regular lower body clothing?: A Little 6 Click Score: 20    End of Session Equipment Utilized During Treatment: Gait belt;Rolling walker  OT Visit Diagnosis: Unsteadiness on feet  (R26.81);Muscle weakness (generalized) (M62.81)   Activity Tolerance Patient tolerated treatment well   Patient Left in chair;with call bell/phone within reach;with nursing/sitter in room   Nurse Communication Mobility status        Time: 1157-2620 OT Time Calculation (min): 29 min  Charges: OT General Charges $OT Visit: 1 Visit OT Treatments $Self Care/Home Management : 8-22 mins $Therapeutic Activity: 8-22 mins  Gerrianne Scale, MS, OTR/L ascom [phone] 07/04/21, 11:49 AM

## 2021-07-04 NOTE — TOC Transition Note (Signed)
Transition of Care [facility]) - CM/SW Discharge Note   Patient Details  Name: Erin Sutton MRN: [id number] Date of Birth: 25-Apr-1941  Transition of [facility]) CM/SW Contact:  Pete Pelt, RN Phone Number: 07/04/2021, 11:17 AM   Clinical Narrative:   Patient will be transported to [facility] today at 4pm by [facility].  As per Steffanie Dunn, Patient will be going to room 606B.  Patient and family aware.       Barriers to Discharge: Continued Medical Work up   Patient Goals and CMS Choice Patient states their goals for this hospitalization and ongoing recovery are:: SNF rehab CMS Medicare.gov Compare Post Acute Care list provided to:: Patient Choice offered to / list presented to : Patient  Discharge Placement                       Discharge Plan and Services                                     Social Determinants of Health (SDOH) Interventions     Readmission Risk Interventions No flowsheet data found.

## 2021-07-04 NOTE — Progress Notes (Signed)
PROGRESS NOTE        PATIENT DETAILS Name: Erin Sutton Age: 80 y.o. Sex: female Date of Birth: September 03, 1941 Admit Date: 06/29/2021 Admitting Physician Lorella Nimrod, MD OVF:[id number], Chrystie Nose, MD  Brief Narrative: Patient is a 80 y.o. 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  She was subsequently admitted to the hospitalist service for further evaluation and treatment.  Significant events: 8/6>> admit for confusion.  Significant studies: 8/6>> CT head: No acute intracranial abnormality. 8/6>> CXR: No pneumonia.  Antimicrobial therapy: None  Microbiology data: 8/6>> blood culture: No growth 8/6>> urine culture: 20,000 colonies of Klebsiella pneumoniae, 30,000 colonies of Proteus mirabilis.  Procedures : None  Consults: None  DVT Prophylaxis : apixaban (ELIQUIS) tablet 5 mg    Subjective: Frustrated that she is still here-hopefully she can go to SNF today.  No major issues overnight.   Assessment/Plan: Transient confusion/encephalopathy: Resolved-unclear etiology.  Could have some mild cognitive dysfunction at baseline-did could have worsened due to delirium/lorazepam use.  CT head without any acute abnormalities.  Continue supportive care.  Recent COVID-19 infection: Does not require any further isolation.  She tested positive on July 21-cycle threshold on the PCR on admission was 38.5.  She has no respiratory symptoms.  History of VTE: Continue Eliquis.  PAF: Sinus rhythm on EKG on admission-not on any rate control medications-on Eliquis.  HTN: BP reasonable-continue amlodipine and Aldactone.  Asymptomatic bacteriuria: She has no symptoms of UTI-UA was benign-urine culture as above-likely reflective of colonization rather than a true infection.   Do not think she needs antimicrobial therapy.  Anxiety/depression: No longer on Celexa-continue trazodone-on as needed lorazepam (long-term med) for anxiety.  Debility/deconditioning/frailty: Suspect some amount of frailty at baseline-probably worsened since she recently had COVID.  Evaluated by PT-felt to require SNF-social worker following.  Obesity: Estimated body mass index is 32.22 kg/m as calculated from the following:   Height as of this encounter: 5' (1.524 m).   Weight as of this encounter: 74.8 kg.   Diet: Diet Order             Diet - low sodium heart healthy           Diet Heart Room service appropriate? Yes; Fluid consistency: Thin  Diet effective now                    Code Status: Full code   Family Communication: Spoke with daughter-Tammy-[phone]-on 8/8  Disposition Plan: Status is: Observation  The patient will require care spanning > 2 midnights and should be moved to inpatient because: Unsafe d/c plan  Dispo: The patient is from: Home              Anticipated d/c is to: SNF              Patient currently is not medically stable to d/c.   Difficult to place patient No   Barriers to Discharge: Unsafe disposition-lives alone-appears to have some amount of cognitive dysfunction-PT recommending SNF.  Antimicrobial agents: Anti-infectives (From admission, onward)    Start     Dose/Rate Route Frequency Ordered Stop   06/30/21 1000  remdesivir 100 mg in sodium chloride 0.9 % 100 mL IVPB  Status:  Discontinued       See Hyperspace for full Linked Orders Report.   100 mg 200 mL/hr over 30 Minutes Intravenous Daily 06/29/21 1145 06/29/21 1245   06/29/21 1300  remdesivir 200 mg in sodium chloride 0.9% 250 mL IVPB  Status:  Discontinued       See Hyperspace for full Linked Orders Report.   200 mg 580 mL/hr over 30 Minutes Intravenous Once 06/29/21 1145 06/29/21 1245        Time spent: 15-minutes-Greater than 50% of this time was spent in  counseling, explanation of diagnosis, planning of further management, and coordination of care.  MEDICATIONS: Scheduled Meds:  amLODipine  10 mg Oral Daily   apixaban  5 mg Oral BID   vitamin C  500 mg Oral Daily   atorvastatin  40 mg Oral Daily   levothyroxine  50 mcg Oral QAC breakfast   multivitamin with minerals  1 tablet Oral Daily   pantoprazole  40 mg Oral Daily   polyvinyl alcohol   Both Eyes BID   spironolactone  25 mg Oral Daily   traZODone  50 mg Oral QHS   zinc sulfate  220 mg Oral Daily   Continuous Infusions: PRN Meds:.acetaminophen, guaiFENesin-dextromethorphan, LORazepam, ondansetron **OR** ondansetron (ZOFRAN) IV, polyethylene glycol   PHYSICAL EXAM: Vital signs: Vitals:   07/03/21 2009 07/04/21 0022 07/04/21 0432 07/04/21 0825  BP: 105/60 110/65 (!) 151/66 127/62  Pulse: 78 67 80 75  Resp:  18 18 18   Temp:  98.2 F (36.8 C) 97.6 F (36.4 C) 98.1 F (36.7 C)  TempSrc:  Oral Oral Oral  SpO2: 97% 100% 95% 95%  Weight:      Height:       Filed Weights   06/29/21 1023  Weight: 74.8 kg   Body mass index is 32.22 kg/m.   Gen Exam:Alert awake-not in any distress HEENT:atraumatic, normocephalic Chest: B/L clear to auscultation anteriorly CVS:S1S2 regular Abdomen:soft non tender, non distended Extremities:no edema Neurology: Non focal Skin: no rash   I have personally reviewed following labs and imaging studies  LABORATORY DATA: CBC: Recent Labs  Lab 06/29/21 1003 06/30/21 0646 07/03/21 0348  WBC 6.1 7.7 8.6  NEUTROABS 4.5  --   --   HGB 13.8 12.9 12.1  HCT 41.5 37.9 36.6  MCV 92.6 91.5 93.1  PLT 236 223 216     Basic Metabolic Panel: Recent Labs  Lab 06/29/21 1003 06/30/21 0646  NA 137 137  K 3.6 3.7  CL 102 101  CO2 23 25  GLUCOSE 106* 82  BUN 9 8  CREATININE 0.82 0.76  CALCIUM 9.7 9.2  MG  --  1.9  PHOS  --  2.9     GFR: Estimated Creatinine Clearance: 51.5 mL/min (by C-G formula based on SCr of 0.76 mg/dL).  Liver  Function Tests: Recent Labs  Lab 06/29/21 1003 06/30/21 0646  AST 29 25  ALT 15 14  ALKPHOS 66 62  BILITOT 1.3* 1.3*  PROT 7.6 6.9  ALBUMIN 4.1 3.8    No results for input(s): LIPASE, AMYLASE in the last 168 hours. No results for input(s): AMMONIA in the last 168 hours.  Coagulation Profile: Recent Labs  Lab 06/29/21 1003  INR 1.5*     Cardiac Enzymes: Recent Labs  Lab 06/29/21 1003  CKTOTAL 40     BNP (last 3 results) No results for input(s): PROBNP in the last 8760 hours.  Lipid Profile: No results for input(s): CHOL, HDL, LDLCALC, TRIG, CHOLHDL,  LDLDIRECT in the last 72 hours.  Thyroid Function Tests: No results for input(s): TSH, T4TOTAL, FREET4, T3FREE, THYROIDAB in the last 72 hours.   Anemia Panel: No results for input(s): VITAMINB12, FOLATE, FERRITIN, TIBC, IRON, RETICCTPCT in the last 72 hours.   Urine analysis:    Component Value Date/Time   COLORURINE COLORLESS (A) 06/29/2021 1003   APPEARANCEUR CLEAR (A) 06/29/2021 1003   APPEARANCEUR Hazy 07/25/2014 1350   LABSPEC 1.005 06/29/2021 1003   LABSPEC 1.020 07/25/2014 1350   PHURINE 9.0 (H) 06/29/2021 1003   GLUCOSEU NEGATIVE 06/29/2021 1003   GLUCOSEU Negative 07/25/2014 1350   HGBUR SMALL (A) 06/29/2021 1003   BILIRUBINUR NEGATIVE 06/29/2021 1003   BILIRUBINUR Negative 07/25/2014 1350   KETONESUR 5 (A) 06/29/2021 1003   PROTEINUR NEGATIVE 06/29/2021 1003   NITRITE NEGATIVE 06/29/2021 1003   LEUKOCYTESUR NEGATIVE 06/29/2021 1003   LEUKOCYTESUR 2+ 07/25/2014 1350    Sepsis Labs: Lactic Acid, Venous    Component Value Date/Time   LATICACIDVEN 1.2 12/17/2016 1816    MICROBIOLOGY: Recent Results (from the past 240 hour(s))  Resp Panel by RT-PCR (Flu A&B, Covid) Nasopharyngeal Swab     Status: Abnormal   Collection Time: 06/29/21 10:03 AM   Specimen: Nasopharyngeal Swab; Nasopharyngeal(NP) swabs in vial transport medium  Result Value Ref Range Status   SARS Coronavirus 2 by RT PCR  POSITIVE (A) NEGATIVE Final    Comment: RESULT CALLED TO, READ BACK BY AND VERIFIED WITH: LINDSAY BLACK ON 06/29/21 AT 1109 QSD (NOTE) SARS-CoV-2 target nucleic acids are DETECTED.  The SARS-CoV-2 RNA is generally detectable in upper respiratory specimens during the acute phase of infection. Positive results are indicative of the presence of the identified virus, but do not rule out bacterial infection or co-infection with other pathogens not detected by the test. Clinical correlation with patient history and other diagnostic information is necessary to determine patient infection status. The expected result is Negative.  Fact Sheet for Patients: EntrepreneurPulse.com.au  Fact Sheet for Healthcare Providers: IncredibleEmployment.be  This test is not yet approved or cleared by the [place] FDA and  has been authorized for detection and/or diagnosis of SARS-CoV-2 by FDA under an Emergency Use Authorization (EUA).  This EUA will remain in effect (meaning this test can  be used) for the duration of  the COVID-19 declaration under Section 564(b)(1) of the Act, 21 U.S.C. section 360bbb-3(b)(1), unless the authorization is terminated or revoked sooner.     Influenza A by PCR NEGATIVE NEGATIVE Final   Influenza B by PCR NEGATIVE NEGATIVE Final    Comment: (NOTE) The Xpert Xpress SARS-CoV-2/FLU/RSV plus assay is intended as an aid in the diagnosis of influenza from Nasopharyngeal swab specimens and should not be used as a sole basis for treatment. Nasal washings and aspirates are unacceptable for Xpert Xpress SARS-CoV-2/FLU/RSV testing.  Fact Sheet for Patients: EntrepreneurPulse.com.au  Fact Sheet for Healthcare Providers: IncredibleEmployment.be  This test is not yet approved or cleared by the [place] FDA and has been authorized for detection and/or diagnosis of SARS-CoV-2 by FDA under an Emergency  Use Authorization (EUA). This EUA will remain in effect (meaning this test can be used) for the duration of the COVID-19 declaration under Section 564(b)(1) of the Act, 21 U.S.C. section 360bbb-3(b)(1), unless the authorization is terminated or revoked.  Performed at [facility], [address] [place], [place] [address]   Urine Culture     Status: Abnormal   Collection Time: 06/29/21 10:03 AM   Specimen: Urine,  Random  Result Value Ref Range Status   Specimen Description   Final    URINE, RANDOM Performed at [facility], [address] [place], [place] [address]    Special Requests   Final    NONE Performed at [facility], [address] [place], [place] [address]    Culture (A)  Final    20,000 COLONIES/mL KLEBSIELLA PNEUMONIAE 30,000 COLONIES/mL PROTEUS MIRABILIS    Report Status 07/02/2021 FINAL  Final   Organism ID, Bacteria KLEBSIELLA PNEUMONIAE (A)  Final   Organism ID, Bacteria PROTEUS MIRABILIS (A)  Final      Susceptibility   Klebsiella pneumoniae - MIC*    AMPICILLIN >=32 RESISTANT Resistant     CEFAZOLIN <=4 SENSITIVE Sensitive     CEFEPIME <=0.12 SENSITIVE Sensitive     CEFTRIAXONE <=0.25 SENSITIVE Sensitive     CIPROFLOXACIN <=0.25 SENSITIVE Sensitive     GENTAMICIN <=1 SENSITIVE Sensitive     IMIPENEM <=0.25 SENSITIVE Sensitive     NITROFURANTOIN 64 INTERMEDIATE Intermediate     TRIMETH/SULFA <=20 SENSITIVE Sensitive     AMPICILLIN/SULBACTAM 8 SENSITIVE Sensitive     PIP/TAZO <=4 SENSITIVE Sensitive     * 20,000 COLONIES/mL KLEBSIELLA PNEUMONIAE   Proteus mirabilis - MIC*    AMPICILLIN <=2 SENSITIVE Sensitive     CEFAZOLIN 8 SENSITIVE Sensitive     CEFEPIME <=0.12 SENSITIVE Sensitive     CEFTRIAXONE <=0.25 SENSITIVE Sensitive     CIPROFLOXACIN <=0.25 SENSITIVE Sensitive     GENTAMICIN <=1 SENSITIVE Sensitive     IMIPENEM 2 SENSITIVE Sensitive     NITROFURANTOIN 128 RESISTANT Resistant     TRIMETH/SULFA <=20  SENSITIVE Sensitive     AMPICILLIN/SULBACTAM <=2 SENSITIVE Sensitive     PIP/TAZO <=4 SENSITIVE Sensitive     * 30,000 COLONIES/mL PROTEUS MIRABILIS  Culture, blood (Routine X 2) w Reflex to ID Panel     Status: None   Collection Time: 06/29/21 12:53 PM   Specimen: BLOOD  Result Value Ref Range Status   Specimen Description BLOOD RIGHT ANTECUBITAL  Final   Special Requests   Final    BOTTLES DRAWN AEROBIC AND ANAEROBIC Blood Culture results may not be optimal due to an excessive volume of blood received in culture bottles   Culture   Final    NO GROWTH 5 DAYS Performed at [facility], [address], [place], [place] [address]    Report Status 07/04/2021 FINAL  Final  Culture, blood (Routine X 2) w Reflex to ID Panel     Status: None   Collection Time: 06/29/21 12:55 PM   Specimen: BLOOD  Result Value Ref Range Status   Specimen Description BLOOD LEFT ANTECUBITAL  Final   Special Requests   Final    BOTTLES DRAWN AEROBIC AND ANAEROBIC Blood Culture adequate volume   Culture   Final    NO GROWTH 5 DAYS Performed at [facility], [address], [place], [place] [address]    Report Status 07/04/2021 FINAL  Final    RADIOLOGY STUDIES/RESULTS: No results found.   LOS: 0 days   Oren Binet, MD  [facility]    To contact the attending provider between 7A-7P or the covering provider during after hours 7P-7A, please log into the web site [email or web address] and access using universal [place] password for that web site. If you do not have the password, please call the hospital operator.  07/04/2021, 11:20 AM

## 2021-07-04 NOTE — Progress Notes (Signed)
Report called to Wendy Poet LPN at [facility] patient going to [place]  [phone]

## 2021-07-05 DIAGNOSIS — R4182 Altered mental status, unspecified: Secondary | ICD-10-CM | POA: Diagnosis not present

## 2021-07-05 DIAGNOSIS — I480 Paroxysmal atrial fibrillation: Secondary | ICD-10-CM | POA: Diagnosis not present

## 2021-07-05 DIAGNOSIS — F32A Depression, unspecified: Secondary | ICD-10-CM | POA: Diagnosis not present

## 2021-07-05 DIAGNOSIS — M6281 Muscle weakness (generalized): Secondary | ICD-10-CM | POA: Diagnosis not present

## 2021-07-05 DIAGNOSIS — Z86718 Personal history of other venous thrombosis and embolism: Secondary | ICD-10-CM | POA: Diagnosis not present

## 2021-07-05 DIAGNOSIS — Z8616 Personal history of COVID-19: Secondary | ICD-10-CM | POA: Diagnosis not present

## 2021-07-05 DIAGNOSIS — F419 Anxiety disorder, unspecified: Secondary | ICD-10-CM | POA: Diagnosis not present

## 2021-07-05 DIAGNOSIS — I10 Essential (primary) hypertension: Secondary | ICD-10-CM | POA: Diagnosis not present

## 2021-07-05 DIAGNOSIS — E539 Vitamin B deficiency, unspecified: Secondary | ICD-10-CM | POA: Diagnosis not present

## 2021-07-08 DIAGNOSIS — E539 Vitamin B deficiency, unspecified: Secondary | ICD-10-CM | POA: Diagnosis not present

## 2021-07-08 DIAGNOSIS — Z8616 Personal history of COVID-19: Secondary | ICD-10-CM | POA: Diagnosis not present

## 2021-07-08 DIAGNOSIS — R4182 Altered mental status, unspecified: Secondary | ICD-10-CM | POA: Diagnosis not present

## 2021-07-08 DIAGNOSIS — K219 Gastro-esophageal reflux disease without esophagitis: Secondary | ICD-10-CM | POA: Diagnosis not present

## 2021-07-08 DIAGNOSIS — M6281 Muscle weakness (generalized): Secondary | ICD-10-CM | POA: Diagnosis not present

## 2021-07-08 DIAGNOSIS — I10 Essential (primary) hypertension: Secondary | ICD-10-CM | POA: Diagnosis not present

## 2021-07-08 DIAGNOSIS — I480 Paroxysmal atrial fibrillation: Secondary | ICD-10-CM | POA: Diagnosis not present

## 2021-07-08 DIAGNOSIS — Z86718 Personal history of other venous thrombosis and embolism: Secondary | ICD-10-CM | POA: Diagnosis not present

## 2021-07-09 ENCOUNTER — Telehealth: Payer: Medicare HMO

## 2021-07-09 ENCOUNTER — Other Ambulatory Visit: Payer: Medicare HMO | Admitting: *Deleted

## 2021-07-09 DIAGNOSIS — F419 Anxiety disorder, unspecified: Secondary | ICD-10-CM | POA: Diagnosis not present

## 2021-07-09 DIAGNOSIS — I10 Essential (primary) hypertension: Secondary | ICD-10-CM | POA: Diagnosis not present

## 2021-07-09 DIAGNOSIS — Z8616 Personal history of COVID-19: Secondary | ICD-10-CM | POA: Diagnosis not present

## 2021-07-09 DIAGNOSIS — M6281 Muscle weakness (generalized): Secondary | ICD-10-CM | POA: Diagnosis not present

## 2021-07-09 DIAGNOSIS — E039 Hypothyroidism, unspecified: Secondary | ICD-10-CM | POA: Diagnosis not present

## 2021-07-09 DIAGNOSIS — F32A Depression, unspecified: Secondary | ICD-10-CM | POA: Diagnosis not present

## 2021-07-09 DIAGNOSIS — I480 Paroxysmal atrial fibrillation: Secondary | ICD-10-CM | POA: Diagnosis not present

## 2021-07-09 DIAGNOSIS — R4182 Altered mental status, unspecified: Secondary | ICD-10-CM | POA: Diagnosis not present

## 2021-07-10 DIAGNOSIS — R4182 Altered mental status, unspecified: Secondary | ICD-10-CM | POA: Diagnosis not present

## 2021-07-10 DIAGNOSIS — E539 Vitamin B deficiency, unspecified: Secondary | ICD-10-CM | POA: Diagnosis not present

## 2021-07-10 DIAGNOSIS — I480 Paroxysmal atrial fibrillation: Secondary | ICD-10-CM | POA: Diagnosis not present

## 2021-07-10 DIAGNOSIS — M6281 Muscle weakness (generalized): Secondary | ICD-10-CM | POA: Diagnosis not present

## 2021-07-10 DIAGNOSIS — Z86718 Personal history of other venous thrombosis and embolism: Secondary | ICD-10-CM | POA: Diagnosis not present

## 2021-07-10 DIAGNOSIS — K219 Gastro-esophageal reflux disease without esophagitis: Secondary | ICD-10-CM | POA: Diagnosis not present

## 2021-07-10 DIAGNOSIS — I10 Essential (primary) hypertension: Secondary | ICD-10-CM | POA: Diagnosis not present

## 2021-07-10 DIAGNOSIS — Z8616 Personal history of COVID-19: Secondary | ICD-10-CM | POA: Diagnosis not present

## 2021-07-10 DIAGNOSIS — D519 Vitamin B12 deficiency anemia, unspecified: Secondary | ICD-10-CM | POA: Diagnosis not present

## 2021-07-10 DIAGNOSIS — E039 Hypothyroidism, unspecified: Secondary | ICD-10-CM | POA: Diagnosis not present

## 2021-07-10 NOTE — Patient Outreach (Signed)
[place] [facility]) Care Management  07/10/2021  Erin Sutton December 28, 1940 [id number]   CSW was able to speak with pt who reports she is going to be released from SNF this week back to home. Per pt, she was admitted to hospital for "blood pressure issues" and then went to SNF rehab at  [facility]. Pt will be seeing her PCP this week and is reminded to make sure the home health care is arranged by SNF staff if possible or by PCP.  Pt has lost track with her counselor and CSW has offered to advise [place] for reconnecting. Pt agreeable to plan.  CSW offered encouragement and support and will plan to follow up with pt 07/31/21  [name], MSW, [place] Worker  [place] [phone]

## 2021-07-11 ENCOUNTER — Other Ambulatory Visit: Payer: Self-pay | Admitting: Internal Medicine

## 2021-07-11 DIAGNOSIS — F419 Anxiety disorder, unspecified: Secondary | ICD-10-CM | POA: Diagnosis not present

## 2021-07-11 DIAGNOSIS — Z1231 Encounter for screening mammogram for malignant neoplasm of breast: Secondary | ICD-10-CM

## 2021-07-11 DIAGNOSIS — Z0001 Encounter for general adult medical examination with abnormal findings: Secondary | ICD-10-CM | POA: Diagnosis not present

## 2021-07-11 DIAGNOSIS — I10 Essential (primary) hypertension: Secondary | ICD-10-CM | POA: Diagnosis not present

## 2021-07-11 DIAGNOSIS — E039 Hypothyroidism, unspecified: Secondary | ICD-10-CM | POA: Diagnosis not present

## 2021-07-11 DIAGNOSIS — Z09 Encounter for follow-up examination after completed treatment for conditions other than malignant neoplasm: Secondary | ICD-10-CM | POA: Diagnosis not present

## 2021-07-11 DIAGNOSIS — N289 Disorder of kidney and ureter, unspecified: Secondary | ICD-10-CM | POA: Diagnosis not present

## 2021-07-17 DIAGNOSIS — I10 Essential (primary) hypertension: Secondary | ICD-10-CM | POA: Diagnosis not present

## 2021-07-19 DIAGNOSIS — I130 Hypertensive heart and chronic kidney disease with heart failure and stage 1 through stage 4 chronic kidney disease, or unspecified chronic kidney disease: Secondary | ICD-10-CM | POA: Diagnosis not present

## 2021-07-19 DIAGNOSIS — D631 Anemia in chronic kidney disease: Secondary | ICD-10-CM | POA: Diagnosis not present

## 2021-07-19 DIAGNOSIS — F411 Generalized anxiety disorder: Secondary | ICD-10-CM | POA: Diagnosis not present

## 2021-07-19 DIAGNOSIS — D259 Leiomyoma of uterus, unspecified: Secondary | ICD-10-CM | POA: Diagnosis not present

## 2021-07-19 DIAGNOSIS — F0390 Unspecified dementia without behavioral disturbance: Secondary | ICD-10-CM | POA: Diagnosis not present

## 2021-07-19 DIAGNOSIS — I5032 Chronic diastolic (congestive) heart failure: Secondary | ICD-10-CM | POA: Diagnosis not present

## 2021-07-19 DIAGNOSIS — N189 Chronic kidney disease, unspecified: Secondary | ICD-10-CM | POA: Diagnosis not present

## 2021-07-19 DIAGNOSIS — D630 Anemia in neoplastic disease: Secondary | ICD-10-CM | POA: Diagnosis not present

## 2021-07-19 DIAGNOSIS — K529 Noninfective gastroenteritis and colitis, unspecified: Secondary | ICD-10-CM | POA: Diagnosis not present

## 2021-07-23 DIAGNOSIS — M1711 Unilateral primary osteoarthritis, right knee: Secondary | ICD-10-CM | POA: Diagnosis not present

## 2021-07-24 DIAGNOSIS — I6523 Occlusion and stenosis of bilateral carotid arteries: Secondary | ICD-10-CM | POA: Diagnosis not present

## 2021-07-24 DIAGNOSIS — I480 Paroxysmal atrial fibrillation: Secondary | ICD-10-CM | POA: Diagnosis not present

## 2021-07-24 DIAGNOSIS — E7800 Pure hypercholesterolemia, unspecified: Secondary | ICD-10-CM | POA: Diagnosis not present

## 2021-07-24 DIAGNOSIS — I10 Essential (primary) hypertension: Secondary | ICD-10-CM | POA: Diagnosis not present

## 2021-07-25 DIAGNOSIS — M1711 Unilateral primary osteoarthritis, right knee: Secondary | ICD-10-CM | POA: Diagnosis not present

## 2021-07-25 DIAGNOSIS — M19011 Primary osteoarthritis, right shoulder: Secondary | ICD-10-CM | POA: Diagnosis not present

## 2021-07-25 DIAGNOSIS — K529 Noninfective gastroenteritis and colitis, unspecified: Secondary | ICD-10-CM | POA: Diagnosis not present

## 2021-07-25 DIAGNOSIS — F0390 Unspecified dementia without behavioral disturbance: Secondary | ICD-10-CM | POA: Diagnosis not present

## 2021-07-25 DIAGNOSIS — I5032 Chronic diastolic (congestive) heart failure: Secondary | ICD-10-CM | POA: Diagnosis not present

## 2021-07-25 DIAGNOSIS — I130 Hypertensive heart and chronic kidney disease with heart failure and stage 1 through stage 4 chronic kidney disease, or unspecified chronic kidney disease: Secondary | ICD-10-CM | POA: Diagnosis not present

## 2021-07-25 DIAGNOSIS — N189 Chronic kidney disease, unspecified: Secondary | ICD-10-CM | POA: Diagnosis not present

## 2021-07-25 DIAGNOSIS — M810 Age-related osteoporosis without current pathological fracture: Secondary | ICD-10-CM | POA: Diagnosis not present

## 2021-07-25 DIAGNOSIS — M19012 Primary osteoarthritis, left shoulder: Secondary | ICD-10-CM | POA: Diagnosis not present

## 2021-07-27 DIAGNOSIS — Z743 Need for continuous supervision: Secondary | ICD-10-CM | POA: Diagnosis not present

## 2021-07-27 DIAGNOSIS — R404 Transient alteration of awareness: Secondary | ICD-10-CM | POA: Diagnosis not present

## 2021-07-27 DIAGNOSIS — R4701 Aphasia: Secondary | ICD-10-CM | POA: Diagnosis not present

## 2021-07-27 DIAGNOSIS — I499 Cardiac arrhythmia, unspecified: Secondary | ICD-10-CM | POA: Diagnosis not present

## 2021-07-27 DIAGNOSIS — E039 Hypothyroidism, unspecified: Secondary | ICD-10-CM | POA: Diagnosis not present

## 2021-07-27 DIAGNOSIS — Z7902 Long term (current) use of antithrombotics/antiplatelets: Secondary | ICD-10-CM | POA: Diagnosis not present

## 2021-07-27 DIAGNOSIS — G9349 Other encephalopathy: Secondary | ICD-10-CM | POA: Diagnosis not present

## 2021-07-27 DIAGNOSIS — F419 Anxiety disorder, unspecified: Secondary | ICD-10-CM | POA: Diagnosis not present

## 2021-07-27 DIAGNOSIS — R509 Fever, unspecified: Secondary | ICD-10-CM | POA: Diagnosis not present

## 2021-07-27 DIAGNOSIS — R6889 Other general symptoms and signs: Secondary | ICD-10-CM | POA: Diagnosis not present

## 2021-07-27 DIAGNOSIS — R4182 Altered mental status, unspecified: Secondary | ICD-10-CM | POA: Diagnosis not present

## 2021-07-27 DIAGNOSIS — I10 Essential (primary) hypertension: Secondary | ICD-10-CM | POA: Diagnosis not present

## 2021-07-27 DIAGNOSIS — I480 Paroxysmal atrial fibrillation: Secondary | ICD-10-CM | POA: Diagnosis not present

## 2021-07-27 DIAGNOSIS — K219 Gastro-esophageal reflux disease without esophagitis: Secondary | ICD-10-CM | POA: Diagnosis not present

## 2021-07-27 DIAGNOSIS — R531 Weakness: Secondary | ICD-10-CM | POA: Diagnosis not present

## 2021-07-27 DIAGNOSIS — I6783 Posterior reversible encephalopathy syndrome: Secondary | ICD-10-CM | POA: Diagnosis not present

## 2021-07-27 DIAGNOSIS — Z20822 Contact with and (suspected) exposure to covid-19: Secondary | ICD-10-CM | POA: Diagnosis not present

## 2021-07-27 DIAGNOSIS — M6281 Muscle weakness (generalized): Secondary | ICD-10-CM | POA: Diagnosis not present

## 2021-07-28 DIAGNOSIS — I445 Left posterior fascicular block: Secondary | ICD-10-CM | POA: Diagnosis not present

## 2021-07-28 DIAGNOSIS — I480 Paroxysmal atrial fibrillation: Secondary | ICD-10-CM | POA: Diagnosis not present

## 2021-07-28 DIAGNOSIS — G9340 Encephalopathy, unspecified: Secondary | ICD-10-CM | POA: Diagnosis not present

## 2021-07-28 DIAGNOSIS — I4510 Unspecified right bundle-branch block: Secondary | ICD-10-CM | POA: Diagnosis not present

## 2021-07-28 DIAGNOSIS — E039 Hypothyroidism, unspecified: Secondary | ICD-10-CM | POA: Diagnosis not present

## 2021-07-28 DIAGNOSIS — I10 Essential (primary) hypertension: Secondary | ICD-10-CM | POA: Diagnosis not present

## 2021-07-28 DIAGNOSIS — R7989 Other specified abnormal findings of blood chemistry: Secondary | ICD-10-CM | POA: Diagnosis not present

## 2021-07-28 DIAGNOSIS — R4182 Altered mental status, unspecified: Secondary | ICD-10-CM | POA: Diagnosis not present

## 2021-07-28 DIAGNOSIS — R531 Weakness: Secondary | ICD-10-CM | POA: Diagnosis not present

## 2021-07-29 DIAGNOSIS — I480 Paroxysmal atrial fibrillation: Secondary | ICD-10-CM | POA: Diagnosis not present

## 2021-07-29 DIAGNOSIS — K219 Gastro-esophageal reflux disease without esophagitis: Secondary | ICD-10-CM | POA: Diagnosis not present

## 2021-07-29 DIAGNOSIS — I10 Essential (primary) hypertension: Secondary | ICD-10-CM | POA: Diagnosis not present

## 2021-07-29 DIAGNOSIS — G9340 Encephalopathy, unspecified: Secondary | ICD-10-CM | POA: Diagnosis not present

## 2021-07-29 DIAGNOSIS — R531 Weakness: Secondary | ICD-10-CM | POA: Diagnosis not present

## 2021-07-29 DIAGNOSIS — E039 Hypothyroidism, unspecified: Secondary | ICD-10-CM | POA: Diagnosis not present

## 2021-07-30 ENCOUNTER — Inpatient Hospital Stay: Admission: RE | Admit: 2021-07-30 | Payer: Medicare HMO | Source: Ambulatory Visit

## 2021-07-30 DIAGNOSIS — R531 Weakness: Secondary | ICD-10-CM | POA: Diagnosis not present

## 2021-07-30 DIAGNOSIS — I10 Essential (primary) hypertension: Secondary | ICD-10-CM | POA: Diagnosis not present

## 2021-07-30 DIAGNOSIS — E039 Hypothyroidism, unspecified: Secondary | ICD-10-CM | POA: Diagnosis not present

## 2021-07-30 DIAGNOSIS — U071 COVID-19: Secondary | ICD-10-CM | POA: Diagnosis not present

## 2021-07-30 DIAGNOSIS — R0602 Shortness of breath: Secondary | ICD-10-CM | POA: Diagnosis not present

## 2021-07-30 DIAGNOSIS — K219 Gastro-esophageal reflux disease without esophagitis: Secondary | ICD-10-CM | POA: Diagnosis not present

## 2021-07-30 DIAGNOSIS — G9340 Encephalopathy, unspecified: Secondary | ICD-10-CM | POA: Diagnosis not present

## 2021-07-30 DIAGNOSIS — I480 Paroxysmal atrial fibrillation: Secondary | ICD-10-CM | POA: Diagnosis not present

## 2021-07-31 ENCOUNTER — Ambulatory Visit: Payer: Self-pay | Admitting: *Deleted

## 2021-07-31 DIAGNOSIS — I480 Paroxysmal atrial fibrillation: Secondary | ICD-10-CM | POA: Diagnosis not present

## 2021-07-31 DIAGNOSIS — R531 Weakness: Secondary | ICD-10-CM | POA: Diagnosis not present

## 2021-07-31 DIAGNOSIS — R42 Dizziness and giddiness: Secondary | ICD-10-CM | POA: Diagnosis not present

## 2021-07-31 DIAGNOSIS — E039 Hypothyroidism, unspecified: Secondary | ICD-10-CM | POA: Diagnosis not present

## 2021-07-31 DIAGNOSIS — K219 Gastro-esophageal reflux disease without esophagitis: Secondary | ICD-10-CM | POA: Diagnosis not present

## 2021-07-31 DIAGNOSIS — G9340 Encephalopathy, unspecified: Secondary | ICD-10-CM | POA: Diagnosis not present

## 2021-07-31 DIAGNOSIS — I10 Essential (primary) hypertension: Secondary | ICD-10-CM | POA: Diagnosis not present

## 2021-07-31 DIAGNOSIS — U071 COVID-19: Secondary | ICD-10-CM | POA: Diagnosis not present

## 2021-08-01 ENCOUNTER — Telehealth: Payer: Self-pay | Admitting: *Deleted

## 2021-08-01 DIAGNOSIS — G9340 Encephalopathy, unspecified: Secondary | ICD-10-CM | POA: Diagnosis not present

## 2021-08-01 DIAGNOSIS — U071 COVID-19: Secondary | ICD-10-CM | POA: Diagnosis not present

## 2021-08-01 DIAGNOSIS — R42 Dizziness and giddiness: Secondary | ICD-10-CM | POA: Diagnosis not present

## 2021-08-01 DIAGNOSIS — R531 Weakness: Secondary | ICD-10-CM | POA: Diagnosis not present

## 2021-08-01 DIAGNOSIS — I10 Essential (primary) hypertension: Secondary | ICD-10-CM | POA: Diagnosis not present

## 2021-08-01 DIAGNOSIS — E039 Hypothyroidism, unspecified: Secondary | ICD-10-CM | POA: Diagnosis not present

## 2021-08-01 DIAGNOSIS — I480 Paroxysmal atrial fibrillation: Secondary | ICD-10-CM | POA: Diagnosis not present

## 2021-08-01 DIAGNOSIS — K219 Gastro-esophageal reflux disease without esophagitis: Secondary | ICD-10-CM | POA: Diagnosis not present

## 2021-08-01 NOTE — Patient Outreach (Signed)
[place] [facility]) Care Management  08/01/2021  Erin Sutton Nov 20, 1941 [id number]         CSW made an attempt to contact patient for scheduled follow up. CSW was unable to speak with pt and left a HIPAA compliant message. CSW will try again in 3-4 business days if no return call is received.    [name], MSW, [place] Worker  [place] [phone]

## 2021-08-02 DIAGNOSIS — K219 Gastro-esophageal reflux disease without esophagitis: Secondary | ICD-10-CM | POA: Diagnosis not present

## 2021-08-02 DIAGNOSIS — U071 COVID-19: Secondary | ICD-10-CM | POA: Diagnosis not present

## 2021-08-02 DIAGNOSIS — R531 Weakness: Secondary | ICD-10-CM | POA: Diagnosis not present

## 2021-08-02 DIAGNOSIS — R42 Dizziness and giddiness: Secondary | ICD-10-CM | POA: Diagnosis not present

## 2021-08-02 DIAGNOSIS — E039 Hypothyroidism, unspecified: Secondary | ICD-10-CM | POA: Diagnosis not present

## 2021-08-02 DIAGNOSIS — G9340 Encephalopathy, unspecified: Secondary | ICD-10-CM | POA: Diagnosis not present

## 2021-08-02 DIAGNOSIS — I480 Paroxysmal atrial fibrillation: Secondary | ICD-10-CM | POA: Diagnosis not present

## 2021-08-02 DIAGNOSIS — I10 Essential (primary) hypertension: Secondary | ICD-10-CM | POA: Diagnosis not present

## 2021-08-03 DIAGNOSIS — I10 Essential (primary) hypertension: Secondary | ICD-10-CM | POA: Diagnosis not present

## 2021-08-03 DIAGNOSIS — I480 Paroxysmal atrial fibrillation: Secondary | ICD-10-CM | POA: Diagnosis not present

## 2021-08-03 DIAGNOSIS — E039 Hypothyroidism, unspecified: Secondary | ICD-10-CM | POA: Diagnosis not present

## 2021-08-03 DIAGNOSIS — R531 Weakness: Secondary | ICD-10-CM | POA: Diagnosis not present

## 2021-08-03 DIAGNOSIS — U071 COVID-19: Secondary | ICD-10-CM | POA: Diagnosis not present

## 2021-08-03 DIAGNOSIS — R42 Dizziness and giddiness: Secondary | ICD-10-CM | POA: Diagnosis not present

## 2021-08-03 DIAGNOSIS — K219 Gastro-esophageal reflux disease without esophagitis: Secondary | ICD-10-CM | POA: Diagnosis not present

## 2021-08-03 DIAGNOSIS — G9340 Encephalopathy, unspecified: Secondary | ICD-10-CM | POA: Diagnosis not present

## 2021-08-05 ENCOUNTER — Ambulatory Visit: Payer: Self-pay | Admitting: *Deleted

## 2021-08-05 DIAGNOSIS — K529 Noninfective gastroenteritis and colitis, unspecified: Secondary | ICD-10-CM | POA: Diagnosis not present

## 2021-08-05 DIAGNOSIS — M19011 Primary osteoarthritis, right shoulder: Secondary | ICD-10-CM | POA: Diagnosis not present

## 2021-08-05 DIAGNOSIS — I130 Hypertensive heart and chronic kidney disease with heart failure and stage 1 through stage 4 chronic kidney disease, or unspecified chronic kidney disease: Secondary | ICD-10-CM | POA: Diagnosis not present

## 2021-08-05 DIAGNOSIS — M1711 Unilateral primary osteoarthritis, right knee: Secondary | ICD-10-CM | POA: Diagnosis not present

## 2021-08-05 DIAGNOSIS — N189 Chronic kidney disease, unspecified: Secondary | ICD-10-CM | POA: Diagnosis not present

## 2021-08-05 DIAGNOSIS — M810 Age-related osteoporosis without current pathological fracture: Secondary | ICD-10-CM | POA: Diagnosis not present

## 2021-08-05 DIAGNOSIS — I5032 Chronic diastolic (congestive) heart failure: Secondary | ICD-10-CM | POA: Diagnosis not present

## 2021-08-05 DIAGNOSIS — M19012 Primary osteoarthritis, left shoulder: Secondary | ICD-10-CM | POA: Diagnosis not present

## 2021-08-05 DIAGNOSIS — F0390 Unspecified dementia without behavioral disturbance: Secondary | ICD-10-CM | POA: Diagnosis not present

## 2021-08-07 DIAGNOSIS — M810 Age-related osteoporosis without current pathological fracture: Secondary | ICD-10-CM | POA: Diagnosis not present

## 2021-08-07 DIAGNOSIS — M1711 Unilateral primary osteoarthritis, right knee: Secondary | ICD-10-CM | POA: Diagnosis not present

## 2021-08-07 DIAGNOSIS — M19012 Primary osteoarthritis, left shoulder: Secondary | ICD-10-CM | POA: Diagnosis not present

## 2021-08-07 DIAGNOSIS — M19011 Primary osteoarthritis, right shoulder: Secondary | ICD-10-CM | POA: Diagnosis not present

## 2021-08-07 DIAGNOSIS — K529 Noninfective gastroenteritis and colitis, unspecified: Secondary | ICD-10-CM | POA: Diagnosis not present

## 2021-08-07 DIAGNOSIS — I130 Hypertensive heart and chronic kidney disease with heart failure and stage 1 through stage 4 chronic kidney disease, or unspecified chronic kidney disease: Secondary | ICD-10-CM | POA: Diagnosis not present

## 2021-08-07 DIAGNOSIS — F0390 Unspecified dementia without behavioral disturbance: Secondary | ICD-10-CM | POA: Diagnosis not present

## 2021-08-08 ENCOUNTER — Telehealth: Payer: Self-pay | Admitting: *Deleted

## 2021-08-08 DIAGNOSIS — I130 Hypertensive heart and chronic kidney disease with heart failure and stage 1 through stage 4 chronic kidney disease, or unspecified chronic kidney disease: Secondary | ICD-10-CM | POA: Diagnosis not present

## 2021-08-08 DIAGNOSIS — M19012 Primary osteoarthritis, left shoulder: Secondary | ICD-10-CM | POA: Diagnosis not present

## 2021-08-08 DIAGNOSIS — M19011 Primary osteoarthritis, right shoulder: Secondary | ICD-10-CM | POA: Diagnosis not present

## 2021-08-08 DIAGNOSIS — M810 Age-related osteoporosis without current pathological fracture: Secondary | ICD-10-CM | POA: Diagnosis not present

## 2021-08-08 DIAGNOSIS — M1711 Unilateral primary osteoarthritis, right knee: Secondary | ICD-10-CM | POA: Diagnosis not present

## 2021-08-08 DIAGNOSIS — K529 Noninfective gastroenteritis and colitis, unspecified: Secondary | ICD-10-CM | POA: Diagnosis not present

## 2021-08-08 DIAGNOSIS — F0390 Unspecified dementia without behavioral disturbance: Secondary | ICD-10-CM | POA: Diagnosis not present

## 2021-08-08 DIAGNOSIS — L239 Allergic contact dermatitis, unspecified cause: Secondary | ICD-10-CM | POA: Diagnosis not present

## 2021-08-08 DIAGNOSIS — N189 Chronic kidney disease, unspecified: Secondary | ICD-10-CM | POA: Diagnosis not present

## 2021-08-08 DIAGNOSIS — I5032 Chronic diastolic (congestive) heart failure: Secondary | ICD-10-CM | POA: Diagnosis not present

## 2021-08-08 NOTE — Patient Outreach (Signed)
[place] [facility] Care Management  08/08/2021 LATE ENTRY  MARLAYNA BANNISTER [birth year] [id number]  CSW attempted to reach patient for follow up and was unable to reach pt. CSW left voice message and will await callback or try again 7-10 days.    [name], MSW, [place] Worker  [place] [phone]

## 2021-08-09 DIAGNOSIS — U071 COVID-19: Secondary | ICD-10-CM | POA: Diagnosis not present

## 2021-08-09 DIAGNOSIS — H8113 Benign paroxysmal vertigo, bilateral: Secondary | ICD-10-CM | POA: Diagnosis not present

## 2021-08-09 DIAGNOSIS — Z23 Encounter for immunization: Secondary | ICD-10-CM | POA: Diagnosis not present

## 2021-08-09 DIAGNOSIS — Z09 Encounter for follow-up examination after completed treatment for conditions other than malignant neoplasm: Secondary | ICD-10-CM | POA: Diagnosis not present

## 2021-08-09 DIAGNOSIS — R4182 Altered mental status, unspecified: Secondary | ICD-10-CM | POA: Diagnosis not present

## 2021-08-12 ENCOUNTER — Emergency Department: Payer: Medicare HMO

## 2021-08-12 ENCOUNTER — Observation Stay
Admission: EM | Admit: 2021-08-12 | Discharge: 2021-08-14 | Disposition: A | Discharge status: Home / Self Care | Payer: Medicare HMO | Attending: Family Medicine | Admitting: Family Medicine

## 2021-08-12 ENCOUNTER — Observation Stay: Payer: Medicare HMO

## 2021-08-12 ENCOUNTER — Encounter: Payer: Self-pay | Admitting: Radiology

## 2021-08-12 ENCOUNTER — Other Ambulatory Visit: Payer: Self-pay | Admitting: *Deleted

## 2021-08-12 DIAGNOSIS — R52 Pain, unspecified: Secondary | ICD-10-CM | POA: Diagnosis not present

## 2021-08-12 DIAGNOSIS — I10 Essential (primary) hypertension: Secondary | ICD-10-CM | POA: Diagnosis present

## 2021-08-12 DIAGNOSIS — E785 Hyperlipidemia, unspecified: Secondary | ICD-10-CM | POA: Diagnosis present

## 2021-08-12 DIAGNOSIS — R531 Weakness: Secondary | ICD-10-CM | POA: Diagnosis not present

## 2021-08-12 DIAGNOSIS — R252 Cramp and spasm: Secondary | ICD-10-CM

## 2021-08-12 DIAGNOSIS — R253 Fasciculation: Principal | ICD-10-CM | POA: Insufficient documentation

## 2021-08-12 DIAGNOSIS — G9341 Metabolic encephalopathy: Secondary | ICD-10-CM | POA: Diagnosis not present

## 2021-08-12 DIAGNOSIS — F411 Generalized anxiety disorder: Secondary | ICD-10-CM | POA: Diagnosis not present

## 2021-08-12 DIAGNOSIS — Z20822 Contact with and (suspected) exposure to covid-19: Secondary | ICD-10-CM | POA: Insufficient documentation

## 2021-08-12 DIAGNOSIS — N184 Chronic kidney disease, stage 4 (severe): Secondary | ICD-10-CM | POA: Insufficient documentation

## 2021-08-12 DIAGNOSIS — E039 Hypothyroidism, unspecified: Secondary | ICD-10-CM | POA: Diagnosis not present

## 2021-08-12 DIAGNOSIS — F0390 Unspecified dementia without behavioral disturbance: Secondary | ICD-10-CM | POA: Diagnosis present

## 2021-08-12 DIAGNOSIS — G9389 Other specified disorders of brain: Secondary | ICD-10-CM | POA: Diagnosis not present

## 2021-08-12 DIAGNOSIS — Z79899 Other long term (current) drug therapy: Secondary | ICD-10-CM | POA: Diagnosis not present

## 2021-08-12 DIAGNOSIS — Z96652 Presence of left artificial knee joint: Secondary | ICD-10-CM | POA: Diagnosis not present

## 2021-08-12 DIAGNOSIS — Z7901 Long term (current) use of anticoagulants: Secondary | ICD-10-CM | POA: Diagnosis not present

## 2021-08-12 DIAGNOSIS — I639 Cerebral infarction, unspecified: Secondary | ICD-10-CM | POA: Diagnosis not present

## 2021-08-12 DIAGNOSIS — Y900 Blood alcohol level of less than 20 mg/100 ml: Secondary | ICD-10-CM | POA: Insufficient documentation

## 2021-08-12 DIAGNOSIS — I6602 Occlusion and stenosis of left middle cerebral artery: Secondary | ICD-10-CM | POA: Diagnosis not present

## 2021-08-12 DIAGNOSIS — I6523 Occlusion and stenosis of bilateral carotid arteries: Secondary | ICD-10-CM | POA: Diagnosis not present

## 2021-08-12 DIAGNOSIS — J9811 Atelectasis: Secondary | ICD-10-CM | POA: Diagnosis not present

## 2021-08-12 DIAGNOSIS — R401 Stupor: Secondary | ICD-10-CM | POA: Diagnosis not present

## 2021-08-12 DIAGNOSIS — I129 Hypertensive chronic kidney disease with stage 1 through stage 4 chronic kidney disease, or unspecified chronic kidney disease: Secondary | ICD-10-CM | POA: Diagnosis not present

## 2021-08-12 DIAGNOSIS — I672 Cerebral atherosclerosis: Secondary | ICD-10-CM | POA: Diagnosis not present

## 2021-08-12 DIAGNOSIS — R569 Unspecified convulsions: Secondary | ICD-10-CM | POA: Diagnosis not present

## 2021-08-12 DIAGNOSIS — U071 COVID-19: Secondary | ICD-10-CM | POA: Diagnosis not present

## 2021-08-12 DIAGNOSIS — R0602 Shortness of breath: Secondary | ICD-10-CM | POA: Diagnosis not present

## 2021-08-12 DIAGNOSIS — R4182 Altered mental status, unspecified: Secondary | ICD-10-CM | POA: Diagnosis not present

## 2021-08-12 LAB — CBC
HCT: 42.6 % (ref 36.0–46.0)
Hemoglobin: 13.9 g/dL (ref 12.0–15.0)
MCH: 30 pg (ref 26.0–34.0)
MCHC: 32.6 g/dL (ref 30.0–36.0)
MCV: 91.8 fL (ref 80.0–100.0)
Platelets: 318 10*3/uL (ref 150–400)
RBC: 4.64 MIL/uL (ref 3.87–5.11)
RDW: 15 % (ref 11.5–15.5)
WBC: 8.2 10*3/uL (ref 4.0–10.5)
nRBC: 0 % (ref 0.0–0.2)

## 2021-08-12 LAB — CBC WITH DIFFERENTIAL/PLATELET
Abs Immature Granulocytes: 0.02 10*3/uL (ref 0.00–0.07)
Basophils Absolute: 0 10*3/uL (ref 0.0–0.1)
Basophils Relative: 1 %
Eosinophils Absolute: 0 10*3/uL (ref 0.0–0.5)
Eosinophils Relative: 0 %
HCT: 38.8 % (ref 36.0–46.0)
Hemoglobin: 13.1 g/dL (ref 12.0–15.0)
Immature Granulocytes: 0 %
Lymphocytes Relative: 19 %
Lymphs Abs: 1.3 10*3/uL (ref 0.7–4.0)
MCH: 30.9 pg (ref 26.0–34.0)
MCHC: 33.8 g/dL (ref 30.0–36.0)
MCV: 91.5 fL (ref 80.0–100.0)
Monocytes Absolute: 0.6 10*3/uL (ref 0.1–1.0)
Monocytes Relative: 8 %
Neutro Abs: 5.2 10*3/uL (ref 1.7–7.7)
Neutrophils Relative %: 72 %
Platelets: 314 10*3/uL (ref 150–400)
RBC: 4.24 MIL/uL (ref 3.87–5.11)
RDW: 15 % (ref 11.5–15.5)
WBC: 7.2 10*3/uL (ref 4.0–10.5)
nRBC: 0 % (ref 0.0–0.2)

## 2021-08-12 LAB — URINE DRUG SCREEN, QUALITATIVE (ARMC ONLY)
Amphetamines, Ur Screen: NOT DETECTED
Barbiturates, Ur Screen: NOT DETECTED
Benzodiazepine, Ur Scrn: POSITIVE — AB
Cannabinoid 50 Ng, Ur ~~LOC~~: NOT DETECTED
Cocaine Metabolite,Ur ~~LOC~~: NOT DETECTED
MDMA (Ecstasy)Ur Screen: NOT DETECTED
Methadone Scn, Ur: NOT DETECTED
Opiate, Ur Screen: NOT DETECTED
Phencyclidine (PCP) Ur S: NOT DETECTED
Tricyclic, Ur Screen: NOT DETECTED

## 2021-08-12 LAB — TROPONIN I (HIGH SENSITIVITY)
Troponin I (High Sensitivity): 8 ng/L (ref <18)
Troponin I (High Sensitivity): 8 ng/L (ref <18)

## 2021-08-12 LAB — COMPREHENSIVE METABOLIC PANEL
ALT: 31 U/L (ref 0–44)
AST: 37 U/L (ref 15–41)
Albumin: 4.1 g/dL (ref 3.5–5.0)
Alkaline Phosphatase: 71 U/L (ref 38–126)
Anion gap: 12 (ref 5–15)
BUN: 11 mg/dL (ref 8–23)
CO2: 22 mmol/L (ref 22–32)
Calcium: 9.8 mg/dL (ref 8.9–10.3)
Chloride: 101 mmol/L (ref 98–111)
Creatinine, Ser: 0.71 mg/dL (ref 0.44–1.00)
GFR, Estimated: >60 mL/min (ref >60)
Glucose, Bld: 110 mg/dL — ABNORMAL HIGH (ref 70–99)
Potassium: 3.7 mmol/L (ref 3.5–5.1)
Sodium: 135 mmol/L (ref 135–145)
Total Bilirubin: 0.9 mg/dL (ref 0.3–1.2)
Total Protein: 7.3 g/dL (ref 6.5–8.1)

## 2021-08-12 LAB — BLOOD GAS, VENOUS
Acid-Base Excess: 4.6 mmol/L — ABNORMAL HIGH (ref 0.0–2.0)
Bicarbonate: 26 mmol/L (ref 20.0–28.0)
O2 Saturation: 96 %
Patient temperature: 37
pCO2, Ven: 29 mmHg — ABNORMAL LOW (ref 44.0–60.0)
pH, Ven: 7.56 — ABNORMAL HIGH (ref 7.250–7.430)
pO2, Ven: 70 mmHg — ABNORMAL HIGH (ref 32.0–45.0)

## 2021-08-12 LAB — RESP PANEL BY RT-PCR (FLU A&B, COVID) ARPGX2
Influenza A by PCR: NEGATIVE
Influenza B by PCR: NEGATIVE
SARS Coronavirus 2 by RT PCR: NEGATIVE

## 2021-08-12 LAB — APTT: aPTT: 23 seconds — ABNORMAL LOW (ref 24–36)

## 2021-08-12 LAB — VITAMIN B12: Vitamin B-12: 572 pg/mL (ref 180–914)

## 2021-08-12 LAB — CBG MONITORING, ED: Glucose-Capillary: 99 mg/dL (ref 70–99)

## 2021-08-12 LAB — ACETAMINOPHEN LEVEL: Acetaminophen (Tylenol), Serum: <10 ug/mL — ABNORMAL LOW (ref 10–30)

## 2021-08-12 LAB — PROTIME-INR
INR: 1.4 — ABNORMAL HIGH (ref 0.8–1.2)
Prothrombin Time: 17.6 seconds — ABNORMAL HIGH (ref 11.4–15.2)

## 2021-08-12 LAB — TSH: TSH: 1.138 u[IU]/mL (ref 0.350–4.500)

## 2021-08-12 LAB — AMMONIA: Ammonia: <9 umol/L — ABNORMAL LOW (ref 9–35)

## 2021-08-12 LAB — ETHANOL: Alcohol, Ethyl (B): <10 mg/dL (ref <10)

## 2021-08-12 LAB — SALICYLATE LEVEL: Salicylate Lvl: <7 mg/dL — ABNORMAL LOW (ref 7.0–30.0)

## 2021-08-12 LAB — T4, FREE: Free T4: 1.2 ng/dL — ABNORMAL HIGH (ref 0.61–1.12)

## 2021-08-12 MED ORDER — IOHEXOL 350 MG/ML SOLN
75.0000 mL | Freq: Once | INTRAVENOUS | Status: AC | PRN
  Administered 2021-08-12: 75 mL via INTRAVENOUS

## 2021-08-12 MED ORDER — MIDAZOLAM HCL (PF) 5 MG/ML IJ SOLN
5.0000 mg | Freq: Once | INTRAMUSCULAR | Status: DC
  Filled 2021-08-12: qty 1

## 2021-08-12 MED ORDER — LORAZEPAM 2 MG/ML IJ SOLN: 2.0000 mg | INTRAMUSCULAR | Status: DC | PRN

## 2021-08-12 MED ORDER — MIDAZOLAM HCL 5 MG/5ML IJ SOLN
INTRAMUSCULAR | Status: AC
  Filled 2021-08-12: qty 5

## 2021-08-12 MED ORDER — LORAZEPAM 2 MG/ML IJ SOLN
1.0000 mg | INTRAMUSCULAR | Status: DC | PRN
  Administered 2021-08-12 – 2021-08-13 (×4): 1 mg via INTRAVENOUS
  Filled 2021-08-12 (×4): qty 1

## 2021-08-12 MED ORDER — LORAZEPAM 2 MG/ML IJ SOLN: 2.0000 mg | Freq: Once | INTRAMUSCULAR | Status: DC

## 2021-08-12 NOTE — Code Documentation (Signed)
Stroke Response Nurse Documentation Code Documentation  Erin Sutton is a 80 y.o. female arriving to [facility] ED via [facility] on 08/12/2021.  On Eliquis (apixaban) daily. Code stroke was activated by ED.   Patient from home where she was LKW at 1430. Pt called EMS herself and stated that she was feeling weak. Pt arrived to ED and sent to triage where she then became less responsive and had some jerky movements. Pt was taken to room 26 and evaluated by MD and Code stroke was called.    Stroke team at the bedside on patient arrival to room. Labs drawn and patient cleared for CT by Dr. Jari Pigg. Patient to CT with team. NIHSS 21, see documentation for details and code stroke times. The following imaging was completed:  CTH, CTA head and neck. Patient is not a candidate for IV Thrombolytic per Dr Cheral Marker.  Bedside handoff with ED RN Caryl Pina.    Velta Addison Stroke Response RN

## 2021-08-12 NOTE — ED Notes (Signed)
Pt pre-medicated for MRI

## 2021-08-12 NOTE — ED Notes (Signed)
Lab at bedside

## 2021-08-12 NOTE — ED Notes (Signed)
Pt soiled with urine at this time. Brief and bed pan changed. Pt placed on a purewick.

## 2021-08-12 NOTE — Progress Notes (Signed)
Chaplain Maggie responded to code stroke to ED26. Pt was at CT. Chaplain available for support at [phone].

## 2021-08-12 NOTE — ED Notes (Signed)
CODE  STROKE  CALLED  TO  CAREL[facility]  

## 2021-08-12 NOTE — ED Triage Notes (Signed)
Per ems patient here for weakness.  Says she was awake and able to get on stretcher at home.  When they arrived here about 10  min ago she became less responsive.  She is currently snoring resp and some jerky movements.  She can look at me when I call her name, but she cannot answer me verbally.  Her fsbs was 109 by ems.  She was hypertensive. She told them she took her meds today.

## 2021-08-12 NOTE — ED Notes (Signed)
Pt much more responsive now pt is able to speak in short phrases pt states, that she did not have a fall. She states that she came in because she was tired and this happens every so often. Pt denies narcotic use. Pt was coughing up sputum and O2 saturation dropped to 86%, pt placed on 2L [place] and now stat 98%. Dr. Jari Pigg MD made aware.   Pt is now alert to voice when previously before she went to CT pt was unresponsive.  Dr Jari Pigg at bedside.

## 2021-08-12 NOTE — ED Notes (Signed)
Pt taken to CT again.

## 2021-08-12 NOTE — Patient Outreach (Signed)
[facility]) Care Management  08/12/2021  Erin Sutton 29-Dec-1940 [id number]  CSW was able to connect with pt today and she shared she had been hospitalized and released 9/10/ for BP issues. She is also complaining about high BP this morning and CSW encouraged her to call her PCP's office to make them aware.   CSW inquired with pt about the referral CSW placed for mental health counseling to which she says she has not followed up on.  Pt wants to wait until after she has her TKR on 08/21/21.   CSW offered to call her post knee surgery in a few weeks to further discuss.  [name], MSW, [place] [facility] Worker  [place] [phone]

## 2021-08-12 NOTE — Procedures (Signed)
Patient Name: Erin Sutton  MRN: [id number]  Epilepsy Attending: Lora Havens  Referring Physician/Provider: Dr Kerney Elbe Date: 08/12/2021 Duration: 22.14 mins  Patient history:  79 year old female with acute change in level of consciousness, in conjunction with low amplitude myoclonic twitching. EEG to evaluate for seizure  Level of alertness: Awake, asleep  AEDs during EEG study: Versed  Technical aspects: This EEG study was done with scalp electrodes positioned according to the 10-20 International system of electrode placement. Electrical activity was acquired at a sampling rate of 500Hz  and reviewed with a high frequency filter of 70Hz  and a low frequency filter of 1Hz . EEG data were recorded continuously and digitally stored.   Description: The posterior dominant rhythm consists of 9 Hz activity of moderate voltage (25-35 uV) seen predominantly in posterior head regions, symmetric and reactive to eye opening and eye closing. Sleep was characterized by vertex waves, sleep spindles (12 to 14 Hz), maximal frontocentral region.  Physiologic photic driving was seen during photic stimulation. Hyperventilation was not performed.     IMPRESSION: This study is within normal limits. No seizures or epileptiform discharges were seen throughout the recording.  Erin Sutton [name]
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
## 2021-08-12 NOTE — ED Notes (Signed)
Pt to MRI

## 2021-08-12 NOTE — ED Notes (Signed)
Called lab to obtain blood work at this time.  

## 2021-08-12 NOTE — ED Notes (Signed)
Pt taken to CT.

## 2021-08-12 NOTE — ED Notes (Signed)
EEG at bedside.

## 2021-08-12 NOTE — Progress Notes (Signed)
Eeg done 

## 2021-08-12 NOTE — Consult Note (Addendum)
NEURO HOSPITALIST CONSULT NOTE   Requestig physician: Dr. Jari Pigg  Reason for Consult:Acute onset of altered level of consciousness  History obtained from:  Chart and EMS Communication to Staff  HPI:                                                                                                                                          Erin Sutton is an 80 y.o. female who presented acutely to the ED via EMS after she called 911 to report diffuse weakness. While in Triage she became acutely unresponsive with twitching movements and a Code Stroke was called.   The patient's past medical history as outlined below has been reviewed.   Home medications include apixaban.   Past Medical History:  Diagnosis Date   Anxiety    Arthritis    Chronic kidney disease    Constipation    GERD (gastroesophageal reflux disease)    Hypertension    Hypothyroidism    Osteoporosis    Sleep apnea    Urinary incontinence     Past Surgical History:  Procedure Laterality Date   BREAST BIOPSY Right    neg/stereo   BREAST BIOPSY Left    neg/stereo   BREAST EXCISIONAL BIOPSY Right    neg   CATARACT EXTRACTION, BILATERAL     COLONOSCOPY WITH PROPOFOL N/A 07/02/2015   Procedure: COLONOSCOPY WITH PROPOFOL;  Surgeon: Manya Silvas, MD;  Location: [facility];  Service: Endoscopy;  Laterality: N/A;   CORRECTION HAMMER TOE Right    ESOPHAGOGASTRODUODENOSCOPY (EGD) WITH PROPOFOL N/A 07/02/2015   Procedure: ESOPHAGOGASTRODUODENOSCOPY (EGD) WITH PROPOFOL;  Surgeon: Manya Silvas, MD;  Location: [facility] ENDOSCOPY;  Service: Endoscopy;  Laterality: N/A;   JOINT REPLACEMENT Left    knee   REPLACEMENT TOTAL KNEE Left    SKIN BIOPSY Right     Family History  Problem Relation Age of Onset   Hypertension Mother    Diabetes Mother    Heart attack Mother    Asthma Mother    Peripheral vascular disease Mother    Heart attack Father    Hypertension Father    Diabetes Father    Colon  cancer Maternal Aunt    Breast cancer Cousin             Social History:  reports that she has never smoked. She has never used smokeless tobacco. She reports that she does not drink alcohol and does not use drugs.  Allergies  Allergen Reactions   Ace Inhibitors Cough   Levofloxacin    Penicillin G Other (See Comments)   Tape Other (See Comments)    Other reaction(s): Other (See Comments)   Amoxicillin Rash    Has patient had a PCN reaction causing immediate rash, facial/tongue/throat swelling, SOB  or lightheadedness with hypotension: No Has patient had a PCN reaction causing severe rash involving mucus membranes or skin necrosis: No Has patient had a PCN reaction that required hospitalization: No Has patient had a PCN reaction occurring within the last 10 years: No If all of the above answers are "NO", then may proceed with Cephalosporin use.    Benicar [Olmesartan] Rash    Itching rash   Codeine Rash   Codeine Sulfate Rash   Levaquin [Levofloxacin In D5w] Rash    Alters mental status   Penicillin V Potassium Rash    Has patient had a PCN reaction causing immediate rash, facial/tongue/throat swelling, SOB or lightheadedness with hypotension: No Has patient had a PCN reaction causing severe rash involving mucus membranes or skin necrosis: No Has patient had a PCN reaction that required hospitalization: No Has patient had a PCN reaction occurring within the last 10 years: No If all of the above answers are "NO", then may proceed with Cephalosporin use.    Sulfa Antibiotics Rash    MEDICATIONS:                                                                                                                     No current facility-administered medications on file prior to encounter.   Current Outpatient Medications on File Prior to Encounter  Medication Sig Dispense Refill   acetaminophen (TYLENOL) 500 MG tablet Take 500 mg by mouth daily as needed.     amLODipine (NORVASC) 10 MG  tablet Take 10 mg by mouth daily.     apixaban (ELIQUIS) 5 MG TABS tablet Take 5 mg by mouth in the morning and at bedtime.     atorvastatin (LIPITOR) 40 MG tablet Take 1 tablet by mouth daily.     cyanocobalamin (,VITAMIN B-12,) 1000 MCG/ML injection Inject 1,000 mcg into the muscle See admin instructions. Inject 1086mcg once weekly for 4 weeks then recheck by Dr Wynetta Emery     fluticasone [facility]) 50 MCG/ACT nasal spray Place 1 spray into both nostrils daily.     levothyroxine (SYNTHROID, LEVOTHROID) 50 MCG tablet Take 50 mcg by mouth daily before breakfast.     LORazepam (ATIVAN) 0.5 MG tablet Take 1 tablet (0.5 mg total) by mouth 3 (three) times daily as needed. 15 tablet 0   ondansetron (ZOFRAN ODT) 8 MG disintegrating tablet Take 1 tablet (8 mg total) by mouth every 8 (eight) hours as needed for nausea or vomiting. 20 tablet 0   pantoprazole (PROTONIX) 40 MG tablet Take 40 mg by mouth daily.     Polyethyl Glycol-Propyl Glycol (SYSTANE OP) Place 1 drop into both eyes 2 (two) times daily.     polyethylene glycol powder (GLYCOLAX/MIRALAX) 17 GM/SCOOP powder Take 17 g by mouth daily.     senna-docusate (SENOKOT-S) 8.6-50 MG tablet Take 2 tablets by mouth 2 (two) times daily as needed for mild constipation. 100 tablet 0   spironolactone (ALDACTONE) 25 MG tablet Take 25 mg by mouth daily.  traZODone (DESYREL) 50 MG tablet Take 50 mg by mouth at bedtime.       ROS:                                                                                                                                       Unable to obtain due to AMS.   Blood pressure (!) 181/85, pulse 96, temperature 98.9 F (37.2 C), resp. rate 14, SpO2 97 %.   General Examination:                                                                                                       Physical Exam  HEENT-  [place]/AT.  She is afebrile without nuchal rigidity. Lungs-Respirations unlabored Extremities- No pallor or  cyanosis  Neurological Examination Mental Status:  At the start of the assessment, the patient is in an awake state with eyes open but confused and poorly responsive, alternating with obtundation. The patient was also noted to be drooling with inanition at the start of the examination. At other times she had eyelids nearly completely closed. She did scream/yell consistently to bilateral noxious leg pinch and nipple-twist (by RN), but no response to noxious plantar stimulation and little movement to sternal rub. Not following commands. Did utter the word "neighbor" with a stutter when RN said that it was the patient who called EMS. Otherwise with no intelligible speech output. Did not visually fixate or track. No purposeful movements except for semipurposeful BUE movements to leg pinch.  Cranial Nerves: II: PERRL 3 mm >> 2 mm. Blinks to threat bilaterally.  III,IV, VI: Doll's eye reflex appears to be supressed. Eyes are conjugate at the midline. There is intermittent low amplitude synchronous up-beating nystagmus. V: No response to brow ridge pressure bilaterally.  VII: Face is symmetric, including when when she utters incomprehensible sounds, with lower quadrants of face moving equally.  VIII: No response to loud clap but did Brewing technologist commenting on the call to EMS and corrected her IX,X: Unable to assess XI: Head is midline XII: Speech is dysartric Motor/Sensory: Flaccid tone x 4 without posturing. No asymmetry. No movement to noxious except for semipurposeful movement of upper extremities to leg pinch. No response to arm pinch bilaterally  Deep Tendon Reflexes: Trace to 1+ and symmetric throughout Plantars: Right: downgoing   Left: downgoing Cerebellar/Gait: Unable to assess    Lab Results: Basic Metabolic Panel: No results for input(s): NA, K, CL, CO2, GLUCOSE, BUN, CREATININE, CALCIUM, MG, PHOS  in the last 168 hours.  CBC: Recent Labs  Lab 08/12/21 1509  WBC 8.2  HGB 13.9  HCT  42.6  MCV 91.8  PLT 318    Cardiac Enzymes: No results for input(s): CKTOTAL, CKMB, CKMBINDEX, TROPONINI in the last 168 hours.  Lipid Panel: No results for input(s): CHOL, TRIG, HDL, CHOLHDL, VLDL, LDLCALC in the last 168 hours.  Imaging: CT HEAD CODE STROKE WO CONTRAST  Result Date: 08/12/2021 CLINICAL DATA:  Code stroke. Neuro deficit, acute, stroke suspected. Weakness. Decreased responsiveness. EXAM: CT HEAD WITHOUT CONTRAST TECHNIQUE: Contiguous axial images were obtained from the base of the skull through the vertex without intravenous contrast. COMPARISON:  06/29/2021 FINDINGS: Brain: No focal abnormality affects the brainstem or cerebellum. 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 No sign of acute infarction, mass lesion, hemorrhage, hydrocephalus or extra-axial collection. Vascular: There is atherosclerotic calcification of the major vessels at the base of the brain. Skull: Negative Sinuses/Orbits: Clear/normal Other: None ASPECTS ([place] Stroke Program Early CT Score) - Ganglionic level infarction (caudate, lentiform nuclei, internal capsule, insula, M1-M3 cortex): 7 - Supraganglionic infarction (M4-M6 cortex): 3 Total score (0-10 with 10 being normal): 10 IMPRESSION: 1. No acute CT finding. 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 2. ASPECTS is 10. 3. These results were called by telephone at the time of interpretation on 08/12/2021 at 3:21 pm to provider [facility] , who verbally acknowledged these results. Electronically Signed   By: Nelson Chimes M.D.   On: 08/12/2021 15:23     Assessment: 80 year old female with acute change in level of consciousness, in conjunction with low amplitude myoclonic twitching.  1. Exam reveals an awake state with eyes open but confused and poorly responsive, alternating with  obtundation. The patient was noted to be drooling with inanition at the start of the examination. Intermittent spasmodic low-amplitude simultaneous adductor movements at hips resembling myoclonus is seen, in addition to intermittent low amplitude synchronous up-beating nystagmus. She is afebrile without nuchal rigidity.  2. STAT CT head reveals no acute finding. Chronic small-vessel ischemic change of the cerebral hemispheric white matter is noted. 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 ASPECTS is 10. 3. Differential diagnosis for her presentation is broad, including acute seizure, acute basilar artery thrombosis, opiate overdose, other drug overdose, conversion disorder, factitious disorder, hypercarbia and metabolic encephalopathy.   Recommendations: 1. STAT CTA of head and neck to rule out basilar artery thrombosis 2. STAT EEG has been ordered and technician notified.  3. Versed 5 mg IM STAT   Addendum: - Radiology wet read on CTA: No LVO  Addendum: - EEG is normal.    Electronically signed: Dr. Kerney Elbe 08/12/2021, 3:29 PM

## 2021-08-12 NOTE — ED Notes (Signed)
Neurologist at bedside. 

## 2021-08-12 NOTE — ED Notes (Signed)
Lab called at this time for blood work

## 2021-08-12 NOTE — H&P (Signed)
[facility] History and Physical  [name] IRS:[id number] DOB: 1941/11/14 DOA: 08/12/2021  Referring physician: ED  PCP: Baxter Hire, MD   Patient is coming from: Home  Chief Complaint: Weakness, altered mental status  HPI: SHELISE Sutton is a 80 y.o. female with past medical history of anxiety, chronic kidney disease, GERD, hypertension, hypothyroidism, sleep apnea presented to hospital after being called for generalized weakness.  She also had some mild headache at home.  She lives by her self at home and the neighbor called EMS.  EMS assisted patient in the house when she was able to transfer but while in the ED patient was noted to be acutely nonresponsive and had snoring like episode with twitching of her body and abnormal movements.  Code stroke was called in and neurology saw the patient.  Jenny Reichmann stated that [place] had previous episodes when her blood pressure is elevated.  At home her blood pressure had been elevated as well.  Denies any fever, chills or runny nose.  Patient denies any chest pain, dizziness, lightheadedness.  Denies any urinary urgency, frequency or dysuria.  Patient denies any nausea vomiting or abdominal pain or constipation.  Patient take antihypertensives at home.  Denies recent travel or sick contacts.  ED Course: In the ED, patient received 5 mg of Versed after which her symptoms are gradually started improving.  CT head and CTA of the neck including EEG was ordered.  Patient was then considered for admission to the hospital for further evaluation of seizures and to rule out possible stroke.   Review of Systems:  All systems were reviewed and were negative unless otherwise mentioned in the HPI  Past Medical History:  Diagnosis Date   Anxiety    Arthritis    Chronic kidney disease    Constipation    GERD (gastroesophageal reflux disease)    Hypertension    Hypothyroidism    Osteoporosis    Sleep apnea    Urinary incontinence    Past  Surgical History:  Procedure Laterality Date   BREAST BIOPSY Right    neg/stereo   BREAST BIOPSY Left    neg/stereo   BREAST EXCISIONAL BIOPSY Right    neg   CATARACT EXTRACTION, BILATERAL     COLONOSCOPY WITH PROPOFOL N/A 07/02/2015   Procedure: COLONOSCOPY WITH PROPOFOL;  Surgeon: Manya Silvas, MD;  Location: [facility];  Service: Endoscopy;  Laterality: N/A;   CORRECTION HAMMER TOE Right    ESOPHAGOGASTRODUODENOSCOPY (EGD) WITH PROPOFOL N/A 07/02/2015   Procedure: ESOPHAGOGASTRODUODENOSCOPY (EGD) WITH PROPOFOL;  Surgeon: Manya Silvas, MD;  Location: [facility] ENDOSCOPY;  Service: Endoscopy;  Laterality: N/A;   JOINT REPLACEMENT Left    knee   REPLACEMENT TOTAL KNEE Left    SKIN BIOPSY Right     Social History:  reports that she has never smoked. She has never used smokeless tobacco. She reports that she does not drink alcohol and does not use drugs.  Allergies  Allergen Reactions   Ace Inhibitors Cough   Levofloxacin    Penicillin G Other (See Comments)   Tape Other (See Comments)    Other reaction(s): Other (See Comments)   Amoxicillin Rash    Has patient had a PCN reaction causing immediate rash, facial/tongue/throat swelling, SOB or lightheadedness with hypotension: No Has patient had a PCN reaction causing severe rash involving mucus membranes or skin necrosis: No Has patient had a PCN reaction that required hospitalization: No Has patient had a PCN reaction occurring within the  last 10 years: No If all of the above answers are "NO", then may proceed with Cephalosporin use.    Benicar [Olmesartan] Rash    Itching rash   Codeine Rash   Codeine Sulfate Rash   Levaquin [Levofloxacin In D5w] Rash    Alters mental status   Penicillin V Potassium Rash    Has patient had a PCN reaction causing immediate rash, facial/tongue/throat swelling, SOB or lightheadedness with hypotension: No Has patient had a PCN reaction causing severe rash involving mucus membranes or skin  necrosis: No Has patient had a PCN reaction that required hospitalization: No Has patient had a PCN reaction occurring within the last 10 years: No If all of the above answers are "NO", then may proceed with Cephalosporin use.    Sulfa Antibiotics Rash    Family History  Problem Relation Age of Onset   Hypertension Mother    Diabetes Mother    Heart attack Mother    Asthma Mother    Peripheral vascular disease Mother    Heart attack Father    Hypertension Father    Diabetes Father    Colon cancer Maternal Aunt    Breast cancer Cousin      Prior to Admission medications   Medication Sig Start Date End Date Taking? Authorizing Provider  acetaminophen (TYLENOL) 500 MG tablet Take 500 mg by mouth daily as needed.    [provider]  amLODipine (NORVASC) 10 MG tablet Take 10 mg by mouth daily.    [provider]  apixaban (ELIQUIS) 5 MG TABS tablet Take 5 mg by mouth in the morning and at bedtime.    [provider]  atorvastatin (LIPITOR) 40 MG tablet Take 1 tablet by mouth daily. 02/15/21   [provider]  cyanocobalamin (,VITAMIN B-12,) 1000 MCG/ML injection Inject 1,000 mcg into the muscle See admin instructions. Inject 1085mcg once weekly for 4 weeks then recheck by Dr Wynetta Emery 05/06/21   [provider]  fluticasone (FLONASE) 50 MCG/ACT nasal spray Place 1 spray into both nostrils daily.    [provider]  levothyroxine (SYNTHROID, LEVOTHROID) 50 MCG tablet Take 50 mcg by mouth daily before breakfast.    [provider]  LORazepam (ATIVAN) 0.5 MG tablet Take 1 tablet (0.5 mg total) by mouth 3 (three) times daily as needed. 07/03/21   Ghimire, Henreitta Leber, MD  ondansetron (ZOFRAN ODT) 8 MG disintegrating tablet Take 1 tablet (8 mg total) by mouth every 8 (eight) hours as needed for nausea or vomiting. 05/20/21   Alma Friendly, MD  pantoprazole (PROTONIX) 40 MG tablet Take 40 mg by mouth daily.    [provider]  Polyethyl Glycol-Propyl Glycol (SYSTANE OP) Place 1 drop into both eyes 2 (two) times daily.    [provider]  polyethylene glycol powder (GLYCOLAX/MIRALAX) 17 GM/SCOOP powder Take 17 g by mouth daily. 08/03/20   [provider]  senna-docusate (SENOKOT-S) 8.6-50 MG tablet Take 2 tablets by mouth 2 (two) times daily as needed for mild constipation. 04/15/20   Sharen Hones, MD  spironolactone (ALDACTONE) 25 MG tablet Take 25 mg by mouth daily. 04/04/21   [provider]  traZODone (DESYREL) 50 MG tablet Take 50 mg by mouth at bedtime. 05/09/21   [provider]    Physical Exam: Vitals:   08/12/21 1447 08/12/21 1600 08/12/21 1630  BP: (!) 181/85 (!) 159/88 (!) 179/84  Pulse: 96 91 90  Resp: 14 20 17   Temp: 98.9 F (37.2  C)    SpO2: 97% 99% 98%   Wt Readings from Last 3 Encounters:  06/29/21 74.8 kg  05/12/21 72.1 kg  02/27/21 80 kg   There is no height or weight on file to calculate BMI.  General:   not in obvious distress, slow to respond.  Appears mildly sleepy HENT: Normocephalic, pupils equally reacting to light and accommodation.  No scleral pallor or icterus noted. Oral mucosa is mildly dry. Chest:  Clear breath sounds.  Diminished breath sounds bilaterally. No crackles or wheezes.  CVS: S1 &S2 heard. egular rate and rhythm. Abdomen: Soft, nontender, nondistended.  Bowel sounds are heard.  Liver is not palpable, no abdominal mass palpated Extremities: No cyanosis, clubbing or edema.  Peripheral pulses are palpable. Psych: Mildly sleepy but able to verbalize, slow to respond, oriented to place and person. CNS: Mildly somnolent but able to verbalize.  Recent testing.  Moves extremities but generalized weakness noted over the extremities with. Skin: Warm and dry.  No rashes noted.  Labs on Admission:   CBC: Recent Labs  Lab 08/12/21 1509 08/12/21 1545  WBC 8.2 7.2  NEUTROABS  --  5.2  HGB 13.9 13.1  HCT 42.6 38.8  MCV 91.8 91.5   PLT 318 562    Basic Metabolic Panel: Recent Labs  Lab 08/12/21 1540  NA 135  K 3.7  CL 101  CO2 22  GLUCOSE 110*  BUN 11  CREATININE 0.71  CALCIUM 9.8    Liver Function Tests: Recent Labs  Lab 08/12/21 1540  AST 37  ALT 31  ALKPHOS 71  BILITOT 0.9  PROT 7.3  ALBUMIN 4.1   No results for input(s): LIPASE, AMYLASE in the last 168 hours. Recent Labs  Lab 08/12/21 1510  AMMONIA <9*    Cardiac Enzymes: No results for input(s): CKTOTAL, CKMB, CKMBINDEX, TROPONINI in the last 168 hours.  BNP (last 3 results) Recent Labs    06/29/21 1003  BNP 24.2    ProBNP (last 3 results) No results for input(s): PROBNP in the last 8760 hours.  CBG: Recent Labs  Lab 08/12/21 1503  GLUCAP 99    Lipase     Component Value Date/Time   LIPASE 27 05/12/2021 1546     Urinalysis    Component Value Date/Time   COLORURINE COLORLESS (A) 06/29/2021 1003   APPEARANCEUR CLEAR (A) 06/29/2021 1003   APPEARANCEUR Hazy 07/25/2014 1350   LABSPEC 1.005 06/29/2021 1003   LABSPEC 1.020 07/25/2014 1350   PHURINE 9.0 (H) 06/29/2021 1003   GLUCOSEU NEGATIVE 06/29/2021 1003   GLUCOSEU Negative 07/25/2014 1350   HGBUR SMALL (A) 06/29/2021 1003   BILIRUBINUR NEGATIVE 06/29/2021 1003   BILIRUBINUR Negative 07/25/2014 1350   KETONESUR 5 (A) 06/29/2021 1003   PROTEINUR NEGATIVE 06/29/2021 1003   NITRITE NEGATIVE 06/29/2021 1003   LEUKOCYTESUR NEGATIVE 06/29/2021 1003   LEUKOCYTESUR 2+ 07/25/2014 1350     Drugs of Abuse  No results found for: LABOPIA, COCAINSCRNUR, LABBENZ, AMPHETMU, THCU, LABBARB    Radiological Exams on Admission: DG Chest Portable 1 View  Result Date: 08/12/2021 CLINICAL DATA:  Shortness of breath EXAM: PORTABLE CHEST 1 VIEW COMPARISON:  06/29/2021 FINDINGS: Check shadow is within normal limits. Aortic calcifications are noted. The lungs are well aerated bilaterally. Mild atelectatic changes are noted in the left lung base. No acute bony abnormality is noted.  IMPRESSION: Mild left basilar atelectasis.  No acute focal infiltrate is seen. Electronically Signed   By: Inez Catalina M.D.   On: 08/12/2021  16:51   CT HEAD CODE STROKE WO CONTRAST  Result Date: 08/12/2021 CLINICAL DATA:  Code stroke. Neuro deficit, acute, stroke suspected. Weakness. Decreased responsiveness. EXAM: CT HEAD WITHOUT CONTRAST TECHNIQUE: Contiguous axial images were obtained from the base of the skull through the vertex without intravenous contrast. COMPARISON:  06/29/2021 FINDINGS: Brain: No focal abnormality affects the brainstem or cerebellum. 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 No sign of acute infarction, mass lesion, hemorrhage, hydrocephalus or extra-axial collection. Vascular: There is atherosclerotic calcification of the major vessels at the base of the brain. Skull: Negative Sinuses/Orbits: Clear/normal Other: None ASPECTS ([place] Stroke Program Early CT Score) - Ganglionic level infarction (caudate, lentiform nuclei, internal capsule, insula, M1-M3 cortex): 7 - Supraganglionic infarction (M4-M6 cortex): 3 Total score (0-10 with 10 being normal): 10 IMPRESSION: 1. No acute CT finding. 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 2. ASPECTS is 10. 3. These results were called by telephone at the time of interpretation on 08/12/2021 at 3:21 pm to provider [facility] , who verbally acknowledged these results. Electronically Signed   By: Nelson Chimes M.D.   On: 08/12/2021 15:23   CT ANGIO HEAD NECK W WO CM (CODE STROKE)  Result Date: 08/12/2021 CLINICAL DATA:  Neuro deficit, acute, stroke suspected EXAM: CT ANGIOGRAPHY HEAD AND NECK TECHNIQUE: Multidetector CT imaging of the head and neck was performed using the standard protocol during bolus administration of intravenous contrast. Multiplanar CT image  reconstructions and MIPs were obtained to evaluate the vascular anatomy. Carotid stenosis measurements (when applicable) are obtained utilizing NASCET criteria, using the distal internal carotid diameter as the denominator. CONTRAST:  13mL OMNIPAQUE IOHEXOL 350 MG/ML SOLN COMPARISON:  None. FINDINGS: CTA NECK Aortic arch: Calcified plaque along the arch and patent great vessel origins. No high-grade proximal subclavian stenosis. Right carotid system: Patent. Calcified plaque at the common carotid bifurcation and proximal internal carotid with less than 50% stenosis. Left carotid system: Patent. Calcified plaque at the bifurcation and proximal internal carotid without stenosis. Vertebral arteries: Patent and codominant.  No stenosis. Skeleton: Degenerative changes primarily at C5-C6. Degenerative changes at the temporomandibular joints. Other neck: Unremarkable. Upper chest: Included upper lungs are clear. Review of the MIP images confirms the above findings CTA HEAD Anterior circulation: Intracranial internal carotid arteries are patent with calcified plaque but no significant stenosis. Anterior and middle cerebral arteries are patent. There is short segment moderate stenosis proximal left M2 MCA branch. Posterior circulation: Intracranial vertebral arteries are patent. Basilar artery is patent. Major cerebellar artery origins are patent. Right posterior communicating artery is present. Posterior cerebral arteries are patent Venous sinuses: Patent as allowed by contrast bolus timing. Review of the MIP images confirms the above findings IMPRESSION: No large vessel occlusion hemodynamically significant stenosis, or evidence dissection. 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 These results were called by telephone at the time of interpretation on 08/12/2021 at 3:53 pm to provider ER[facility] , who verbally acknowledged these results. Electronically Signed   By: Macy Mis M.D.   On: 08/12/2021  16:06    EKG: Personally reviewed by me which shows normal sinus rhythm, right bundle branch block  Assessment/Plan Principal Problem:   AMS (altered mental status) Active Problems:   HTN (hypertension)   Accelerated hypertension   Hyperlipidemia   Dementia (HCC)   Acute metabolic encephalopathy   Altered mental status likely  metabolic encephalopathy secondary to postictal state from possible seizures with.  Rule out stroke.  Check MRI of the brain.  CT scan of the head and CTA of the neck without any acute findings.  Neurology has seen the patient.  We will place him for observation overnight.  Per the patient on seizure precautions.  Tylenol and acetaminophen levels were low.  Alcohol level less than 10.  Ammonia was less than 9.  TSH at 1.1.  No evidence of leukocytosis or fever.  Venous blood gas was noted with pH of 7.5 and PCO2 29.  Blood glucose level was 99.  We will continue seizure precautions.  Neurochecks.  Check EEG.  Patient is mildly closed with so we will need to evaluate during MRI.   Generalized weakness/history of falls primary concern.  We will get PT OT evaluation while in the hospital   Accelerated hypertension on presentation.  We will continue to monitor closely.  On amlodipine, spironolactone at home we will continue for now.  Hypothyroidism.  Continue Synthroid.  Anxiety disorder patient takes Ativan at home.  We will put as needed Ativan here   History of pulmonary embolism continue apixaban  DVT Prophylaxis: Lovenox subcu  Consultant: Neurology  Code Status: Full code  Microbiology none  Antibiotics: None  Family Communication:  Patients' condition and plan of care including tests being ordered have been discussed with the patient  who indicate understanding and agree with the plan.  I tried to reach the patient's daughter on the phone but was unable to reach her   Status is: Observation  The patient remains OBS appropriate and will d/c before 2  midnights.  Dispo: The patient is from: Home              Anticipated d/c is to: Home              Patient currently is not medically stable to d/c.   Difficult to place patient No    Severity of Illness: The appropriate patient status for this patient is OBSERVATION. Observation status is judged to be reasonable and necessary in order to provide the required intensity of service to ensure the patient's safety. The patient's presenting symptoms, physical exam findings, and initial radiographic and laboratory data in the context of their medical condition is felt to place them at decreased risk for further clinical deterioration. Furthermore, it is anticipated that the patient will be medically stable for discharge from the hospital within 2 midnights of admission.   Signed, Flora Lipps, MD [facility] 08/12/2021

## 2021-08-13 ENCOUNTER — Encounter: Payer: Self-pay | Admitting: Internal Medicine

## 2021-08-13 ENCOUNTER — Other Ambulatory Visit: Payer: Self-pay

## 2021-08-13 DIAGNOSIS — R4182 Altered mental status, unspecified: Secondary | ICD-10-CM | POA: Diagnosis not present

## 2021-08-13 DIAGNOSIS — R253 Fasciculation: Secondary | ICD-10-CM | POA: Diagnosis not present

## 2021-08-13 LAB — URINALYSIS, COMPLETE (UACMP) WITH MICROSCOPIC
Bacteria, UA: NONE SEEN
Bilirubin Urine: NEGATIVE
Glucose, UA: NEGATIVE mg/dL
Ketones, ur: NEGATIVE mg/dL
Leukocytes,Ua: NEGATIVE
Nitrite: NEGATIVE
Protein, ur: NEGATIVE mg/dL
Specific Gravity, Urine: 1.02 (ref 1.005–1.030)
pH: 8.5 — ABNORMAL HIGH (ref 5.0–8.0)

## 2021-08-13 LAB — CBC
HCT: 40.4 % (ref 36.0–46.0)
Hemoglobin: 13.5 g/dL (ref 12.0–15.0)
MCH: 31.1 pg (ref 26.0–34.0)
MCHC: 33.4 g/dL (ref 30.0–36.0)
MCV: 93.1 fL (ref 80.0–100.0)
Platelets: 291 10*3/uL (ref 150–400)
RBC: 4.34 MIL/uL (ref 3.87–5.11)
RDW: 14.8 % (ref 11.5–15.5)
WBC: 7.8 10*3/uL (ref 4.0–10.5)
nRBC: 0 % (ref 0.0–0.2)

## 2021-08-13 LAB — BASIC METABOLIC PANEL
Anion gap: 11 (ref 5–15)
BUN: 15 mg/dL (ref 8–23)
CO2: 28 mmol/L (ref 22–32)
Calcium: 9.8 mg/dL (ref 8.9–10.3)
Chloride: 99 mmol/L (ref 98–111)
Creatinine, Ser: 0.8 mg/dL (ref 0.44–1.00)
GFR, Estimated: >60 mL/min (ref >60)
Glucose, Bld: 93 mg/dL (ref 70–99)
Potassium: 3.9 mmol/L (ref 3.5–5.1)
Sodium: 138 mmol/L (ref 135–145)

## 2021-08-13 MED ORDER — ONDANSETRON 8 MG PO TBDP
8.0000 mg | ORAL_TABLET | Freq: Three times a day (TID) | ORAL | Status: DC | PRN
  Filled 2021-08-13: qty 1

## 2021-08-13 MED ORDER — APIXABAN 5 MG PO TABS
5.0000 mg | ORAL_TABLET | Freq: Two times a day (BID) | ORAL | Status: DC
  Administered 2021-08-13 – 2021-08-14 (×3): 5 mg via ORAL
  Filled 2021-08-13 (×4): qty 1

## 2021-08-13 MED ORDER — FLUTICASONE PROPIONATE 50 MCG/ACT NA SUSP
1.0000 | Freq: Every day | NASAL | Status: DC
  Filled 2021-08-13: qty 16

## 2021-08-13 MED ORDER — LEVOTHYROXINE SODIUM 50 MCG PO TABS
50.0000 ug | ORAL_TABLET | Freq: Every day | ORAL | Status: DC
  Administered 2021-08-13 – 2021-08-14 (×2): 50 ug via ORAL
  Filled 2021-08-13 (×2): qty 1

## 2021-08-13 MED ORDER — PANTOPRAZOLE SODIUM 40 MG PO TBEC
40.0000 mg | DELAYED_RELEASE_TABLET | Freq: Every day | ORAL | Status: DC
  Administered 2021-08-13 – 2021-08-14 (×2): 40 mg via ORAL
  Filled 2021-08-13 (×2): qty 1

## 2021-08-13 MED ORDER — AMLODIPINE BESYLATE 10 MG PO TABS
10.0000 mg | ORAL_TABLET | Freq: Every day | ORAL | Status: DC
  Administered 2021-08-13 – 2021-08-14 (×2): 10 mg via ORAL
  Filled 2021-08-13: qty 2
  Filled 2021-08-13: qty 1

## 2021-08-13 MED ORDER — POLYVINYL ALCOHOL 1.4 % OP SOLN
1.0000 [drp] | Freq: Two times a day (BID) | OPHTHALMIC | Status: DC
  Administered 2021-08-13 – 2021-08-14 (×3): 1 [drp] via OPHTHALMIC
  Filled 2021-08-13: qty 15

## 2021-08-13 MED ORDER — TRAZODONE HCL 50 MG PO TABS
50.0000 mg | ORAL_TABLET | Freq: Every day | ORAL | Status: DC
  Administered 2021-08-13 (×2): 50 mg via ORAL
  Filled 2021-08-13 (×2): qty 1

## 2021-08-13 MED ORDER — SODIUM CHLORIDE 0.9 % IV SOLN: INTRAVENOUS | Status: AC

## 2021-08-13 MED ORDER — ACETAMINOPHEN 500 MG PO TABS
500.0000 mg | ORAL_TABLET | Freq: Four times a day (QID) | ORAL | Status: DC | PRN
  Administered 2021-08-13 – 2021-08-14 (×2): 500 mg via ORAL
  Filled 2021-08-13 (×2): qty 1

## 2021-08-13 MED ORDER — SPIRONOLACTONE 25 MG PO TABS
25.0000 mg | ORAL_TABLET | Freq: Every day | ORAL | Status: DC
  Administered 2021-08-13 – 2021-08-14 (×2): 25 mg via ORAL
  Filled 2021-08-13 (×2): qty 1

## 2021-08-13 MED ORDER — POLYETHYLENE GLYCOL 3350 17 G PO PACK
17.0000 g | PACK | Freq: Every day | ORAL | Status: DC
  Administered 2021-08-13 – 2021-08-14 (×2): 17 g via ORAL
  Filled 2021-08-13 (×2): qty 1

## 2021-08-13 MED ORDER — ATORVASTATIN CALCIUM 20 MG PO TABS
40.0000 mg | ORAL_TABLET | Freq: Every day | ORAL | Status: DC
  Administered 2021-08-13 – 2021-08-14 (×2): 40 mg via ORAL
  Filled 2021-08-13 (×2): qty 2

## 2021-08-13 MED ORDER — SENNOSIDES-DOCUSATE SODIUM 8.6-50 MG PO TABS: 2.0000 | ORAL_TABLET | Freq: Two times a day (BID) | ORAL | Status: DC | PRN

## 2021-08-13 NOTE — ED Notes (Signed)
Pt resting with RR even and unlabored. Call bell in reach. Stretcher locked in lowest position.

## 2021-08-13 NOTE — Progress Notes (Signed)
PROGRESS NOTE    Erin Sutton  SWN:[id number] DOB: 1941/04/29 DOA: 08/12/2021 PCP: Baxter Hire, MD   Brief Narrative:  This 80 years old female with PMH significant for anxiety, chronic kidney disease, GERD, hypertension, hypothyroidism, sleep apnea presented in the ED with generalized weakness.  In the ED patient was noted to be acutely nonresponsive and had a snoring like episode with twitching of her body and abnormal movements.  Code stroke was called and patient was evaluated by neurology.  Patient has received 5 mg of Versed after which her symptoms gradually improved.  CT head,  CTA head and neck including EEG all unremarkable.  Patient was admitted for further evaluation.  Assessment & Plan:   Principal Problem:   AMS (altered mental status) Active Problems:   HTN (hypertension)   Accelerated hypertension   Hyperlipidemia   Dementia (HCC)   Generalized anxiety disorder   Acute metabolic encephalopathy  Acute metabolic encephalopathy could be secondary to postictal state from possible seizures. Patient was found acutely nonresponsive and had abnormal body movements.   Patient was given Versed which has resolved the symptoms. CTA head and Neck negative for large vessel occlusion or hemodynamically significant stenosis. MRI:  no acute abnormality, EEG :  no evidence of seizures. Continue seizure precautions. Tylenol levels are low,  alcohol level less than 10. TSH and ammonia level normal,  no leukocytosis. Patient was seen by neurology recommended EEG and MRI.  Generalized weakness/recurrent falls: PT and OT eval.  Hypothyroidism :  Continue Synthroid  Anxiety disorder:  Continue Ativan as needed  History of pulmonary embolism :  Continue Eliquis  Essential hypertension :  Continue amlodipine  Hyperlipidemia : continue atorvastatin  DVT prophylaxis: Lovenox Code Status: Full code Family Communication: No family at bedside  Disposition Plan:  Status is:  Observation  The patient remains OBS appropriate and will d/c before 2 midnights.  Dispo: The patient is from: Home              Anticipated d/c is to: Home              Patient currently is not medically stable to d/c.   Difficult to place patient No   Consultants:  Neurology  Procedures: CTA Head and neck, CT head, EEG, MRI Antimicrobials:   Anti-infectives (From admission, onward)    None       Subjective: Patient was seen and examined at bedside.  Overnight events noted. Patient still reports having generalized weakness but denies any dizziness or any numbness.  Objective: Vitals:   08/13/21 0830 08/13/21 0900 08/13/21 1130 08/13/21 1200  BP: 131/70 (!) 152/84 102/60 95/67  Pulse: 74 71 78 69  Resp: 13 15 16 13   Temp:      SpO2: 100% 100% 95% 93%   No intake or output data in the 24 hours ending 08/13/21 1453 There were no vitals filed for this visit.  Examination:  General exam: Appears comfortable, not in any acute distress. Respiratory system: Clear to auscultation bilaterally, no wheezing. Cardiovascular system: S1-S2 heard, regular rate and rhythm, no murmur. Gastrointestinal system: Abdomen is soft, nontender nondistended, BS + Central nervous system: Alert and oriented X 3 . No focal neurological deficits. Extremities: No edema, no cyanosis, no clubbing. Skin: No rashes, lesions or ulcers Psychiatry: Judgement and insight appear normal. Mood & affect appropriate.     Data Reviewed: I have personally reviewed following labs and imaging studies  CBC: Recent Labs  Lab 08/12/21 1509 08/12/21  1545 08/13/21 0726  WBC 8.2 7.2 7.8  NEUTROABS  --  5.2  --   HGB 13.9 13.1 13.5  HCT 42.6 38.8 40.4  MCV 91.8 91.5 93.1  PLT 318 314 263   Basic Metabolic Panel: Recent Labs  Lab 08/12/21 1540 08/13/21 0726  NA 135 138  K 3.7 3.9  CL 101 99  CO2 22 28  GLUCOSE 110* 93  BUN 11 15  CREATININE 0.71 0.80  CALCIUM 9.8 9.8   GFR: CrCl cannot be  calculated (Unknown ideal weight.). Liver Function Tests: Recent Labs  Lab 08/12/21 1540  AST 37  ALT 31  ALKPHOS 71  BILITOT 0.9  PROT 7.3  ALBUMIN 4.1   No results for input(s): LIPASE, AMYLASE in the last 168 hours. Recent Labs  Lab 08/12/21 1510  AMMONIA <9*   Coagulation Profile: Recent Labs  Lab 08/12/21 1509  INR 1.4*   Cardiac Enzymes: No results for input(s): CKTOTAL, CKMB, CKMBINDEX, TROPONINI in the last 168 hours. BNP (last 3 results) No results for input(s): PROBNP in the last 8760 hours. HbA1C: No results for input(s): HGBA1C in the last 72 hours. CBG: Recent Labs  Lab 08/12/21 1503  GLUCAP 99   Lipid Profile: No results for input(s): CHOL, HDL, LDLCALC, TRIG, CHOLHDL, LDLDIRECT in the last 72 hours. Thyroid Function Tests: Recent Labs    08/12/21 1540  TSH 1.138  FREET4 1.20*   Anemia Panel: Recent Labs    08/12/21 1510  VITAMINB12 572   Sepsis Labs: No results for input(s): PROCALCITON, LATICACIDVEN in the last 168 hours.  Recent Results (from the past 240 hour(s))  Resp Panel by RT-PCR (Flu A&B, Covid) Nasopharyngeal Swab     Status: None   Collection Time: 08/12/21  3:34 PM   Specimen: Nasopharyngeal Swab; Nasopharyngeal(NP) swabs in vial transport medium  Result Value Ref Range Status   SARS Coronavirus 2 by RT PCR NEGATIVE NEGATIVE Final    Comment: (NOTE) SARS-CoV-2 target nucleic acids are NOT DETECTED.  The SARS-CoV-2 RNA is generally detectable in upper respiratory specimens during the acute phase of infection. The lowest concentration of SARS-CoV-2 viral copies this assay can detect is 138 copies/mL. A negative result does not preclude SARS-Cov-2 infection and should not be used as the sole basis for treatment or other patient management decisions. A negative result may occur with  improper specimen collection/handling, submission of specimen other than nasopharyngeal swab, presence of viral mutation(s) within the areas  targeted by this assay, and inadequate number of viral copies(<138 copies/mL). A negative result must be combined with clinical observations, patient history, and epidemiological information. The expected result is Negative.  Fact Sheet for Patients:  EntrepreneurPulse.com.au  Fact Sheet for Healthcare Providers:  IncredibleEmployment.be  This test is no t yet approved or cleared by the [place] FDA and  has been authorized for detection and/or diagnosis of SARS-CoV-2 by FDA under an Emergency Use Authorization (EUA). This EUA will remain  in effect (meaning this test can be used) for the duration of the COVID-19 declaration under Section 564(b)(1) of the Act, 21 U.S.C.section 360bbb-3(b)(1), unless the authorization is terminated  or revoked sooner.       Influenza A by PCR NEGATIVE NEGATIVE Final   Influenza B by PCR NEGATIVE NEGATIVE Final    Comment: (NOTE) The Xpert Xpress SARS-CoV-2/FLU/RSV plus assay is intended as an aid in the diagnosis of influenza from Nasopharyngeal swab specimens and should not be used as a sole basis for treatment. Nasal washings  and aspirates are unacceptable for Xpert Xpress SARS-CoV-2/FLU/RSV testing.  Fact Sheet for Patients: EntrepreneurPulse.com.au  Fact Sheet for Healthcare Providers: IncredibleEmployment.be  This test is not yet approved or cleared by the [place] FDA and has been authorized for detection and/or diagnosis of SARS-CoV-2 by FDA under an Emergency Use Authorization (EUA). This EUA will remain in effect (meaning this test can be used) for the duration of the COVID-19 declaration under Section 564(b)(1) of the Act, 21 U.S.C. section 360bbb-3(b)(1), unless the authorization is terminated or revoked.  Performed at [facility], [address], [place], [place] [address]     Radiology Studies: MR BRAIN WO CONTRAST  Result Date:  08/12/2021 CLINICAL DATA:  Initial evaluation for acute seizure. EXAM: MRI HEAD WITHOUT CONTRAST TECHNIQUE: Multiplanar, multiecho pulse sequences of the brain and surrounding structures were obtained without intravenous contrast. COMPARISON:  Comparison made with prior CTs from earlier the same day as well as previous MRI from 02/27/2021. FINDINGS: Brain: Cerebral volume stable, and remains within normal limits for patient age. Hazy T2/FLAIR hyperintensity involving the periventricular white matter noted, most likely related to chronic microvascular ischemic disease, overall mild for age, and stable from prior. Few small foci of encephalomalacia and gliosis involving the parasagittal left parieto-occipital region consistent with chronic ischemic infarcts. Small remote lacunar infarct present at the left thalamus. Changes are stable from prior. No abnormal foci of restricted diffusion to suggest acute or subacute ischemia or changes related to seizure. Gray-white matter differentiation otherwise maintained. No other areas of chronic cortical infarction. No acute intracranial hemorrhage. Single punctate chronic microhemorrhage noted at the anterior left frontal lobe, of doubtful significance in isolation. No mass lesion, midline shift or mass effect. Ventricles normal size without hydrocephalus. No extra-axial fluid collection. Pituitary gland suprasellar region normal. Midline structures intact and normal. Vascular: Major intracranial vascular flow voids are well maintained. Skull and upper cervical spine: Craniocervical junction within normal limits. Bone marrow signal intensity normal. No scalp soft tissue abnormality. Sinuses/Orbits: Prior bilateral ocular lens replacement. Globes and orbital soft tissues demonstrate no acute finding. Paranasal sinuses are largely clear. No significant mastoid effusion. Inner ear structures grossly normal. Other: None. IMPRESSION: 1. No acute intracranial abnormality. 2. 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 3. Underlying mild chronic microvascular ischemic disease for age, also stable. Electronically Signed   By: Jeannine Boga M.D.   On: 08/12/2021 22:08   DG Chest Portable 1 View  Result Date: 08/12/2021 CLINICAL DATA:  Shortness of breath EXAM: PORTABLE CHEST 1 VIEW COMPARISON:  06/29/2021 FINDINGS: Check shadow is within normal limits. Aortic calcifications are noted. The lungs are well aerated bilaterally. Mild atelectatic changes are noted in the left lung base. No acute bony abnormality is noted. IMPRESSION: Mild left basilar atelectasis.  No acute focal infiltrate is seen. Electronically Signed   By: Inez Catalina M.D.   On: 08/12/2021 16:51   EEG adult  Result Date: 08/12/2021 Lora Havens, MD     08/12/2021  6:30 PM Patient Name: EVALINE WALTMAN MRN: [id number] Epilepsy Attending: Lora Havens Referring Physician/Provider: Dr Kerney Elbe Date: 08/12/2021 Duration: 22.14 mins Patient history:  80 year old female with acute change in level of consciousness, in conjunction with low amplitude myoclonic twitching. EEG to evaluate for seizure Level of alertness: Awake, asleep AEDs during EEG study: Versed Technical aspects: This EEG study was done with scalp electrodes positioned according to the 10-20 International system  of electrode placement. Electrical activity was acquired at a sampling rate of 500Hz  and reviewed with a high frequency filter of 70Hz  and a low frequency filter of 1Hz . EEG data were recorded continuously and digitally stored. Description: The posterior dominant rhythm consists of 9 Hz activity of moderate voltage (25-35 uV) seen predominantly in posterior head regions, symmetric and reactive to eye opening and eye closing. Sleep was characterized by vertex waves, sleep spindles (12 to 14 Hz), maximal frontocentral region.  Physiologic photic driving was  seen during photic stimulation. Hyperventilation was not performed.   IMPRESSION: This study is within normal limits. No seizures or epileptiform discharges were seen throughout the recording. Lora Havens   CT HEAD CODE STROKE WO CONTRAST  Result Date: 08/12/2021 CLINICAL DATA:  Code stroke. Neuro deficit, acute, stroke suspected. Weakness. Decreased responsiveness. EXAM: CT HEAD WITHOUT CONTRAST TECHNIQUE: Contiguous axial images were obtained from the base of the skull through the vertex without intravenous contrast. COMPARISON:  06/29/2021 FINDINGS: Brain: No focal abnormality affects the brainstem or cerebellum. 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 No sign of acute infarction, mass lesion, hemorrhage, hydrocephalus or extra-axial collection. Vascular: There is atherosclerotic calcification of the major vessels at the base of the brain. Skull: Negative Sinuses/Orbits: Clear/normal Other: None ASPECTS ([place] Stroke Program Early CT Score) - Ganglionic level infarction (caudate, lentiform nuclei, internal capsule, insula, M1-M3 cortex): 7 - Supraganglionic infarction (M4-M6 cortex): 3 Total score (0-10 with 10 being normal): 10 IMPRESSION: 1. No acute CT finding. 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 2. ASPECTS is 10. 3. These results were called by telephone at the time of interpretation on 08/12/2021 at 3:21 pm to provider [facility] , who verbally acknowledged these results. Electronically Signed   By: Nelson Chimes M.D.   On: 08/12/2021 15:23   CT ANGIO HEAD NECK W WO CM (CODE STROKE)  Result Date: 08/12/2021 CLINICAL DATA:  Neuro deficit, acute, stroke suspected EXAM: CT ANGIOGRAPHY HEAD AND NECK TECHNIQUE: Multidetector CT imaging of the head and neck was performed using the standard protocol during bolus  administration of intravenous contrast. Multiplanar CT image reconstructions and MIPs were obtained to evaluate the vascular anatomy. Carotid stenosis measurements (when applicable) are obtained utilizing NASCET criteria, using the distal internal carotid diameter as the denominator. CONTRAST:  73mL OMNIPAQUE IOHEXOL 350 MG/ML SOLN COMPARISON:  None. FINDINGS: CTA NECK Aortic arch: Calcified plaque along the arch and patent great vessel origins. No high-grade proximal subclavian stenosis. Right carotid system: Patent. Calcified plaque at the common carotid bifurcation and proximal internal carotid with less than 50% stenosis. Left carotid system: Patent. Calcified plaque at the bifurcation and proximal internal carotid without stenosis. Vertebral arteries: Patent and codominant.  No stenosis. Skeleton: Degenerative changes primarily at C5-C6. Degenerative changes at the temporomandibular joints. Other neck: Unremarkable. Upper chest: Included upper lungs are clear. Review of the MIP images confirms the above findings CTA HEAD Anterior circulation: Intracranial internal carotid arteries are patent with calcified plaque but no significant stenosis. Anterior and middle cerebral arteries are patent. There is short segment moderate stenosis proximal left M2 MCA branch. Posterior circulation: Intracranial vertebral arteries are patent. Basilar artery is patent. Major cerebellar artery origins are patent. Right posterior communicating artery is present. Posterior cerebral arteries are patent Venous sinuses: Patent as allowed by contrast bolus timing. Review of the MIP images confirms the above  findings IMPRESSION: No large vessel occlusion hemodynamically significant stenosis, or evidence dissection. 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 These results were called by telephone at the time of interpretation on 08/12/2021 at 3:53 pm to provider [facility] , who verbally acknowledged these results.  Electronically Signed   By: Macy Mis M.D.   On: 08/12/2021 16:06     Scheduled Meds:  amLODipine  10 mg Oral Daily   apixaban  5 mg Oral BID   atorvastatin  40 mg Oral Daily   fluticasone  1 spray Each Nare Daily   levothyroxine  50 mcg Oral Q0600   pantoprazole  40 mg Oral Daily   polyethylene glycol  17 g Oral Daily   polyvinyl alcohol  1 drop Both Eyes BID   spironolactone  25 mg Oral Daily   traZODone  50 mg Oral QHS   Continuous Infusions:  sodium chloride 100 mL/hr at 08/13/21 0216     LOS: 0 days    Time spent: 35 mins    Delanee Xin, MD [facility]   If 7PM-7AM, please contact night-coverage

## 2021-08-13 NOTE — ED Notes (Signed)
MD messaged x2 for diet order

## 2021-08-13 NOTE — ED Notes (Signed)
MD messaged for diet order

## 2021-08-13 NOTE — Evaluation (Addendum)
Physical Therapy Evaluation Patient Details Name: Erin Sutton MRN: [id number] DOB: Mar 17, 1941 Today's Date: 08/13/2021  History of Present Illness  Pt is a 80 y.o. F w/ PMH anxiety, HTN, chronic kidney disease, GERD, hypertension, hypothyroidism, sleep apnea presented to hospital for generalized weakness and AMS.  Clinical Impression  Pt alert, oriented x 4, pleasant and cooperative throughout treatment. Pt states dizziness 3/10 at rest with 5/10 R knee pain. Vitals assessed for sx of dizziness (see below). Pt is planning on TKA next week for R LE. Pt states PLOF is mod-I for ambulation with 3-RW and requires assistance for ADLs intermittent such as cooking and cleaning which a weekly caretaker provides (2-3x/week).   Pt appears to be close to baseline functionally but is still limited in typical ambulation distance. Pt also states feeling "unstable." No LOB noted in standing, however, LOB x 1 seated due to posterior lean requiring PT assistance. Pt required supervision for bed mobility w/ rails and HOB elevated; transferred w/ CGA from elevated surface height, MIN A from lowered surface. Ambulated with CGA, RW without LOB. Due to change in PLOF, decreased strength and balance, and change in functional status SNF is primary recommendation at d/c. Skilled PT intervention is indicated to address deficits in function, mobility, and to return to PLOF as able.    Vitals (Supine) 136/87, 99%, 85 (seated) 153/96, 98%, 86 (post-activity, supine) 141/81     Recommendations for follow up therapy are one component of a multi-disciplinary discharge planning process, led by the attending physician.  Recommendations may be updated based on patient status, additional functional criteria and insurance authorization.  Follow Up Recommendations  SNF    Equipment Recommendations    RW with 5" wheels   Recommendations for Other Services   OT consult    Precautions / Restrictions Precautions Precautions:  Fall Restrictions Weight Bearing Restrictions: No      Mobility  Bed Mobility Overal bed mobility: Needs Assistance Bed Mobility: Supine to Sit     Supine to sit: Supervision     General bed mobility comments: Utilizes bed rails, hOB elevated    Transfers Overall transfer level: Needs assistance Equipment used: Rolling walker (2 wheeled) Transfers: Sit to/from Stand Sit to Stand: Min guard;Min assist         General transfer comment: MIN G for safety-dizziness upon standing from elevated surface; MIN A from lowered surface height  Ambulation/Gait Ambulation/Gait assistance: Min guard Gait Distance (Feet): 20 Feet Assistive device: Rolling walker (2 wheeled) Gait Pattern/deviations: Step-through pattern;Wide base of support;Trunk flexed;Decreased step length - left;Decreased stance time - right     General Gait Details: Decreased cadence, no LOB noted  Stairs            Wheelchair Mobility    Modified Rankin (Stroke Patients Only)       Balance Overall balance assessment: Needs assistance Sitting-balance support: Feet unsupported;Bilateral upper extremity supported Sitting balance-Leahy Scale: Fair Sitting balance - Comments: LOB x 1 posterior lean required PT assist; to correct,able to sit > 6 min without support     Standing balance-Leahy Scale: Fair Standing balance comment: requires BUE support                             Pertinent Vitals/Pain Pain Assessment: 0-10 Pain Score: 5  Pain Location: R knee Pain Descriptors / Indicators: Aching;Sore Pain Intervention(s): Limited activity within patient's tolerance;Monitored during session;Repositioned    Home Living Family/patient expects to  be discharged to:: Private residence Living Arrangements: Alone Available Help at Discharge: Personal care attendant;Available PRN/intermittently Type of Home: Mobile home Home Access: Stairs to enter Entrance Stairs-Rails: Right Entrance  Stairs-Number of Steps: 3 Home Layout: One level Home Equipment: Cane - single point;Bedside commode;Tub bench;Other (comment) (Walker 3 wheels)      Prior Function Level of Independence: Needs assistance   Gait / Transfers Assistance Needed: MOD I w/ SPC to restroom, 3-RW for household amb  ADL's / Homemaking Assistance Needed: Pt does not drive and has a caretaker to drive her to appts, cooks, cleans 2-3x/week; care taker is currently sick and will not be available at d/c        Hand Dominance   Dominant Hand: Right    Extremity/Trunk Assessment   Upper Extremity Assessment Upper Extremity Assessment: Generalized weakness    Lower Extremity Assessment Lower Extremity Assessment: Generalized weakness (decreased sensation light touch bilat dorsal & plantar aspects of toes)       Communication   Communication: No difficulties  Cognition Arousal/Alertness: Awake/alert Behavior During Therapy: WFL for tasks assessed/performed Overall Cognitive Status: Within Functional Limits for tasks assessed                                 General Comments: AOx4, cooperative, pleasant      General Comments      Exercises Other Exercises Other Exercises: Bed > toilet pt able to perform pericare with min-A; sit > stand from lowered surface w/ min-A x 1, RW   Assessment/Plan    PT Assessment    PT Problem List         PT Treatment Interventions      PT Goals (Current goals can be found in the Care Plan section)       Frequency     Barriers to discharge        Co-evaluation               AM-PAC PT "6 Clicks" Mobility  Outcome Measure                  End of Session              Time:  -      Charges:             [employer], SPT

## 2021-08-13 NOTE — ED Notes (Signed)
Pt anxious, meds given .  Siderails up x 2.

## 2021-08-13 NOTE — ED Notes (Signed)
Pt eating lunch tray  

## 2021-08-13 NOTE — ED Notes (Signed)
Pt watching tv

## 2021-08-13 NOTE — ED Notes (Signed)
Resumed care from bri rn.  Pt alert and oriented.  Denies any pain.  Speech clear.  Pt waiting on admission bed.

## 2021-08-13 NOTE — ED Notes (Signed)
Informed RN bed assigned 

## 2021-08-13 NOTE — ED Notes (Signed)
Report messaged to laura rn floor nurse

## 2021-08-14 DIAGNOSIS — F411 Generalized anxiety disorder: Secondary | ICD-10-CM | POA: Diagnosis not present

## 2021-08-14 DIAGNOSIS — I10 Essential (primary) hypertension: Secondary | ICD-10-CM | POA: Diagnosis not present

## 2021-08-14 DIAGNOSIS — F0390 Unspecified dementia without behavioral disturbance: Secondary | ICD-10-CM | POA: Diagnosis not present

## 2021-08-14 DIAGNOSIS — R253 Fasciculation: Secondary | ICD-10-CM | POA: Diagnosis not present

## 2021-08-14 MED ORDER — ACETAMINOPHEN 500 MG PO TABS
1000.0000 mg | ORAL_TABLET | Freq: Three times a day (TID) | ORAL | Status: DC
  Filled 2021-08-14: qty 2

## 2021-08-14 MED ORDER — ACETAMINOPHEN 500 MG PO TABS: 1000.0000 mg | ORAL_TABLET | Freq: Three times a day (TID) | ORAL | 0 refills | Status: DC

## 2021-08-14 MED ORDER — OXYCODONE HCL 5 MG PO TABS: 2.5000 mg | ORAL_TABLET | Freq: Three times a day (TID) | ORAL | 0 refills | Status: AC | PRN

## 2021-08-14 MED ORDER — OXYCODONE HCL 5 MG PO TABS: 2.5000 mg | ORAL_TABLET | Freq: Four times a day (QID) | ORAL | Status: DC | PRN

## 2021-08-14 NOTE — TOC Progression Note (Signed)
Transition of Care [facility]) - Progression Note    Patient Details  Name: GLENN CHRISTO MRN: [id number] Date of Birth: 12/03/1940  Transition of [facility]) CM/SW [place], RN Phone Number: 08/14/2021, 2:18 PM  Clinical Narrative:   Patient states she plans to discharge with neighbor, who can assist her at home.  She states neighbor will pick her up at 4pm today.  Patient has [place] with [facility].  She also has walker, and BSC at home  She feels safe returning home, and has no concerns with transportation to appointments or to the pharmacy and state she can take medications as directed.         Expected Discharge Plan and Services           Expected Discharge Date: 08/14/21                                     Social Determinants of Health (SDOH) Interventions    Readmission Risk Interventions No flowsheet data found.

## 2021-08-14 NOTE — Discharge Summary (Signed)
Physician Discharge Summary  ANTAVIA TANDY SWH:[id number] DOB: Apr 26, 1941 DOA: 08/12/2021  PCP: Baxter Hire, MD  Admit date: 08/12/2021 Discharge date: 08/14/2021  Admitted From: Home  Disposition:  Home   Recommendations for Outpatient Follow-up:  Follow up with Dr. Edwina Barth PCP in 1 week     Home Health: PT/OT due to imbalance  Equipment/Devices: None new  Discharge Condition: Fair  CODE STATUS: FULL Diet recommendation: Regular  Brief/Interim Summary: Mrs. Selke is an 80 y.o. F with arthritis and chronic knee pain, HTN, hypothyroidism, history of VTE, OSA and anxiety who presented with generalized weakness and had an episode of transient altered consciousness while in the ER.  Code stroke was initially called, she was evaluated urgently by neurology.    PRINCIPAL HOSPITAL DIAGNOSIS: Psychogenic twitching    Discharge Diagnoses:  Psychogenic twitching Patient was admitted, MRI brain was unremarkable.  EEG was obtained and was unremarkable.  Balaji evaluated the patient and felt like her unresponsive episode and twitching more psychogenic.  The patient was observed in the hospital, PT evaluated her and recommended HH.  She was discharged to home with Orthopedics follow up for her knee.  Essential hypertension Hypothyroidism Sleep apnea Anxiety History of venous thromboembolism       Discharge Instructions  Discharge Instructions     Discharge instructions   Complete by: As directed    From Dr. Loleta Books: You were admitted for confusion and an episode like a seizure.  You were evaluated by our neurologist here, who was able to determine that it was NOT a seizure, thankfully.  It was likely the effects of your medicines.  Do NOT take Tramadol Avoid Ativan/lorazepam  For your knee pain:  Continue follow up with your doctor as previously planned Take acetaminophen 1000 mg (2 tabs) three times daily If needed, take very low dose of oxycodone 2.5 mg  (1/2 tab) up to 3 times daily  Do not take oxycodone before driving and do NOT take oxycodone with Ativan   Increase activity slowly   Complete by: As directed       Allergies as of 08/14/2021       Reactions   Ace Inhibitors Cough   Levofloxacin    Penicillin G Other (See Comments)   Tape Other (See Comments)   Other reaction(s): Other (See Comments)   Amoxicillin Rash   Has patient had a PCN reaction causing immediate rash, facial/tongue/throat swelling, SOB or lightheadedness with hypotension: No Has patient had a PCN reaction causing severe rash involving mucus membranes or skin necrosis: No Has patient had a PCN reaction that required hospitalization: No Has patient had a PCN reaction occurring within the last 10 years: No If all of the above answers are "NO", then may proceed with Cephalosporin use.   Benicar [olmesartan] Rash   Itching rash   Codeine Rash   Codeine Sulfate Rash   Levaquin [levofloxacin In D5w] Rash   Alters mental status   Penicillin V Potassium Rash   Has patient had a PCN reaction causing immediate rash, facial/tongue/throat swelling, SOB or lightheadedness with hypotension: No Has patient had a PCN reaction causing severe rash involving mucus membranes or skin necrosis: No Has patient had a PCN reaction that required hospitalization: No Has patient had a PCN reaction occurring within the last 10 years: No If all of the above answers are "NO", then may proceed with Cephalosporin use.   Sulfa Antibiotics Rash        Medication List  STOP taking these medications    fluticasone 50 MCG/ACT nasal spray Commonly known as: FLONASE   LORazepam 0.5 MG tablet Commonly known as: ATIVAN   ondansetron 8 MG disintegrating tablet Commonly known as: Zofran ODT   traZODone 50 MG tablet Commonly known as: DESYREL       TAKE these medications    acetaminophen 500 MG tablet Commonly known as: TYLENOL Take 2 tablets (1,000 mg total) by mouth 3  (three) times daily. What changed:  how much to take when to take this reasons to take this   amLODipine 10 MG tablet Commonly known as: NORVASC Take 10 mg by mouth daily.   apixaban 5 MG Tabs tablet Commonly known as: ELIQUIS Take 5 mg by mouth 2 (two) times daily.   atorvastatin 40 MG tablet Commonly known as: LIPITOR Take 40 mg by mouth daily.   cyanocobalamin 1000 MCG/ML injection Commonly known as: (VITAMIN B-12) Inject 1,000 mcg into the muscle See admin instructions. Inject 1041mcg once weekly for 4 weeks then recheck by Dr Wynetta Emery   levothyroxine 50 MCG tablet Commonly known as: SYNTHROID Take 50 mcg by mouth daily before breakfast.   neomycin-polymyxin b-dexamethasone 3.5-10000-0.1 Oint Commonly known as: MAXITROL 1 application in the morning and at bedtime. (Affected eye/eyes)   oxyCODONE 5 MG immediate release tablet Commonly known as: Oxy IR/ROXICODONE Take 0.5 tablets (2.5 mg total) by mouth every 8 (eight) hours as needed for up to 5 days for breakthrough pain.   pantoprazole 40 MG tablet Commonly known as: PROTONIX Take 40 mg by mouth daily.   polyethylene glycol powder 17 GM/SCOOP powder Commonly known as: GLYCOLAX/MIRALAX Take 17 g by mouth daily.   senna-docusate 8.6-50 MG tablet Commonly known as: Senokot-S Take 2 tablets by mouth 2 (two) times daily as needed for mild constipation.   spironolactone 25 MG tablet Commonly known as: ALDACTONE Take 25 mg by mouth daily.   SYSTANE OP Place 1 drop into both eyes 2 (two) times daily.        Follow-up Information     Baxter Hire, MD Follow up.   Specialty: Internal Medicine Contact information: [place] [address] [phone]                Allergies  Allergen Reactions   Ace Inhibitors Cough   Levofloxacin    Penicillin G Other (See Comments)   Tape Other (See Comments)    Other reaction(s): Other (See Comments)   Amoxicillin Rash    Has  patient had a PCN reaction causing immediate rash, facial/tongue/throat swelling, SOB or lightheadedness with hypotension: No Has patient had a PCN reaction causing severe rash involving mucus membranes or skin necrosis: No Has patient had a PCN reaction that required hospitalization: No Has patient had a PCN reaction occurring within the last 10 years: No If all of the above answers are "NO", then may proceed with Cephalosporin use.    Benicar [Olmesartan] Rash    Itching rash   Codeine Rash   Codeine Sulfate Rash   Levaquin [Levofloxacin In D5w] Rash    Alters mental status   Penicillin V Potassium Rash    Has patient had a PCN reaction causing immediate rash, facial/tongue/throat swelling, SOB or lightheadedness with hypotension: No Has patient had a PCN reaction causing severe rash involving mucus membranes or skin necrosis: No Has patient had a PCN reaction that required hospitalization: No Has patient had a PCN reaction occurring within the last 10 years: No  If all of the above answers are "NO", then may proceed with Cephalosporin use.    Sulfa Antibiotics Rash    Consultations: Neurology, Dr Cheral Marker, discussed final diagnosis with him today, confirmed cleared for discharge from Neurological standpoint   Procedures/Studies: MR BRAIN WO CONTRAST  Result Date: 08/12/2021 CLINICAL DATA:  Initial evaluation for acute seizure. EXAM: MRI HEAD WITHOUT CONTRAST TECHNIQUE: Multiplanar, multiecho pulse sequences of the brain and surrounding structures were obtained without intravenous contrast. COMPARISON:  Comparison made with prior CTs from earlier the same day as well as previous MRI from 02/27/2021. FINDINGS: Brain: Cerebral volume stable, and remains within normal limits for patient age. Hazy T2/FLAIR hyperintensity involving the periventricular white matter noted, most likely related to chronic microvascular ischemic disease, overall mild for age, and stable from prior. Few small foci  of encephalomalacia and gliosis involving the parasagittal left parieto-occipital region consistent with chronic ischemic infarcts. Small remote lacunar infarct present at the left thalamus. Changes are stable from prior. No abnormal foci of restricted diffusion to suggest acute or subacute ischemia or changes related to seizure. Gray-white matter differentiation otherwise maintained. No other areas of chronic cortical infarction. No acute intracranial hemorrhage. Single punctate chronic microhemorrhage noted at the anterior left frontal lobe, of doubtful significance in isolation. No mass lesion, midline shift or mass effect. Ventricles normal size without hydrocephalus. No extra-axial fluid collection. Pituitary gland suprasellar region normal. Midline structures intact and normal. Vascular: Major intracranial vascular flow voids are well maintained. Skull and upper cervical spine: Craniocervical junction within normal limits. Bone marrow signal intensity normal. No scalp soft tissue abnormality. Sinuses/Orbits: Prior bilateral ocular lens replacement. Globes and orbital soft tissues demonstrate no acute finding. Paranasal sinuses are largely clear. No significant mastoid effusion. Inner ear structures grossly normal. Other: None. IMPRESSION: 1. No acute intracranial abnormality. 2. 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 3. Underlying mild chronic microvascular ischemic disease for age, also stable. Electronically Signed   By: Jeannine Boga M.D.   On: 08/12/2021 22:08   DG Chest Portable 1 View  Result Date: 08/12/2021 CLINICAL DATA:  Shortness of breath EXAM: PORTABLE CHEST 1 VIEW COMPARISON:  06/29/2021 FINDINGS: Check shadow is within normal limits. Aortic calcifications are noted. The lungs are well aerated bilaterally. Mild atelectatic changes are noted in the left lung base. No acute bony abnormality is  noted. IMPRESSION: Mild left basilar atelectasis.  No acute focal infiltrate is seen. Electronically Signed   By: Inez Catalina M.D.   On: 08/12/2021 16:51   EEG adult  Result Date: 08/12/2021 Lora Havens, MD     08/12/2021  6:30 PM Patient Name: Erin Sutton MRN: [id number] Epilepsy Attending: Lora Havens Referring Physician/Provider: Dr Kerney Elbe Date: 08/12/2021 Duration: 22.14 mins Patient history:  80 year old female with acute change in level of consciousness, in conjunction with low amplitude myoclonic twitching. EEG to evaluate for seizure Level of alertness: Awake, asleep AEDs during EEG study: Versed Technical aspects: This EEG study was done with scalp electrodes positioned according to the 10-20 International system of electrode placement. Electrical activity was acquired at a sampling rate of 500Hz  and reviewed with a high frequency filter of 70Hz  and a low frequency filter of 1Hz . EEG data were recorded continuously and digitally stored. Description: The posterior dominant rhythm consists of 9 Hz activity of moderate voltage (25-35 uV) seen predominantly in posterior head regions, symmetric and reactive to eye opening and eye  closing. Sleep was characterized by vertex waves, sleep spindles (12 to 14 Hz), maximal frontocentral region.  Physiologic photic driving was seen during photic stimulation. Hyperventilation was not performed.   IMPRESSION: This study is within normal limits. No seizures or epileptiform discharges were seen throughout the recording. Lora Havens   CT HEAD CODE STROKE WO CONTRAST  Result Date: 08/12/2021 CLINICAL DATA:  Code stroke. Neuro deficit, acute, stroke suspected. Weakness. Decreased responsiveness. EXAM: CT HEAD WITHOUT CONTRAST TECHNIQUE: Contiguous axial images were obtained from the base of the skull through the vertex without intravenous contrast. COMPARISON:  06/29/2021 FINDINGS: Brain: No focal abnormality affects the brainstem or cerebellum.  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 No sign of acute infarction, mass lesion, hemorrhage, hydrocephalus or extra-axial collection. Vascular: There is atherosclerotic calcification of the major vessels at the base of the brain. Skull: Negative Sinuses/Orbits: Clear/normal Other: None ASPECTS ([place] Stroke Program Early CT Score) - Ganglionic level infarction (caudate, lentiform nuclei, internal capsule, insula, M1-M3 cortex): 7 - Supraganglionic infarction (M4-M6 cortex): 3 Total score (0-10 with 10 being normal): 10 IMPRESSION: 1. No acute CT finding. 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 2. ASPECTS is 10. 3. These results were called by telephone at the time of interpretation on 08/12/2021 at 3:21 pm to provider [facility] , who verbally acknowledged these results. Electronically Signed   By: Nelson Chimes M.D.   On: 08/12/2021 15:23   CT ANGIO HEAD NECK W WO CM (CODE STROKE)  Result Date: 08/12/2021 CLINICAL DATA:  Neuro deficit, acute, stroke suspected EXAM: CT ANGIOGRAPHY HEAD AND NECK TECHNIQUE: Multidetector CT imaging of the head and neck was performed using the standard protocol during bolus administration of intravenous contrast. Multiplanar CT image reconstructions and MIPs were obtained to evaluate the vascular anatomy. Carotid stenosis measurements (when applicable) are obtained utilizing NASCET criteria, using the distal internal carotid diameter as the denominator. CONTRAST:  41mL OMNIPAQUE IOHEXOL 350 MG/ML SOLN COMPARISON:  None. FINDINGS: CTA NECK Aortic arch: Calcified plaque along the arch and patent great vessel origins. No high-grade proximal subclavian stenosis. Right carotid system: Patent. Calcified plaque at the common carotid bifurcation and proximal internal carotid with less than 50% stenosis. Left  carotid system: Patent. Calcified plaque at the bifurcation and proximal internal carotid without stenosis. Vertebral arteries: Patent and codominant.  No stenosis. Skeleton: Degenerative changes primarily at C5-C6. Degenerative changes at the temporomandibular joints. Other neck: Unremarkable. Upper chest: Included upper lungs are clear. Review of the MIP images confirms the above findings CTA HEAD Anterior circulation: Intracranial internal carotid arteries are patent with calcified plaque but no significant stenosis. Anterior and middle cerebral arteries are patent. There is short segment moderate stenosis proximal left M2 MCA branch. Posterior circulation: Intracranial vertebral arteries are patent. Basilar artery is patent. Major cerebellar artery origins are patent. Right posterior communicating artery is present. Posterior cerebral arteries are patent Venous sinuses: Patent as allowed by contrast bolus timing. Review of the MIP images confirms the above findings IMPRESSION: No large vessel occlusion hemodynamically significant stenosis, or evidence dissection. 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 These results were called by telephone at the time of interpretation on 08/12/2021 at 3:53 pm to provider [facility] , who verbally acknowledged these results. Electronically Signed   By: Macy Mis M.D.   On: 08/12/2021 16:06  Subjective: Still has knee pain.  No confusion, seizure-like episodes, loss of consciousness, headache, chest pain, abdominal pain, dyspnea.  Discharge Exam: Vitals:   08/14/21 0718 08/14/21 1147  BP: (!) 98/56 (!) 143/71  Pulse: 81 83  Resp: 18 18  Temp: (!) 97.4 F (36.3 C) 98.1 F (36.7 C)  SpO2: 95% 97%   Vitals:   08/14/21 0340 08/14/21 0425 08/14/21 0718 08/14/21 1147  BP:  (!) 157/73 (!) 98/56 (!) 143/71  Pulse:  75 81 83  Resp:  20 18 18   Temp:  98.1 F (36.7 C) (!) 97.4 F (36.3 C) 98.1 F (36.7 C)  TempSrc:  Oral Oral Oral   SpO2:  99% 95% 97%  Weight: 68.2 kg     Height:        General: Pt is alert, awake, not in acute distress Cardiovascular: RRR, nl S1-S2, no murmurs appreciated.   No LE edema.   Respiratory: Normal respiratory rate and rhythm.  CTAB without rales or wheezes. Abdominal: Abdomen soft and non-tender.  No distension or HSM.   Neuro/Psych: Strength symmetric in upper and lower extremities.  Judgment and insight appear normal.   The results of significant diagnostics from this hospitalization (including imaging, microbiology, ancillary and laboratory) are listed below for reference.     Microbiology: Recent Results (from the past 240 hour(s))  Resp Panel by RT-PCR (Flu A&B, Covid) Nasopharyngeal Swab     Status: None   Collection Time: 08/12/21  3:34 PM   Specimen: Nasopharyngeal Swab; Nasopharyngeal(NP) swabs in vial transport medium  Result Value Ref Range Status   SARS Coronavirus 2 by RT PCR NEGATIVE NEGATIVE Final    Comment: (NOTE) SARS-CoV-2 target nucleic acids are NOT DETECTED.  The SARS-CoV-2 RNA is generally detectable in upper respiratory specimens during the acute phase of infection. The lowest concentration of SARS-CoV-2 viral copies this assay can detect is 138 copies/mL. A negative result does not preclude SARS-Cov-2 infection and should not be used as the sole basis for treatment or other patient management decisions. A negative result may occur with  improper specimen collection/handling, submission of specimen other than nasopharyngeal swab, presence of viral mutation(s) within the areas targeted by this assay, and inadequate number of viral copies(<138 copies/mL). A negative result must be combined with clinical observations, patient history, and epidemiological information. The expected result is Negative.  Fact Sheet for Patients:  EntrepreneurPulse.com.au  Fact Sheet for Healthcare Providers:   IncredibleEmployment.be  This test is no t yet approved or cleared by the [place] FDA and  has been authorized for detection and/or diagnosis of SARS-CoV-2 by FDA under an Emergency Use Authorization (EUA). This EUA will remain  in effect (meaning this test can be used) for the duration of the COVID-19 declaration under Section 564(b)(1) of the Act, 21 U.S.C.section 360bbb-3(b)(1), unless the authorization is terminated  or revoked sooner.       Influenza A by PCR NEGATIVE NEGATIVE Final   Influenza B by PCR NEGATIVE NEGATIVE Final    Comment: (NOTE) The Xpert Xpress SARS-CoV-2/FLU/RSV plus assay is intended as an aid in the diagnosis of influenza from Nasopharyngeal swab specimens and should not be used as a sole basis for treatment. Nasal washings and aspirates are unacceptable for Xpert Xpress SARS-CoV-2/FLU/RSV testing.  Fact Sheet for Patients: EntrepreneurPulse.com.au  Fact Sheet for Healthcare Providers: IncredibleEmployment.be  This test is not yet approved or cleared by the [place] FDA and has been authorized for detection and/or diagnosis of SARS-CoV-2 by FDA  under an Emergency Use Authorization (EUA). This EUA will remain in effect (meaning this test can be used) for the duration of the COVID-19 declaration under Section 564(b)(1) of the Act, 21 U.S.C. section 360bbb-3(b)(1), unless the authorization is terminated or revoked.  Performed at [facility], [address] [place], [place] [address]      Labs: BNP (last 3 results) Recent Labs    06/29/21 1003  BNP 68.1   Basic Metabolic Panel: Recent Labs  Lab 08/12/21 1540 08/13/21 0726  NA 135 138  K 3.7 3.9  CL 101 99  CO2 22 28  GLUCOSE 110* 93  BUN 11 15  CREATININE 0.71 0.80  CALCIUM 9.8 9.8   Liver Function Tests: Recent Labs  Lab 08/12/21 1540  AST 37  ALT 31  ALKPHOS 71  BILITOT 0.9  PROT 7.3  ALBUMIN 4.1    No results for input(s): LIPASE, AMYLASE in the last 168 hours. Recent Labs  Lab 08/12/21 1510  AMMONIA <9*   CBC: Recent Labs  Lab 08/12/21 1509 08/12/21 1545 08/13/21 0726  WBC 8.2 7.2 7.8  NEUTROABS  --  5.2  --   HGB 13.9 13.1 13.5  HCT 42.6 38.8 40.4  MCV 91.8 91.5 93.1  PLT 318 314 291   Cardiac Enzymes: No results for input(s): CKTOTAL, CKMB, CKMBINDEX, TROPONINI in the last 168 hours. BNP: Invalid input(s): POCBNP CBG: Recent Labs  Lab 08/12/21 1503  GLUCAP 99   D-Dimer No results for input(s): DDIMER in the last 72 hours. Hgb A1c No results for input(s): HGBA1C in the last 72 hours. Lipid Profile No results for input(s): CHOL, HDL, LDLCALC, TRIG, CHOLHDL, LDLDIRECT in the last 72 hours. Thyroid function studies Recent Labs    08/12/21 1540  TSH 1.138   Anemia work up Recent Labs    08/12/21 1510  VITAMINB12 572   Urinalysis    Component Value Date/Time   COLORURINE YELLOW 08/12/2021 [place] 08/12/2021 [place] 07/25/2014 1350   LABSPEC 1.020 08/12/2021 1830   LABSPEC 1.020 07/25/2014 1350   PHURINE 8.5 (H) 08/12/2021 1830   GLUCOSEU NEGATIVE 08/12/2021 1830   GLUCOSEU Negative 07/25/2014 1350   HGBUR TRACE (A) 08/12/2021 1830   BILIRUBINUR NEGATIVE 08/12/2021 1830   BILIRUBINUR Negative 07/25/2014 1350   [place] 08/12/2021 1830   PROTEINUR NEGATIVE 08/12/2021 1830   NITRITE NEGATIVE 08/12/2021 1830   LEUKOCYTESUR NEGATIVE 08/12/2021 1830   LEUKOCYTESUR 2+ 07/25/2014 1350   Sepsis Labs Invalid input(s): PROCALCITONIN,  WBC,  LACTICIDVEN Microbiology Recent Results (from the past 240 hour(s))  Resp Panel by RT-PCR (Flu A&B, Covid) Nasopharyngeal Swab     Status: None   Collection Time: 08/12/21  3:34 PM   Specimen: Nasopharyngeal Swab; Nasopharyngeal(NP) swabs in vial transport medium  Result Value Ref Range Status   SARS Coronavirus 2 by RT PCR NEGATIVE NEGATIVE Final    Comment:  (NOTE) SARS-CoV-2 target nucleic acids are NOT DETECTED.  The SARS-CoV-2 RNA is generally detectable in upper respiratory specimens during the acute phase of infection. The lowest concentration of SARS-CoV-2 viral copies this assay can detect is 138 copies/mL. A negative result does not preclude SARS-Cov-2 infection and should not be used as the sole basis for treatment or other patient management decisions. A negative result may occur with  improper specimen collection/handling, submission of specimen other than nasopharyngeal swab, presence of viral mutation(s) within the areas targeted by this assay, and inadequate number of viral copies(<138  copies/mL). A negative result must be combined with clinical observations, patient history, and epidemiological information. The expected result is Negative.  Fact Sheet for Patients:  EntrepreneurPulse.com.au  Fact Sheet for Healthcare Providers:  IncredibleEmployment.be  This test is no t yet approved or cleared by the [place] FDA and  has been authorized for detection and/or diagnosis of SARS-CoV-2 by FDA under an Emergency Use Authorization (EUA). This EUA will remain  in effect (meaning this test can be used) for the duration of the COVID-19 declaration under Section 564(b)(1) of the Act, 21 U.S.C.section 360bbb-3(b)(1), unless the authorization is terminated  or revoked sooner.       Influenza A by PCR NEGATIVE NEGATIVE Final   Influenza B by PCR NEGATIVE NEGATIVE Final    Comment: (NOTE) The Xpert Xpress SARS-CoV-2/FLU/RSV plus assay is intended as an aid in the diagnosis of influenza from Nasopharyngeal swab specimens and should not be used as a sole basis for treatment. Nasal washings and aspirates are unacceptable for Xpert Xpress SARS-CoV-2/FLU/RSV testing.  Fact Sheet for Patients: EntrepreneurPulse.com.au  Fact Sheet for Healthcare  Providers: IncredibleEmployment.be  This test is not yet approved or cleared by the [place] FDA and has been authorized for detection and/or diagnosis of SARS-CoV-2 by FDA under an Emergency Use Authorization (EUA). This EUA will remain in effect (meaning this test can be used) for the duration of the COVID-19 declaration under Section 564(b)(1) of the Act, 21 U.S.C. section 360bbb-3(b)(1), unless the authorization is terminated or revoked.  Performed at [facility], [address] [place], [place] [address]      Time coordinating discharge: 25 minutes The  controlled substances registry was reviewed for this patient prior to filling the <5 days supply controlled substances script.         SIGNED:   Edwin Dada, MD  [facility] 08/14/2021, 5:11 PM

## 2021-08-14 NOTE — Plan of Care (Signed)
Report received at bedside. A/O x 4 with even and unlabored breathing. 4Ps and plan of care discussed. Denies any discomfort at this time but exhibited anxiety. Lorazepam administered. Call light within reach. Continue poc/ hourly rounding. Problem: Education: Goal: Knowledge of General Education information will improve Description: Including pain rating scale, medication(s)/side effects and non-pharmacologic comfort measures Outcome: Progressing   Problem: Coping: Goal: Level of anxiety will decrease Outcome: Progressing

## 2021-08-14 NOTE — Evaluation (Signed)
Occupational Therapy Evaluation Patient Details Name: Erin Sutton MRN: [id number] DOB: 1941-05-25 Today's Date: 08/14/2021   History of Present Illness Pt is a 80 y.o. F w/ PMH anxiety, HTN, chronic kidney disease, GERD, hypertension, hypothyroidism, sleep apnea presented to hospital for generalized weakness and AMS. Concern for s/s r/t possible post-ictal state. CT of head, CTA of head and neck, MRI and EEG all unremarkable.   Clinical Impression   Pt seen for OT evaluation this date in setting of acute hospitalization d/t AMS. Pt presents this date with improved mentation versus on admission and she is agreeable to session. Pt reports being MOD I for fxl mobility at baseline with 3WW and states that her neighbor and friend alternate assisting her with IADLs such as carrying groceries. Pt presents this date with some decreased fxl activity tolerance superimposed on chronic R knee pain for which she is scheduled TKR next week. Pt able to CTS and perform fxl mobility with RW with SUPV only. Demos good control and stability and is primarily only pain limited with R knee. Pt struggles with dressing R LE as well 2/2 pain which is her baseline. Pt able to descend/ascend 2 steps to simulate her home entrance with CGA and states that her neighbor is able and willing to help in this manner. From OT Standpoint, pt is okay to d/c home with [facility] and assist from friends and neighbors for IADLs and more dynamic mobility tasks.     Recommendations for follow up therapy are one component of a multi-disciplinary discharge planning process, led by the attending physician.  Recommendations may be updated based on patient status, additional functional criteria and insurance authorization.   Follow Up Recommendations  Home health OT;Supervision - Intermittent (neighbor agreeable to supervising for ascending steps into the home and assisting with IADLs such as bringing in groceries.)    Equipment Recommendations   Other (comment) English as a second language teacher)    Recommendations for Other Services       Precautions / Restrictions Precautions Precautions: Fall Restrictions Weight Bearing Restrictions: No      Mobility Bed Mobility Overal bed mobility: Modified Independent             General bed mobility comments: increased time, use of bed rails    Transfers Overall transfer level: Needs assistance Equipment used: Rolling walker (2 wheeled) Transfers: Sit to/from Stand Sit to Stand: Supervision         General transfer comment: increased time. BP taken and stable this date (126/64 (70) after standing activity x5 minutes. Requires UE support on RW for stability d/t pain in R knee.    Balance Overall balance assessment: Needs assistance Sitting-balance support: Feet unsupported;Bilateral upper extremity supported Sitting balance-Leahy Scale: Normal Sitting balance - Comments: N static sitting, G dynamic sitting     Standing balance-Leahy Scale: Fair Standing balance comment: requires UE support 2/2 chronic R knee pain, to use UEs to alleviate pressure to the joint                           ADL either performed or assessed with clinical judgement   ADL Overall ADL's : Needs assistance/impaired                                       General ADL Comments: Pt does struggle to complete LB ADLs d/t pain and limited R knee  ROM, she is able to complete donning socks and underwear with increased time, struggle and SUPV for safety with dynamic sitting balance. could potentially benefit from AE, but worth seeing how she might improved after her scheduled surgery. SUPV for all LB and all standing tasks performed. RW for all mobility. MIN A to thread underwear over R LE in sitting primarily d/t pain.     Vision Patient Visual Report: No change from baseline       Perception     Praxis      Pertinent Vitals/Pain Pain Assessment: 0-10 Pain Score: 5  Pain Location:  R knee, chronic, sched for R knee sx next week with [facility] Pain Descriptors / Indicators: Aching;Sore Pain Intervention(s): Limited activity within patient's tolerance;Monitored during session;Repositioned     Hand Dominance Right   Extremity/Trunk Assessment Upper Extremity Assessment Upper Extremity Assessment: Overall WFL for tasks assessed;Generalized weakness (ROM WFL, MMT grossly 4-/5)   Lower Extremity Assessment Lower Extremity Assessment: RLE deficits/detail;LLE deficits/detail RLE Deficits / Details: limited tolerance for knee flexion/ext, chronic r/t arthritis. Sched for knee replacement sx. LLE Deficits / Details: h/o knee replacement on L side, good functional ROM for ADLs. MMT grossly 4-/5 throughout.       Communication Communication Communication: No difficulties   Cognition Arousal/Alertness: Awake/alert Behavior During Therapy: WFL for tasks assessed/performed Overall Cognitive Status: Within Functional Limits for tasks assessed                                 General Comments: AOx4, cooperative, pleasant   General Comments       Exercises Other Exercises Other Exercises: OT engages pt in bathing, dressing, grooming and fxl mobility. Pt able to perform all tasks with SUPV to MIN A (only for threading underwear over R foot)   Shoulder Instructions      Home Living Family/patient expects to be discharged to:: Private residence Living Arrangements: Alone Available Help at Discharge: Personal care attendant;Available PRN/intermittently Type of Home: Mobile home Home Access: Stairs to enter Entrance Stairs-Number of Steps: 2 Entrance Stairs-Rails: Right Home Layout: One level     Bathroom Shower/Tub: Teacher, early years/pre: Standard Bathroom Accessibility: Yes   Home Equipment: Cane - single point;Bedside commode;Tub bench;Other (comment)   Additional Comments: 3-wheel walker      Prior Functioning/Environment Level of  Independence: Needs assistance  Gait / Transfers Assistance Needed: MOD I w/ SPC to restroom, 3-RW for household amb. States that her neighbor sometimes supervises with entering/exiting the home d/t 2 steps. ADL's / Homemaking Assistance Needed: Pt does not drive and has a caretaker to drive her to appts, cooks, cleans 2-3x/week; care taker is currently sick and will not be available at d/c. Pt reports that her neighbor has agreed to help with some IADLs such as bringing in groceries.   Comments: reports she hasn't driven since two admissions ago. States that her neighbor and friend have been alternating assisting her with IADLs.        OT Problem List: Decreased range of motion;Decreased activity tolerance;Pain      OT Treatment/Interventions: Self-care/ADL training;Therapeutic exercise;DME and/or AE instruction;Therapeutic activities;Patient/family education    OT Goals(Current goals can be found in the care plan section) Acute Rehab OT Goals Patient Stated Goal: to get knee surgery OT Goal Formulation: With patient Time For Goal Achievement: 08/28/21 Potential to Achieve Goals: Good ADL Goals Pt Will Perform Lower Body Bathing: with modified  independence Pt Will Perform Lower Body Dressing: with modified independence Pt Will Perform Toileting - Clothing Manipulation and hygiene: with modified independence  OT Frequency: Min 1X/week   Barriers to D/C:            Co-evaluation              AM-PAC OT "6 Clicks" Daily Activity     Outcome Measure Help from another person eating meals?: None Help from another person taking care of personal grooming?: None Help from another person toileting, which includes using toliet, bedpan, or urinal?: A Little Help from another person bathing (including washing, rinsing, drying)?: A Little Help from another person to put on and taking off regular upper body clothing?: None Help from another person to put on and taking off regular lower  body clothing?: A Little 6 Click Score: 21   End of Session Equipment Utilized During Treatment: Gait belt;Rolling walker Nurse Communication: Mobility status;Other (comment) (notified MD and TOC team of d/c rec HHOT)  Activity Tolerance: Patient tolerated treatment well Patient left: in chair;with call bell/phone within reach  OT Visit Diagnosis: Muscle weakness (generalized) (M62.81);Pain Pain - Right/Left: Right Pain - part of body: Knee                Time: 1001-1109 OT Time Calculation (min): 68 min Charges:  OT General Charges $OT Visit: 1 Visit OT Evaluation $OT Eval Moderate Complexity: 1 Mod OT Treatments $Self Care/Home Management : 23-37 mins $Therapeutic Activity: 23-37 mins  Gerrianne Scale, MS, OTR/L ascom [phone] 08/14/21, 12:11 PM

## 2021-08-15 DIAGNOSIS — M1711 Unilateral primary osteoarthritis, right knee: Secondary | ICD-10-CM | POA: Diagnosis not present

## 2021-08-17 ENCOUNTER — Emergency Department: Payer: Medicare HMO

## 2021-08-17 ENCOUNTER — Emergency Department
Admission: EM | Admit: 2021-08-17 | Discharge: 2021-08-18 | Disposition: A | Discharge status: Home / Self Care | Payer: Medicare HMO | Attending: Emergency Medicine | Admitting: Emergency Medicine

## 2021-08-17 DIAGNOSIS — E039 Hypothyroidism, unspecified: Secondary | ICD-10-CM | POA: Diagnosis not present

## 2021-08-17 DIAGNOSIS — I129 Hypertensive chronic kidney disease with stage 1 through stage 4 chronic kidney disease, or unspecified chronic kidney disease: Secondary | ICD-10-CM | POA: Diagnosis not present

## 2021-08-17 DIAGNOSIS — Z743 Need for continuous supervision: Secondary | ICD-10-CM | POA: Diagnosis not present

## 2021-08-17 DIAGNOSIS — N189 Chronic kidney disease, unspecified: Secondary | ICD-10-CM | POA: Insufficient documentation

## 2021-08-17 DIAGNOSIS — R4182 Altered mental status, unspecified: Secondary | ICD-10-CM | POA: Insufficient documentation

## 2021-08-17 DIAGNOSIS — F4325 Adjustment disorder with mixed disturbance of emotions and conduct: Secondary | ICD-10-CM | POA: Diagnosis not present

## 2021-08-17 DIAGNOSIS — R000 Tachycardia, unspecified: Secondary | ICD-10-CM | POA: Insufficient documentation

## 2021-08-17 DIAGNOSIS — R6889 Other general symptoms and signs: Secondary | ICD-10-CM | POA: Diagnosis not present

## 2021-08-17 DIAGNOSIS — Z79899 Other long term (current) drug therapy: Secondary | ICD-10-CM | POA: Diagnosis not present

## 2021-08-17 DIAGNOSIS — Z96652 Presence of left artificial knee joint: Secondary | ICD-10-CM | POA: Insufficient documentation

## 2021-08-17 DIAGNOSIS — T50904A Poisoning by unspecified drugs, medicaments and biological substances, undetermined, initial encounter: Secondary | ICD-10-CM | POA: Diagnosis not present

## 2021-08-17 DIAGNOSIS — R404 Transient alteration of awareness: Secondary | ICD-10-CM | POA: Diagnosis not present

## 2021-08-17 DIAGNOSIS — Z20822 Contact with and (suspected) exposure to covid-19: Secondary | ICD-10-CM | POA: Insufficient documentation

## 2021-08-17 DIAGNOSIS — I499 Cardiac arrhythmia, unspecified: Secondary | ICD-10-CM | POA: Diagnosis not present

## 2021-08-17 LAB — CBC
HCT: 41.2 % (ref 36.0–46.0)
Hemoglobin: 14.2 g/dL (ref 12.0–15.0)
MCH: 31 pg (ref 26.0–34.0)
MCHC: 34.5 g/dL (ref 30.0–36.0)
MCV: 90 fL (ref 80.0–100.0)
Platelets: 312 10*3/uL (ref 150–400)
RBC: 4.58 MIL/uL (ref 3.87–5.11)
RDW: 14.4 % (ref 11.5–15.5)
WBC: 8.1 10*3/uL (ref 4.0–10.5)
nRBC: 0 % (ref 0.0–0.2)

## 2021-08-17 LAB — RESP PANEL BY RT-PCR (FLU A&B, COVID) ARPGX2
Influenza A by PCR: NEGATIVE
Influenza B by PCR: NEGATIVE
SARS Coronavirus 2 by RT PCR: NEGATIVE

## 2021-08-17 LAB — TROPONIN I (HIGH SENSITIVITY): Troponin I (High Sensitivity): 8 ng/L (ref <18)

## 2021-08-17 LAB — ETHANOL: Alcohol, Ethyl (B): <10 mg/dL (ref <10)

## 2021-08-17 LAB — ACETAMINOPHEN LEVEL: Acetaminophen (Tylenol), Serum: <10 ug/mL — ABNORMAL LOW (ref 10–30)

## 2021-08-17 MED ORDER — CLONIDINE HCL 0.1 MG PO TABS
0.1000 mg | ORAL_TABLET | Freq: Once | ORAL | Status: AC
Start: 2021-08-17 — End: 2021-08-17
  Administered 2021-08-17: 0.1 mg via ORAL
  Filled 2021-08-17: qty 1

## 2021-08-17 NOTE — ED Notes (Signed)
Patient transported to CT 
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
## 2021-08-17 NOTE — ED Notes (Signed)
Pt return from CT.

## 2021-08-17 NOTE — ED Triage Notes (Signed)
Pt BIBA for AMS. Pt coming from home, EMS states a neighbor called because pt had called the neighbor stating she wanted to die and saying her vgoodbyes. Per EMS, pt became less responsive en route. Pt currently non verbal but obeys some commands. Hypertensive, symmetry noted throughout.

## 2021-08-17 NOTE — ED Notes (Signed)
Bedding changed 

## 2021-08-18 DIAGNOSIS — R4182 Altered mental status, unspecified: Secondary | ICD-10-CM | POA: Diagnosis not present

## 2021-08-18 DIAGNOSIS — F4325 Adjustment disorder with mixed disturbance of emotions and conduct: Secondary | ICD-10-CM | POA: Diagnosis present

## 2021-08-18 MED ORDER — ACETAMINOPHEN 325 MG PO TABS
650.0000 mg | ORAL_TABLET | Freq: Once | ORAL | Status: AC
  Administered 2021-08-18: 650 mg via ORAL
  Filled 2021-08-18: qty 2
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
## 2021-08-18 NOTE — Consult Note (Addendum)
[facility] Psych ED Discharge  08/18/2021 10:48 AM Erin Sutton  MRN:  [id number]  Method of visit?: Face to Face   Principal Problem: Adjustment disorder with mixed disturbance of emotions and conduct Discharge Diagnoses: Principal Problem:   Adjustment disorder with mixed disturbance of emotions and conduct   Subjective: Patient calm/cooperative on interview, but states she is irritated at "being in the hospital again and I'm unhappy with my treatment here. I wanted to go to [facility] but the ambulance said I had to come here".  Patient tells this Pryor Curia that "I didn't want to kill myself. My words were misconstrued. I called my friend, who is 58 yeas-old, and told her that if I'm going to be in this much pain all the time that I'm not interested in living anymore." Pt states that she said she told her friend that she didn't feel like talking, "and I told her I would talk to her later. I don't know why she thought I wanted to kill myself. It's against my religion" When asked about her recent episodes of altered mental status, patient says "Sometimes when my blood pressure gets really high I become incoherent." Patient does endorse that she has been depressed and anxious lately due to her being in constant pain (R knee). Patient states her depression is at a 0 out of 10 "because I'm too irritated to be depressed right now." With regards to R knee pain, patient states that she lives at a 10/10. She tells the team that she has knee replacement surgery scheduled for Wednesday (states that she had L knee replacement seven years ago and that it resolved her pain). Patient states that she has had poor sleep recently, saying that she only sleeps 2-3 hours "before I have to wake up to pee". Pt states her appetite is "not great". Throughout interview, patient frequently says "I know everyone thinks I'm crazy."  Pt does say that her daughter plans to come this week to help her post-knee surgery. Patient also says that  "people think I'm being overmedicated with my blood pressure meds", but does not specify who in her life feels this way. Patient states that she has not taken her prescribed Tramadol "because it doesn't work" and also denies having taken her prescribed ativan in the last 48 hours prior to this admission.   Collateral from daughter, Erin Sutton, reassuring that patient did not intend to end her life. Daughter states "I don't feel she would her life. She really does need that surgery Wednesday." Daughter also informs team that her mother's caretaker is named Erin Sutton, and that this woman has her phone. Daughter requesting hospital contact Erin Sutton to come get here mother from the hospital.   Total Time spent with patient:  1 hour  Past Psychiatric History: depression, anxiety  Past Medical History:  Past Medical History:  Diagnosis Date   Anxiety    Arthritis    Chronic kidney disease    Constipation    GERD (gastroesophageal reflux disease)    Hypertension    Hypothyroidism    Osteoporosis    Sleep apnea    Urinary incontinence     Past Surgical History:  Procedure Laterality Date   BREAST BIOPSY Right    neg/stereo   BREAST BIOPSY Left    neg/stereo   BREAST EXCISIONAL BIOPSY Right    neg   CATARACT EXTRACTION, BILATERAL     COLONOSCOPY WITH PROPOFOL N/A 07/02/2015   Procedure: COLONOSCOPY WITH PROPOFOL;  Surgeon: Manya Silvas,  MD;  Location: [facility] ENDOSCOPY;  Service: Endoscopy;  Laterality: N/A;   CORRECTION HAMMER TOE Right    ESOPHAGOGASTRODUODENOSCOPY (EGD) WITH PROPOFOL N/A 07/02/2015   Procedure: ESOPHAGOGASTRODUODENOSCOPY (EGD) WITH PROPOFOL;  Surgeon: Manya Silvas, MD;  Location: [facility] ENDOSCOPY;  Service: Endoscopy;  Laterality: N/A;   JOINT REPLACEMENT Left    knee   REPLACEMENT TOTAL KNEE Left    SKIN BIOPSY Right    Family History:  Family History  Problem Relation Age of Onset   Hypertension Mother    Diabetes Mother    Heart attack Mother    Asthma Mother     Peripheral vascular disease Mother    Heart attack Father    Hypertension Father    Diabetes Father    Colon cancer Maternal Aunt    Breast cancer Cousin    Family Psychiatric  History: none Social History:  Social History   Substance and Sexual Activity  Alcohol Use No     Social History   Substance and Sexual Activity  Drug Use No    Social History   Socioeconomic History   Marital status: Widowed    Spouse name: Not on file   Number of children: Not on file   Years of education: Not on file   Highest education level: Not on file  Occupational History   Not on file  Tobacco Use   Smoking status: Never   Smokeless tobacco: Never  Vaping Use   Vaping Use: Never used  Substance and Sexual Activity   Alcohol use: No   Drug use: No   Sexual activity: Not Currently  Other Topics Concern   Not on file  Social History Narrative   Patient lives at home by herself.  Ambulates with a cane.   Has a daughter who lives in [place] Determinants of Health   Financial Resource Strain: Not on file  Food Insecurity: Not on file  Transportation Needs: Not on file  Physical Activity: Not on file  Stress: Not on file  Social Connections: Not on file    Tobacco Cessation:  N/A, patient does not currently use tobacco products  Current Medications: No current facility-administered medications for this encounter.   Current Outpatient Medications  Medication Sig Dispense Refill   acetaminophen (TYLENOL) 500 MG tablet Take 2 tablets (1,000 mg total) by mouth 3 (three) times daily. 30 tablet 0   amLODipine (NORVASC) 10 MG tablet Take 10 mg by mouth daily.     apixaban (ELIQUIS) 5 MG TABS tablet Take 5 mg by mouth 2 (two) times daily.     atorvastatin (LIPITOR) 40 MG tablet Take 40 mg by mouth daily.     levothyroxine (SYNTHROID, LEVOTHROID) 50 MCG tablet Take 50 mcg by mouth daily before breakfast.     neomycin-polymyxin b-dexamethasone (MAXITROL) 4.2-35361-4.4  OINT 1 application in the morning and at bedtime. (Affected eye/eyes)     pantoprazole (PROTONIX) 40 MG tablet Take 40 mg by mouth daily.     Polyethyl Glycol-Propyl Glycol (SYSTANE OP) Place 1 drop into both eyes 2 (two) times daily.     polyethylene glycol powder (GLYCOLAX/MIRALAX) 17 GM/SCOOP powder Take 17 g by mouth daily.     senna-docusate (SENOKOT-S) 8.6-50 MG tablet Take 2 tablets by mouth 2 (two) times daily as needed for mild constipation. 100 tablet 0   spironolactone (ALDACTONE) 25 MG tablet Take 25 mg by mouth daily.     cyanocobalamin (,VITAMIN B-12,) 1000 MCG/ML injection Inject 1,000 mcg  into the muscle See admin instructions. Inject 1033mcg once weekly for 4 weeks then recheck by Dr Wynetta Emery     oxyCODONE (OXY IR/ROXICODONE) 5 MG immediate release tablet Take 0.5 tablets (2.5 mg total) by mouth every 8 (eight) hours as needed for up to 5 days for breakthrough pain. 8 tablet 0   PTA Medications: (Not in a hospital admission)   Musculoskeletal: Strength & Muscle Tone: within normal limits Gait & Station: normal Patient leans: N/A  Psychiatric Specialty Exam: Psychiatric Specialty Exam: Physical Exam Vitals and nursing note reviewed.  Constitutional:      Appearance: Normal appearance.    Review of Systems  Musculoskeletal:        Knee pain   Blood pressure (!) 163/77, pulse 70, temperature 98.3 F (36.8 C), temperature source Oral, resp. rate 13, height 5\' 4"  (1.626 m), weight 68.5 kg, SpO2 100 %.Body mass index is 25.92 kg/m.  General Appearance: Casual  Eye Contact:  Good  Speech:  Normal Rate  Volume:  Normal  Mood:  Anxious and Depressed  Affect:  Congruent  Thought Process:  Coherent and Descriptions of Associations: Intact  Orientation:  Full (Time, Place, and Person)  Thought Content:  WDL and Logical  Suicidal Thoughts:  No  Homicidal Thoughts:  No  Memory:  Immediate;   Good Recent;   Good Remote;   Good  Judgement:  Good  Insight:  Good   Psychomotor Activity:  Decreased  Concentration:  Concentration: Good and Attention Span: Good  Recall:  Good  Fund of Knowledge:  Good  Language:  Good  Akathisia:  No  Handed:  Right  AIMS (if indicated):     Assets:  Housing Leisure Time Physical Health Resilience Social Support  ADL's:  Intact  Cognition:  WNL  Sleep:         Physical Exam: Physical Exam Vitals and nursing note reviewed.  Constitutional:      Appearance: Normal appearance.   Review of Systems  Musculoskeletal:        Knee pain  Blood pressure (!) 163/77, pulse 70, temperature 98.3 F (36.8 C), temperature source Oral, resp. rate 13, height 5\' 4"  (1.626 m), weight 68.5 kg, SpO2 100 %. Body mass index is 25.92 kg/m.   Demographic Factors:  Age 89 or older, Caucasian, and Living alone  Loss Factors: Decline in physical health  Historical Factors: NA  Risk Reduction Factors:   Sense of responsibility to family and Positive social support  Continued Clinical Symptoms:  Depression, mild; anxiety, mild  Cognitive Features That Contribute To Risk:  None    Suicide Risk:  Minimal: No identifiable suicidal ideation.  Patients presenting with no risk factors but with morbid ruminations; may be classified as minimal risk based on the severity of the depressive symptoms    Plan Of Care/Follow-up recommendations:  Major depressive disorder, recurrent, mild: -Recommend therapy   Anxiety: -recommend a SSRI vs PRN Ativan, client declined Activity:  as tolerated Diet:  heart healthy diet  Disposition: discharge home Waylan Boga, NP 08/18/2021, 10:48 AM

## 2021-08-18 NOTE — ED Notes (Signed)
Pt Sleeping. NADN

## 2021-08-18 NOTE — ED Notes (Signed)
Pts care giver at bedside at this time. Care giver moved her leg and is now in pain requesting something for pain. Call light within reach. Will continue to monitor for changes.

## 2021-08-18 NOTE — ED Provider Notes (Signed)
I was asked by Dr. Kerman Passey to follow-up results of head CT which were unremarkable.  We will continue with his plan for psychiatric evaluation.  I have asked the pharmacy to verify patient's med so we can reorder them.       I have personally reviewed the images performed during this visit and I agree with the Radiologist's read.   Interpretation by Radiologist:  CT HEAD WO CONTRAST (5MM)  Result Date: 08/17/2021 CLINICAL DATA:  Mental status change EXAM: CT HEAD WITHOUT CONTRAST TECHNIQUE: Contiguous axial images were obtained from the base of the skull through the vertex without intravenous contrast. COMPARISON:  MRI 08/12/2021, CT brain 08/12/2021 FINDINGS: Brain: No acute territorial infarction, hemorrhage, or intracranial mass. 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 There is minimal chronic small vessel ischemic change of the white matter. The ventricles are stable in size Vascular: No hyperdense vessels.  Carotid vascular calcification Skull: Normal. Negative for fracture or focal lesion. Sinuses/Orbits: No acute finding. Other: None IMPRESSION: 1. No CT evidence for acute intracranial abnormality. 2. Minimal chronic small-vessel ischemic changes of the white matter Electronically Signed   By: Donavan Foil M.D.   On: 08/17/2021 20:30      Rudene Re, MD 08/18/21 [phone]

## 2021-08-18 NOTE — Discharge Instructions (Addendum)
Follow up with PCP for pain issues, surgery scheduled for Wednesday.

## 2021-08-22 DIAGNOSIS — I480 Paroxysmal atrial fibrillation: Secondary | ICD-10-CM | POA: Diagnosis not present

## 2021-08-22 DIAGNOSIS — I6523 Occlusion and stenosis of bilateral carotid arteries: Secondary | ICD-10-CM | POA: Diagnosis not present

## 2021-08-22 DIAGNOSIS — I10 Essential (primary) hypertension: Secondary | ICD-10-CM | POA: Diagnosis not present

## 2021-08-23 DIAGNOSIS — F0390 Unspecified dementia without behavioral disturbance: Secondary | ICD-10-CM | POA: Diagnosis not present

## 2021-08-23 DIAGNOSIS — M19011 Primary osteoarthritis, right shoulder: Secondary | ICD-10-CM | POA: Diagnosis not present

## 2021-08-23 DIAGNOSIS — M1711 Unilateral primary osteoarthritis, right knee: Secondary | ICD-10-CM | POA: Diagnosis not present

## 2021-08-23 DIAGNOSIS — I130 Hypertensive heart and chronic kidney disease with heart failure and stage 1 through stage 4 chronic kidney disease, or unspecified chronic kidney disease: Secondary | ICD-10-CM | POA: Diagnosis not present

## 2021-08-23 DIAGNOSIS — M810 Age-related osteoporosis without current pathological fracture: Secondary | ICD-10-CM | POA: Diagnosis not present

## 2021-08-23 DIAGNOSIS — K529 Noninfective gastroenteritis and colitis, unspecified: Secondary | ICD-10-CM | POA: Diagnosis not present

## 2021-08-23 DIAGNOSIS — N189 Chronic kidney disease, unspecified: Secondary | ICD-10-CM | POA: Diagnosis not present

## 2021-08-23 DIAGNOSIS — M19012 Primary osteoarthritis, left shoulder: Secondary | ICD-10-CM | POA: Diagnosis not present

## 2021-08-23 DIAGNOSIS — I5032 Chronic diastolic (congestive) heart failure: Secondary | ICD-10-CM | POA: Diagnosis not present

## 2021-08-26 DIAGNOSIS — I2699 Other pulmonary embolism without acute cor pulmonale: Secondary | ICD-10-CM | POA: Diagnosis not present

## 2021-08-26 DIAGNOSIS — I480 Paroxysmal atrial fibrillation: Secondary | ICD-10-CM | POA: Diagnosis not present

## 2021-08-26 DIAGNOSIS — Z09 Encounter for follow-up examination after completed treatment for conditions other than malignant neoplasm: Secondary | ICD-10-CM | POA: Diagnosis not present

## 2021-08-26 DIAGNOSIS — I10 Essential (primary) hypertension: Secondary | ICD-10-CM | POA: Diagnosis not present

## 2021-08-29 ENCOUNTER — Emergency Department
Admission: EM | Admit: 2021-08-29 | Discharge: 2021-08-30 | Disposition: A | Discharge status: Home / Self Care | Payer: Medicare HMO | Attending: Emergency Medicine | Admitting: Emergency Medicine

## 2021-08-29 ENCOUNTER — Other Ambulatory Visit: Payer: Self-pay

## 2021-08-29 ENCOUNTER — Emergency Department: Payer: Medicare HMO

## 2021-08-29 DIAGNOSIS — Z79899 Other long term (current) drug therapy: Secondary | ICD-10-CM | POA: Diagnosis not present

## 2021-08-29 DIAGNOSIS — N189 Chronic kidney disease, unspecified: Secondary | ICD-10-CM | POA: Diagnosis not present

## 2021-08-29 DIAGNOSIS — R059 Cough, unspecified: Secondary | ICD-10-CM | POA: Diagnosis not present

## 2021-08-29 DIAGNOSIS — F0390 Unspecified dementia without behavioral disturbance: Secondary | ICD-10-CM | POA: Diagnosis not present

## 2021-08-29 DIAGNOSIS — Z96652 Presence of left artificial knee joint: Secondary | ICD-10-CM | POA: Diagnosis not present

## 2021-08-29 DIAGNOSIS — Z20822 Contact with and (suspected) exposure to covid-19: Secondary | ICD-10-CM | POA: Diagnosis not present

## 2021-08-29 DIAGNOSIS — R404 Transient alteration of awareness: Secondary | ICD-10-CM | POA: Diagnosis not present

## 2021-08-29 DIAGNOSIS — I129 Hypertensive chronic kidney disease with stage 1 through stage 4 chronic kidney disease, or unspecified chronic kidney disease: Secondary | ICD-10-CM | POA: Diagnosis not present

## 2021-08-29 DIAGNOSIS — E039 Hypothyroidism, unspecified: Secondary | ICD-10-CM | POA: Insufficient documentation

## 2021-08-29 DIAGNOSIS — R6889 Other general symptoms and signs: Secondary | ICD-10-CM | POA: Diagnosis not present

## 2021-08-29 DIAGNOSIS — R4182 Altered mental status, unspecified: Secondary | ICD-10-CM | POA: Diagnosis present

## 2021-08-29 DIAGNOSIS — F29 Unspecified psychosis not due to a substance or known physiological condition: Secondary | ICD-10-CM | POA: Diagnosis not present

## 2021-08-29 DIAGNOSIS — R079 Chest pain, unspecified: Secondary | ICD-10-CM | POA: Diagnosis not present

## 2021-08-29 DIAGNOSIS — Z743 Need for continuous supervision: Secondary | ICD-10-CM | POA: Diagnosis not present

## 2021-08-29 LAB — COMPREHENSIVE METABOLIC PANEL
ALT: 17 U/L (ref 0–44)
AST: 30 U/L (ref 15–41)
Albumin: 4.2 g/dL (ref 3.5–5.0)
Alkaline Phosphatase: 70 U/L (ref 38–126)
Anion gap: 12 (ref 5–15)
BUN: 12 mg/dL (ref 8–23)
CO2: 24 mmol/L (ref 22–32)
Calcium: 10.3 mg/dL (ref 8.9–10.3)
Chloride: 102 mmol/L (ref 98–111)
Creatinine, Ser: 0.81 mg/dL (ref 0.44–1.00)
GFR, Estimated: >60 mL/min (ref >60)
Glucose, Bld: 103 mg/dL — ABNORMAL HIGH (ref 70–99)
Potassium: 3.9 mmol/L (ref 3.5–5.1)
Sodium: 138 mmol/L (ref 135–145)
Total Bilirubin: 0.8 mg/dL (ref 0.3–1.2)
Total Protein: 7.4 g/dL (ref 6.5–8.1)

## 2021-08-29 LAB — RESP PANEL BY RT-PCR (FLU A&B, COVID) ARPGX2
Influenza A by PCR: NEGATIVE
Influenza B by PCR: NEGATIVE
SARS Coronavirus 2 by RT PCR: NEGATIVE

## 2021-08-29 LAB — CBC WITH DIFFERENTIAL/PLATELET
Abs Immature Granulocytes: 0.01 10*3/uL (ref 0.00–0.07)
Basophils Absolute: 0 10*3/uL (ref 0.0–0.1)
Basophils Relative: 1 %
Eosinophils Absolute: 0.1 10*3/uL (ref 0.0–0.5)
Eosinophils Relative: 1 %
HCT: 38.2 % (ref 36.0–46.0)
Hemoglobin: 12.5 g/dL (ref 12.0–15.0)
Immature Granulocytes: 0 %
Lymphocytes Relative: 29 %
Lymphs Abs: 2 10*3/uL (ref 0.7–4.0)
MCH: 29.9 pg (ref 26.0–34.0)
MCHC: 32.7 g/dL (ref 30.0–36.0)
MCV: 91.4 fL (ref 80.0–100.0)
Monocytes Absolute: 0.6 10*3/uL (ref 0.1–1.0)
Monocytes Relative: 9 %
Neutro Abs: 4.2 10*3/uL (ref 1.7–7.7)
Neutrophils Relative %: 60 %
Platelets: 309 10*3/uL (ref 150–400)
RBC: 4.18 MIL/uL (ref 3.87–5.11)
RDW: 14.6 % (ref 11.5–15.5)
WBC: 6.9 10*3/uL (ref 4.0–10.5)
nRBC: 0 % (ref 0.0–0.2)

## 2021-08-29 LAB — LIPASE, BLOOD: Lipase: 39 U/L (ref 11–51)

## 2021-08-29 MED ORDER — ONDANSETRON HCL 4 MG/2ML IJ SOLN
4.0000 mg | Freq: Once | INTRAMUSCULAR | Status: AC
  Administered 2021-08-29: 4 mg via INTRAVENOUS
  Filled 2021-08-29: qty 2

## 2021-08-29 MED ORDER — SODIUM CHLORIDE 0.9 % IV BOLUS
500.0000 mL | Freq: Once | INTRAVENOUS | Status: AC
  Administered 2021-08-29: 500 mL via INTRAVENOUS

## 2021-08-29 NOTE — ED Triage Notes (Signed)
Pt BIB EMS from home for AMS. Pt A&O x4. Pt answers all questions appropriately. Pt was refusing transport by EMS at first, but agreed to come to the ER for evaluation. Pt has been seen multiple times for the same.
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
## 2021-08-29 NOTE — ED Notes (Signed)
Pt bedding changed. NADN

## 2021-08-29 NOTE — Discharge Instructions (Signed)
Your lab tests and COVID test were all normal today.  Please continue to follow-up with your primary care doctor.

## 2021-08-30 NOTE — ED Notes (Signed)
D/C papers at bedside

## 2021-08-30 NOTE — ED Notes (Addendum)
Called three contacts in chart. At this time unable to contact any family. Left voicemail.    Local contact for driver:   lessee dalson @ [phone]

## 2021-09-02 ENCOUNTER — Other Ambulatory Visit: Payer: Self-pay | Admitting: *Deleted

## 2021-09-02 DIAGNOSIS — I10 Essential (primary) hypertension: Secondary | ICD-10-CM

## 2021-09-02 NOTE — Patient Outreach (Signed)
[facility]) Care Management  09/02/2021  Erin Sutton [birth year] [id number]  CSW spoke with pt today who reports her TKR surgery was placed on hold due to BP issues. Per pt, she has some dizziness; and is not sure if it's vertigo or BP related.  Pt also shared she was in the hospital with some BP issues; "they thought I was psychotic because of how I was acting with my blood pressure issues". Pt denies any mental health concerns recently; is willing to pursue counseling (virtual/telephonic) due to not driving and so many other appointments she has to get rides for.  CSW provided pt with the # for [facility] again and she plans to call and request the tele-health visits.   Pt also shared that they made some medication changes and she is struggling with bad side effects (nauseated, drowsiness) and overall not feeling well. Per pt, she called PCP office on Friday and is awaiting a callback. Pt also plans to see PCP on Thursday of this week and will discuss further at that time.  CSW will place order for Pharmacy also.   CSW offered support to pt and will plan to follow up with pt in 3-4 weeks  [name], MSW, [place] Worker  [place] [phone]

## 2021-09-05 DIAGNOSIS — R4182 Altered mental status, unspecified: Secondary | ICD-10-CM | POA: Diagnosis not present

## 2021-09-05 DIAGNOSIS — F419 Anxiety disorder, unspecified: Secondary | ICD-10-CM | POA: Diagnosis not present

## 2021-09-17 DIAGNOSIS — M1711 Unilateral primary osteoarthritis, right knee: Secondary | ICD-10-CM | POA: Diagnosis not present

## 2021-09-21 DIAGNOSIS — M1711 Unilateral primary osteoarthritis, right knee: Secondary | ICD-10-CM | POA: Diagnosis not present

## 2021-09-21 DIAGNOSIS — I5032 Chronic diastolic (congestive) heart failure: Secondary | ICD-10-CM | POA: Diagnosis not present

## 2021-09-21 DIAGNOSIS — N189 Chronic kidney disease, unspecified: Secondary | ICD-10-CM | POA: Diagnosis not present

## 2021-09-21 DIAGNOSIS — K529 Noninfective gastroenteritis and colitis, unspecified: Secondary | ICD-10-CM | POA: Diagnosis not present

## 2021-09-21 DIAGNOSIS — F0390 Unspecified dementia without behavioral disturbance: Secondary | ICD-10-CM | POA: Diagnosis not present

## 2021-09-21 DIAGNOSIS — I130 Hypertensive heart and chronic kidney disease with heart failure and stage 1 through stage 4 chronic kidney disease, or unspecified chronic kidney disease: Secondary | ICD-10-CM | POA: Diagnosis not present

## 2021-09-21 DIAGNOSIS — M19012 Primary osteoarthritis, left shoulder: Secondary | ICD-10-CM | POA: Diagnosis not present

## 2021-09-21 DIAGNOSIS — M810 Age-related osteoporosis without current pathological fracture: Secondary | ICD-10-CM | POA: Diagnosis not present

## 2021-09-21 DIAGNOSIS — M19011 Primary osteoarthritis, right shoulder: Secondary | ICD-10-CM | POA: Diagnosis not present

## 2021-09-24 DIAGNOSIS — R42 Dizziness and giddiness: Secondary | ICD-10-CM | POA: Diagnosis not present

## 2021-09-24 DIAGNOSIS — H903 Sensorineural hearing loss, bilateral: Secondary | ICD-10-CM | POA: Diagnosis not present

## 2021-09-24 DIAGNOSIS — H9209 Otalgia, unspecified ear: Secondary | ICD-10-CM | POA: Diagnosis not present

## 2021-09-27 ENCOUNTER — Telehealth: Payer: Self-pay | Admitting: *Deleted

## 2021-09-27 DIAGNOSIS — A084 Viral intestinal infection, unspecified: Secondary | ICD-10-CM | POA: Diagnosis not present

## 2021-09-27 NOTE — Patient Outreach (Signed)
[facility]) Care Management  09/27/2021  JAMEY DEMCHAK 1940/12/07 [id number]   Unable to reach pt today. CSW will await callback or try again in 3-4 business days.  [name], MSW, [place] Worker  [place] [phone]

## 2021-10-02 ENCOUNTER — Telehealth: Payer: Self-pay | Admitting: *Deleted

## 2021-10-02 NOTE — Patient Outreach (Signed)
[facility]) Care Management  10/02/2021  Erin Sutton 09/27/1941 [id number]   Pt reports plans to have her TKR surgery on 10/09/21. She has not followed up with the [facility] counseling provider because she states she wants to wait until after the surgery. CSW alerted pt to plans to close referral as she has the # to call and schedule the counseling when/if she wants.  CSW also reminded pt to call CSW if needed.  CSW will sign off and advise PCP and [facility] team.   [name], MSW, [place] Worker  [place] [phone]

## 2021-10-08 DIAGNOSIS — I10 Essential (primary) hypertension: Secondary | ICD-10-CM | POA: Diagnosis not present

## 2021-10-08 DIAGNOSIS — M1711 Unilateral primary osteoarthritis, right knee: Secondary | ICD-10-CM | POA: Diagnosis not present

## 2021-10-08 DIAGNOSIS — Z86711 Personal history of pulmonary embolism: Secondary | ICD-10-CM | POA: Diagnosis not present

## 2021-10-08 DIAGNOSIS — F419 Anxiety disorder, unspecified: Secondary | ICD-10-CM | POA: Diagnosis not present

## 2021-10-08 DIAGNOSIS — I4891 Unspecified atrial fibrillation: Secondary | ICD-10-CM | POA: Diagnosis not present

## 2021-10-08 DIAGNOSIS — G4733 Obstructive sleep apnea (adult) (pediatric): Secondary | ICD-10-CM | POA: Diagnosis not present

## 2021-10-09 DIAGNOSIS — F419 Anxiety disorder, unspecified: Secondary | ICD-10-CM | POA: Diagnosis not present

## 2021-10-09 DIAGNOSIS — M25761 Osteophyte, right knee: Secondary | ICD-10-CM | POA: Diagnosis not present

## 2021-10-09 DIAGNOSIS — Z8673 Personal history of transient ischemic attack (TIA), and cerebral infarction without residual deficits: Secondary | ICD-10-CM | POA: Diagnosis not present

## 2021-10-09 DIAGNOSIS — I129 Hypertensive chronic kidney disease with stage 1 through stage 4 chronic kidney disease, or unspecified chronic kidney disease: Secondary | ICD-10-CM | POA: Diagnosis not present

## 2021-10-09 DIAGNOSIS — K219 Gastro-esophageal reflux disease without esophagitis: Secondary | ICD-10-CM | POA: Diagnosis not present

## 2021-10-09 DIAGNOSIS — N189 Chronic kidney disease, unspecified: Secondary | ICD-10-CM | POA: Diagnosis not present

## 2021-10-09 DIAGNOSIS — R42 Dizziness and giddiness: Secondary | ICD-10-CM | POA: Diagnosis not present

## 2021-10-09 DIAGNOSIS — M1711 Unilateral primary osteoarthritis, right knee: Secondary | ICD-10-CM | POA: Diagnosis not present

## 2021-10-09 DIAGNOSIS — E039 Hypothyroidism, unspecified: Secondary | ICD-10-CM | POA: Diagnosis not present

## 2021-10-09 DIAGNOSIS — I4891 Unspecified atrial fibrillation: Secondary | ICD-10-CM | POA: Diagnosis not present

## 2021-10-09 DIAGNOSIS — I951 Orthostatic hypotension: Secondary | ICD-10-CM | POA: Diagnosis not present

## 2021-10-09 DIAGNOSIS — E785 Hyperlipidemia, unspecified: Secondary | ICD-10-CM | POA: Diagnosis not present

## 2021-10-09 DIAGNOSIS — R110 Nausea: Secondary | ICD-10-CM | POA: Diagnosis not present

## 2021-10-09 DIAGNOSIS — D6489 Other specified anemias: Secondary | ICD-10-CM | POA: Diagnosis not present

## 2021-10-14 DIAGNOSIS — I482 Chronic atrial fibrillation, unspecified: Secondary | ICD-10-CM | POA: Diagnosis not present

## 2021-10-14 DIAGNOSIS — M6281 Muscle weakness (generalized): Secondary | ICD-10-CM | POA: Diagnosis not present

## 2021-10-14 DIAGNOSIS — F419 Anxiety disorder, unspecified: Secondary | ICD-10-CM | POA: Diagnosis not present

## 2021-10-14 DIAGNOSIS — Z96651 Presence of right artificial knee joint: Secondary | ICD-10-CM | POA: Diagnosis not present

## 2021-10-14 DIAGNOSIS — R112 Nausea with vomiting, unspecified: Secondary | ICD-10-CM | POA: Diagnosis not present

## 2021-10-14 DIAGNOSIS — G4733 Obstructive sleep apnea (adult) (pediatric): Secondary | ICD-10-CM | POA: Diagnosis not present

## 2021-10-14 DIAGNOSIS — M1711 Unilateral primary osteoarthritis, right knee: Secondary | ICD-10-CM | POA: Diagnosis not present

## 2021-10-14 DIAGNOSIS — I10 Essential (primary) hypertension: Secondary | ICD-10-CM | POA: Diagnosis not present

## 2021-10-15 DIAGNOSIS — I482 Chronic atrial fibrillation, unspecified: Secondary | ICD-10-CM | POA: Diagnosis not present

## 2021-10-15 DIAGNOSIS — I10 Essential (primary) hypertension: Secondary | ICD-10-CM | POA: Diagnosis not present

## 2021-10-15 DIAGNOSIS — R112 Nausea with vomiting, unspecified: Secondary | ICD-10-CM | POA: Diagnosis not present

## 2021-10-15 DIAGNOSIS — G4733 Obstructive sleep apnea (adult) (pediatric): Secondary | ICD-10-CM | POA: Diagnosis not present

## 2021-10-15 DIAGNOSIS — Z96651 Presence of right artificial knee joint: Secondary | ICD-10-CM | POA: Diagnosis not present

## 2021-10-15 DIAGNOSIS — M6281 Muscle weakness (generalized): Secondary | ICD-10-CM | POA: Diagnosis not present

## 2021-10-15 DIAGNOSIS — F419 Anxiety disorder, unspecified: Secondary | ICD-10-CM | POA: Diagnosis not present

## 2021-10-16 DIAGNOSIS — G4733 Obstructive sleep apnea (adult) (pediatric): Secondary | ICD-10-CM | POA: Diagnosis not present

## 2021-10-16 DIAGNOSIS — M6281 Muscle weakness (generalized): Secondary | ICD-10-CM | POA: Diagnosis not present

## 2021-10-16 DIAGNOSIS — I482 Chronic atrial fibrillation, unspecified: Secondary | ICD-10-CM | POA: Diagnosis not present

## 2021-10-16 DIAGNOSIS — Z96651 Presence of right artificial knee joint: Secondary | ICD-10-CM | POA: Diagnosis not present

## 2021-10-16 DIAGNOSIS — I10 Essential (primary) hypertension: Secondary | ICD-10-CM | POA: Diagnosis not present

## 2021-10-24 DIAGNOSIS — H9193 Unspecified hearing loss, bilateral: Secondary | ICD-10-CM | POA: Diagnosis not present

## 2021-10-24 DIAGNOSIS — I10 Essential (primary) hypertension: Secondary | ICD-10-CM | POA: Diagnosis not present

## 2021-10-24 DIAGNOSIS — F32A Depression, unspecified: Secondary | ICD-10-CM | POA: Diagnosis not present

## 2021-10-24 DIAGNOSIS — R4182 Altered mental status, unspecified: Secondary | ICD-10-CM | POA: Diagnosis not present

## 2021-10-24 DIAGNOSIS — H04129 Dry eye syndrome of unspecified lacrimal gland: Secondary | ICD-10-CM | POA: Diagnosis not present

## 2021-10-24 DIAGNOSIS — E039 Hypothyroidism, unspecified: Secondary | ICD-10-CM | POA: Diagnosis not present

## 2021-10-24 DIAGNOSIS — N390 Urinary tract infection, site not specified: Secondary | ICD-10-CM | POA: Diagnosis not present

## 2021-10-24 DIAGNOSIS — Z471 Aftercare following joint replacement surgery: Secondary | ICD-10-CM | POA: Diagnosis not present

## 2021-10-24 DIAGNOSIS — F411 Generalized anxiety disorder: Secondary | ICD-10-CM | POA: Diagnosis not present

## 2021-11-04 DIAGNOSIS — R479 Unspecified speech disturbances: Secondary | ICD-10-CM | POA: Diagnosis not present

## 2021-11-04 DIAGNOSIS — R531 Weakness: Secondary | ICD-10-CM | POA: Diagnosis not present

## 2021-11-04 DIAGNOSIS — G8929 Other chronic pain: Secondary | ICD-10-CM | POA: Diagnosis not present

## 2021-11-04 DIAGNOSIS — R6889 Other general symptoms and signs: Secondary | ICD-10-CM | POA: Diagnosis not present

## 2021-11-04 DIAGNOSIS — F339 Major depressive disorder, recurrent, unspecified: Secondary | ICD-10-CM | POA: Diagnosis not present

## 2021-11-04 DIAGNOSIS — R414 Neurologic neglect syndrome: Secondary | ICD-10-CM | POA: Diagnosis not present

## 2021-11-04 DIAGNOSIS — R4701 Aphasia: Secondary | ICD-10-CM | POA: Diagnosis not present

## 2021-11-04 DIAGNOSIS — R4020 Unspecified coma: Secondary | ICD-10-CM | POA: Diagnosis not present

## 2021-11-04 DIAGNOSIS — I480 Paroxysmal atrial fibrillation: Secondary | ICD-10-CM | POA: Diagnosis not present

## 2021-11-04 DIAGNOSIS — R42 Dizziness and giddiness: Secondary | ICD-10-CM | POA: Diagnosis not present

## 2021-11-04 DIAGNOSIS — I161 Hypertensive emergency: Secondary | ICD-10-CM | POA: Diagnosis not present

## 2021-11-04 DIAGNOSIS — Z743 Need for continuous supervision: Secondary | ICD-10-CM | POA: Diagnosis not present

## 2021-11-04 DIAGNOSIS — R29810 Facial weakness: Secondary | ICD-10-CM | POA: Diagnosis not present

## 2021-11-04 DIAGNOSIS — I129 Hypertensive chronic kidney disease with stage 1 through stage 4 chronic kidney disease, or unspecified chronic kidney disease: Secondary | ICD-10-CM | POA: Diagnosis not present

## 2021-11-04 DIAGNOSIS — R404 Transient alteration of awareness: Secondary | ICD-10-CM | POA: Diagnosis not present

## 2021-11-04 DIAGNOSIS — R4182 Altered mental status, unspecified: Secondary | ICD-10-CM | POA: Diagnosis not present

## 2021-11-04 DIAGNOSIS — M25551 Pain in right hip: Secondary | ICD-10-CM | POA: Diagnosis not present

## 2021-11-04 DIAGNOSIS — I6523 Occlusion and stenosis of bilateral carotid arteries: Secondary | ICD-10-CM | POA: Diagnosis not present

## 2021-11-04 DIAGNOSIS — Z20822 Contact with and (suspected) exposure to covid-19: Secondary | ICD-10-CM | POA: Diagnosis not present

## 2021-11-04 DIAGNOSIS — F0390 Unspecified dementia without behavioral disturbance: Secondary | ICD-10-CM | POA: Diagnosis not present

## 2021-11-04 DIAGNOSIS — R2689 Other abnormalities of gait and mobility: Secondary | ICD-10-CM | POA: Diagnosis not present

## 2021-11-04 DIAGNOSIS — G4489 Other headache syndrome: Secondary | ICD-10-CM | POA: Diagnosis not present

## 2021-11-05 DIAGNOSIS — M25551 Pain in right hip: Secondary | ICD-10-CM | POA: Diagnosis not present

## 2021-11-05 DIAGNOSIS — G8929 Other chronic pain: Secondary | ICD-10-CM | POA: Diagnosis not present

## 2021-11-05 DIAGNOSIS — R479 Unspecified speech disturbances: Secondary | ICD-10-CM | POA: Diagnosis not present

## 2021-11-05 DIAGNOSIS — R29818 Other symptoms and signs involving the nervous system: Secondary | ICD-10-CM | POA: Diagnosis not present

## 2021-11-05 DIAGNOSIS — R410 Disorientation, unspecified: Secondary | ICD-10-CM | POA: Diagnosis not present

## 2021-11-05 DIAGNOSIS — I6783 Posterior reversible encephalopathy syndrome: Secondary | ICD-10-CM | POA: Diagnosis not present

## 2021-11-05 DIAGNOSIS — I959 Hypotension, unspecified: Secondary | ICD-10-CM | POA: Diagnosis not present

## 2021-11-05 DIAGNOSIS — R4701 Aphasia: Secondary | ICD-10-CM | POA: Diagnosis not present

## 2021-11-05 DIAGNOSIS — R531 Weakness: Secondary | ICD-10-CM | POA: Diagnosis not present

## 2021-11-06 DIAGNOSIS — I959 Hypotension, unspecified: Secondary | ICD-10-CM | POA: Diagnosis not present

## 2021-11-06 DIAGNOSIS — M25551 Pain in right hip: Secondary | ICD-10-CM | POA: Diagnosis not present

## 2021-11-06 DIAGNOSIS — R4701 Aphasia: Secondary | ICD-10-CM | POA: Diagnosis not present

## 2021-11-06 DIAGNOSIS — R531 Weakness: Secondary | ICD-10-CM | POA: Diagnosis not present

## 2021-11-06 DIAGNOSIS — G8929 Other chronic pain: Secondary | ICD-10-CM | POA: Diagnosis not present

## 2021-11-06 DIAGNOSIS — I161 Hypertensive emergency: Secondary | ICD-10-CM | POA: Diagnosis not present

## 2021-11-07 DIAGNOSIS — R4701 Aphasia: Secondary | ICD-10-CM | POA: Diagnosis not present

## 2021-11-07 DIAGNOSIS — G8929 Other chronic pain: Secondary | ICD-10-CM | POA: Diagnosis not present

## 2021-11-07 DIAGNOSIS — M25551 Pain in right hip: Secondary | ICD-10-CM | POA: Diagnosis not present

## 2021-11-07 DIAGNOSIS — I959 Hypotension, unspecified: Secondary | ICD-10-CM | POA: Diagnosis not present

## 2021-11-07 DIAGNOSIS — R531 Weakness: Secondary | ICD-10-CM | POA: Diagnosis not present

## 2021-11-08 DIAGNOSIS — J90 Pleural effusion, not elsewhere classified: Secondary | ICD-10-CM | POA: Diagnosis not present

## 2021-11-08 DIAGNOSIS — R42 Dizziness and giddiness: Secondary | ICD-10-CM | POA: Diagnosis not present

## 2021-11-08 DIAGNOSIS — R5381 Other malaise: Secondary | ICD-10-CM | POA: Diagnosis not present

## 2021-11-08 DIAGNOSIS — F329 Major depressive disorder, single episode, unspecified: Secondary | ICD-10-CM | POA: Diagnosis not present

## 2021-11-08 DIAGNOSIS — M6281 Muscle weakness (generalized): Secondary | ICD-10-CM | POA: Diagnosis not present

## 2021-11-08 DIAGNOSIS — R2689 Other abnormalities of gait and mobility: Secondary | ICD-10-CM | POA: Diagnosis not present

## 2021-11-08 DIAGNOSIS — R279 Unspecified lack of coordination: Secondary | ICD-10-CM | POA: Diagnosis not present

## 2021-11-08 DIAGNOSIS — N183 Chronic kidney disease, stage 3 unspecified: Secondary | ICD-10-CM | POA: Diagnosis not present

## 2021-11-08 DIAGNOSIS — F339 Major depressive disorder, recurrent, unspecified: Secondary | ICD-10-CM | POA: Diagnosis not present

## 2021-11-08 DIAGNOSIS — R531 Weakness: Secondary | ICD-10-CM | POA: Diagnosis not present

## 2021-11-08 DIAGNOSIS — F419 Anxiety disorder, unspecified: Secondary | ICD-10-CM | POA: Diagnosis not present

## 2021-11-08 DIAGNOSIS — G8929 Other chronic pain: Secondary | ICD-10-CM | POA: Diagnosis not present

## 2021-11-08 DIAGNOSIS — F0390 Unspecified dementia without behavioral disturbance: Secondary | ICD-10-CM | POA: Diagnosis not present

## 2021-11-08 DIAGNOSIS — E039 Hypothyroidism, unspecified: Secondary | ICD-10-CM | POA: Diagnosis not present

## 2021-11-08 DIAGNOSIS — I161 Hypertensive emergency: Secondary | ICD-10-CM | POA: Diagnosis not present

## 2021-11-08 DIAGNOSIS — Z20822 Contact with and (suspected) exposure to covid-19: Secondary | ICD-10-CM | POA: Diagnosis not present

## 2021-11-08 DIAGNOSIS — I129 Hypertensive chronic kidney disease with stage 1 through stage 4 chronic kidney disease, or unspecified chronic kidney disease: Secondary | ICD-10-CM | POA: Diagnosis not present

## 2021-11-08 DIAGNOSIS — R4701 Aphasia: Secondary | ICD-10-CM | POA: Diagnosis not present

## 2021-11-08 DIAGNOSIS — E876 Hypokalemia: Secondary | ICD-10-CM | POA: Diagnosis not present

## 2021-11-08 DIAGNOSIS — I480 Paroxysmal atrial fibrillation: Secondary | ICD-10-CM | POA: Diagnosis not present

## 2021-11-08 DIAGNOSIS — F0393 Unspecified dementia, unspecified severity, with mood disturbance: Secondary | ICD-10-CM | POA: Diagnosis not present

## 2021-11-08 DIAGNOSIS — M25561 Pain in right knee: Secondary | ICD-10-CM | POA: Diagnosis not present

## 2021-11-11 ENCOUNTER — Other Ambulatory Visit: Payer: Self-pay

## 2021-11-11 ENCOUNTER — Inpatient Hospital Stay
Admission: EM | Admit: 2021-11-11 | Discharge: 2021-11-14 | DRG: 078 | Disposition: A | Discharge status: Home / Self Care | Payer: Medicare HMO | Attending: Internal Medicine | Admitting: Internal Medicine

## 2021-11-11 ENCOUNTER — Encounter: Payer: Self-pay | Admitting: Emergency Medicine

## 2021-11-11 ENCOUNTER — Emergency Department: Payer: Medicare HMO

## 2021-11-11 DIAGNOSIS — F0390 Unspecified dementia without behavioral disturbance: Secondary | ICD-10-CM | POA: Diagnosis not present

## 2021-11-11 DIAGNOSIS — Z743 Need for continuous supervision: Secondary | ICD-10-CM | POA: Diagnosis not present

## 2021-11-11 DIAGNOSIS — E039 Hypothyroidism, unspecified: Secondary | ICD-10-CM | POA: Diagnosis present

## 2021-11-11 DIAGNOSIS — Z7901 Long term (current) use of anticoagulants: Secondary | ICD-10-CM

## 2021-11-11 DIAGNOSIS — N390 Urinary tract infection, site not specified: Secondary | ICD-10-CM | POA: Diagnosis present

## 2021-11-11 DIAGNOSIS — N189 Chronic kidney disease, unspecified: Secondary | ICD-10-CM | POA: Diagnosis present

## 2021-11-11 DIAGNOSIS — Z79890 Hormone replacement therapy (postmenopausal): Secondary | ICD-10-CM

## 2021-11-11 DIAGNOSIS — J9611 Chronic respiratory failure with hypoxia: Secondary | ICD-10-CM | POA: Diagnosis not present

## 2021-11-11 DIAGNOSIS — R531 Weakness: Secondary | ICD-10-CM | POA: Diagnosis not present

## 2021-11-11 DIAGNOSIS — I959 Hypotension, unspecified: Secondary | ICD-10-CM | POA: Diagnosis not present

## 2021-11-11 DIAGNOSIS — Z882 Allergy status to sulfonamides status: Secondary | ICD-10-CM | POA: Diagnosis not present

## 2021-11-11 DIAGNOSIS — M810 Age-related osteoporosis without current pathological fracture: Secondary | ICD-10-CM | POA: Diagnosis present

## 2021-11-11 DIAGNOSIS — G9340 Encephalopathy, unspecified: Secondary | ICD-10-CM | POA: Diagnosis present

## 2021-11-11 DIAGNOSIS — I674 Hypertensive encephalopathy: Secondary | ICD-10-CM | POA: Diagnosis not present

## 2021-11-11 DIAGNOSIS — Z888 Allergy status to other drugs, medicaments and biological substances status: Secondary | ICD-10-CM | POA: Diagnosis not present

## 2021-11-11 DIAGNOSIS — Z79899 Other long term (current) drug therapy: Secondary | ICD-10-CM

## 2021-11-11 DIAGNOSIS — G4733 Obstructive sleep apnea (adult) (pediatric): Secondary | ICD-10-CM | POA: Diagnosis present

## 2021-11-11 DIAGNOSIS — I480 Paroxysmal atrial fibrillation: Secondary | ICD-10-CM | POA: Diagnosis not present

## 2021-11-11 DIAGNOSIS — I129 Hypertensive chronic kidney disease with stage 1 through stage 4 chronic kidney disease, or unspecified chronic kidney disease: Secondary | ICD-10-CM | POA: Diagnosis not present

## 2021-11-11 DIAGNOSIS — R5381 Other malaise: Secondary | ICD-10-CM | POA: Diagnosis present

## 2021-11-11 DIAGNOSIS — Z8249 Family history of ischemic heart disease and other diseases of the circulatory system: Secondary | ICD-10-CM

## 2021-11-11 DIAGNOSIS — Z881 Allergy status to other antibiotic agents status: Secondary | ICD-10-CM

## 2021-11-11 DIAGNOSIS — Z20822 Contact with and (suspected) exposure to covid-19: Secondary | ICD-10-CM | POA: Diagnosis not present

## 2021-11-11 DIAGNOSIS — Z86718 Personal history of other venous thrombosis and embolism: Secondary | ICD-10-CM

## 2021-11-11 DIAGNOSIS — Z880 Allergy status to penicillin: Secondary | ICD-10-CM

## 2021-11-11 DIAGNOSIS — Z885 Allergy status to narcotic agent status: Secondary | ICD-10-CM | POA: Diagnosis not present

## 2021-11-11 DIAGNOSIS — R404 Transient alteration of awareness: Secondary | ICD-10-CM | POA: Diagnosis not present

## 2021-11-11 DIAGNOSIS — F0394 Unspecified dementia, unspecified severity, with anxiety: Secondary | ICD-10-CM | POA: Diagnosis present

## 2021-11-11 DIAGNOSIS — Z9981 Dependence on supplemental oxygen: Secondary | ICD-10-CM | POA: Diagnosis not present

## 2021-11-11 DIAGNOSIS — Z91048 Other nonmedicinal substance allergy status: Secondary | ICD-10-CM

## 2021-11-11 DIAGNOSIS — F419 Anxiety disorder, unspecified: Secondary | ICD-10-CM | POA: Diagnosis not present

## 2021-11-11 DIAGNOSIS — R4182 Altered mental status, unspecified: Secondary | ICD-10-CM

## 2021-11-11 DIAGNOSIS — R6889 Other general symptoms and signs: Secondary | ICD-10-CM | POA: Diagnosis not present

## 2021-11-11 DIAGNOSIS — Z96652 Presence of left artificial knee joint: Secondary | ICD-10-CM | POA: Diagnosis present

## 2021-11-11 DIAGNOSIS — Z86711 Personal history of pulmonary embolism: Secondary | ICD-10-CM

## 2021-11-11 DIAGNOSIS — R8281 Pyuria: Secondary | ICD-10-CM | POA: Diagnosis not present

## 2021-11-11 DIAGNOSIS — K219 Gastro-esophageal reflux disease without esophagitis: Secondary | ICD-10-CM | POA: Diagnosis present

## 2021-11-11 DIAGNOSIS — J9811 Atelectasis: Secondary | ICD-10-CM | POA: Diagnosis not present

## 2021-11-11 LAB — URINALYSIS, ROUTINE W REFLEX MICROSCOPIC
Bilirubin Urine: NEGATIVE
Glucose, UA: NEGATIVE mg/dL
Ketones, ur: 5 mg/dL — AB
Nitrite: NEGATIVE
Protein, ur: 30 mg/dL — AB
Specific Gravity, Urine: 1.006 (ref 1.005–1.030)
pH: 7 (ref 5.0–8.0)

## 2021-11-11 LAB — CBC WITH DIFFERENTIAL/PLATELET
Abs Immature Granulocytes: 0.02 10*3/uL (ref 0.00–0.07)
Basophils Absolute: 0 10*3/uL (ref 0.0–0.1)
Basophils Relative: 1 %
Eosinophils Absolute: 0 10*3/uL (ref 0.0–0.5)
Eosinophils Relative: 1 %
HCT: 42.7 % (ref 36.0–46.0)
Hemoglobin: 13.7 g/dL (ref 12.0–15.0)
Immature Granulocytes: 0 %
Lymphocytes Relative: 16 %
Lymphs Abs: 1.1 10*3/uL (ref 0.7–4.0)
MCH: 28.6 pg (ref 26.0–34.0)
MCHC: 32.1 g/dL (ref 30.0–36.0)
MCV: 89.1 fL (ref 80.0–100.0)
Monocytes Absolute: 0.4 10*3/uL (ref 0.1–1.0)
Monocytes Relative: 7 %
Neutro Abs: 5 10*3/uL (ref 1.7–7.7)
Neutrophils Relative %: 75 %
Platelets: 281 10*3/uL (ref 150–400)
RBC: 4.79 MIL/uL (ref 3.87–5.11)
RDW: 15.8 % — ABNORMAL HIGH (ref 11.5–15.5)
WBC: 6.6 10*3/uL (ref 4.0–10.5)
nRBC: 0 % (ref 0.0–0.2)

## 2021-11-11 LAB — URINE DRUG SCREEN, QUALITATIVE (ARMC ONLY)
Amphetamines, Ur Screen: NOT DETECTED
Barbiturates, Ur Screen: NOT DETECTED
Benzodiazepine, Ur Scrn: NOT DETECTED
Cannabinoid 50 Ng, Ur ~~LOC~~: NOT DETECTED
Cocaine Metabolite,Ur ~~LOC~~: NOT DETECTED
MDMA (Ecstasy)Ur Screen: NOT DETECTED
Methadone Scn, Ur: NOT DETECTED
Opiate, Ur Screen: POSITIVE — AB
Phencyclidine (PCP) Ur S: NOT DETECTED
Tricyclic, Ur Screen: NOT DETECTED

## 2021-11-11 LAB — COMPREHENSIVE METABOLIC PANEL
ALT: 15 U/L (ref 0–44)
AST: 31 U/L (ref 15–41)
Albumin: 5 g/dL (ref 3.5–5.0)
Alkaline Phosphatase: 80 U/L (ref 38–126)
Anion gap: 11 (ref 5–15)
BUN: 14 mg/dL (ref 8–23)
CO2: 26 mmol/L (ref 22–32)
Calcium: 11.2 mg/dL — ABNORMAL HIGH (ref 8.9–10.3)
Chloride: 101 mmol/L (ref 98–111)
Creatinine, Ser: 0.71 mg/dL (ref 0.44–1.00)
GFR, Estimated: >60 mL/min (ref >60)
Glucose, Bld: 108 mg/dL — ABNORMAL HIGH (ref 70–99)
Potassium: 3.5 mmol/L (ref 3.5–5.1)
Sodium: 138 mmol/L (ref 135–145)
Total Bilirubin: 0.9 mg/dL (ref 0.3–1.2)
Total Protein: 8.6 g/dL — ABNORMAL HIGH (ref 6.5–8.1)

## 2021-11-11 LAB — TROPONIN I (HIGH SENSITIVITY)
Troponin I (High Sensitivity): 9 ng/L (ref <18)
Troponin I (High Sensitivity): 9 ng/L (ref <18)

## 2021-11-11 LAB — RESP PANEL BY RT-PCR (FLU A&B, COVID) ARPGX2
Influenza A by PCR: NEGATIVE
Influenza B by PCR: NEGATIVE
SARS Coronavirus 2 by RT PCR: NEGATIVE

## 2021-11-11 MED ORDER — ONDANSETRON HCL 4 MG PO TABS
4.0000 mg | ORAL_TABLET | Freq: Four times a day (QID) | ORAL | Status: DC | PRN
  Administered 2021-11-12 – 2021-11-13 (×3): 4 mg via ORAL
  Filled 2021-11-11 (×3): qty 1

## 2021-11-11 MED ORDER — ACETAMINOPHEN 650 MG RE SUPP: 650.0000 mg | Freq: Four times a day (QID) | RECTAL | Status: DC | PRN

## 2021-11-11 MED ORDER — SODIUM CHLORIDE 0.9 % IV BOLUS
1000.0000 mL | Freq: Once | INTRAVENOUS | Status: AC
  Administered 2021-11-11: 13:00:00 1000 mL via INTRAVENOUS

## 2021-11-11 MED ORDER — ATORVASTATIN CALCIUM 20 MG PO TABS
40.0000 mg | ORAL_TABLET | Freq: Every day | ORAL | Status: DC
  Administered 2021-11-11 – 2021-11-14 (×4): 40 mg via ORAL
  Filled 2021-11-11 (×4): qty 2

## 2021-11-11 MED ORDER — METOPROLOL TARTRATE 25 MG PO TABS
25.0000 mg | ORAL_TABLET | Freq: Two times a day (BID) | ORAL | Status: DC
  Administered 2021-11-11 – 2021-11-14 (×6): 25 mg via ORAL
  Filled 2021-11-11 (×7): qty 1

## 2021-11-11 MED ORDER — METOPROLOL TARTRATE 5 MG/5ML IV SOLN
5.0000 mg | Freq: Once | INTRAVENOUS | Status: AC
  Administered 2021-11-11: 14:00:00 5 mg via INTRAVENOUS
  Filled 2021-11-11: qty 5

## 2021-11-11 MED ORDER — ACETAMINOPHEN 500 MG PO TABS
1000.0000 mg | ORAL_TABLET | Freq: Three times a day (TID) | ORAL | Status: DC
  Administered 2021-11-11 – 2021-11-14 (×6): 1000 mg via ORAL
  Filled 2021-11-11 (×8): qty 2

## 2021-11-11 MED ORDER — CLONIDINE HCL 0.2 MG/24HR TD PTWK
0.2000 mg | MEDICATED_PATCH | TRANSDERMAL | Status: DC
  Administered 2021-11-11: 17:00:00 0.2 mg via TRANSDERMAL
  Filled 2021-11-11: qty 1

## 2021-11-11 MED ORDER — SODIUM CHLORIDE 0.9 % IV SOLN
1.0000 g | Freq: Once | INTRAVENOUS | Status: AC
  Administered 2021-11-11: 13:00:00 1 g via INTRAVENOUS
  Filled 2021-11-11: qty 10

## 2021-11-11 MED ORDER — SODIUM CHLORIDE 0.9 % IV SOLN
1.0000 g | INTRAVENOUS | Status: AC
  Administered 2021-11-12 – 2021-11-13 (×2): 1 g via INTRAVENOUS
  Filled 2021-11-11 (×2): qty 10

## 2021-11-11 MED ORDER — NALOXONE HCL 2 MG/2ML IJ SOSY
0.4000 mg | PREFILLED_SYRINGE | Freq: Once | INTRAMUSCULAR | Status: AC
  Administered 2021-11-11: 12:00:00 0.4 mg via INTRAVENOUS
  Filled 2021-11-11: qty 2

## 2021-11-11 MED ORDER — ACETAMINOPHEN 325 MG PO TABS
650.0000 mg | ORAL_TABLET | Freq: Four times a day (QID) | ORAL | Status: DC | PRN
  Administered 2021-11-12 – 2021-11-14 (×3): 650 mg via ORAL
  Filled 2021-11-11 (×3): qty 2

## 2021-11-11 MED ORDER — APIXABAN 5 MG PO TABS
5.0000 mg | ORAL_TABLET | Freq: Two times a day (BID) | ORAL | Status: DC
  Administered 2021-11-11 – 2021-11-14 (×6): 5 mg via ORAL
  Filled 2021-11-11 (×7): qty 1

## 2021-11-11 MED ORDER — LEVOTHYROXINE SODIUM 50 MCG PO TABS
50.0000 ug | ORAL_TABLET | Freq: Every day | ORAL | Status: DC
  Administered 2021-11-12 – 2021-11-14 (×3): 50 ug via ORAL
  Filled 2021-11-11 (×3): qty 1

## 2021-11-11 MED ORDER — PANTOPRAZOLE SODIUM 40 MG PO TBEC
40.0000 mg | DELAYED_RELEASE_TABLET | Freq: Every day | ORAL | Status: DC
  Administered 2021-11-11 – 2021-11-14 (×4): 40 mg via ORAL
  Filled 2021-11-11 (×4): qty 1

## 2021-11-11 MED ORDER — ONDANSETRON HCL 4 MG/2ML IJ SOLN
4.0000 mg | Freq: Four times a day (QID) | INTRAMUSCULAR | Status: DC | PRN
  Administered 2021-11-12: 08:00:00 4 mg via INTRAVENOUS
  Filled 2021-11-11: qty 2

## 2021-11-11 MED ORDER — MELATONIN 5 MG PO TABS
10.0000 mg | ORAL_TABLET | Freq: Every evening | ORAL | Status: DC | PRN
  Administered 2021-11-11 – 2021-11-13 (×3): 10 mg via ORAL
  Filled 2021-11-11 (×4): qty 2

## 2021-11-11 NOTE — Assessment & Plan Note (Signed)
Reportedly has dementia.  Patient need [facility].  Likely will need assisted living placement.

## 2021-11-11 NOTE — ED Notes (Signed)
Patient to CT at this time

## 2021-11-11 NOTE — ED Notes (Signed)
Patient placed on pure wick °

## 2021-11-11 NOTE — Assessment & Plan Note (Signed)
FeverUnclear if she has UTI.  Patient without her leukocytosis however she is altered.  We will give her 3 doses of IV Rocephin.  Urine culture sent.

## 2021-11-11 NOTE — H&P (Signed)
History and Physical    Erin Sutton MWN:[id number] DOB: Mar 25, 1941 DOA: 11/11/2021  PCP: Baxter Hire, MD   Patient coming from: Home  I have personally briefly reviewed patient's old medical records in [place]  CC: altered mental status HPI: 80 year old female with a history of reported dementia, paroxysmal atrial fibrillation on Eliquis, anxiety, history of uncontrolled hypertension presents to the ER today via EMS.  Patient unable to give history or review of systems due to confusion and altered mental status.  According to care everywhere, she was admitted to [facility] from 11/04/2021 through 11/08/2021.  She was discharged to a nursing home on the 16th.  Reportedly the patient signed herself out at the nursing home on 17 December.  During her hospitalization at [facility], patient's blood pressure medications were changed so that she was on Lopressor 25 mg twice daily for her blood pressure.  Apparently she was seen by neurology and they started her on Vimpat 100 mg twice daily due to concern for possible seizure disorder although patient never had a documented seizure there.  Patient states that she was picked up at the nursing home by Nils Flack who is the daughter of one of her cousins.  I attempted to call Magda Paganini at [phone] and was unable to reach her.  Patient was given 0.4 mg of IV Narcan which did not seem to change her altered mental status.  Her urine drug screen was positive for opiates but negative for benzos.  Patient was able to state that she did take hydrocodone yesterday.  She is not oriented to to place, time.  While she was at [facility] work-up for pheochromocytoma was undertaken due to uncontrolled blood pressure.  Currently she has a 24-hour urine for metanephrines and catecholamines pending.  Along with free plasma metanephrines.  Patient is able to state that she does have a daughter that lives in [place].  Patient states that she does not  want to live in [place] with her daughter.  Patient noted to be hypertensive on arrival.  Initial blood pressure was 194/86.  Several hours into her ER stay it peaked at 220/95.  5 mg of IV Lopressor was ordered.  Due to the patient's altered mental status and possible hypertensive encephalopathy, [facility] hospital[facility] contacted for admission.  Should be noted that during her hospital course at [facility], when she was placed on labetalol and amlodipine, she became hypotensive requiring IV fluid bolus.   ED Course: Hypotensive and altered on admission to the ER.  Given 0.4 mg of IV Narcan without any improvement in her mentation.  Review of Systems:  Review of Systems  Unable to perform ROS: Mental acuity   Past Medical History:  Diagnosis Date   Anxiety    Arthritis    Chronic kidney disease    Constipation    GERD (gastroesophageal reflux disease)    Hypertension    Hypothyroidism    Osteoporosis    Sleep apnea    Urinary incontinence     Past Surgical History:  Procedure Laterality Date   BREAST BIOPSY Right    neg/stereo   BREAST BIOPSY Left    neg/stereo   BREAST EXCISIONAL BIOPSY Right    neg   CATARACT EXTRACTION, BILATERAL     COLONOSCOPY WITH PROPOFOL N/A 07/02/2015   Procedure: COLONOSCOPY WITH PROPOFOL;  Surgeon: Manya Silvas, MD;  Location: [facility];  Service: Endoscopy;  Laterality: N/A;   CORRECTION HAMMER TOE Right    ESOPHAGOGASTRODUODENOSCOPY (  EGD) WITH PROPOFOL N/A 07/02/2015   Procedure: ESOPHAGOGASTRODUODENOSCOPY (EGD) WITH PROPOFOL;  Surgeon: Manya Silvas, MD;  Location: [facility] ENDOSCOPY;  Service: Endoscopy;  Laterality: N/A;   JOINT REPLACEMENT Left    knee   REPLACEMENT TOTAL KNEE Left    SKIN BIOPSY Right      reports that she has never smoked. She has never used smokeless tobacco. She reports that she does not drink alcohol and does not use drugs.  Allergies  Allergen Reactions   Ace Inhibitors Cough   Levofloxacin    Penicillin G  Other (See Comments)   Tape Other (See Comments)    Other reaction(s): Other (See Comments)   Amoxicillin Rash    Has patient had a PCN reaction causing immediate rash, facial/tongue/throat swelling, SOB or lightheadedness with hypotension: No Has patient had a PCN reaction causing severe rash involving mucus membranes or skin necrosis: No Has patient had a PCN reaction that required hospitalization: No Has patient had a PCN reaction occurring within the last 10 years: No If all of the above answers are "NO", then may proceed with Cephalosporin use.    Benicar [Olmesartan] Rash    Itching rash   Codeine Rash   Codeine Sulfate Rash   Levaquin [Levofloxacin In D5w] Rash    Alters mental status   Penicillin V Potassium Rash    Has patient had a PCN reaction causing immediate rash, facial/tongue/throat swelling, SOB or lightheadedness with hypotension: No Has patient had a PCN reaction causing severe rash involving mucus membranes or skin necrosis: No Has patient had a PCN reaction that required hospitalization: No Has patient had a PCN reaction occurring within the last 10 years: No If all of the above answers are "NO", then may proceed with Cephalosporin use.    Sulfa Antibiotics Rash    Family History  Problem Relation Age of Onset   Hypertension Mother    Diabetes Mother    Heart attack Mother    Asthma Mother    Peripheral vascular disease Mother    Heart attack Father    Hypertension Father    Diabetes Father    Colon cancer Maternal Aunt    Breast cancer Cousin     Prior to Admission medications   Medication Sig Start Date End Date Taking? Authorizing Provider  diclofenac Sodium (VOLTAREN) 1 % GEL Apply 1 application topically daily. 11/08/21  Yes [provider]  melatonin 3 MG TABS tablet Take 1 tablet by mouth at bedtime as needed. 11/08/21  Yes [provider]  thiamine 100 MG tablet Take 1 tablet by mouth daily. 11/08/21  Yes [provider]  Zinc Oxide 40 % PSTE Apply 1 application topically 2 (two) times daily as needed. 11/08/21  Yes [provider]  acetaminophen (TYLENOL) 500 MG tablet Take 2 tablets (1,000 mg total) by mouth 3 (three) times daily. 08/14/21   Danford, Suann Larry, MD  amLODipine (NORVASC) 10 MG tablet Take 10 mg by mouth daily.    [provider]  apixaban (ELIQUIS) 5 MG TABS tablet Take 5 mg by mouth 2 (two) times daily. Patient not taking: Reported on 11/11/2021    [provider]  atorvastatin (LIPITOR) 40 MG tablet Take 40 mg by mouth daily.    [provider]  cyanocobalamin (,VITAMIN B-12,) 1000 MCG/ML injection Inject 1,000 mcg into the muscle See admin instructions. Inject 1061mcg once weekly for 4 weeks then recheck by Dr Wynetta Emery 05/06/21   [provider]  labetalol (  NORMODYNE) 100 MG tablet Take 100 mg by mouth 2 (two) times daily. 08/26/21   [provider]  Lacosamide 100 MG TABS Take 1 tablet by mouth in the morning and at bedtime. Patient not taking: Reported on 11/11/2021 11/08/21   [provider]  levothyroxine (SYNTHROID, LEVOTHROID) 50 MCG tablet Take 50 mcg by mouth daily before breakfast.    [provider]  metoprolol tartrate (LOPRESSOR) 25 MG tablet Take 1 tablet by mouth in the morning and at bedtime. Patient not taking: Reported on 11/11/2021 11/08/21 12/08/21  [provider]  neomycin-polymyxin b-dexamethasone (MAXITROL) 2.8-78676-7.2 OINT 1 application in the morning and at bedtime. (Affected eye/eyes) Patient not taking: Reported on 11/11/2021    [provider]  pantoprazole (PROTONIX) 40 MG tablet Take 40 mg by mouth daily.    [provider]  Polyethyl Glycol-Propyl Glycol (SYSTANE OP) Place 1 drop into both eyes 2 (two) times daily.    [provider]  polyethylene glycol powder (GLYCOLAX/MIRALAX) 17 GM/SCOOP powder Take 17 g by mouth daily. 08/03/20   [provider]  potassium chloride SA (KLOR-CON M) 20 MEQ tablet Take 20 mEq by mouth daily. 07/11/21   [provider]  promethazine (PHENERGAN) 12.5 MG tablet Take 12.5 mg by mouth 2 (two) times daily. Patient not taking: Reported on 11/11/2021 08/20/21   [provider]  senna-docusate (SENOKOT-S) 8.6-50 MG tablet Take 2 tablets by mouth 2 (two) times daily as needed for mild constipation. 04/15/20   Sharen Hones, MD  spironolactone (ALDACTONE) 25 MG tablet Take 25 mg by mouth daily. 04/04/21   [provider]  traMADol (ULTRAM) 50 MG tablet Take 50 mg by mouth every 6 (six) hours as needed. Patient not taking: Reported on 11/11/2021 07/23/21   [provider]    Physical Exam: Vitals:   11/11/21 1153 11/11/21 1230 11/11/21 1330 11/11/21 1400  BP: (!) 194/86 (!) 164/90 (!) 198/92 (!) 220/95  Pulse: 95 86 97 93  Resp: 17 (!) 21 (!) 27 (!) 23  Temp: 98.4 F (36.9 C)     TempSrc: Oral     SpO2: 98% 99% 96% 100%  Weight:      Height:        Physical Exam Vitals and nursing note reviewed.  Constitutional:      General: She is not in acute distress.    Appearance: She is ill-appearing. She is not toxic-appearing or diaphoretic.     Comments: Chronically ill-appearing elderly female appears older than stated age of 48.  Arousable with verbal and physical stimuli but quickly falls back to sleep.  HENT:     Head: Normocephalic and atraumatic.     Nose: Nose normal. No rhinorrhea.  Eyes:     General:        Right eye: No discharge.        Left eye: No discharge.  Cardiovascular:     Rate and Rhythm: Normal rate. Rhythm irregular.     Pulses: Normal pulses.  Pulmonary:     Effort: Pulmonary effort is normal. No respiratory distress.     Breath sounds: No wheezing or rales.  Abdominal:     General: Bowel sounds are normal. There is no distension.     Tenderness: There is no abdominal tenderness. There is no guarding or rebound.  Musculoskeletal:     Right  lower leg: No edema.     Left lower leg: No edema.  Skin:    General: Skin is  warm and dry.     Capillary Refill: Capillary refill takes less than 2 seconds.  Neurological:     Mental Status: She is disoriented.     Labs on Admission: I have personally reviewed following labs and imaging studies  CBC: Recent Labs  Lab 11/11/21 1156  WBC 6.6  NEUTROABS 5.0  HGB 13.7  HCT 42.7  MCV 89.1  PLT 175   Basic Metabolic Panel: Recent Labs  Lab 11/11/21 1156  NA 138  K 3.5  CL 101  CO2 26  GLUCOSE 108*  BUN 14  CREATININE 0.71  CALCIUM 11.2*   GFR: Estimated Creatinine Clearance: 47.3 mL/min (by C-G formula based on SCr of 0.71 mg/dL). Liver Function Tests: Recent Labs  Lab 11/11/21 1156  AST 31  ALT 15  ALKPHOS 80  BILITOT 0.9  PROT 8.6*  ALBUMIN 5.0   No results for input(s): LIPASE, AMYLASE in the last 168 hours. No results for input(s): AMMONIA in the last 168 hours. Coagulation Profile: No results for input(s): INR, PROTIME in the last 168 hours. Cardiac Enzymes: No results for input(s): CKTOTAL, CKMB, CKMBINDEX, TROPONINI in the last 168 hours. BNP (last 3 results) No results for input(s): PROBNP in the last 8760 hours. HbA1C: No results for input(s): HGBA1C in the last 72 hours. CBG: No results for input(s): GLUCAP in the last 168 hours. Lipid Profile: No results for input(s): CHOL, HDL, LDLCALC, TRIG, CHOLHDL, LDLDIRECT in the last 72 hours. Thyroid Function Tests: No results for input(s): TSH, T4TOTAL, FREET4, T3FREE, THYROIDAB in the last 72 hours. Anemia Panel: No results for input(s): VITAMINB12, FOLATE, FERRITIN, TIBC, IRON, RETICCTPCT in the last 72 hours. Urine analysis:    Component Value Date/Time   COLORURINE STRAW (A) 11/11/2021 1156   APPEARANCEUR CLEAR (A) 11/11/2021 1156   APPEARANCEUR Hazy 07/25/2014 1350   LABSPEC 1.006 11/11/2021 1156   LABSPEC 1.020 07/25/2014 1350   PHURINE 7.0 11/11/2021 [place] 11/11/2021  1156   GLUCOSEU Negative 07/25/2014 1350   HGBUR SMALL (A) 11/11/2021 1156   BILIRUBINUR NEGATIVE 11/11/2021 1156   BILIRUBINUR Negative 07/25/2014 1350   KETONESUR 5 (A) 11/11/2021 1156   PROTEINUR 30 (A) 11/11/2021 1156   NITRITE NEGATIVE 11/11/2021 1156   LEUKOCYTESUR MODERATE (A) 11/11/2021 1156   LEUKOCYTESUR 2+ 07/25/2014 1350    Radiological Exams on Admission: I have personally reviewed images CT Head Wo Contrast  Result Date: 11/11/2021 CLINICAL DATA:  Weakness.  Mental status changes. EXAM: CT HEAD WITHOUT CONTRAST TECHNIQUE: Contiguous axial images were obtained from the base of the skull through the vertex without intravenous contrast. COMPARISON:  08/17/2021 FINDINGS: Brain: No mass lesion, hemorrhage, hydrocephalus, acute infarct, intra-axial, or extra-axial fluid collection. Vascular: No hyperdense vessel or unexpected calcification. Intracranial atherosclerosis. Skull: No significant soft tissue swelling.  No skull fracture. Sinuses/Orbits: Normal imaged portions of the orbits and globes. Clear paranasal sinuses and mastoid air cells. Other: None. IMPRESSION: No acute intracranial abnormality. Electronically Signed   By: Abigail Miyamoto M.D.   On: 11/11/2021 12:55   DG Chest Portable 1 View  Result Date: 11/11/2021 CLINICAL DATA:  Weakness, altered mental status, recent knee surgery EXAM: PORTABLE CHEST 1 VIEW COMPARISON:  Portable exam 1201 hours compared to 08/29/2021 FINDINGS: Normal heart size, mediastinal contours, and pulmonary vascularity. Atherosclerotic calcification aorta. Minimal atelectasis LEFT base. No definite infiltrate, pleural effusion, or pneumothorax. Bones demineralized IMPRESSION: Minimal LEFT basilar atelectasis. Aortic Atherosclerosis (ICD10-I70.0). Electronically Signed   By: Crist Infante.D.  On: 11/11/2021 12:13    EKG: I have personally reviewed EKG: no EKG performed  Assessment/Plan Principal Problem:   Hypertensive encephalopathy Active  Problems:   Hypothyroidism   GERD (gastroesophageal reflux disease)   Anxiety   Dementia (HCC)   Paroxysmal atrial fibrillation (HCC)   Chronic anticoagulation - on eliquis for afib    Hypertensive encephalopathy Assigned to observation telemetry bed.  Patient started on IV Lopressor.  We will restart her Lopressor 25 mg twice daily.  Patient probably has not been taking any of her blood pressure medications this weekend.  We will need to restart her blood pressure medication slowly as not to cause any profound hypotension like she had at [facility].  Appears the patient is unsafe to be living by herself.  We will need to get her family involved to see if patient needs to be moved to assisted living for long-term care.  Hypothyroidism Stable.  TSH was 0.8 on December 13,2022 at [facility].  Continue Synthroid.  GERD (gastroesophageal reflux disease) Continue Protonix.  Anxiety Chronic.  Dementia ([place]) Reportedly has dementia.  Patient need [facility].  Likely will need assisted living placement.  Chronic anticoagulation - on eliquis for afib Continue Eliquis  Paroxysmal atrial fibrillation (HCC) Continue Lopressor and Eliquis.  DVT prophylaxis: Eliquis Code Status: Full Code by default Family Communication: attempted to call Nils Flack [phone] that pt states in the one who picked her up from SNF  Disposition Plan: ALF vs SNF  Consults called: none  Admission status: Observation, Telemetry bed   Kristopher Oppenheim, DO [facility] 11/11/2021, 2:32 PM

## 2021-11-11 NOTE — ED Notes (Signed)
Attempted x2 for new IV with no success.

## 2021-11-11 NOTE — ED Triage Notes (Signed)
Patient to ED via [facility] after [place] called for wellness check. Patient checked herself out on Saturday after arriving on Friday for rehab after knee surgery. Patient has history of overmedicating self per EMS. Some confusion noted. BP 231/109. History of BP and a fib.

## 2021-11-11 NOTE — Assessment & Plan Note (Signed)
Stable.  TSH was 0.8 on December 13,2022 at [facility].  Continue Synthroid.
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
## 2021-11-11 NOTE — ED Notes (Signed)
IV team line infiltrated left forearm swollen and painful. New IV team order placed at this time.

## 2021-11-11 NOTE — Subjective & Objective (Addendum)
CC: altered mental status HPI: 80 year old female with a history of reported dementia, paroxysmal atrial fibrillation on Eliquis, anxiety, history of uncontrolled hypertension presents to the ER today via EMS.  Patient unable to give history or review of systems due to confusion and altered mental status.  According to care everywhere, she was admitted to [facility] from 11/04/2021 through 11/08/2021.  She was discharged to a nursing home on the 16th.  Reportedly the patient signed herself out at the nursing home on 17 December.  During her hospitalization at [facility], patient's blood pressure medications were changed so that she was on Lopressor 25 mg twice daily for her blood pressure.  Apparently she was seen by neurology and they started her on Vimpat 100 mg twice daily due to concern for possible seizure disorder although patient never had a documented seizure there.  Patient states that she was picked up at the nursing home by Nils Flack who is the daughter of one of her cousins.  I attempted to call Magda Paganini at [phone] and was unable to reach her.  Patient was given 0.4 mg of IV Narcan which did not seem to change her altered mental status.  Her urine drug screen was positive for opiates but negative for benzos.  Patient was able to state that she did take hydrocodone yesterday.  She is not oriented to to place, time.  While she was at [facility], work-up for pheochromocytoma was undertaken due to uncontrolled blood pressure.  Currently she has a 24-hour urine for metanephrines and catecholamines pending.  Along with free plasma metanephrines.  Patient is able to state that she does have a daughter that lives in [place].  Patient states that she does not want to live in [place] with her daughter.  Patient noted to be hypertensive on arrival.  Initial blood pressure was 194/86.  Several hours into her ER stay it peaked at 220/95.  5 mg of IV Lopressor was ordered.  Due to the  patient's altered mental status and possible hypertensive encephalopathy, [facility] hospitalist contacted for admission.  Should be noted that during her hospital course at [facility], when she was placed on labetalol and amlodipine, she became hypotensive requiring IV fluid bolus.

## 2021-11-11 NOTE — Assessment & Plan Note (Signed)
-   Continue Eliquis 

## 2021-11-11 NOTE — ED Notes (Signed)
Patient readjusted in bed. No needs expressed at this time. 

## 2021-11-11 NOTE — ED Notes (Signed)
Pt given warm blankets per her request.

## 2021-11-11 NOTE — ED Notes (Signed)
Patient given warm blankets at this time.  

## 2021-11-11 NOTE — Assessment & Plan Note (Signed)
Continue Protonix °

## 2021-11-11 NOTE — Assessment & Plan Note (Signed)
Continue Lopressor and Eliquis.

## 2021-11-11 NOTE — Assessment & Plan Note (Signed)
Assigned to observation telemetry bed.  Patient started on IV Lopressor.  We will restart her Lopressor 25 mg twice daily.  Patient probably has not been taking any of her blood pressure medications this weekend.  We will need to restart her blood pressure medication slowly as not to cause any profound hypotension like she had at [facility].  Appears the patient is unsafe to be living by herself.  We will need to get her family involved to see if patient needs to be moved to assisted living for long-term care.

## 2021-11-11 NOTE — ED Notes (Signed)
Patient sleeping O2 dropped to 58% on RA. This RN at bedside and woke patient up. O2 came back up to 98%. Charna Archer, MD made aware. Narcan and VBG ordered.

## 2021-11-11 NOTE — Assessment & Plan Note (Signed)
Chronic. 

## 2021-11-12 LAB — CBC WITH DIFFERENTIAL/PLATELET
Abs Immature Granulocytes: 0.02 10*3/uL (ref 0.00–0.07)
Basophils Absolute: 0 10*3/uL (ref 0.0–0.1)
Basophils Relative: 1 %
Eosinophils Absolute: 0.1 10*3/uL (ref 0.0–0.5)
Eosinophils Relative: 2 %
HCT: 33.3 % — ABNORMAL LOW (ref 36.0–46.0)
Hemoglobin: 10.9 g/dL — ABNORMAL LOW (ref 12.0–15.0)
Immature Granulocytes: 0 %
Lymphocytes Relative: 26 %
Lymphs Abs: 1.9 10*3/uL (ref 0.7–4.0)
MCH: 29.2 pg (ref 26.0–34.0)
MCHC: 32.7 g/dL (ref 30.0–36.0)
MCV: 89.3 fL (ref 80.0–100.0)
Monocytes Absolute: 0.8 10*3/uL (ref 0.1–1.0)
Monocytes Relative: 11 %
Neutro Abs: 4.3 10*3/uL (ref 1.7–7.7)
Neutrophils Relative %: 60 %
Platelets: 256 10*3/uL (ref 150–400)
RBC: 3.73 MIL/uL — ABNORMAL LOW (ref 3.87–5.11)
RDW: 16 % — ABNORMAL HIGH (ref 11.5–15.5)
WBC: 7.2 10*3/uL (ref 4.0–10.5)
nRBC: 0 % (ref 0.0–0.2)

## 2021-11-12 LAB — URINE CULTURE: Culture: NO GROWTH

## 2021-11-12 LAB — COMPREHENSIVE METABOLIC PANEL
ALT: 11 U/L (ref 0–44)
AST: 20 U/L (ref 15–41)
Albumin: 3.8 g/dL (ref 3.5–5.0)
Alkaline Phosphatase: 56 U/L (ref 38–126)
Anion gap: 9 (ref 5–15)
BUN: 15 mg/dL (ref 8–23)
CO2: 26 mmol/L (ref 22–32)
Calcium: 9.8 mg/dL (ref 8.9–10.3)
Chloride: 102 mmol/L (ref 98–111)
Creatinine, Ser: 0.76 mg/dL (ref 0.44–1.00)
GFR, Estimated: >60 mL/min (ref >60)
Glucose, Bld: 95 mg/dL (ref 70–99)
Potassium: 3.5 mmol/L (ref 3.5–5.1)
Sodium: 137 mmol/L (ref 135–145)
Total Bilirubin: 0.9 mg/dL (ref 0.3–1.2)
Total Protein: 6.6 g/dL (ref 6.5–8.1)

## 2021-11-12 LAB — ETHANOL: Alcohol, Ethyl (B): <10 mg/dL (ref <10)

## 2021-11-12 MED ORDER — TRAMADOL HCL 50 MG PO TABS
50.0000 mg | ORAL_TABLET | Freq: Once | ORAL | Status: AC
  Administered 2021-11-12: 50 mg via ORAL
  Filled 2021-11-12: qty 1

## 2021-11-12 MED ORDER — LORAZEPAM 0.5 MG PO TABS
0.5000 mg | ORAL_TABLET | Freq: Three times a day (TID) | ORAL | Status: DC | PRN
  Administered 2021-11-12 – 2021-11-13 (×2): 0.5 mg via ORAL
  Filled 2021-11-12 (×3): qty 1

## 2021-11-12 MED ORDER — LABETALOL HCL 5 MG/ML IV SOLN: 10.0000 mg | INTRAVENOUS | Status: DC | PRN

## 2021-11-12 NOTE — Progress Notes (Signed)
PT Cancellation Note  Patient Details Name: Erin Sutton MRN: [id number] DOB: Jul 21, 1941   Cancelled Treatment:    Reason Eval/Treat Not Completed: Other (comment);Medical issues which prohibited therapy.  Pt with elevated BP to be 167/92 at rest and communicated with nursing.  Nursing and therapist agreed pt BP being too elevated at this time to continue with therapy and pt was moving to floor soon.  Pt will be re-attempted at later date/time as medically appropriate.     [name], PT, DPT 11/12/21, 12:51 PM

## 2021-11-12 NOTE — ED Notes (Signed)
Messaged IPMD the following: Pt has been asking for pain medication other than Tylenol for her right knee pain of 10. She has been frustrated and tearful tonight due to this.  Please advise.  Also, her resting HR has been dipping into the low 50's.

## 2021-11-12 NOTE — ED Notes (Signed)
Tammy, daughter, given update on patient status.

## 2021-11-12 NOTE — ED Notes (Signed)
HR trending back in 60/70s.

## 2021-11-12 NOTE — ED Notes (Signed)
Pt resting in bed, alert and oriented talking to this RN.

## 2021-11-12 NOTE — Progress Notes (Signed)
PROGRESS NOTE    Erin Sutton  ZOX:[id number] DOB: Mar 02, 1941 DOA: 11/11/2021 PCP: Baxter Hire, MD    Brief Narrative:  80 year old with history of dementia, paroxysmal A. fib on Eliquis, anxiety, uncontrolled hypertension who was recently admitted to [facility] and discharged to a skilled nursing facility signed off from SNF on 12/17, went home.  Her skilled nursing facility actually called EMS to go home and do a welfare check on her.  EMS found her confused with blood pressure more than 200 and they brought to ER.   Assessment & Plan:   Principal Problem:   Hypertensive encephalopathy Active Problems:   Hypothyroidism   GERD (gastroesophageal reflux disease)   Anxiety   Dementia (HCC)   Paroxysmal atrial fibrillation (HCC)   Chronic anticoagulation - on eliquis for afib   Pyuria  Hypertensive encephalopathy, accelerated hypertension: Patient not taking medications at home.  On multiple antihypertensives.  Reported drop in blood pressure by previous medications. Started on clonidine 0.2 mg/24-hour patch to improve compliance. Metoprolol 25 twice daily. We will keep on as needed medications.  Goal blood pressure less than 160 today.  With history of sudden drop in blood pressure, will keep systolic blood pressure 811-914. Mental status has improved and normalized.  Hypothyroidism GERD Paroxysmal A. fib on Eliquis Stable medical issues.  Continue home medications.  Physical deconditioning and debility: Need to work with PT OT.  Anticipate referral back to SNF.  Chronic hypoxemic respiratory failure: Uses 2 L oxygen at home.  Mostly at night.  Continue.  Suspected UTI present on admission: On Rocephin.  Continue until urine cultures.   DVT prophylaxis: SCDs Start: 11/11/21 1534 apixaban (ELIQUIS) tablet 5 mg   Code Status: Full code Family Communication: None Disposition Plan: Status is: Observation  The patient remains OBS appropriate and will d/c before 2  midnights. Stays in the hospital due to unsafe disposition plan.       Consultants:  None  Procedures:  None  Antimicrobials:  Rocephin 12/19---   Subjective: Patient seen and examined.  She is more awake today.  She tells me her story.  She tells me that she left AMA from nursing home because they were not giving her rehab and her insurance was not paying for it so they were being charged. She does have recent right knee replacement and has some difficulty with mobility. She tells me that she would need more help.     Objective: Vitals:   11/12/21 0830 11/12/21 0930 11/12/21 0931 11/12/21 1140  BP: (!) 176/85 116/63 116/63 108/82  Pulse: 79 (!) 54 82 66  Resp: 14 11  18   Temp:    98 F (36.7 C)  TempSrc:    Oral  SpO2: 98% 97%  100%  Weight:      Height:        Intake/Output Summary (Last 24 hours) at 11/12/2021 1308 Last data filed at 11/11/2021 2049 Gross per 24 hour  Intake --  Output 500 ml  Net -500 ml   Filed Weights   11/11/21 1150  Weight: 65.3 kg    Examination:  General: Chronically sick looking.  Debilitated.  Not in distress. Cardiovascular: S1-S2 normal. Respiratory: Bilateral clear.  On 2 L oxygen. Gastrointestinal: Soft.  Nontender.  Obese and pendulous. Ext: No cyanosis or edema.  Right knee recent surgical incision clean and dry. Neuro: No focal deficits.  Alert oriented x4.  Flat affect.    Data Reviewed: I have personally reviewed following labs and  imaging studies  CBC: Recent Labs  Lab 11/11/21 1156 11/12/21 0625  WBC 6.6 7.2  NEUTROABS 5.0 4.3  HGB 13.7 10.9*  HCT 42.7 33.3*  MCV 89.1 89.3  PLT 281 158   Basic Metabolic Panel: Recent Labs  Lab 11/11/21 1156 11/12/21 0625  NA 138 137  K 3.5 3.5  CL 101 102  CO2 26 26  GLUCOSE 108* 95  BUN 14 15  CREATININE 0.71 0.76  CALCIUM 11.2* 9.8   GFR: Estimated Creatinine Clearance: 47.3 mL/min (by C-G formula based on SCr of 0.76 mg/dL). Liver Function  Tests: Recent Labs  Lab 11/11/21 1156 11/12/21 0625  AST 31 20  ALT 15 11  ALKPHOS 80 56  BILITOT 0.9 0.9  PROT 8.6* 6.6  ALBUMIN 5.0 3.8   No results for input(s): LIPASE, AMYLASE in the last 168 hours. No results for input(s): AMMONIA in the last 168 hours. Coagulation Profile: No results for input(s): INR, PROTIME in the last 168 hours. Cardiac Enzymes: No results for input(s): CKTOTAL, CKMB, CKMBINDEX, TROPONINI in the last 168 hours. BNP (last 3 results) No results for input(s): PROBNP in the last 8760 hours. HbA1C: No results for input(s): HGBA1C in the last 72 hours. CBG: No results for input(s): GLUCAP in the last 168 hours. Lipid Profile: No results for input(s): CHOL, HDL, LDLCALC, TRIG, CHOLHDL, LDLDIRECT in the last 72 hours. Thyroid Function Tests: No results for input(s): TSH, T4TOTAL, FREET4, T3FREE, THYROIDAB in the last 72 hours. Anemia Panel: No results for input(s): VITAMINB12, FOLATE, FERRITIN, TIBC, IRON, RETICCTPCT in the last 72 hours. Sepsis Labs: No results for input(s): PROCALCITON, LATICACIDVEN in the last 168 hours.  Recent Results (from the past 240 hour(s))  Resp Panel by RT-PCR (Flu A&B, Covid) Nasopharyngeal Swab     Status: None   Collection Time: 11/11/21 11:56 AM   Specimen: Nasopharyngeal Swab; Nasopharyngeal(NP) swabs in vial transport medium  Result Value Ref Range Status   SARS Coronavirus 2 by RT PCR NEGATIVE NEGATIVE Final    Comment: (NOTE) SARS-CoV-2 target nucleic acids are NOT DETECTED.  The SARS-CoV-2 RNA is generally detectable in upper respiratory specimens during the acute phase of infection. The lowest concentration of SARS-CoV-2 viral copies this assay can detect is 138 copies/mL. A negative result does not preclude SARS-Cov-2 infection and should not be used as the sole basis for treatment or other patient management decisions. A negative result may occur with  improper specimen collection/handling, submission of  specimen other than nasopharyngeal swab, presence of viral mutation(s) within the areas targeted by this assay, and inadequate number of viral copies(<138 copies/mL). A negative result must be combined with clinical observations, patient history, and epidemiological information. The expected result is Negative.  Fact Sheet for Patients:  EntrepreneurPulse.com.au  Fact Sheet for Healthcare Providers:  IncredibleEmployment.be  This test is no t yet approved or cleared by the [place] FDA and  has been authorized for detection and/or diagnosis of SARS-CoV-2 by FDA under an Emergency Use Authorization (EUA). This EUA will remain  in effect (meaning this test can be used) for the duration of the COVID-19 declaration under Section 564(b)(1) of the Act, 21 U.S.C.section 360bbb-3(b)(1), unless the authorization is terminated  or revoked sooner.       Influenza A by PCR NEGATIVE NEGATIVE Final   Influenza B by PCR NEGATIVE NEGATIVE Final    Comment: (NOTE) The Xpert Xpress SARS-CoV-2/FLU/RSV plus assay is intended as an aid in the diagnosis of influenza from Nasopharyngeal swab specimens  and should not be used as a sole basis for treatment. Nasal washings and aspirates are unacceptable for Xpert Xpress SARS-CoV-2/FLU/RSV testing.  Fact Sheet for Patients: EntrepreneurPulse.com.au  Fact Sheet for Healthcare Providers: IncredibleEmployment.be  This test is not yet approved or cleared by the [place] FDA and has been authorized for detection and/or diagnosis of SARS-CoV-2 by FDA under an Emergency Use Authorization (EUA). This EUA will remain in effect (meaning this test can be used) for the duration of the COVID-19 declaration under Section 564(b)(1) of the Act, 21 U.S.C. section 360bbb-3(b)(1), unless the authorization is terminated or revoked.  Performed at [facility], [address] [place], [place] [address]   Urine Culture     Status: None   Collection Time: 11/11/21 11:56 AM   Specimen: Urine, Clean Catch  Result Value Ref Range Status   Specimen Description   Final    URINE, CLEAN CATCH Performed at [facility], [address], [place], [place] [address]    Special Requests   Final    NONE Performed at [facility], [address], [place], [place] [address]    Culture   Final    NO GROWTH Performed at [facility], [place] [address], [place], [place] [address]    Report Status 11/12/2021 FINAL  Final         Radiology Studies: CT Head Wo Contrast  Result Date: 11/11/2021 CLINICAL DATA:  Weakness.  Mental status changes. EXAM: CT HEAD WITHOUT CONTRAST TECHNIQUE: Contiguous axial images were obtained from the base of the skull through the vertex without intravenous contrast. COMPARISON:  08/17/2021 FINDINGS: Brain: No mass lesion, hemorrhage, hydrocephalus, acute infarct, intra-axial, or extra-axial fluid collection. Vascular: No hyperdense vessel or unexpected calcification. Intracranial atherosclerosis. Skull: No significant soft tissue swelling.  No skull fracture. Sinuses/Orbits: Normal imaged portions of the orbits and globes. Clear paranasal sinuses and mastoid air cells. Other: None. IMPRESSION: No acute intracranial abnormality. Electronically Signed   By: Abigail Miyamoto M.D.   On: 11/11/2021 12:55   DG Chest Portable 1 View  Result Date: 11/11/2021 CLINICAL DATA:  Weakness, altered mental status, recent knee surgery EXAM: PORTABLE CHEST 1 VIEW COMPARISON:  Portable exam 1201 hours compared to 08/29/2021 FINDINGS: Normal heart size, mediastinal contours, and pulmonary vascularity. Atherosclerotic calcification aorta. Minimal atelectasis LEFT base. No definite infiltrate, pleural effusion, or pneumothorax. Bones demineralized IMPRESSION: Minimal LEFT basilar atelectasis. Aortic Atherosclerosis (ICD10-I70.0). Electronically Signed    By: Lavonia Dana M.D.   On: 11/11/2021 12:13        Scheduled Meds:  acetaminophen  1,000 mg Oral TID   apixaban  5 mg Oral BID   atorvastatin  40 mg Oral Daily   cloNIDine  0.2 mg Transdermal Weekly   levothyroxine  50 mcg Oral QAC breakfast   metoprolol tartrate  25 mg Oral BID   pantoprazole  40 mg Oral Daily   Continuous Infusions:  cefTRIAXone (ROCEPHIN)  IV Stopped (11/12/21 0824)     LOS: 0 days    Time spent: 25 minutes    Barb Merino, MD [facility] Pager [phone]

## 2021-11-12 NOTE — Evaluation (Signed)
Occupational Therapy Evaluation Patient Details Name: Erin Sutton MRN: [id number] DOB: 05/19/1941 Today's Date: 11/12/2021   History of Present Illness Pt is an 80 year old female with a history of reported dementia, paroxysmal atrial fibrillation on Eliquis, anxiety, history of uncontrolled hypertension who presents to the ER via EMS due to confusion and altered mental status. Patient noted to be hypertensive on arrival: initial blood pressure was 194/86, peaking several hours into her ER stay at 220/95. Pt had been admitted at [facility] from 11/04/2021 through 11/08/2021 for uncontrolled hypertension. Pt DCed from [facility] to a SNF on 11/08/2021 but left facility AMA on 12/17.   Clinical Impression   Pt received in bed, BP 110/75 in sitting. She is able to perform bed mobility, grooming, UB and LB dressing, toileting, w/in room ambulation with RW, all with Mod I-SUPV. Provided educ re: importance of being knowledgeable about and taking medications as prescribed, monitoring BP, giving careful attention/consideration to health care advice, and developing strategies for taking care of health care needs prior to emergencies arising. Pt appears currently to be at baseline level of fxl mobility, but can provide OT during hospitalization to reinforce skills and education, with HHOT post DC to assist pt with medication mgmt, mobility, in-home safety, falls preventions, and to minimize risk of re-hospitalization.   Recommendations for follow up therapy are one component of a multi-disciplinary discharge planning process, led by the attending physician.  Recommendations may be updated based on patient status, additional functional criteria and insurance authorization.   Follow Up Recommendations  Home health OT    Assistance Recommended at Discharge Intermittent Supervision/Assistance  Functional Status Assessment  Patient has had a recent decline in their functional status and demonstrates  the ability to make significant improvements in function in a reasonable and predictable amount of time.  Equipment Recommendations  None recommended by OT    Recommendations for Other Services       Precautions / Restrictions Precautions Precaution Comments: mod fall risk Restrictions Weight Bearing Restrictions: No      Mobility Bed Mobility Overal bed mobility: Modified Independent             General bed mobility comments: increased time, effort    Transfers Overall transfer level: Needs assistance Equipment used: Rolling walker (2 wheels) Transfers: Sit to/from Stand;Bed to chair/wheelchair/BSC Sit to Stand: Supervision Stand pivot transfers: Supervision                Balance Overall balance assessment: Needs assistance Sitting-balance support: Feet supported Sitting balance-Leahy Scale: Good     Standing balance support: No upper extremity supported;During functional activity;Bilateral upper extremity supported Standing balance-Leahy Scale: Good                             ADL either performed or assessed with clinical judgement   ADL Overall ADL's : Needs assistance/impaired;At baseline     Grooming: Wash/dry hands;Wash/dry face;Modified independent;Standing           Upper Body Dressing : Modified independent   Lower Body Dressing: Modified independent       Toileting- Clothing Manipulation and Hygiene: Supervision/safety       Functional mobility during ADLs: Supervision/safety;Rolling walker (2 wheels)       Vision         Perception     Praxis      Pertinent Vitals/Pain Pain Assessment: No/denies pain     Hand Dominance Right  Extremity/Trunk Assessment Upper Extremity Assessment Upper Extremity Assessment: Overall WFL for tasks assessed   Lower Extremity Assessment Lower Extremity Assessment: Overall WFL for tasks assessed   Cervical / Trunk Assessment Cervical / Trunk Assessment: Normal    Communication Communication Communication: No difficulties   Cognition Arousal/Alertness: Awake/alert Behavior During Therapy: WFL for tasks assessed/performed Overall Cognitive Status: Within Functional Limits for tasks assessed                                       General Comments       Exercises Other Exercises Other Exercises: Bed mobility, transfers, UB and LB dressing, grooming, toileting. Educ re: POC, DC option, community supports, health maintanence, medication mgmt.   Shoulder Instructions      Home Living Family/patient expects to be discharged to:: Private residence Living Arrangements: Alone Available Help at Discharge: Personal care attendant;Available PRN/intermittently Type of Home: Mobile home Home Access: Stairs to enter Entrance Stairs-Number of Steps: 2 Entrance Stairs-Rails: Right Home Layout: One level     Bathroom Shower/Tub: Teacher, early years/pre: Standard Bathroom Accessibility: No       Additional Comments: 3-wheel walker      Prior Functioning/Environment Prior Level of Function : Independent/Modified Independent             Mobility Comments: uses 3-wheeled walker. knee pain mildly limiting ambulation since TKA surgery this fall. ADLs Comments: pt performs ADL INDly, not driving since TKA, has hired help for [employer] and shopping        OT Problem List: Decreased strength;Impaired balance (sitting and/or standing);Decreased cognition;Decreased activity tolerance;Decreased coordination;Decreased safety awareness      OT Treatment/Interventions: Self-care/ADL training;DME and/or AE instruction;Therapeutic activities;Balance training;Therapeutic exercise;Energy conservation;Patient/family education    OT Goals(Current goals can be found in the care plan section) Acute Rehab OT Goals Patient Stated Goal: to get BP under control OT Goal Formulation: With patient Time For Goal Achievement:  11/26/21 Potential to Achieve Goals: Good ADL Goals Additional ADL Goal #1: Pt can INDly describe her strategies for monitoring and tracking her BP on a daily basis. Additional ADL Goal #2: Pt can INDly identify 2+ resources (e.g., relatives, friends, neighbors, senior centers, etc) that she can contact for assistance and can provide accurate contact information for these individuals/organizations. Additional ADL Goal #3: Pt can INDly describe her medication mgmt plan, including knowing what medications she takes, strategies for making sure she takes her medications daily as prescribed, and strategies for making sure Rxs are filled and delivered on time.  OT Frequency: Min 2X/week   Barriers to D/C: Decreased caregiver support          Co-evaluation              AM-PAC OT "6 Clicks" Daily Activity     Outcome Measure Help from another person eating meals?: None Help from another person taking care of personal grooming?: None Help from another person toileting, which includes using toliet, bedpan, or urinal?: A Little Help from another person bathing (including washing, rinsing, drying)?: A Little Help from another person to put on and taking off regular upper body clothing?: None Help from another person to put on and taking off regular lower body clothing?: None 6 Click Score: 22   End of Session Equipment Utilized During Treatment: Rolling walker (2 wheels) Nurse Communication: Mobility status  Activity Tolerance: Patient tolerated treatment well Patient left: in  chair;with call bell/phone within reach  OT Visit Diagnosis: Unsteadiness on feet (R26.81);Muscle weakness (generalized) (M62.81)                Time: 8657-8469 OT Time Calculation (min): 45 min Charges:  OT General Charges $OT Visit: 1 Visit OT Evaluation $OT Eval Moderate Complexity: 1 Mod OT Treatments $Self Care/Home Management : 38-52 mins Josiah Lobo, PhD, MS, OTR/L 11/12/21, 4:47 PM

## 2021-11-13 DIAGNOSIS — Z79890 Hormone replacement therapy (postmenopausal): Secondary | ICD-10-CM | POA: Diagnosis not present

## 2021-11-13 DIAGNOSIS — I674 Hypertensive encephalopathy: Secondary | ICD-10-CM | POA: Diagnosis present

## 2021-11-13 DIAGNOSIS — M810 Age-related osteoporosis without current pathological fracture: Secondary | ICD-10-CM | POA: Diagnosis present

## 2021-11-13 DIAGNOSIS — K219 Gastro-esophageal reflux disease without esophagitis: Secondary | ICD-10-CM | POA: Diagnosis present

## 2021-11-13 DIAGNOSIS — Z8249 Family history of ischemic heart disease and other diseases of the circulatory system: Secondary | ICD-10-CM | POA: Diagnosis not present

## 2021-11-13 DIAGNOSIS — N390 Urinary tract infection, site not specified: Secondary | ICD-10-CM | POA: Diagnosis present

## 2021-11-13 DIAGNOSIS — Z7901 Long term (current) use of anticoagulants: Secondary | ICD-10-CM | POA: Diagnosis not present

## 2021-11-13 DIAGNOSIS — E039 Hypothyroidism, unspecified: Secondary | ICD-10-CM | POA: Diagnosis present

## 2021-11-13 DIAGNOSIS — Z885 Allergy status to narcotic agent status: Secondary | ICD-10-CM | POA: Diagnosis not present

## 2021-11-13 DIAGNOSIS — Z79899 Other long term (current) drug therapy: Secondary | ICD-10-CM | POA: Diagnosis not present

## 2021-11-13 DIAGNOSIS — N189 Chronic kidney disease, unspecified: Secondary | ICD-10-CM | POA: Diagnosis present

## 2021-11-13 DIAGNOSIS — Z91048 Other nonmedicinal substance allergy status: Secondary | ICD-10-CM | POA: Diagnosis not present

## 2021-11-13 DIAGNOSIS — Z882 Allergy status to sulfonamides status: Secondary | ICD-10-CM | POA: Diagnosis not present

## 2021-11-13 DIAGNOSIS — I129 Hypertensive chronic kidney disease with stage 1 through stage 4 chronic kidney disease, or unspecified chronic kidney disease: Secondary | ICD-10-CM | POA: Diagnosis present

## 2021-11-13 DIAGNOSIS — I480 Paroxysmal atrial fibrillation: Secondary | ICD-10-CM | POA: Diagnosis present

## 2021-11-13 DIAGNOSIS — I959 Hypotension, unspecified: Secondary | ICD-10-CM | POA: Diagnosis present

## 2021-11-13 DIAGNOSIS — Z880 Allergy status to penicillin: Secondary | ICD-10-CM | POA: Diagnosis not present

## 2021-11-13 DIAGNOSIS — J9611 Chronic respiratory failure with hypoxia: Secondary | ICD-10-CM | POA: Diagnosis present

## 2021-11-13 DIAGNOSIS — Z881 Allergy status to other antibiotic agents status: Secondary | ICD-10-CM | POA: Diagnosis not present

## 2021-11-13 DIAGNOSIS — F0394 Unspecified dementia, unspecified severity, with anxiety: Secondary | ICD-10-CM | POA: Diagnosis present

## 2021-11-13 DIAGNOSIS — Z9981 Dependence on supplemental oxygen: Secondary | ICD-10-CM | POA: Diagnosis not present

## 2021-11-13 DIAGNOSIS — Z96652 Presence of left artificial knee joint: Secondary | ICD-10-CM | POA: Diagnosis present

## 2021-11-13 DIAGNOSIS — Z20822 Contact with and (suspected) exposure to covid-19: Secondary | ICD-10-CM | POA: Diagnosis present

## 2021-11-13 DIAGNOSIS — Z888 Allergy status to other drugs, medicaments and biological substances status: Secondary | ICD-10-CM | POA: Diagnosis not present

## 2021-11-13 DIAGNOSIS — G9340 Encephalopathy, unspecified: Secondary | ICD-10-CM | POA: Diagnosis present

## 2021-11-13 LAB — GLUCOSE, CAPILLARY: Glucose-Capillary: 111 mg/dL — ABNORMAL HIGH (ref 70–99)

## 2021-11-13 NOTE — Progress Notes (Signed)
Patient seen at bedside with MD. Patient remains quite lethargic and unable to stay awake during conversations. Will continue to monitor.
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
## 2021-11-13 NOTE — Evaluation (Signed)
Physical Therapy Evaluation Patient Details Name: Erin Sutton MRN: [id number] DOB: 02-Apr-1941 Today's Date: 11/13/2021  History of Present Illness  Pt is an 80 year old female with a history of reported dementia, paroxysmal atrial fibrillation on Eliquis, anxiety, history of uncontrolled hypertension who presents to the ER via EMS due to confusion and altered mental status. Patient noted to be hypertensive on arrival: initial blood pressure was 194/86, peaking several hours into her ER stay at 220/95. Pt had been admitted at [facility] from 11/04/2021 through 11/08/2021 for uncontrolled hypertension. Pt DCed from [facility] to a SNF on 11/08/2021 but left facility AMA on 12/17.    Clinical Impression  Pt received seated in recliner upon arrival to room.  Pt agreeable to therapy.  Pt room with significant amounts of laundry in chair of room and piled up in bathroom.  Therapist assisted in getting laundry cleaned up and placed in correct spot before assisting pt to the restroom for self-care.  Pt then ambulated around nursing station and back to room where she transferred back to the recliner.  Pt with good technique during ambulation and transfers needing only CGA for safety at this point, could possibly be shifted to supervision.  Pt left with all needs met, and new recliner placed in room due to existing arm being broken.  Nursing notified of mobility status and purewick not being placed back and requesting for female to place.  Pt will benefit from skilled PT intervention to increase independence and safety with basic mobility in preparation for discharge to the venue listed below.            Recommendations for follow up therapy are one component of a multi-disciplinary discharge planning process, led by the attending physician.  Recommendations may be updated based on patient status, additional functional criteria and insurance authorization.  Follow Up Recommendations No PT follow  up    Assistance Recommended at Discharge None  Functional Status Assessment Patient has had a recent decline in their functional status and demonstrates the ability to make significant improvements in function in a reasonable and predictable amount of time.  Equipment Recommendations  None recommended by PT    Recommendations for Other Services       Precautions / Restrictions Precautions Precautions: Fall Restrictions Weight Bearing Restrictions: No      Mobility  Bed Mobility Overal bed mobility: Modified Independent             General bed mobility comments: increased time, effort    Transfers Overall transfer level: Needs assistance Equipment used: Rolling walker (2 wheels) Transfers: Sit to/from Stand Sit to Stand: Supervision                Ambulation/Gait Ambulation/Gait assistance: Min guard Gait Distance (Feet): 200 Feet Assistive device: Rolling walker (2 wheels) Gait Pattern/deviations: Step-through pattern Gait velocity: slightly decreased     General Gait Details: Pt ambulates well with FWW and with good technique.  Stairs            Wheelchair Mobility    Modified Rankin (Stroke Patients Only)       Balance Overall balance assessment: Needs assistance Sitting-balance support: Feet supported Sitting balance-Leahy Scale: Good     Standing balance support: No upper extremity supported;During functional activity;Bilateral upper extremity supported Standing balance-Leahy Scale: Good                               Pertinent  Vitals/Pain Pain Assessment: No/denies pain    Home Living Family/patient expects to be discharged to:: Private residence Living Arrangements: Alone Available Help at Discharge: Personal care attendant;Available PRN/intermittently Type of Home: Mobile home Home Access: Stairs to enter;Ramped entrance Entrance Stairs-Rails: Right Entrance Stairs-Number of Steps: 2 steps, but has ramped  entrance that was just built by church.   Home Layout: One level Home Equipment: Cane - single point;BSC/3in1;Tub bench;Other (comment) Additional Comments: 3-wheel walker    Prior Function Prior Level of Function : Independent/Modified Independent             Mobility Comments: uses 3-wheeled walker inside mobile hoem due to narrow door frames. knee pain mildly limiting ambulation since TKA surgery this fall. ADLs Comments: pt performs ADL INDly, not driving since TKA, has hired help for [employer] and shopping     Hand Dominance   Dominant Hand: Right    Extremity/Trunk Assessment   Upper Extremity Assessment Upper Extremity Assessment: Generalized weakness    Lower Extremity Assessment Lower Extremity Assessment: Generalized weakness    Cervical / Trunk Assessment Cervical / Trunk Assessment: Normal  Communication   Communication: No difficulties  Cognition Arousal/Alertness: Awake/alert Behavior During Therapy: WFL for tasks assessed/performed Overall Cognitive Status: Within Functional Limits for tasks assessed                                          General Comments      Exercises     Assessment/Plan    PT Assessment Patient needs continued PT services  PT Problem List Decreased strength;Decreased activity tolerance;Decreased balance;Decreased mobility;Decreased safety awareness       PT Treatment Interventions DME instruction;Gait training;Functional mobility training;Therapeutic activities;Therapeutic exercise;Balance training    PT Goals (Current goals can be found in the Care Plan section)  Acute Rehab PT Goals Patient Stated Goal: to go home. PT Goal Formulation: With patient Time For Goal Achievement: 11/27/21 Potential to Achieve Goals: Good    Frequency Min 2X/week   Barriers to discharge        Co-evaluation               AM-PAC PT "6 Clicks" Mobility  Outcome Measure Help needed turning from your back  to your side while in a flat bed without using bedrails?: None Help needed moving from lying on your back to sitting on the side of a flat bed without using bedrails?: None Help needed moving to and from a bed to a chair (including a wheelchair)?: None Help needed standing up from a chair using your arms (e.g., wheelchair or bedside chair)?: None Help needed to walk in hospital room?: A Little Help needed climbing 3-5 steps with a railing? : A Little 6 Click Score: 22    End of Session Equipment Utilized During Treatment: Gait belt Activity Tolerance: Patient tolerated treatment well Patient left: in chair;with chair alarm set Nurse Communication: Mobility status PT Visit Diagnosis: Unsteadiness on feet (R26.81);Other abnormalities of gait and mobility (R26.89);Muscle weakness (generalized) (M62.81);History of falling (Z91.81);Difficulty in walking, not elsewhere classified (R26.2)    Time: 6962-9528 PT Time Calculation (min) (ACUTE ONLY): 38 min   Charges:   PT Evaluation $PT Eval Low Complexity: 1 Low PT Treatments $Gait Training: 8-22 mins $Therapeutic Activity: 8-22 mins        [name], PT, DPT 11/13/21, 5:31 PM   [name] 11/13/2021, 5:27 PM

## 2021-11-13 NOTE — Progress Notes (Signed)
PT Cancellation Note  Patient Details Name: Erin Sutton MRN: [id number] DOB: 10/16/1941   Cancelled Treatment:    Reason Eval/Treat Not Completed: Fatigue/lethargy limiting ability to participate;Patient's level of consciousness.  PT order received and reviewed.  Pt unable to respond and awaken upon entering the room.  Nursing notes she is still lethargic due to ativan being given around midnight and has been lethargic ever since.  Nursing advised to come back at later time if possible.  Will re-attempt evaluation when medically appropriate.   [name], PT, DPT 11/13/21, 11:32 AM

## 2021-11-13 NOTE — TOC CM/SW Note (Signed)
Patient lethargic today. Unable to give [place] letter. Patient opened her eyes and nodded when asked if she wanted CSW to come back later. PT unable to work with her due to lethargy.  Dayton Scrape, [place]

## 2021-11-13 NOTE — Progress Notes (Signed)
PROGRESS NOTE    Erin Sutton  VEL:[id number] DOB: [birth year] DOA: 11/11/2021 PCP: Baxter Hire, MD    Brief Narrative:  80 year old with history of dementia, paroxysmal A. fib on Eliquis, anxiety, uncontrolled hypertension who was recently admitted to [facility] and discharged to a skilled nursing facility signed off from SNF on 12/17, went home.  Her skilled nursing facility actually called EMS to go home and do a welfare check on her.  EMS found her confused with blood pressure more than 200 and they brought to ER.   Assessment & Plan:   Principal Problem:   Hypertensive encephalopathy Active Problems:   Hypothyroidism   GERD (gastroesophageal reflux disease)   Anxiety   Dementia (HCC)   Paroxysmal atrial fibrillation (HCC)   Chronic anticoagulation - on eliquis for afib   Pyuria   Encephalopathy  Hypertensive encephalopathy, accelerated hypertension: Patient not taking medications at home.  On multiple antihypertensives.  Reported drop in blood pressure by previous medications. Started on clonidine 0.2 mg/24-hour patch to improve compliance. Metoprolol 25 twice daily. We will keep on as needed medications.  Goal blood pressure less than 160 today.  With history of sudden drop in blood pressure, will keep systolic blood pressure 258-527. Mental status improved initially now sleepy with Ativan.  Hypothyroidism GERD Paroxysmal A. fib on Eliquis Stable medical issues.  Continue home medications.  Physical deconditioning and debility: Need to work with PT OT.  Anticipate referral back to SNF.  Chronic hypoxemic respiratory failure: Uses 2 L oxygen at home.  Mostly at night.  Continue.  Suspected UTI present on admission: On Rocephin.  Continue until urine cultures.  Less likely acute infection.  Discontinue antibiotics.   DVT prophylaxis: SCDs Start: 11/11/21 1534 apixaban (ELIQUIS) tablet 5 mg   Code Status: Full code Family Communication: Daughter on the  phone. Disposition Plan: Status is: Inpatient.  Remains inpatient because of altered mental status.  Lethargic.  Unable to find safe discharge disposition.       Consultants:  None  Procedures:  None  Antimicrobials:  Rocephin 12/19--- 12/21   Subjective: Patient seen and examined.  Sleepy.  Wakes up to stimuli and then falls back to sleep.  Received Ativan at about 1 AM.  Last night she had breakdown with the nurses because of not giving Ativan.   Objective: Vitals:   11/13/21 0524 11/13/21 0748 11/13/21 0951 11/13/21 1126  BP: (!) 152/88 (!) 180/86 (!) 158/80 (!) 151/77  Pulse: (!) 57 83 72 64  Resp: 16 14  16   Temp: 98.4 F (36.9 C) 98 F (36.7 C)  (!) 97.5 F (36.4 C)  TempSrc: Oral Oral  Axillary  SpO2: 100% 95%  100%  Weight:      Height:        Intake/Output Summary (Last 24 hours) at 11/13/2021 1146 Last data filed at 11/13/2021 1129 Gross per 24 hour  Intake 580 ml  Output 1250 ml  Net -670 ml   Filed Weights   11/11/21 1150  Weight: 65.3 kg    Examination:  General: Chronically sick looking.  Debilitated.  Not in distress. Patient is sleepy today.  She is alert to stimulation but goes back to sleep. Cardiovascular: S1-S2 normal. Respiratory: Bilateral clear.  On 2 L oxygen. Gastrointestinal: Soft.  Nontender.  Obese and pendulous. Ext: No cyanosis or edema.  Right knee recent surgical incision clean and dry. Neuro: No focal deficits.  Moves all extremities.  Sleepy.    Data Reviewed: I have  personally reviewed following labs and imaging studies  CBC: Recent Labs  Lab 11/11/21 1156 11/12/21 0625  WBC 6.6 7.2  NEUTROABS 5.0 4.3  HGB 13.7 10.9*  HCT 42.7 33.3*  MCV 89.1 89.3  PLT 281 151   Basic Metabolic Panel: Recent Labs  Lab 11/11/21 1156 11/12/21 0625  NA 138 137  K 3.5 3.5  CL 101 102  CO2 26 26  GLUCOSE 108* 95  BUN 14 15  CREATININE 0.71 0.76  CALCIUM 11.2* 9.8   GFR: Estimated Creatinine Clearance: 47.3 mL/min  (by C-G formula based on SCr of 0.76 mg/dL). Liver Function Tests: Recent Labs  Lab 11/11/21 1156 11/12/21 0625  AST 31 20  ALT 15 11  ALKPHOS 80 56  BILITOT 0.9 0.9  PROT 8.6* 6.6  ALBUMIN 5.0 3.8   No results for input(s): LIPASE, AMYLASE in the last 168 hours. No results for input(s): AMMONIA in the last 168 hours. Coagulation Profile: No results for input(s): INR, PROTIME in the last 168 hours. Cardiac Enzymes: No results for input(s): CKTOTAL, CKMB, CKMBINDEX, TROPONINI in the last 168 hours. BNP (last 3 results) No results for input(s): PROBNP in the last 8760 hours. HbA1C: No results for input(s): HGBA1C in the last 72 hours. CBG: Recent Labs  Lab 11/13/21 1129  GLUCAP 111*   Lipid Profile: No results for input(s): CHOL, HDL, LDLCALC, TRIG, CHOLHDL, LDLDIRECT in the last 72 hours. Thyroid Function Tests: No results for input(s): TSH, T4TOTAL, FREET4, T3FREE, THYROIDAB in the last 72 hours. Anemia Panel: No results for input(s): VITAMINB12, FOLATE, FERRITIN, TIBC, IRON, RETICCTPCT in the last 72 hours. Sepsis Labs: No results for input(s): PROCALCITON, LATICACIDVEN in the last 168 hours.  Recent Results (from the past 240 hour(s))  Resp Panel by RT-PCR (Flu A&B, Covid) Nasopharyngeal Swab     Status: None   Collection Time: 11/11/21 11:56 AM   Specimen: Nasopharyngeal Swab; Nasopharyngeal(NP) swabs in vial transport medium  Result Value Ref Range Status   SARS Coronavirus 2 by RT PCR NEGATIVE NEGATIVE Final    Comment: (NOTE) SARS-CoV-2 target nucleic acids are NOT DETECTED.  The SARS-CoV-2 RNA is generally detectable in upper respiratory specimens during the acute phase of infection. The lowest concentration of SARS-CoV-2 viral copies this assay can detect is 138 copies/mL. A negative result does not preclude SARS-Cov-2 infection and should not be used as the sole basis for treatment or other patient management decisions. A negative result may occur with   improper specimen collection/handling, submission of specimen other than nasopharyngeal swab, presence of viral mutation(s) within the areas targeted by this assay, and inadequate number of viral copies(<138 copies/mL). A negative result must be combined with clinical observations, patient history, and epidemiological information. The expected result is Negative.  Fact Sheet for Patients:  EntrepreneurPulse.com.au  Fact Sheet for Healthcare Providers:  IncredibleEmployment.be  This test is no t yet approved or cleared by the [place] FDA and  has been authorized for detection and/or diagnosis of SARS-CoV-2 by FDA under an Emergency Use Authorization (EUA). This EUA will remain  in effect (meaning this test can be used) for the duration of the COVID-19 declaration under Section 564(b)(1) of the Act, 21 U.S.C.section 360bbb-3(b)(1), unless the authorization is terminated  or revoked sooner.       Influenza A by PCR NEGATIVE NEGATIVE Final   Influenza B by PCR NEGATIVE NEGATIVE Final    Comment: (NOTE) The Xpert Xpress SARS-CoV-2/FLU/RSV plus assay is intended as an aid in the diagnosis  of influenza from Nasopharyngeal swab specimens and should not be used as a sole basis for treatment. Nasal washings and aspirates are unacceptable for Xpert Xpress SARS-CoV-2/FLU/RSV testing.  Fact Sheet for Patients: EntrepreneurPulse.com.au  Fact Sheet for Healthcare Providers: IncredibleEmployment.be  This test is not yet approved or cleared by the [place] FDA and has been authorized for detection and/or diagnosis of SARS-CoV-2 by FDA under an Emergency Use Authorization (EUA). This EUA will remain in effect (meaning this test can be used) for the duration of the COVID-19 declaration under Section 564(b)(1) of the Act, 21 U.S.C. section 360bbb-3(b)(1), unless the authorization is terminated  or revoked.  Performed at [facility], [address], [place], [place] [address]   Urine Culture     Status: None   Collection Time: 11/11/21 11:56 AM   Specimen: Urine, Clean Catch  Result Value Ref Range Status   Specimen Description   Final    URINE, CLEAN CATCH Performed at [facility], [address] [place], [place] [address]    Special Requests   Final    NONE Performed at [facility], [address], [place], [place] [address]    Culture   Final    NO GROWTH Performed at [facility], [place] [address] [place], [place] [address]    Report Status 11/12/2021 FINAL  Final         Radiology Studies: CT Head Wo Contrast  Result Date: 11/11/2021 CLINICAL DATA:  Weakness.  Mental status changes. EXAM: CT HEAD WITHOUT CONTRAST TECHNIQUE: Contiguous axial images were obtained from the base of the skull through the vertex without intravenous contrast. COMPARISON:  08/17/2021 FINDINGS: Brain: No mass lesion, hemorrhage, hydrocephalus, acute infarct, intra-axial, or extra-axial fluid collection. Vascular: No hyperdense vessel or unexpected calcification. Intracranial atherosclerosis. Skull: No significant soft tissue swelling.  No skull fracture. Sinuses/Orbits: Normal imaged portions of the orbits and globes. Clear paranasal sinuses and mastoid air cells. Other: None. IMPRESSION: No acute intracranial abnormality. Electronically Signed   By: Abigail Miyamoto M.D.   On: 11/11/2021 12:55   DG Chest Portable 1 View  Result Date: 11/11/2021 CLINICAL DATA:  Weakness, altered mental status, recent knee surgery EXAM: PORTABLE CHEST 1 VIEW COMPARISON:  Portable exam 1201 hours compared to 08/29/2021 FINDINGS: Normal heart size, mediastinal contours, and pulmonary vascularity. Atherosclerotic calcification aorta. Minimal atelectasis LEFT base. No definite infiltrate, pleural effusion, or pneumothorax. Bones demineralized IMPRESSION: Minimal LEFT basilar  atelectasis. Aortic Atherosclerosis (ICD10-I70.0). Electronically Signed   By: Lavonia Dana M.D.   On: 11/11/2021 12:13        Scheduled Meds:  acetaminophen  1,000 mg Oral TID   apixaban  5 mg Oral BID   atorvastatin  40 mg Oral Daily   cloNIDine  0.2 mg Transdermal Weekly   levothyroxine  50 mcg Oral QAC breakfast   metoprolol tartrate  25 mg Oral BID   pantoprazole  40 mg Oral Daily   Continuous Infusions:     LOS: 0 days    Time spent: 25 minutes    Barb Merino, MD [facility] Pager [phone]

## 2021-11-14 MED ORDER — LORAZEPAM 0.5 MG PO TABS: 0.5000 mg | ORAL_TABLET | Freq: Every evening | ORAL | Status: DC | PRN

## 2021-11-14 MED ORDER — METOPROLOL TARTRATE 25 MG PO TABS: 25.0000 mg | ORAL_TABLET | Freq: Two times a day (BID) | ORAL | 2 refills | Status: DC

## 2021-11-14 MED ORDER — APIXABAN 5 MG PO TABS: 5.0000 mg | ORAL_TABLET | Freq: Two times a day (BID) | ORAL | 0 refills | Status: DC

## 2021-11-14 MED ORDER — SPIRONOLACTONE 25 MG PO TABS: 25.0000 mg | ORAL_TABLET | Freq: Every day | ORAL | 2 refills | Status: DC

## 2021-11-14 NOTE — Discharge Summary (Signed)
Physician Discharge Summary  Erin Sutton WLN:[id number] DOB: 03-Jul-1941 DOA: 11/11/2021  PCP: Baxter Hire, MD  Admit date: 11/11/2021 Discharge date: 11/14/2021  Admitted From: Home Disposition: Home  Recommendations for Outpatient Follow-up:  Follow up with PCP in 1-2 weeks Record your blood pressure at home and bring it to doctor's office on follow-up.  Home Health: N/A Equipment/Devices: N/A  Discharge Condition: Stable CODE STATUS: Full code Diet recommendation: Low-salt diet  Discharge summary: 80 year old with history of paroxysmal A. fib on Eliquis, anxiety, uncontrolled hypertension who was recently admitted to [facility] and discharged to a skilled nursing facility signed off from SNF on 12/17, went home.  Her skilled nursing facility actually called EMS to go home and do a welfare check on her.  EMS found her confused with blood pressure more than 200 and they brought to ER.  Patient also on benzodiazepines and opiates and suspect taken medication in combination.  Acute metabolic encephalopathy, suspected hypertensive encephalopathy with accelerated hypertension. Probably missing home medications.  Recent extensive investigation at [facility] and suspected hyperaldosterone.  Results are pending. Started on clonidine patch, blood pressures started dropping less than 100.  Discontinued. Patient has labile blood pressure, in order to avoid hypotension, will accept slight high blood pressures. Current discharge regimen will be metoprolol 25 mg twice daily and Aldactone 25 mg daily.  She will keep a logbook of blood pressures at home and bring it to Dr. Gabriel Carina on follow-up. Mental status has normalized.  She has ambulated around and is independent and safe to be discharged home.       Hypothyroidism GERD Paroxysmal A. fib on Eliquis Chronic hypoxemic respiratory failure on 2 L oxygen, continue.  Stable. Stable medical issues.  Continue home medications.  Patient with improved  mentation and mobility today.  She is going to her daughter's house for supervised living. Advised to not to use Ativan and oxycodone in combination to avoid polypharmacy.  Patient is aware.       Discharge Diagnoses:  Principal Problem:   Hypertensive encephalopathy Active Problems:   Hypothyroidism   GERD (gastroesophageal reflux disease)   Anxiety   Dementia (HCC)   Paroxysmal atrial fibrillation (HCC)   Chronic anticoagulation - on eliquis for afib   Pyuria   Encephalopathy    Discharge Instructions  Discharge Instructions     Diet - low sodium heart healthy   Complete by: As directed    Discharge instructions   Complete by: As directed    Do not combine multiple medications together that can make you excessively sleepy. Check your blood pressures at home and keep a log book. Bring it to doctors office visit   Increase activity slowly   Complete by: As directed       Allergies as of 11/14/2021       Reactions   Ace Inhibitors Cough   Levofloxacin    Penicillin G Other (See Comments)   Tape Other (See Comments)   Other reaction(s): Other (See Comments)   Amoxicillin Rash   Has patient had a PCN reaction causing immediate rash, facial/tongue/throat swelling, SOB or lightheadedness with hypotension: No Has patient had a PCN reaction causing severe rash involving mucus membranes or skin necrosis: No Has patient had a PCN reaction that required hospitalization: No Has patient had a PCN reaction occurring within the last 10 years: No If all of the above answers are "NO", then may proceed with Cephalosporin use.   Benicar [olmesartan] Rash   Itching rash  Codeine Rash   Codeine Sulfate Rash   Levaquin [levofloxacin In D5w] Rash   Alters mental status   Penicillin V Potassium Rash   Has patient had a PCN reaction causing immediate rash, facial/tongue/throat swelling, SOB or lightheadedness with hypotension: No Has patient had a PCN reaction causing severe rash  involving mucus membranes or skin necrosis: No Has patient had a PCN reaction that required hospitalization: No Has patient had a PCN reaction occurring within the last 10 years: No If all of the above answers are "NO", then may proceed with Cephalosporin use.   Sulfa Antibiotics Rash        Medication List     STOP taking these medications    potassium chloride SA 20 MEQ tablet Commonly known as: KLOR-CON M   promethazine 12.5 MG tablet Commonly known as: PHENERGAN       TAKE these medications    acetaminophen 500 MG tablet Commonly known as: TYLENOL Take 2 tablets (1,000 mg total) by mouth 3 (three) times daily.   apixaban 5 MG Tabs tablet Commonly known as: ELIQUIS Take 1 tablet (5 mg total) by mouth 2 (two) times daily.   atorvastatin 40 MG tablet Commonly known as: LIPITOR Take 40 mg by mouth daily.   cyanocobalamin 1000 MCG/ML injection Commonly known as: (VITAMIN B-12) Inject 1,000 mcg into the muscle See admin instructions. Inject 10107mcg once weekly for 4 weeks then recheck by Dr Wynetta Emery   diclofenac Sodium 1 % Gel Commonly known as: VOLTAREN Apply 1 application topically daily.   levothyroxine 50 MCG tablet Commonly known as: SYNTHROID Take 50 mcg by mouth daily before breakfast.   LORazepam 0.5 MG tablet Commonly known as: ATIVAN Take 0.5 mg by mouth every 8 (eight) hours as needed for anxiety or sleep.   melatonin 3 MG Tabs tablet Take 1 tablet by mouth at bedtime as needed.   metoprolol tartrate 25 MG tablet Commonly known as: LOPRESSOR Take 1 tablet (25 mg total) by mouth in the morning and at bedtime.   neomycin-polymyxin b-dexamethasone 3.5-10000-0.1 Oint Commonly known as: MAXITROL 1 application in the morning and at bedtime. (Affected eye/eyes)   oxyCODONE 5 MG immediate release tablet Commonly known as: Oxy IR/ROXICODONE Take 5 mg by mouth every 6 (six) hours as needed for severe pain.   pantoprazole 40 MG tablet Commonly known  as: PROTONIX Take 40 mg by mouth daily.   polyethylene glycol powder 17 GM/SCOOP powder Commonly known as: GLYCOLAX/MIRALAX Take 17 g by mouth daily.   senna-docusate 8.6-50 MG tablet Commonly known as: Senokot-S Take 2 tablets by mouth 2 (two) times daily as needed for mild constipation.   spironolactone 25 MG tablet Commonly known as: ALDACTONE Take 1 tablet (25 mg total) by mouth daily.   SYSTANE OP Place 1 drop into both eyes 2 (two) times daily.   thiamine 100 MG tablet Take 1 tablet by mouth daily.   Zinc Oxide 40 % Pste Apply 1 application topically 2 (two) times daily as needed.        Allergies  Allergen Reactions   Ace Inhibitors Cough   Levofloxacin    Penicillin G Other (See Comments)   Tape Other (See Comments)    Other reaction(s): Other (See Comments)   Amoxicillin Rash    Has patient had a PCN reaction causing immediate rash, facial/tongue/throat swelling, SOB or lightheadedness with hypotension: No Has patient had a PCN reaction causing severe rash involving mucus membranes or skin necrosis: No Has patient had a  PCN reaction that required hospitalization: No Has patient had a PCN reaction occurring within the last 10 years: No If all of the above answers are "NO", then may proceed with Cephalosporin use.    Benicar [Olmesartan] Rash    Itching rash   Codeine Rash   Codeine Sulfate Rash   Levaquin [Levofloxacin In D5w] Rash    Alters mental status   Penicillin V Potassium Rash    Has patient had a PCN reaction causing immediate rash, facial/tongue/throat swelling, SOB or lightheadedness with hypotension: No Has patient had a PCN reaction causing severe rash involving mucus membranes or skin necrosis: No Has patient had a PCN reaction that required hospitalization: No Has patient had a PCN reaction occurring within the last 10 years: No If all of the above answers are "NO", then may proceed with Cephalosporin use.    Sulfa Antibiotics Rash     Consultations: None   Procedures/Studies: CT Head Wo Contrast  Result Date: 11/11/2021 CLINICAL DATA:  Weakness.  Mental status changes. EXAM: CT HEAD WITHOUT CONTRAST TECHNIQUE: Contiguous axial images were obtained from the base of the skull through the vertex without intravenous contrast. COMPARISON:  08/17/2021 FINDINGS: Brain: No mass lesion, hemorrhage, hydrocephalus, acute infarct, intra-axial, or extra-axial fluid collection. Vascular: No hyperdense vessel or unexpected calcification. Intracranial atherosclerosis. Skull: No significant soft tissue swelling.  No skull fracture. Sinuses/Orbits: Normal imaged portions of the orbits and globes. Clear paranasal sinuses and mastoid air cells. Other: None. IMPRESSION: No acute intracranial abnormality. Electronically Signed   By: Abigail Miyamoto M.D.   On: 11/11/2021 12:55   DG Chest Portable 1 View  Result Date: 11/11/2021 CLINICAL DATA:  Weakness, altered mental status, recent knee surgery EXAM: PORTABLE CHEST 1 VIEW COMPARISON:  Portable exam 1201 hours compared to 08/29/2021 FINDINGS: Normal heart size, mediastinal contours, and pulmonary vascularity. Atherosclerotic calcification aorta. Minimal atelectasis LEFT base. No definite infiltrate, pleural effusion, or pneumothorax. Bones demineralized IMPRESSION: Minimal LEFT basilar atelectasis. Aortic Atherosclerosis (ICD10-I70.0). Electronically Signed   By: Lavonia Dana M.D.   On: 11/11/2021 12:13   (Echo, Carotid, EGD, Colonoscopy, ERCP)    Subjective: Patient seen and examined.  Overnight she was not given Ativan and was upset.  Today morning, she is cheerful, she tells me her story and she is looking forward to go to her daughter's house in [place].  Denies any pain, nausea or vomiting.  She was able to walk around in the hallway with physical therapist yesterday evening.   Discharge Exam: Vitals:   11/14/21 0307 11/14/21 0820  BP: 105/60 (!) 110/54  Pulse: 63 70  Resp: 20 18   Temp: 98.4 F (36.9 C) 98.2 F (36.8 C)  SpO2: 95% 98%   Vitals:   11/13/21 2015 11/13/21 2306 11/14/21 0307 11/14/21 0820  BP: 113/80 (!) 96/54 105/60 (!) 110/54  Pulse: (!) 59 (!) 55 63 70  Resp: 16 18 20 18   Temp: 98.9 F (37.2 C) 97.6 F (36.4 C) 98.4 F (36.9 C) 98.2 F (36.8 C)  TempSrc: Oral Oral Oral Oral  SpO2: 98% 100% 95% 98%  Weight:      Height:        General: Pt is alert, awake, not in acute distress Looks comfortable.  Talkative.  Alert oriented x4. Cardiovascular: RRR, S1/S2 +, no rubs, no gallops, on room air at rest. Respiratory: CTA bilaterally, no wheezing, no rhonchi Abdominal: Soft, NT, ND, bowel sounds + Extremities: no edema, no cyanosis Patient has slight induration on the  right knee incision line from recent knee replacement, no evidence of infection or underlying effusion.    The results of significant diagnostics from this hospitalization (including imaging, microbiology, ancillary and laboratory) are listed below for reference.     Microbiology: Recent Results (from the past 240 hour(s))  Resp Panel by RT-PCR (Flu A&B, Covid) Nasopharyngeal Swab     Status: None   Collection Time: 11/11/21 11:56 AM   Specimen: Nasopharyngeal Swab; Nasopharyngeal(NP) swabs in vial transport medium  Result Value Ref Range Status   SARS Coronavirus 2 by RT PCR NEGATIVE NEGATIVE Final    Comment: (NOTE) SARS-CoV-2 target nucleic acids are NOT DETECTED.  The SARS-CoV-2 RNA is generally detectable in upper respiratory specimens during the acute phase of infection. The lowest concentration of SARS-CoV-2 viral copies this assay can detect is 138 copies/mL. A negative result does not preclude SARS-Cov-2 infection and should not be used as the sole basis for treatment or other patient management decisions. A negative result may occur with  improper specimen collection/handling, submission of specimen other than nasopharyngeal swab, presence of viral mutation(s)  within the areas targeted by this assay, and inadequate number of viral copies(<138 copies/mL). A negative result must be combined with clinical observations, patient history, and epidemiological information. The expected result is Negative.  Fact Sheet for Patients:  EntrepreneurPulse.com.au  Fact Sheet for Healthcare Providers:  IncredibleEmployment.be  This test is no t yet approved or cleared by the [place] FDA and  has been authorized for detection and/or diagnosis of SARS-CoV-2 by FDA under an Emergency Use Authorization (EUA). This EUA will remain  in effect (meaning this test can be used) for the duration of the COVID-19 declaration under Section 564(b)(1) of the Act, 21 U.S.C.section 360bbb-3(b)(1), unless the authorization is terminated  or revoked sooner.       Influenza A by PCR NEGATIVE NEGATIVE Final   Influenza B by PCR NEGATIVE NEGATIVE Final    Comment: (NOTE) The Xpert Xpress SARS-CoV-2/FLU/RSV plus assay is intended as an aid in the diagnosis of influenza from Nasopharyngeal swab specimens and should not be used as a sole basis for treatment. Nasal washings and aspirates are unacceptable for Xpert Xpress SARS-CoV-2/FLU/RSV testing.  Fact Sheet for Patients: EntrepreneurPulse.com.au  Fact Sheet for Healthcare Providers: IncredibleEmployment.be  This test is not yet approved or cleared by the [place] FDA and has been authorized for detection and/or diagnosis of SARS-CoV-2 by FDA under an Emergency Use Authorization (EUA). This EUA will remain in effect (meaning this test can be used) for the duration of the COVID-19 declaration under Section 564(b)(1) of the Act, 21 U.S.C. section 360bbb-3(b)(1), unless the authorization is terminated or revoked.  Performed at [facility], [address] [place], [place] [address]   Urine Culture     Status: None   Collection  Time: 11/11/21 11:56 AM   Specimen: Urine, Clean Catch  Result Value Ref Range Status   Specimen Description   Final    URINE, CLEAN CATCH Performed at [facility], [address], [place], [place] [address]    Special Requests   Final    NONE Performed at [facility], [address] [place], [place] [address]    Culture   Final    NO GROWTH Performed at [facility], [place] [address], [place],  [address]    Report Status 11/12/2021 FINAL  Final     Labs: BNP (last 3 results) Recent Labs    06/29/21  1003  BNP 53.6   Basic Metabolic Panel: Recent Labs  Lab 11/11/21 1156 11/12/21 0625  NA 138 137  K 3.5 3.5  CL 101 102  CO2 26 26  GLUCOSE 108* 95  BUN 14 15  CREATININE 0.71 0.76  CALCIUM 11.2* 9.8   Liver Function Tests: Recent Labs  Lab 11/11/21 1156 11/12/21 0625  AST 31 20  ALT 15 11  ALKPHOS 80 56  BILITOT 0.9 0.9  PROT 8.6* 6.6  ALBUMIN 5.0 3.8   No results for input(s): LIPASE, AMYLASE in the last 168 hours. No results for input(s): AMMONIA in the last 168 hours. CBC: Recent Labs  Lab 11/11/21 1156 11/12/21 0625  WBC 6.6 7.2  NEUTROABS 5.0 4.3  HGB 13.7 10.9*  HCT 42.7 33.3*  MCV 89.1 89.3  PLT 281 256   Cardiac Enzymes: No results for input(s): CKTOTAL, CKMB, CKMBINDEX, TROPONINI in the last 168 hours. BNP: Invalid input(s): POCBNP CBG: Recent Labs  Lab 11/13/21 1129  GLUCAP 111*   D-Dimer No results for input(s): DDIMER in the last 72 hours. Hgb A1c No results for input(s): HGBA1C in the last 72 hours. Lipid Profile No results for input(s): CHOL, HDL, LDLCALC, TRIG, CHOLHDL, LDLDIRECT in the last 72 hours. Thyroid function studies No results for input(s): TSH, T4TOTAL, T3FREE, THYROIDAB in the last 72 hours.  Invalid input(s): FREET3 Anemia work up No results for input(s): VITAMINB12, FOLATE, FERRITIN, TIBC, IRON, RETICCTPCT in the last 72 hours. Urinalysis    Component Value Date/Time    COLORURINE STRAW (A) 11/11/2021 1156   APPEARANCEUR CLEAR (A) 11/11/2021 1156   APPEARANCEUR Hazy 07/25/2014 1350   LABSPEC 1.006 11/11/2021 1156   LABSPEC 1.020 07/25/2014 1350   PHURINE 7.0 11/11/2021 [place] 11/11/2021 1156   GLUCOSEU Negative 07/25/2014 1350   HGBUR SMALL (A) 11/11/2021 1156   BILIRUBINUR NEGATIVE 11/11/2021 1156   BILIRUBINUR Negative 07/25/2014 1350   KETONESUR 5 (A) 11/11/2021 1156   PROTEINUR 30 (A) 11/11/2021 1156   NITRITE NEGATIVE 11/11/2021 1156   LEUKOCYTESUR MODERATE (A) 11/11/2021 1156   LEUKOCYTESUR 2+ 07/25/2014 1350   Sepsis Labs Invalid input(s): PROCALCITONIN,  WBC,  LACTICIDVEN Microbiology Recent Results (from the past 240 hour(s))  Resp Panel by RT-PCR (Flu A&B, Covid) Nasopharyngeal Swab     Status: None   Collection Time: 11/11/21 11:56 AM   Specimen: Nasopharyngeal Swab; Nasopharyngeal(NP) swabs in vial transport medium  Result Value Ref Range Status   SARS Coronavirus 2 by RT PCR NEGATIVE NEGATIVE Final    Comment: (NOTE) SARS-CoV-2 target nucleic acids are NOT DETECTED.  The SARS-CoV-2 RNA is generally detectable in upper respiratory specimens during the acute phase of infection. The lowest concentration of SARS-CoV-2 viral copies this assay can detect is 138 copies/mL. A negative result does not preclude SARS-Cov-2 infection and should not be used as the sole basis for treatment or other patient management decisions. A negative result may occur with  improper specimen collection/handling, submission of specimen other than nasopharyngeal swab, presence of viral mutation(s) within the areas targeted by this assay, and inadequate number of viral copies(<138 copies/mL). A negative result must be combined with clinical observations, patient history, and epidemiological information. The expected result is Negative.  Fact Sheet for Patients:  EntrepreneurPulse.com.au  Fact Sheet for Healthcare  Providers:  IncredibleEmployment.be  This test is no t yet approved or cleared by the [place] FDA and  has been authorized for detection and/or diagnosis of SARS-CoV-2 by FDA under an  Emergency Use Authorization (EUA). This EUA will remain  in effect (meaning this test can be used) for the duration of the COVID-19 declaration under Section 564(b)(1) of the Act, 21 U.S.C.section 360bbb-3(b)(1), unless the authorization is terminated  or revoked sooner.       Influenza A by PCR NEGATIVE NEGATIVE Final   Influenza B by PCR NEGATIVE NEGATIVE Final    Comment: (NOTE) The Xpert Xpress SARS-CoV-2/FLU/RSV plus assay is intended as an aid in the diagnosis of influenza from Nasopharyngeal swab specimens and should not be used as a sole basis for treatment. Nasal washings and aspirates are unacceptable for Xpert Xpress SARS-CoV-2/FLU/RSV testing.  Fact Sheet for Patients: EntrepreneurPulse.com.au  Fact Sheet for Healthcare Providers: IncredibleEmployment.be  This test is not yet approved or cleared by the [place] FDA and has been authorized for detection and/or diagnosis of SARS-CoV-2 by FDA under an Emergency Use Authorization (EUA). This EUA will remain in effect (meaning this test can be used) for the duration of the COVID-19 declaration under Section 564(b)(1) of the Act, 21 U.S.C. section 360bbb-3(b)(1), unless the authorization is terminated or revoked.  Performed at [facility], [address], [place], [place] [address]   Urine Culture     Status: None   Collection Time: 11/11/21 11:56 AM   Specimen: Urine, Clean Catch  Result Value Ref Range Status   Specimen Description   Final    URINE, CLEAN CATCH Performed at [facility], [address] [place], [place] [address]    Special Requests   Final    NONE Performed at [facility], [address], [place], [place] [address]     Culture   Final    NO GROWTH Performed at [facility], [place] [address] [place], [place] [address]    Report Status 11/12/2021 FINAL  Final     Time coordinating discharge: 35 minutes  SIGNED:   Barb Merino, MD  [facility] 11/14/2021, 10:18 AM

## 2021-11-14 NOTE — TOC Progression Note (Signed)
Transition of Care [facility]) - Progression Note    Patient Details  Name: Erin Sutton MRN: [id number] Date of Birth: Jan 27, 1941  Transition of [facility]) CM/SW [place], RN Phone Number: 11/14/2021, 12:32 PM  Clinical Narrative:   Patient lives alone.  She pays for personal care assistant to assist her with grocery shopping.  She states she is not able to drive and she is discharging to daughter's home in [place] for a week.    Patient states she has neighbors and a church family that assist her with rides to appointments if needed.  Patient states she and daugther will discuss going to Assisted Living when she sees her this weekend.  Discussed at length the importance of having support system and communicating when she has needs.  Patient does not want to move to [place] with daughter, but will speak to her this weekend.     Expected Discharge Plan: Home/Self Care Barriers to Discharge: Barriers Resolved  Expected Discharge Plan and Services Expected Discharge Plan: Home/Self Care     Post Acute Care Choice: NA Living arrangements for the past 2 months: Single Family Home Expected Discharge Date: 11/14/21                                     Social Determinants of Health (SDOH) Interventions    Readmission Risk Interventions No flowsheet data found.

## 2021-11-14 NOTE — Progress Notes (Signed)
Physical Therapy Treatment Patient Details Name: Erin Sutton MRN: [id number] DOB: 1941-09-08 Today's Date: 11/14/2021   History of Present Illness Pt is an 80 year old female with a history of reported dementia, paroxysmal atrial fibrillation on Eliquis, anxiety, history of uncontrolled hypertension who presents to the ER via EMS due to confusion and altered mental status. Patient noted to be hypertensive on arrival: initial blood pressure was 194/86, peaking several hours into her ER stay at 220/95. Pt had been admitted at [facility] from 11/04/2021 through 11/08/2021 for uncontrolled hypertension. Pt [facility]ed from [facility] to a SNF on 11/08/2021 but left facility AMA on 12/17.     PT Comments    Pt received in Semi-Fowler's position and agreeable to therapy.  Pt asked about visiting family member at last visit, and due to condition of pt, she elected to not visit today.  Pt became tearful during treatment session, however stated that it would do her mind some good to get up and walk.  Pt able to ambulate around the nursing station and back to pod with supervision.  Pt did perform some ambulation in room without AD and is much more unsteady.  Pt will need to continue ambulating with walker at this point in time.  All needs met and pt thankful for therapy services.  Current discharge plans to home with no PT follow-up remain appropriate at this time.  Pt will continue to benefit from skilled therapy in order to address deficits listed below.     Recommendations for follow up therapy are one component of a multi-disciplinary discharge planning process, led by the attending physician.  Recommendations may be updated based on patient status, additional functional criteria and insurance authorization.  Follow Up Recommendations  No PT follow up     Assistance Recommended at Discharge None  Equipment Recommendations  None recommended by PT    Recommendations for Other Services        Precautions / Restrictions Precautions Precautions: Fall Restrictions Weight Bearing Restrictions: No     Mobility  Bed Mobility Overal bed mobility: Modified Independent             General bed mobility comments: increased time, effort    Transfers Overall transfer level: Needs assistance Equipment used: Rolling walker (2 wheels) Transfers: Sit to/from Stand Sit to Stand: Supervision                Ambulation/Gait Ambulation/Gait assistance: Min guard Gait Distance (Feet): 200 Feet Assistive device: Rolling walker (2 wheels) Gait Pattern/deviations: Step-through pattern Gait velocity: slightly decreased     General Gait Details: Pt ambulates well with FWW and with good technique.   Stairs             Wheelchair Mobility    Modified Rankin (Stroke Patients Only)       Balance Overall balance assessment: Needs assistance Sitting-balance support: Feet supported Sitting balance-Leahy Scale: Good     Standing balance support: No upper extremity supported;During functional activity;Bilateral upper extremity supported Standing balance-Leahy Scale: Good                              Cognition Arousal/Alertness: Awake/alert Behavior During Therapy: WFL for tasks assessed/performed Overall Cognitive Status: Within Functional Limits for tasks assessed  Exercises      General Comments        Pertinent Vitals/Pain      Home Living                          Prior Function            PT Goals (current goals can now be found in the care plan section) Acute Rehab PT Goals Patient Stated Goal: to go home. PT Goal Formulation: With patient Time For Goal Achievement: 11/27/21 Potential to Achieve Goals: Good Progress towards PT goals: Progressing toward goals    Frequency    Min 2X/week      PT Plan      Co-evaluation              AM-PAC PT  "6 Clicks" Mobility   Outcome Measure  Help needed turning from your back to your side while in a flat bed without using bedrails?: None Help needed moving from lying on your back to sitting on the side of a flat bed without using bedrails?: None Help needed moving to and from a bed to a chair (including a wheelchair)?: None Help needed standing up from a chair using your arms (e.g., wheelchair or bedside chair)?: None Help needed to walk in hospital room?: A Little Help needed climbing 3-5 steps with a railing? : A Little 6 Click Score: 22    End of Session Equipment Utilized During Treatment: Gait belt Activity Tolerance: Patient tolerated treatment well Patient left: in chair;with chair alarm set Nurse Communication: Mobility status PT Visit Diagnosis: Unsteadiness on feet (R26.81);Other abnormalities of gait and mobility (R26.89);Muscle weakness (generalized) (M62.81);History of falling (Z91.81);Difficulty in walking, not elsewhere classified (R26.2)     Time: 5537-4827 PT Time Calculation (min) (ACUTE ONLY): 14 min  Charges:  $Gait Training: 8-22 mins                     [name], PT, DPT 11/14/21, 3:46 PM    [name] 11/14/2021, 3:35 PM

## 2021-11-14 NOTE — Progress Notes (Signed)
Pt has not sleep this shift thus far, complaints of R knee pain as well-PRN tylenol given per orders

## 2021-11-14 NOTE — Progress Notes (Addendum)
Pt did not sleep all night, pt now finally asleep at this time

## 2021-11-14 NOTE — Progress Notes (Signed)
Pt continues to have difficulty falling asleep, no sleep this shift thus far

## 2021-11-14 NOTE — Progress Notes (Signed)
NP Erin Sutton made aware that pt states " I cant take it anymore I need my ativan so that I can rest", and that pt hasn't slept all night, per Erin Kern NP pt will need to speak with attending MD regarding ativan order, pt made aware to speak with rounding MD

## 2021-11-19 LAB — BLOOD GAS, VENOUS
Acid-Base Excess: 4.7 mmol/L — ABNORMAL HIGH (ref 0.0–2.0)
Bicarbonate: 28.2 mmol/L — ABNORMAL HIGH (ref 20.0–28.0)
O2 Saturation: 89.8 %
Patient temperature: 37
pCO2, Ven: 37 mmHg — ABNORMAL LOW (ref 44.0–60.0)
pH, Ven: 7.49 — ABNORMAL HIGH (ref 7.250–7.430)
pO2, Ven: 53 mmHg — ABNORMAL HIGH (ref 32.0–45.0)

## 2021-11-20 ENCOUNTER — Emergency Department: Payer: Medicare HMO

## 2021-11-20 ENCOUNTER — Other Ambulatory Visit: Payer: Self-pay

## 2021-11-20 ENCOUNTER — Emergency Department
Admission: EM | Admit: 2021-11-20 | Discharge: 2021-11-20 | Disposition: A | Discharge status: Home / Self Care | Payer: Medicare HMO | Attending: Student in an Organized Health Care Education/Training Program | Admitting: Student in an Organized Health Care Education/Training Program

## 2021-11-20 DIAGNOSIS — N189 Chronic kidney disease, unspecified: Secondary | ICD-10-CM | POA: Diagnosis not present

## 2021-11-20 DIAGNOSIS — E039 Hypothyroidism, unspecified: Secondary | ICD-10-CM | POA: Insufficient documentation

## 2021-11-20 DIAGNOSIS — F0390 Unspecified dementia without behavioral disturbance: Secondary | ICD-10-CM | POA: Diagnosis not present

## 2021-11-20 DIAGNOSIS — I10 Essential (primary) hypertension: Secondary | ICD-10-CM | POA: Diagnosis not present

## 2021-11-20 DIAGNOSIS — I129 Hypertensive chronic kidney disease with stage 1 through stage 4 chronic kidney disease, or unspecified chronic kidney disease: Secondary | ICD-10-CM | POA: Diagnosis not present

## 2021-11-20 DIAGNOSIS — R4182 Altered mental status, unspecified: Secondary | ICD-10-CM | POA: Diagnosis not present

## 2021-11-20 DIAGNOSIS — Z79899 Other long term (current) drug therapy: Secondary | ICD-10-CM | POA: Insufficient documentation

## 2021-11-20 DIAGNOSIS — Z7901 Long term (current) use of anticoagulants: Secondary | ICD-10-CM | POA: Insufficient documentation

## 2021-11-20 LAB — CBC
HCT: 38.7 % (ref 36.0–46.0)
Hemoglobin: 12.4 g/dL (ref 12.0–15.0)
MCH: 28.8 pg (ref 26.0–34.0)
MCHC: 32 g/dL (ref 30.0–36.0)
MCV: 89.8 fL (ref 80.0–100.0)
Platelets: 314 10*3/uL (ref 150–400)
RBC: 4.31 MIL/uL (ref 3.87–5.11)
RDW: 16.2 % — ABNORMAL HIGH (ref 11.5–15.5)
WBC: 7.3 10*3/uL (ref 4.0–10.5)
nRBC: 0 % (ref 0.0–0.2)

## 2021-11-20 LAB — COMPREHENSIVE METABOLIC PANEL
ALT: 13 U/L (ref 0–44)
AST: 23 U/L (ref 15–41)
Albumin: 3.7 g/dL (ref 3.5–5.0)
Alkaline Phosphatase: 59 U/L (ref 38–126)
Anion gap: 9 (ref 5–15)
BUN: 10 mg/dL (ref 8–23)
CO2: 26 mmol/L (ref 22–32)
Calcium: 9.6 mg/dL (ref 8.9–10.3)
Chloride: 103 mmol/L (ref 98–111)
Creatinine, Ser: 0.66 mg/dL (ref 0.44–1.00)
GFR, Estimated: >60 mL/min (ref >60)
Glucose, Bld: 97 mg/dL (ref 70–99)
Potassium: 3.4 mmol/L — ABNORMAL LOW (ref 3.5–5.1)
Sodium: 138 mmol/L (ref 135–145)
Total Bilirubin: 0.9 mg/dL (ref 0.3–1.2)
Total Protein: 6.6 g/dL (ref 6.5–8.1)

## 2021-11-20 LAB — TROPONIN I (HIGH SENSITIVITY)
Troponin I (High Sensitivity): 8 ng/L (ref <18)
Troponin I (High Sensitivity): 10 ng/L (ref <18)

## 2021-11-20 MED ORDER — LORAZEPAM 0.5 MG PO TABS
0.5000 mg | ORAL_TABLET | Freq: Once | ORAL | Status: AC
  Administered 2021-11-20: 16:00:00 0.5 mg via ORAL
  Filled 2021-11-20: qty 1

## 2021-11-20 MED ORDER — ACETAMINOPHEN 325 MG PO TABS
650.0000 mg | ORAL_TABLET | Freq: Once | ORAL | Status: AC
  Administered 2021-11-20: 16:00:00 650 mg via ORAL
  Filled 2021-11-20: qty 2

## 2021-11-20 MED ORDER — AMLODIPINE BESYLATE 2.5 MG PO TABS: 2.5000 mg | ORAL_TABLET | Freq: Every day | ORAL | 1 refills | Status: DC | PRN

## 2021-11-20 MED ORDER — AMLODIPINE BESYLATE 5 MG PO TABS
5.0000 mg | ORAL_TABLET | Freq: Once | ORAL | Status: AC
  Administered 2021-11-20: 16:00:00 5 mg via ORAL
  Filled 2021-11-20: qty 1
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
## 2021-11-20 NOTE — ED Triage Notes (Signed)
[facility] states pt c/o hypertension that has been going on all morning. Has been taking her meds as prescribed.

## 2021-11-20 NOTE — Discharge Instructions (Signed)
Your blood work and imaging is reassuring.  Her blood pressure responded well to amlodipine.  I will prescribe low-dose of amlodipine for you to use as needed if your blood pressure is greater than 180.  If you are having chest pain weakness confusion headache or numbness or tingling please return to the ER.  Please keep your appoint with your PCP.  Please return for any additional questions or concerns.

## 2021-11-20 NOTE — ED Notes (Signed)
Pt up to walk with walker (baseline) and 1 assist, pt c/o dizziness and very unsteady. BP 205/84 after walking. MD notified.

## 2021-11-20 NOTE — ED Notes (Signed)
Spoke with patient's pastor, he is coming to get her.  Pt upset that she had to call for a ride from the ED.  Pt's daughter called, pt informed. Pt will call daughter when she gets home.  Pt in NAD, ambulatory to wheelchair. Pt given blankets to use while waiting/on ride home d/t low temp.  Pt given discharge instructions and all questions answered.

## 2021-11-21 ENCOUNTER — Emergency Department: Payer: Medicare HMO

## 2021-11-21 ENCOUNTER — Other Ambulatory Visit: Payer: Self-pay

## 2021-11-21 ENCOUNTER — Emergency Department
Admission: EM | Admit: 2021-11-21 | Discharge: 2021-11-23 | Disposition: A | Discharge status: Home / Self Care | Payer: Medicare HMO | Attending: Emergency Medicine | Admitting: Emergency Medicine

## 2021-11-21 DIAGNOSIS — R29810 Facial weakness: Secondary | ICD-10-CM | POA: Diagnosis not present

## 2021-11-21 DIAGNOSIS — R0789 Other chest pain: Secondary | ICD-10-CM | POA: Diagnosis not present

## 2021-11-21 DIAGNOSIS — N189 Chronic kidney disease, unspecified: Secondary | ICD-10-CM | POA: Diagnosis not present

## 2021-11-21 DIAGNOSIS — E039 Hypothyroidism, unspecified: Secondary | ICD-10-CM | POA: Diagnosis not present

## 2021-11-21 DIAGNOSIS — R072 Precordial pain: Secondary | ICD-10-CM | POA: Diagnosis not present

## 2021-11-21 DIAGNOSIS — R519 Headache, unspecified: Secondary | ICD-10-CM | POA: Diagnosis not present

## 2021-11-21 DIAGNOSIS — R400 Somnolence: Secondary | ICD-10-CM | POA: Diagnosis not present

## 2021-11-21 DIAGNOSIS — R079 Chest pain, unspecified: Secondary | ICD-10-CM | POA: Diagnosis not present

## 2021-11-21 DIAGNOSIS — Z20822 Contact with and (suspected) exposure to covid-19: Secondary | ICD-10-CM | POA: Diagnosis not present

## 2021-11-21 DIAGNOSIS — I10 Essential (primary) hypertension: Secondary | ICD-10-CM | POA: Diagnosis not present

## 2021-11-21 DIAGNOSIS — Z79899 Other long term (current) drug therapy: Secondary | ICD-10-CM | POA: Diagnosis not present

## 2021-11-21 DIAGNOSIS — Z723 Lack of physical exercise: Secondary | ICD-10-CM | POA: Insufficient documentation

## 2021-11-21 DIAGNOSIS — R0689 Other abnormalities of breathing: Secondary | ICD-10-CM | POA: Diagnosis not present

## 2021-11-21 DIAGNOSIS — R4182 Altered mental status, unspecified: Secondary | ICD-10-CM | POA: Diagnosis not present

## 2021-11-21 DIAGNOSIS — R2681 Unsteadiness on feet: Secondary | ICD-10-CM | POA: Diagnosis not present

## 2021-11-21 DIAGNOSIS — R531 Weakness: Secondary | ICD-10-CM | POA: Diagnosis not present

## 2021-11-21 DIAGNOSIS — Z96651 Presence of right artificial knee joint: Secondary | ICD-10-CM | POA: Insufficient documentation

## 2021-11-21 DIAGNOSIS — I129 Hypertensive chronic kidney disease with stage 1 through stage 4 chronic kidney disease, or unspecified chronic kidney disease: Secondary | ICD-10-CM | POA: Insufficient documentation

## 2021-11-21 DIAGNOSIS — F0390 Unspecified dementia without behavioral disturbance: Secondary | ICD-10-CM | POA: Insufficient documentation

## 2021-11-21 DIAGNOSIS — Z7901 Long term (current) use of anticoagulants: Secondary | ICD-10-CM | POA: Insufficient documentation

## 2021-11-21 DIAGNOSIS — I700 Atherosclerosis of aorta: Secondary | ICD-10-CM | POA: Diagnosis not present

## 2021-11-21 DIAGNOSIS — R5381 Other malaise: Secondary | ICD-10-CM

## 2021-11-21 LAB — BASIC METABOLIC PANEL
Anion gap: 12 (ref 5–15)
BUN: 9 mg/dL (ref 8–23)
CO2: 25 mmol/L (ref 22–32)
Calcium: 10.4 mg/dL — ABNORMAL HIGH (ref 8.9–10.3)
Chloride: 100 mmol/L (ref 98–111)
Creatinine, Ser: 0.71 mg/dL (ref 0.44–1.00)
GFR, Estimated: >60 mL/min (ref >60)
Glucose, Bld: 90 mg/dL (ref 70–99)
Potassium: 3.6 mmol/L (ref 3.5–5.1)
Sodium: 137 mmol/L (ref 135–145)

## 2021-11-21 LAB — RESP PANEL BY RT-PCR (FLU A&B, COVID) ARPGX2
Influenza A by PCR: NEGATIVE
Influenza B by PCR: NEGATIVE
SARS Coronavirus 2 by RT PCR: NEGATIVE

## 2021-11-21 LAB — URINALYSIS, ROUTINE W REFLEX MICROSCOPIC
Bilirubin Urine: NEGATIVE
Glucose, UA: NEGATIVE mg/dL
Ketones, ur: 5 mg/dL — AB
Leukocytes,Ua: NEGATIVE
Nitrite: NEGATIVE
Protein, ur: 30 mg/dL — AB
Specific Gravity, Urine: 1.005 (ref 1.005–1.030)
Squamous Epithelial / HPF: NONE SEEN (ref 0–5)
pH: 9 — ABNORMAL HIGH (ref 5.0–8.0)

## 2021-11-21 LAB — CBC
HCT: 38.2 % (ref 36.0–46.0)
Hemoglobin: 12.6 g/dL (ref 12.0–15.0)
MCH: 29.4 pg (ref 26.0–34.0)
MCHC: 33 g/dL (ref 30.0–36.0)
MCV: 89.3 fL (ref 80.0–100.0)
Platelets: 309 10*3/uL (ref 150–400)
RBC: 4.28 MIL/uL (ref 3.87–5.11)
RDW: 16.1 % — ABNORMAL HIGH (ref 11.5–15.5)
WBC: 6.8 10*3/uL (ref 4.0–10.5)
nRBC: 0 % (ref 0.0–0.2)

## 2021-11-21 LAB — CBG MONITORING, ED: Glucose-Capillary: 87 mg/dL (ref 70–99)

## 2021-11-21 LAB — TROPONIN I (HIGH SENSITIVITY)
Troponin I (High Sensitivity): 8 ng/L (ref <18)
Troponin I (High Sensitivity): 9 ng/L (ref <18)

## 2021-11-21 MED ORDER — KETOROLAC TROMETHAMINE 30 MG/ML IJ SOLN
15.0000 mg | Freq: Once | INTRAMUSCULAR | Status: AC
  Administered 2021-11-21: 23:00:00 15 mg via INTRAVENOUS
  Filled 2021-11-21: qty 1

## 2021-11-21 MED ORDER — ONDANSETRON HCL 4 MG/2ML IJ SOLN
4.0000 mg | Freq: Once | INTRAMUSCULAR | Status: AC
  Administered 2021-11-21: 21:00:00 4 mg via INTRAVENOUS
  Filled 2021-11-21: qty 2

## 2021-11-21 MED ORDER — ACETAMINOPHEN 325 MG PO TABS
650.0000 mg | ORAL_TABLET | Freq: Once | ORAL | Status: AC
  Administered 2021-11-21: 650 mg via ORAL
  Filled 2021-11-21: qty 2

## 2021-11-21 MED ORDER — SODIUM CHLORIDE 0.9% FLUSH
10.0000 mL | INTRAVENOUS | Status: DC | PRN
  Administered 2021-11-21: 18:00:00 10 mL

## 2021-11-21 MED ORDER — ONDANSETRON HCL 4 MG/2ML IJ SOLN
4.0000 mg | Freq: Once | INTRAMUSCULAR | Status: AC
  Administered 2021-11-21: 20:00:00 4 mg via INTRAVENOUS
  Filled 2021-11-21: qty 2

## 2021-11-21 MED ORDER — LABETALOL HCL 5 MG/ML IV SOLN: 5.0000 mg | Freq: Once | INTRAVENOUS | Status: DC

## 2021-11-21 MED ORDER — SODIUM CHLORIDE 0.9% FLUSH
10.0000 mL | Freq: Two times a day (BID) | INTRAVENOUS | Status: DC
  Administered 2021-11-21 – 2021-11-22 (×2): 10 mL

## 2021-11-21 NOTE — ED Triage Notes (Signed)
Pt comes via EMS with c/o CP. Pt also states seen here yesterday for hypertension. EMs placed 2 inch of nitro paste on chest. Pt is oriented per EMS. EMS reports pupils reactive and equal. Pt at baseline per EMS.  Pt has 22 g in left breast.  Pt mouthing when asked questions. Unable to understand pt.   BP-200/99, CBG-107 98% RA, NSR of 80
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
## 2021-11-21 NOTE — ED Notes (Signed)
Unable to get temp at this time.

## 2021-11-21 NOTE — ED Notes (Signed)
Pt noted to desat when sleeping. Pt endorsed hx of sleep apnea but refused cpap. Pt stated she wears 2L oxygen at home at night. Placed on 2L nasal cannula at this time. Will continue to monitor.

## 2021-11-22 DIAGNOSIS — Z743 Need for continuous supervision: Secondary | ICD-10-CM | POA: Diagnosis not present

## 2021-11-22 DIAGNOSIS — R5381 Other malaise: Secondary | ICD-10-CM | POA: Diagnosis not present

## 2021-11-22 DIAGNOSIS — R69 Illness, unspecified: Secondary | ICD-10-CM | POA: Diagnosis not present

## 2021-11-22 MED ORDER — AMLODIPINE BESYLATE 5 MG PO TABS
2.5000 mg | ORAL_TABLET | Freq: Every day | ORAL | Status: DC | PRN
  Administered 2021-11-22: 09:00:00 2.5 mg via ORAL
  Filled 2021-11-22: qty 1

## 2021-11-22 MED ORDER — MELATONIN 5 MG PO TABS: 2.5000 mg | ORAL_TABLET | Freq: Once | ORAL | Status: DC

## 2021-11-22 MED ORDER — LEVOTHYROXINE SODIUM 50 MCG PO TABS
50.0000 ug | ORAL_TABLET | Freq: Every day | ORAL | Status: DC
  Administered 2021-11-22: 09:00:00 50 ug via ORAL
  Filled 2021-11-22: qty 1

## 2021-11-22 MED ORDER — APIXABAN 5 MG PO TABS
5.0000 mg | ORAL_TABLET | Freq: Two times a day (BID) | ORAL | Status: DC
  Administered 2021-11-22: 09:00:00 5 mg via ORAL
  Filled 2021-11-22: qty 1

## 2021-11-22 MED ORDER — LORAZEPAM 0.5 MG PO TABS
0.5000 mg | ORAL_TABLET | Freq: Three times a day (TID) | ORAL | Status: DC | PRN
  Administered 2021-11-22 (×2): 0.5 mg via ORAL
  Filled 2021-11-22 (×2): qty 1

## 2021-11-22 MED ORDER — METOPROLOL TARTRATE 25 MG PO TABS
25.0000 mg | ORAL_TABLET | Freq: Two times a day (BID) | ORAL | Status: DC
  Administered 2021-11-22: 09:00:00 25 mg via ORAL
  Filled 2021-11-22: qty 1

## 2021-11-22 MED ORDER — PANTOPRAZOLE SODIUM 40 MG PO TBEC
40.0000 mg | DELAYED_RELEASE_TABLET | Freq: Every day | ORAL | Status: DC
  Administered 2021-11-22: 09:00:00 40 mg via ORAL
  Filled 2021-11-22: qty 1

## 2021-11-22 MED ORDER — SPIRONOLACTONE 25 MG PO TABS
25.0000 mg | ORAL_TABLET | Freq: Every day | ORAL | Status: DC
  Administered 2021-11-22: 09:00:00 25 mg via ORAL
  Filled 2021-11-22: qty 1

## 2021-11-22 MED ORDER — ATORVASTATIN CALCIUM 20 MG PO TABS
40.0000 mg | ORAL_TABLET | Freq: Every day | ORAL | Status: DC
  Administered 2021-11-22: 09:00:00 40 mg via ORAL
  Filled 2021-11-22: qty 2

## 2021-11-22 NOTE — TOC Transition Note (Signed)
Transition of Care [facility]) - CM/SW Discharge Note   Patient Details  Name: Erin Sutton MRN: [id number] Date of Birth: 1941-03-30  Transition of Care [facility]) CM/SW Contact:  Shelbie Hutching, RN Phone Number: 11/22/2021, 4:15 PM   Clinical Narrative:    Patient has decided that she does not want to stay here in the hospital over the weekend.  She reports that she would like to go home with home health services and chooses [facility] as she just had them recently after knee surgery.  Patient has her 2 walkers and elevated toilet with hand rails, she reports she can move around good enough at home.  Merleen Nicely with [facility] accepts referral for [facility] RN, PT, OT and SW.   Patient does need EMS home.  ED secretary has called for EMS transport to get patient home. Daughter Tammy notified that patient had decided to discharge home.     Final next level of care: [place] Barriers to Discharge: Barriers Resolved   Patient Goals and CMS Choice Patient states their goals for this hospitalization and ongoing recovery are:: Patient wants to go home with home health services CMS Medicare.gov Compare Post Acute Care list provided to:: Patient Choice offered to / list presented to : Patient  Discharge Placement                Patient to be transferred to facility by: Transferred home via [facility] Name of family member notified: Tammy Patient and family notified of of transfer: 11/22/21  Discharge Plan and Services   Discharge Planning Services: CM Consult Post Acute Care Choice: [place]          DME Arranged: N/A DME Agency: NA       HH Arranged: RN, PT, OT, Social Work [place] Agency: [facility] Date [place] Agency Contacted: 11/22/21 Time [place]: [id number] Representative spoke with at [place]: [place] ([place]) Interventions     Readmission Risk Interventions No flowsheet data found.

## 2021-11-22 NOTE — ED Notes (Signed)
PT and TOC have assessed Pt.  Pt aware of SNF/Rehab plans and seems to be willing.
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
## 2021-11-22 NOTE — ED Provider Notes (Signed)
----------------------------------------- °  6:16 AM on 11/22/2021 -----------------------------------------   Blood pressure 107/61, pulse 85, temperature 97.9 F (36.6 C), resp. rate 15, SpO2 99 %.  The patient is calm and cooperative at this time.  There have been no acute events since the last update.  Awaiting disposition plan from social worker.   Paulette Blanch, MD 11/22/21 [phone]

## 2021-11-22 NOTE — ED Notes (Signed)
Called [facility] for transport to [place], [place]

## 2021-11-22 NOTE — ED Notes (Signed)
Social Work back at the bedside to discuss pt options per pt request.

## 2021-11-22 NOTE — Evaluation (Signed)
Occupational Therapy Evaluation Patient Details Name: Erin Sutton MRN: [id number] DOB: February 23, 1941 Today's Date: 11/22/2021   History of Present Illness 80 year old female with a history of reported dementia, paroxysmal atrial fibrillation on Eliquis, anxiety, history of uncontrolled hypertension presents to the ER today via EMS.  Patient unable to give history or review of systems due to confusion and altered mental status.   Clinical Impression   Patient presenting with decreased independence in self care, balance, functional mobility/transfers, endurance, and safety awareness. Patient reports living at home alone and using 3 wheeled walker for PTA. Pt having difficulty keeping eyes open during session but also reporting chest pain. When covers removed, pts frustration increased she declined all OOB tasks even though therapist very clearly reports why I was there and the expectations for mobility. Max A for LB self care. Patient will benefit from acute OT to increase overall independence in the areas of ADLs, functional mobility, and safety awareness in order to safely discharge to next venue of care.      Recommendations for follow up therapy are one component of a multi-disciplinary discharge planning process, led by the attending physician.  Recommendations may be updated based on patient status, additional functional criteria and insurance authorization.   Follow Up Recommendations  Skilled nursing-short term rehab (<3 hours/day)    Assistance Recommended at Discharge Frequent or constant Supervision/Assistance  Functional Status Assessment  Patient has had a recent decline in their functional status and demonstrates the ability to make significant improvements in function in a reasonable and predictable amount of time.  Equipment Recommendations  Other (comment) (defer to next venue of care)       Precautions / Restrictions Precautions Precautions: Fall Precaution Comments: mod  fall risk      Mobility Bed Mobility Overal bed mobility: Needs Assistance Bed Mobility: Rolling Rolling: Min assist              Transfers                   General transfer comment: Pt refusal for OOB          ADL either performed or assessed with clinical judgement   ADL Overall ADL's : Needs assistance/impaired     Grooming: Wash/dry hands;Wash/dry face;Supervision/safety;Set up;Bed level               Lower Body Dressing: Maximal assistance                 General ADL Comments: Pt refusing transfers     Vision Patient Visual Report: No change from baseline              Pertinent Vitals/Pain Pain Assessment: 0-10 Pain Score: 2  Pain Location: chest Pain Descriptors / Indicators: Discomfort Pain Intervention(s): Other (comment) (RN notified)     Hand Dominance Right   Extremity/Trunk Assessment Upper Extremity Assessment Upper Extremity Assessment: Generalized weakness   Lower Extremity Assessment Lower Extremity Assessment: Generalized weakness       Communication Communication Communication: No difficulties   Cognition Arousal/Alertness: Awake/alert Behavior During Therapy: WFL for tasks assessed/performed Overall Cognitive Status: Within Functional Limits for tasks assessed                                                  Home Living Family/patient expects to be discharged to:: Private residence  Living Arrangements: Alone Available Help at Discharge: Personal care attendant;Available PRN/intermittently Type of Home: Mobile home Home Access: Stairs to enter;Ramped entrance Entrance Stairs-Number of Steps: 3 steps but ramp is built Entrance Stairs-Rails: Right Home Layout: One level     Bathroom Shower/Tub: Teacher, early years/pre: Standard Bathroom Accessibility: No   Home Equipment: Cane - single point;BSC/3in1;Tub bench;Other (comment)   Additional Comments: 3-wheel  walker      Prior Functioning/Environment Prior Level of Function : Independent/Modified Independent             Mobility Comments: uses 3-wheeled walker inside mobile hoem due to narrow door frames. knee pain mildly limiting ambulation since TKA surgery this fall. ADLs Comments: pt performs ADL INDly, not driving since TKA, has hired help for [employer] and shopping        OT Problem List: Decreased strength;Impaired balance (sitting and/or standing);Decreased cognition;Decreased activity tolerance;Decreased coordination;Decreased safety awareness;Decreased knowledge of use of DME or AE      OT Treatment/Interventions: Self-care/ADL training;DME and/or AE instruction;Therapeutic activities;Balance training;Therapeutic exercise;Energy conservation;Patient/family education    OT Goals(Current goals can be found in the care plan section) Acute Rehab OT Goals Patient Stated Goal: I want a warm blanket OT Goal Formulation: With patient Time For Goal Achievement: 12/06/21 Potential to Achieve Goals: Fair  OT Frequency: Min 2X/week   Barriers to D/C: Decreased caregiver support  lives alone          AM-PAC OT "6 Clicks" Daily Activity     Outcome Measure Help from another person eating meals?: None Help from another person taking care of personal grooming?: None Help from another person toileting, which includes using toliet, bedpan, or urinal?: A Lot Help from another person bathing (including washing, rinsing, drying)?: A Lot Help from another person to put on and taking off regular upper body clothing?: A Little Help from another person to put on and taking off regular lower body clothing?: A Lot 6 Click Score: 17   End of Session Nurse Communication: Mobility status  Activity Tolerance: Patient limited by fatigue;Other (comment) (self limiting) Patient left: with call bell/phone within reach;in bed  OT Visit Diagnosis: Unsteadiness on feet (R26.81);Muscle weakness  (generalized) (M62.81)                Time: 7616-0737 OT Time Calculation (min): 20 min Charges:  OT General Charges $OT Visit: 1 Visit OT Evaluation $OT Eval Moderate Complexity: 1 Mod OT Treatments $Self Care/Home Management : 8-22 mins  Darleen Crocker, MS, OTR/L , CBIS ascom [phone]  11/22/21, 1:11 PM

## 2021-11-22 NOTE — Evaluation (Signed)
Physical Therapy Evaluation Patient Details Name: SHAMEL GALYEAN MRN: [id number] DOB: 08-19-1941 Today's Date: 11/22/2021  History of Present Illness  Pt is an 80 y/o F who presented to the ED on 11/20/21 with c/c of HTN, HA, chest tightness & pressure & pt was discharged that day. Pt presented again on 11/21/21 with c/o chest pain & AMS. Intially pt was only able to mouth words & unable to transfer from her seat to EMS stretcher without significant assistance. Pt's mental status is improving with normalization of BP but her weakness remains persistent. PMH: HTN, hypothyroidism, anxiety, CKD, GERD, sleep apnea, urinary incontinence, osteoporosis  Clinical Impression  Pt seen for PT evaluation with pt fatigued throughout session, requiring cuing to maintain open eyes. Pt does c/o unrated chest pain at beginning of session - nurse already aware. Pt noted to have low BP in supine that increases with sitting EOB. Pt requires min assist to transfer supine<>sit and min assist for static sitting EOB. Standing deferred 2/2 pt fatigue & pt also unable to participate in bed level exercises at this time. Pt clearly demonstrates BLE & total body weakness & is unsafe to d/c home alone at this time. Pt would benefit from STR upon d/c to maximize independence with functional mobility & reduce fall risk prior to return home.  BP checked in LUE: Semi fowler in bed: 92/68 mmHg MAP 77 Sitting EOB: 166/95 mmhg MAP 114 Semi fowler in bed: 167/86 mmHg MAP 110     Recommendations for follow up therapy are one component of a multi-disciplinary discharge planning process, led by the attending physician.  Recommendations may be updated based on patient status, additional functional criteria and insurance authorization.  Follow Up Recommendations Skilled nursing-short term rehab (<3 hours/day)    Assistance Recommended at Discharge Frequent or constant Supervision/Assistance  Functional Status Assessment Patient has had  a recent decline in their functional status and demonstrates the ability to make significant improvements in function in a reasonable and predictable amount of time.  Equipment Recommendations   (TBD in next venue)    Recommendations for Other Services       Precautions / Restrictions Precautions Precautions: Fall Precaution Comments: mod fall risk Restrictions Weight Bearing Restrictions: No      Mobility  Bed Mobility Overal bed mobility: Needs Assistance Bed Mobility: Supine to Sit;Sit to Supine Rolling: Min assist   Supine to sit: Min assist;HOB elevated Sit to supine: Min assist;HOB elevated   General bed mobility comments: pulls to sitting with PT UE support    Transfers                   General transfer comment: Pt refusal for OOB    Ambulation/Gait                  Stairs            Wheelchair Mobility    Modified Rankin (Stroke Patients Only)       Balance Overall balance assessment: Needs assistance Sitting-balance support: Feet unsupported;Bilateral upper extremity supported Sitting balance-Leahy Scale: Poor Sitting balance - Comments: cuing to attempt to maintain upright posture Postural control: Right lateral lean;Posterior lean                                   Pertinent Vitals/Pain C/o unrated chest pain at beginning of session - nurse already aware    Home Living Family/patient expects  to be discharged to:: Private residence Living Arrangements: Alone Available Help at Discharge: Personal care attendant;Available PRN/intermittently Type of Home: Mobile home Home Access: Stairs to enter;Ramped entrance Entrance Stairs-Rails: Right Entrance Stairs-Number of Steps: 3 steps but ramp is built   Home Layout: One level Home Equipment: Cane - single point;BSC/3in1;Tub bench;Other (comment) Additional Comments: 3-wheel walker    Prior Function Prior Level of Function : Independent/Modified Independent              Mobility Comments: uses 3-wheeled walker inside mobile hoem due to narrow door frames. knee pain mildly limiting ambulation since TKA surgery 10/09/21 ADLs Comments: pt performs ADL INDly, not driving since TKA, has hired help for [employer] and shopping     Hand Dominance   Dominant Hand: Right    Extremity/Trunk Assessment   Upper Extremity Assessment Upper Extremity Assessment: Generalized weakness    Lower Extremity Assessment Lower Extremity Assessment: Generalized weakness (2/5 L knee extension in sitting, 3-/5 R knee extension in sitting)    Cervical / Trunk Assessment Cervical / Trunk Assessment: Normal  Communication   Communication: No difficulties  Cognition Arousal/Alertness: Lethargic Behavior During Therapy: WFL for tasks assessed/performed Overall Cognitive Status: Difficult to assess                                 General Comments: AxOx4 but pt requires cuing to open eyes, maintain positioning during session. Pt closing eyes frequently throughout session but not simultaneously. General odd behavior, nurse aware.        General Comments General comments (skin integrity, edema, etc.): too fatigued to attempt bed level BLE exercises    Exercises     Assessment/Plan    PT Assessment Patient needs continued PT services  PT Problem List Decreased strength;Cardiopulmonary status limiting activity;Decreased activity tolerance;Decreased knowledge of use of DME;Decreased balance;Decreased mobility;Decreased knowledge of precautions;Decreased safety awareness       PT Treatment Interventions DME instruction;Therapeutic exercise;Gait training;Balance training;Stair training;Neuromuscular re-education;Functional mobility training;Therapeutic activities;Patient/family education;Cognitive remediation;Modalities    PT Goals (Current goals can be found in the Care Plan section)  Acute Rehab PT Goals Patient Stated Goal: none stated PT  Goal Formulation: With patient Time For Goal Achievement: 12/06/21 Potential to Achieve Goals: Fair    Frequency Min 2X/week   Barriers to discharge Decreased caregiver support      Co-evaluation               AM-PAC PT "6 Clicks" Mobility  Outcome Measure Help needed turning from your back to your side while in a flat bed without using bedrails?: A Little Help needed moving from lying on your back to sitting on the side of a flat bed without using bedrails?: A Lot Help needed moving to and from a bed to a chair (including a wheelchair)?: A Lot Help needed standing up from a chair using your arms (e.g., wheelchair or bedside chair)?: A Lot Help needed to walk in hospital room?: A Lot Help needed climbing 3-5 steps with a railing? : Total 6 Click Score: 12    End of Session   Activity Tolerance: Patient limited by fatigue Patient left: in bed;with call bell/phone within reach Nurse Communication: Mobility status (vitals, behavior) PT Visit Diagnosis: Unsteadiness on feet (R26.81);Muscle weakness (generalized) (M62.81);Difficulty in walking, not elsewhere classified (R26.2)    Time: 6546-5035 PT Time Calculation (min) (ACUTE ONLY): 10 min   Charges:   PT Evaluation $PT Eval  Low Complexity: 1 Low          Lavone Nian, PT, DPT 11/22/21, 10:50 AM   Waunita Schooner 11/22/2021, 10:47 AM

## 2021-11-22 NOTE — ED Provider Notes (Signed)
Patient demanding discharge from the hospital indicating that she would rather just go home than be stuck in the ED.  Social work/case management has been working with the patient and was able to set [facility] pending [facility], but currently there is a wait time due to a COVID outbreak at this facility.  Patient reports that she has not gone a weight in the ED this weekend over the new year holidays and says that she will just go home.  I reassessed the patient and she is cogent and has capacity to make this decision.  We discussed the risks of going home preemptively, such as falling, getting more sick and possibly even dying.  She acknowledges these risks and says that she is fine with it and just wants to go home.  We have been able to set up home health services for her to assist with outpatient management as long as possible.  I discussed with her return precautions for the ED she expresses understanding and agreement.  We will set up transport for her back home.  Patient is suitable for outpatient management at this time.   Vladimir Crofts, MD 11/22/21 [phone]

## 2021-11-22 NOTE — TOC Progression Note (Signed)
Transition of Care [facility]) - Progression Note    Patient Details  Name: Erin Sutton MRN: [id number] Date of Birth: 1941-08-14  Transition of Care [facility]) CM/SW Contact  Shelbie Hutching, RN Phone Number: 11/22/2021, 11:57 AM  Clinical Narrative:    RNCM met with patient at the bedside this morning.  She is lethargic and did not want to work with OT saying she did not feel well.  Patient is not going to be admitted to the hospital, she agrees with rehab but only if she can go [facility], if [facility] will not accept then she will not go.  RNCM will send referral to [facility].    Expected Discharge Plan: [place] Barriers to Discharge: Continued Medical Work up  Expected Discharge Plan and Services Expected Discharge Plan: [place]   Discharge Planning Services: CM Consult Post Acute Care Choice: [place] Living arrangements for the past 2 months: Mobile Home                 DME Arranged: N/A DME Agency: NA                   Social Determinants of Health (SDOH) Interventions    Readmission Risk Interventions No flowsheet data found.

## 2021-11-22 NOTE — ED Notes (Signed)
Upon introductions, Pt was unaware she is holding for potential SNF/Rehab placement.  Sts she "wasn't here because of her knee and is weak because she hasn't eaten in 2-3 days."  Sts she was only here for the chest pain complaint and dehydration.  Pt reassured her medical workup was good, but the EDP is concerned about sending her home d/t weakness and pain from her knee surgery.
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
## 2021-11-22 NOTE — ED Notes (Signed)
Pt updated with plan of care. Pt tearful when told she may be in the ED for a few more days awaiting placement. Pt given antianxiety medication.

## 2021-11-22 NOTE — ED Notes (Signed)
Pt oxygen saturations fall to 80% intermittently while sleeping. Pt refusing to wear oxygen while asleep.

## 2021-11-24 ENCOUNTER — Ambulatory Visit
Admission: EM | Admit: 2021-11-24 | Discharge: 2021-11-24 | Disposition: A | Discharge status: Home / Self Care | Payer: Medicare HMO | Attending: Emergency Medicine | Admitting: Emergency Medicine

## 2021-11-24 ENCOUNTER — Other Ambulatory Visit: Payer: Self-pay

## 2021-11-24 DIAGNOSIS — R300 Dysuria: Secondary | ICD-10-CM | POA: Diagnosis not present

## 2021-11-24 LAB — URINALYSIS, COMPLETE (UACMP) WITH MICROSCOPIC
Glucose, UA: NEGATIVE mg/dL
Nitrite: NEGATIVE
Protein, ur: 30 mg/dL — AB
Specific Gravity, Urine: 1.025 (ref 1.005–1.030)
pH: 6.5 (ref 5.0–8.0)

## 2021-11-24 LAB — WET PREP, GENITAL
Clue Cells Wet Prep HPF POC: NONE SEEN
Sperm: NONE SEEN
Trich, Wet Prep: NONE SEEN
WBC, Wet Prep HPF POC: <10 — AB (ref <10)
Yeast Wet Prep HPF POC: NONE SEEN

## 2021-11-24 MED ORDER — PHENAZOPYRIDINE HCL 200 MG PO TABS: 200.0000 mg | ORAL_TABLET | Freq: Three times a day (TID) | ORAL | 0 refills | Status: DC

## 2021-11-24 MED ORDER — NITROFURANTOIN MONOHYD MACRO 100 MG PO CAPS: 100.0000 mg | ORAL_CAPSULE | Freq: Two times a day (BID) | ORAL | 0 refills | Status: DC
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
## 2021-11-24 NOTE — Discharge Instructions (Addendum)
Take the Macrobid twice daily for 5 days with food for treatment of urinary tract infection. ° °Use the Pyridium every 8 hours as needed for urinary discomfort.  This will turn your urine a bright red-orange. ° °Increase your oral fluid intake so that you increase your urine production and or flushing your urinary system. ° °Take an over-the-counter probiotic, such as Culturelle-Align-Activia, 1 hour after each dose of antibiotic to prevent diarrhea or yeast infections from forming. ° °We will culture urine and change the antibiotics if necessary. ° °Return for reevaluation, or see your primary care provider, for any new or worsening symptoms.  °

## 2021-11-24 NOTE — ED Triage Notes (Signed)
Pt here with niece, started with burning and irritation with urine 2 weeks ago when she was in the hospital, got a little better but has came back.

## 2021-11-25 DIAGNOSIS — I4891 Unspecified atrial fibrillation: Secondary | ICD-10-CM | POA: Diagnosis not present

## 2021-11-25 DIAGNOSIS — G459 Transient cerebral ischemic attack, unspecified: Secondary | ICD-10-CM | POA: Diagnosis not present

## 2021-11-26 DIAGNOSIS — Z96652 Presence of left artificial knee joint: Secondary | ICD-10-CM | POA: Diagnosis not present

## 2021-11-26 DIAGNOSIS — Z96651 Presence of right artificial knee joint: Secondary | ICD-10-CM | POA: Diagnosis not present

## 2021-11-27 DIAGNOSIS — R634 Abnormal weight loss: Secondary | ICD-10-CM | POA: Diagnosis not present

## 2021-11-27 DIAGNOSIS — N390 Urinary tract infection, site not specified: Secondary | ICD-10-CM | POA: Diagnosis not present

## 2021-11-27 DIAGNOSIS — R110 Nausea: Secondary | ICD-10-CM | POA: Diagnosis not present

## 2021-11-27 DIAGNOSIS — G4700 Insomnia, unspecified: Secondary | ICD-10-CM | POA: Diagnosis not present

## 2021-11-27 LAB — URINE CULTURE

## 2021-12-02 ENCOUNTER — Emergency Department: Payer: Medicare HMO

## 2021-12-02 ENCOUNTER — Other Ambulatory Visit: Payer: Self-pay

## 2021-12-02 ENCOUNTER — Observation Stay
Admission: EM | Admit: 2021-12-02 | Discharge: 2021-12-06 | Disposition: A | Discharge status: Skilled Nursing Facility | Payer: Medicare HMO | Attending: Internal Medicine | Admitting: Internal Medicine

## 2021-12-02 ENCOUNTER — Observation Stay: Payer: Medicare HMO

## 2021-12-02 DIAGNOSIS — G9340 Encephalopathy, unspecified: Secondary | ICD-10-CM | POA: Insufficient documentation

## 2021-12-02 DIAGNOSIS — G459 Transient cerebral ischemic attack, unspecified: Secondary | ICD-10-CM | POA: Diagnosis not present

## 2021-12-02 DIAGNOSIS — F411 Generalized anxiety disorder: Secondary | ICD-10-CM | POA: Diagnosis present

## 2021-12-02 DIAGNOSIS — K219 Gastro-esophageal reflux disease without esophagitis: Secondary | ICD-10-CM | POA: Diagnosis present

## 2021-12-02 DIAGNOSIS — E039 Hypothyroidism, unspecified: Secondary | ICD-10-CM | POA: Diagnosis not present

## 2021-12-02 DIAGNOSIS — I480 Paroxysmal atrial fibrillation: Secondary | ICD-10-CM | POA: Diagnosis not present

## 2021-12-02 DIAGNOSIS — N189 Chronic kidney disease, unspecified: Secondary | ICD-10-CM | POA: Diagnosis not present

## 2021-12-02 DIAGNOSIS — Z96652 Presence of left artificial knee joint: Secondary | ICD-10-CM | POA: Diagnosis not present

## 2021-12-02 DIAGNOSIS — N939 Abnormal uterine and vaginal bleeding, unspecified: Secondary | ICD-10-CM | POA: Insufficient documentation

## 2021-12-02 DIAGNOSIS — A419 Sepsis, unspecified organism: Secondary | ICD-10-CM

## 2021-12-02 DIAGNOSIS — Z7901 Long term (current) use of anticoagulants: Secondary | ICD-10-CM

## 2021-12-02 DIAGNOSIS — I10 Essential (primary) hypertension: Secondary | ICD-10-CM | POA: Diagnosis present

## 2021-12-02 DIAGNOSIS — R410 Disorientation, unspecified: Secondary | ICD-10-CM | POA: Diagnosis present

## 2021-12-02 DIAGNOSIS — R112 Nausea with vomiting, unspecified: Secondary | ICD-10-CM | POA: Diagnosis present

## 2021-12-02 DIAGNOSIS — I674 Hypertensive encephalopathy: Secondary | ICD-10-CM

## 2021-12-02 DIAGNOSIS — R2681 Unsteadiness on feet: Secondary | ICD-10-CM | POA: Diagnosis not present

## 2021-12-02 DIAGNOSIS — G4489 Other headache syndrome: Secondary | ICD-10-CM | POA: Diagnosis not present

## 2021-12-02 DIAGNOSIS — I700 Atherosclerosis of aorta: Secondary | ICD-10-CM | POA: Diagnosis not present

## 2021-12-02 DIAGNOSIS — I6523 Occlusion and stenosis of bilateral carotid arteries: Secondary | ICD-10-CM | POA: Diagnosis not present

## 2021-12-02 DIAGNOSIS — I130 Hypertensive heart and chronic kidney disease with heart failure and stage 1 through stage 4 chronic kidney disease, or unspecified chronic kidney disease: Secondary | ICD-10-CM | POA: Diagnosis not present

## 2021-12-02 DIAGNOSIS — G4730 Sleep apnea, unspecified: Secondary | ICD-10-CM | POA: Diagnosis present

## 2021-12-02 DIAGNOSIS — F0390 Unspecified dementia without behavioral disturbance: Secondary | ICD-10-CM | POA: Diagnosis present

## 2021-12-02 DIAGNOSIS — R531 Weakness: Secondary | ICD-10-CM

## 2021-12-02 DIAGNOSIS — R269 Unspecified abnormalities of gait and mobility: Secondary | ICD-10-CM

## 2021-12-02 DIAGNOSIS — Z20822 Contact with and (suspected) exposure to covid-19: Secondary | ICD-10-CM | POA: Insufficient documentation

## 2021-12-02 DIAGNOSIS — Z79899 Other long term (current) drug therapy: Secondary | ICD-10-CM | POA: Insufficient documentation

## 2021-12-02 DIAGNOSIS — R7989 Other specified abnormal findings of blood chemistry: Secondary | ICD-10-CM | POA: Diagnosis present

## 2021-12-02 DIAGNOSIS — I5032 Chronic diastolic (congestive) heart failure: Secondary | ICD-10-CM | POA: Diagnosis not present

## 2021-12-02 DIAGNOSIS — I639 Cerebral infarction, unspecified: Secondary | ICD-10-CM | POA: Diagnosis not present

## 2021-12-02 DIAGNOSIS — R5383 Other fatigue: Secondary | ICD-10-CM | POA: Diagnosis not present

## 2021-12-02 DIAGNOSIS — R29818 Other symptoms and signs involving the nervous system: Secondary | ICD-10-CM | POA: Diagnosis not present

## 2021-12-02 DIAGNOSIS — I611 Nontraumatic intracerebral hemorrhage in hemisphere, cortical: Secondary | ICD-10-CM | POA: Diagnosis not present

## 2021-12-02 DIAGNOSIS — N390 Urinary tract infection, site not specified: Secondary | ICD-10-CM | POA: Diagnosis not present

## 2021-12-02 DIAGNOSIS — R0689 Other abnormalities of breathing: Secondary | ICD-10-CM | POA: Diagnosis not present

## 2021-12-02 DIAGNOSIS — I6782 Cerebral ischemia: Secondary | ICD-10-CM | POA: Diagnosis not present

## 2021-12-02 DIAGNOSIS — F419 Anxiety disorder, unspecified: Secondary | ICD-10-CM | POA: Diagnosis present

## 2021-12-02 DIAGNOSIS — R000 Tachycardia, unspecified: Secondary | ICD-10-CM | POA: Diagnosis not present

## 2021-12-02 DIAGNOSIS — E785 Hyperlipidemia, unspecified: Secondary | ICD-10-CM | POA: Diagnosis present

## 2021-12-02 DIAGNOSIS — R778 Other specified abnormalities of plasma proteins: Secondary | ICD-10-CM | POA: Diagnosis present

## 2021-12-02 LAB — COMPREHENSIVE METABOLIC PANEL
ALT: 10 U/L (ref 0–44)
AST: 18 U/L (ref 15–41)
Albumin: 3.8 g/dL (ref 3.5–5.0)
Alkaline Phosphatase: 48 U/L (ref 38–126)
Anion gap: 9 (ref 5–15)
BUN: 11 mg/dL (ref 8–23)
CO2: 25 mmol/L (ref 22–32)
Calcium: 9.7 mg/dL (ref 8.9–10.3)
Chloride: 98 mmol/L (ref 98–111)
Creatinine, Ser: 0.77 mg/dL (ref 0.44–1.00)
GFR, Estimated: >60 mL/min (ref >60)
Glucose, Bld: 102 mg/dL — ABNORMAL HIGH (ref 70–99)
Potassium: 4 mmol/L (ref 3.5–5.1)
Sodium: 132 mmol/L — ABNORMAL LOW (ref 135–145)
Total Bilirubin: 0.6 mg/dL (ref 0.3–1.2)
Total Protein: 6.9 g/dL (ref 6.5–8.1)

## 2021-12-02 LAB — CBC WITH DIFFERENTIAL/PLATELET
Abs Immature Granulocytes: 0.02 10*3/uL (ref 0.00–0.07)
Basophils Absolute: 0 10*3/uL (ref 0.0–0.1)
Basophils Relative: 1 %
Eosinophils Absolute: 0 10*3/uL (ref 0.0–0.5)
Eosinophils Relative: 0 %
HCT: 36.2 % (ref 36.0–46.0)
Hemoglobin: 11.8 g/dL — ABNORMAL LOW (ref 12.0–15.0)
Immature Granulocytes: 0 %
Lymphocytes Relative: 23 %
Lymphs Abs: 1.4 10*3/uL (ref 0.7–4.0)
MCH: 29.1 pg (ref 26.0–34.0)
MCHC: 32.6 g/dL (ref 30.0–36.0)
MCV: 89.4 fL (ref 80.0–100.0)
Monocytes Absolute: 0.5 10*3/uL (ref 0.1–1.0)
Monocytes Relative: 9 %
Neutro Abs: 3.9 10*3/uL (ref 1.7–7.7)
Neutrophils Relative %: 67 %
Platelets: 262 10*3/uL (ref 150–400)
RBC: 4.05 MIL/uL (ref 3.87–5.11)
RDW: 16.4 % — ABNORMAL HIGH (ref 11.5–15.5)
WBC: 5.8 10*3/uL (ref 4.0–10.5)
nRBC: 0 % (ref 0.0–0.2)

## 2021-12-02 LAB — URINALYSIS, ROUTINE W REFLEX MICROSCOPIC
Bacteria, UA: NONE SEEN
Bilirubin Urine: NEGATIVE
Glucose, UA: NEGATIVE mg/dL
Ketones, ur: 5 mg/dL — AB
Nitrite: NEGATIVE
Protein, ur: 30 mg/dL — AB
Specific Gravity, Urine: 1.019 (ref 1.005–1.030)
pH: 8 (ref 5.0–8.0)

## 2021-12-02 LAB — PROTIME-INR
INR: 1.5 — ABNORMAL HIGH (ref 0.8–1.2)
Prothrombin Time: 18.1 seconds — ABNORMAL HIGH (ref 11.4–15.2)

## 2021-12-02 LAB — LACTIC ACID, PLASMA
Lactic Acid, Venous: 1.4 mmol/L (ref 0.5–1.9)
Lactic Acid, Venous: 1.3 mmol/L (ref 0.5–1.9)

## 2021-12-02 LAB — URINE DRUG SCREEN, QUALITATIVE (ARMC ONLY)
Amphetamines, Ur Screen: NOT DETECTED
Barbiturates, Ur Screen: NOT DETECTED
Benzodiazepine, Ur Scrn: NOT DETECTED
Cannabinoid 50 Ng, Ur ~~LOC~~: NOT DETECTED
Cocaine Metabolite,Ur ~~LOC~~: NOT DETECTED
MDMA (Ecstasy)Ur Screen: NOT DETECTED
Methadone Scn, Ur: NOT DETECTED
Opiate, Ur Screen: NOT DETECTED
Phencyclidine (PCP) Ur S: NOT DETECTED
Tricyclic, Ur Screen: NOT DETECTED

## 2021-12-02 LAB — APTT: aPTT: 34 seconds (ref 24–36)

## 2021-12-02 LAB — RESP PANEL BY RT-PCR (FLU A&B, COVID) ARPGX2
Influenza A by PCR: NEGATIVE
Influenza B by PCR: NEGATIVE
SARS Coronavirus 2 by RT PCR: NEGATIVE

## 2021-12-02 LAB — VITAMIN B12: Vitamin B-12: 235 pg/mL (ref 180–914)

## 2021-12-02 LAB — ETHANOL: Alcohol, Ethyl (B): <10 mg/dL (ref <10)

## 2021-12-02 LAB — TROPONIN I (HIGH SENSITIVITY)
Troponin I (High Sensitivity): 10 ng/L (ref <18)
Troponin I (High Sensitivity): 37 ng/L — ABNORMAL HIGH (ref <18)

## 2021-12-02 LAB — TSH: TSH: 0.512 u[IU]/mL (ref 0.350–4.500)

## 2021-12-02 MED ORDER — SODIUM CHLORIDE 0.9 % IV SOLN
2.0000 g | Freq: Once | INTRAVENOUS | Status: AC
  Administered 2021-12-02: 2 g via INTRAVENOUS
  Filled 2021-12-02: qty 20

## 2021-12-02 MED ORDER — LABETALOL HCL 5 MG/ML IV SOLN: 5.0000 mg | INTRAVENOUS | Status: AC | PRN

## 2021-12-02 MED ORDER — AMLODIPINE BESYLATE 5 MG PO TABS
2.5000 mg | ORAL_TABLET | Freq: Every day | ORAL | Status: DC
  Filled 2021-12-02: qty 1

## 2021-12-02 MED ORDER — HYDROCODONE-ACETAMINOPHEN 5-325 MG PO TABS: 1.0000 | ORAL_TABLET | Freq: Once | ORAL | Status: DC

## 2021-12-02 MED ORDER — LEVOTHYROXINE SODIUM 50 MCG PO TABS
50.0000 ug | ORAL_TABLET | Freq: Every day | ORAL | Status: DC
  Administered 2021-12-03 – 2021-12-06 (×4): 50 ug via ORAL
  Filled 2021-12-02 (×5): qty 1

## 2021-12-02 MED ORDER — HEPARIN (PORCINE) 25000 UT/250ML-% IV SOLN
700.0000 [IU]/h | INTRAVENOUS | Status: DC
  Administered 2021-12-02: 20:00:00 800 [IU]/h via INTRAVENOUS
  Filled 2021-12-02: qty 250

## 2021-12-02 MED ORDER — HEPARIN BOLUS VIA INFUSION
4000.0000 [IU] | Freq: Once | INTRAVENOUS | Status: AC
  Administered 2021-12-02: 4000 [IU] via INTRAVENOUS
  Filled 2021-12-02: qty 4000

## 2021-12-02 MED ORDER — STROKE: EARLY STAGES OF RECOVERY BOOK: Freq: Once | Status: DC

## 2021-12-02 MED ORDER — ACETAMINOPHEN 325 MG RE SUPP: 650.0000 mg | Freq: Four times a day (QID) | RECTAL | Status: DC | PRN

## 2021-12-02 MED ORDER — ACETAMINOPHEN 500 MG PO TABS
1000.0000 mg | ORAL_TABLET | Freq: Once | ORAL | Status: DC
  Filled 2021-12-02: qty 2

## 2021-12-02 MED ORDER — SENNOSIDES-DOCUSATE SODIUM 8.6-50 MG PO TABS
1.0000 | ORAL_TABLET | Freq: Every evening | ORAL | Status: DC | PRN
  Administered 2021-12-05: 1 via ORAL
  Filled 2021-12-02: qty 1

## 2021-12-02 MED ORDER — IOHEXOL 350 MG/ML SOLN
75.0000 mL | Freq: Once | INTRAVENOUS | Status: AC | PRN
Start: 2021-12-02 — End: 2021-12-02
  Administered 2021-12-02: 75 mL via INTRAVENOUS

## 2021-12-02 MED ORDER — METOPROLOL TARTRATE 25 MG PO TABS
25.0000 mg | ORAL_TABLET | Freq: Two times a day (BID) | ORAL | Status: DC
  Administered 2021-12-04 – 2021-12-06 (×5): 25 mg via ORAL
  Filled 2021-12-02 (×6): qty 1

## 2021-12-02 MED ORDER — APIXABAN 5 MG PO TABS: 5.0000 mg | ORAL_TABLET | Freq: Two times a day (BID) | ORAL | Status: DC

## 2021-12-02 MED ORDER — LORAZEPAM 2 MG/ML IJ SOLN
1.0000 mg | Freq: Once | INTRAMUSCULAR | Status: AC
  Administered 2021-12-02: 1 mg via INTRAVENOUS
  Filled 2021-12-02: qty 1

## 2021-12-02 MED ORDER — HYDRALAZINE HCL 20 MG/ML IJ SOLN: 2.0000 mg | INTRAMUSCULAR | Status: AC | PRN

## 2021-12-02 MED ORDER — MELATONIN 5 MG PO TABS
5.0000 mg | ORAL_TABLET | Freq: Every evening | ORAL | Status: DC | PRN
  Administered 2021-12-04 – 2021-12-06 (×2): 5 mg via ORAL
  Filled 2021-12-02 (×4): qty 1

## 2021-12-02 MED ORDER — ATORVASTATIN CALCIUM 20 MG PO TABS
40.0000 mg | ORAL_TABLET | Freq: Every day | ORAL | Status: DC
  Administered 2021-12-03 – 2021-12-06 (×4): 40 mg via ORAL
  Filled 2021-12-02 (×4): qty 2

## 2021-12-02 MED ORDER — ACETAMINOPHEN 650 MG RE SUPP: 650.0000 mg | RECTAL | Status: DC | PRN

## 2021-12-02 MED ORDER — PANTOPRAZOLE SODIUM 40 MG PO TBEC
40.0000 mg | DELAYED_RELEASE_TABLET | Freq: Every day | ORAL | Status: DC
  Administered 2021-12-03 – 2021-12-04 (×2): 40 mg via ORAL
  Filled 2021-12-02 (×2): qty 1

## 2021-12-02 MED ORDER — ACETAMINOPHEN 325 MG PO TABS
650.0000 mg | ORAL_TABLET | ORAL | Status: DC | PRN
  Administered 2021-12-03 – 2021-12-04 (×2): 650 mg via ORAL
  Filled 2021-12-02 (×2): qty 2

## 2021-12-02 MED ORDER — ACETAMINOPHEN 160 MG/5ML PO SOLN
650.0000 mg | ORAL | Status: DC | PRN
  Filled 2021-12-02: qty 20.3

## 2021-12-02 NOTE — Consult Note (Signed)
Neurology Consult H&P  JAKERRA FLOYD MR# [id number] 12/02/2021   CC: code stroke  History is obtained from: patient and chart.  HPI: Erin Sutton is a 81 y.o. female PMHx as reviewed below, bilateral PEs and a DVT (apixaban) presented to ED due to nausea and headache. She stated that she developed a severe frontal headache which was pressure like with associated nausea. She did not vomit. At baseline she is diffusely weak. On arrival to ED She was noted to be slumped over and code stroke was activated.  SBP 200s  Currently on abx for a UTI.   LKW: unclear tNK given: Not a candidate - apixaban IR Thrombectomy No lvo Modified Rankin Scale: 0-Completely asymptomatic and back to baseline post- stroke NIHSS: 9  ROS: A complete ROS was performed and is negative except as noted in the HPI.   Past Medical History:  Diagnosis Date   Anxiety    Arthritis    Chronic kidney disease    Constipation    GERD (gastroesophageal reflux disease)    Hypertension    Hypothyroidism    Osteoporosis    Sleep apnea    Urinary incontinence      Family History  Problem Relation Age of Onset   Hypertension Mother    Diabetes Mother    Heart attack Mother    Asthma Mother    Peripheral vascular disease Mother    Heart attack Father    Hypertension Father    Diabetes Father    Colon cancer Maternal Aunt    Breast cancer Cousin     Social History:  reports that she has never smoked. She has never used smokeless tobacco. She reports that she does not drink alcohol and does not use drugs.   Prior to Admission medications   Medication Sig Start Date End Date Taking? Authorizing Provider  acetaminophen (TYLENOL) 500 MG tablet Take 2 tablets (1,000 mg total) by mouth 3 (three) times daily. 08/14/21   Danford, Suann Larry, MD  amLODipine (NORVASC) 2.5 MG tablet Take 1 tablet (2.5 mg total) by mouth daily as needed (for SBP>180). 11/20/21 11/20/22  Merlyn Lot, MD  apixaban (ELIQUIS)  5 MG TABS tablet Take 1 tablet (5 mg total) by mouth 2 (two) times daily. 11/14/21   Barb Merino, MD  atorvastatin (LIPITOR) 40 MG tablet Take 40 mg by mouth daily.    [provider]  cyanocobalamin (,VITAMIN B-12,) 1000 MCG/ML injection Inject 1,000 mcg into the muscle See admin instructions. Inject 1026mcg once weekly for 4 weeks then recheck by Dr Wynetta Emery 05/06/21   [provider]  diclofenac Sodium (VOLTAREN) 1 % GEL Apply 1 application topically daily. 11/08/21   [provider]  levothyroxine (SYNTHROID, LEVOTHROID) 50 MCG tablet Take 50 mcg by mouth daily before breakfast.    [provider]  LORazepam (ATIVAN) 0.5 MG tablet Take 0.5 mg by mouth every 8 (eight) hours as needed for anxiety or sleep.    [provider]  melatonin 3 MG TABS tablet Take 1 tablet by mouth at bedtime as needed. 11/08/21   [provider]  metoprolol tartrate (LOPRESSOR) 25 MG tablet Take 1 tablet (25 mg total) by mouth in the morning and at bedtime. 11/14/21 02/12/22  Barb Merino, MD  neomycin-polymyxin b-dexamethasone (MAXITROL) 5.0-53976-7.3 OINT 1 application in the morning and at bedtime. (Affected eye/eyes)    [provider]  nitrofurantoin, macrocrystal-monohydrate, (MACROBID) 100 MG capsule Take 1 capsule (100 mg total) by mouth 2 (two)  times daily. 11/24/21   Margarette Canada, NP  oxyCODONE (OXY IR/ROXICODONE) 5 MG immediate release tablet Take 5 mg by mouth every 6 (six) hours as needed for severe pain.    [provider]  pantoprazole (PROTONIX) 40 MG tablet Take 40 mg by mouth daily.    [provider]  phenazopyridine (PYRIDIUM) 200 MG tablet Take 1 tablet (200 mg total) by mouth 3 (three) times daily. 11/24/21   Margarette Canada, NP  Polyethyl Glycol-Propyl Glycol (SYSTANE OP) Place 1 drop into both eyes 2 (two) times daily.    [provider]  polyethylene glycol powder (GLYCOLAX/MIRALAX) 17 GM/SCOOP powder Take 17 g by  mouth daily. 08/03/20   [provider]  senna-docusate (SENOKOT-S) 8.6-50 MG tablet Take 2 tablets by mouth 2 (two) times daily as needed for mild constipation. 04/15/20   Sharen Hones, MD  spironolactone (ALDACTONE) 25 MG tablet Take 1 tablet (25 mg total) by mouth daily. 11/14/21 02/12/22  Barb Merino, MD  thiamine 100 MG tablet Take 1 tablet by mouth daily. 11/08/21   [provider]  Zinc Oxide 40 % PSTE Apply 1 application topically 2 (two) times daily as needed. 11/08/21   [provider]    Exam: Current vital signs: BP (!) 154/99 (BP Location: Left Arm)    Pulse 86    Temp 98.8 F (37.1 C) (Oral)    Resp 18    Ht 5\' 3"  (1.6 m)    Wt 65.3 kg    SpO2 98%    BMI 25.50 kg/m   Physical Exam  Constitutional: Appears well-developed and well-nourished.  Psych: Affect appropriate to situation Eyes: No scleral injection HENT: No OP obstruction. Head: Normocephalic.  Cardiovascular: Normal rate and regular rhythm.  Respiratory: Effort normal, symmetric excursions bilaterally, no audible wheezing. GI: Soft.  No distension. There is no tenderness.  Skin: WDI  Neuro: Mental Status: Patient is awake, alert, oriented to person, place, month, year, and situation. Patient is able to give a clear and coherent history. Speech  fluent, intact comprehension and repetition. No signs of aphasia or neglect. Visual Fields are full. Pupils are equal, round, and reactive to light. EOMI without ptosis or diploplia.  Facial sensation is symmetric to temperature Facial movement is asymmetric on left however looks more habitual.  Hearing is intact to voice. Uvula midline and palate elevates symmetrically. Shoulder shrug is symmetric. Tongue is midline without atrophy or fasciculations.  Tone is normal. Bulk is normal. Diffusely weak ~4/5 in all four extremities. Sensation is decreased to temperature in left arm. Deep Tendon Reflexes: 2+ and symmetric in the biceps and  patellae. Toes are downgoing bilaterally. FNF and HKS are intact bilaterally with encouragement. Gait - Deferred  I have reviewed labs in epic and the pertinent results are: No labs available at the time of assessment  I have reviewed the images obtained: NCT head showed No acute intracranial process, ASPECTS 10. CTA head and neck showed no large vessel occlusion, proximal M2 stenosis otherwise no significant flow limiting stenoses.   Assessment: LADORIS LYTHGOE is a 81 y.o. female PMHx as noted above, bilateral PE and LLE DVT (apixaban), baseline generalized weakness with headache and nausea. Exam was fluctuating and difficult to assess however more likely to be hypertensive encephalopathy.   Plan: - MRI brain without contrast. - Gradually reduce MAP: ~10-20% the first hour and by 5-15% over the next 23 hours.  Target blood pressure of <180/<181mmHg for the first hour. Then <160/<116mmHg for the  next 23 hours. - Telemetry monitoring.  This patient is critically ill and at significant risk of neurological worsening, death and care requires constant monitoring of vital signs, hemodynamics,respiratory and cardiac monitoring, neurological assessment, discussion with family, other specialists and medical decision making of high complexity. I spent 71 minutes of neurocritical care time  in the care of  this patient. This was time spent independent of any time provided by nurse practitioner or PA.  Electronically signed by:  Lynnae Sandhoff, MD Page: [phone] 12/02/2021, 3:56 PM  If 7pm- 7am, please page neurology on call as listed in [place].

## 2021-12-02 NOTE — Progress Notes (Signed)
°   12/02/21 1525  Clinical Encounter Type  Visited With Patient not available  Visit Type Code  Spiritual Encounters  Spiritual Needs Other (Comment) (not assessed)   Chaplain Mckinsley Koelzer responded to a code stroke and patient was in scans at the time. I checked in with nurse and checked for family. Not assessed will follow up.

## 2021-12-02 NOTE — H&P (Addendum)
History and Physical   Erin Sutton JSE:[id number] DOB: [birth year] DOA: 12/02/2021  PCP: Baxter Hire, MD  Outpatient Specialists: Dr. Stevphen Meuse clinic cardiology Patient coming from: Home via EMS  I have personally briefly reviewed patient's old medical records in [place].  Chief Concern: Chest pressure  HPI: Erin Sutton is a 81 y.o. female with medical history significant for atrial fibrillation on anticoagulation, on Eliquis, hypertension, hyperlipidemia, dementia, generalized anxiety disorder, insomnia, GERD, who presents emergency department for chief concern of weakness and fatigue.  She reports that she presented to the ED for chief concern of chest pressure. She reports she has been experiencing chest pressure for the last few months. She reports that most recently her chest pressure comes and goes and is in the epigastric region. She reports that the chest pressure comes on when she is resting. She reports she does not know what makes it go away. She reports the chest pressure lasts minutes.   She denies cough, fever, syncope, swelling in her legs. She denies dysuria, vomiting. She endorses nausea.   She endorses that she has bleeding from her virgina with wiping. She reprots she was told that she needs to see a gynocologist.   Social history: She lives at home by herself.  Patient is a widow for at least 12 years.  She denies history of tobacco, EtOH, recreational drug use.  She is retired and formerly worked for [employer] for 20 years.  Vaccination history: Patient is vaccinated for COVID-19 with at least 3 doses of Moderna  ROS: Constitutional: no weight change, no fever ENT/Mouth: no sore throat, no rhinorrhea Eyes: no eye pain, no vision changes Cardiovascular: + chest pain, + dyspnea,  no edema, no palpitations Respiratory: + cough, no sputum, no wheezing Gastrointestinal: + nausea, no vomiting, no diarrhea, no constipation Genitourinary: no  urinary incontinence, no dysuria, no hematuria Musculoskeletal: no arthralgias, no myalgias Skin: no skin lesions, no pruritus, Neuro: + weakness, no loss of consciousness, no syncope Psych: no anxiety, no depression, + decrease appetite Heme/Lymph: no bruising, no bleeding  ED Course: Discussed with emergency medicine provider, patient requiring hospitalization for chief concerns of TIA/stroke work-up.  Vitals in the emergency department showed temperature of 98.8, respiration rate of 25, heart rate of 81, initial blood pressure 192/81 and improved to 162/83, SPO2 of 96% on room air.  Serum sodium 132, potassium 4.0, chloride 98, bicarb 25, BUN of 11, serum creatinine of 0.77, nonfasting blood glucose 102, GFR greater than 60, WBC 5.8, hemoglobin 11.8, platelets of 262.  Lactic acid was not elevated at 1.3.  INR was 1.5, PT of 18.1.  Blood cultures and urine culture have been collected and are in process.  Assessment/Plan  Principal Problem:   TIA (transient ischemic attack) Active Problems:   Confusion   Hypothyroidism   HTN (hypertension)   GERD (gastroesophageal reflux disease)   Anxiety   Abnormal gait   Hyperlipidemia   Sleep apnea   Dementia (HCC)   Generalized anxiety disorder   Paroxysmal atrial fibrillation (HCC)   Chronic anticoagulation - on eliquis for afib   Elevated troponin   # TIA/Stroke-like symptoms - Neurology has been consulted and we appreciate further recommendations - Complete echo ordered - MRI of the brain ordered - Fasting lipid and A1c ordered, B12, TSH - Permissive hypertension per neurology recommendations - Frequent neuro vascular checks - N.p.o. pending swallow study - PT, OT, SLP - Fall precaution and aspiration precaution  # Elevated troponin-presumed  secondary to elevated blood pressure # NSTEMI - No ST-T wave EKG changes - Complete echo ordered - Heparin gtt ordered - Cardiology, [facility] clinic, Dr. Corky Sox has been consulted via  secure chat and states that the patient will be seen  # Hypertension-labetalol 5 mg IV every 2 hours as needed for SBP greater than 180, 13 hours ordered; hydralazine injection 2 mg IV every 4 hours as needed for SBP greater than 180 starting on 12/03/2021 at 7 AM  # Hypothyroid-Home dose of levothyroxine 50 mcg daily resumed  # Generalized anxiety disorder # History of panic attacks  # History of chronic heart failure with preserved ejection fraction/grade 1 diastolic dysfunction - Moderate LVH - Last complete echo  # Atrial fibrillation on anticoagulation-resumed home Eliquis 5 mg p.o. twice daily  # Hyperlipidemia-atorvastatin 40 mg daily resumed  # Insomnia-melatonin 5 mg nightly as needed for sleep resumed  # Vaginal bleeding - I advised patient that she will need to be evaluated outpatient by gynecologist; patient understands she needs to see a gynocologist   # Pending med reconciliation completion  Chart reviewed.   DVT prophylaxis: Heparin GTT Code Status: full code Diet: N.p.o., pending speech and or nursing swallow evaluation Family Communication: Attempted to call patient's daughter at patient's request at [phone], no pickup, left voicemail Disposition Plan: Pending clinical course Consults called: Neurology Admission status: PCU, observation  Past Medical History:  Diagnosis Date   Anxiety    Arthritis    Chronic kidney disease    Constipation    GERD (gastroesophageal reflux disease)    History of DVT (deep vein thrombosis) 07/27/2019   History of pulmonary embolism 07/14/2019   Bilateral   Hypertension    Hypothyroidism    Osteoporosis    Sleep apnea    Urinary incontinence    Past Surgical History:  Procedure Laterality Date   BREAST BIOPSY Right    neg/stereo   BREAST BIOPSY Left    neg/stereo   BREAST EXCISIONAL BIOPSY Right    neg   CATARACT EXTRACTION, BILATERAL     COLONOSCOPY WITH PROPOFOL N/A 07/02/2015   Procedure: COLONOSCOPY WITH  PROPOFOL;  Surgeon: Manya Silvas, MD;  Location: [place];  Service: Endoscopy;  Laterality: N/A;   CORRECTION HAMMER TOE Right    ESOPHAGOGASTRODUODENOSCOPY (EGD) WITH PROPOFOL N/A 07/02/2015   Procedure: ESOPHAGOGASTRODUODENOSCOPY (EGD) WITH PROPOFOL;  Surgeon: Manya Silvas, MD;  Location: [facility] ENDOSCOPY;  Service: Endoscopy;  Laterality: N/A;   JOINT REPLACEMENT Left    knee   REPLACEMENT TOTAL KNEE Left    SKIN BIOPSY Right    Social History:  reports that she has never smoked. She has never used smokeless tobacco. She reports that she does not drink alcohol and does not use drugs.  Allergies  Allergen Reactions   Ace Inhibitors Cough   Levofloxacin    Penicillin G Other (See Comments)   Tape Other (See Comments)    Other reaction(s): Other (See Comments)   Amoxicillin Rash    Has patient had a PCN reaction causing immediate rash, facial/tongue/throat swelling, SOB or lightheadedness with hypotension: No Has patient had a PCN reaction causing severe rash involving mucus membranes or skin necrosis: No Has patient had a PCN reaction that required hospitalization: No Has patient had a PCN reaction occurring within the last 10 years: No If all of the above answers are "NO", then may proceed with Cephalosporin use.    Benicar [Olmesartan] Rash    Itching rash   Codeine  Rash   Codeine Sulfate Rash   Levaquin [Levofloxacin In D5w] Rash    Alters mental status   Penicillin V Potassium Rash    Has patient had a PCN reaction causing immediate rash, facial/tongue/throat swelling, SOB or lightheadedness with hypotension: No Has patient had a PCN reaction causing severe rash involving mucus membranes or skin necrosis: No Has patient had a PCN reaction that required hospitalization: No Has patient had a PCN reaction occurring within the last 10 years: No If all of the above answers are "NO", then may proceed with Cephalosporin use.    Sulfa Antibiotics Rash   Family History   Problem Relation Age of Onset   Hypertension Mother    Diabetes Mother    Heart attack Mother    Asthma Mother    Peripheral vascular disease Mother    Heart attack Father    Hypertension Father    Diabetes Father    Colon cancer Maternal Aunt    Breast cancer Cousin    Family history: Family history reviewed and pertinent for history of hypertension and heart attack in mother.  Prior to Admission medications   Medication Sig Start Date End Date Taking? Authorizing Provider  acetaminophen (TYLENOL) 500 MG tablet Take 2 tablets (1,000 mg total) by mouth 3 (three) times daily. 08/14/21  Yes Danford, Suann Larry, MD  amLODipine (NORVASC) 2.5 MG tablet Take 1 tablet (2.5 mg total) by mouth daily as needed (for SBP>180). 11/20/21 11/20/22 Yes Merlyn Lot, MD  apixaban (ELIQUIS) 5 MG TABS tablet Take 1 tablet (5 mg total) by mouth 2 (two) times daily. 11/14/21  Yes Barb Merino, MD  atorvastatin (LIPITOR) 40 MG tablet Take 40 mg by mouth daily.   Yes [provider]  cefUROXime (CEFTIN) 250 MG tablet Take 250 mg by mouth in the morning and at bedtime. 11/27/21 12/04/21 Yes [provider]  HYDROcodone-acetaminophen (NORCO/VICODIN) 5-325 MG tablet Take 1 tablet by mouth every 4 (four) hours. 11/26/21  Yes [provider]  levothyroxine (SYNTHROID, LEVOTHROID) 50 MCG tablet Take 50 mcg by mouth daily before breakfast.   Yes [provider]  metoprolol tartrate (LOPRESSOR) 25 MG tablet Take 1 tablet (25 mg total) by mouth in the morning and at bedtime. 11/14/21 02/12/22 Yes Ghimire, Dante Gang, MD  neomycin-polymyxin b-dexamethasone (MAXITROL) 3.1-51761-6.0 OINT 1 application in the morning and at bedtime. (Affected eye/eyes)   Yes [provider]  ondansetron (ZOFRAN-ODT) 8 MG disintegrating tablet Take 8 mg by mouth every 8 (eight) hours as needed. 11/27/21 12/04/21 Yes [provider]  pantoprazole (PROTONIX) 40 MG tablet Take 40 mg by mouth  daily.   Yes [provider]  Polyethyl Glycol-Propyl Glycol (SYSTANE OP) Place 1 drop into both eyes 2 (two) times daily.   Yes [provider]  spironolactone (ALDACTONE) 25 MG tablet Take 1 tablet (25 mg total) by mouth daily. 11/14/21 02/12/22 Yes Ghimire, Dante Gang, MD  cyanocobalamin (,VITAMIN B-12,) 1000 MCG/ML injection Inject 1,000 mcg into the muscle See admin instructions. Inject 1048mcg once weekly for 4 weeks then recheck by Dr Wynetta Emery Patient not taking: Reported on 12/02/2021 05/06/21   [provider]  diclofenac Sodium (VOLTAREN) 1 % GEL Apply 1 application topically daily. Patient not taking: Reported on 12/02/2021 11/08/21   [provider]  LORazepam (ATIVAN) 0.5 MG tablet Take 0.5 mg by mouth every 8 (eight) hours as needed for anxiety or sleep.    [provider]  melatonin 3 MG TABS tablet Take 1 tablet by  mouth at bedtime as needed. 11/08/21   [provider]  nitrofurantoin, macrocrystal-monohydrate, (MACROBID) 100 MG capsule Take 1 capsule (100 mg total) by mouth 2 (two) times daily. 11/24/21   Margarette Canada, NP  oxyCODONE (OXY IR/ROXICODONE) 5 MG immediate release tablet Take 5 mg by mouth every 6 (six) hours as needed for severe pain. Patient not taking: Reported on 12/02/2021    [provider]  phenazopyridine (PYRIDIUM) 200 MG tablet Take 1 tablet (200 mg total) by mouth 3 (three) times daily. 11/24/21   Margarette Canada, NP  polyethylene glycol powder (GLYCOLAX/MIRALAX) 17 GM/SCOOP powder Take 17 g by mouth daily. 08/03/20   [provider]  senna-docusate (SENOKOT-S) 8.6-50 MG tablet Take 2 tablets by mouth 2 (two) times daily as needed for mild constipation. 04/15/20   Sharen Hones, MD  thiamine 100 MG tablet Take 1 tablet by mouth daily. Patient not taking: Reported on 12/02/2021 11/08/21   [provider]  Zinc Oxide 40 % PSTE Apply 1 application topically 2 (two) times daily as needed. Patient not taking:  Reported on 12/02/2021 11/08/21   [provider]   Physical Exam: Vitals:   12/02/21 1605 12/02/21 1645 12/02/21 1700 12/02/21 1735  BP: (!) 200/95 (!) 178/79 (!) 187/87 (!) 162/83  Pulse: 81 85 84 82  Resp: (!) 24 (!) 21 18 13   Temp:      TempSrc:      SpO2: 96% 98% 100% 100%  Weight:      Height:       Constitutional: appears age-appropriate, frail, weak, NAD, calm, comfortable Eyes: PERRL, lids and conjunctivae normal ENMT: Mucous membranes are moist. Posterior pharynx clear of any exudate or lesions. Age-appropriate dentition. Hearing appropriate Neck: normal, supple, no masses, no thyromegaly Respiratory: clear to auscultation bilaterally, no wheezing, no crackles. Normal respiratory effort. No accessory muscle use.  Cardiovascular: Regular rate and rhythm, no murmurs / rubs / gallops. No extremity edema. 2+ pedal pulses. No carotid bruits.  Abdomen: no tenderness, no masses palpated, no hepatosplenomegaly. Bowel sounds positive.  Musculoskeletal: no clubbing / cyanosis. No joint deformity upper and lower extremities. Good ROM, no contractures, no atrophy. Normal muscle tone.  Skin: no rashes, lesions, ulcers. No induration Neurologic: Sensation intact. Strength 5/5 in all 4.  Psychiatric: Normal judgment and insight. Alert and oriented x 3. Normal mood.   EKG: independently reviewed, showing sinus rhythm with rate of 86, QTc 472, Right bundle branch block  Chest x-ray on Admission: I personally reviewed and I agree with radiologist reading as below.  DG Chest Port 1 View  Result Date: 12/02/2021 CLINICAL DATA:  Weakness, fatigue EXAM: PORTABLE CHEST 1 VIEW COMPARISON:  11/21/2021 FINDINGS: Stable cardiomediastinal contours. Aortic atherosclerosis. No focal airspace consolidation, pleural effusion, or pneumothorax. Degenerative changes of the shoulders, left worse than right. IMPRESSION: No active disease. Electronically Signed   By: Davina Poke D.O.   On: 12/02/2021  16:18   CT HEAD CODE STROKE WO CONTRAST  Result Date: 12/02/2021 CLINICAL DATA:  Code stroke.  Weakness and fatigue EXAM: CT HEAD WITHOUT CONTRAST TECHNIQUE: Contiguous axial images were obtained from the base of the skull through the vertex without intravenous contrast. COMPARISON:  11/21/2021. FINDINGS: Brain: No evidence of acute infarction, hemorrhage, cerebral edema, mass, mass effect, or midline shift. Ventricles and sulci are normal for age. No extra-axial fluid collection. Periventricular white matter changes, likely the sequela of chronic small vessel ischemic disease. Basal ganglia calcifications. Vascular: No hyperdense vessel or unexpected calcification. Skull: Normal.  Negative for fracture or focal lesion. Sinuses/Orbits: No acute finding. Other: The mastoid air cells are well aerated. ASPECTS [facility] Stroke Program Early CT Score) - Ganglionic level infarction (caudate, lentiform nuclei, internal capsule, insula, M1-M3 cortex): 7 - Supraganglionic infarction (M4-M6 cortex): 3 Total score (0-10 with 10 being normal): 10 IMPRESSION: 1. No acute intracranial process. 2. ASPECTS is 10. Code stroke imaging results were communicated on 12/02/2021 at 3:40 pm to provider Dr. Theda Sers via secure text paging. Electronically Signed   By: Merilyn Baba M.D.   On: 12/02/2021 15:41   CT ANGIO HEAD NECK W WO CM (CODE STROKE)  Result Date: 12/02/2021 CLINICAL DATA:  Weakness and fatigue EXAM: CT ANGIOGRAPHY HEAD AND NECK TECHNIQUE: Multidetector CT imaging of the head and neck was performed using the standard protocol during bolus administration of intravenous contrast. Multiplanar CT image reconstructions and MIPs were obtained to evaluate the vascular anatomy. Carotid stenosis measurements (when applicable) are obtained utilizing NASCET criteria, using the distal internal carotid diameter as the denominator. CONTRAST:  31mL OMNIPAQUE IOHEXOL 350 MG/ML SOLN COMPARISON:  08/12/2021 CTA head neck, correlation is  made with same day CT head. FINDINGS: CT HEAD FINDINGS For noncontrast findings, please see same day CT head. CTA NECK FINDINGS Aortic arch: Standard branching. Imaged portion shows no evidence of aneurysm or dissection. No significant stenosis of the major arch vessel origins. Aortic atherosclerosis, which extends into the branch vessels. Right carotid system: Calcified plaque at the bifurcation and in the proximal right ICA, which causes less than 50% stenosis. No dissection or occlusion. Left carotid system: No dissection or occlusion. Minimal calcified plaque at bifurcation, without significant stenosis. Vertebral arteries: Codominant. No evidence of dissection, stenosis (50% or greater) or occlusion. Skeleton: Degenerative changes in the cervical spine. No acute osseous abnormality. Other neck: Prior left thyroidectomy.  Otherwise negative. Upper chest: Focal pulmonary opacity or pleural effusion. Review of the MIP images confirms the above findings CTA HEAD FINDINGS Anterior circulation: Both internal carotid arteries are patent to the termini, with moderate calcifications but without significant stenosis. A1 segments patent. Normal anterior communicating artery. Anterior cerebral arteries are patent to their distal aspects. No M1 stenosis or occlusion. Normal MCA bifurcations. Moderate stenosis again noted in a left M2 branch. Distal MCA branches perfused, although there is mildly more robust perfusion on the right compared to the left, without additional focal stenosis. Posterior circulation: Vertebral arteries patent to the vertebrobasilar junction without stenosis. Basilar patent to its distal aspect. Superior cerebellar arteries patent bilaterally. Bilateral P1 segments originate from the basilar artery, although the right posterior communicating artery is present. PCAs perfused to their distal aspects without stenosis. The left posterior communicating artery is not visualized. Venous sinuses: As  permitted by contrast timing, patent. Anatomic variants: None significant Review of the MIP images confirms the above findings IMPRESSION: 1.  No intracranial large vessel occlusion or significant stenosis. 2.  No hemodynamically significant stenosis in the neck. 3. 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 Code stroke imaging results were communicated on 12/02/2021 at 4:05 pm to provider Dr. Theda Sers via secure text paging. Electronically Signed   By: Merilyn Baba M.D.   On: 12/02/2021 16:05    Labs on Admission: I have personally reviewed following labs  CBC: Recent Labs  Lab 12/02/21 1551  WBC 5.8  NEUTROABS 3.9  HGB 11.8*  HCT 36.2  MCV 89.4  PLT 073   Basic Metabolic Panel: Recent Labs  Lab 12/02/21 1551  NA 132*  K 4.0  CL 98  CO2 25  GLUCOSE 102*  BUN 11  CREATININE 0.77  CALCIUM 9.7   GFR: Estimated Creatinine Clearance: 51 mL/min (by C-G formula based on SCr of 0.77 mg/dL).  Liver Function Tests: Recent Labs  Lab 12/02/21 1551  AST 18  ALT 10  ALKPHOS 48  BILITOT 0.6  PROT 6.9  ALBUMIN 3.8   Coagulation Profile: Recent Labs  Lab 12/02/21 1551  INR 1.5*   Urine analysis:    Component Value Date/Time   COLORURINE COLORLESS (A) 12/02/2021 1551   APPEARANCEUR CLEAR (A) 12/02/2021 1551   APPEARANCEUR Hazy 07/25/2014 1350   LABSPEC 1.019 12/02/2021 1551   LABSPEC 1.020 07/25/2014 1350   PHURINE 8.0 12/02/2021 1551   GLUCOSEU NEGATIVE 12/02/2021 1551   GLUCOSEU Negative 07/25/2014 1350   HGBUR SMALL (A) 12/02/2021 1551   BILIRUBINUR NEGATIVE 12/02/2021 1551   BILIRUBINUR Negative 07/25/2014 1350   KETONESUR 5 (A) 12/02/2021 1551   PROTEINUR 30 (A) 12/02/2021 1551   NITRITE NEGATIVE 12/02/2021 1551   LEUKOCYTESUR TRACE (A) 12/02/2021 1551   LEUKOCYTESUR 2+ 07/25/2014 1350   CRITICAL CARE Performed by: Briant Cedar Imagene Boss  Total critical care time: 35 minutes  Critical care time was exclusive of separately billable procedures and  treating other patients.  Critical care was necessary to treat or prevent imminent or life-threatening deterioration.  Critical care was time spent personally by me on the following activities: development of treatment plan with patient and/or surrogate as well as nursing, discussions with consultants, evaluation of patient's response to treatment, examination of patient, obtaining history from patient or surrogate, ordering and performing treatments and interventions, ordering and review of laboratory studies, ordering and review of radiographic studies, pulse oximetry and re-evaluation of patient's condition.  Dr. Tobie Poet [facility]  If 7PM-7AM, please contact overnight-coverage provider If 7AM-7PM, please contact day coverage provider [email or web address]  12/02/2021, 6:48 PM

## 2021-12-02 NOTE — Consult Note (Signed)
ANTICOAGULATION CONSULT NOTE - Initial Consult  Pharmacy Consult for Heparin Infusion Indication:  NSTEMI  Allergies  Allergen Reactions   Ace Inhibitors Cough   Levofloxacin    Penicillin G Other (See Comments)   Tape Other (See Comments)    Other reaction(s): Other (See Comments)   Amoxicillin Rash    Has patient had a PCN reaction causing immediate rash, facial/tongue/throat swelling, SOB or lightheadedness with hypotension: No Has patient had a PCN reaction causing severe rash involving mucus membranes or skin necrosis: No Has patient had a PCN reaction that required hospitalization: No Has patient had a PCN reaction occurring within the last 10 years: No If all of the above answers are "NO", then may proceed with Cephalosporin use.    Benicar [Olmesartan] Rash    Itching rash   Codeine Rash   Codeine Sulfate Rash   Levaquin [Levofloxacin In D5w] Rash    Alters mental status   Penicillin V Potassium Rash    Has patient had a PCN reaction causing immediate rash, facial/tongue/throat swelling, SOB or lightheadedness with hypotension: No Has patient had a PCN reaction causing severe rash involving mucus membranes or skin necrosis: No Has patient had a PCN reaction that required hospitalization: No Has patient had a PCN reaction occurring within the last 10 years: No If all of the above answers are "NO", then may proceed with Cephalosporin use.    Sulfa Antibiotics Rash    Patient Measurements: Height: 5\' 3"  (160 cm) Weight: 65.3 kg (143 lb 15.4 oz) IBW/kg (Calculated) : 52.4 Heparin Dosing Weight: 65.3 kg  Vital Signs: Temp: 98.8 F (37.1 C) (01/09 1509) Temp Source: Oral (01/09 1509) BP: 162/83 (01/09 1735) Pulse Rate: 82 (01/09 1735)  Labs: Recent Labs    12/02/21 1551 12/02/21 1713  HGB 11.8*  --   HCT 36.2  --   PLT 262  --   APTT 34  --   LABPROT 18.1*  --   INR 1.5*  --   CREATININE 0.77  --   TROPONINIHS 10 37*    Estimated Creatinine Clearance:  51 mL/min (by C-G formula based on SCr of 0.77 mg/dL).   Medical History: Past Medical History:  Diagnosis Date   Anxiety    Arthritis    Chronic kidney disease    Constipation    GERD (gastroesophageal reflux disease)    History of DVT (deep vein thrombosis) 07/27/2019   History of pulmonary embolism 07/14/2019   Bilateral   Hypertension    Hypothyroidism    Osteoporosis    Sleep apnea    Urinary incontinence     Medications:  Apixaban 5mg  BID PTA  Assessment: Pharmacy has been consulted to initiate heparin in 80yo patient with history of bilateral PE and DVT presenting to the ED d/t nausea and headache. Code stroke was called as patient was noted to be slumped over in room. Patient's last dose of Apixaban was at roughtly 0800 this morning(12/02/2021). Baseline labs: 34 sec, INR 1.5, Hgb 11.8, Plts 262  Goal of Therapy:  Heparin level 0.3-0.7 units/ml Monitor platelets by anticoagulation protocol: Yes   Plan:  --Give 4000 units bolus x 1 --Start heparin infusion at 800 units/hr --Check aPTT level in 8 hours and heparin level daily until both level correlate, then will transition to heparin dosing --Continue to monitor H&H and platelets  Devita Nies A Alyannah Sanks 12/02/2021,7:14 PM

## 2021-12-02 NOTE — ED Notes (Signed)
Called [facility] to initiate Code Stroke @ [phone] spoke to [facility] who will activate page

## 2021-12-02 NOTE — ED Provider Notes (Signed)
[facility] Provider Note    Event Date/Time   First MD Initiated Contact with Patient 12/02/21 1521     (approximate)   History   Nausea and Fatigue   HPI  Erin Sutton is a 81 y.o. female here with nausea and fatigue.  History is somewhat limited due to aphasia.  Per report, the patient initially presented with generalized weakness in the setting of recent UTI.  She has been on antibiotics and been seen here as well as her PCP visit.  She said ongoing nausea and vomiting which has been fairly worsening.  While in the waiting room, she became abruptly less responsive and developed a left facial droop and left-sided weakness.  Code stroke was activated.  On my assessment, patient is aphasic.  She states she feels weak in her left side, but is having difficulty forming words.  Shakes her head no to any pain currently.  No known recent falls.  She is on Eliquis for her A. fib and has been taking this per her report.  Remainder of history limited due to aphasia.     Physical Exam   Triage Vital Signs: ED Triage Vitals  Enc Vitals Group     BP 12/02/21 1509 (!) 154/99     Pulse Rate 12/02/21 1509 86     Resp 12/02/21 1509 18     Temp 12/02/21 1509 98.8 F (37.1 C)     Temp Source 12/02/21 1509 Oral     SpO2 12/02/21 1509 98 %     Weight 12/02/21 1510 143 lb 15.4 oz (65.3 kg)     Height 12/02/21 1510 5\' 3"  (1.6 m)     Head Circumference --      Peak Flow --      Pain Score 12/02/21 1510 9     Pain Loc --      Pain Edu? --      Excl. in [place]? --     Most recent vital signs: Vitals:   12/02/21 2115 12/02/21 2304  BP: 107/61 102/63  Pulse: 88 76  Resp: (!) 30 20  Temp:    SpO2: 95% 98%     General: Awake, no distress.  CV:  Good peripheral perfusion.  No murmurs. Resp:  Normal effort.  Normal work of breathing, no wheezes rales. Abd:  No distention.  No tenderness Other:  Left sided facial droop noted.  Speech slightly slurred and delayed  with aphasia.  Extraocular movements appear intact.  Strength 4 out of 5 on the left upper and lower extremities, 5 out of 5 throughout the right.  Unable to assess sensation.  Gait deferred.   ED Results / Procedures / Treatments   Labs (all labs ordered are listed, but only abnormal results are displayed) Labs Reviewed  COMPREHENSIVE METABOLIC PANEL - Abnormal; Notable for the following components:      Result Value   Sodium 132 (*)    Glucose, Bld 102 (*)    All other components within normal limits  CBC WITH DIFFERENTIAL/PLATELET - Abnormal; Notable for the following components:   Hemoglobin 11.8 (*)    RDW 16.4 (*)    All other components within normal limits  PROTIME-INR - Abnormal; Notable for the following components:   Prothrombin Time 18.1 (*)    INR 1.5 (*)    All other components within normal limits  URINALYSIS, ROUTINE W REFLEX MICROSCOPIC - Abnormal; Notable for the following components:   Color, Urine COLORLESS (*)  APPearance CLEAR (*)    Hgb urine dipstick SMALL (*)    Ketones, ur 5 (*)    Protein, ur 30 (*)    Leukocytes,Ua TRACE (*)    All other components within normal limits  TROPONIN I (HIGH SENSITIVITY) - Abnormal; Notable for the following components:   Troponin I (High Sensitivity) 37 (*)    All other components within normal limits  RESP PANEL BY RT-PCR (FLU A&B, COVID) ARPGX2  CULTURE, BLOOD (ROUTINE X 2)  CULTURE, BLOOD (ROUTINE X 2)  URINE CULTURE  LACTIC ACID, PLASMA  LACTIC ACID, PLASMA  APTT  URINE DRUG SCREEN, QUALITATIVE ([facility] ONLY)  ETHANOL  VITAMIN B12  TSH  CBC  DIFFERENTIAL  HEMOGLOBIN A1C  LIPID PANEL  APTT  HEPARIN LEVEL (UNFRACTIONATED)  CBG MONITORING, ED  TROPONIN I (HIGH SENSITIVITY)     EKG  Normal sinus rhythm, ventricular rate 86.  PR 163, QRS 123, QTc 472.  No acute ST elevations or depressions.  EKG evidence of acute ischemia or infarct.   RADIOLOGY Chest x-ray: I reviewed the images, no active disease CT  angio head/neck: Reviewed by me, no apparent large intracranial vessel occlusion or evidence of stroke, chronic stenosis of left M2.  Reviewed and agree with radiology. CT head: Reviewed by me, no acute process.  Agree with radiology.     PROCEDURES:  Critical Care performed: Yes, see critical care procedure note(s)  .Critical Care Performed by: Duffy Bruce, MD Authorized by: Duffy Bruce, MD   Critical care provider statement:    Critical care time (minutes):  30   Critical care time was exclusive of:  Separately billable procedures and treating other patients   Critical care was necessary to treat or prevent imminent or life-threatening deterioration of the following conditions:  Cardiac failure, circulatory failure, respiratory failure and CNS failure or compromise   Critical care was time spent personally by me on the following activities:  Development of treatment plan with patient or surrogate, discussions with consultants, evaluation of patient's response to treatment, examination of patient, ordering and review of laboratory studies, ordering and review of radiographic studies, ordering and performing treatments and interventions, pulse oximetry, re-evaluation of patient's condition and review of old charts .1-3 Lead EKG Interpretation Performed by: Duffy Bruce, MD Authorized by: Duffy Bruce, MD     Interpretation: normal     ECG rate:  70-90   ECG rate assessment: normal     Rhythm: sinus rhythm     Ectopy: none     Conduction: normal   Comments:     Indication: Weakness    MEDICATIONS ORDERED IN ED: Medications  acetaminophen (TYLENOL) tablet 1,000 mg (1,000 mg Oral Not Given 12/02/21 1722)  melatonin tablet 5 mg (has no administration in time range)  atorvastatin (LIPITOR) tablet 40 mg (0 mg Oral Hold 12/02/21 1843)  levothyroxine (SYNTHROID) tablet 50 mcg (has no administration in time range)  pantoprazole (PROTONIX) EC tablet 40 mg (has no  administration in time range)   stroke: mapping our early stages of recovery book (0 each Does not apply Hold 12/02/21 1843)  acetaminophen (TYLENOL) tablet 650 mg (has no administration in time range)    Or  acetaminophen (TYLENOL) 160 MG/5ML solution 650 mg (has no administration in time range)    Or  acetaminophen (TYLENOL) suppository 650 mg (has no administration in time range)  senna-docusate (Senokot-S) tablet 1 tablet (has no administration in time range)  labetalol (NORMODYNE) injection 5 mg (has no administration in time  range)  hydrALAZINE (APRESOLINE) injection 2 mg (has no administration in time range)  amLODipine (NORVASC) tablet 2.5 mg (has no administration in time range)  metoprolol tartrate (LOPRESSOR) tablet 25 mg (0 mg Oral Hold 12/02/21 2258)  heparin ADULT infusion 100 units/mL (25000 units/235mL) (800 Units/hr Intravenous Rate/Dose Verify 12/02/21 2235)  HYDROcodone-acetaminophen (NORCO/VICODIN) 5-325 MG per tablet 1 tablet (has no administration in time range)  iohexol (OMNIPAQUE) 350 MG/ML injection 75 mL (75 mLs Intravenous Contrast Given 12/02/21 1538)  cefTRIAXone (ROCEPHIN) 2 g in sodium chloride 0.9 % 100 mL IVPB (0 g Intravenous Stopped 12/02/21 1801)  heparin bolus via infusion 4,000 Units (4,000 Units Intravenous Bolus from Bag 12/02/21 2000)  LORazepam (ATIVAN) injection 1 mg (1 mg Intravenous Given 12/02/21 2054)     IMPRESSION / MDM / ASSESSMENT AND PLAN / ED COURSE  I reviewed the triage vital signs and the nursing notes.                               The patient is on the cardiac monitor to evaluate for evidence of arrhythmia and/or significant heart rate changes.   Ddx: Ischemic stroke, hypertensive encephalopathy, encephalopathy 2/2 UTI, focal seizure, brain mass/lesion  81 year old female here with generalized weakness, with focal weakness onset in the waiting room.  Patient activated as a code stroke for her new, focal weakness.  CT head and angio obtained,  reviewed, with stat neurology consult.  Patient has no obvious large vessel occlusion.  She is on Eliquis outside of the tPA window.  Otherwise, I suspect encephalopathy due to recent UTI.  Urinalysis today seems to be improving.  She has no significant leukocytosis on CBC.  CMP shows normal renal function.  Lactic acid normal, no evidence of sepsis.  UDS negative.  Chest x-ray clear.  Will admit for stroke evaluation and work-up, further management of her UTI and weakness.  MEDICATIONS GIVEN IN ED: Medications  acetaminophen (TYLENOL) tablet 1,000 mg (1,000 mg Oral Not Given 12/02/21 1722)  melatonin tablet 5 mg (has no administration in time range)  atorvastatin (LIPITOR) tablet 40 mg (0 mg Oral Hold 12/02/21 1843)  levothyroxine (SYNTHROID) tablet 50 mcg (has no administration in time range)  pantoprazole (PROTONIX) EC tablet 40 mg (has no administration in time range)   stroke: mapping our early stages of recovery book (0 each Does not apply Hold 12/02/21 1843)  acetaminophen (TYLENOL) tablet 650 mg (has no administration in time range)    Or  acetaminophen (TYLENOL) 160 MG/5ML solution 650 mg (has no administration in time range)    Or  acetaminophen (TYLENOL) suppository 650 mg (has no administration in time range)  senna-docusate (Senokot-S) tablet 1 tablet (has no administration in time range)  labetalol (NORMODYNE) injection 5 mg (has no administration in time range)  hydrALAZINE (APRESOLINE) injection 2 mg (has no administration in time range)  amLODipine (NORVASC) tablet 2.5 mg (has no administration in time range)  metoprolol tartrate (LOPRESSOR) tablet 25 mg (0 mg Oral Hold 12/02/21 2258)  heparin ADULT infusion 100 units/mL (25000 units/247mL) (800 Units/hr Intravenous Rate/Dose Verify 12/02/21 2235)  HYDROcodone-acetaminophen (NORCO/VICODIN) 5-325 MG per tablet 1 tablet (has no administration in time range)  iohexol (OMNIPAQUE) 350 MG/ML injection 75 mL (75 mLs Intravenous Contrast Given  12/02/21 1538)  cefTRIAXone (ROCEPHIN) 2 g in sodium chloride 0.9 % 100 mL IVPB (0 g Intravenous Stopped 12/02/21 1801)  heparin bolus via infusion 4,000 Units (4,000 Units Intravenous  Bolus from Bag 12/02/21 2000)  LORazepam (ATIVAN) injection 1 mg (1 mg Intravenous Given 12/02/21 2054)      Consults: Neurology stroke consult, discussed with Dr. Theda Sers. Hospitalist consult Dr. Tobie Poet.   EMR reviewed PCP notes from recent visit for UTI and nausea, which confirmed patient is on Macrobid     FINAL CLINICAL IMPRESSION(S) / ED DIAGNOSES   Final diagnoses:  Cerebrovascular accident (CVA), unspecified mechanism ([place])  Weakness  Lower urinary tract infectious disease  Encephalopathy     Rx / DC Orders   ED Discharge Orders     None        Note:  This document was prepared using Dragon voice recognition software and may include unintentional dictation errors.   Duffy Bruce, MD 12/02/21 2342

## 2021-12-02 NOTE — Code Documentation (Signed)
Stroke Response Nurse Documentation Code Documentation  Erin Sutton is a 81 y.o. female arriving to [facility] ED via [facility] on 12/02/2021. On Eliquis (apixaban) daily. Last dose this am. Code stroke was activated by Triage RN.   Patient from home where she called EMS for generalized weakness and fatigue with recent dx of UTI. Pt was fine in triage and then had a sudden rt sided facial droop and lethargy.  Stroke team at the bedside on patient arrival. Labs drawn and patient cleared for CT by Dr. Ellender Hose. Patient to CT with team. NIHSS 9, see documentation for details and code stroke times. CT, CTA head and neck completed. Patient is not a candidate for IV Thrombolytic per Dr Theda Sers.  Care/Plan: Pt to have stroke workup.   Bedside handoff with ED RN [facility].    Velta Addison Stroke Designer, fashion/clothing

## 2021-12-02 NOTE — ED Notes (Signed)
Pt to MRI

## 2021-12-02 NOTE — Progress Notes (Signed)
CODE STROKE- PHARMACY COMMUNICATION ° ° °Time CODE STROKE called/page received:1520 ° °Time response to CODE STROKE was made (in person or via phone): 1520 ° °Time Stroke Kit retrieved from Pyxis (only if needed): no TNK ° °Name of Provider/Nurse contacted: Dr. Collins ° °Past Medical History:  °Diagnosis Date  ° Anxiety   ° Arthritis   ° Chronic kidney disease   ° Constipation   ° GERD (gastroesophageal reflux disease)   ° Hypertension   ° Hypothyroidism   ° Osteoporosis   ° Sleep apnea   ° Urinary incontinence   ° °Prior to Admission medications   °Medication Sig Start Date End Date Taking? Authorizing Provider  °acetaminophen (TYLENOL) 500 MG tablet Take 2 tablets (1,000 mg total) by mouth 3 (three) times daily. 08/14/21   Danford, Christopher P, MD  °amLODipine (NORVASC) 2.5 MG tablet Take 1 tablet (2.5 mg total) by mouth daily as needed (for SBP>180). 11/20/21 11/20/22  Robinson, Patrick, MD  °apixaban (ELIQUIS) 5 MG TABS tablet Take 1 tablet (5 mg total) by mouth 2 (two) times daily. 11/14/21   Ghimire, Kuber, MD  °atorvastatin (LIPITOR) 40 MG tablet Take 40 mg by mouth daily.    [provider]  °cyanocobalamin (,VITAMIN B-12,) 1000 MCG/ML injection Inject 1,000 mcg into the muscle See admin instructions. Inject 1000mcg once weekly for 4 weeks then recheck by Dr Johnson 05/06/21   [provider]  °diclofenac Sodium (VOLTAREN) 1 % GEL Apply 1 application topically daily. 11/08/21   [provider]  °levothyroxine (SYNTHROID, LEVOTHROID) 50 MCG tablet Take 50 mcg by mouth daily before breakfast.    [provider]  °LORazepam (ATIVAN) 0.5 MG tablet Take 0.5 mg by mouth every 8 (eight) hours as needed for anxiety or sleep.    [provider]  °melatonin 3 MG TABS tablet Take 1 tablet by mouth at bedtime as needed. 11/08/21   [provider]  °metoprolol tartrate (LOPRESSOR) 25 MG tablet Take 1 tablet (25 mg total) by mouth in the morning and at bedtime.  11/14/21 02/12/22  Ghimire, Kuber, MD  °neomycin-polymyxin b-dexamethasone (MAXITROL) 3.5-10000-0.1 OINT 1 application in the morning and at bedtime. (Affected eye/eyes)    [provider]  °nitrofurantoin, macrocrystal-monohydrate, (MACROBID) 100 MG capsule Take 1 capsule (100 mg total) by mouth 2 (two) times daily. 11/24/21   Ryan, Jeremy, NP  °oxyCODONE (OXY IR/ROXICODONE) 5 MG immediate release tablet Take 5 mg by mouth every 6 (six) hours as needed for severe pain.    [provider]  °pantoprazole (PROTONIX) 40 MG tablet Take 40 mg by mouth daily.    [provider]  °phenazopyridine (PYRIDIUM) 200 MG tablet Take 1 tablet (200 mg total) by mouth 3 (three) times daily. 11/24/21   Ryan, Jeremy, NP  °Polyethyl Glycol-Propyl Glycol (SYSTANE OP) Place 1 drop into both eyes 2 (two) times daily.    [provider]  °polyethylene glycol powder (GLYCOLAX/MIRALAX) 17 GM/SCOOP powder Take 17 g by mouth daily. 08/03/20   [provider]  °senna-docusate (SENOKOT-S) 8.6-50 MG tablet Take 2 tablets by mouth 2 (two) times daily as needed for mild constipation. 04/15/20   Zhang, Dekui, MD  °spironolactone (ALDACTONE) 25 MG tablet Take 1 tablet (25 mg total) by mouth daily. 11/14/21 02/12/22  Ghimire, Kuber, MD  °thiamine 100 MG tablet Take 1 tablet by mouth daily. 11/08/21   [provider]  °Zinc Oxide 40 % PSTE Apply 1 application topically 2 (two) times daily as needed. 11/08/21     [provider]    Sherilyn Banker ,PharmD Clinical Pharmacist  12/02/2021  3:22 PM

## 2021-12-02 NOTE — ED Triage Notes (Signed)
Pt here with weakness and fatigue. Pt called ems due to her feeling weak. Pt is currently on abx for a UTI. Pt lethargic in triage.

## 2021-12-03 ENCOUNTER — Observation Stay
Admit: 2021-12-03 | Discharge: 2021-12-03 | Disposition: A | Discharge status: Home / Self Care | Payer: Medicare HMO | Attending: Internal Medicine | Admitting: Internal Medicine

## 2021-12-03 DIAGNOSIS — M810 Age-related osteoporosis without current pathological fracture: Secondary | ICD-10-CM | POA: Diagnosis not present

## 2021-12-03 DIAGNOSIS — I480 Paroxysmal atrial fibrillation: Secondary | ICD-10-CM | POA: Diagnosis not present

## 2021-12-03 DIAGNOSIS — I674 Hypertensive encephalopathy: Secondary | ICD-10-CM | POA: Diagnosis not present

## 2021-12-03 DIAGNOSIS — N184 Chronic kidney disease, stage 4 (severe): Secondary | ICD-10-CM | POA: Diagnosis not present

## 2021-12-03 DIAGNOSIS — R531 Weakness: Secondary | ICD-10-CM

## 2021-12-03 DIAGNOSIS — M1990 Unspecified osteoarthritis, unspecified site: Secondary | ICD-10-CM | POA: Diagnosis not present

## 2021-12-03 DIAGNOSIS — G459 Transient cerebral ischemic attack, unspecified: Secondary | ICD-10-CM | POA: Diagnosis not present

## 2021-12-03 DIAGNOSIS — I129 Hypertensive chronic kidney disease with stage 1 through stage 4 chronic kidney disease, or unspecified chronic kidney disease: Secondary | ICD-10-CM | POA: Diagnosis not present

## 2021-12-03 DIAGNOSIS — N390 Urinary tract infection, site not specified: Secondary | ICD-10-CM | POA: Diagnosis not present

## 2021-12-03 DIAGNOSIS — I6389 Other cerebral infarction: Secondary | ICD-10-CM | POA: Diagnosis not present

## 2021-12-03 LAB — HEMOGLOBIN A1C
Hgb A1c MFr Bld: 5.3 % (ref 4.8–5.6)
Mean Plasma Glucose: 105.41 mg/dL

## 2021-12-03 LAB — ECHOCARDIOGRAM COMPLETE
AR max vel: 2.52 cm2
AV Area VTI: 3.13 cm2
AV Area mean vel: 2.68 cm2
AV Mean grad: 6 mmHg
AV Peak grad: 9.1 mmHg
Ao pk vel: 1.51 m/s
Area-P 1/2: 2.32 cm2
Height: 63 in
MV VTI: 4.07 cm2
S' Lateral: 2.2 cm
Weight: 2303.37 oz

## 2021-12-03 LAB — APTT
aPTT: 106 seconds — ABNORMAL HIGH (ref 24–36)
aPTT: 52 seconds — ABNORMAL HIGH (ref 24–36)

## 2021-12-03 LAB — LIPID PANEL
Cholesterol: 133 mg/dL (ref 0–200)
HDL: 50 mg/dL (ref >40)
LDL Cholesterol: 68 mg/dL (ref 0–99)
Total CHOL/HDL Ratio: 2.7 RATIO
Triglycerides: 74 mg/dL (ref <150)
VLDL: 15 mg/dL (ref 0–40)

## 2021-12-03 LAB — TROPONIN I (HIGH SENSITIVITY)
Troponin I (High Sensitivity): 69 ng/L — ABNORMAL HIGH (ref <18)
Troponin I (High Sensitivity): 43 ng/L — ABNORMAL HIGH (ref <18)

## 2021-12-03 LAB — HEPARIN LEVEL (UNFRACTIONATED): Heparin Unfractionated: >1.1 IU/mL — ABNORMAL HIGH (ref 0.30–0.70)

## 2021-12-03 LAB — URINE CULTURE

## 2021-12-03 LAB — CBG MONITORING, ED: Glucose-Capillary: 105 mg/dL — ABNORMAL HIGH (ref 70–99)

## 2021-12-03 MED ORDER — ASPIRIN EC 325 MG PO TBEC
325.0000 mg | DELAYED_RELEASE_TABLET | Freq: Every day | ORAL | Status: DC
  Administered 2021-12-03: 325 mg via ORAL
  Filled 2021-12-03: qty 1

## 2021-12-03 MED ORDER — POLYVINYL ALCOHOL 1.4 % OP SOLN
1.0000 [drp] | OPHTHALMIC | Status: DC | PRN
  Administered 2021-12-05 – 2021-12-06 (×3): 1 [drp] via OPHTHALMIC
  Filled 2021-12-03 (×2): qty 15

## 2021-12-03 MED ORDER — MUSCLE RUB 10-15 % EX CREA
TOPICAL_CREAM | CUTANEOUS | Status: DC | PRN
  Administered 2021-12-03: 1 via TOPICAL
  Filled 2021-12-03 (×2): qty 85

## 2021-12-03 MED ORDER — ASPIRIN 81 MG PO CHEW
81.0000 mg | CHEWABLE_TABLET | Freq: Every day | ORAL | Status: DC
  Administered 2021-12-04 – 2021-12-06 (×3): 81 mg via ORAL
  Filled 2021-12-03 (×3): qty 1

## 2021-12-03 MED ORDER — FENTANYL CITRATE PF 50 MCG/ML IJ SOSY
PREFILLED_SYRINGE | INTRAMUSCULAR | Status: AC
  Administered 2021-12-03: 12.5 ug via INTRAVENOUS
  Filled 2021-12-03: qty 1

## 2021-12-03 MED ORDER — APIXABAN 5 MG PO TABS
5.0000 mg | ORAL_TABLET | Freq: Two times a day (BID) | ORAL | Status: DC
  Administered 2021-12-03 – 2021-12-06 (×7): 5 mg via ORAL
  Filled 2021-12-03 (×7): qty 1

## 2021-12-03 MED ORDER — FENTANYL CITRATE PF 50 MCG/ML IJ SOSY: 12.5000 ug | PREFILLED_SYRINGE | Freq: Once | INTRAMUSCULAR | Status: AC

## 2021-12-03 NOTE — Progress Notes (Signed)
Neurology Progress Note Erin Sutton MR# [id number] 12/03/2021   S: no overnight events; no new complaints. Headache is improved.   O: Current vital signs: BP (!) 124/101    Pulse 75    Temp 98.8 F (37.1 C) (Oral)    Resp 20    Ht 5\' 3"  (1.6 m)    Wt 65.3 kg    SpO2 95%    BMI 25.50 kg/m  Vital signs in last 24 hours: Temp:  [98.8 F (37.1 C)] 98.8 F (37.1 C) (01/09 1509) Pulse Rate:  [61-98] 75 (01/10 0600) Resp:  [13-30] 20 (01/10 0600) BP: (91-209)/(50-101) 124/101 (01/10 0600) SpO2:  [80 %-100 %] 95 % (01/10 0600) Weight:  [65.3 kg] 65.3 kg (01/09 1510) GENERAL: Awake, alert in NAD HEENT: Normocephalic and atraumatic, moist mm, no LN++, no thyromegaly LUNGS: symmetric excursions bilaterally with no audible wheezes. CV: RR, equal pulses bilaterally. ABDOMEN: Soft, nontender, nondistended with normoactive BS Ext: warm, well perfused, intact peripheral pulses  NEURO:  Mental Status: AA&Ox3  Language: speech is fluent.  Intact naming, repetition, and comprehension. PERR. EOMI, visual fields full, no facial asymmetry, facial sensation intact, hearing intact. No evidence of tongue atrophy or fibrillations, tongue/uvula/soft palate midline elevates symmetrically  Normal sternocleidomastoid and trapezius muscle strength.  Motor: good strength in all extremities. Tone: Tone and bulk is normal Sensation: Intact to light touch bilaterally Coordination: FTN intact bilaterally, no ataxia in BLE. 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   Assessment: Erin Sutton is a 81 y.o. female PMHx as noted above, bilateral PE and LLE DVT (apixaban), baseline generalized weakness with headache and nausea. Exam this morning was improved,  no longer nauseated and headache has improved and she was no longer tearful. She stated her antihypertensive medications were changed and her blood pressure is not controlled at home. 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  Recommendations: Continue blood pressure control with goal <130/90 unless otherwise indicated by cardiology. Cardiology is evaluating patient for NSTEMI. Neurology will remain available and please call for questions.   Electronically signed by:  Lynnae Sandhoff, MD Page: [phone] 12/03/2021, 8:57 AM  If 7pm- 7am, please page neurology on call as listed in [place].

## 2021-12-03 NOTE — Consult Note (Signed)
ANTICOAGULATION CONSULT NOTE  Pharmacy Consult for Heparin Infusion Indication:  NSTEMI  Allergies  Allergen Reactions   Ace Inhibitors Cough   Levofloxacin    Penicillin G Other (See Comments)   Tape Other (See Comments)    Other reaction(s): Other (See Comments)   Amoxicillin Rash    Has patient had a PCN reaction causing immediate rash, facial/tongue/throat swelling, SOB or lightheadedness with hypotension: No Has patient had a PCN reaction causing severe rash involving mucus membranes or skin necrosis: No Has patient had a PCN reaction that required hospitalization: No Has patient had a PCN reaction occurring within the last 10 years: No If all of the above answers are "NO", then may proceed with Cephalosporin use.    Benicar [Olmesartan] Rash    Itching rash   Codeine Rash   Codeine Sulfate Rash   Levaquin [Levofloxacin In D5w] Rash    Alters mental status   Penicillin V Potassium Rash    Has patient had a PCN reaction causing immediate rash, facial/tongue/throat swelling, SOB or lightheadedness with hypotension: No Has patient had a PCN reaction causing severe rash involving mucus membranes or skin necrosis: No Has patient had a PCN reaction that required hospitalization: No Has patient had a PCN reaction occurring within the last 10 years: No If all of the above answers are "NO", then may proceed with Cephalosporin use.    Sulfa Antibiotics Rash    Patient Measurements: Height: 5\' 3"  (160 cm) Weight: 65.3 kg (143 lb 15.4 oz) IBW/kg (Calculated) : 52.4 Heparin Dosing Weight: 65.3 kg  Vital Signs: BP: 124/101 (01/10 0600) Pulse Rate: 75 (01/10 0600)  Labs: Recent Labs    12/02/21 1551 12/02/21 1713 12/03/21 0232 12/03/21 0639  HGB 11.8*  --   --   --   HCT 36.2  --   --   --   PLT 262  --   --   --   APTT 34  --  106*  --   LABPROT 18.1*  --   --   --   INR 1.5*  --   --   --   HEPARINUNFRC  --   --   --  >1.10*  CREATININE 0.77  --   --   --    TROPONINIHS 10 37*  --   --      Estimated Creatinine Clearance: 51 mL/min (by C-G formula based on SCr of 0.77 mg/dL).   Medical History: Past Medical History:  Diagnosis Date   Anxiety    Arthritis    Chronic kidney disease    Constipation    GERD (gastroesophageal reflux disease)    History of DVT (deep vein thrombosis) 07/27/2019   History of pulmonary embolism 07/14/2019   Bilateral   Hypertension    Hypothyroidism    Osteoporosis    Sleep apnea    Urinary incontinence     Medications:  Apixaban 5mg  BID PTA  Assessment: Pharmacy has been consulted to initiate heparin in 81yo patient with history of bilateral PE and DVT presenting to the ED d/t nausea and headache. Code stroke was called as patient was noted to be slumped over in room. Patient's last dose of Apixaban was at roughtly 0800 this morning(12/02/2021). Baseline labs: 34 sec, INR 1.5, Hgb 11.8, Plts 262  Goal of Therapy:  Heparin level 0.3-0.7 units/ml Monitor platelets by anticoagulation protocol: Yes  1/10 0232 aPTT 106, slightly supratherapeutic 1/10 0639 HL >1.10  cont w/ aPTT for now   Plan:  --  heparin infusion at 700 units/hr --Recheck aPTT level in 8 hours after rate change --Check heparin level daily until correlation with aPTT, then will transition to heparin level --Continue to monitor H&H and platelets  Chinita Greenland PharmD Clinical Pharmacist 12/03/2021

## 2021-12-03 NOTE — Consult Note (Signed)
CARDIOLOGY CONSULT NOTE               Patient ID: RHODA WALDVOGEL MRN: [id number] DOB/AGE: March 15, 1941 81 y.o.  Admit date: 12/02/2021 Referring Physician Dr. Damita Dunnings Primary Physician Dr. Harrel Lemon Primary Cardiologist Dr. Nehemiah Massed Reason for Consultation elevated troponin, chest pressure  HPI: Pt is an 70yoF with a PMH significant for atrial fibrillation on Eliquis, hypertension, HFpEF (LVEF 70-75% 12/2295, grade 1 diastolic dysfunction), hyperlipidemia, anxiety on daily ativan, hypothyroidism who presented to [facility] ED 12/02/2021 initially with generalized weakness in the setting of recent UTI.    The patient initially said she was having nausea and vomiting which was worsening and while in the waiting room she became abruptly less responsive, developed a left facial droop and left-sided weakness and a code stroke was called.  Per neurology the patient's exam was fluctuating and difficult to assess and thought to however be more likely related to hypertensive encephalopathy.  Per the hospitalist assessment later on afternoon the patient reported chest pressure for the past few months that waxes and wanes with associated nausea.  Cardiology was consulted for this chest pressure and her elevated troponin.   The patient has had 1 recent admission 11/01/2021 with acute encephalopathy, 12/12 at [facility] with hypertensive urgency and a another visit to [facility] ED 12/28 for high blood pressure and associated chest tightness.  The patient is examined this morning lying in bed.  She states the morning of 1/9 she felt tired and slightly nauseous while she was laying down.  She states she had associated chest tightness where she felt "like an elephant was sitting on her chest" and it resolved when she sat up and took some Zofran.  She states this chest tightness is midsternal, does not radiate, does not worsen with exertion, and is not associated with any shortness of breath or radiation anywhere  else.  She states that the sensation only lasts for "a short bit.  And always resolves when she sits up.She is status post right knee replacement 5 weeks ago and since then she has had some changes to her blood pressure regimen by multiple providers.  The patient states she takes her blood pressure daily and first thing in the morning it is 137/60 before she takes her blood pressure medications, however she does not take her blood pressure throughout the day.The afternoon of 1/9 she was working with physical therapy when the PT noticed that her blood pressure was significantly elevated and subsequently sent her to the ED for further evaluation.  Vital signs are significant for a blood pressure of 209/79 at 1600 which decreased to a low of 97/63 at 2315, since then it her systolic blood pressure has been in the 90s-low 120s throughout the early morning and it does not appear that she got any as needed antihypertensives that were ordered.  Imaging is significant for CTA head neck without large vessel occlusion, MRI revealing small remote cortical/subcortical left occipital infarct, and remote left thalamus lacunar infarct.   Labs are significant for sodium 132, creatinine 0.77, EGFR greater than 60.  Troponins trending 10-37-69 total cholesterol 133, HDL 50, LDL 68, triglycerides 74.  H&H 11.8/36.   Review of systems complete and found to be negative unless listed above     Past Medical History:  Diagnosis Date   Anxiety    Arthritis    Chronic kidney disease    Constipation    GERD (gastroesophageal reflux disease)    History of DVT (  deep vein thrombosis) 07/27/2019   History of pulmonary embolism 07/14/2019   Bilateral   Hypertension    Hypothyroidism    Osteoporosis    Sleep apnea    Urinary incontinence     Past Surgical History:  Procedure Laterality Date   BREAST BIOPSY Right    neg/stereo   BREAST BIOPSY Left    neg/stereo   BREAST EXCISIONAL BIOPSY Right    neg   CATARACT  EXTRACTION, BILATERAL     COLONOSCOPY WITH PROPOFOL N/A 07/02/2015   Procedure: COLONOSCOPY WITH PROPOFOL;  Surgeon: Manya Silvas, MD;  Location: [facility] ENDOSCOPY;  Service: Endoscopy;  Laterality: N/A;   CORRECTION HAMMER TOE Right    ESOPHAGOGASTRODUODENOSCOPY (EGD) WITH PROPOFOL N/A 07/02/2015   Procedure: ESOPHAGOGASTRODUODENOSCOPY (EGD) WITH PROPOFOL;  Surgeon: Manya Silvas, MD;  Location: [facility] ENDOSCOPY;  Service: Endoscopy;  Laterality: N/A;   JOINT REPLACEMENT Left    knee   REPLACEMENT TOTAL KNEE Left    SKIN BIOPSY Right     (Not in a hospital admission)  Social History   Socioeconomic History   Marital status: Widowed    Spouse name: Not on file   Number of children: Not on file   Years of education: Not on file   Highest education level: Not on file  Occupational History   Not on file  Tobacco Use   Smoking status: Never   Smokeless tobacco: Never  Vaping Use   Vaping Use: Never used  Substance and Sexual Activity   Alcohol use: No   Drug use: No   Sexual activity: Not Currently  Other Topics Concern   Not on file  Social History Narrative   Patient lives at home by herself.  Ambulates with a cane.   Has a daughter who lives in [place] Determinants of Health   Financial Resource Strain: Not on file  Food Insecurity: Not on file  Transportation Needs: Not on file  Physical Activity: Not on file  Stress: Not on file  Social Connections: Not on file  Intimate Partner Violence: Not on file    Family History  Problem Relation Age of Onset   Hypertension Mother    Diabetes Mother    Heart attack Mother    Asthma Mother    Peripheral vascular disease Mother    Heart attack Father    Hypertension Father    Diabetes Father    Colon cancer Maternal Aunt    Breast cancer Cousin       Review of systems complete and found to be negative unless listed above   PHYSICAL EXAM General: Pleasant elderly appearing Caucasian female , well  nourished, in no acute distress. Examined lying flat in ED bed.  HEENT:  Normocephalic and atraumatic. Neck:  No JVD.  Lungs: Normal respiratory effort on room air. Clear bilaterally to auscultation. No wheezes, crackles, rhonchi.  Heart: HRRR and occasional extra beats. Normal S1 and S2 without gallops or murmurs. Radial & DP pulses 2+ bilaterally. Abdomen: soft, some RUQ tenderness to palpation. Non-distended.  Msk: Normal strength and tone for age. Extremities: No clubbing, cyanosis or edema.   Neuro: Alert and oriented X 3. Psych:  Mood appropriate, affect congruent.   Labs:   Lab Results  Component Value Date   WBC 5.8 12/02/2021   HGB 11.8 (L) 12/02/2021   HCT 36.2 12/02/2021   MCV 89.4 12/02/2021   PLT 262 12/02/2021    Recent Labs  Lab 12/02/21 1551  NA  132*  K 4.0  CL 98  CO2 25  BUN 11  CREATININE 0.77  CALCIUM 9.7  PROT 6.9  BILITOT 0.6  ALKPHOS 48  ALT 10  AST 18  GLUCOSE 102*   Lab Results  Component Value Date   CKTOTAL 40 06/29/2021   CKMB < 0.5 (L) 08/13/2012   TROPONINI <0.03 09/02/2018    Lab Results  Component Value Date   CHOL 133 12/03/2021   CHOL 182 02/27/2021   CHOL 178 07/01/2018   Lab Results  Component Value Date   HDL 50 12/03/2021   HDL 93 02/27/2021   HDL 66 07/01/2018   Lab Results  Component Value Date   LDLCALC 68 12/03/2021   LDLCALC 60 02/27/2021   LDLCALC 97 07/01/2018   Lab Results  Component Value Date   TRIG 74 12/03/2021   TRIG 146 02/27/2021   TRIG 77 07/01/2018   Lab Results  Component Value Date   CHOLHDL 2.7 12/03/2021   CHOLHDL 2.0 02/27/2021   CHOLHDL 2.7 07/01/2018   No results found for: LDLDIRECT    Radiology: CT HEAD WO CONTRAST (5MM)  Result Date: 11/21/2021 CLINICAL DATA:  Mental status change, persistent or worsening. Seen here yesterday for hypertension. Severe headache. EXAM: CT HEAD WITHOUT CONTRAST TECHNIQUE: Contiguous axial images were obtained from the base of the skull through  the vertex without intravenous contrast. COMPARISON:  11/20/2021 FINDINGS: Brain: No evidence of acute infarction, hemorrhage, hydrocephalus, extra-axial collection or mass lesion/mass effect. Mild cerebral atrophy. Low-attenuation changes in the deep white matter most likely representing small vessel ischemia. Basal ganglia calcifications. No change since prior study. Vascular: Moderate intracranial arterial vascular calcifications. Skull: Calvarium appears intact. Sinuses/Orbits: Paranasal sinuses and mastoid air cells are clear. Other: No significant change since prior study. IMPRESSION: No acute intracranial abnormalities. Mild chronic atrophy and small vessel ischemic changes. Electronically Signed   By: Lucienne Capers M.D.   On: 11/21/2021 15:36   CT HEAD WO CONTRAST (5MM)  Result Date: 11/20/2021 CLINICAL DATA:  Altered mental status. EXAM: CT HEAD WITHOUT CONTRAST TECHNIQUE: Contiguous axial images were obtained from the base of the skull through the vertex without intravenous contrast. COMPARISON:  Head CT dated 11/11/2021. FINDINGS: Brain: The ventricles and sulci appropriate size for patient's age. Mild periventricular and deep white matter chronic microvascular ischemic changes noted. There is no acute intracranial hemorrhage. No mass effect or midline shift. No extra-axial fluid collection. Vascular: No hyperdense vessel or unexpected calcification. Skull: Normal. Negative for fracture or focal lesion. Sinuses/Orbits: No acute finding. Other: None IMPRESSION: 1. No acute intracranial pathology. 2. Mild chronic microvascular ischemic changes. Electronically Signed   By: Anner Crete M.D.   On: 11/20/2021 21:19   CT Head Wo Contrast  Result Date: 11/11/2021 CLINICAL DATA:  Weakness.  Mental status changes. EXAM: CT HEAD WITHOUT CONTRAST TECHNIQUE: Contiguous axial images were obtained from the base of the skull through the vertex without intravenous contrast. COMPARISON:  08/17/2021  FINDINGS: Brain: No mass lesion, hemorrhage, hydrocephalus, acute infarct, intra-axial, or extra-axial fluid collection. Vascular: No hyperdense vessel or unexpected calcification. Intracranial atherosclerosis. Skull: No significant soft tissue swelling.  No skull fracture. Sinuses/Orbits: Normal imaged portions of the orbits and globes. Clear paranasal sinuses and mastoid air cells. Other: None. IMPRESSION: No acute intracranial abnormality. Electronically Signed   By: Abigail Miyamoto M.D.   On: 11/11/2021 12:55   MR BRAIN WO CONTRAST  Result Date: 12/02/2021 CLINICAL DATA:  Initial evaluation for neuro deficit, stroke suspected. EXAM:  MRI HEAD WITHOUT CONTRAST TECHNIQUE: Multiplanar, multiecho pulse sequences of the brain and surrounding structures were obtained without intravenous contrast. COMPARISON:  Prior CTs from earlier the same day. FINDINGS: Brain: Cerebral volume within normal limits. Minimal chronic microvascular ischemic disease noted involving the periventricular white matter. Small remote cortical/subcortical infarct noted at the parasagittal left parieto-occipital region. Remote lacunar infarct present at the left thalamus. No abnormal foci of restricted diffusion to suggest acute or subacute ischemia. Gray-white matter differentiation maintained. No other areas of chronic cortical infarction. No evidence for acute intracranial hemorrhage. Single chronic microhemorrhage noted at the anterior left frontal lobe, of doubtful significance in isolation. No mass lesion, midline shift or mass effect. No hydrocephalus or extra-axial fluid collection. Pituitary gland suprasellar region normal. Vascular: Major intracranial vascular flow voids are maintained. Skull and upper cervical spine: Craniocervical junction within normal limits. Bone marrow signal intensity normal. No scalp soft tissue abnormality. Sinuses/Orbits: Patient status post bilateral ocular lens replacement. Globes and orbital soft tissues  demonstrate no acute finding. Paranasal sinuses are largely clear. No significant mastoid effusion. Other: None. IMPRESSION: 1. No acute intracranial abnormality. 2. 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 3. Underlying minimal for age chronic microvascular ischemic disease. Electronically Signed   By: Jeannine Boga M.D.   On: 12/02/2021 23:51   DG Chest Port 1 View  Result Date: 12/02/2021 CLINICAL DATA:  Weakness, fatigue EXAM: PORTABLE CHEST 1 VIEW COMPARISON:  11/21/2021 FINDINGS: Stable cardiomediastinal contours. Aortic atherosclerosis. No focal airspace consolidation, pleural effusion, or pneumothorax. Degenerative changes of the shoulders, left worse than right. IMPRESSION: No active disease. Electronically Signed   By: Davina Poke D.O.   On: 12/02/2021 16:18   DG Chest Port 1 View  Result Date: 11/21/2021 CLINICAL DATA:  81 year old female with a history chest pain EXAM: PORTABLE CHEST 1 VIEW COMPARISON:  11/20/2021 FINDINGS: Cardiomediastinal silhouette unchanged in size and contour. Calcifications of the aortic arch. No evidence of central vascular congestion. No interlobular septal thickening. Lung volumes remain low. Coarsened interstitial markings with no new confluent airspace disease. Similar appearance of asymmetric elevation of the right hemidiaphragm. No pneumothorax or pleural effusion. No acute displaced fracture. Degenerative changes of the spine. IMPRESSION: Negative for acute cardiopulmonary disease. Electronically Signed   By: Corrie Mckusick D.O.   On: 11/21/2021 15:53   DG Chest Portable 1 View  Result Date: 11/20/2021 CLINICAL DATA:  Hypertension EXAM: PORTABLE CHEST 1 VIEW COMPARISON:  11/11/2021 FINDINGS: Shallow inspiration with low lung volumes. Mild bibasilar atelectasis/scarring. No pleural effusion. No pneumothorax. IMPRESSION: Mild bibasilar atelectasis/scarring.  Electronically Signed   By: Macy Mis M.D.   On: 11/20/2021 16:17   DG Chest Portable 1 View  Result Date: 11/11/2021 CLINICAL DATA:  Weakness, altered mental status, recent knee surgery EXAM: PORTABLE CHEST 1 VIEW COMPARISON:  Portable exam 1201 hours compared to 08/29/2021 FINDINGS: Normal heart size, mediastinal contours, and pulmonary vascularity. Atherosclerotic calcification aorta. Minimal atelectasis LEFT base. No definite infiltrate, pleural effusion, or pneumothorax. Bones demineralized IMPRESSION: Minimal LEFT basilar atelectasis. Aortic Atherosclerosis (ICD10-I70.0). Electronically Signed   By: Lavonia Dana M.D.   On: 11/11/2021 12:13   CT HEAD CODE STROKE WO CONTRAST  Result Date: 12/02/2021 CLINICAL DATA:  Code stroke.  Weakness and fatigue EXAM: CT HEAD WITHOUT CONTRAST TECHNIQUE: Contiguous axial images were obtained from the base of the skull through the vertex without intravenous contrast. COMPARISON:  11/21/2021. FINDINGS: Brain: No evidence of acute infarction, hemorrhage, cerebral edema,  mass, mass effect, or midline shift. Ventricles and sulci are normal for age. No extra-axial fluid collection. Periventricular white matter changes, likely the sequela of chronic small vessel ischemic disease. Basal ganglia calcifications. Vascular: No hyperdense vessel or unexpected calcification. Skull: Normal. Negative for fracture or focal lesion. Sinuses/Orbits: No acute finding. Other: The mastoid air cells are well aerated. ASPECTS [facility] Stroke Program Early CT Score) - Ganglionic level infarction (caudate, lentiform nuclei, internal capsule, insula, M1-M3 cortex): 7 - Supraganglionic infarction (M4-M6 cortex): 3 Total score (0-10 with 10 being normal): 10 IMPRESSION: 1. No acute intracranial process. 2. ASPECTS is 10. Code stroke imaging results were communicated on 12/02/2021 at 3:40 pm to provider Dr. Theda Sers via secure text paging. Electronically Signed   By: Merilyn Baba M.D.   On:  12/02/2021 15:41   CT ANGIO HEAD NECK W WO CM (CODE STROKE)  Result Date: 12/02/2021 CLINICAL DATA:  Weakness and fatigue EXAM: CT ANGIOGRAPHY HEAD AND NECK TECHNIQUE: Multidetector CT imaging of the head and neck was performed using the standard protocol during bolus administration of intravenous contrast. Multiplanar CT image reconstructions and MIPs were obtained to evaluate the vascular anatomy. Carotid stenosis measurements (when applicable) are obtained utilizing NASCET criteria, using the distal internal carotid diameter as the denominator. CONTRAST:  47m OMNIPAQUE IOHEXOL 350 MG/ML SOLN COMPARISON:  08/12/2021 CTA head neck, correlation is made with same day CT head. FINDINGS: CT HEAD FINDINGS For noncontrast findings, please see same day CT head. CTA NECK FINDINGS Aortic arch: Standard branching. Imaged portion shows no evidence of aneurysm or dissection. No significant stenosis of the major arch vessel origins. Aortic atherosclerosis, which extends into the branch vessels. Right carotid system: Calcified plaque at the bifurcation and in the proximal right ICA, which causes less than 50% stenosis. No dissection or occlusion. Left carotid system: No dissection or occlusion. Minimal calcified plaque at bifurcation, without significant stenosis. Vertebral arteries: Codominant. No evidence of dissection, stenosis (50% or greater) or occlusion. Skeleton: Degenerative changes in the cervical spine. No acute osseous abnormality. Other neck: Prior left thyroidectomy.  Otherwise negative. Upper chest: Focal pulmonary opacity or pleural effusion. Review of the MIP images confirms the above findings CTA HEAD FINDINGS Anterior circulation: Both internal carotid arteries are patent to the termini, with moderate calcifications but without significant stenosis. A1 segments patent. Normal anterior communicating artery. Anterior cerebral arteries are patent to their distal aspects. No M1 stenosis or occlusion. Normal  MCA bifurcations. Moderate stenosis again noted in a left M2 branch. Distal MCA branches perfused, although there is mildly more robust perfusion on the right compared to the left, without additional focal stenosis. Posterior circulation: Vertebral arteries patent to the vertebrobasilar junction without stenosis. Basilar patent to its distal aspect. Superior cerebellar arteries patent bilaterally. Bilateral P1 segments originate from the basilar artery, although the right posterior communicating artery is present. PCAs perfused to their distal aspects without stenosis. The left posterior communicating artery is not visualized. Venous sinuses: As permitted by contrast timing, patent. Anatomic variants: None significant Review of the MIP images confirms the above findings IMPRESSION: 1.  No intracranial large vessel occlusion or significant stenosis. 2.  No hemodynamically significant stenosis in the neck. 3. 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 Code stroke imaging results were communicated on 12/02/2021 at 4:05 pm to provider Dr. CTheda Sersvia secure text paging. Electronically Signed   By: AMerilyn BabaM.D.   On: 12/02/2021 16:05    ECHO LVEF 70-75% 46/5465 grade 1 diastolic dysfunction -  repeat ordered   TELEMETRY reviewed by me: NSR rate of 75  EKG reviewed by me: NSR RBBB rate 86  ASSESSMENT AND PLAN:  80yoF with a PMH significant for atrial fibrillation on Eliquis, hypertension, HFpEF (LVEF 70-75% 04/7288, grade 1 diastolic dysfunction), hyperlipidemia, anxiety on daily ativan, hypothyroidism who presented to [facility] ED 12/02/2021 initially with generalized weakness in the setting of recent UTI and later reported chest pressure and nausea. In the waiting room, pt experienced sudden aphasia and left sided weakness and a code stroke was called. Neurology believes her symptoms are related moreso to her uncontrolled hypertension. Cardiology was consulted for elevated troponin and  chest pressure  #TIA #hypertensive emergency  The patient states that she has had some changes to her blood pressure regimen since her knee replacement and after subsequent ED visits/hospitalizations over the past 5 weeks.  She states she was last instructed to take her amlodipine at 2.5 mg only when her systolic blood pressure was greater than 180 which she states has happened 3-4 times in a month. Troponin elevation likely to demand ischemia with her uncontrolled blood pressure.  -The patient is n.p.o. until seen by SLP after the code stroke yesterday -aspirin 376m today and 867mdaily therafter. -keep heparin gtt for 48hrs -troponins trending 10-37-69 and fourth repeat pending Home blood pressure regimen includes amlodipine 2.5 mg once daily, (previously took amlodipine 10 mg prior to her knee replacement apparently), metoprolol tartrate 25 mg twice daily, spironolactone 25 mg once daily -recommend increasing her amlodipine back to 1067mt discharge and continuing other medications at their current doses. Goal SBP <130. -Repeat echocardiogram performed this morning and result is pending -Consider further ischemic evaluation on outpatient basis due to her current TIA. -she will need close outpatient follow up with her normal cardiologist Dr. KowNehemiah Massed 1-2 weeks after discharge  #UTI Agree with antibiotics  #HFpEF (LVEF 70-75% 4/27/9150rade 1 diastolic dysfunction) Appears euvolemic at this time.  Medications as above  #hyperlipidemia Continue atorvastatin 40 mg  #atrial fibrillation on Eliquis Patient is currently in normal sinus rhythm with ventricular rate of 74 bpm CHA2DS2-VASc 8 Continue Eliquis  This patient was seen and examined by Dr. RyaDonnelly Angelicad he is in agreement with this plan of care.   Signed: LilTristan SchroederPA-C 12/03/2021, 7:59 AM

## 2021-12-03 NOTE — ED Notes (Signed)
pt requesting pain medication due to severe leg pain- is crying. She failed swallow screen earlier and can't have PO meds. Let pt know I would message provider asking if can have other pain medication

## 2021-12-03 NOTE — Consult Note (Signed)
ANTICOAGULATION CONSULT NOTE  Pharmacy Consult for Heparin Infusion Indication:  NSTEMI  Allergies  Allergen Reactions   Ace Inhibitors Cough   Levofloxacin    Penicillin G Other (See Comments)   Tape Other (See Comments)    Other reaction(s): Other (See Comments)   Amoxicillin Rash    Has patient had a PCN reaction causing immediate rash, facial/tongue/throat swelling, SOB or lightheadedness with hypotension: No Has patient had a PCN reaction causing severe rash involving mucus membranes or skin necrosis: No Has patient had a PCN reaction that required hospitalization: No Has patient had a PCN reaction occurring within the last 10 years: No If all of the above answers are "NO", then may proceed with Cephalosporin use.    Benicar [Olmesartan] Rash    Itching rash   Codeine Rash   Codeine Sulfate Rash   Levaquin [Levofloxacin In D5w] Rash    Alters mental status   Penicillin V Potassium Rash    Has patient had a PCN reaction causing immediate rash, facial/tongue/throat swelling, SOB or lightheadedness with hypotension: No Has patient had a PCN reaction causing severe rash involving mucus membranes or skin necrosis: No Has patient had a PCN reaction that required hospitalization: No Has patient had a PCN reaction occurring within the last 10 years: No If all of the above answers are "NO", then may proceed with Cephalosporin use.    Sulfa Antibiotics Rash    Patient Measurements: Height: 5\' 3"  (160 cm) Weight: 65.3 kg (143 lb 15.4 oz) IBW/kg (Calculated) : 52.4 Heparin Dosing Weight: 65.3 kg  Vital Signs: BP: 122/84 (01/10 0230) Pulse Rate: 82 (01/10 0230)  Labs: Recent Labs    12/02/21 1551 12/02/21 1713 12/03/21 0232  HGB 11.8*  --   --   HCT 36.2  --   --   PLT 262  --   --   APTT 34  --  106*  LABPROT 18.1*  --   --   INR 1.5*  --   --   CREATININE 0.77  --   --   TROPONINIHS 10 37*  --      Estimated Creatinine Clearance: 51 mL/min (by C-G formula based  on SCr of 0.77 mg/dL).   Medical History: Past Medical History:  Diagnosis Date   Anxiety    Arthritis    Chronic kidney disease    Constipation    GERD (gastroesophageal reflux disease)    History of DVT (deep vein thrombosis) 07/27/2019   History of pulmonary embolism 07/14/2019   Bilateral   Hypertension    Hypothyroidism    Osteoporosis    Sleep apnea    Urinary incontinence     Medications:  Apixaban 5mg  BID PTA  Assessment: Pharmacy has been consulted to initiate heparin in 80yo patient with history of bilateral PE and DVT presenting to the ED d/t nausea and headache. Code stroke was called as patient was noted to be slumped over in room. Patient's last dose of Apixaban was at roughtly 0800 this morning(12/02/2021). Baseline labs: 34 sec, INR 1.5, Hgb 11.8, Plts 262  Goal of Therapy:  Heparin level 0.3-0.7 units/ml Monitor platelets by anticoagulation protocol: Yes  1/10 0232 aPTT 106, slightly supratherapeutic   Plan:  --Decrease heparin infusion to 700 units/hr --Recheck aPTT level in 8 hours after rate change --Check heparin level daily until correlation with aPTT, then will transition to heparin dosing --Continue to monitor H&H and platelets  Renda Rolls, PharmD, [facility] 12/03/2021 3:13 AM

## 2021-12-03 NOTE — Progress Notes (Signed)
*  PRELIMINARY RESULTS* Echocardiogram 2D Echocardiogram has been performed.  Sherrie Sport 12/03/2021, 8:42 AM

## 2021-12-03 NOTE — ED Notes (Signed)
Pt stated the muscle cream irritated her skin, pt wiped cream off. Pt states she wants melatonin to sleep, informed pt unable to give PO meds d/t failed swallow screen.

## 2021-12-03 NOTE — Evaluation (Addendum)
Clinical/Bedside Swallow Evaluation Patient Details  Name: Erin Sutton MRN: [id number] Date of Birth: 11/06/1941  Today's Date: 12/03/2021 Time: SLP Start Time (ACUTE ONLY): 55 SLP Stop Time (ACUTE ONLY): 1125 SLP Time Calculation (min) (ACUTE ONLY): 60 min  Past Medical History:  Past Medical History:  Diagnosis Date   Anxiety    Arthritis    Chronic kidney disease    Constipation    GERD (gastroesophageal reflux disease)    History of DVT (deep vein thrombosis) 07/27/2019   History of pulmonary embolism 07/14/2019   Bilateral   Hypertension    Hypothyroidism    Osteoporosis    Sleep apnea    Urinary incontinence    Past Surgical History:  Past Surgical History:  Procedure Laterality Date   BREAST BIOPSY Right    neg/stereo   BREAST BIOPSY Left    neg/stereo   BREAST EXCISIONAL BIOPSY Right    neg   CATARACT EXTRACTION, BILATERAL     COLONOSCOPY WITH PROPOFOL N/A 07/02/2015   Procedure: COLONOSCOPY WITH PROPOFOL;  Surgeon: Manya Silvas, MD;  Location: [facility] ENDOSCOPY;  Service: Endoscopy;  Laterality: N/A;   CORRECTION HAMMER TOE Right    ESOPHAGOGASTRODUODENOSCOPY (EGD) WITH PROPOFOL N/A 07/02/2015   Procedure: ESOPHAGOGASTRODUODENOSCOPY (EGD) WITH PROPOFOL;  Surgeon: Manya Silvas, MD;  Location: [facility] ENDOSCOPY;  Service: Endoscopy;  Laterality: N/A;   JOINT REPLACEMENT Left    knee   REPLACEMENT TOTAL KNEE Left    SKIN BIOPSY Right    HPI:  Pt  is a 81 y.o. female with medical history significant for atrial fibrillation on anticoagulation, on Eliquis, hypertension, hyperlipidemia, Dementia, generalized anxiety disorder, insomnia,  Hiatal Hernia, GERD, who presents emergency department for chief concern of weakness and fatigue.     She reports that she presented to the ED for chief concern of chest pressure. She reports she has been experiencing chest pressure for the last few months. She reports that most recently her chest pressure comes and goes and is in  the epigastric region.   MRI: no acute issues.  CXR: No active disease.    Assessment / Plan / Recommendation  Clinical Impression  Pt appears to present w/ adequate oropharyngeal phase swallow function w/ reduced risk for aspiration from an oropharyngeal phase standpoint when following general aspiration precautions. No overt neuromuscular apparent during oropharyngeal swallowing. However, pt does have a baseline of Moderate REFLUX/GERD and Hiatal Hernia per chart notes and previous BSE in 2019. She c/o significant Esophageal dysmotility (pointing to her mid sternum area) then and stated "the food gets stuck here" and "then I start coughing and spitting phlegm". She endorsed she still feels discomfort in her mid-sternum area "at times" during/after meals. Pt is on a PPI, 40mg . She required min cues to remain focused to task - easily distracted. She does have a Baseline of Dementia, Cognitive decline per chart.      Pt consumed trials of ice chips, thin liquids via Cup, and purees/softened solids w/ no overt s/s of aspiration noted; no decline in vocal quality or respiratory status during trials. O2 sats remained 98% during/post eval. Oral phase was [facility] for bolus management and mastication of the trials given; timely A-P transit and oral clearing noted b/t trials. Of note; Min+ Belching noted post po trials. OM exam appeared [facility] for lingual/labial movements, sensitive Gag+, and bolus management/clearing post swallowing. Pt fed self given education not to use straws when drinking to reduce air swalloed. She appeared min distracted at times; speech  clear.     Recommend a Regular diet (w/ well- cut meats, moistened) w/ Thin liquids via Cup; general aspiration precautions; Pills Whole in Puree for safer swallowing. REFLUX PRECAUTIONS. MD/NSG updated. No further skilled ST services indicated at this time. NSG to reconsult if new needs arise during admit. Recommend GI f/u for Esophageal phase dysmotility, GERD  issues and management.   Addendum: pt is verbally communicating her wants/needs to others at this time w/ effectiveness. Pt has a Baseline of Dementia per chart history. MRI was negative for new acute issues. No acute skilled ST services for Cognitive-linguistic issues indicated at this time.  SLP Visit Diagnosis: Dysphagia, unspecified (R13.10) (Esophageal phase dysmotility; GERD)    Aspiration Risk   (reduced following general aspiration precautions)    Diet Recommendation   Regular diet (w/ well- cut meats, moistened) w/ Thin liquids via Cup for improved control and less air swallowed; general aspiration precautions; REFLUX PRECAUTIONS. Tray setup and positioning at meals.   Medication Administration: Whole meds with puree (for safer swallowing)    Other  Recommendations Recommended Consults: Consider GI evaluation;Consider esophageal assessment Oral Care Recommendations: Oral care BID;Oral care before and after PO;Patient independent with oral care (setup support) Other Recommendations:  (n/a)    Recommendations for follow up therapy are one component of a multi-disciplinary discharge planning process, led by the attending physician.  Recommendations may be updated based on patient status, additional functional criteria and insurance authorization.  Follow up Recommendations No SLP follow up      Assistance Recommended at Discharge Set up Supervision/Assistance  Functional Status Assessment Patient has had a recent decline in their functional status and demonstrates the ability to make significant improvements in function in a reasonable and predictable amount of time.  Frequency and Duration  (n/a)   (n/a)       Prognosis Prognosis for Safe Diet Advancement: Good Barriers to Reach Goals: Time post onset;Severity of deficits;Cognitive deficits (baseline GERD, Hiatal hernia)      Swallow Study   General Date of Onset: 12/02/21 HPI: Pt  is a 81 y.o. female with medical history  significant for atrial fibrillation on anticoagulation, on Eliquis, hypertension, hyperlipidemia, Dementia, generalized anxiety disorder, insomnia,  Hiatal Hernia, GERD, who presents emergency department for chief concern of weakness and fatigue.     She reports that she presented to the ED for chief concern of chest pressure. She reports she has been experiencing chest pressure for the last few months. She reports that most recently her chest pressure comes and goes and is in the epigastric region.   MRI: no acute issues.  CXR: No active disease. Type of Study: Bedside Swallow Evaluation Previous Swallow Assessment: 2019 Diet Prior to this Study: Thin liquids;Regular (meats minced) Temperature Spikes Noted: No (wbc 5.8) Respiratory Status: Room air History of Recent Intubation: No Behavior/Cognition: Alert;Cooperative;Pleasant mood;Confused;Distractible;Requires cueing (min - baseline Dementia per chart) Oral Cavity Assessment: Within Functional Limits Oral Care Completed by SLP: Yes Oral Cavity - Dentition: Adequate natural dentition;Missing dentition (few) Vision: Functional for self-feeding Self-Feeding Abilities: Able to feed self;Needs assist;Needs set up Patient Positioning: Upright in bed (positioning support) Baseline Vocal Quality: Normal Volitional Cough: Strong Volitional Swallow: Able to elicit    Oral/Motor/Sensory Function Overall Oral Motor/Sensory Function: Within functional limits   Ice Chips Ice chips: Within functional limits Presentation: Spoon (fed; 2 trials)   Thin Liquid Thin Liquid: Within functional limits Presentation: Cup;Self Fed (~3-4 ozs total) Other Comments: water, juice    Nectar Thick Nectar  Thick Liquid: Not tested   Honey Thick Honey Thick Liquid: Not tested   Puree Puree: Within functional limits Presentation: Spoon;Self Fed (4 ozs)   Solid     Solid: Within functional limits (adequate - moistened) Presentation: Self Fed;Spoon (7 trials)         Orinda Kenner, MS, CCC-SLP Speech Language Pathologist Rehab Services [phone] Kielan Dreisbach 12/03/2021,4:27 PM

## 2021-12-03 NOTE — Evaluation (Signed)
Occupational Therapy Evaluation Patient Details Name: Erin Sutton MRN: [id number] DOB: March 01, 1941 Today's Date: 12/03/2021   History of Present Illness Erin Sutton is an 81 y.o. female with medical history significant for atrial fibrillation on anticoagulation, on Eliquis, hypertension, hyperlipidemia, dementia, generalized anxiety disorder, insomnia, GERD, who presents emergency department for chief concern of weakness and fatigue.   Clinical Impression   Ms Ow was seen for OT evaluation this date. Prior to hospital admission, pt was MOD I for mobility and ADLs using 3WW. Pt lives alone with no family/friends available per pt report. Pt presents to acute OT demonstrating impaired ADL performance and functional mobility 2/2 decreased activity tolerance and functional strength/ROM/balance deficits. Pt currently requires CGA sup<>sit. MAX A for LB access seated EOB. MIN A + RW initial sit<>stand improving to CGA second trial - assist for balance and ~3 ft side steps along EOB. Tolerates ~1 min standing x2 trials. CGA seated UB ADLs, anticipate MIN A for standing grooming tasks. Pt would benefit from skilled OT to address noted impairments and functional limitations (see below for any additional details). Upon hospital discharge, recommend STR to maximize pt safety and return to PLOF.       Recommendations for follow up therapy are one component of a multi-disciplinary discharge planning process, led by the attending physician.  Recommendations may be updated based on patient status, additional functional criteria and insurance authorization.   Follow Up Recommendations  Skilled nursing-short term rehab (<3 hours/day)    Assistance Recommended at Discharge Frequent or constant Supervision/Assistance  Patient can return home with the following A little help with walking and/or transfers;A lot of help with bathing/dressing/bathroom;Assistance with cooking/housework;Help with stairs or ramp  for entrance    Functional Status Assessment  Patient has had a recent decline in their functional status and demonstrates the ability to make significant improvements in function in a reasonable and predictable amount of time.  Equipment Recommendations  BSC/3in1    Recommendations for Other Services       Precautions / Restrictions Precautions Precautions: Fall Restrictions Weight Bearing Restrictions: No      Mobility Bed Mobility Overal bed mobility: Needs Assistance Bed Mobility: Supine to Sit;Sit to Supine     Supine to sit: Min guard;HOB elevated Sit to supine: Min guard;HOB elevated        Transfers Overall transfer level: Needs assistance Equipment used: Rolling walker (2 wheels) Transfers: Sit to/from Stand Sit to Stand: From elevated surface;Min assist           General transfer comment: assist for balance      Balance Overall balance assessment: Needs assistance Sitting-balance support: No upper extremity supported;Feet supported Sitting balance-Leahy Scale: Fair     Standing balance support: Bilateral upper extremity supported;Reliant on assistive device for balance Standing balance-Leahy Scale: Poor                             ADL either performed or assessed with clinical judgement   ADL Overall ADL's : Needs assistance/impaired                                       General ADL Comments: MAX A for LB access seated EOB. MIN A + RW for ADL t/f - assist for balance. Tolerates ~1 min standing x2 trials. CGA seated UB ADLs, anticipate MIN A for standing  grooming tasks      Pertinent Vitals/Pain Pain Assessment: No/denies pain     Hand Dominance Right   Extremity/Trunk Assessment Upper Extremity Assessment Upper Extremity Assessment: Overall WFL for tasks assessed   Lower Extremity Assessment Lower Extremity Assessment: Generalized weakness   Cervical / Trunk Assessment Cervical / Trunk Assessment:  Normal   Communication Communication Communication: No difficulties   Cognition Arousal/Alertness: Awake/alert Behavior During Therapy: WFL for tasks assessed/performed Overall Cognitive Status: Within Functional Limits for tasks assessed                                                  Home Living Family/patient expects to be discharged to:: Private residence Living Arrangements: Alone   Type of Home: Mobile home Home Access: Stairs to enter;Ramped entrance     Home Layout: One level     Bathroom Shower/Tub: Teacher, early years/pre: Standard Bathroom Accessibility: No   Home Equipment: Cane - single point;BSC/3in1;Tub bench;Hand held shower head;Other (comment) (3 wheel walker)          Prior Functioning/Environment Prior Level of Function : Independent/Modified Independent             Mobility Comments: reports no recent falls but decreased mobility in last ~2 weeks as she has been admitted to hospital multiple times          OT Problem List: Decreased strength;Impaired balance (sitting and/or standing);Decreased cognition;Decreased activity tolerance;Decreased coordination;Decreased safety awareness;Decreased knowledge of use of DME or AE      OT Treatment/Interventions: Self-care/ADL training;DME and/or AE instruction;Therapeutic activities;Balance training;Therapeutic exercise;Energy conservation;Patient/family education    OT Goals(Current goals can be found in the care plan section) Acute Rehab OT Goals Patient Stated Goal: to go home OT Goal Formulation: With patient Time For Goal Achievement: 12/17/21 Potential to Achieve Goals: Fair ADL Goals Pt Will Perform Grooming: with supervision;standing (c LRAD PRN) Pt Will Perform Lower Body Dressing: with supervision;sit to/from stand;with adaptive equipment (c LRAD PRN) Pt Will Transfer to Toilet: with supervision;ambulating;regular height toilet (c LRAD PRN) Additional ADL  Goal #1: Pt will independently verbalize plan to implement x3 falls prevention strategies  OT Frequency: Min 2X/week    Co-evaluation              AM-PAC OT "6 Clicks" Daily Activity     Outcome Measure Help from another person eating meals?: None Help from another person taking care of personal grooming?: A Little Help from another person toileting, which includes using toliet, bedpan, or urinal?: A Lot Help from another person bathing (including washing, rinsing, drying)?: A Lot Help from another person to put on and taking off regular upper body clothing?: A Little Help from another person to put on and taking off regular lower body clothing?: A Lot 6 Click Score: 16   End of Session Equipment Utilized During Treatment: Rolling walker (2 wheels)  Activity Tolerance: Patient limited by fatigue Patient left: in bed;with call bell/phone within reach  OT Visit Diagnosis: Unsteadiness on feet (R26.81);Muscle weakness (generalized) (M62.81)                Time: 7371-0626 OT Time Calculation (min): 18 min Charges:  OT General Charges $OT Visit: 1 Visit OT Evaluation $OT Eval Low Complexity: 1 Low OT Treatments $Self Care/Home Management : 8-22 mins  Dessie Coma, M.S. OTR/L  12/03/21,  10:20 AM  ascom [phone]
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
## 2021-12-03 NOTE — ED Notes (Signed)
MD Marcille Blanco regarding pt troponin of 69 called from lab at this time.

## 2021-12-03 NOTE — Progress Notes (Signed)
PROGRESS NOTE    Erin Sutton  KYH:[id number] DOB: 17-Jun-1941 DOA: 12/02/2021 PCP: Baxter Hire, MD  [id number]   Assessment & Plan:   Principal Problem:   TIA (transient ischemic attack) Active Problems:   Confusion   Hypothyroidism   HTN (hypertension)   GERD (gastroesophageal reflux disease)   Anxiety   Abnormal gait   Hyperlipidemia   Sleep apnea   Dementia (HCC)   Generalized anxiety disorder   Paroxysmal atrial fibrillation (HCC)   Chronic anticoagulation - on eliquis for afib   Elevated troponin   Weakness   Erin Sutton is a 81 y.o. female with medical history significant for atrial fibrillation on Eliquis, hypertension, dementia, generalized anxiety disorder, insomnia, GERD, who presented emergency department for chief concern of weakness and fatigue and chest pressure.   # Stroke ruled out - Neurology has been consulted  --MRI brain neg for acute stroke   # Elevated troponin 2/2 demand ischemia # NSTEMI ruled out --possibly from elevated BP.  Trop peaked at 53 then trended down. --d/c heparin gtt  # Generalized weakness --pt reported she started feeling weak and unwell after she was switched to metop from labetalol  --discuss with cardiology about changing pt's metop --PT/OT rec SNF rehab   # Hypertension --BP in 200's on presentation, however, has been intermittently low even though home BP meds haven't been given. Plan: --hold home amlodipine --cont home Lopressor with holding parameter --discuss with cardiology about changing pt's metop   # Hypothyroid -Home dose of levothyroxine 50 mcg daily resumed   # Generalized anxiety disorder # History of panic attacks   # History of diastolic heart failure  --current Echo showed normal systolic and diastolic function   # Atrial fibrillation on anticoagulation -cont home Eliquis --cont home Lopressor with holding parameter --discuss with cardiology about changing pt's metop   #  Hyperlipidemia -atorvastatin 40 mg daily resumed   # Insomnia -melatonin 5 mg nightly as needed for sleep resumed   # Vaginal bleeding  - admitter advised patient that she will need to be evaluated outpatient by gynecologist; patient understands she needs to see a gynocologist    DVT prophylaxis: TD:[id number] Code Status: Full code  Family Communication:  Level of care: Med-Surg Dispo:   The patient is from: home Anticipated d/c is to: SNF Anticipated d/c date is: whenever bed avaiable  Patient currently is medically ready to d/c.   Subjective and Interval History:  Pt reported nausea improved.  Pain localized in epigastric region.  Passed SLP eval.   Objective: Vitals:   12/03/21 1400 12/03/21 1430 12/03/21 1613 12/03/21 1656  BP: 136/87 110/60 126/88 (!) 101/48  Pulse: 97 92 98 80  Resp:  20 20 16   Temp:  98.7 F (37.1 C)  98 F (36.7 C)  TempSrc:  Oral  Oral  SpO2: 99% 100% 96% 99%  Weight:      Height:        Intake/Output Summary (Last 24 hours) at 12/03/2021 1959 Last data filed at 12/03/2021 1700 Gross per 24 hour  Intake 240 ml  Output --  Net 240 ml   Filed Weights   12/02/21 1510  Weight: 65.3 kg    Examination:   Constitutional: NAD, AAOx3 HEENT: conjunctivae and lids normal, EOMI CV: No cyanosis.   RESP: normal respiratory effort, on RA SKIN: warm, dry Neuro: II - XII grossly intact.   Psych: Normal mood and affect.  Appropriate judgement and reason   Data Reviewed:  I have personally reviewed following labs and imaging studies  CBC: Recent Labs  Lab 12/02/21 1551  WBC 5.8  NEUTROABS 3.9  HGB 11.8*  HCT 36.2  MCV 89.4  PLT 093   Basic Metabolic Panel: Recent Labs  Lab 12/02/21 1551  NA 132*  K 4.0  CL 98  CO2 25  GLUCOSE 102*  BUN 11  CREATININE 0.77  CALCIUM 9.7   GFR: Estimated Creatinine Clearance: 51 mL/min (by C-G formula based on SCr of 0.77 mg/dL). Liver Function Tests: Recent Labs  Lab 12/02/21 1551  AST 18   ALT 10  ALKPHOS 48  BILITOT 0.6  PROT 6.9  ALBUMIN 3.8   No results for input(s): LIPASE, AMYLASE in the last 168 hours. No results for input(s): AMMONIA in the last 168 hours. Coagulation Profile: Recent Labs  Lab 12/02/21 1551  INR 1.5*   Cardiac Enzymes: No results for input(s): CKTOTAL, CKMB, CKMBINDEX, TROPONINI in the last 168 hours. BNP (last 3 results) No results for input(s): PROBNP in the last 8760 hours. HbA1C: Recent Labs    12/03/21 0232  HGBA1C 5.3   CBG: Recent Labs  Lab 12/02/21 1525  GLUCAP 105*   Lipid Profile: Recent Labs    12/03/21 0232  CHOL 133  HDL 50  LDLCALC 68  TRIG 74  CHOLHDL 2.7   Thyroid Function Tests: Recent Labs    12/02/21 1551  TSH 0.512   Anemia Panel: Recent Labs    12/02/21 1551  VITAMINB12 235   Sepsis Labs: Recent Labs  Lab 12/02/21 1513 12/02/21 1713  LATICACIDVEN 1.4 1.3    Recent Results (from the past 240 hour(s))  Urine Culture     Status: Abnormal   Collection Time: 11/24/21  3:15 PM   Specimen: Urine, Clean Catch  Result Value Ref Range Status   Specimen Description   Final    URINE, CLEAN CATCH Performed at [facility], [address], [place], [place] [address]    Special Requests   Final    NONE Performed at [facility], [address] [place], [place] [address]    Culture MULTIPLE SPECIES PRESENT, SUGGEST RECOLLECTION (A)  Final   Report Status 11/27/2021 FINAL  Final  Wet prep, genital     Status: Abnormal   Collection Time: 11/24/21  3:53 PM   Specimen: Vaginal  Result Value Ref Range Status   Yeast Wet Prep HPF POC NONE SEEN NONE SEEN Final   Trich, Wet Prep NONE SEEN NONE SEEN Final   Clue Cells Wet Prep HPF POC NONE SEEN NONE SEEN Final   WBC, Wet Prep HPF POC <10 (A) <10 Final   Sperm NONE SEEN  Final    Comment: Performed at [facility], [address] [place], [place] [address]  Culture, blood (Routine x 2)     Status: None  (Preliminary result)   Collection Time: 12/02/21  3:50 PM   Specimen: BLOOD  Result Value Ref Range Status   Specimen Description BLOOD LAC  Final   Special Requests BOTTLES DRAWN AEROBIC AND ANAEROBIC BCAV  Final   Culture   Final    NO GROWTH < 24 HOURS Performed at [facility], [address] [place],  [address]    Report Status PENDING  Incomplete  Resp Panel by RT-PCR (Flu A&B, Covid) Nasopharyngeal Swab     Status: None   Collection Time: 12/02/21  3:51 PM   Specimen: Nasopharyngeal Swab; Nasopharyngeal(NP) swabs  in vial transport medium  Result Value Ref Range Status   SARS Coronavirus 2 by RT PCR NEGATIVE NEGATIVE Final    Comment: (NOTE) SARS-CoV-2 target nucleic acids are NOT DETECTED.  The SARS-CoV-2 RNA is generally detectable in upper respiratory specimens during the acute phase of infection. The lowest concentration of SARS-CoV-2 viral copies this assay can detect is 138 copies/mL. A negative result does not preclude SARS-Cov-2 infection and should not be used as the sole basis for treatment or other patient management decisions. A negative result may occur with  improper specimen collection/handling, submission of specimen other than nasopharyngeal swab, presence of viral mutation(s) within the areas targeted by this assay, and inadequate number of viral copies(<138 copies/mL). A negative result must be combined with clinical observations, patient history, and epidemiological information. The expected result is Negative.  Fact Sheet for Patients:  EntrepreneurPulse.com.au  Fact Sheet for Healthcare Providers:  IncredibleEmployment.be  This test is no t yet approved or cleared by the [place] FDA and  has been authorized for detection and/or diagnosis of SARS-CoV-2 by FDA under an Emergency Use Authorization (EUA). This EUA will remain  in effect (meaning this test can be used) for the duration of  the COVID-19 declaration under Section 564(b)(1) of the Act, 21 U.S.C.section 360bbb-3(b)(1), unless the authorization is terminated  or revoked sooner.       Influenza A by PCR NEGATIVE NEGATIVE Final   Influenza B by PCR NEGATIVE NEGATIVE Final    Comment: (NOTE) The Xpert Xpress SARS-CoV-2/FLU/RSV plus assay is intended as an aid in the diagnosis of influenza from Nasopharyngeal swab specimens and should not be used as a sole basis for treatment. Nasal washings and aspirates are unacceptable for Xpert Xpress SARS-CoV-2/FLU/RSV testing.  Fact Sheet for Patients: EntrepreneurPulse.com.au  Fact Sheet for Healthcare Providers: IncredibleEmployment.be  This test is not yet approved or cleared by the [place] FDA and has been authorized for detection and/or diagnosis of SARS-CoV-2 by FDA under an Emergency Use Authorization (EUA). This EUA will remain in effect (meaning this test can be used) for the duration of the COVID-19 declaration under Section 564(b)(1) of the Act, 21 U.S.C. section 360bbb-3(b)(1), unless the authorization is terminated or revoked.  Performed at [facility], [address] [place], [place] [address]   Culture, blood (Routine x 2)     Status: None (Preliminary result)   Collection Time: 12/02/21  4:00 PM   Specimen: BLOOD  Result Value Ref Range Status   Specimen Description BLOOD [facility]  Final   Special Requests BOTTLES DRAWN AEROBIC AND ANAEROBIC BCAV  Final   Culture   Final    NO GROWTH < 24 HOURS Performed at [facility], [address], [place], [place] [address]    Report Status PENDING  Incomplete      Radiology Studies: MR BRAIN WO CONTRAST  Result Date: 12/02/2021 CLINICAL DATA:  Initial evaluation for neuro deficit, stroke suspected. EXAM: MRI HEAD WITHOUT CONTRAST TECHNIQUE: Multiplanar, multiecho pulse sequences of the brain and surrounding structures were obtained without  intravenous contrast. COMPARISON:  Prior CTs from earlier the same day. FINDINGS: Brain: Cerebral volume within normal limits. Minimal chronic microvascular ischemic disease noted involving the periventricular white matter. Small remote cortical/subcortical infarct noted at the parasagittal left parieto-occipital region. Remote lacunar infarct present at the left thalamus. No abnormal foci of restricted diffusion to suggest acute or subacute ischemia. Gray-white matter differentiation maintained. No other areas of chronic cortical infarction. No evidence for acute  intracranial hemorrhage. Single chronic microhemorrhage noted at the anterior left frontal lobe, of doubtful significance in isolation. No mass lesion, midline shift or mass effect. No hydrocephalus or extra-axial fluid collection. Pituitary gland suprasellar region normal. Vascular: Major intracranial vascular flow voids are maintained. Skull and upper cervical spine: Craniocervical junction within normal limits. Bone marrow signal intensity normal. No scalp soft tissue abnormality. Sinuses/Orbits: Patient status post bilateral ocular lens replacement. Globes and orbital soft tissues demonstrate no acute finding. Paranasal sinuses are largely clear. No significant mastoid effusion. Other: None. IMPRESSION: 1. No acute intracranial abnormality. 2. 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 3. Underlying minimal for age chronic microvascular ischemic disease. Electronically Signed   By: Jeannine Boga M.D.   On: 12/02/2021 23:51   DG Chest Port 1 View  Result Date: 12/02/2021 CLINICAL DATA:  Weakness, fatigue EXAM: PORTABLE CHEST 1 VIEW COMPARISON:  11/21/2021 FINDINGS: Stable cardiomediastinal contours. Aortic atherosclerosis. No focal airspace consolidation, pleural effusion, or pneumothorax. Degenerative changes of the shoulders, left worse than right.  IMPRESSION: No active disease. Electronically Signed   By: Davina Poke D.O.   On: 12/02/2021 16:18   ECHOCARDIOGRAM COMPLETE  Result Date: 12/03/2021    ECHOCARDIOGRAM REPORT   Patient Name:   Erin Sutton Date of Exam: 12/03/2021 Medical Rec #:  [id number]       Height:       63.0 in Accession #:    [id number]      Weight:       144.0 lb Date of Birth:  29-Mar-1941       BSA:          1.681 m Patient Age:    81 years        BP:           124/101 mmHg Patient Gender: F               HR:           75 bpm. Exam Location:  [facility] Procedure: 2D Echo, Cardiac Doppler and Color Doppler Indications:     Stroke I63.9  History:         Patient has prior history of Echocardiogram examinations, most                  recent 02/28/2021. Risk Factors:Hypertension. CKD, history of DVT                  and pulmonary embolism.  Sonographer:     Sherrie Sport Referring Phys:  [id number] AMY N COX Diagnosing Phys: Donnelly Angelica  Sonographer Comments: Suboptimal apical window. IMPRESSIONS  1. Left ventricular ejection fraction, by estimation, is 60 to 65%. The left ventricle has normal function. The left ventricle has no regional wall motion abnormalities. Left ventricular diastolic parameters were normal.  2. Right ventricular systolic function is normal. The right ventricular size is normal.  3. The mitral valve is normal in structure. No evidence of mitral valve regurgitation. No evidence of mitral stenosis.  4. The aortic valve is normal in structure. Aortic valve regurgitation is not visualized. No aortic stenosis is present.  5. The inferior vena cava is normal in size with greater than 50% respiratory variability, suggesting right atrial pressure of 3 mmHg. FINDINGS  Left Ventricle: Left ventricular ejection fraction, by estimation, is 60 to 65%. The left ventricle has normal function. The left ventricle has no regional wall motion abnormalities. The left ventricular internal  cavity size was normal in size. There is  no left  ventricular hypertrophy. Left ventricular diastolic parameters were normal. Right Ventricle: The right ventricular size is normal. No increase in right ventricular wall thickness. Right ventricular systolic function is normal. Left Atrium: Left atrial size was normal in size. Right Atrium: Right atrial size was normal in size. Pericardium: There is no evidence of pericardial effusion. Mitral Valve: The mitral valve is normal in structure. No evidence of mitral valve regurgitation. No evidence of mitral valve stenosis. MV peak gradient, 6.2 mmHg. The mean mitral valve gradient is 2.0 mmHg. Tricuspid Valve: The tricuspid valve is normal in structure. Tricuspid valve regurgitation is not demonstrated. No evidence of tricuspid stenosis. Aortic Valve: The aortic valve is normal in structure. Aortic valve regurgitation is not visualized. No aortic stenosis is present. Aortic valve mean gradient measures 6.0 mmHg. Aortic valve peak gradient measures 9.1 mmHg. Aortic valve area, by VTI measures 3.13 cm. Pulmonic Valve: The pulmonic valve was normal in structure. Pulmonic valve regurgitation is not visualized. No evidence of pulmonic stenosis. Aorta: The aortic root is normal in size and structure. Venous: The inferior vena cava is normal in size with greater than 50% respiratory variability, suggesting right atrial pressure of 3 mmHg. IAS/Shunts: No atrial level shunt detected by color flow Doppler.  LEFT VENTRICLE PLAX 2D LVIDd:         3.60 cm   Diastology LVIDs:         2.20 cm   LV e' medial:    5.44 cm/s LV PW:         1.10 cm   LV E/e' medial:  8.9 LV IVS:        0.80 cm   LV e' lateral:   6.42 cm/s LVOT diam:     2.00 cm   LV E/e' lateral: 7.5 LV SV:         84 LV SV Index:   50 LVOT Area:     3.14 cm  RIGHT VENTRICLE RV Basal diam:  2.30 cm RV S prime:     8.92 cm/s TAPSE (M-mode): 2.2 cm LEFT ATRIUM             Index        RIGHT ATRIUM           Index LA diam:        3.90 cm 2.32 cm/m   RA Area:     11.70 cm  LA Vol (A2C):   38.5 ml 22.90 ml/m  RA Volume:   26.10 ml  15.52 ml/m LA Vol (A4C):   42.1 ml 25.04 ml/m LA Biplane Vol: 41.4 ml 24.62 ml/m  AORTIC VALVE                     PULMONIC VALVE AV Area (Vmax):    2.52 cm      PV Vmax:        1.23 m/s AV Area (Vmean):   2.68 cm      PV Vmean:       77.600 cm/s AV Area (VTI):     3.13 cm      PV VTI:         0.215 m AV Vmax:           151.00 cm/s   PV Peak grad:   6.1 mmHg AV Vmean:          113.000 cm/s  PV Mean grad:   3.0 mmHg  AV VTI:            0.268 m       RVOT Peak grad: 7 mmHg AV Peak Grad:      9.1 mmHg AV Mean Grad:      6.0 mmHg LVOT Vmax:         121.00 cm/s LVOT Vmean:        96.500 cm/s LVOT VTI:          0.267 m LVOT/AV VTI ratio: 1.00  AORTA Ao Root diam: 2.90 cm MITRAL VALVE               TRICUSPID VALVE MV Area (PHT): 2.32 cm    TR Peak grad:   15.2 mmHg MV Area VTI:   4.07 cm    TR Vmax:        195.00 cm/s MV Peak grad:  6.2 mmHg MV Mean grad:  2.0 mmHg    SHUNTS MV Vmax:       1.25 m/s    Systemic VTI:  0.27 m MV Vmean:      65.0 cm/s   Systemic Diam: 2.00 cm MV Decel Time: 327 msec    Pulmonic VTI:  0.222 m MV E velocity: 48.40 cm/s MV A velocity: 83.60 cm/s MV E/A ratio:  0.58 Donnelly Angelica Electronically signed by Donnelly Angelica Signature Date/Time: 12/03/2021/1:25:48 PM    Final    CT HEAD CODE STROKE WO CONTRAST  Result Date: 12/02/2021 CLINICAL DATA:  Code stroke.  Weakness and fatigue EXAM: CT HEAD WITHOUT CONTRAST TECHNIQUE: Contiguous axial images were obtained from the base of the skull through the vertex without intravenous contrast. COMPARISON:  11/21/2021. FINDINGS: Brain: No evidence of acute infarction, hemorrhage, cerebral edema, mass, mass effect, or midline shift. Ventricles and sulci are normal for age. No extra-axial fluid collection. Periventricular white matter changes, likely the sequela of chronic small vessel ischemic disease. Basal ganglia calcifications. Vascular: No hyperdense vessel or unexpected calcification. Skull:  Normal. Negative for fracture or focal lesion. Sinuses/Orbits: No acute finding. Other: The mastoid air cells are well aerated. ASPECTS [facility] Stroke Program Early CT Score) - Ganglionic level infarction (caudate, lentiform nuclei, internal capsule, insula, M1-M3 cortex): 7 - Supraganglionic infarction (M4-M6 cortex): 3 Total score (0-10 with 10 being normal): 10 IMPRESSION: 1. No acute intracranial process. 2. ASPECTS is 10. Code stroke imaging results were communicated on 12/02/2021 at 3:40 pm to provider Dr. Theda Sers via secure text paging. Electronically Signed   By: Merilyn Baba M.D.   On: 12/02/2021 15:41   CT ANGIO HEAD NECK W WO CM (CODE STROKE)  Result Date: 12/02/2021 CLINICAL DATA:  Weakness and fatigue EXAM: CT ANGIOGRAPHY HEAD AND NECK TECHNIQUE: Multidetector CT imaging of the head and neck was performed using the standard protocol during bolus administration of intravenous contrast. Multiplanar CT image reconstructions and MIPs were obtained to evaluate the vascular anatomy. Carotid stenosis measurements (when applicable) are obtained utilizing NASCET criteria, using the distal internal carotid diameter as the denominator. CONTRAST:  68mL OMNIPAQUE IOHEXOL 350 MG/ML SOLN COMPARISON:  08/12/2021 CTA head neck, correlation is made with same day CT head. FINDINGS: CT HEAD FINDINGS For noncontrast findings, please see same day CT head. CTA NECK FINDINGS Aortic arch: Standard branching. Imaged portion shows no evidence of aneurysm or dissection. No significant stenosis of the major arch vessel origins. Aortic atherosclerosis, which extends into the branch vessels. Right carotid system: Calcified plaque at the bifurcation and in the proximal right ICA, which causes less  than 50% stenosis. No dissection or occlusion. Left carotid system: No dissection or occlusion. Minimal calcified plaque at bifurcation, without significant stenosis. Vertebral arteries: Codominant. No evidence of dissection, stenosis  (50% or greater) or occlusion. Skeleton: Degenerative changes in the cervical spine. No acute osseous abnormality. Other neck: Prior left thyroidectomy.  Otherwise negative. Upper chest: Focal pulmonary opacity or pleural effusion. Review of the MIP images confirms the above findings CTA HEAD FINDINGS Anterior circulation: Both internal carotid arteries are patent to the termini, with moderate calcifications but without significant stenosis. A1 segments patent. Normal anterior communicating artery. Anterior cerebral arteries are patent to their distal aspects. No M1 stenosis or occlusion. Normal MCA bifurcations. Moderate stenosis again noted in a left M2 branch. Distal MCA branches perfused, although there is mildly more robust perfusion on the right compared to the left, without additional focal stenosis. Posterior circulation: Vertebral arteries patent to the vertebrobasilar junction without stenosis. Basilar patent to its distal aspect. Superior cerebellar arteries patent bilaterally. Bilateral P1 segments originate from the basilar artery, although the right posterior communicating artery is present. PCAs perfused to their distal aspects without stenosis. The left posterior communicating artery is not visualized. Venous sinuses: As permitted by contrast timing, patent. Anatomic variants: None significant Review of the MIP images confirms the above findings IMPRESSION: 1.  No intracranial large vessel occlusion or significant stenosis. 2.  No hemodynamically significant stenosis in the neck. 3. 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 Code stroke imaging results were communicated on 12/02/2021 at 4:05 pm to provider Dr. Theda Sers via secure text paging. Electronically Signed   By: Merilyn Baba M.D.   On: 12/02/2021 16:05     Scheduled Meds:   stroke: mapping our early stages of recovery book   Does not apply Once   acetaminophen  1,000 mg Oral Once   amLODipine  2.5 mg Oral Daily    apixaban  5 mg Oral BID   [START ON 12/04/2021] aspirin  81 mg Oral Daily   aspirin EC  325 mg Oral Daily   atorvastatin  40 mg Oral Daily   levothyroxine  50 mcg Oral QAC breakfast   metoprolol tartrate  25 mg Oral BID   pantoprazole  40 mg Oral Daily   Continuous Infusions:   LOS: 0 days     Enzo Bi, MD [facility] If 7PM-7AM, please contact night-coverage 12/03/2021, 7:59 PM

## 2021-12-03 NOTE — Evaluation (Signed)
Physical Therapy Evaluation Patient Details Name: Erin Sutton MRN: [id number] DOB: January 06, 1941 Today's Date: 12/03/2021  History of Present Illness  Erin Sutton is an 81 y.o. female with medical history significant for atrial fibrillation on anticoagulation, on Eliquis, hypertension, hyperlipidemia, dementia, generalized anxiety disorder, insomnia, GERD, who presents emergency department for chief concern of weakness and fatigue.   Clinical Impression  Pt received supine in bed, just completed SLP evaluation and is agreeable to PT evaluation. Pt lives alone in a mobile home, has a ramped entrance and states she has no one in the area to assist her at d/c. 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   Pt performed bed mobility with CGA to ensure safety due to mild posterior LOB at trunk, increased time/effort and heavy use of bed rails. She uses BUE to lift RLE back to bed. Pt reported symptoms including dizziness and lethargy while sitting EOB - BP: 138/48, difficulty maintaining open eyes. Symptoms began to improve after ~ 5 minutes of sitting; difficulty maintaining balance at this time. Attempted standing using RW, MIN A for heavy steadying and assisting pt to boost hips up to ER bed. Drop in BP while standing to 112/66. RN and MD aware. PT recommending pt d/c to SNF at d/c as she is not safe to d/c home without caregiver assist. Would benefit from skilled PT to address above deficits and promote optimal return to PLOF.      Recommendations for follow up therapy are one component of a multi-disciplinary discharge planning process, led by the attending physician.  Recommendations may be updated based on patient status, additional functional criteria and insurance authorization.  Follow Up Recommendations Skilled nursing-short term rehab (<3 hours/day)    Assistance Recommended at Discharge Frequent or constant  Supervision/Assistance  Patient can return home with the following  Assistance with cooking/housework;A lot of help with walking and/or transfers;A lot of help with bathing/dressing/bathroom;Assist for transportation;Help with stairs or ramp for entrance    Equipment Recommendations Other (comment) (TBD in next venue)  Recommendations for Other Services       Functional Status Assessment Patient has had a recent decline in their functional status and demonstrates the ability to make significant improvements in function in a reasonable and predictable amount of time.     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 Uses BUE to lift RLE back into bed. PT gaurding trunk due to mild posterior LOB when sitting up.    Transfers Overall transfer level: Needs assistance Equipment used: Rolling walker (2 wheels) Transfers: Sit to/from Stand Sit to Stand: From elevated surface;Min assist           General transfer comment: assist for balance and to boost back up to the elevated ER bed (stool used - PT assisting pt to lift and place BLE on stool)    Ambulation/Gait               General Gait Details: deferred - limited by orthostatics  Stairs  Wheelchair Mobility    Modified Rankin (Stroke Patients Only)       Balance Overall balance assessment: Needs assistance Sitting-balance support: No upper extremity supported;Feet supported Sitting balance-Leahy Scale: Poor Sitting balance - Comments: Continued to lean left onto elevated HOB. VC to maintain upright and open  eyes. Increased sway of trunk.   Standing balance support: Bilateral upper extremity supported;Reliant on assistive device for balance Standing balance-Leahy Scale: Poor Standing balance comment: MIN A for steadying using RW; increased sway. VC to open eyes. Remained standing <1 minute.                             Pertinent Vitals/Pain Pain Assessment: No/denies pain    Home Living Family/patient expects to be discharged to:: Private residence Living Arrangements: Alone Available Help at Discharge: Other (Comment) (per pt - no one assist) Type of Home: Mobile home Home Access: Ramped entrance       Home Layout: One level Home Equipment: [place] - single point;BSC/3in1;Tub bench;Hand held shower head;Other (comment) (3 wheel walker) Additional Comments: uses 3-wheel walker for standing/ambulation    Prior Function Prior Level of Function : Independent/Modified Independent             Mobility Comments: Reports no falls in the last year but decreased mobility in last ~2 weeks as she has been admitted to hospital multiple times. Has been receiving HHPT for R TKA (Novemeber 2022). States she does have trouble getting in and out of bed sometimes. ADLs Comments: IND with ADLs and IADLs. Does not drive (since TKA); hired help for transportation and groceries.     Hand Dominance   Dominant Hand: Right    Extremity/Trunk Assessment   Upper Extremity Assessment Upper Extremity Assessment: Overall WFL for tasks assessed    Lower Extremity Assessment Lower Extremity Assessment: Generalized weakness;RLE deficits/detail RLE Deficits / Details: increased weakness in RLE compared to LLE. 3/5 hip flexion BLE; 4/5 knee extension LLE, 3/5 knee ext RLE; 5/5 DF LLE, 2/5 DF RLE. Decreased R knee extension. 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    Cervical / Trunk Assessment Cervical / Trunk Assessment: Normal  Communication   Communication: No difficulties  Cognition Arousal/Alertness:  Awake/alert;Lethargic Behavior During Therapy: WFL for tasks assessed/performed Overall Cognitive Status: Within Functional Limits for tasks assessed                                 General Comments: Follows commands. Did become lethargic upon sitting up (orthostatic); required frequent VC to open eyes and maintain sitting balance.        General Comments General comments (skin integrity, edema, etc.): Pt became orthostatic sitting EOB and standing - symptomatic with dizziness and lethargy. RN and MD aware.    Exercises     Assessment/Plan    PT Assessment Patient needs continued PT services  PT Problem List Decreased strength;Decreased activity tolerance;Decreased knowledge of use of DME;Decreased balance;Decreased mobility;Decreased knowledge of precautions;Decreased safety awareness;Decreased range of motion       PT Treatment Interventions DME instruction;Therapeutic exercise;Gait training;Balance training;Neuromuscular re-education;Functional mobility training;Therapeutic activities;Patient/family education;Cognitive remediation    PT Goals (Current goals can be found in the Care Plan section)  Acute Rehab PT Goals Patient Stated Goal: to go home PT Goal Formulation: With patient Time For Goal Achievement: 12/17/21 Potential to Achieve Goals: Fair    Frequency Min 2X/week  Co-evaluation               AM-PAC PT "6 Clicks" Mobility  Outcome Measure Help needed turning from your back to your side while in a flat bed without using bedrails?: A Little Help needed moving from lying on your back to sitting on the side of a flat bed without using bedrails?: A Little Help needed moving to and from a bed to a chair (including a wheelchair)?: A Little Help needed standing up from a chair using your arms (e.g., wheelchair or bedside chair)?: A Little Help needed to walk in hospital room?: A Lot Help needed climbing 3-5 steps with a railing? : Total 6 Click  Score: 15    End of Session Equipment Utilized During Treatment: Gait belt Activity Tolerance: Patient limited by fatigue;Patient limited by lethargy Patient left: in bed;with call bell/phone within reach Nurse Communication: Mobility status;Other (comment) (orthostatic) PT Visit Diagnosis: Unsteadiness on feet (R26.81);Muscle weakness (generalized) (M62.81);Difficulty in walking, not elsewhere classified (R26.2)    Time: 4970-2637 PT Time Calculation (min) (ACUTE ONLY): 32 min   Charges:   PT Evaluation $PT Eval Moderate Complexity: 1 Mod PT Treatments $Therapeutic Activity: 23-37 mins        Patrina Levering PT, DPT 12/03/21 12:48 PM [phone]

## 2021-12-03 NOTE — Care Management Obs Status (Deleted)
[place] NOTIFICATION   Patient Details  Name: Erin Sutton MRN: [id number] Date of Birth: 24-Jul-1941   Medicare Observation Status Notification Given:       Shelbie Hutching, RN 12/03/2021, 2:10 PM

## 2021-12-03 NOTE — TOC Progression Note (Signed)
Transition of Care [facility]) - Progression Note    Patient Details  Name: Erin Sutton MRN: [id number] Date of Birth: 11-Mar-1941  Transition of Care [facility]) CM/SW Contact  Shelbie Hutching, RN Phone Number: 12/03/2021, 4:23 PM  Clinical Narrative:    [facility] has offered a bed- bed offer accepted.    Expected Discharge Plan: [place] Barriers to Discharge: Continued Medical Work up  Expected Discharge Plan and Services Expected Discharge Plan: [place]   Discharge Planning Services: CM Consult Post Acute Care Choice: [place] Living arrangements for the past 2 months: Mobile Home                 DME Arranged: N/A DME Agency: NA       HH Arranged: NA HH Agency: NA         Social Determinants of Health (SDOH) Interventions    Readmission Risk Interventions No flowsheet data found.

## 2021-12-03 NOTE — Care Management Obs Status (Signed)
[place] NOTIFICATION   Patient Details  Name: Erin Sutton MRN: [id number] Date of Birth: 1941/02/28   Medicare Observation Status Notification Given:  Yes    Shelbie Hutching, RN 12/03/2021, 2:10 PM

## 2021-12-03 NOTE — ED Notes (Signed)
Explained to pt that she is unable to have oral meds d/t NPO b/c she failed swallow screen. Pt states she feels like she wasn't given swallow screen appropriately. Informed pt that SLP will see pt in morning. Explained to pt that BP is too low for IV pain meds.  Gave pt heat pack. Pt took heat packs out and asked for ice pack. Ice pack placed on pt's right knee, pt states, "it is not going to work."

## 2021-12-04 DIAGNOSIS — G459 Transient cerebral ischemic attack, unspecified: Secondary | ICD-10-CM | POA: Diagnosis not present

## 2021-12-04 DIAGNOSIS — I5030 Unspecified diastolic (congestive) heart failure: Secondary | ICD-10-CM | POA: Diagnosis not present

## 2021-12-04 DIAGNOSIS — I480 Paroxysmal atrial fibrillation: Secondary | ICD-10-CM | POA: Diagnosis not present

## 2021-12-04 DIAGNOSIS — I161 Hypertensive emergency: Secondary | ICD-10-CM | POA: Diagnosis not present

## 2021-12-04 LAB — MAGNESIUM: Magnesium: 1.9 mg/dL (ref 1.7–2.4)

## 2021-12-04 LAB — CBC
HCT: 33.8 % — ABNORMAL LOW (ref 36.0–46.0)
Hemoglobin: 10.9 g/dL — ABNORMAL LOW (ref 12.0–15.0)
MCH: 28.7 pg (ref 26.0–34.0)
MCHC: 32.2 g/dL (ref 30.0–36.0)
MCV: 88.9 fL (ref 80.0–100.0)
Platelets: 263 10*3/uL (ref 150–400)
RBC: 3.8 MIL/uL — ABNORMAL LOW (ref 3.87–5.11)
RDW: 16.5 % — ABNORMAL HIGH (ref 11.5–15.5)
WBC: 6.1 10*3/uL (ref 4.0–10.5)
nRBC: 0 % (ref 0.0–0.2)

## 2021-12-04 LAB — BASIC METABOLIC PANEL
Anion gap: 9 (ref 5–15)
BUN: 24 mg/dL — ABNORMAL HIGH (ref 8–23)
CO2: 25 mmol/L (ref 22–32)
Calcium: 9.4 mg/dL (ref 8.9–10.3)
Chloride: 103 mmol/L (ref 98–111)
Creatinine, Ser: 0.99 mg/dL (ref 0.44–1.00)
GFR, Estimated: 58 mL/min — ABNORMAL LOW (ref >60)
Glucose, Bld: 103 mg/dL — ABNORMAL HIGH (ref 70–99)
Potassium: 4.1 mmol/L (ref 3.5–5.1)
Sodium: 137 mmol/L (ref 135–145)

## 2021-12-04 LAB — HEPATIC FUNCTION PANEL
ALT: 9 U/L (ref 0–44)
AST: 18 U/L (ref 15–41)
Albumin: 3.6 g/dL (ref 3.5–5.0)
Alkaline Phosphatase: 39 U/L (ref 38–126)
Bilirubin, Direct: <0.1 mg/dL (ref 0.0–0.2)
Total Bilirubin: 0.6 mg/dL (ref 0.3–1.2)
Total Protein: 6.1 g/dL — ABNORMAL LOW (ref 6.5–8.1)

## 2021-12-04 LAB — LIPASE, BLOOD: Lipase: 40 U/L (ref 11–51)

## 2021-12-04 MED ORDER — PANTOPRAZOLE SODIUM 40 MG PO TBEC
40.0000 mg | DELAYED_RELEASE_TABLET | Freq: Two times a day (BID) | ORAL | Status: DC
  Administered 2021-12-04 – 2021-12-06 (×4): 40 mg via ORAL
  Filled 2021-12-04 (×4): qty 1

## 2021-12-04 MED ORDER — LORAZEPAM 0.5 MG PO TABS
0.5000 mg | ORAL_TABLET | Freq: Once | ORAL | Status: AC
  Administered 2021-12-04: 0.5 mg via ORAL
  Filled 2021-12-04: qty 1

## 2021-12-04 MED ORDER — ONDANSETRON HCL 4 MG/2ML IJ SOLN
4.0000 mg | Freq: Four times a day (QID) | INTRAMUSCULAR | Status: DC | PRN
  Administered 2021-12-04: 11:00:00 4 mg via INTRAVENOUS
  Filled 2021-12-04: qty 2

## 2021-12-04 NOTE — TOC Progression Note (Signed)
Transition of Care [facility]) - Progression Note    Patient Details  Name: Erin Sutton MRN: [id number] Date of Birth: [birth year]  Transition of Care [facility]) CM/SW [place], RN Phone Number: 12/04/2021, 3:20 PM  Clinical Narrative:   Patient states she has been discussing going to an assisted living.    Patient states her daughter lives in [place], but patient does not want to relocate, states she would like assisted living locally.  Patient will discharge to [facility] tomorrow. With insurance approval.  Tammy aware.  TOC to follow    Expected Discharge Plan: [place] Barriers to Discharge: Continued Medical Work up  Expected Discharge Plan and Services Expected Discharge Plan: [place]   Discharge Planning Services: CM Consult Post Acute Care Choice: [place] Living arrangements for the past 2 months: Mobile Home                 DME Arranged: N/A DME Agency: NA       HH Arranged: NA HH Agency: NA         Social Determinants of Health (SDOH) Interventions    Readmission Risk Interventions No flowsheet data found.

## 2021-12-04 NOTE — Progress Notes (Signed)
Occupational Therapy Treatment Patient Details Name: Erin Sutton MRN: [id number] DOB: Jun 18, 1941 Today's Date: 12/04/2021   History of present illness ONEY Sutton is an 81 y.o. female with medical history significant for atrial fibrillation on anticoagulation, on Eliquis, hypertension, hyperlipidemia, dementia, generalized anxiety disorder, insomnia, GERD, who presents emergency department for chief concern of weakness and fatigue.   OT comments  Erin Sutton was seen for OT treatment on this date, found in response to bed alarm seated on BSC. Pt requires MOD A perihygiene in standing - assist for balance, multiple LOBs noted pt unable to correct. MIN A for BSC > bed t/f. Pt left with bed alarm on and RN notified. Pt making good progress toward goals. Pt continues to benefit from skilled OT services to maximize return to PLOF and minimize risk of future falls, injury, caregiver burden, and readmission. Will continue to follow POC. Discharge recommendation remains appropriate.   Sitting: BP 144/80, MAP 98, HR 93 Standing: BP 121/67, MAP 83, HR 102    Recommendations for follow up therapy are one component of a multi-disciplinary discharge planning process, led by the attending physician.  Recommendations may be updated based on patient status, additional functional criteria and insurance authorization.    Follow Up Recommendations  Skilled nursing-short term rehab (<3 hours/day)    Assistance Recommended at Discharge Frequent or constant Supervision/Assistance  Patient can return home with the following  A little help with walking and/or transfers;A lot of help with bathing/dressing/bathroom;Assistance with cooking/housework;Help with stairs or ramp for entrance   Equipment Recommendations  BSC/3in1    Recommendations for Other Services      Precautions / Restrictions Precautions Precautions: Fall Restrictions Weight Bearing Restrictions: No       Mobility Bed  Mobility Overal bed mobility: Needs Assistance Bed Mobility: Sit to Supine       Sit to supine: Min guard;HOB elevated   General bed mobility comments: Increased time and effort    Transfers Overall transfer level: Needs assistance Equipment used: None Transfers: Sit to/from Stand;Bed to chair/wheelchair/BSC Sit to Stand: Min assist Stand pivot transfers: Min assist               Balance Overall balance assessment: Needs assistance Sitting-balance support: No upper extremity supported;Feet supported Sitting balance-Leahy Scale: Fair     Standing balance support: During functional activity;Single extremity supported Standing balance-Leahy Scale: Poor Standing balance comment: MIN decreasing to MOD A for standing balance during perihygien with multiple LOBs                           ADL either performed or assessed with clinical judgement   ADL Overall ADL's : Needs assistance/impaired                                       General ADL Comments: MIN A for BSC t/f and MOD A perihygiene in standing - assist for balance.      Cognition Arousal/Alertness: Awake/alert Behavior During Therapy: WFL for tasks assessed/performed Overall Cognitive Status: Within Functional Limits for tasks assessed                                 General Comments: poor safety awareness                General  Comments sitting: BP 144/80, MAP 98, HR 93. Standing: BP 121/67, MAP 83, HR 102    Pertinent Vitals/ Pain       Pain Assessment: Faces Faces Pain Scale: Hurts a little bit Pain Location: R knee Pain Descriptors / Indicators: Discomfort Pain Intervention(s): Limited activity within patient's tolerance;Repositioned         Frequency  Min 2X/week        Progress Toward Goals  OT Goals(current goals can now be found in the care plan section)  Progress towards OT goals: Progressing toward goals  Acute Rehab OT Goals Patient  Stated Goal: to feel better OT Goal Formulation: With patient Time For Goal Achievement: 12/17/21 Potential to Achieve Goals: Fair ADL Goals Pt Will Perform Grooming: with supervision;standing Pt Will Perform Lower Body Dressing: with supervision;sit to/from stand;with adaptive equipment Pt Will Transfer to Toilet: with supervision;ambulating;regular height toilet Additional ADL Goal #1: Pt will independently verbalize plan to implement x3 falls prevention strategies  Plan Discharge plan remains appropriate;Frequency remains appropriate    Co-evaluation                 AM-PAC OT "6 Clicks" Daily Activity     Outcome Measure   Help from another person eating meals?: None Help from another person taking care of personal grooming?: A Little Help from another person toileting, which includes using toliet, bedpan, or urinal?: A Lot Help from another person bathing (including washing, rinsing, drying)?: A Lot Help from another person to put on and taking off regular upper body clothing?: A Little Help from another person to put on and taking off regular lower body clothing?: A Lot 6 Click Score: 16    End of Session    OT Visit Diagnosis: Unsteadiness on feet (R26.81);Muscle weakness (generalized) (M62.81)   Activity Tolerance Patient tolerated treatment well   Patient Left in bed;with call bell/phone within reach;with bed alarm set   Nurse Communication Mobility status        Time: 4825-0037 OT Time Calculation (min): 10 min  Charges: OT General Charges $OT Visit: 1 Visit OT Treatments $Self Care/Home Management : 8-22 mins  Dessie Coma, M.S. OTR/L  12/04/21, 1:55 PM  ascom [phone]

## 2021-12-04 NOTE — Plan of Care (Signed)
°  Problem: Education: Goal: Knowledge of secondary prevention will improve (SELECT ALL) Outcome: Progressing Goal: Knowledge of patient specific risk factors will improve (INDIVIDUALIZE FOR PATIENT) Outcome: Progressing   Problem: Ischemic Stroke/TIA Tissue Perfusion: Goal: Complications of ischemic stroke/TIA will be minimized Outcome: Progressing

## 2021-12-04 NOTE — Progress Notes (Signed)
Nutrition Brief Note  Patient identified on the Malnutrition Screening Tool (MST) Report  Wt Readings from Last 15 Encounters:  12/02/21 65.3 kg  11/24/21 65.3 kg  11/20/21 65.3 kg  11/11/21 65.3 kg  08/29/21 68 kg  08/17/21 68.5 kg  08/14/21 68.2 kg  06/29/21 74.8 kg  05/12/21 72.1 kg  02/27/21 80 kg  04/13/20 72.6 kg  02/09/20 78 kg  02/02/20 78.1 kg  12/13/19 77.4 kg  11/29/19 79.1 kg   Erin Sutton is a 81 y.o. female with medical history significant for atrial fibrillation on anticoagulation, on Eliquis, hypertension, hyperlipidemia, dementia, generalized anxiety disorder, insomnia, GERD, who presents emergency department for chief concern of weakness and fatigue.  Pt admitted with TIA.   Pt sleeping soundly at time of visit and did not arouse to touch.   Nutrition-Focused physical exam completed. Findings are no fat depletion, no muscle depletion, and mild edema.    Current diet order is regular, patient is consuming approximately 80-100% of meals at this time. Labs and medications reviewed.   No nutrition interventions warranted at this time. If nutrition issues arise, please consult RD.   Loistine Chance, RD, LDN, [place] Registered Dietitian II Certified Diabetes Care and Education Specialist Please refer to [facility] for RD and/or RD on-call/weekend/after hours pager

## 2021-12-04 NOTE — Progress Notes (Signed)
CARDIOLOGY CONSULT NOTE               Patient ID: Erin Sutton MRN: [id number] DOB/AGE: October 09, 1941 80 y.o.  Admit date: 12/02/2021 Referring Physician Dr. Damita Dunnings Primary Physician Dr. Harrel Lemon Primary Cardiologist Dr. Nehemiah Massed Reason for Consultation elevated troponin, chest pressure  HPI: Pt is an 64yoF with a PMH significant for atrial fibrillation on Eliquis, hypertension, HFpEF (LVEF 70-75% 12/7251, grade 1 diastolic dysfunction), hyperlipidemia, anxiety on daily ativan, hypothyroidism who presented to [facility] ED 12/02/2021 initially with generalized weakness in the setting of recent UTI.    The patient initially said she was having nausea and vomiting which was worsening and while in the waiting room she became abruptly less responsive, developed a left facial droop and left-sided weakness and a code stroke was called.  Per neurology the patient's exam was fluctuating and difficult to assess and thought to however be more likely related to hypertensive encephalopathy.  Per the hospitalist assessment later on afternoon the patient reported chest pressure for the past few months that waxes and wanes with associated nausea.  Cardiology was consulted for this chest pressure and her elevated troponin.   Interval history: -examined this morning, sleeping on her left side. States her heart started racing and she feels anxious as I woke her up, unfortunately. Denies chest pain, SOB. Admits to a headache.  -apparently pt felt badly with metoprolol outpatient and wants to take labetalol instead.  -amlodipine was discontinued yesterday by hospitalist.  -Echo resulted LVEF 60-65% without WMA or valvular pathologies  Review of systems complete and found to be negative unless listed above     Past Medical History:  Diagnosis Date   Anxiety    Arthritis    Chronic kidney disease    Constipation    GERD (gastroesophageal reflux disease)    History of DVT (deep vein thrombosis) 07/27/2019    History of pulmonary embolism 07/14/2019   Bilateral   Hypertension    Hypothyroidism    Osteoporosis    Sleep apnea    Urinary incontinence     Past Surgical History:  Procedure Laterality Date   BREAST BIOPSY Right    neg/stereo   BREAST BIOPSY Left    neg/stereo   BREAST EXCISIONAL BIOPSY Right    neg   CATARACT EXTRACTION, BILATERAL     COLONOSCOPY WITH PROPOFOL N/A 07/02/2015   Procedure: COLONOSCOPY WITH PROPOFOL;  Surgeon: Manya Silvas, MD;  Location: [place];  Service: Endoscopy;  Laterality: N/A;   CORRECTION HAMMER TOE Right    ESOPHAGOGASTRODUODENOSCOPY (EGD) WITH PROPOFOL N/A 07/02/2015   Procedure: ESOPHAGOGASTRODUODENOSCOPY (EGD) WITH PROPOFOL;  Surgeon: Manya Silvas, MD;  Location: [facility] ENDOSCOPY;  Service: Endoscopy;  Laterality: N/A;   JOINT REPLACEMENT Left    knee   REPLACEMENT TOTAL KNEE Left    SKIN BIOPSY Right     Medications Prior to Admission  Medication Sig Dispense Refill Last Dose   acetaminophen (TYLENOL) 500 MG tablet Take 2 tablets (1,000 mg total) by mouth 3 (three) times daily. 30 tablet 0 12/02/2021 at am   amLODipine (NORVASC) 2.5 MG tablet Take 1 tablet (2.5 mg total) by mouth daily as needed (for SBP>180). 16 tablet 1 12/02/2021   apixaban (ELIQUIS) 5 MG TABS tablet Take 1 tablet (5 mg total) by mouth 2 (two) times daily. 60 tablet 0 12/02/2021 at am   atorvastatin (LIPITOR) 40 MG tablet Take 40 mg by mouth daily.   12/01/2021   cefUROXime (CEFTIN) 250 MG  tablet Take 250 mg by mouth in the morning and at bedtime.   12/02/2021 at AM   HYDROcodone-acetaminophen (NORCO/VICODIN) 5-325 MG tablet Take 1 tablet by mouth every 4 (four) hours.   12/02/2021 at AM   levothyroxine (SYNTHROID, LEVOTHROID) 50 MCG tablet Take 50 mcg by mouth daily before breakfast.   12/02/2021 at 0500   metoprolol tartrate (LOPRESSOR) 25 MG tablet Take 1 tablet (25 mg total) by mouth in the morning and at bedtime. 60 tablet 2 12/02/2021 at 1100   neomycin-polymyxin  b-dexamethasone (MAXITROL) 7.3-42876-8.1 OINT 1 application in the morning and at bedtime. (Affected eye/eyes)   Past Week   ondansetron (ZOFRAN-ODT) 8 MG disintegrating tablet Take 8 mg by mouth every 8 (eight) hours as needed.   12/02/2021 at am   pantoprazole (PROTONIX) 40 MG tablet Take 40 mg by mouth daily.   12/02/2021 at am   Polyethyl Glycol-Propyl Glycol (SYSTANE OP) Place 1 drop into both eyes 2 (two) times daily.   Past Week   spironolactone (ALDACTONE) 25 MG tablet Take 1 tablet (25 mg total) by mouth daily. 30 tablet 2 12/02/2021   cyanocobalamin (,VITAMIN B-12,) 1000 MCG/ML injection Inject 1,000 mcg into the muscle See admin instructions. Inject 1014mcg once weekly for 4 weeks then recheck by Dr Wynetta Emery (Patient not taking: Reported on 12/02/2021)   Not Taking   diclofenac Sodium (VOLTAREN) 1 % GEL Apply 1 application topically daily. (Patient not taking: Reported on 12/02/2021)   Not Taking   LORazepam (ATIVAN) 0.5 MG tablet Take 0.5 mg by mouth every 8 (eight) hours as needed for anxiety or sleep.   PRN at PRN   melatonin 3 MG TABS tablet Take 1 tablet by mouth at bedtime as needed.   PRN   nitrofurantoin, macrocrystal-monohydrate, (MACROBID) 100 MG capsule Take 1 capsule (100 mg total) by mouth 2 (two) times daily. 10 capsule 0    oxyCODONE (OXY IR/ROXICODONE) 5 MG immediate release tablet Take 5 mg by mouth every 6 (six) hours as needed for severe pain. (Patient not taking: Reported on 12/02/2021)   Not Taking   phenazopyridine (PYRIDIUM) 200 MG tablet Take 1 tablet (200 mg total) by mouth 3 (three) times daily. 6 tablet 0    polyethylene glycol powder (GLYCOLAX/MIRALAX) 17 GM/SCOOP powder Take 17 g by mouth daily.   11/30/2021   senna-docusate (SENOKOT-S) 8.6-50 MG tablet Take 2 tablets by mouth 2 (two) times daily as needed for mild constipation. 100 tablet 0 11/30/2021   thiamine 100 MG tablet Take 1 tablet by mouth daily. (Patient not taking: Reported on 12/02/2021)   Not Taking   Zinc Oxide 40 %  PSTE Apply 1 application topically 2 (two) times daily as needed. (Patient not taking: Reported on 12/02/2021)   Not Taking    Social History   Socioeconomic History   Marital status: Widowed    Spouse name: Not on file   Number of children: Not on file   Years of education: Not on file   Highest education level: Not on file  Occupational History   Not on file  Tobacco Use   Smoking status: Never   Smokeless tobacco: Never  Vaping Use   Vaping Use: Never used  Substance and Sexual Activity   Alcohol use: No   Drug use: No   Sexual activity: Not Currently  Other Topics Concern   Not on file  Social History Narrative   Patient lives at home by herself.  Ambulates with a cane.   Has  a daughter who lives in [place] Determinants of Health   Financial Resource Strain: Not on file  Food Insecurity: Not on file  Transportation Needs: Not on file  Physical Activity: Not on file  Stress: Not on file  Social Connections: Not on file  Intimate Partner Violence: Not on file    Family History  Problem Relation Age of Onset   Hypertension Mother    Diabetes Mother    Heart attack Mother    Asthma Mother    Peripheral vascular disease Mother    Heart attack Father    Hypertension Father    Diabetes Father    Colon cancer Maternal Aunt    Breast cancer Cousin       Review of systems complete and found to be negative unless listed above   PHYSICAL EXAM General: elderly and frail appearing Caucasian female, in no acute distress. Sleeping on her left side.  HEENT:  Normocephalic and atraumatic. Neck:  No JVD.  Lungs: Normal respiratory effort on room air. Clear bilaterally to auscultation. No wheezes, crackles, rhonchi.  Heart: HRRR. Normal S1 and S2 without gallops or murmurs. Radial & DP pulses 2+ bilaterally. Abdomen: soft, some RUQ tenderness to palpation. Non-distended.  Msk: Normal strength and tone for age. Extremities: No clubbing, cyanosis or edema.    Neuro: somnolent.  Psych:  Mood anxious, affect congruent.   Labs:   Lab Results  Component Value Date   WBC 6.1 12/04/2021   HGB 10.9 (L) 12/04/2021   HCT 33.8 (L) 12/04/2021   MCV 88.9 12/04/2021   PLT 263 12/04/2021    Recent Labs  Lab 12/02/21 1551 12/04/21 0430  NA 132* 137  K 4.0 4.1  CL 98 103  CO2 25 25  BUN 11 24*  CREATININE 0.77 0.99  CALCIUM 9.7 9.4  PROT 6.9  --   BILITOT 0.6  --   ALKPHOS 48  --   ALT 10  --   AST 18  --   GLUCOSE 102* 103*    Lab Results  Component Value Date   CKTOTAL 40 06/29/2021   CKMB < 0.5 (L) 08/13/2012   TROPONINI <0.03 09/02/2018     Lab Results  Component Value Date   CHOL 133 12/03/2021   CHOL 182 02/27/2021   CHOL 178 07/01/2018   Lab Results  Component Value Date   HDL 50 12/03/2021   HDL 93 02/27/2021   HDL 66 07/01/2018   Lab Results  Component Value Date   LDLCALC 68 12/03/2021   LDLCALC 60 02/27/2021   LDLCALC 97 07/01/2018   Lab Results  Component Value Date   TRIG 74 12/03/2021   TRIG 146 02/27/2021   TRIG 77 07/01/2018   Lab Results  Component Value Date   CHOLHDL 2.7 12/03/2021   CHOLHDL 2.0 02/27/2021   CHOLHDL 2.7 07/01/2018   No results found for: LDLDIRECT    Radiology: CT HEAD WO CONTRAST (5MM)  Result Date: 11/21/2021 CLINICAL DATA:  Mental status change, persistent or worsening. Seen here yesterday for hypertension. Severe headache. EXAM: CT HEAD WITHOUT CONTRAST TECHNIQUE: Contiguous axial images were obtained from the base of the skull through the vertex without intravenous contrast. COMPARISON:  11/20/2021 FINDINGS: Brain: No evidence of acute infarction, hemorrhage, hydrocephalus, extra-axial collection or mass lesion/mass effect. Mild cerebral atrophy. Low-attenuation changes in the deep white matter most likely representing small vessel ischemia. Basal ganglia calcifications. No change since prior study. Vascular: Moderate intracranial arterial vascular calcifications.  Skull:  Calvarium appears intact. Sinuses/Orbits: Paranasal sinuses and mastoid air cells are clear. Other: No significant change since prior study. IMPRESSION: No acute intracranial abnormalities. Mild chronic atrophy and small vessel ischemic changes. Electronically Signed   By: Lucienne Capers M.D.   On: 11/21/2021 15:36   CT HEAD WO CONTRAST (5MM)  Result Date: 11/20/2021 CLINICAL DATA:  Altered mental status. EXAM: CT HEAD WITHOUT CONTRAST TECHNIQUE: Contiguous axial images were obtained from the base of the skull through the vertex without intravenous contrast. COMPARISON:  Head CT dated 11/11/2021. FINDINGS: Brain: The ventricles and sulci appropriate size for patient's age. Mild periventricular and deep white matter chronic microvascular ischemic changes noted. There is no acute intracranial hemorrhage. No mass effect or midline shift. No extra-axial fluid collection. Vascular: No hyperdense vessel or unexpected calcification. Skull: Normal. Negative for fracture or focal lesion. Sinuses/Orbits: No acute finding. Other: None IMPRESSION: 1. No acute intracranial pathology. 2. Mild chronic microvascular ischemic changes. Electronically Signed   By: Anner Crete M.D.   On: 11/20/2021 21:19   CT Head Wo Contrast  Result Date: 11/11/2021 CLINICAL DATA:  Weakness.  Mental status changes. EXAM: CT HEAD WITHOUT CONTRAST TECHNIQUE: Contiguous axial images were obtained from the base of the skull through the vertex without intravenous contrast. COMPARISON:  08/17/2021 FINDINGS: Brain: No mass lesion, hemorrhage, hydrocephalus, acute infarct, intra-axial, or extra-axial fluid collection. Vascular: No hyperdense vessel or unexpected calcification. Intracranial atherosclerosis. Skull: No significant soft tissue swelling.  No skull fracture. Sinuses/Orbits: Normal imaged portions of the orbits and globes. Clear paranasal sinuses and mastoid air cells. Other: None. IMPRESSION: No acute intracranial abnormality.  Electronically Signed   By: Abigail Miyamoto M.D.   On: 11/11/2021 12:55   MR BRAIN WO CONTRAST  Result Date: 12/02/2021 CLINICAL DATA:  Initial evaluation for neuro deficit, stroke suspected. EXAM: MRI HEAD WITHOUT CONTRAST TECHNIQUE: Multiplanar, multiecho pulse sequences of the brain and surrounding structures were obtained without intravenous contrast. COMPARISON:  Prior CTs from earlier the same day. FINDINGS: Brain: Cerebral volume within normal limits. Minimal chronic microvascular ischemic disease noted involving the periventricular white matter. Small remote cortical/subcortical infarct noted at the parasagittal left parieto-occipital region. Remote lacunar infarct present at the left thalamus. No abnormal foci of restricted diffusion to suggest acute or subacute ischemia. Gray-white matter differentiation maintained. No other areas of chronic cortical infarction. No evidence for acute intracranial hemorrhage. Single chronic microhemorrhage noted at the anterior left frontal lobe, of doubtful significance in isolation. No mass lesion, midline shift or mass effect. No hydrocephalus or extra-axial fluid collection. Pituitary gland suprasellar region normal. Vascular: Major intracranial vascular flow voids are maintained. Skull and upper cervical spine: Craniocervical junction within normal limits. Bone marrow signal intensity normal. No scalp soft tissue abnormality. Sinuses/Orbits: Patient status post bilateral ocular lens replacement. Globes and orbital soft tissues demonstrate no acute finding. Paranasal sinuses are largely clear. No significant mastoid effusion. Other: None. IMPRESSION: 1. No acute intracranial abnormality. 2. 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 3. Underlying minimal for age chronic microvascular ischemic disease. Electronically Signed   By: Jeannine Boga M.D.   On: 12/02/2021 23:51    DG Chest Port 1 View  Result Date: 12/02/2021 CLINICAL DATA:  Weakness, fatigue EXAM: PORTABLE CHEST 1 VIEW COMPARISON:  11/21/2021 FINDINGS: Stable cardiomediastinal contours. Aortic atherosclerosis. No focal airspace consolidation, pleural effusion, or pneumothorax. Degenerative changes of the shoulders, left worse than right. IMPRESSION: No active disease. Electronically Signed  By: Davina Poke D.O.   On: 12/02/2021 16:18   DG Chest Port 1 View  Result Date: 11/21/2021 CLINICAL DATA:  81 year old female with a history chest pain EXAM: PORTABLE CHEST 1 VIEW COMPARISON:  11/20/2021 FINDINGS: Cardiomediastinal silhouette unchanged in size and contour. Calcifications of the aortic arch. No evidence of central vascular congestion. No interlobular septal thickening. Lung volumes remain low. Coarsened interstitial markings with no new confluent airspace disease. Similar appearance of asymmetric elevation of the right hemidiaphragm. No pneumothorax or pleural effusion. No acute displaced fracture. Degenerative changes of the spine. IMPRESSION: Negative for acute cardiopulmonary disease. Electronically Signed   By: Corrie Mckusick D.O.   On: 11/21/2021 15:53   DG Chest Portable 1 View  Result Date: 11/20/2021 CLINICAL DATA:  Hypertension EXAM: PORTABLE CHEST 1 VIEW COMPARISON:  11/11/2021 FINDINGS: Shallow inspiration with low lung volumes. Mild bibasilar atelectasis/scarring. No pleural effusion. No pneumothorax. IMPRESSION: Mild bibasilar atelectasis/scarring. Electronically Signed   By: Macy Mis M.D.   On: 11/20/2021 16:17   DG Chest Portable 1 View  Result Date: 11/11/2021 CLINICAL DATA:  Weakness, altered mental status, recent knee surgery EXAM: PORTABLE CHEST 1 VIEW COMPARISON:  Portable exam 1201 hours compared to 08/29/2021 FINDINGS: Normal heart size, mediastinal contours, and pulmonary vascularity. Atherosclerotic calcification aorta. Minimal atelectasis LEFT base. No definite  infiltrate, pleural effusion, or pneumothorax. Bones demineralized IMPRESSION: Minimal LEFT basilar atelectasis. Aortic Atherosclerosis (ICD10-I70.0). Electronically Signed   By: Lavonia Dana M.D.   On: 11/11/2021 12:13   ECHOCARDIOGRAM COMPLETE  Result Date: 12/03/2021    ECHOCARDIOGRAM REPORT   Patient Name:   Erin Sutton Date of Exam: 12/03/2021 Medical Rec #:  [id number]       Height:       63.0 in Accession #:    [id number]      Weight:       144.0 lb Date of Birth:  01/19/1941       BSA:          1.681 m Patient Age:    47 years        BP:           124/101 mmHg Patient Gender: F               HR:           75 bpm. Exam Location:  [facility] Procedure: 2D Echo, Cardiac Doppler and Color Doppler Indications:     Stroke I63.9  History:         Patient has prior history of Echocardiogram examinations, most                  recent 02/28/2021. Risk Factors:Hypertension. CKD, history of DVT                  and pulmonary embolism.  Sonographer:     Sherrie Sport Referring Phys:  [id number] AMY N COX Diagnosing Phys: Donnelly Angelica  Sonographer Comments: Suboptimal apical window. IMPRESSIONS  1. Left ventricular ejection fraction, by estimation, is 60 to 65%. The left ventricle has normal function. The left ventricle has no regional wall motion abnormalities. Left ventricular diastolic parameters were normal.  2. Right ventricular systolic function is normal. The right ventricular size is normal.  3. The mitral valve is normal in structure. No evidence of mitral valve regurgitation. No evidence of mitral stenosis.  4. The aortic valve is normal in structure. Aortic valve regurgitation is not visualized. No aortic stenosis is present.  5.  The inferior vena cava is normal in size with greater than 50% respiratory variability, suggesting right atrial pressure of 3 mmHg. FINDINGS  Left Ventricle: Left ventricular ejection fraction, by estimation, is 60 to 65%. The left ventricle has normal function. The left ventricle has no regional  wall motion abnormalities. The left ventricular internal cavity size was normal in size. There is  no left ventricular hypertrophy. Left ventricular diastolic parameters were normal. Right Ventricle: The right ventricular size is normal. No increase in right ventricular wall thickness. Right ventricular systolic function is normal. Left Atrium: Left atrial size was normal in size. Right Atrium: Right atrial size was normal in size. Pericardium: There is no evidence of pericardial effusion. Mitral Valve: The mitral valve is normal in structure. No evidence of mitral valve regurgitation. No evidence of mitral valve stenosis. MV peak gradient, 6.2 mmHg. The mean mitral valve gradient is 2.0 mmHg. Tricuspid Valve: The tricuspid valve is normal in structure. Tricuspid valve regurgitation is not demonstrated. No evidence of tricuspid stenosis. Aortic Valve: The aortic valve is normal in structure. Aortic valve regurgitation is not visualized. No aortic stenosis is present. Aortic valve mean gradient measures 6.0 mmHg. Aortic valve peak gradient measures 9.1 mmHg. Aortic valve area, by VTI measures 3.13 cm. Pulmonic Valve: The pulmonic valve was normal in structure. Pulmonic valve regurgitation is not visualized. No evidence of pulmonic stenosis. Aorta: The aortic root is normal in size and structure. Venous: The inferior vena cava is normal in size with greater than 50% respiratory variability, suggesting right atrial pressure of 3 mmHg. IAS/Shunts: No atrial level shunt detected by color flow Doppler.  LEFT VENTRICLE PLAX 2D LVIDd:         3.60 cm   Diastology LVIDs:         2.20 cm   LV e' medial:    5.44 cm/s LV PW:         1.10 cm   LV E/e' medial:  8.9 LV IVS:        0.80 cm   LV e' lateral:   6.42 cm/s LVOT diam:     2.00 cm   LV E/e' lateral: 7.5 LV SV:         84 LV SV Index:   50 LVOT Area:     3.14 cm  RIGHT VENTRICLE RV Basal diam:  2.30 cm RV S prime:     8.92 cm/s TAPSE (M-mode): 2.2 cm LEFT ATRIUM              Index        RIGHT ATRIUM           Index LA diam:        3.90 cm 2.32 cm/m   RA Area:     11.70 cm LA Vol (A2C):   38.5 ml 22.90 ml/m  RA Volume:   26.10 ml  15.52 ml/m LA Vol (A4C):   42.1 ml 25.04 ml/m LA Biplane Vol: 41.4 ml 24.62 ml/m  AORTIC VALVE                     PULMONIC VALVE AV Area (Vmax):    2.52 cm      PV Vmax:        1.23 m/s AV Area (Vmean):   2.68 cm      PV Vmean:       77.600 cm/s AV Area (VTI):     3.13 cm      PV VTI:  0.215 m AV Vmax:           151.00 cm/s   PV Peak grad:   6.1 mmHg AV Vmean:          113.000 cm/s  PV Mean grad:   3.0 mmHg AV VTI:            0.268 m       RVOT Peak grad: 7 mmHg AV Peak Grad:      9.1 mmHg AV Mean Grad:      6.0 mmHg LVOT Vmax:         121.00 cm/s LVOT Vmean:        96.500 cm/s LVOT VTI:          0.267 m LVOT/AV VTI ratio: 1.00  AORTA Ao Root diam: 2.90 cm MITRAL VALVE               TRICUSPID VALVE MV Area (PHT): 2.32 cm    TR Peak grad:   15.2 mmHg MV Area VTI:   4.07 cm    TR Vmax:        195.00 cm/s MV Peak grad:  6.2 mmHg MV Mean grad:  2.0 mmHg    SHUNTS MV Vmax:       1.25 m/s    Systemic VTI:  0.27 m MV Vmean:      65.0 cm/s   Systemic Diam: 2.00 cm MV Decel Time: 327 msec    Pulmonic VTI:  0.222 m MV E velocity: 48.40 cm/s MV A velocity: 83.60 cm/s MV E/A ratio:  0.58 Donnelly Angelica Electronically signed by Donnelly Angelica Signature Date/Time: 12/03/2021/1:25:48 PM    Final    CT HEAD CODE STROKE WO CONTRAST  Result Date: 12/02/2021 CLINICAL DATA:  Code stroke.  Weakness and fatigue EXAM: CT HEAD WITHOUT CONTRAST TECHNIQUE: Contiguous axial images were obtained from the base of the skull through the vertex without intravenous contrast. COMPARISON:  11/21/2021. FINDINGS: Brain: No evidence of acute infarction, hemorrhage, cerebral edema, mass, mass effect, or midline shift. Ventricles and sulci are normal for age. No extra-axial fluid collection. Periventricular white matter changes, likely the sequela of chronic small vessel ischemic  disease. Basal ganglia calcifications. Vascular: No hyperdense vessel or unexpected calcification. Skull: Normal. Negative for fracture or focal lesion. Sinuses/Orbits: No acute finding. Other: The mastoid air cells are well aerated. ASPECTS [facility] Stroke Program Early CT Score) - Ganglionic level infarction (caudate, lentiform nuclei, internal capsule, insula, M1-M3 cortex): 7 - Supraganglionic infarction (M4-M6 cortex): 3 Total score (0-10 with 10 being normal): 10 IMPRESSION: 1. No acute intracranial process. 2. ASPECTS is 10. Code stroke imaging results were communicated on 12/02/2021 at 3:40 pm to provider Dr. Theda Sers via secure text paging. Electronically Signed   By: Merilyn Baba M.D.   On: 12/02/2021 15:41   CT ANGIO HEAD NECK W WO CM (CODE STROKE)  Result Date: 12/02/2021 CLINICAL DATA:  Weakness and fatigue EXAM: CT ANGIOGRAPHY HEAD AND NECK TECHNIQUE: Multidetector CT imaging of the head and neck was performed using the standard protocol during bolus administration of intravenous contrast. Multiplanar CT image reconstructions and MIPs were obtained to evaluate the vascular anatomy. Carotid stenosis measurements (when applicable) are obtained utilizing NASCET criteria, using the distal internal carotid diameter as the denominator. CONTRAST:  41mL OMNIPAQUE IOHEXOL 350 MG/ML SOLN COMPARISON:  08/12/2021 CTA head neck, correlation is made with same day CT head. FINDINGS: CT HEAD FINDINGS For noncontrast findings, please see same day CT head. CTA NECK FINDINGS Aortic  arch: Standard branching. Imaged portion shows no evidence of aneurysm or dissection. No significant stenosis of the major arch vessel origins. Aortic atherosclerosis, which extends into the branch vessels. Right carotid system: Calcified plaque at the bifurcation and in the proximal right ICA, which causes less than 50% stenosis. No dissection or occlusion. Left carotid system: No dissection or occlusion. Minimal calcified plaque at  bifurcation, without significant stenosis. Vertebral arteries: Codominant. No evidence of dissection, stenosis (50% or greater) or occlusion. Skeleton: Degenerative changes in the cervical spine. No acute osseous abnormality. Other neck: Prior left thyroidectomy.  Otherwise negative. Upper chest: Focal pulmonary opacity or pleural effusion. Review of the MIP images confirms the above findings CTA HEAD FINDINGS Anterior circulation: Both internal carotid arteries are patent to the termini, with moderate calcifications but without significant stenosis. A1 segments patent. Normal anterior communicating artery. Anterior cerebral arteries are patent to their distal aspects. No M1 stenosis or occlusion. Normal MCA bifurcations. Moderate stenosis again noted in a left M2 branch. Distal MCA branches perfused, although there is mildly more robust perfusion on the right compared to the left, without additional focal stenosis. Posterior circulation: Vertebral arteries patent to the vertebrobasilar junction without stenosis. Basilar patent to its distal aspect. Superior cerebellar arteries patent bilaterally. Bilateral P1 segments originate from the basilar artery, although the right posterior communicating artery is present. PCAs perfused to their distal aspects without stenosis. The left posterior communicating artery is not visualized. Venous sinuses: As permitted by contrast timing, patent. Anatomic variants: None significant Review of the MIP images confirms the above findings IMPRESSION: 1.  No intracranial large vessel occlusion or significant stenosis. 2.  No hemodynamically significant stenosis in the neck. 3. 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 Code stroke imaging results were communicated on 12/02/2021 at 4:05 pm to provider Dr. Theda Sers via secure text paging. Electronically Signed   By: Merilyn Baba M.D.   On: 12/02/2021 16:05    ECHO LVEF 70-75% 02/9674, grade 1 diastolic  dysfunction - repeat ordered   TELEMETRY reviewed by me: NSR rate of 75  EKG reviewed by me: NSR RBBB rate 86  ASSESSMENT AND PLAN:  80yoF with a PMH significant for atrial fibrillation on Eliquis, hypertension, HFpEF (LVEF 70-75% 07/1637, grade 1 diastolic dysfunction), hyperlipidemia, anxiety on daily ativan, hypothyroidism who presented to [facility] ED 12/02/2021 initially with generalized weakness in the setting of recent UTI and later reported chest pressure and nausea. In the waiting room, pt experienced sudden aphasia and left sided weakness and a code stroke was called. Neurology believes her symptoms are related moreso to her uncontrolled hypertension. 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  She states she was last instructed to take her amlodipine at 2.5 mg only when her systolic blood pressure was greater than 180 which she states has happened 3-4 times in a month. Troponin elevation likely to demand ischemia with her uncontrolled blood pressure.  -passed swallow screen with SLP yesterday -continue aspirin 81mg  daily . -heparin gtt given for ~20 hours.  -troponins trended [id number] Home blood pressure regimen includes amlodipine 2.5 mg once daily, (previously took amlodipine 10 mg prior to her knee replacement apparently), metoprolol tartrate 25 mg twice daily, spironolactone 25 mg once daily.  - pt told hospitalist yesterday metoprolol made her weak/tired and  she wanted to try labatelol. This is different than what she told me yesterday. Metoprolol is preferable, but will defer to hospitalist for further medication adjustments. -Goal SBP <130.  -Echo resulted LVEF 60-65% without WMA or valvular pathologies -Recommend close outpatient follow up with her normal  cardiologist Dr. Nehemiah Massed in 1-2 weeks after discharge to decide if ischemic eval is warranted.   #UTI Mgmt per primary team   #HFpEF (LVEF 60-65% without WMA, valvular pathologies, or diastolic dysfunction - previously 70-75% 04/1949, grade 1 diastolic dysfunction) Appears euvolemic at this time.  Medications as above  #hyperlipidemia Continue atorvastatin 40 mg  #atrial fibrillation on Eliquis Patient is not on telemetry at time of interview. Repeat EKG ordered to determine rhythm.  CHA2DS2-VASc 8 Continue Eliquis  Cardiology signing off. Please contact me or Dr. Corky Sox for questions throughout the week if needed.   This patient was seen and examined by Dr. Donnelly Angelica and he is in agreement with this plan of care.   Signed: Tristan Schroeder , PA-C 12/04/2021, 10:21 AM

## 2021-12-04 NOTE — Progress Notes (Signed)
PROGRESS NOTE    Erin Sutton  DVV:[id number] DOB: Dec 19, 1940 DOA: 12/02/2021 PCP: Baxter Hire, MD   Brief Narrative: 81 year old with past medical history significant for A. fib on Eliquis, hypertension, dementia, generalized anxiety disorder, insomnia, GERD who presents to the ED complaining of weakness fatigue and chest pressure.   Assessment & Plan:   Principal Problem:   TIA (transient ischemic attack) Active Problems:   Confusion   Hypothyroidism   HTN (hypertension)   GERD (gastroesophageal reflux disease)   Anxiety   Abnormal gait   Hyperlipidemia   Sleep apnea   Dementia (HCC)   Generalized anxiety disorder   Paroxysmal atrial fibrillation (HCC)   Chronic anticoagulation - on eliquis for afib   Elevated troponin   Weakness  1-Stroke was ruled out. MRI was negative for stroke Neurology consulted:  2-Elevation of troponin secondary to demand ischemia: Non-STEMI was ruled out. Elevation of troponin likely related to elevation of blood pressure. Receive heparin drip.  3-Generalized weakness: Report intolerance to metoprolol.  Cardiology would like for patient to be on metoprolol discussed with patient patient agreed to stay with metoprolol  Hypertension: Systolic blood pressure in the 200 range on admission. Had some low blood pressures yesterday amlodipine held Continue with metoprolol Hypertensive encephalopathy/  Improved,.   Hypothyroidism: Continue with Synthroid History of diastolic heart failure: Appears euvolemic Atrial fibrillation on anticoagulation: Continue with Eliquis Continue with metoprolol  Hyperlipidemia: Continue with atorvastatin Vaginal bleeding: She will need to follow-up with a gynecologist as an outpatient  Epigastric pain: We will start Protonix.  Check liver function test and lipase.    Estimated body mass index is 25.5 kg/m as calculated from the following:   Height as of this encounter: 5\' 3"  (1.6 m).   Weight as  of this encounter: 65.3 kg.   DVT prophylaxis: eliquis Code Status: Full code Family Communication: care discussed with patient Disposition Plan:  Status is: Observation  The patient remains OBS appropriate and will d/c before 2 midnights.       Consultants:  Cardiology     Antimicrobials:    Subjective: She is feeling weak. Report epigastric fullness pain.   Objective: Vitals:   12/04/21 0415 12/04/21 0741 12/04/21 1049 12/04/21 1203  BP: (!) 90/56 (!) 154/86 (!) 154/89 119/72  Pulse: 76 86 92 77  Resp: 20 16 18 16   Temp: 97.7 F (36.5 C) 98 F (36.7 C)  98.8 F (37.1 C)  TempSrc: Oral Oral  Oral  SpO2: 96% 96% 99% 96%  Weight:      Height:        Intake/Output Summary (Last 24 hours) at 12/04/2021 1522 Last data filed at 12/04/2021 1300 Gross per 24 hour  Intake 680 ml  Output --  Net 680 ml   Filed Weights   12/02/21 1510  Weight: 65.3 kg    Examination:  General exam: Appears calm and comfortable  Respiratory system: Clear to auscultation. Respiratory effort normal. Cardiovascular system: S1 & S2 heard, RRR. No JVD, murmurs, rubs, gallops or clicks. No pedal edema. Gastrointestinal system: Abdomen is nondistended, soft and nontender. No organomegaly or masses felt. Normal bowel sounds heard. Central nervous system: Alert and oriented.  Extremities: Symmetric 5 x 5 power.   Data Reviewed: I have personally reviewed following labs and imaging studies  CBC: Recent Labs  Lab 12/02/21 1551 12/04/21 0430  WBC 5.8 6.1  NEUTROABS 3.9  --   HGB 11.8* 10.9*  HCT 36.2 33.8*  MCV 89.4 88.9  PLT 262 850   Basic Metabolic Panel: Recent Labs  Lab 12/02/21 1551 12/04/21 0430  NA 132* 137  K 4.0 4.1  CL 98 103  CO2 25 25  GLUCOSE 102* 103*  BUN 11 24*  CREATININE 0.77 0.99  CALCIUM 9.7 9.4  MG  --  1.9   GFR: Estimated Creatinine Clearance: 41.2 mL/min (by C-G formula based on SCr of 0.99 mg/dL). Liver Function Tests: Recent Labs  Lab  12/02/21 1551  AST 18  ALT 10  ALKPHOS 48  BILITOT 0.6  PROT 6.9  ALBUMIN 3.8   No results for input(s): LIPASE, AMYLASE in the last 168 hours. No results for input(s): AMMONIA in the last 168 hours. Coagulation Profile: Recent Labs  Lab 12/02/21 1551  INR 1.5*   Cardiac Enzymes: No results for input(s): CKTOTAL, CKMB, CKMBINDEX, TROPONINI in the last 168 hours. BNP (last 3 results) No results for input(s): PROBNP in the last 8760 hours. HbA1C: Recent Labs    12/03/21 0232  HGBA1C 5.3   CBG: Recent Labs  Lab 12/02/21 1525  GLUCAP 105*   Lipid Profile: Recent Labs    12/03/21 0232  CHOL 133  HDL 50  LDLCALC 68  TRIG 74  CHOLHDL 2.7   Thyroid Function Tests: Recent Labs    12/02/21 1551  TSH 0.512   Anemia Panel: Recent Labs    12/02/21 1551  VITAMINB12 235   Sepsis Labs: Recent Labs  Lab 12/02/21 1513 12/02/21 1713  LATICACIDVEN 1.4 1.3    Recent Results (from the past 240 hour(s))  Wet prep, genital     Status: Abnormal   Collection Time: 11/24/21  3:53 PM   Specimen: Vaginal  Result Value Ref Range Status   Yeast Wet Prep HPF POC NONE SEEN NONE SEEN Final   Trich, Wet Prep NONE SEEN NONE SEEN Final   Clue Cells Wet Prep HPF POC NONE SEEN NONE SEEN Final   WBC, Wet Prep HPF POC <10 (A) <10 Final   Sperm NONE SEEN  Final    Comment: Performed at [facility], [address], [place], [place] [address]  Culture, blood (Routine x 2)     Status: None (Preliminary result)   Collection Time: 12/02/21  3:50 PM   Specimen: BLOOD  Result Value Ref Range Status   Specimen Description BLOOD LAC  Final   Special Requests BOTTLES DRAWN AEROBIC AND ANAEROBIC BCAV  Final   Culture   Final    NO GROWTH 2 DAYS Performed at [facility], [address], [place],  [address]    Report Status PENDING  Incomplete  Resp Panel by RT-PCR (Flu A&B, Covid) Nasopharyngeal Swab     Status: None   Collection Time: 12/02/21  3:51 PM    Specimen: Nasopharyngeal Swab; Nasopharyngeal(NP) swabs in vial transport medium  Result Value Ref Range Status   SARS Coronavirus 2 by RT PCR NEGATIVE NEGATIVE Final    Comment: (NOTE) SARS-CoV-2 target nucleic acids are NOT DETECTED.  The SARS-CoV-2 RNA is generally detectable in upper respiratory specimens during the acute phase of infection. The lowest concentration of SARS-CoV-2 viral copies this assay can detect is 138 copies/mL. A negative result does not preclude SARS-Cov-2 infection and should not be used as the sole basis for treatment or other patient management decisions. A negative result may occur with  improper specimen collection/handling, submission of specimen other than nasopharyngeal swab, presence of viral mutation(s) within the areas targeted by this assay, and  inadequate number of viral copies(<138 copies/mL). A negative result must be combined with clinical observations, patient history, and epidemiological information. The expected result is Negative.  Fact Sheet for Patients:  EntrepreneurPulse.com.au  Fact Sheet for Healthcare Providers:  IncredibleEmployment.be  This test is no t yet approved or cleared by the [place] FDA and  has been authorized for detection and/or diagnosis of SARS-CoV-2 by FDA under an Emergency Use Authorization (EUA). This EUA will remain  in effect (meaning this test can be used) for the duration of the COVID-19 declaration under Section 564(b)(1) of the Act, 21 U.S.C.section 360bbb-3(b)(1), unless the authorization is terminated  or revoked sooner.       Influenza A by PCR NEGATIVE NEGATIVE Final   Influenza B by PCR NEGATIVE NEGATIVE Final    Comment: (NOTE) The Xpert Xpress SARS-CoV-2/FLU/RSV plus assay is intended as an aid in the diagnosis of influenza from Nasopharyngeal swab specimens and should not be used as a sole basis for treatment. Nasal washings and aspirates are  unacceptable for Xpert Xpress SARS-CoV-2/FLU/RSV testing.  Fact Sheet for Patients: EntrepreneurPulse.com.au  Fact Sheet for Healthcare Providers: IncredibleEmployment.be  This test is not yet approved or cleared by the [place] FDA and has been authorized for detection and/or diagnosis of SARS-CoV-2 by FDA under an Emergency Use Authorization (EUA). This EUA will remain in effect (meaning this test can be used) for the duration of the COVID-19 declaration under Section 564(b)(1) of the Act, 21 U.S.C. section 360bbb-3(b)(1), unless the authorization is terminated or revoked.  Performed at [facility], [address] [place], [place] [address]   Urine Culture     Status: Abnormal   Collection Time: 12/02/21  3:51 PM   Specimen: Urine, Clean Catch  Result Value Ref Range Status   Specimen Description   Final    URINE, CLEAN CATCH Performed at [facility], [address], [place], [place] [address]    Special Requests   Final    NONE Performed at [facility], [address] [place], [place] [address]    Culture MULTIPLE SPECIES PRESENT, SUGGEST RECOLLECTION (A)  Final   Report Status 12/03/2021 FINAL  Final  Culture, blood (Routine x 2)     Status: None (Preliminary result)   Collection Time: 12/02/21  4:00 PM   Specimen: BLOOD  Result Value Ref Range Status   Specimen Description BLOOD [facility]  Final   Special Requests BOTTLES DRAWN AEROBIC AND ANAEROBIC BCAV  Final   Culture   Final    NO GROWTH 2 DAYS Performed at [facility], [address], [place], [place] [address]    Report Status PENDING  Incomplete         Radiology Studies: MR BRAIN WO CONTRAST  Result Date: 12/02/2021 CLINICAL DATA:  Initial evaluation for neuro deficit, stroke suspected. EXAM: MRI HEAD WITHOUT CONTRAST TECHNIQUE: Multiplanar, multiecho pulse sequences of the brain and surrounding structures were obtained  without intravenous contrast. COMPARISON:  Prior CTs from earlier the same day. FINDINGS: Brain: Cerebral volume within normal limits. Minimal chronic microvascular ischemic disease noted involving the periventricular white matter. Small remote cortical/subcortical infarct noted at the parasagittal left parieto-occipital region. Remote lacunar infarct present at the left thalamus. No abnormal foci of restricted diffusion to suggest acute or subacute ischemia. Gray-white matter differentiation maintained. No other areas of chronic cortical infarction. No evidence for acute intracranial hemorrhage. Single chronic microhemorrhage noted at the anterior left frontal lobe, of doubtful significance in isolation.  No mass lesion, midline shift or mass effect. No hydrocephalus or extra-axial fluid collection. Pituitary gland suprasellar region normal. Vascular: Major intracranial vascular flow voids are maintained. Skull and upper cervical spine: Craniocervical junction within normal limits. Bone marrow signal intensity normal. No scalp soft tissue abnormality. Sinuses/Orbits: Patient status post bilateral ocular lens replacement. Globes and orbital soft tissues demonstrate no acute finding. Paranasal sinuses are largely clear. No significant mastoid effusion. Other: None. IMPRESSION: 1. No acute intracranial abnormality. 2. 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 3. Underlying minimal for age chronic microvascular ischemic disease. Electronically Signed   By: Jeannine Boga M.D.   On: 12/02/2021 23:51   DG Chest Port 1 View  Result Date: 12/02/2021 CLINICAL DATA:  Weakness, fatigue EXAM: PORTABLE CHEST 1 VIEW COMPARISON:  11/21/2021 FINDINGS: Stable cardiomediastinal contours. Aortic atherosclerosis. No focal airspace consolidation, pleural effusion, or pneumothorax. Degenerative changes of the shoulders, left worse than  right. IMPRESSION: No active disease. Electronically Signed   By: Davina Poke D.O.   On: 12/02/2021 16:18   ECHOCARDIOGRAM COMPLETE  Result Date: 12/03/2021    ECHOCARDIOGRAM REPORT   Patient Name:   JULLIE ARPS Date of Exam: 12/03/2021 Medical Rec #:  [id number]       Height:       63.0 in Accession #:    [id number]      Weight:       144.0 lb Date of Birth:  [birth year]       BSA:          1.681 m Patient Age:    31 years        BP:           124/101 mmHg Patient Gender: F               HR:           75 bpm. Exam Location:  [facility] Procedure: 2D Echo, Cardiac Doppler and Color Doppler Indications:     Stroke I63.9  History:         Patient has prior history of Echocardiogram examinations, most                  recent 02/28/2021. Risk Factors:Hypertension. CKD, history of DVT                  and pulmonary embolism.  Sonographer:     Sherrie Sport Referring Phys:  [id number] AMY N COX Diagnosing Phys: Donnelly Angelica  Sonographer Comments: Suboptimal apical window. IMPRESSIONS  1. Left ventricular ejection fraction, by estimation, is 60 to 65%. The left ventricle has normal function. The left ventricle has no regional wall motion abnormalities. Left ventricular diastolic parameters were normal.  2. Right ventricular systolic function is normal. The right ventricular size is normal.  3. The mitral valve is normal in structure. No evidence of mitral valve regurgitation. No evidence of mitral stenosis.  4. The aortic valve is normal in structure. Aortic valve regurgitation is not visualized. No aortic stenosis is present.  5. The inferior vena cava is normal in size with greater than 50% respiratory variability, suggesting right atrial pressure of 3 mmHg. FINDINGS  Left Ventricle: Left ventricular ejection fraction, by estimation, is 60 to 65%. The left ventricle has normal function. The left ventricle has no regional wall motion abnormalities. The left ventricular internal cavity size was normal in size. There is  no left  ventricular hypertrophy. Left ventricular diastolic  parameters were normal. Right Ventricle: The right ventricular size is normal. No increase in right ventricular wall thickness. Right ventricular systolic function is normal. Left Atrium: Left atrial size was normal in size. Right Atrium: Right atrial size was normal in size. Pericardium: There is no evidence of pericardial effusion. Mitral Valve: The mitral valve is normal in structure. No evidence of mitral valve regurgitation. No evidence of mitral valve stenosis. MV peak gradient, 6.2 mmHg. The mean mitral valve gradient is 2.0 mmHg. Tricuspid Valve: The tricuspid valve is normal in structure. Tricuspid valve regurgitation is not demonstrated. No evidence of tricuspid stenosis. Aortic Valve: The aortic valve is normal in structure. Aortic valve regurgitation is not visualized. No aortic stenosis is present. Aortic valve mean gradient measures 6.0 mmHg. Aortic valve peak gradient measures 9.1 mmHg. Aortic valve area, by VTI measures 3.13 cm. Pulmonic Valve: The pulmonic valve was normal in structure. Pulmonic valve regurgitation is not visualized. No evidence of pulmonic stenosis. Aorta: The aortic root is normal in size and structure. Venous: The inferior vena cava is normal in size with greater than 50% respiratory variability, suggesting right atrial pressure of 3 mmHg. IAS/Shunts: No atrial level shunt detected by color flow Doppler.  LEFT VENTRICLE PLAX 2D LVIDd:         3.60 cm   Diastology LVIDs:         2.20 cm   LV e' medial:    5.44 cm/s LV PW:         1.10 cm   LV E/e' medial:  8.9 LV IVS:        0.80 cm   LV e' lateral:   6.42 cm/s LVOT diam:     2.00 cm   LV E/e' lateral: 7.5 LV SV:         84 LV SV Index:   50 LVOT Area:     3.14 cm  RIGHT VENTRICLE RV Basal diam:  2.30 cm RV S prime:     8.92 cm/s TAPSE (M-mode): 2.2 cm LEFT ATRIUM             Index        RIGHT ATRIUM           Index LA diam:        3.90 cm 2.32 cm/m   RA Area:     11.70 cm  LA Vol (A2C):   38.5 ml 22.90 ml/m  RA Volume:   26.10 ml  15.52 ml/m LA Vol (A4C):   42.1 ml 25.04 ml/m LA Biplane Vol: 41.4 ml 24.62 ml/m  AORTIC VALVE                     PULMONIC VALVE AV Area (Vmax):    2.52 cm      PV Vmax:        1.23 m/s AV Area (Vmean):   2.68 cm      PV Vmean:       77.600 cm/s AV Area (VTI):     3.13 cm      PV VTI:         0.215 m AV Vmax:           151.00 cm/s   PV Peak grad:   6.1 mmHg AV Vmean:          113.000 cm/s  PV Mean grad:   3.0 mmHg AV VTI:            0.268 m  RVOT Peak grad: 7 mmHg AV Peak Grad:      9.1 mmHg AV Mean Grad:      6.0 mmHg LVOT Vmax:         121.00 cm/s LVOT Vmean:        96.500 cm/s LVOT VTI:          0.267 m LVOT/AV VTI ratio: 1.00  AORTA Ao Root diam: 2.90 cm MITRAL VALVE               TRICUSPID VALVE MV Area (PHT): 2.32 cm    TR Peak grad:   15.2 mmHg MV Area VTI:   4.07 cm    TR Vmax:        195.00 cm/s MV Peak grad:  6.2 mmHg MV Mean grad:  2.0 mmHg    SHUNTS MV Vmax:       1.25 m/s    Systemic VTI:  0.27 m MV Vmean:      65.0 cm/s   Systemic Diam: 2.00 cm MV Decel Time: 327 msec    Pulmonic VTI:  0.222 m MV E velocity: 48.40 cm/s MV A velocity: 83.60 cm/s MV E/A ratio:  0.58 Donnelly Angelica Electronically signed by Donnelly Angelica Signature Date/Time: 12/03/2021/1:25:48 PM    Final    CT HEAD CODE STROKE WO CONTRAST  Result Date: 12/02/2021 CLINICAL DATA:  Code stroke.  Weakness and fatigue EXAM: CT HEAD WITHOUT CONTRAST TECHNIQUE: Contiguous axial images were obtained from the base of the skull through the vertex without intravenous contrast. COMPARISON:  11/21/2021. FINDINGS: Brain: No evidence of acute infarction, hemorrhage, cerebral edema, mass, mass effect, or midline shift. Ventricles and sulci are normal for age. No extra-axial fluid collection. Periventricular white matter changes, likely the sequela of chronic small vessel ischemic disease. Basal ganglia calcifications. Vascular: No hyperdense vessel or unexpected calcification. Skull:  Normal. Negative for fracture or focal lesion. Sinuses/Orbits: No acute finding. Other: The mastoid air cells are well aerated. ASPECTS [facility] Stroke Program Early CT Score) - Ganglionic level infarction (caudate, lentiform nuclei, internal capsule, insula, M1-M3 cortex): 7 - Supraganglionic infarction (M4-M6 cortex): 3 Total score (0-10 with 10 being normal): 10 IMPRESSION: 1. No acute intracranial process. 2. ASPECTS is 10. Code stroke imaging results were communicated on 12/02/2021 at 3:40 pm to provider Dr. Theda Sers via secure text paging. Electronically Signed   By: Merilyn Baba M.D.   On: 12/02/2021 15:41   CT ANGIO HEAD NECK W WO CM (CODE STROKE)  Result Date: 12/02/2021 CLINICAL DATA:  Weakness and fatigue EXAM: CT ANGIOGRAPHY HEAD AND NECK TECHNIQUE: Multidetector CT imaging of the head and neck was performed using the standard protocol during bolus administration of intravenous contrast. Multiplanar CT image reconstructions and MIPs were obtained to evaluate the vascular anatomy. Carotid stenosis measurements (when applicable) are obtained utilizing NASCET criteria, using the distal internal carotid diameter as the denominator. CONTRAST:  34mL OMNIPAQUE IOHEXOL 350 MG/ML SOLN COMPARISON:  08/12/2021 CTA head neck, correlation is made with same day CT head. FINDINGS: CT HEAD FINDINGS For noncontrast findings, please see same day CT head. CTA NECK FINDINGS Aortic arch: Standard branching. Imaged portion shows no evidence of aneurysm or dissection. No significant stenosis of the major arch vessel origins. Aortic atherosclerosis, which extends into the branch vessels. Right carotid system: Calcified plaque at the bifurcation and in the proximal right ICA, which causes less than 50% stenosis. No dissection or occlusion. Left carotid system: No dissection or occlusion. Minimal calcified plaque at bifurcation, without significant  stenosis. Vertebral arteries: Codominant. No evidence of dissection, stenosis  (50% or greater) or occlusion. Skeleton: Degenerative changes in the cervical spine. No acute osseous abnormality. Other neck: Prior left thyroidectomy.  Otherwise negative. Upper chest: Focal pulmonary opacity or pleural effusion. Review of the MIP images confirms the above findings CTA HEAD FINDINGS Anterior circulation: Both internal carotid arteries are patent to the termini, with moderate calcifications but without significant stenosis. A1 segments patent. Normal anterior communicating artery. Anterior cerebral arteries are patent to their distal aspects. No M1 stenosis or occlusion. Normal MCA bifurcations. Moderate stenosis again noted in a left M2 branch. Distal MCA branches perfused, although there is mildly more robust perfusion on the right compared to the left, without additional focal stenosis. Posterior circulation: Vertebral arteries patent to the vertebrobasilar junction without stenosis. Basilar patent to its distal aspect. Superior cerebellar arteries patent bilaterally. Bilateral P1 segments originate from the basilar artery, although the right posterior communicating artery is present. PCAs perfused to their distal aspects without stenosis. The left posterior communicating artery is not visualized. Venous sinuses: As permitted by contrast timing, patent. Anatomic variants: None significant Review of the MIP images confirms the above findings IMPRESSION: 1.  No intracranial large vessel occlusion or significant stenosis. 2.  No hemodynamically significant stenosis in the neck. 3. 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 Code stroke imaging results were communicated on 12/02/2021 at 4:05 pm to provider Dr. Theda Sers via secure text paging. Electronically Signed   By: Merilyn Baba M.D.   On: 12/02/2021 16:05        Scheduled Meds:   stroke: mapping our early stages of recovery book   Does not apply Once   acetaminophen  1,000 mg Oral Once   apixaban  5 mg Oral BID    aspirin  81 mg Oral Daily   atorvastatin  40 mg Oral Daily   levothyroxine  50 mcg Oral QAC breakfast   metoprolol tartrate  25 mg Oral BID   pantoprazole  40 mg Oral Daily   Continuous Infusions:   LOS: 0 days    Time spent: 35 Minutes.     Elmarie Shiley, MD [facility]   If 7PM-7AM, please contact night-coverage [email or web address]  12/04/2021, 3:22 PM

## 2021-12-05 ENCOUNTER — Ambulatory Visit: Payer: Medicare HMO | Admitting: Internal Medicine

## 2021-12-05 DIAGNOSIS — A419 Sepsis, unspecified organism: Secondary | ICD-10-CM | POA: Diagnosis not present

## 2021-12-05 DIAGNOSIS — R2681 Unsteadiness on feet: Secondary | ICD-10-CM | POA: Diagnosis not present

## 2021-12-05 DIAGNOSIS — G459 Transient cerebral ischemic attack, unspecified: Secondary | ICD-10-CM | POA: Diagnosis not present

## 2021-12-05 DIAGNOSIS — I10 Essential (primary) hypertension: Secondary | ICD-10-CM | POA: Diagnosis not present

## 2021-12-05 DIAGNOSIS — I639 Cerebral infarction, unspecified: Secondary | ICD-10-CM | POA: Diagnosis not present

## 2021-12-05 DIAGNOSIS — Z743 Need for continuous supervision: Secondary | ICD-10-CM | POA: Diagnosis not present

## 2021-12-05 DIAGNOSIS — I482 Chronic atrial fibrillation, unspecified: Secondary | ICD-10-CM | POA: Diagnosis not present

## 2021-12-05 DIAGNOSIS — R531 Weakness: Secondary | ICD-10-CM | POA: Diagnosis not present

## 2021-12-05 DIAGNOSIS — M6281 Muscle weakness (generalized): Secondary | ICD-10-CM | POA: Diagnosis not present

## 2021-12-05 DIAGNOSIS — N939 Abnormal uterine and vaginal bleeding, unspecified: Secondary | ICD-10-CM | POA: Diagnosis not present

## 2021-12-05 DIAGNOSIS — I130 Hypertensive heart and chronic kidney disease with heart failure and stage 1 through stage 4 chronic kidney disease, or unspecified chronic kidney disease: Secondary | ICD-10-CM | POA: Diagnosis not present

## 2021-12-05 DIAGNOSIS — N390 Urinary tract infection, site not specified: Secondary | ICD-10-CM | POA: Diagnosis not present

## 2021-12-05 DIAGNOSIS — N189 Chronic kidney disease, unspecified: Secondary | ICD-10-CM | POA: Diagnosis not present

## 2021-12-05 DIAGNOSIS — R5381 Other malaise: Secondary | ICD-10-CM | POA: Diagnosis not present

## 2021-12-05 DIAGNOSIS — G9340 Encephalopathy, unspecified: Secondary | ICD-10-CM | POA: Diagnosis not present

## 2021-12-05 DIAGNOSIS — Z20822 Contact with and (suspected) exposure to covid-19: Secondary | ICD-10-CM | POA: Diagnosis not present

## 2021-12-05 DIAGNOSIS — F0390 Unspecified dementia without behavioral disturbance: Secondary | ICD-10-CM | POA: Diagnosis not present

## 2021-12-05 DIAGNOSIS — I5032 Chronic diastolic (congestive) heart failure: Secondary | ICD-10-CM | POA: Diagnosis not present

## 2021-12-05 LAB — CBC
HCT: 32.2 % — ABNORMAL LOW (ref 36.0–46.0)
Hemoglobin: 10.2 g/dL — ABNORMAL LOW (ref 12.0–15.0)
MCH: 29.1 pg (ref 26.0–34.0)
MCHC: 31.7 g/dL (ref 30.0–36.0)
MCV: 91.7 fL (ref 80.0–100.0)
Platelets: 234 10*3/uL (ref 150–400)
RBC: 3.51 MIL/uL — ABNORMAL LOW (ref 3.87–5.11)
RDW: 16.5 % — ABNORMAL HIGH (ref 11.5–15.5)
WBC: 6.7 10*3/uL (ref 4.0–10.5)
nRBC: 0 % (ref 0.0–0.2)

## 2021-12-05 LAB — BASIC METABOLIC PANEL
Anion gap: 7 (ref 5–15)
BUN: 24 mg/dL — ABNORMAL HIGH (ref 8–23)
CO2: 26 mmol/L (ref 22–32)
Calcium: 8.9 mg/dL (ref 8.9–10.3)
Chloride: 106 mmol/L (ref 98–111)
Creatinine, Ser: 1.01 mg/dL — ABNORMAL HIGH (ref 0.44–1.00)
GFR, Estimated: 56 mL/min — ABNORMAL LOW (ref >60)
Glucose, Bld: 97 mg/dL (ref 70–99)
Potassium: 4.2 mmol/L (ref 3.5–5.1)
Sodium: 139 mmol/L (ref 135–145)

## 2021-12-05 LAB — MAGNESIUM: Magnesium: 1.8 mg/dL (ref 1.7–2.4)

## 2021-12-05 MED ORDER — PANTOPRAZOLE SODIUM 40 MG PO TBEC: 40.0000 mg | DELAYED_RELEASE_TABLET | Freq: Two times a day (BID) | ORAL | 0 refills | Status: DC

## 2021-12-05 MED ORDER — MAGNESIUM SULFATE 2 GM/50ML IV SOLN
2.0000 g | Freq: Once | INTRAVENOUS | Status: AC
  Administered 2021-12-05: 2 g via INTRAVENOUS
  Filled 2021-12-05: qty 50

## 2021-12-05 MED ORDER — ASPIRIN 81 MG PO CHEW: 81.0000 mg | CHEWABLE_TABLET | Freq: Every day | ORAL | 1 refills | Status: DC

## 2021-12-05 MED ORDER — HYDROCODONE-ACETAMINOPHEN 5-325 MG PO TABS
1.0000 | ORAL_TABLET | Freq: Once | ORAL | Status: AC
  Administered 2021-12-05: 1 via ORAL
  Filled 2021-12-05: qty 1

## 2021-12-05 MED ORDER — HYDROCODONE-ACETAMINOPHEN 5-325 MG PO TABS
1.0000 | ORAL_TABLET | Freq: Four times a day (QID) | ORAL | Status: DC | PRN
  Administered 2021-12-06: 1 via ORAL
  Filled 2021-12-05 (×2): qty 1

## 2021-12-05 NOTE — Progress Notes (Signed)
Physical Therapy Treatment Patient Details Name: Erin Sutton MRN: [id number] DOB: 12-03-1940 Today's Date: 12/05/2021   History of Present Illness Erin Sutton is an 81 y.o. female with medical history significant for atrial fibrillation on anticoagulation, on Eliquis, hypertension, hyperlipidemia, dementia, generalized anxiety disorder, insomnia, GERD, who presents emergency department for chief concern of weakness and fatigue.    PT Comments    Patient making progress towards meeting functional goals. Patient has increased activity tolerance this session and ambulated 55ft with rolling walker. Patient continues to need assistance for all activity for safety. Further activity limited due to fatigue. She is not at her baseline level of functional mobility. Recommend PT follow up to maximize independence and facilitate return to prior level of function. SNF is recommended at discharge.    Recommendations for follow up therapy are one component of a multi-disciplinary discharge planning process, led by the attending physician.  Recommendations may be updated based on patient status, additional functional criteria and insurance authorization.  Follow Up Recommendations  Skilled nursing-short term rehab (<3 hours/day)     Assistance Recommended at Discharge Frequent or constant Supervision/Assistance  Patient can return home with the following Assistance with cooking/housework;A lot of help with walking and/or transfers;A lot of help with bathing/dressing/bathroom;Assist for transportation;Help with stairs or ramp for entrance   Equipment Recommendations  None recommended by PT    Recommendations for Other Services       Precautions / Restrictions Precautions Precautions: Fall Restrictions Weight Bearing Restrictions: No     Mobility  Bed Mobility Overal bed mobility: Needs Assistance Bed Mobility: Sit to Supine     Supine to sit: Min guard;HOB elevated Sit to supine: Min  guard;HOB elevated   General bed mobility comments: increased time and effort requried    Transfers Overall transfer level: Needs assistance Equipment used: None Transfers: Sit to/from Stand;Bed to chair/wheelchair/BSC Sit to Stand: Min assist     Step pivot transfers: Min assist     General transfer comment: steadying assistance provided. cues for safety    Ambulation/Gait Ambulation/Gait assistance: Min guard Gait Distance (Feet): 30 Feet Assistive device: Rolling walker (2 wheels) Gait Pattern/deviations: Step-through pattern;Narrow base of support Gait velocity: decreased     General Gait Details: patient required Min guard for safety. verbal cues for technique using rolling walker. occasional cues for safety. mild shortness of breath and fatigue after ambulation. Sp02 98% on room air after walk   Stairs             Wheelchair Mobility    Modified Rankin (Stroke Patients Only)       Balance Overall balance assessment: Needs assistance Sitting-balance support: No upper extremity supported;Feet supported Sitting balance-Leahy Scale: Fair     Standing balance support: Bilateral upper extremity supported;Reliant on assistive device for balance Standing balance-Leahy Scale: Fair Standing balance comment: patient relying on rolling walker for support in standing                            Cognition Arousal/Alertness: Awake/alert Behavior During Therapy: WFL for tasks assessed/performed Overall Cognitive Status: Within Functional Limits for tasks assessed                                 General Comments: patient able to follow all commands without difficulty        Exercises      General Comments  General comments (skin integrity, edema, etc.): patient had bowel movement on bed side commode and was able to complete peri-care in sitting position with set-up.      Pertinent Vitals/Pain Pain Assessment: No/denies pain     Home Living                          Prior Function            PT Goals (current goals can now be found in the care plan section) Acute Rehab PT Goals Patient Stated Goal: to get to rehab PT Goal Formulation: With patient Time For Goal Achievement: 12/17/21 Potential to Achieve Goals: Fair Progress towards PT goals: Progressing toward goals    Frequency    Min 2X/week      PT Plan Current plan remains appropriate    Co-evaluation              AM-PAC PT "6 Clicks" Mobility   Outcome Measure  Help needed turning from your back to your side while in a flat bed without using bedrails?: A Little Help needed moving from lying on your back to sitting on the side of a flat bed without using bedrails?: A Lot Help needed moving to and from a bed to a chair (including a wheelchair)?: A Little Help needed standing up from a chair using your arms (e.g., wheelchair or bedside chair)?: A Little Help needed to walk in hospital room?: A Little Help needed climbing 3-5 steps with a railing? : Total 6 Click Score: 15    End of Session         PT Visit Diagnosis: Unsteadiness on feet (R26.81);Muscle weakness (generalized) (M62.81);Difficulty in walking, not elsewhere classified (R26.2)     Time: 1416-1440 PT Time Calculation (min) (ACUTE ONLY): 24 min  Charges:  $Gait Training: 8-22 mins $Therapeutic Activity: 8-22 mins                    Minna Merritts, PT, MPT    Percell Locus 12/05/2021, 2:46 PM

## 2021-12-05 NOTE — Progress Notes (Signed)
Patient crying, writhing in bed, with uncontrolled pain 8/10. Offered heat/ice/muscle rub, pt states none of those things help. Notified Neomia Glass NP, see new orders.

## 2021-12-05 NOTE — Discharge Summary (Signed)
Physician Discharge Summary  Erin Sutton KKX:[id number] DOB: December 02, 1940 DOA: 12/02/2021  PCP: Baxter Hire, MD  Admit date: 12/02/2021 Discharge date: 12/05/2021  Admitted From: Home  Disposition:  SNF  Recommendations for Outpatient Follow-up:  Follow up with PCP in 1-2 weeks Please obtain BMP/CBC in one week Adjust BP medications as needed.  Needs follow up with GI for epigastric pain  Follow GYN for vaginal bleed.     Discharge Condition: Stable.  CODE STATUS: Full Code Diet recommendation: Heart Healthy  Brief/Interim Summary: 81 year old with past medical history significant for A. fib on Eliquis, hypertension, dementia, generalized anxiety disorder, insomnia, GERD who presents to the ED complaining of weakness fatigue and chest pressure    1-Stroke was ruled out. MRI was negative for stroke Neurology consulted:   2-Elevation of troponin secondary to demand ischemia: Non-STEMI was ruled out. Elevation of troponin likely related to elevation of blood pressure. Receive heparin drip.   3-Generalized weakness: Report intolerance to metoprolol.  Cardiology would like for patient to be on metoprolol discussed with patient patient agreed to stay with metoprolol   Hypertension: Systolic blood pressure in the 200 range on admission. Had some low blood pressures yesterday amlodipine held Continue with metoprolol. Amlodipine PRN for elevated BP  Hypertensive encephalopathy/  Improved,.    Hypothyroidism: Continue with Synthroid History of diastolic heart failure: Appears euvolemic Atrial fibrillation on anticoagulation: Continue with Eliquis Continue with metoprolol   Hyperlipidemia: Continue with atorvastatin Vaginal bleeding: She will need to follow-up with a gynecologist as an outpatient   Epigastric pain: started  Protonix. Liver function test and lipase normal. Pain improved. Decline CT scan.          Discharge Diagnoses:  Principal Problem:   TIA  (transient ischemic attack) Active Problems:   Confusion   Hypothyroidism   HTN (hypertension)   GERD (gastroesophageal reflux disease)   Anxiety   Abnormal gait   Hyperlipidemia   Sleep apnea   Dementia (HCC)   Generalized anxiety disorder   Paroxysmal atrial fibrillation (HCC)   Chronic anticoagulation - on eliquis for afib   Elevated troponin   Weakness    Discharge Instructions  Discharge Instructions     Diet - low sodium heart healthy   Complete by: As directed    Increase activity slowly   Complete by: As directed       Allergies as of 12/05/2021       Reactions   Ace Inhibitors Cough   Levofloxacin    Penicillin G Other (See Comments)   Tape Other (See Comments)   Other reaction(s): Other (See Comments)   Amoxicillin Rash   Has patient had a PCN reaction causing immediate rash, facial/tongue/throat swelling, SOB or lightheadedness with hypotension: No Has patient had a PCN reaction causing severe rash involving mucus membranes or skin necrosis: No Has patient had a PCN reaction that required hospitalization: No Has patient had a PCN reaction occurring within the last 10 years: No If all of the above answers are "NO", then may proceed with Cephalosporin use.   Benicar [olmesartan] Rash   Itching rash   Codeine Rash   Codeine Sulfate Rash   Levaquin [levofloxacin In D5w] Rash   Alters mental status   Penicillin V Potassium Rash   Has patient had a PCN reaction causing immediate rash, facial/tongue/throat swelling, SOB or lightheadedness with hypotension: No Has patient had a PCN reaction causing severe rash involving mucus membranes or skin necrosis: No Has patient had a PCN  reaction that required hospitalization: No Has patient had a PCN reaction occurring within the last 10 years: No If all of the above answers are "NO", then may proceed with Cephalosporin use.   Sulfa Antibiotics Rash        Medication List     STOP taking these medications     acetaminophen 500 MG tablet Commonly known as: TYLENOL   cefUROXime 250 MG tablet Commonly known as: CEFTIN   cyanocobalamin 1000 MCG/ML injection Commonly known as: (VITAMIN B-12)   LORazepam 0.5 MG tablet Commonly known as: ATIVAN   neomycin-polymyxin b-dexamethasone 3.5-10000-0.1 Oint Commonly known as: MAXITROL   nitrofurantoin (macrocrystal-monohydrate) 100 MG capsule Commonly known as: MACROBID   ondansetron 8 MG disintegrating tablet Commonly known as: ZOFRAN-ODT   oxyCODONE 5 MG immediate release tablet Commonly known as: Oxy IR/ROXICODONE   phenazopyridine 200 MG tablet Commonly known as: PYRIDIUM   spironolactone 25 MG tablet Commonly known as: ALDACTONE   Zinc Oxide 40 % Pste       TAKE these medications    amLODipine 2.5 MG tablet Commonly known as: NORVASC Take 1 tablet (2.5 mg total) by mouth daily as needed (for SBP>180).   apixaban 5 MG Tabs tablet Commonly known as: ELIQUIS Take 1 tablet (5 mg total) by mouth 2 (two) times daily.   aspirin 81 MG chewable tablet Chew 1 tablet (81 mg total) by mouth daily. Start taking on: December 06, 2021   atorvastatin 40 MG tablet Commonly known as: LIPITOR Take 40 mg by mouth daily.   diclofenac Sodium 1 % Gel Commonly known as: VOLTAREN Apply 1 application topically daily.   HYDROcodone-acetaminophen 5-325 MG tablet Commonly known as: NORCO/VICODIN Take 1 tablet by mouth every 4 (four) hours.   levothyroxine 50 MCG tablet Commonly known as: SYNTHROID Take 50 mcg by mouth daily before breakfast.   melatonin 3 MG Tabs tablet Take 1 tablet by mouth at bedtime as needed.   metoprolol tartrate 25 MG tablet Commonly known as: LOPRESSOR Take 1 tablet (25 mg total) by mouth in the morning and at bedtime.   pantoprazole 40 MG tablet Commonly known as: PROTONIX Take 1 tablet (40 mg total) by mouth 2 (two) times daily. What changed: when to take this   polyethylene glycol powder 17 GM/SCOOP  powder Commonly known as: GLYCOLAX/MIRALAX Take 17 g by mouth daily.   senna-docusate 8.6-50 MG tablet Commonly known as: Senokot-S Take 2 tablets by mouth 2 (two) times daily as needed for mild constipation.   SYSTANE OP Place 1 drop into both eyes 2 (two) times daily.   thiamine 100 MG tablet Take 1 tablet by mouth daily.        Contact information for after-discharge care     Destination     [facility] .   Service: Skilled Nursing Contact information: [place] [address] [phone]                    Allergies  Allergen Reactions   Ace Inhibitors Cough   Levofloxacin    Penicillin G Other (See Comments)   Tape Other (See Comments)    Other reaction(s): Other (See Comments)   Amoxicillin Rash    Has patient had a PCN reaction causing immediate rash, facial/tongue/throat swelling, SOB or lightheadedness with hypotension: No Has patient had a PCN reaction causing severe rash involving mucus membranes or skin necrosis: No Has patient had a PCN reaction that required hospitalization: No  Has patient had a PCN reaction occurring within the last 10 years: No If all of the above answers are "NO", then may proceed with Cephalosporin use.    Benicar [Olmesartan] Rash    Itching rash   Codeine Rash   Codeine Sulfate Rash   Levaquin [Levofloxacin In D5w] Rash    Alters mental status   Penicillin V Potassium Rash    Has patient had a PCN reaction causing immediate rash, facial/tongue/throat swelling, SOB or lightheadedness with hypotension: No Has patient had a PCN reaction causing severe rash involving mucus membranes or skin necrosis: No Has patient had a PCN reaction that required hospitalization: No Has patient had a PCN reaction occurring within the last 10 years: No If all of the above answers are "NO", then may proceed with Cephalosporin use.    Sulfa Antibiotics Rash     Consultations: Cardiology    Procedures/Studies: CT HEAD WO CONTRAST (5MM)  Result Date: 11/21/2021 CLINICAL DATA:  Mental status change, persistent or worsening. Seen here yesterday for hypertension. Severe headache. EXAM: CT HEAD WITHOUT CONTRAST TECHNIQUE: Contiguous axial images were obtained from the base of the skull through the vertex without intravenous contrast. COMPARISON:  11/20/2021 FINDINGS: Brain: No evidence of acute infarction, hemorrhage, hydrocephalus, extra-axial collection or mass lesion/mass effect. Mild cerebral atrophy. Low-attenuation changes in the deep white matter most likely representing small vessel ischemia. Basal ganglia calcifications. No change since prior study. Vascular: Moderate intracranial arterial vascular calcifications. Skull: Calvarium appears intact. Sinuses/Orbits: Paranasal sinuses and mastoid air cells are clear. Other: No significant change since prior study. IMPRESSION: No acute intracranial abnormalities. Mild chronic atrophy and small vessel ischemic changes. Electronically Signed   By: Lucienne Capers M.D.   On: 11/21/2021 15:36   CT HEAD WO CONTRAST (5MM)  Result Date: 11/20/2021 CLINICAL DATA:  Altered mental status. EXAM: CT HEAD WITHOUT CONTRAST TECHNIQUE: Contiguous axial images were obtained from the base of the skull through the vertex without intravenous contrast. COMPARISON:  Head CT dated 11/11/2021. FINDINGS: Brain: The ventricles and sulci appropriate size for patient's age. Mild periventricular and deep white matter chronic microvascular ischemic changes noted. There is no acute intracranial hemorrhage. No mass effect or midline shift. No extra-axial fluid collection. Vascular: No hyperdense vessel or unexpected calcification. Skull: Normal. Negative for fracture or focal lesion. Sinuses/Orbits: No acute finding. Other: None IMPRESSION: 1. No acute intracranial pathology. 2. Mild chronic microvascular ischemic changes. Electronically  Signed   By: Anner Crete M.D.   On: 11/20/2021 21:19   CT Head Wo Contrast  Result Date: 11/11/2021 CLINICAL DATA:  Weakness.  Mental status changes. EXAM: CT HEAD WITHOUT CONTRAST TECHNIQUE: Contiguous axial images were obtained from the base of the skull through the vertex without intravenous contrast. COMPARISON:  08/17/2021 FINDINGS: Brain: No mass lesion, hemorrhage, hydrocephalus, acute infarct, intra-axial, or extra-axial fluid collection. Vascular: No hyperdense vessel or unexpected calcification. Intracranial atherosclerosis. Skull: No significant soft tissue swelling.  No skull fracture. Sinuses/Orbits: Normal imaged portions of the orbits and globes. Clear paranasal sinuses and mastoid air cells. Other: None. IMPRESSION: No acute intracranial abnormality. Electronically Signed   By: Abigail Miyamoto M.D.   On: 11/11/2021 12:55   MR BRAIN WO CONTRAST  Result Date: 12/02/2021 CLINICAL DATA:  Initial evaluation for neuro deficit, stroke suspected. EXAM: MRI HEAD WITHOUT CONTRAST TECHNIQUE: Multiplanar, multiecho pulse sequences of the brain and surrounding structures were obtained without intravenous contrast. COMPARISON:  Prior CTs from earlier the same day. FINDINGS: Brain: Cerebral volume within normal  limits. Minimal chronic microvascular ischemic disease noted involving the periventricular white matter. Small remote cortical/subcortical infarct noted at the parasagittal left parieto-occipital region. Remote lacunar infarct present at the left thalamus. No abnormal foci of restricted diffusion to suggest acute or subacute ischemia. Gray-white matter differentiation maintained. No other areas of chronic cortical infarction. No evidence for acute intracranial hemorrhage. Single chronic microhemorrhage noted at the anterior left frontal lobe, of doubtful significance in isolation. No mass lesion, midline shift or mass effect. No hydrocephalus or extra-axial fluid collection. Pituitary gland  suprasellar region normal. Vascular: Major intracranial vascular flow voids are maintained. Skull and upper cervical spine: Craniocervical junction within normal limits. Bone marrow signal intensity normal. No scalp soft tissue abnormality. Sinuses/Orbits: Patient status post bilateral ocular lens replacement. Globes and orbital soft tissues demonstrate no acute finding. Paranasal sinuses are largely clear. No significant mastoid effusion. Other: None. IMPRESSION: 1. No acute intracranial abnormality. 2. 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 3. Underlying minimal for age chronic microvascular ischemic disease. Electronically Signed   By: Jeannine Boga M.D.   On: 12/02/2021 23:51   DG Chest Port 1 View  Result Date: 12/02/2021 CLINICAL DATA:  Weakness, fatigue EXAM: PORTABLE CHEST 1 VIEW COMPARISON:  11/21/2021 FINDINGS: Stable cardiomediastinal contours. Aortic atherosclerosis. No focal airspace consolidation, pleural effusion, or pneumothorax. Degenerative changes of the shoulders, left worse than right. IMPRESSION: No active disease. Electronically Signed   By: Davina Poke D.O.   On: 12/02/2021 16:18   DG Chest Port 1 View  Result Date: 11/21/2021 CLINICAL DATA:  81 year old female with a history chest pain EXAM: PORTABLE CHEST 1 VIEW COMPARISON:  11/20/2021 FINDINGS: Cardiomediastinal silhouette unchanged in size and contour. Calcifications of the aortic arch. No evidence of central vascular congestion. No interlobular septal thickening. Lung volumes remain low. Coarsened interstitial markings with no new confluent airspace disease. Similar appearance of asymmetric elevation of the right hemidiaphragm. No pneumothorax or pleural effusion. No acute displaced fracture. Degenerative changes of the spine. IMPRESSION: Negative for acute cardiopulmonary disease. Electronically Signed   By: Corrie Mckusick  D.O.   On: 11/21/2021 15:53   DG Chest Portable 1 View  Result Date: 11/20/2021 CLINICAL DATA:  Hypertension EXAM: PORTABLE CHEST 1 VIEW COMPARISON:  11/11/2021 FINDINGS: Shallow inspiration with low lung volumes. Mild bibasilar atelectasis/scarring. No pleural effusion. No pneumothorax. IMPRESSION: Mild bibasilar atelectasis/scarring. Electronically Signed   By: Macy Mis M.D.   On: 11/20/2021 16:17   DG Chest Portable 1 View  Result Date: 11/11/2021 CLINICAL DATA:  Weakness, altered mental status, recent knee surgery EXAM: PORTABLE CHEST 1 VIEW COMPARISON:  Portable exam 1201 hours compared to 08/29/2021 FINDINGS: Normal heart size, mediastinal contours, and pulmonary vascularity. Atherosclerotic calcification aorta. Minimal atelectasis LEFT base. No definite infiltrate, pleural effusion, or pneumothorax. Bones demineralized IMPRESSION: Minimal LEFT basilar atelectasis. Aortic Atherosclerosis (ICD10-I70.0). Electronically Signed   By: Lavonia Dana M.D.   On: 11/11/2021 12:13   ECHOCARDIOGRAM COMPLETE  Result Date: 12/03/2021    ECHOCARDIOGRAM REPORT   Patient Name:   Erin Sutton Date of Exam: 12/03/2021 Medical Rec #:  [id number]       Height:       63.0 in Accession #:    [id number]      Weight:       144.0 lb Date of Birth:  1941/07/16       BSA:          1.681 m Patient Age:  80 years        BP:           124/101 mmHg Patient Gender: F               HR:           75 bpm. Exam Location:  [facility] Procedure: 2D Echo, Cardiac Doppler and Color Doppler Indications:     Stroke I63.9  History:         Patient has prior history of Echocardiogram examinations, most                  recent 02/28/2021. Risk Factors:Hypertension. CKD, history of DVT                  and pulmonary embolism.  Sonographer:     Sherrie Sport Referring Phys:  [id number] AMY N COX Diagnosing Phys: Donnelly Angelica  Sonographer Comments: Suboptimal apical window. IMPRESSIONS  1. Left ventricular ejection fraction, by estimation, is 60 to  65%. The left ventricle has normal function. The left ventricle has no regional wall motion abnormalities. Left ventricular diastolic parameters were normal.  2. Right ventricular systolic function is normal. The right ventricular size is normal.  3. The mitral valve is normal in structure. No evidence of mitral valve regurgitation. No evidence of mitral stenosis.  4. The aortic valve is normal in structure. Aortic valve regurgitation is not visualized. No aortic stenosis is present.  5. The inferior vena cava is normal in size with greater than 50% respiratory variability, suggesting right atrial pressure of 3 mmHg. FINDINGS  Left Ventricle: Left ventricular ejection fraction, by estimation, is 60 to 65%. The left ventricle has normal function. The left ventricle has no regional wall motion abnormalities. The left ventricular internal cavity size was normal in size. There is  no left ventricular hypertrophy. Left ventricular diastolic parameters were normal. Right Ventricle: The right ventricular size is normal. No increase in right ventricular wall thickness. Right ventricular systolic function is normal. Left Atrium: Left atrial size was normal in size. Right Atrium: Right atrial size was normal in size. Pericardium: There is no evidence of pericardial effusion. Mitral Valve: The mitral valve is normal in structure. No evidence of mitral valve regurgitation. No evidence of mitral valve stenosis. MV peak gradient, 6.2 mmHg. The mean mitral valve gradient is 2.0 mmHg. Tricuspid Valve: The tricuspid valve is normal in structure. Tricuspid valve regurgitation is not demonstrated. No evidence of tricuspid stenosis. Aortic Valve: The aortic valve is normal in structure. Aortic valve regurgitation is not visualized. No aortic stenosis is present. Aortic valve mean gradient measures 6.0 mmHg. Aortic valve peak gradient measures 9.1 mmHg. Aortic valve area, by VTI measures 3.13 cm. Pulmonic Valve: The pulmonic valve was  normal in structure. Pulmonic valve regurgitation is not visualized. No evidence of pulmonic stenosis. Aorta: The aortic root is normal in size and structure. Venous: The inferior vena cava is normal in size with greater than 50% respiratory variability, suggesting right atrial pressure of 3 mmHg. IAS/Shunts: No atrial level shunt detected by color flow Doppler.  LEFT VENTRICLE PLAX 2D LVIDd:         3.60 cm   Diastology LVIDs:         2.20 cm   LV e' medial:    5.44 cm/s LV PW:         1.10 cm   LV E/e' medial:  8.9 LV IVS:        0.80 cm  LV e' lateral:   6.42 cm/s LVOT diam:     2.00 cm   LV E/e' lateral: 7.5 LV SV:         84 LV SV Index:   50 LVOT Area:     3.14 cm  RIGHT VENTRICLE RV Basal diam:  2.30 cm RV S prime:     8.92 cm/s TAPSE (M-mode): 2.2 cm LEFT ATRIUM             Index        RIGHT ATRIUM           Index LA diam:        3.90 cm 2.32 cm/m   RA Area:     11.70 cm LA Vol (A2C):   38.5 ml 22.90 ml/m  RA Volume:   26.10 ml  15.52 ml/m LA Vol (A4C):   42.1 ml 25.04 ml/m LA Biplane Vol: 41.4 ml 24.62 ml/m  AORTIC VALVE                     PULMONIC VALVE AV Area (Vmax):    2.52 cm      PV Vmax:        1.23 m/s AV Area (Vmean):   2.68 cm      PV Vmean:       77.600 cm/s AV Area (VTI):     3.13 cm      PV VTI:         0.215 m AV Vmax:           151.00 cm/s   PV Peak grad:   6.1 mmHg AV Vmean:          113.000 cm/s  PV Mean grad:   3.0 mmHg AV VTI:            0.268 m       RVOT Peak grad: 7 mmHg AV Peak Grad:      9.1 mmHg AV Mean Grad:      6.0 mmHg LVOT Vmax:         121.00 cm/s LVOT Vmean:        96.500 cm/s LVOT VTI:          0.267 m LVOT/AV VTI ratio: 1.00  AORTA Ao Root diam: 2.90 cm MITRAL VALVE               TRICUSPID VALVE MV Area (PHT): 2.32 cm    TR Peak grad:   15.2 mmHg MV Area VTI:   4.07 cm    TR Vmax:        195.00 cm/s MV Peak grad:  6.2 mmHg MV Mean grad:  2.0 mmHg    SHUNTS MV Vmax:       1.25 m/s    Systemic VTI:  0.27 m MV Vmean:      65.0 cm/s   Systemic Diam: 2.00 cm MV  Decel Time: 327 msec    Pulmonic VTI:  0.222 m MV E velocity: 48.40 cm/s MV A velocity: 83.60 cm/s MV E/A ratio:  0.58 Donnelly Angelica Electronically signed by Donnelly Angelica Signature Date/Time: 12/03/2021/1:25:48 PM    Final    CT HEAD CODE STROKE WO CONTRAST  Result Date: 12/02/2021 CLINICAL DATA:  Code stroke.  Weakness and fatigue EXAM: CT HEAD WITHOUT CONTRAST TECHNIQUE: Contiguous axial images were obtained from the base of the skull through the vertex without intravenous contrast. COMPARISON:  11/21/2021. FINDINGS: Brain: No evidence of acute infarction, hemorrhage, cerebral edema,  mass, mass effect, or midline shift. Ventricles and sulci are normal for age. No extra-axial fluid collection. Periventricular white matter changes, likely the sequela of chronic small vessel ischemic disease. Basal ganglia calcifications. Vascular: No hyperdense vessel or unexpected calcification. Skull: Normal. Negative for fracture or focal lesion. Sinuses/Orbits: No acute finding. Other: The mastoid air cells are well aerated. ASPECTS [facility] Stroke Program Early CT Score) - Ganglionic level infarction (caudate, lentiform nuclei, internal capsule, insula, M1-M3 cortex): 7 - Supraganglionic infarction (M4-M6 cortex): 3 Total score (0-10 with 10 being normal): 10 IMPRESSION: 1. No acute intracranial process. 2. ASPECTS is 10. Code stroke imaging results were communicated on 12/02/2021 at 3:40 pm to provider Dr. Theda Sers via secure text paging. Electronically Signed   By: Merilyn Baba M.D.   On: 12/02/2021 15:41   CT ANGIO HEAD NECK W WO CM (CODE STROKE)  Result Date: 12/02/2021 CLINICAL DATA:  Weakness and fatigue EXAM: CT ANGIOGRAPHY HEAD AND NECK TECHNIQUE: Multidetector CT imaging of the head and neck was performed using the standard protocol during bolus administration of intravenous contrast. Multiplanar CT image reconstructions and MIPs were obtained to evaluate the vascular anatomy. Carotid stenosis measurements (when  applicable) are obtained utilizing NASCET criteria, using the distal internal carotid diameter as the denominator. CONTRAST:  26mL OMNIPAQUE IOHEXOL 350 MG/ML SOLN COMPARISON:  08/12/2021 CTA head neck, correlation is made with same day CT head. FINDINGS: CT HEAD FINDINGS For noncontrast findings, please see same day CT head. CTA NECK FINDINGS Aortic arch: Standard branching. Imaged portion shows no evidence of aneurysm or dissection. No significant stenosis of the major arch vessel origins. Aortic atherosclerosis, which extends into the branch vessels. Right carotid system: Calcified plaque at the bifurcation and in the proximal right ICA, which causes less than 50% stenosis. No dissection or occlusion. Left carotid system: No dissection or occlusion. Minimal calcified plaque at bifurcation, without significant stenosis. Vertebral arteries: Codominant. No evidence of dissection, stenosis (50% or greater) or occlusion. Skeleton: Degenerative changes in the cervical spine. No acute osseous abnormality. Other neck: Prior left thyroidectomy.  Otherwise negative. Upper chest: Focal pulmonary opacity or pleural effusion. Review of the MIP images confirms the above findings CTA HEAD FINDINGS Anterior circulation: Both internal carotid arteries are patent to the termini, with moderate calcifications but without significant stenosis. A1 segments patent. Normal anterior communicating artery. Anterior cerebral arteries are patent to their distal aspects. No M1 stenosis or occlusion. Normal MCA bifurcations. Moderate stenosis again noted in a left M2 branch. Distal MCA branches perfused, although there is mildly more robust perfusion on the right compared to the left, without additional focal stenosis. Posterior circulation: Vertebral arteries patent to the vertebrobasilar junction without stenosis. Basilar patent to its distal aspect. Superior cerebellar arteries patent bilaterally. Bilateral P1 segments originate from the  basilar artery, although the right posterior communicating artery is present. PCAs perfused to their distal aspects without stenosis. The left posterior communicating artery is not visualized. Venous sinuses: As permitted by contrast timing, patent. Anatomic variants: None significant Review of the MIP images confirms the above findings IMPRESSION: 1.  No intracranial large vessel occlusion or significant stenosis. 2.  No hemodynamically significant stenosis in the neck. 3. 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 Code stroke imaging results were communicated on 12/02/2021 at 4:05 pm to provider Dr. Theda Sers via secure text paging. Electronically Signed   By: Merilyn Baba M.D.   On: 12/02/2021 16:05     Subjective: Alert, report improvement of abdominal pain.  Decline CT scan.,   Discharge Exam: Vitals:   12/05/21 0014 12/05/21 0722  BP: (!) 102/54 128/78  Pulse: 74 61  Resp: 19 16  Temp: 98 F (36.7 C) 98.4 F (36.9 C)  SpO2: 92% 98%     General: Pt is alert, awake, not in acute distress Cardiovascular: RRR, S1/S2 +, no rubs, no gallops Respiratory: CTA bilaterally, no wheezing, no rhonchi Abdominal: Soft, NT, ND, bowel sounds + Extremities: no edema, no cyanosis    The results of significant diagnostics from this hospitalization (including imaging, microbiology, ancillary and laboratory) are listed below for reference.     Microbiology: Recent Results (from the past 240 hour(s))  Culture, blood (Routine x 2)     Status: None (Preliminary result)   Collection Time: 12/02/21  3:50 PM   Specimen: BLOOD  Result Value Ref Range Status   Specimen Description BLOOD LAC  Final   Special Requests BOTTLES DRAWN AEROBIC AND ANAEROBIC BCAV  Final   Culture   Final    NO GROWTH 3 DAYS Performed at [facility], [address] [place], [place] [address]    Report Status PENDING  Incomplete  Resp Panel by RT-PCR (Flu A&B, Covid) Nasopharyngeal Swab      Status: None   Collection Time: 12/02/21  3:51 PM   Specimen: Nasopharyngeal Swab; Nasopharyngeal(NP) swabs in vial transport medium  Result Value Ref Range Status   SARS Coronavirus 2 by RT PCR NEGATIVE NEGATIVE Final    Comment: (NOTE) SARS-CoV-2 target nucleic acids are NOT DETECTED.  The SARS-CoV-2 RNA is generally detectable in upper respiratory specimens during the acute phase of infection. The lowest concentration of SARS-CoV-2 viral copies this assay can detect is 138 copies/mL. A negative result does not preclude SARS-Cov-2 infection and should not be used as the sole basis for treatment or other patient management decisions. A negative result may occur with  improper specimen collection/handling, submission of specimen other than nasopharyngeal swab, presence of viral mutation(s) within the areas targeted by this assay, and inadequate number of viral copies(<138 copies/mL). A negative result must be combined with clinical observations, patient history, and epidemiological information. The expected result is Negative.  Fact Sheet for Patients:  EntrepreneurPulse.com.au  Fact Sheet for Healthcare Providers:  IncredibleEmployment.be  This test is no t yet approved or cleared by the [place] FDA and  has been authorized for detection and/or diagnosis of SARS-CoV-2 by FDA under an Emergency Use Authorization (EUA). This EUA will remain  in effect (meaning this test can be used) for the duration of the COVID-19 declaration under Section 564(b)(1) of the Act, 21 U.S.C.section 360bbb-3(b)(1), unless the authorization is terminated  or revoked sooner.       Influenza A by PCR NEGATIVE NEGATIVE Final   Influenza B by PCR NEGATIVE NEGATIVE Final    Comment: (NOTE) The Xpert Xpress SARS-CoV-2/FLU/RSV plus assay is intended as an aid in the diagnosis of influenza from Nasopharyngeal swab specimens and should not be used as a sole basis  for treatment. Nasal washings and aspirates are unacceptable for Xpert Xpress SARS-CoV-2/FLU/RSV testing.  Fact Sheet for Patients: EntrepreneurPulse.com.au  Fact Sheet for Healthcare Providers: IncredibleEmployment.be  This test is not yet approved or cleared by the [place] FDA and has been authorized for detection and/or diagnosis of SARS-CoV-2 by FDA under an Emergency Use Authorization (EUA). This EUA will remain in effect (meaning this test can be used) for the duration of the COVID-19 declaration under Section 564(b)(1) of the  Act, 21 U.S.C. section 360bbb-3(b)(1), unless the authorization is terminated or revoked.  Performed at [facility], [address] [place], [place] [address]   Urine Culture     Status: Abnormal   Collection Time: 12/02/21  3:51 PM   Specimen: Urine, Clean Catch  Result Value Ref Range Status   Specimen Description   Final    URINE, CLEAN CATCH Performed at [facility], [address] [place], [place] [address]    Special Requests   Final    NONE Performed at [facility], [address] [place], [place] [address]    Culture MULTIPLE SPECIES PRESENT, SUGGEST RECOLLECTION (A)  Final   Report Status 12/03/2021 FINAL  Final  Culture, blood (Routine x 2)     Status: None (Preliminary result)   Collection Time: 12/02/21  4:00 PM   Specimen: BLOOD  Result Value Ref Range Status   Specimen Description BLOOD [facility]  Final   Special Requests BOTTLES DRAWN AEROBIC AND ANAEROBIC BCAV  Final   Culture   Final    NO GROWTH 3 DAYS Performed at [facility], [address] [place],  [address]    Report Status PENDING  Incomplete     Labs: BNP (last 3 results) Recent Labs    06/29/21 1003  BNP 96.7   Basic Metabolic Panel: Recent Labs  Lab 12/02/21 1551 12/04/21 0430 12/05/21 0525  NA 132* 137 139  K 4.0 4.1 4.2  CL 98 103 106  CO2 25 25 26   GLUCOSE  102* 103* 97  BUN 11 24* 24*  CREATININE 0.77 0.99 1.01*  CALCIUM 9.7 9.4 8.9  MG  --  1.9 1.8   Liver Function Tests: Recent Labs  Lab 12/02/21 1551 12/04/21 0431  AST 18 18  ALT 10 9  ALKPHOS 48 39  BILITOT 0.6 0.6  PROT 6.9 6.1*  ALBUMIN 3.8 3.6   Recent Labs  Lab 12/04/21 0430  LIPASE 40   No results for input(s): AMMONIA in the last 168 hours. CBC: Recent Labs  Lab 12/02/21 1551 12/04/21 0430 12/05/21 0525  WBC 5.8 6.1 6.7  NEUTROABS 3.9  --   --   HGB 11.8* 10.9* 10.2*  HCT 36.2 33.8* 32.2*  MCV 89.4 88.9 91.7  PLT 262 263 234   Cardiac Enzymes: No results for input(s): CKTOTAL, CKMB, CKMBINDEX, TROPONINI in the last 168 hours. BNP: Invalid input(s): POCBNP CBG: Recent Labs  Lab 12/02/21 1525  GLUCAP 105*   D-Dimer No results for input(s): DDIMER in the last 72 hours. Hgb A1c Recent Labs    12/03/21 0232  HGBA1C 5.3   Lipid Profile Recent Labs    12/03/21 0232  CHOL 133  HDL 50  LDLCALC 68  TRIG 74  CHOLHDL 2.7   Thyroid function studies Recent Labs    12/02/21 1551  TSH 0.512   Anemia work up Recent Labs    12/02/21 1551  VITAMINB12 235   Urinalysis    Component Value Date/Time   COLORURINE COLORLESS (A) 12/02/2021 1551   APPEARANCEUR CLEAR (A) 12/02/2021 1551   APPEARANCEUR Hazy 07/25/2014 1350   LABSPEC 1.019 12/02/2021 1551   LABSPEC 1.020 07/25/2014 1350   PHURINE 8.0 12/02/2021 1551   GLUCOSEU NEGATIVE 12/02/2021 1551   GLUCOSEU Negative 07/25/2014 1350   HGBUR SMALL (A) 12/02/2021 1551   BILIRUBINUR NEGATIVE 12/02/2021 1551   BILIRUBINUR Negative 07/25/2014 1350   KETONESUR 5 (A) 12/02/2021 1551   PROTEINUR 30 (A) 12/02/2021 1551  NITRITE NEGATIVE 12/02/2021 1551   LEUKOCYTESUR TRACE (A) 12/02/2021 1551   LEUKOCYTESUR 2+ 07/25/2014 1350   Sepsis Labs Invalid input(s): PROCALCITONIN,  WBC,  LACTICIDVEN Microbiology Recent Results (from the past 240 hour(s))  Culture, blood (Routine x 2)     Status: None  (Preliminary result)   Collection Time: 12/02/21  3:50 PM   Specimen: BLOOD  Result Value Ref Range Status   Specimen Description BLOOD LAC  Final   Special Requests BOTTLES DRAWN AEROBIC AND ANAEROBIC BCAV  Final   Culture   Final    NO GROWTH 3 DAYS Performed at [facility], [address], [place], [place] [address]    Report Status PENDING  Incomplete  Resp Panel by RT-PCR (Flu A&B, Covid) Nasopharyngeal Swab     Status: None   Collection Time: 12/02/21  3:51 PM   Specimen: Nasopharyngeal Swab; Nasopharyngeal(NP) swabs in vial transport medium  Result Value Ref Range Status   SARS Coronavirus 2 by RT PCR NEGATIVE NEGATIVE Final    Comment: (NOTE) SARS-CoV-2 target nucleic acids are NOT DETECTED.  The SARS-CoV-2 RNA is generally detectable in upper respiratory specimens during the acute phase of infection. The lowest concentration of SARS-CoV-2 viral copies this assay can detect is 138 copies/mL. A negative result does not preclude SARS-Cov-2 infection and should not be used as the sole basis for treatment or other patient management decisions. A negative result may occur with  improper specimen collection/handling, submission of specimen other than nasopharyngeal swab, presence of viral mutation(s) within the areas targeted by this assay, and inadequate number of viral copies(<138 copies/mL). A negative result must be combined with clinical observations, patient history, and epidemiological information. The expected result is Negative.  Fact Sheet for Patients:  EntrepreneurPulse.com.au  Fact Sheet for Healthcare Providers:  IncredibleEmployment.be  This test is no t yet approved or cleared by the [place] FDA and  has been authorized for detection and/or diagnosis of SARS-CoV-2 by FDA under an Emergency Use Authorization (EUA). This EUA will remain  in effect (meaning this test can be used) for the duration of  the COVID-19 declaration under Section 564(b)(1) of the Act, 21 U.S.C.section 360bbb-3(b)(1), unless the authorization is terminated  or revoked sooner.       Influenza A by PCR NEGATIVE NEGATIVE Final   Influenza B by PCR NEGATIVE NEGATIVE Final    Comment: (NOTE) The Xpert Xpress SARS-CoV-2/FLU/RSV plus assay is intended as an aid in the diagnosis of influenza from Nasopharyngeal swab specimens and should not be used as a sole basis for treatment. Nasal washings and aspirates are unacceptable for Xpert Xpress SARS-CoV-2/FLU/RSV testing.  Fact Sheet for Patients: EntrepreneurPulse.com.au  Fact Sheet for Healthcare Providers: IncredibleEmployment.be  This test is not yet approved or cleared by the [place] FDA and has been authorized for detection and/or diagnosis of SARS-CoV-2 by FDA under an Emergency Use Authorization (EUA). This EUA will remain in effect (meaning this test can be used) for the duration of the COVID-19 declaration under Section 564(b)(1) of the Act, 21 U.S.C. section 360bbb-3(b)(1), unless the authorization is terminated or revoked.  Performed at [facility], [address], [place], [place] [address]   Urine Culture     Status: Abnormal   Collection Time: 12/02/21  3:51 PM   Specimen: Urine, Clean Catch  Result Value Ref Range Status   Specimen Description   Final    URINE, CLEAN CATCH Performed at [facility], [place], [place]  [address]    Special Requests   Final    NONE Performed at [facility], [address] [place], [place] [address]    Culture MULTIPLE SPECIES PRESENT, SUGGEST RECOLLECTION (A)  Final   Report Status 12/03/2021 FINAL  Final  Culture, blood (Routine x 2)     Status: None (Preliminary result)   Collection Time: 12/02/21  4:00 PM   Specimen: BLOOD  Result Value Ref Range Status   Specimen Description BLOOD [facility]  Final   Special Requests  BOTTLES DRAWN AEROBIC AND ANAEROBIC BCAV  Final   Culture   Final    NO GROWTH 3 DAYS Performed at [facility], [address], [place], [place] [address]    Report Status PENDING  Incomplete     Time coordinating discharge: 40 minutes  SIGNED:   Elmarie Shiley, MD  [facility]

## 2021-12-06 DIAGNOSIS — G459 Transient cerebral ischemic attack, unspecified: Secondary | ICD-10-CM | POA: Diagnosis not present

## 2021-12-06 LAB — CBC
HCT: 31.5 % — ABNORMAL LOW (ref 36.0–46.0)
Hemoglobin: 10 g/dL — ABNORMAL LOW (ref 12.0–15.0)
MCH: 29 pg (ref 26.0–34.0)
MCHC: 31.7 g/dL (ref 30.0–36.0)
MCV: 91.3 fL (ref 80.0–100.0)
Platelets: 227 10*3/uL (ref 150–400)
RBC: 3.45 MIL/uL — ABNORMAL LOW (ref 3.87–5.11)
RDW: 16.4 % — ABNORMAL HIGH (ref 11.5–15.5)
WBC: 6.9 10*3/uL (ref 4.0–10.5)
nRBC: 0 % (ref 0.0–0.2)

## 2021-12-06 LAB — BASIC METABOLIC PANEL
Anion gap: 7 (ref 5–15)
BUN: 19 mg/dL (ref 8–23)
CO2: 24 mmol/L (ref 22–32)
Calcium: 8.9 mg/dL (ref 8.9–10.3)
Chloride: 104 mmol/L (ref 98–111)
Creatinine, Ser: 0.82 mg/dL (ref 0.44–1.00)
GFR, Estimated: >60 mL/min (ref >60)
Glucose, Bld: 89 mg/dL (ref 70–99)
Potassium: 4.1 mmol/L (ref 3.5–5.1)
Sodium: 135 mmol/L (ref 135–145)

## 2021-12-06 LAB — MAGNESIUM: Magnesium: 2.3 mg/dL (ref 1.7–2.4)

## 2021-12-06 NOTE — Progress Notes (Signed)
EMS herr to pick up patient. Packet given to EMS and patient ready for discharge to [facility]. RN attempted to call report for a second time to [facility], but no answer.

## 2021-12-06 NOTE — Progress Notes (Signed)
Attempt made to call report to [facility]. No answer.

## 2021-12-06 NOTE — Discharge Summary (Signed)
Physician Discharge Summary  Erin Sutton IRJ:[id number] DOB: August 04, 1941 DOA: 12/02/2021  PCP: Baxter Hire, MD  Admit date: 12/02/2021 Discharge date: 12/06/2021  Admitted From: Home  Disposition:  SNF  Recommendations for Outpatient Follow-up:  Follow up with PCP in 1-2 weeks Please obtain BMP/CBC in one week Adjust BP medications as needed.  Needs follow up with GI for epigastric pain  Follow GYN for vaginal bleed.     Discharge Condition: Stable.  CODE STATUS: Full Code Diet recommendation: Heart Healthy  Brief/Interim Summary: 81 year old with past medical history significant for A. fib on Eliquis, hypertension, dementia, generalized anxiety disorder, insomnia, GERD who presents to the ED complaining of weakness fatigue and chest pressure    1-Stroke was ruled out. MRI was negative for stroke Neurology consulted:   2-Elevation of troponin secondary to demand ischemia: Non-STEMI was ruled out. Elevation of troponin likely related to elevation of blood pressure. Receive heparin drip.   3-Generalized weakness: Report intolerance to metoprolol.  Cardiology would like for patient to be on metoprolol discussed with patient patient agreed to stay with metoprolol   Hypertension: Systolic blood pressure in the 200 range on admission. Had some low blood pressures yesterday amlodipine held Continue with metoprolol. Amlodipine PRN for elevated BP  Hypertensive encephalopathy/  Improved,.    Hypothyroidism: Continue with Synthroid History of diastolic heart failure: Appears euvolemic Atrial fibrillation on anticoagulation: Continue with Eliquis Continue with metoprolol   Hyperlipidemia: Continue with atorvastatin Vaginal bleeding: She will need to follow-up with a gynecologist as an outpatient   Epigastric pain: started  Protonix. Liver function test and lipase normal. Pain improved. Decline CT scan.    Stable for discharge     Discharge Diagnoses:  Principal  Problem:   TIA (transient ischemic attack) Active Problems:   Confusion   Hypothyroidism   HTN (hypertension)   GERD (gastroesophageal reflux disease)   Anxiety   Abnormal gait   Hyperlipidemia   Sleep apnea   Dementia (HCC)   Generalized anxiety disorder   Paroxysmal atrial fibrillation (HCC)   Chronic anticoagulation - on eliquis for afib   Elevated troponin   Weakness    Discharge Instructions  Discharge Instructions     Diet - low sodium heart healthy   Complete by: As directed    Diet - low sodium heart healthy   Complete by: As directed    Increase activity slowly   Complete by: As directed    Increase activity slowly   Complete by: As directed       Allergies as of 12/06/2021       Reactions   Ace Inhibitors Cough   Levofloxacin    Penicillin G Other (See Comments)   Tape Other (See Comments)   Other reaction(s): Other (See Comments)   Amoxicillin Rash   Has patient had a PCN reaction causing immediate rash, facial/tongue/throat swelling, SOB or lightheadedness with hypotension: No Has patient had a PCN reaction causing severe rash involving mucus membranes or skin necrosis: No Has patient had a PCN reaction that required hospitalization: No Has patient had a PCN reaction occurring within the last 10 years: No If all of the above answers are "NO", then may proceed with Cephalosporin use.   Benicar [olmesartan] Rash   Itching rash   Codeine Rash   Codeine Sulfate Rash   Levaquin [levofloxacin In D5w] Rash   Alters mental status   Penicillin V Potassium Rash   Has patient had a PCN reaction causing immediate rash, facial/tongue/throat  swelling, SOB or lightheadedness with hypotension: No Has patient had a PCN reaction causing severe rash involving mucus membranes or skin necrosis: No Has patient had a PCN reaction that required hospitalization: No Has patient had a PCN reaction occurring within the last 10 years: No If all of the above answers are "NO",  then may proceed with Cephalosporin use.   Sulfa Antibiotics Rash        Medication List     STOP taking these medications    acetaminophen 500 MG tablet Commonly known as: TYLENOL   cefUROXime 250 MG tablet Commonly known as: CEFTIN   cyanocobalamin 1000 MCG/ML injection Commonly known as: (VITAMIN B-12)   LORazepam 0.5 MG tablet Commonly known as: ATIVAN   neomycin-polymyxin b-dexamethasone 3.5-10000-0.1 Oint Commonly known as: MAXITROL   nitrofurantoin (macrocrystal-monohydrate) 100 MG capsule Commonly known as: MACROBID   ondansetron 8 MG disintegrating tablet Commonly known as: ZOFRAN-ODT   oxyCODONE 5 MG immediate release tablet Commonly known as: Oxy IR/ROXICODONE   phenazopyridine 200 MG tablet Commonly known as: PYRIDIUM   spironolactone 25 MG tablet Commonly known as: ALDACTONE   Zinc Oxide 40 % Pste       TAKE these medications    amLODipine 2.5 MG tablet Commonly known as: NORVASC Take 1 tablet (2.5 mg total) by mouth daily as needed (for SBP>180).   apixaban 5 MG Tabs tablet Commonly known as: ELIQUIS Take 1 tablet (5 mg total) by mouth 2 (two) times daily.   aspirin 81 MG chewable tablet Chew 1 tablet (81 mg total) by mouth daily.   atorvastatin 40 MG tablet Commonly known as: LIPITOR Take 40 mg by mouth daily.   diclofenac Sodium 1 % Gel Commonly known as: VOLTAREN Apply 1 application topically daily.   HYDROcodone-acetaminophen 5-325 MG tablet Commonly known as: NORCO/VICODIN Take 1 tablet by mouth every 4 (four) hours.   levothyroxine 50 MCG tablet Commonly known as: SYNTHROID Take 50 mcg by mouth daily before breakfast.   melatonin 3 MG Tabs tablet Take 1 tablet by mouth at bedtime as needed.   metoprolol tartrate 25 MG tablet Commonly known as: LOPRESSOR Take 1 tablet (25 mg total) by mouth in the morning and at bedtime.   pantoprazole 40 MG tablet Commonly known as: PROTONIX Take 1 tablet (40 mg total) by mouth 2  (two) times daily. What changed: when to take this   polyethylene glycol powder 17 GM/SCOOP powder Commonly known as: GLYCOLAX/MIRALAX Take 17 g by mouth daily.   senna-docusate 8.6-50 MG tablet Commonly known as: Senokot-S Take 2 tablets by mouth 2 (two) times daily as needed for mild constipation.   SYSTANE OP Place 1 drop into both eyes 2 (two) times daily.   thiamine 100 MG tablet Take 1 tablet by mouth daily.        Contact information for after-discharge care     Destination     [facility] Preferred SNF .   Service: Skilled Nursing Contact information: [place] [id number] [phone]                    Allergies  Allergen Reactions   Ace Inhibitors Cough   Levofloxacin    Penicillin G Other (See Comments)   Tape Other (See Comments)    Other reaction(s): Other (See Comments)   Amoxicillin Rash    Has patient had a PCN reaction causing immediate rash, facial/tongue/throat swelling, SOB or lightheadedness with hypotension: No Has patient had a  PCN reaction causing severe rash involving mucus membranes or skin necrosis: No Has patient had a PCN reaction that required hospitalization: No Has patient had a PCN reaction occurring within the last 10 years: No If all of the above answers are "NO", then may proceed with Cephalosporin use.    Benicar [Olmesartan] Rash    Itching rash   Codeine Rash   Codeine Sulfate Rash   Levaquin [Levofloxacin In D5w] Rash    Alters mental status   Penicillin V Potassium Rash    Has patient had a PCN reaction causing immediate rash, facial/tongue/throat swelling, SOB or lightheadedness with hypotension: No Has patient had a PCN reaction causing severe rash involving mucus membranes or skin necrosis: No Has patient had a PCN reaction that required hospitalization: No Has patient had a PCN reaction occurring within the last 10 years: No If all of the above answers are  "NO", then may proceed with Cephalosporin use.    Sulfa Antibiotics Rash    Consultations: Cardiology    Procedures/Studies: CT HEAD WO CONTRAST (5MM)  Result Date: 11/21/2021 CLINICAL DATA:  Mental status change, persistent or worsening. Seen here yesterday for hypertension. Severe headache. EXAM: CT HEAD WITHOUT CONTRAST TECHNIQUE: Contiguous axial images were obtained from the base of the skull through the vertex without intravenous contrast. COMPARISON:  11/20/2021 FINDINGS: Brain: No evidence of acute infarction, hemorrhage, hydrocephalus, extra-axial collection or mass lesion/mass effect. Mild cerebral atrophy. Low-attenuation changes in the deep white matter most likely representing small vessel ischemia. Basal ganglia calcifications. No change since prior study. Vascular: Moderate intracranial arterial vascular calcifications. Skull: Calvarium appears intact. Sinuses/Orbits: Paranasal sinuses and mastoid air cells are clear. Other: No significant change since prior study. IMPRESSION: No acute intracranial abnormalities. Mild chronic atrophy and small vessel ischemic changes. Electronically Signed   By: Lucienne Capers M.D.   On: 11/21/2021 15:36   CT HEAD WO CONTRAST (5MM)  Result Date: 11/20/2021 CLINICAL DATA:  Altered mental status. EXAM: CT HEAD WITHOUT CONTRAST TECHNIQUE: Contiguous axial images were obtained from the base of the skull through the vertex without intravenous contrast. COMPARISON:  Head CT dated 11/11/2021. FINDINGS: Brain: The ventricles and sulci appropriate size for patient's age. Mild periventricular and deep white matter chronic microvascular ischemic changes noted. There is no acute intracranial hemorrhage. No mass effect or midline shift. No extra-axial fluid collection. Vascular: No hyperdense vessel or unexpected calcification. Skull: Normal. Negative for fracture or focal lesion. Sinuses/Orbits: No acute finding. Other: None IMPRESSION: 1. No acute  intracranial pathology. 2. Mild chronic microvascular ischemic changes. Electronically Signed   By: Anner Crete M.D.   On: 11/20/2021 21:19   CT Head Wo Contrast  Result Date: 11/11/2021 CLINICAL DATA:  Weakness.  Mental status changes. EXAM: CT HEAD WITHOUT CONTRAST TECHNIQUE: Contiguous axial images were obtained from the base of the skull through the vertex without intravenous contrast. COMPARISON:  08/17/2021 FINDINGS: Brain: No mass lesion, hemorrhage, hydrocephalus, acute infarct, intra-axial, or extra-axial fluid collection. Vascular: No hyperdense vessel or unexpected calcification. Intracranial atherosclerosis. Skull: No significant soft tissue swelling.  No skull fracture. Sinuses/Orbits: Normal imaged portions of the orbits and globes. Clear paranasal sinuses and mastoid air cells. Other: None. IMPRESSION: No acute intracranial abnormality. Electronically Signed   By: Abigail Miyamoto M.D.   On: 11/11/2021 12:55   MR BRAIN WO CONTRAST  Result Date: 12/02/2021 CLINICAL DATA:  Initial evaluation for neuro deficit, stroke suspected. EXAM: MRI HEAD WITHOUT CONTRAST TECHNIQUE: Multiplanar, multiecho pulse sequences of the brain and  surrounding structures were obtained without intravenous contrast. COMPARISON:  Prior CTs from earlier the same day. FINDINGS: Brain: Cerebral volume within normal limits. Minimal chronic microvascular ischemic disease noted involving the periventricular white matter. Small remote cortical/subcortical infarct noted at the parasagittal left parieto-occipital region. Remote lacunar infarct present at the left thalamus. No abnormal foci of restricted diffusion to suggest acute or subacute ischemia. Gray-white matter differentiation maintained. No other areas of chronic cortical infarction. No evidence for acute intracranial hemorrhage. Single chronic microhemorrhage noted at the anterior left frontal lobe, of doubtful significance in isolation. No mass lesion, midline shift or  mass effect. No hydrocephalus or extra-axial fluid collection. Pituitary gland suprasellar region normal. Vascular: Major intracranial vascular flow voids are maintained. Skull and upper cervical spine: Craniocervical junction within normal limits. Bone marrow signal intensity normal. No scalp soft tissue abnormality. Sinuses/Orbits: Patient status post bilateral ocular lens replacement. Globes and orbital soft tissues demonstrate no acute finding. Paranasal sinuses are largely clear. No significant mastoid effusion. Other: None. IMPRESSION: 1. No acute intracranial abnormality. 2. 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 3. Underlying minimal for age chronic microvascular ischemic disease. Electronically Signed   By: Jeannine Boga M.D.   On: 12/02/2021 23:51   DG Chest Port 1 View  Result Date: 12/02/2021 CLINICAL DATA:  Weakness, fatigue EXAM: PORTABLE CHEST 1 VIEW COMPARISON:  11/21/2021 FINDINGS: Stable cardiomediastinal contours. Aortic atherosclerosis. No focal airspace consolidation, pleural effusion, or pneumothorax. Degenerative changes of the shoulders, left worse than right. IMPRESSION: No active disease. Electronically Signed   By: Davina Poke D.O.   On: 12/02/2021 16:18   DG Chest Port 1 View  Result Date: 11/21/2021 CLINICAL DATA:  81 year old female with a history chest pain EXAM: PORTABLE CHEST 1 VIEW COMPARISON:  11/20/2021 FINDINGS: Cardiomediastinal silhouette unchanged in size and contour. Calcifications of the aortic arch. No evidence of central vascular congestion. No interlobular septal thickening. Lung volumes remain low. Coarsened interstitial markings with no new confluent airspace disease. Similar appearance of asymmetric elevation of the right hemidiaphragm. No pneumothorax or pleural effusion. No acute displaced fracture. Degenerative changes of the spine. IMPRESSION: Negative  for acute cardiopulmonary disease. Electronically Signed   By: Corrie Mckusick D.O.   On: 11/21/2021 15:53   DG Chest Portable 1 View  Result Date: 11/20/2021 CLINICAL DATA:  Hypertension EXAM: PORTABLE CHEST 1 VIEW COMPARISON:  11/11/2021 FINDINGS: Shallow inspiration with low lung volumes. Mild bibasilar atelectasis/scarring. No pleural effusion. No pneumothorax. IMPRESSION: Mild bibasilar atelectasis/scarring. Electronically Signed   By: Macy Mis M.D.   On: 11/20/2021 16:17   DG Chest Portable 1 View  Result Date: 11/11/2021 CLINICAL DATA:  Weakness, altered mental status, recent knee surgery EXAM: PORTABLE CHEST 1 VIEW COMPARISON:  Portable exam 1201 hours compared to 08/29/2021 FINDINGS: Normal heart size, mediastinal contours, and pulmonary vascularity. Atherosclerotic calcification aorta. Minimal atelectasis LEFT base. No definite infiltrate, pleural effusion, or pneumothorax. Bones demineralized IMPRESSION: Minimal LEFT basilar atelectasis. Aortic Atherosclerosis (ICD10-I70.0). Electronically Signed   By: Lavonia Dana M.D.   On: 11/11/2021 12:13   ECHOCARDIOGRAM COMPLETE  Result Date: 12/03/2021    ECHOCARDIOGRAM REPORT   Patient Name:   Erin Sutton Date of Exam: 12/03/2021 Medical Rec #:  [id number]       Height:       63.0 in Accession #:    [id number]      Weight:       144.0 lb Date of Birth:  16-Jan-1941       BSA:          1.681 m Patient Age:    81 years        BP:           124/101 mmHg Patient Gender: F               HR:           75 bpm. Exam Location:  [facility] Procedure: 2D Echo, Cardiac Doppler and Color Doppler Indications:     Stroke I63.9  History:         Patient has prior history of Echocardiogram examinations, most                  recent 02/28/2021. Risk Factors:Hypertension. CKD, history of DVT                  and pulmonary embolism.  Sonographer:     Sherrie Sport Referring Phys:  [id number] AMY N COX Diagnosing Phys: Donnelly Angelica  Sonographer Comments: Suboptimal apical window.  IMPRESSIONS  1. Left ventricular ejection fraction, by estimation, is 60 to 65%. The left ventricle has normal function. The left ventricle has no regional wall motion abnormalities. Left ventricular diastolic parameters were normal.  2. Right ventricular systolic function is normal. The right ventricular size is normal.  3. The mitral valve is normal in structure. No evidence of mitral valve regurgitation. No evidence of mitral stenosis.  4. The aortic valve is normal in structure. Aortic valve regurgitation is not visualized. No aortic stenosis is present.  5. The inferior vena cava is normal in size with greater than 50% respiratory variability, suggesting right atrial pressure of 3 mmHg. FINDINGS  Left Ventricle: Left ventricular ejection fraction, by estimation, is 60 to 65%. The left ventricle has normal function. The left ventricle has no regional wall motion abnormalities. The left ventricular internal cavity size was normal in size. There is  no left ventricular hypertrophy. Left ventricular diastolic parameters were normal. Right Ventricle: The right ventricular size is normal. No increase in right ventricular wall thickness. Right ventricular systolic function is normal. Left Atrium: Left atrial size was normal in size. Right Atrium: Right atrial size was normal in size. Pericardium: There is no evidence of pericardial effusion. Mitral Valve: The mitral valve is normal in structure. No evidence of mitral valve regurgitation. No evidence of mitral valve stenosis. MV peak gradient, 6.2 mmHg. The mean mitral valve gradient is 2.0 mmHg. Tricuspid Valve: The tricuspid valve is normal in structure. Tricuspid valve regurgitation is not demonstrated. No evidence of tricuspid stenosis. Aortic Valve: The aortic valve is normal in structure. Aortic valve regurgitation is not visualized. No aortic stenosis is present. Aortic valve mean gradient measures 6.0 mmHg. Aortic valve peak gradient measures 9.1 mmHg. Aortic  valve area, by VTI measures 3.13 cm. Pulmonic Valve: The pulmonic valve was normal in structure. Pulmonic valve regurgitation is not visualized. No evidence of pulmonic stenosis. Aorta: The aortic root is normal in size and structure. Venous: The inferior vena cava is normal in size with greater than 50% respiratory variability, suggesting right atrial pressure of 3 mmHg. IAS/Shunts: No atrial level shunt detected by color flow Doppler.  LEFT VENTRICLE PLAX 2D LVIDd:         3.60 cm   Diastology LVIDs:         2.20 cm   LV e' medial:    5.44 cm/s LV PW:  1.10 cm   LV E/e' medial:  8.9 LV IVS:        0.80 cm   LV e' lateral:   6.42 cm/s LVOT diam:     2.00 cm   LV E/e' lateral: 7.5 LV SV:         84 LV SV Index:   50 LVOT Area:     3.14 cm  RIGHT VENTRICLE RV Basal diam:  2.30 cm RV S prime:     8.92 cm/s TAPSE (M-mode): 2.2 cm LEFT ATRIUM             Index        RIGHT ATRIUM           Index LA diam:        3.90 cm 2.32 cm/m   RA Area:     11.70 cm LA Vol (A2C):   38.5 ml 22.90 ml/m  RA Volume:   26.10 ml  15.52 ml/m LA Vol (A4C):   42.1 ml 25.04 ml/m LA Biplane Vol: 41.4 ml 24.62 ml/m  AORTIC VALVE                     PULMONIC VALVE AV Area (Vmax):    2.52 cm      PV Vmax:        1.23 m/s AV Area (Vmean):   2.68 cm      PV Vmean:       77.600 cm/s AV Area (VTI):     3.13 cm      PV VTI:         0.215 m AV Vmax:           151.00 cm/s   PV Peak grad:   6.1 mmHg AV Vmean:          113.000 cm/s  PV Mean grad:   3.0 mmHg AV VTI:            0.268 m       RVOT Peak grad: 7 mmHg AV Peak Grad:      9.1 mmHg AV Mean Grad:      6.0 mmHg LVOT Vmax:         121.00 cm/s LVOT Vmean:        96.500 cm/s LVOT VTI:          0.267 m LVOT/AV VTI ratio: 1.00  AORTA Ao Root diam: 2.90 cm MITRAL VALVE               TRICUSPID VALVE MV Area (PHT): 2.32 cm    TR Peak grad:   15.2 mmHg MV Area VTI:   4.07 cm    TR Vmax:        195.00 cm/s MV Peak grad:  6.2 mmHg MV Mean grad:  2.0 mmHg    SHUNTS MV Vmax:       1.25 m/s     Systemic VTI:  0.27 m MV Vmean:      65.0 cm/s   Systemic Diam: 2.00 cm MV Decel Time: 327 msec    Pulmonic VTI:  0.222 m MV E velocity: 48.40 cm/s MV A velocity: 83.60 cm/s MV E/A ratio:  0.58 Donnelly Angelica Electronically signed by Donnelly Angelica Signature Date/Time: 12/03/2021/1:25:48 PM    Final    CT HEAD CODE STROKE WO CONTRAST  Result Date: 12/02/2021 CLINICAL DATA:  Code stroke.  Weakness and fatigue EXAM: CT HEAD WITHOUT CONTRAST TECHNIQUE: Contiguous axial images were obtained from the base  of the skull through the vertex without intravenous contrast. COMPARISON:  11/21/2021. FINDINGS: Brain: No evidence of acute infarction, hemorrhage, cerebral edema, mass, mass effect, or midline shift. Ventricles and sulci are normal for age. No extra-axial fluid collection. Periventricular white matter changes, likely the sequela of chronic small vessel ischemic disease. Basal ganglia calcifications. Vascular: No hyperdense vessel or unexpected calcification. Skull: Normal. Negative for fracture or focal lesion. Sinuses/Orbits: No acute finding. Other: The mastoid air cells are well aerated. ASPECTS [facility] Stroke Program Early CT Score) - Ganglionic level infarction (caudate, lentiform nuclei, internal capsule, insula, M1-M3 cortex): 7 - Supraganglionic infarction (M4-M6 cortex): 3 Total score (0-10 with 10 being normal): 10 IMPRESSION: 1. No acute intracranial process. 2. ASPECTS is 10. Code stroke imaging results were communicated on 12/02/2021 at 3:40 pm to provider Dr. Theda Sers via secure text paging. Electronically Signed   By: Merilyn Baba M.D.   On: 12/02/2021 15:41   CT ANGIO HEAD NECK W WO CM (CODE STROKE)  Result Date: 12/02/2021 CLINICAL DATA:  Weakness and fatigue EXAM: CT ANGIOGRAPHY HEAD AND NECK TECHNIQUE: Multidetector CT imaging of the head and neck was performed using the standard protocol during bolus administration of intravenous contrast. Multiplanar CT image reconstructions and MIPs were obtained to  evaluate the vascular anatomy. Carotid stenosis measurements (when applicable) are obtained utilizing NASCET criteria, using the distal internal carotid diameter as the denominator. CONTRAST:  38mL OMNIPAQUE IOHEXOL 350 MG/ML SOLN COMPARISON:  08/12/2021 CTA head neck, correlation is made with same day CT head. FINDINGS: CT HEAD FINDINGS For noncontrast findings, please see same day CT head. CTA NECK FINDINGS Aortic arch: Standard branching. Imaged portion shows no evidence of aneurysm or dissection. No significant stenosis of the major arch vessel origins. Aortic atherosclerosis, which extends into the branch vessels. Right carotid system: Calcified plaque at the bifurcation and in the proximal right ICA, which causes less than 50% stenosis. No dissection or occlusion. Left carotid system: No dissection or occlusion. Minimal calcified plaque at bifurcation, without significant stenosis. Vertebral arteries: Codominant. No evidence of dissection, stenosis (50% or greater) or occlusion. Skeleton: Degenerative changes in the cervical spine. No acute osseous abnormality. Other neck: Prior left thyroidectomy.  Otherwise negative. Upper chest: Focal pulmonary opacity or pleural effusion. Review of the MIP images confirms the above findings CTA HEAD FINDINGS Anterior circulation: Both internal carotid arteries are patent to the termini, with moderate calcifications but without significant stenosis. A1 segments patent. Normal anterior communicating artery. Anterior cerebral arteries are patent to their distal aspects. No M1 stenosis or occlusion. Normal MCA bifurcations. Moderate stenosis again noted in a left M2 branch. Distal MCA branches perfused, although there is mildly more robust perfusion on the right compared to the left, without additional focal stenosis. Posterior circulation: Vertebral arteries patent to the vertebrobasilar junction without stenosis. Basilar patent to its distal aspect. Superior cerebellar  arteries patent bilaterally. Bilateral P1 segments originate from the basilar artery, although the right posterior communicating artery is present. PCAs perfused to their distal aspects without stenosis. The left posterior communicating artery is not visualized. Venous sinuses: As permitted by contrast timing, patent. Anatomic variants: None significant Review of the MIP images confirms the above findings IMPRESSION: 1.  No intracranial large vessel occlusion or significant stenosis. 2.  No hemodynamically significant stenosis in the neck. 3. 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 Code stroke imaging results were communicated on 12/02/2021 at 4:05 pm to provider Dr. Theda Sers via secure text paging. Electronically Signed  By: Merilyn Baba M.D.   On: 12/02/2021 16:05     Subjective: Alert, report improvement of abdominal pain. Decline CT scan.,   Discharge Exam: Vitals:   12/06/21 0526 12/06/21 0741  BP: (!) 103/48 126/66  Pulse: (!) 54 (!) 59  Resp: 16 16  Temp: 98 F (36.7 C) 97.9 F (36.6 C)  SpO2: 95% 97%     General: Pt is alert, awake, not in acute distress Cardiovascular: RRR, S1/S2 +, no rubs, no gallops Respiratory: CTA bilaterally, no wheezing, no rhonchi Abdominal: Soft, NT, ND, bowel sounds + Extremities: no edema, no cyanosis    The results of significant diagnostics from this hospitalization (including imaging, microbiology, ancillary and laboratory) are listed below for reference.     Microbiology: Recent Results (from the past 240 hour(s))  Culture, blood (Routine x 2)     Status: None (Preliminary result)   Collection Time: 12/02/21  3:50 PM   Specimen: BLOOD  Result Value Ref Range Status   Specimen Description BLOOD LAC  Final   Special Requests BOTTLES DRAWN AEROBIC AND ANAEROBIC BCAV  Final   Culture   Final    NO GROWTH 4 DAYS Performed at [facility], [address] [place], [place] [address]    Report Status  PENDING  Incomplete  Resp Panel by RT-PCR (Flu A&B, Covid) Nasopharyngeal Swab     Status: None   Collection Time: 12/02/21  3:51 PM   Specimen: Nasopharyngeal Swab; Nasopharyngeal(NP) swabs in vial transport medium  Result Value Ref Range Status   SARS Coronavirus 2 by RT PCR NEGATIVE NEGATIVE Final    Comment: (NOTE) SARS-CoV-2 target nucleic acids are NOT DETECTED.  The SARS-CoV-2 RNA is generally detectable in upper respiratory specimens during the acute phase of infection. The lowest concentration of SARS-CoV-2 viral copies this assay can detect is 138 copies/mL. A negative result does not preclude SARS-Cov-2 infection and should not be used as the sole basis for treatment or other patient management decisions. A negative result may occur with  improper specimen collection/handling, submission of specimen other than nasopharyngeal swab, presence of viral mutation(s) within the areas targeted by this assay, and inadequate number of viral copies(<138 copies/mL). A negative result must be combined with clinical observations, patient history, and epidemiological information. The expected result is Negative.  Fact Sheet for Patients:  EntrepreneurPulse.com.au  Fact Sheet for Healthcare Providers:  IncredibleEmployment.be  This test is no t yet approved or cleared by the [place] FDA and  has been authorized for detection and/or diagnosis of SARS-CoV-2 by FDA under an Emergency Use Authorization (EUA). This EUA will remain  in effect (meaning this test can be used) for the duration of the COVID-19 declaration under Section 564(b)(1) of the Act, 21 U.S.C.section 360bbb-3(b)(1), unless the authorization is terminated  or revoked sooner.       Influenza A by PCR NEGATIVE NEGATIVE Final   Influenza B by PCR NEGATIVE NEGATIVE Final    Comment: (NOTE) The Xpert Xpress SARS-CoV-2/FLU/RSV plus assay is intended as an aid in the diagnosis of  influenza from Nasopharyngeal swab specimens and should not be used as a sole basis for treatment. Nasal washings and aspirates are unacceptable for Xpert Xpress SARS-CoV-2/FLU/RSV testing.  Fact Sheet for Patients: EntrepreneurPulse.com.au  Fact Sheet for Healthcare Providers: IncredibleEmployment.be  This test is not yet approved or cleared by the [place] FDA and has been authorized for detection and/or diagnosis of SARS-CoV-2 by FDA under an Emergency Use Authorization (EUA). This  EUA will remain in effect (meaning this test can be used) for the duration of the COVID-19 declaration under Section 564(b)(1) of the Act, 21 U.S.C. section 360bbb-3(b)(1), unless the authorization is terminated or revoked.  Performed at [facility], [address] [place], [place] [address]   Urine Culture     Status: Abnormal   Collection Time: 12/02/21  3:51 PM   Specimen: Urine, Clean Catch  Result Value Ref Range Status   Specimen Description   Final    URINE, CLEAN CATCH Performed at [facility], [address], [place] [address]    Special Requests   Final    NONE Performed at [facility], [address] [place], [place] [address]    Culture MULTIPLE SPECIES PRESENT, SUGGEST RECOLLECTION (A)  Final   Report Status 12/03/2021 FINAL  Final  Culture, blood (Routine x 2)     Status: None (Preliminary result)   Collection Time: 12/02/21  4:00 PM   Specimen: BLOOD  Result Value Ref Range Status   Specimen Description BLOOD [facility]  Final   Special Requests BOTTLES DRAWN AEROBIC AND ANAEROBIC BCAV  Final   Culture   Final    NO GROWTH 4 DAYS Performed at [facility], [address] [place], [place] [address]    Report Status PENDING  Incomplete     Labs: BNP (last 3 results) Recent Labs    06/29/21 1003  BNP 10.6    Basic Metabolic Panel: Recent Labs  Lab 12/02/21 1551 12/04/21 0430  12/05/21 0525 12/06/21 0653  NA 132* 137 139 135  K 4.0 4.1 4.2 4.1  CL 98 103 106 104  CO2 25 25 26 24   GLUCOSE 102* 103* 97 89  BUN 11 24* 24* 19  CREATININE 0.77 0.99 1.01* 0.82  CALCIUM 9.7 9.4 8.9 8.9  MG  --  1.9 1.8 2.3    Liver Function Tests: Recent Labs  Lab 12/02/21 1551 12/04/21 0431  AST 18 18  ALT 10 9  ALKPHOS 48 39  BILITOT 0.6 0.6  PROT 6.9 6.1*  ALBUMIN 3.8 3.6    Recent Labs  Lab 12/04/21 0430  LIPASE 40    No results for input(s): AMMONIA in the last 168 hours. CBC: Recent Labs  Lab 12/02/21 1551 12/04/21 0430 12/05/21 0525 12/06/21 0653  WBC 5.8 6.1 6.7 6.9  NEUTROABS 3.9  --   --   --   HGB 11.8* 10.9* 10.2* 10.0*  HCT 36.2 33.8* 32.2* 31.5*  MCV 89.4 88.9 91.7 91.3  PLT 262 263 234 227    Cardiac Enzymes: No results for input(s): CKTOTAL, CKMB, CKMBINDEX, TROPONINI in the last 168 hours. BNP: Invalid input(s): POCBNP CBG: Recent Labs  Lab 12/02/21 1525  GLUCAP 105*    D-Dimer No results for input(s): DDIMER in the last 72 hours. Hgb A1c No results for input(s): HGBA1C in the last 72 hours.  Lipid Profile No results for input(s): CHOL, HDL, LDLCALC, TRIG, CHOLHDL, LDLDIRECT in the last 72 hours.  Thyroid function studies No results for input(s): TSH, T4TOTAL, T3FREE, THYROIDAB in the last 72 hours.  Invalid input(s): FREET3  Anemia work up No results for input(s): VITAMINB12, FOLATE, FERRITIN, TIBC, IRON, RETICCTPCT in the last 72 hours.  Urinalysis    Component Value Date/Time   COLORURINE COLORLESS (A) 12/02/2021 1551   APPEARANCEUR CLEAR (A) 12/02/2021 1551   APPEARANCEUR Hazy 07/25/2014 1350   LABSPEC 1.019 12/02/2021 1551   LABSPEC 1.020 07/25/2014 1350  PHURINE 8.0 12/02/2021 1551   GLUCOSEU NEGATIVE 12/02/2021 1551   GLUCOSEU Negative 07/25/2014 1350   HGBUR SMALL (A) 12/02/2021 1551   BILIRUBINUR NEGATIVE 12/02/2021 1551   BILIRUBINUR Negative 07/25/2014 1350   KETONESUR 5 (A) 12/02/2021 1551    PROTEINUR 30 (A) 12/02/2021 1551   NITRITE NEGATIVE 12/02/2021 1551   LEUKOCYTESUR TRACE (A) 12/02/2021 1551   LEUKOCYTESUR 2+ 07/25/2014 1350   Sepsis Labs Invalid input(s): PROCALCITONIN,  WBC,  LACTICIDVEN Microbiology Recent Results (from the past 240 hour(s))  Culture, blood (Routine x 2)     Status: None (Preliminary result)   Collection Time: 12/02/21  3:50 PM   Specimen: BLOOD  Result Value Ref Range Status   Specimen Description BLOOD LAC  Final   Special Requests BOTTLES DRAWN AEROBIC AND ANAEROBIC BCAV  Final   Culture   Final    NO GROWTH 4 DAYS Performed at [facility], [address], [place], [place] [address]    Report Status PENDING  Incomplete  Resp Panel by RT-PCR (Flu A&B, Covid) Nasopharyngeal Swab     Status: None   Collection Time: 12/02/21  3:51 PM   Specimen: Nasopharyngeal Swab; Nasopharyngeal(NP) swabs in vial transport medium  Result Value Ref Range Status   SARS Coronavirus 2 by RT PCR NEGATIVE NEGATIVE Final    Comment: (NOTE) SARS-CoV-2 target nucleic acids are NOT DETECTED.  The SARS-CoV-2 RNA is generally detectable in upper respiratory specimens during the acute phase of infection. The lowest concentration of SARS-CoV-2 viral copies this assay can detect is 138 copies/mL. A negative result does not preclude SARS-Cov-2 infection and should not be used as the sole basis for treatment or other patient management decisions. A negative result may occur with  improper specimen collection/handling, submission of specimen other than nasopharyngeal swab, presence of viral mutation(s) within the areas targeted by this assay, and inadequate number of viral copies(<138 copies/mL). A negative result must be combined with clinical observations, patient history, and epidemiological information. The expected result is Negative.  Fact Sheet for Patients:  EntrepreneurPulse.com.au  Fact Sheet for Healthcare Providers:   IncredibleEmployment.be  This test is no t yet approved or cleared by the [place] FDA and  has been authorized for detection and/or diagnosis of SARS-CoV-2 by FDA under an Emergency Use Authorization (EUA). This EUA will remain  in effect (meaning this test can be used) for the duration of the COVID-19 declaration under Section 564(b)(1) of the Act, 21 U.S.C.section 360bbb-3(b)(1), unless the authorization is terminated  or revoked sooner.       Influenza A by PCR NEGATIVE NEGATIVE Final   Influenza B by PCR NEGATIVE NEGATIVE Final    Comment: (NOTE) The Xpert Xpress SARS-CoV-2/FLU/RSV plus assay is intended as an aid in the diagnosis of influenza from Nasopharyngeal swab specimens and should not be used as a sole basis for treatment. Nasal washings and aspirates are unacceptable for Xpert Xpress SARS-CoV-2/FLU/RSV testing.  Fact Sheet for Patients: EntrepreneurPulse.com.au  Fact Sheet for Healthcare Providers: IncredibleEmployment.be  This test is not yet approved or cleared by the [place] FDA and has been authorized for detection and/or diagnosis of SARS-CoV-2 by FDA under an Emergency Use Authorization (EUA). This EUA will remain in effect (meaning this test can be used) for the duration of the COVID-19 declaration under Section 564(b)(1) of the Act, 21 U.S.C. section 360bbb-3(b)(1), unless the authorization is terminated or revoked.  Performed at [facility], [address], [place],  [address]   Urine  Culture     Status: Abnormal   Collection Time: 12/02/21  3:51 PM   Specimen: Urine, Clean Catch  Result Value Ref Range Status   Specimen Description   Final    URINE, CLEAN CATCH Performed at [facility], [address], [place], [place] [address]    Special Requests   Final    NONE Performed at [facility], [address] [place], [place] [address]     Culture MULTIPLE SPECIES PRESENT, SUGGEST RECOLLECTION (A)  Final   Report Status 12/03/2021 FINAL  Final  Culture, blood (Routine x 2)     Status: None (Preliminary result)   Collection Time: 12/02/21  4:00 PM   Specimen: BLOOD  Result Value Ref Range Status   Specimen Description BLOOD [facility]  Final   Special Requests BOTTLES DRAWN AEROBIC AND ANAEROBIC BCAV  Final   Culture   Final    NO GROWTH 4 DAYS Performed at [facility], [address], [place], [place] [address]    Report Status PENDING  Incomplete     Time coordinating discharge: 40 minutes  SIGNED:   Elmarie Shiley, MD  [facility]

## 2021-12-06 NOTE — TOC Progression Note (Signed)
Transition of Care [facility]) - Progression Note    Patient Details  Name: Erin Sutton MRN: [id number] Date of Birth: 09-20-1941  Transition of Care [facility]) CM/SW [place], RN Phone Number: 12/06/2021, 11:02 AM  Clinical Narrative:  Patient discharging to [facility] today per Tammy, patient will go to room 705 via EMS.  Patient is second on EMS list.     Expected Discharge Plan: Skilled Nursing Facility Barriers to Discharge: Continued Medical Work up  Expected Discharge Plan and Services Expected Discharge Plan: [place]   Discharge Planning Services: CM Consult Post Acute Care Choice: [place] Living arrangements for the past 2 months: Mobile Home Expected Discharge Date: 12/06/21               DME Arranged: N/A DME Agency: NA       HH Arranged: NA HH Agency: NA         Social Determinants of Health (SDOH) Interventions    Readmission Risk Interventions No flowsheet data found.

## 2021-12-07 DIAGNOSIS — I482 Chronic atrial fibrillation, unspecified: Secondary | ICD-10-CM | POA: Diagnosis not present

## 2021-12-07 DIAGNOSIS — M6281 Muscle weakness (generalized): Secondary | ICD-10-CM | POA: Diagnosis not present

## 2021-12-07 DIAGNOSIS — I10 Essential (primary) hypertension: Secondary | ICD-10-CM | POA: Diagnosis not present

## 2021-12-07 LAB — CULTURE, BLOOD (ROUTINE X 2)
Culture: NO GROWTH
Culture: NO GROWTH

## 2021-12-08 ENCOUNTER — Emergency Department: Payer: Medicare HMO

## 2021-12-08 ENCOUNTER — Other Ambulatory Visit: Payer: Self-pay

## 2021-12-08 ENCOUNTER — Observation Stay
Admission: EM | Admit: 2021-12-08 | Discharge: 2021-12-10 | Disposition: A | Discharge status: Skilled Nursing Facility | Payer: Medicare HMO | Attending: Internal Medicine | Admitting: Internal Medicine

## 2021-12-08 DIAGNOSIS — Z86718 Personal history of other venous thrombosis and embolism: Secondary | ICD-10-CM

## 2021-12-08 DIAGNOSIS — I10 Essential (primary) hypertension: Secondary | ICD-10-CM | POA: Diagnosis not present

## 2021-12-08 DIAGNOSIS — I160 Hypertensive urgency: Secondary | ICD-10-CM | POA: Diagnosis not present

## 2021-12-08 DIAGNOSIS — Z86711 Personal history of pulmonary embolism: Secondary | ICD-10-CM

## 2021-12-08 DIAGNOSIS — R0689 Other abnormalities of breathing: Secondary | ICD-10-CM | POA: Diagnosis not present

## 2021-12-08 DIAGNOSIS — R001 Bradycardia, unspecified: Secondary | ICD-10-CM | POA: Diagnosis present

## 2021-12-08 DIAGNOSIS — R4781 Slurred speech: Secondary | ICD-10-CM | POA: Diagnosis not present

## 2021-12-08 DIAGNOSIS — E785 Hyperlipidemia, unspecified: Secondary | ICD-10-CM

## 2021-12-08 DIAGNOSIS — I480 Paroxysmal atrial fibrillation: Secondary | ICD-10-CM

## 2021-12-08 DIAGNOSIS — I82402 Acute embolism and thrombosis of unspecified deep veins of left lower extremity: Secondary | ICD-10-CM | POA: Diagnosis present

## 2021-12-08 DIAGNOSIS — I6930 Unspecified sequelae of cerebral infarction: Secondary | ICD-10-CM

## 2021-12-08 DIAGNOSIS — E213 Hyperparathyroidism, unspecified: Secondary | ICD-10-CM | POA: Diagnosis not present

## 2021-12-08 DIAGNOSIS — E039 Hypothyroidism, unspecified: Secondary | ICD-10-CM | POA: Diagnosis not present

## 2021-12-08 DIAGNOSIS — Z79899 Other long term (current) drug therapy: Secondary | ICD-10-CM | POA: Insufficient documentation

## 2021-12-08 DIAGNOSIS — R29704 NIHSS score 4: Secondary | ICD-10-CM | POA: Diagnosis not present

## 2021-12-08 DIAGNOSIS — Z7901 Long term (current) use of anticoagulants: Secondary | ICD-10-CM | POA: Diagnosis not present

## 2021-12-08 DIAGNOSIS — I82512 Chronic embolism and thrombosis of left femoral vein: Secondary | ICD-10-CM

## 2021-12-08 DIAGNOSIS — Z7982 Long term (current) use of aspirin: Secondary | ICD-10-CM | POA: Diagnosis not present

## 2021-12-08 DIAGNOSIS — R471 Dysarthria and anarthria: Secondary | ICD-10-CM | POA: Insufficient documentation

## 2021-12-08 DIAGNOSIS — R531 Weakness: Secondary | ICD-10-CM | POA: Diagnosis not present

## 2021-12-08 DIAGNOSIS — Z8673 Personal history of transient ischemic attack (TIA), and cerebral infarction without residual deficits: Secondary | ICD-10-CM | POA: Insufficient documentation

## 2021-12-08 DIAGNOSIS — R29810 Facial weakness: Secondary | ICD-10-CM

## 2021-12-08 DIAGNOSIS — F0390 Unspecified dementia without behavioral disturbance: Secondary | ICD-10-CM | POA: Diagnosis not present

## 2021-12-08 DIAGNOSIS — I2699 Other pulmonary embolism without acute cor pulmonale: Secondary | ICD-10-CM | POA: Diagnosis not present

## 2021-12-08 DIAGNOSIS — F958 Other tic disorders: Secondary | ICD-10-CM

## 2021-12-08 DIAGNOSIS — G459 Transient cerebral ischemic attack, unspecified: Principal | ICD-10-CM | POA: Insufficient documentation

## 2021-12-08 DIAGNOSIS — Z20822 Contact with and (suspected) exposure to covid-19: Secondary | ICD-10-CM | POA: Insufficient documentation

## 2021-12-08 HISTORY — DX: Hypertensive urgency: I16.0

## 2021-12-08 LAB — COMPREHENSIVE METABOLIC PANEL
ALT: 16 U/L (ref 0–44)
AST: 33 U/L (ref 15–41)
Albumin: 4.5 g/dL (ref 3.5–5.0)
Alkaline Phosphatase: 59 U/L (ref 38–126)
Anion gap: 9 (ref 5–15)
BUN: 13 mg/dL (ref 8–23)
CO2: 26 mmol/L (ref 22–32)
Calcium: 10 mg/dL (ref 8.9–10.3)
Chloride: 101 mmol/L (ref 98–111)
Creatinine, Ser: 0.82 mg/dL (ref 0.44–1.00)
GFR, Estimated: >60 mL/min (ref >60)
Glucose, Bld: 103 mg/dL — ABNORMAL HIGH (ref 70–99)
Potassium: 3.9 mmol/L (ref 3.5–5.1)
Sodium: 136 mmol/L (ref 135–145)
Total Bilirubin: 0.6 mg/dL (ref 0.3–1.2)
Total Protein: 8.2 g/dL — ABNORMAL HIGH (ref 6.5–8.1)

## 2021-12-08 LAB — DIFFERENTIAL
Abs Immature Granulocytes: 0.01 10*3/uL (ref 0.00–0.07)
Basophils Absolute: 0.1 10*3/uL (ref 0.0–0.1)
Basophils Relative: 1 %
Eosinophils Absolute: 0.1 10*3/uL (ref 0.0–0.5)
Eosinophils Relative: 2 %
Immature Granulocytes: 0 %
Lymphocytes Relative: 20 %
Lymphs Abs: 1.3 10*3/uL (ref 0.7–4.0)
Monocytes Absolute: 0.7 10*3/uL (ref 0.1–1.0)
Monocytes Relative: 11 %
Neutro Abs: 4.2 10*3/uL (ref 1.7–7.7)
Neutrophils Relative %: 66 %

## 2021-12-08 LAB — RESP PANEL BY RT-PCR (FLU A&B, COVID) ARPGX2
Influenza A by PCR: NEGATIVE
Influenza B by PCR: NEGATIVE
SARS Coronavirus 2 by RT PCR: NEGATIVE

## 2021-12-08 LAB — CBC
HCT: 39.5 % (ref 36.0–46.0)
Hemoglobin: 12.6 g/dL (ref 12.0–15.0)
MCH: 28.8 pg (ref 26.0–34.0)
MCHC: 31.9 g/dL (ref 30.0–36.0)
MCV: 90.4 fL (ref 80.0–100.0)
Platelets: 283 10*3/uL (ref 150–400)
RBC: 4.37 MIL/uL (ref 3.87–5.11)
RDW: 16 % — ABNORMAL HIGH (ref 11.5–15.5)
WBC: 6.4 10*3/uL (ref 4.0–10.5)
nRBC: 0 % (ref 0.0–0.2)

## 2021-12-08 LAB — APTT: aPTT: 37 seconds — ABNORMAL HIGH (ref 24–36)

## 2021-12-08 LAB — TROPONIN I (HIGH SENSITIVITY)
Troponin I (High Sensitivity): 6 ng/L (ref <18)
Troponin I (High Sensitivity): 5 ng/L (ref <18)

## 2021-12-08 LAB — PROTIME-INR
INR: 1.5 — ABNORMAL HIGH (ref 0.8–1.2)
Prothrombin Time: 17.7 seconds — ABNORMAL HIGH (ref 11.4–15.2)

## 2021-12-08 LAB — CBG MONITORING, ED: Glucose-Capillary: 98 mg/dL (ref 70–99)

## 2021-12-08 MED ORDER — ACETAMINOPHEN 650 MG RE SUPP: 650.0000 mg | RECTAL | Status: DC | PRN

## 2021-12-08 MED ORDER — HYDRALAZINE HCL 20 MG/ML IJ SOLN: 5.0000 mg | INTRAMUSCULAR | Status: DC | PRN

## 2021-12-08 MED ORDER — POLYVINYL ALCOHOL 1.4 % OP SOLN
1.0000 [drp] | Freq: Two times a day (BID) | OPHTHALMIC | Status: DC | PRN
  Filled 2021-12-08: qty 15

## 2021-12-08 MED ORDER — MELATONIN 3 MG PO TABS
3.0000 mg | ORAL_TABLET | Freq: Every evening | ORAL | Status: DC | PRN
  Filled 2021-12-08: qty 1

## 2021-12-08 MED ORDER — PANTOPRAZOLE SODIUM 40 MG PO TBEC
40.0000 mg | DELAYED_RELEASE_TABLET | Freq: Two times a day (BID) | ORAL | Status: DC
  Administered 2021-12-08 – 2021-12-10 (×4): 40 mg via ORAL
  Filled 2021-12-08 (×4): qty 1

## 2021-12-08 MED ORDER — SODIUM CHLORIDE 0.9% FLUSH
3.0000 mL | Freq: Once | INTRAVENOUS | Status: AC
  Administered 2021-12-08: 3 mL via INTRAVENOUS

## 2021-12-08 MED ORDER — SENNOSIDES-DOCUSATE SODIUM 8.6-50 MG PO TABS: 2.0000 | ORAL_TABLET | Freq: Two times a day (BID) | ORAL | Status: DC | PRN

## 2021-12-08 MED ORDER — TRAZODONE HCL 50 MG PO TABS
50.0000 mg | ORAL_TABLET | Freq: Every day | ORAL | Status: DC
  Administered 2021-12-08 – 2021-12-09 (×2): 50 mg via ORAL
  Filled 2021-12-08 (×2): qty 1

## 2021-12-08 MED ORDER — STROKE: EARLY STAGES OF RECOVERY BOOK: Freq: Once | Status: DC

## 2021-12-08 MED ORDER — ATROPINE SULFATE 1 MG/10ML IJ SOSY
1.0000 mg | PREFILLED_SYRINGE | INTRAMUSCULAR | Status: DC | PRN
  Administered 2021-12-08: 1 mg via INTRAVENOUS
  Filled 2021-12-08: qty 10

## 2021-12-08 MED ORDER — POLYETHYLENE GLYCOL 3350 17 GM/SCOOP PO POWD
17.0000 g | Freq: Every day | ORAL | Status: DC | PRN
  Filled 2021-12-08: qty 255

## 2021-12-08 MED ORDER — HYDROCODONE-ACETAMINOPHEN 5-325 MG PO TABS
1.0000 | ORAL_TABLET | Freq: Four times a day (QID) | ORAL | Status: DC | PRN
Start: 2021-12-08 — End: 2021-12-10
  Administered 2021-12-09: 1 via ORAL
  Filled 2021-12-08: qty 1

## 2021-12-08 MED ORDER — ASPIRIN 81 MG PO CHEW
81.0000 mg | CHEWABLE_TABLET | Freq: Every day | ORAL | Status: DC
  Administered 2021-12-08 – 2021-12-10 (×3): 81 mg via ORAL
  Filled 2021-12-08 (×3): qty 1

## 2021-12-08 MED ORDER — HYDRALAZINE HCL 20 MG/ML IJ SOLN
5.0000 mg | INTRAMUSCULAR | Status: DC | PRN
  Administered 2021-12-08: 5 mg via INTRAVENOUS
  Filled 2021-12-08: qty 1

## 2021-12-08 MED ORDER — ACETAMINOPHEN 160 MG/5ML PO SOLN
650.0000 mg | ORAL | Status: DC | PRN
  Filled 2021-12-08: qty 20.3

## 2021-12-08 MED ORDER — ACETAMINOPHEN 325 MG PO TABS
650.0000 mg | ORAL_TABLET | ORAL | Status: DC | PRN
  Administered 2021-12-08 – 2021-12-10 (×3): 650 mg via ORAL
  Filled 2021-12-08 (×3): qty 2

## 2021-12-08 MED ORDER — LEVOTHYROXINE SODIUM 50 MCG PO TABS
50.0000 ug | ORAL_TABLET | Freq: Every day | ORAL | Status: DC
  Administered 2021-12-09 – 2021-12-10 (×2): 50 ug via ORAL
  Filled 2021-12-08 (×2): qty 1

## 2021-12-08 MED ORDER — METHOCARBAMOL 500 MG PO TABS
500.0000 mg | ORAL_TABLET | Freq: Every day | ORAL | Status: DC
  Administered 2021-12-09: 500 mg via ORAL
  Filled 2021-12-08 (×3): qty 1

## 2021-12-08 MED ORDER — SODIUM CHLORIDE 0.9 % IV BOLUS
1000.0000 mL | Freq: Once | INTRAVENOUS | Status: AC
  Administered 2021-12-08: 1000 mL via INTRAVENOUS

## 2021-12-08 MED ORDER — ONDANSETRON HCL 4 MG/2ML IJ SOLN
4.0000 mg | Freq: Three times a day (TID) | INTRAMUSCULAR | Status: DC | PRN
  Administered 2021-12-08: 4 mg via INTRAVENOUS
  Filled 2021-12-08: qty 2

## 2021-12-08 MED ORDER — SODIUM CHLORIDE 0.9 % IV SOLN: INTRAVENOUS | Status: DC

## 2021-12-08 MED ORDER — APIXABAN 5 MG PO TABS
5.0000 mg | ORAL_TABLET | Freq: Two times a day (BID) | ORAL | Status: DC
  Administered 2021-12-08 – 2021-12-10 (×4): 5 mg via ORAL
  Filled 2021-12-08 (×4): qty 1

## 2021-12-08 MED ORDER — POLYETHYL GLYCOL-PROPYL GLYCOL 0.4-0.3 % OP GEL: Freq: Two times a day (BID) | OPHTHALMIC | Status: DC

## 2021-12-08 MED ORDER — SENNOSIDES-DOCUSATE SODIUM 8.6-50 MG PO TABS: 1.0000 | ORAL_TABLET | Freq: Every evening | ORAL | Status: DC | PRN

## 2021-12-08 MED ORDER — ATORVASTATIN CALCIUM 20 MG PO TABS
40.0000 mg | ORAL_TABLET | Freq: Every day | ORAL | Status: DC
  Administered 2021-12-08 – 2021-12-10 (×3): 40 mg via ORAL
  Filled 2021-12-08 (×3): qty 2

## 2021-12-08 NOTE — ED Notes (Signed)
Pt given meal tray.

## 2021-12-08 NOTE — ED Notes (Addendum)
Rn to bedside to answer call bell. Pt yelled "I am wet". RN informed her she has a purewick in place and might get a little damp if it isn't sitting just right but she has a brief on. Pt threw her covers off herself and yell at RN disgruntled "I know the routine. I am here all the time. And I am wet and you must change me". RN assessed brief then left bedside. Pt speech and movement completely better from when she came in as code stroke and was unable to speak clearly and move her arms.

## 2021-12-08 NOTE — ED Notes (Signed)
Pt asked to be ambulated in room and to toilet. Pt ambulated without assistance. Pt then placed back in bed with clean sheets and brief. Pt also apologized for being ugly to this RN earlier in the day.

## 2021-12-08 NOTE — ED Triage Notes (Signed)
BIB EMS from home. Pt complaint of headache and weakness and slurred speech. EMS familiar with patient. Vitals WNL for them.

## 2021-12-08 NOTE — ED Notes (Signed)
PT HR and BP dropped after BP meds given. MD made aware. Fluid bolus to be given.

## 2021-12-08 NOTE — H&P (Signed)
Sent  History and Physical    Erin Sutton MWN:[id number] DOB: 1941/07/03 DOA: 12/08/2021  Referring MD/NP/PA:   PCP: Baxter Hire, MD   Patient coming from:  The patient is coming from home.  At baseline, pt is independent for most of ADL.        Chief Complaint: Slurred speech, right facial droop, headache  HPI: Erin Sutton is a 81 y.o. 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  Patient was recently hospitalized from 1/9-1/13 due to elevated troponin which was thought due to elevated blood pressure.  Patient had weakness and had negative MRI for brain in hospital. Per her daughter, patient was discharged to rehab facility.  Per report, patient was last known normal at about 830, and was found to have slurred speech, right facial droop, complains of headache and generalized weakness.  Patient moves all extremities.  No unilateral numbness or tinglings in extremities.  Denies chest pain, cough, shortness breath.  No nausea vomiting, diarrhea or abdominal pain.  No symptoms of UTI.    Patient has elevated blood pressure up to 208/83. Pt initially has heart rate 55-70s. Per nurse report, patient was given 5 mg of IV hydralazine, her blood pressure dropped to 101/59, and had 1 episode of bradycardia with heart rate down 30, then come back to 40-50s.  Patient complains dizziness during this time.  ED Course: pt was found to have WBC 6.4, INR 1.5, PTT 37, troponin level 6, negative COVID PCR, GFR> 60, magnesium 2.3, RR 16, oxygen sat 95% on room air, temperature normal.  CT of head is negative for acute intracranial abnormalities.  Dr. Theda Sers of neurology is consulted   EKG: I have personally reviewed.  Sinus rhythm, QTC 479, bifascicular block, heart rate  67.  Review of Systems:   General: no fevers, chills, no body weight gain, has fatigue HEENT: no blurry vision, hearing changes or sore throat Respiratory: no dyspnea, coughing, wheezing CV: no chest pain, no palpitations GI: no nausea, vomiting, abdominal pain, diarrhea, constipation GU: no dysuria, burning on urination, increased urinary frequency, hematuria  Ext: no leg edema Neuro: no vision change or hearing loss.  Has slurred speech, right facial droop, headache, weakness Skin: no rash, no skin tear. MSK: No muscle spasm, no deformity, no limitation of range of movement in spin Heme: No easy bruising.  Travel history: No recent long distant travel.   Allergy:  Allergies  Allergen Reactions   Ace Inhibitors Cough   Levofloxacin    Penicillin G Other (See Comments)   Tape Other (See Comments)    Other reaction(s): Other (See Comments)   Amoxicillin Rash    Has patient had a PCN reaction causing immediate rash, facial/tongue/throat swelling, SOB or lightheadedness with hypotension: No Has patient had a PCN reaction causing severe rash involving mucus membranes or skin necrosis: No Has patient had a PCN reaction that required hospitalization: No Has patient had a PCN reaction occurring within the last 10 years: No If all of the above answers are "NO", then may proceed with Cephalosporin use.    Benicar [Olmesartan] Rash    Itching rash   Codeine Rash   Codeine Sulfate Rash   Levaquin [Levofloxacin In D5w] Rash    Alters mental status   Penicillin V Potassium Rash    Has patient had a PCN  reaction causing immediate rash, facial/tongue/throat swelling, SOB or lightheadedness with hypotension: No Has patient had a PCN reaction causing severe rash involving mucus membranes or skin necrosis: No Has patient had a PCN reaction that required hospitalization: No Has patient had a PCN reaction occurring within the last 10 years: No If all of the above answers are "NO", then may  proceed with Cephalosporin use.    Sulfa Antibiotics Rash    Past Medical History:  Diagnosis Date   Anxiety    Arthritis    Chronic kidney disease    Constipation    GERD (gastroesophageal reflux disease)    History of DVT (deep vein thrombosis) 07/27/2019   History of pulmonary embolism 07/14/2019   Bilateral   Hypertension    Hypothyroidism    Osteoporosis    Sleep apnea    Urinary incontinence     Past Surgical History:  Procedure Laterality Date   BREAST BIOPSY Right    neg/stereo   BREAST BIOPSY Left    neg/stereo   BREAST EXCISIONAL BIOPSY Right    neg   CATARACT EXTRACTION, BILATERAL     COLONOSCOPY WITH PROPOFOL N/A 07/02/2015   Procedure: COLONOSCOPY WITH PROPOFOL;  Surgeon: Manya Silvas, MD;  Location: [facility] ENDOSCOPY;  Service: Endoscopy;  Laterality: N/A;   CORRECTION HAMMER TOE Right    ESOPHAGOGASTRODUODENOSCOPY (EGD) WITH PROPOFOL N/A 07/02/2015   Procedure: ESOPHAGOGASTRODUODENOSCOPY (EGD) WITH PROPOFOL;  Surgeon: Manya Silvas, MD;  Location: [facility] ENDOSCOPY;  Service: Endoscopy;  Laterality: N/A;   JOINT REPLACEMENT Left    knee   REPLACEMENT TOTAL KNEE Left    SKIN BIOPSY Right     Social History:  reports that she has never smoked. She has never used smokeless tobacco. She reports that she does not drink alcohol and does not use drugs.  Family History:  Family History  Problem Relation Age of Onset   Hypertension Mother    Diabetes Mother    Heart attack Mother    Asthma Mother    Peripheral vascular disease Mother    Heart attack Father    Hypertension Father    Diabetes Father    Colon cancer Maternal Aunt    Breast cancer Cousin      Prior to Admission medications   Medication Sig Start Date End Date Taking? Authorizing Provider  methocarbamol (ROBAXIN) 500 MG tablet Take 500 mg by mouth at bedtime.   Yes [provider]  spironolactone (ALDACTONE) 25 MG tablet Take 25 mg by mouth daily.   Yes [provider]   traZODone (DESYREL) 50 MG tablet Take 50 mg by mouth at bedtime.   Yes [provider]  amLODipine (NORVASC) 2.5 MG tablet Take 1 tablet (2.5 mg total) by mouth daily as needed (for SBP>180). 11/20/21 11/20/22  Merlyn Lot, MD  apixaban (ELIQUIS) 5 MG TABS tablet Take 1 tablet (5 mg total) by mouth 2 (two) times daily. 11/14/21   Barb Merino, MD  aspirin 81 MG chewable tablet Chew 1 tablet (81 mg total) by mouth daily. 12/06/21   Regalado, Belkys A, MD  atorvastatin (LIPITOR) 40 MG tablet Take 40 mg by mouth daily.    [provider]  diclofenac Sodium (VOLTAREN) 1 % GEL Apply 1 application topically daily. Patient not taking: Reported on 12/02/2021 11/08/21   [provider]  HYDROcodone-acetaminophen (NORCO/VICODIN) 5-325 MG tablet Take 1 tablet by mouth every 4 (four) hours. 11/26/21   [provider]  levothyroxine (SYNTHROID, LEVOTHROID) 50 MCG tablet Take  50 mcg by mouth daily before breakfast.    [provider]  melatonin 3 MG TABS tablet Take 1 tablet by mouth at bedtime as needed. 11/08/21   [provider]  metoprolol tartrate (LOPRESSOR) 25 MG tablet Take 1 tablet (25 mg total) by mouth in the morning and at bedtime. 11/14/21 02/12/22  Barb Merino, MD  pantoprazole (PROTONIX) 40 MG tablet Take 1 tablet (40 mg total) by mouth 2 (two) times daily. 12/05/21   Regalado, Belkys A, MD  Polyethyl Glycol-Propyl Glycol (SYSTANE OP) Place 1 drop into both eyes 2 (two) times daily.    [provider]  polyethylene glycol powder (GLYCOLAX/MIRALAX) 17 GM/SCOOP powder Take 17 g by mouth daily. 08/03/20   [provider]  senna-docusate (SENOKOT-S) 8.6-50 MG tablet Take 2 tablets by mouth 2 (two) times daily as needed for mild constipation. 04/15/20   Sharen Hones, MD  thiamine 100 MG tablet Take 1 tablet by mouth daily. Patient not taking: Reported on 12/02/2021 11/08/21   [provider]    Physical Exam: Vitals:    12/08/21 1238 12/08/21 1255 12/08/21 1300 12/08/21 1330  BP: (!) 101/59  (!) 67/44 110/66  Pulse: 65 65 68 99  Resp: (!) 21 19 18 15   Temp:      TempSrc:      SpO2: 100% 99% 97% 96%  Weight:      Height:       General: Not in acute distress HEENT:       Eyes: PERRL, EOMI, no scleral icterus.       ENT: No discharge from the ears and nose       Neck: No JVD, no bruit, no mass felt. Heme: No neck lymph node enlargement. Cardiac: S1/S2, RRR, No murmurs, No gallops or rubs. Respiratory: No rales, wheezing, rhonchi or rubs. GI: Soft, nondistended, nontender, no rebound pain, no organomegaly, BS present. GU: No hematuria Ext: No pitting leg edema bilaterally. 1+DP/PT pulse bilaterally. Musculoskeletal: No joint deformities, No joint redness or warmth, no limitation of ROM in spin. Skin: No rashes.  Neuro: Alert, oriented X3, cranial nerves II-XII grossly intact except for mild right facial droop, Patient has generalized weakness with muscle strength 4/5 in all extremities, sensation to light touch intact.  Psych: Patient is not psychotic, no suicidal or hemocidal ideation.  Labs on Admission: I have personally reviewed following labs and imaging studies  CBC: Recent Labs  Lab 12/02/21 1551 12/04/21 0430 12/05/21 0525 12/06/21 0653 12/08/21 1003  WBC 5.8 6.1 6.7 6.9 6.4  NEUTROABS 3.9  --   --   --  4.2  HGB 11.8* 10.9* 10.2* 10.0* 12.6  HCT 36.2 33.8* 32.2* 31.5* 39.5  MCV 89.4 88.9 91.7 91.3 90.4  PLT 262 263 234 227 332   Basic Metabolic Panel: Recent Labs  Lab 12/02/21 1551 12/04/21 0430 12/05/21 0525 12/06/21 0653 12/08/21 1003  NA 132* 137 139 135 136  K 4.0 4.1 4.2 4.1 3.9  CL 98 103 106 104 101  CO2 25 25 26 24 26   GLUCOSE 102* 103* 97 89 103*  BUN 11 24* 24* 19 13  CREATININE 0.77 0.99 1.01* 0.82 0.82  CALCIUM 9.7 9.4 8.9 8.9 10.0  MG  --  1.9 1.8 2.3  --    GFR: Estimated Creatinine Clearance: 49.8 mL/min (by C-G formula based on SCr of 0.82  mg/dL). Liver Function Tests: Recent Labs  Lab 12/02/21 1551 12/04/21 0431 12/08/21 1003  AST 18 18 33  ALT  10 9 16   ALKPHOS 48 39 59  BILITOT 0.6 0.6 0.6  PROT 6.9 6.1* 8.2*  ALBUMIN 3.8 3.6 4.5   Recent Labs  Lab 12/04/21 0430  LIPASE 40   No results for input(s): AMMONIA in the last 168 hours. Coagulation Profile: Recent Labs  Lab 12/02/21 1551 12/08/21 1003  INR 1.5* 1.5*   Cardiac Enzymes: No results for input(s): CKTOTAL, CKMB, CKMBINDEX, TROPONINI in the last 168 hours. BNP (last 3 results) No results for input(s): PROBNP in the last 8760 hours. HbA1C: No results for input(s): HGBA1C in the last 72 hours. CBG: Recent Labs  Lab 12/02/21 1525  GLUCAP 105*   Lipid Profile: No results for input(s): CHOL, HDL, LDLCALC, TRIG, CHOLHDL, LDLDIRECT in the last 72 hours. Thyroid Function Tests: No results for input(s): TSH, T4TOTAL, FREET4, T3FREE, THYROIDAB in the last 72 hours. Anemia Panel: No results for input(s): VITAMINB12, FOLATE, FERRITIN, TIBC, IRON, RETICCTPCT in the last 72 hours. Urine analysis:    Component Value Date/Time   COLORURINE COLORLESS (A) 12/02/2021 1551   APPEARANCEUR CLEAR (A) 12/02/2021 1551   APPEARANCEUR Hazy 07/25/2014 1350   LABSPEC 1.019 12/02/2021 1551   LABSPEC 1.020 07/25/2014 1350   PHURINE 8.0 12/02/2021 1551   GLUCOSEU NEGATIVE 12/02/2021 1551   GLUCOSEU Negative 07/25/2014 1350   HGBUR SMALL (A) 12/02/2021 1551   BILIRUBINUR NEGATIVE 12/02/2021 1551   BILIRUBINUR Negative 07/25/2014 1350   KETONESUR 5 (A) 12/02/2021 1551   PROTEINUR 30 (A) 12/02/2021 1551   NITRITE NEGATIVE 12/02/2021 1551   LEUKOCYTESUR TRACE (A) 12/02/2021 1551   LEUKOCYTESUR 2+ 07/25/2014 1350   Sepsis Labs: @LABRCNTIP (procalcitonin:4,lacticidven:4) ) Recent Results (from the past 240 hour(s))  Culture, blood (Routine x 2)     Status: None   Collection Time: 12/02/21  3:50 PM   Specimen: BLOOD  Result Value Ref Range Status   Specimen  Description BLOOD LAC  Final   Special Requests BOTTLES DRAWN AEROBIC AND ANAEROBIC BCAV  Final   Culture   Final    NO GROWTH 5 DAYS Performed at [facility], [address] [place], [place] [address]    Report Status 12/07/2021 FINAL  Final  Resp Panel by RT-PCR (Flu A&B, Covid) Nasopharyngeal Swab     Status: None   Collection Time: 12/02/21  3:51 PM   Specimen: Nasopharyngeal Swab; Nasopharyngeal(NP) swabs in vial transport medium  Result Value Ref Range Status   SARS Coronavirus 2 by RT PCR NEGATIVE NEGATIVE Final    Comment: (NOTE) SARS-CoV-2 target nucleic acids are NOT DETECTED.  The SARS-CoV-2 RNA is generally detectable in upper respiratory specimens during the acute phase of infection. The lowest concentration of SARS-CoV-2 viral copies this assay can detect is 138 copies/mL. A negative result does not preclude SARS-Cov-2 infection and should not be used as the sole basis for treatment or other patient management decisions. A negative result may occur with  improper specimen collection/handling, submission of specimen other than nasopharyngeal swab, presence of viral mutation(s) within the areas targeted by this assay, and inadequate number of viral copies(<138 copies/mL). A negative result must be combined with clinical observations, patient history, and epidemiological information. The expected result is Negative.  Fact Sheet for Patients:  EntrepreneurPulse.com.au  Fact Sheet for Healthcare Providers:  IncredibleEmployment.be  This test is no t yet approved or cleared by the [place] FDA and  has been authorized for detection and/or diagnosis of SARS-CoV-2 by FDA under an Emergency Use Authorization (EUA). This EUA will remain  in effect (meaning this test can be used) for the duration of the COVID-19 declaration under Section 564(b)(1) of the Act, 21 U.S.C.section 360bbb-3(b)(1), unless the authorization is  terminated  or revoked sooner.       Influenza A by PCR NEGATIVE NEGATIVE Final   Influenza B by PCR NEGATIVE NEGATIVE Final    Comment: (NOTE) The Xpert Xpress SARS-CoV-2/FLU/RSV plus assay is intended as an aid in the diagnosis of influenza from Nasopharyngeal swab specimens and should not be used as a sole basis for treatment. Nasal washings and aspirates are unacceptable for Xpert Xpress SARS-CoV-2/FLU/RSV testing.  Fact Sheet for Patients: EntrepreneurPulse.com.au  Fact Sheet for Healthcare Providers: IncredibleEmployment.be  This test is not yet approved or cleared by the [place] FDA and has been authorized for detection and/or diagnosis of SARS-CoV-2 by FDA under an Emergency Use Authorization (EUA). This EUA will remain in effect (meaning this test can be used) for the duration of the COVID-19 declaration under Section 564(b)(1) of the Act, 21 U.S.C. section 360bbb-3(b)(1), unless the authorization is terminated or revoked.  Performed at [facility], [address], [place], [place] [address]   Urine Culture     Status: Abnormal   Collection Time: 12/02/21  3:51 PM   Specimen: Urine, Clean Catch  Result Value Ref Range Status   Specimen Description   Final    URINE, CLEAN CATCH Performed at [facility], [address], [place], [place] [address]    Special Requests   Final    NONE Performed at [facility], [address] [place], [place] [address]    Culture MULTIPLE SPECIES PRESENT, SUGGEST RECOLLECTION (A)  Final   Report Status 12/03/2021 FINAL  Final  Culture, blood (Routine x 2)     Status: None   Collection Time: 12/02/21  4:00 PM   Specimen: BLOOD  Result Value Ref Range Status   Specimen Description BLOOD [facility]  Final   Special Requests BOTTLES DRAWN AEROBIC AND ANAEROBIC BCAV  Final   Culture   Final    NO GROWTH 5 DAYS Performed at [facility], [address] [place], [place] [address]    Report Status 12/07/2021 FINAL  Final  Resp Panel by RT-PCR (Flu A&B, Covid) Nasopharyngeal Swab     Status: None   Collection Time: 12/08/21 11:12 AM   Specimen: Nasopharyngeal Swab; Nasopharyngeal(NP) swabs in vial transport medium  Result Value Ref Range Status   SARS Coronavirus 2 by RT PCR NEGATIVE NEGATIVE Final    Comment: (NOTE) SARS-CoV-2 target nucleic acids are NOT DETECTED.  The SARS-CoV-2 RNA is generally detectable in upper respiratory specimens during the acute phase of infection. The lowest concentration of SARS-CoV-2 viral copies this assay can detect is 138 copies/mL. A negative result does not preclude SARS-Cov-2 infection and should not be used as the sole basis for treatment or other patient management decisions. A negative result may occur with  improper specimen collection/handling, submission of specimen other than nasopharyngeal swab, presence of viral mutation(s) within the areas targeted by this assay, and inadequate number of viral copies(<138 copies/mL). A negative result must be combined with clinical observations, patient history, and epidemiological information. The expected result is Negative.  Fact Sheet for Patients:  EntrepreneurPulse.com.au  Fact Sheet for Healthcare Providers:  IncredibleEmployment.be  This test is no t yet approved or cleared by the [place] FDA and  has been authorized for detection and/or diagnosis of SARS-CoV-2 by FDA under an Emergency Use  Authorization (EUA). This EUA will remain  in effect (meaning this test can be used) for the duration of the COVID-19 declaration under Section 564(b)(1) of the Act, 21 U.S.C.section 360bbb-3(b)(1), unless the authorization is terminated  or revoked sooner.       Influenza A by PCR NEGATIVE NEGATIVE Final   Influenza B by PCR NEGATIVE NEGATIVE Final    Comment: (NOTE) The Xpert Xpress SARS-CoV-2/FLU/RSV plus  assay is intended as an aid in the diagnosis of influenza from Nasopharyngeal swab specimens and should not be used as a sole basis for treatment. Nasal washings and aspirates are unacceptable for Xpert Xpress SARS-CoV-2/FLU/RSV testing.  Fact Sheet for Patients: EntrepreneurPulse.com.au  Fact Sheet for Healthcare Providers: IncredibleEmployment.be  This test is not yet approved or cleared by the [place] FDA and has been authorized for detection and/or diagnosis of SARS-CoV-2 by FDA under an Emergency Use Authorization (EUA). This EUA will remain in effect (meaning this test can be used) for the duration of the COVID-19 declaration under Section 564(b)(1) of the Act, 21 U.S.C. section 360bbb-3(b)(1), unless the authorization is terminated or revoked.  Performed at [facility], [address] [place], [place] [address]      Radiological Exams on Admission: CT HEAD CODE STROKE WO CONTRAST  Result Date: 12/08/2021 CLINICAL DATA:  Code stroke.  Facial droop with slurred speech EXAM: CT HEAD WITHOUT CONTRAST TECHNIQUE: Contiguous axial images were obtained from the base of the skull through the vertex without intravenous contrast. RADIATION DOSE REDUCTION: This exam was performed according to the departmental dose-optimization program which includes automated exposure control, adjustment of the mA and/or kV according to patient size and/or use of iterative reconstruction technique. COMPARISON:  Brain MRI from 6 days ago FINDINGS: Brain: No evidence of acute infarction, hemorrhage, hydrocephalus, extra-axial collection or mass lesion/mass effect. 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 Vascular: No hyperdense vessel or unexpected calcification. Skull: Normal. Negative for fracture or focal lesion. Sinuses/Orbits: No acute finding. Other: These results were communicated to Dr. Theda Sers at 10:00 am on 12/08/2021 by text page  via the [facility] messaging system. ASPECTS [facility] Stroke Program Early CT Score) - Ganglionic level infarction (caudate, lentiform nuclei, internal capsule, insula, M1-M3 cortex): 7 - Supraganglionic infarction (M4-M6 cortex): 3 Total score (0-10 with 10 being normal): 10 IMPRESSION: 1. No acute or interval finding. 2. ASPECTS is 10 Electronically Signed   By: Jorje Guild M.D.   On: 12/08/2021 10:00      Assessment/Plan Principal Problem:   TIA (transient ischemic attack) Active Problems:   Hypothyroidism   HTN (hypertension)   Bilateral pulmonary embolism (HCC)   Hyperlipidemia   Left leg DVT (HCC)   Hyperparathyroidism (HCC)   Paroxysmal atrial fibrillation (HCC)   Hypertensive urgency   Bradycardia   TIA (transient ischemic attack): pt has hx of TIA, now presents with slurred speech, right facial droop and headache.  No unilateral numbness or tinglings in extremities.  Patient just had negative MRI of brain on 1/9.  Her 2D echo on 12/03/2021 showed EF 60 to 65%. I discussed with patient for doing MRI of the brain again, she refused.  Etiology is not clear.  May be due to TIA. Dr. Theda Sers of neurology is consulted.  She had A1c 5.3 and LDL 68 on 12/03/21  -Placed on MedSurg bed for observation -Continue Eliquis due to A fib -Lipitor and aspirin 81 mg daily  swallowing screen. If fails, will get SLP - PT/OT consult -Frequent  neuro check  Hypothyroidism -Synthroid  HTN (hypertension) and Hypertensive urgency: Initially patient had elevated blood pressure 208/83, but after giving 5 mg of hydralazine, her blood pressure dropped to 101/59. -Will hold spironolactone, amlodipine, metoprolol -PRN IV hydralazine for SBP > 175 and DBP > 120 -Give 1 L normal saline bolus, then 75 cc/h  Bilateral pulmonary embolism (HCC) and left leg DVT: -Eliquis  Hyperlipidemia -Lipitor  Hyperparathyroidism ([address]): Calcium normal 10.0 -Follow-up BMP  Paroxysmal atrial fibrillation  ([place]): -Continue Eliquis -Hold metoprolol due to bradycardia  Bradycardia: Heart rate is down to 30s, then came back to 40-50s.  After giving 1 mg of atropine, heart rate is in 90s. -Hold metoprolol -As needed atropine for symptomatic bradycardia -Cardiac monitoring     DVT ppx: on Eliquis Code Status: Full code (I discussed with patient about CODE STATUS, and explained the meaning of CODE STATUS, she wants to be full code.  Daughter is aware this decision, daughter would like to discuss with patient further about her will). Family Communication:  Yes, patient's daughtee by phone Disposition Plan:  Anticipate discharge back to previous environment Consults called: Dr. Theda Sers Admission status and Level of care: Progressive:   for obs   Status is: Observation  The patient remains OBS appropriate and will d/c before 2 midnights.         Date of Service 12/08/2021    [facility]   If 7PM-7AM, please contact night-coverage [email or web address] 12/08/2021, 1:35 PM

## 2021-12-08 NOTE — Progress Notes (Signed)
PT Cancellation Note  Patient Details Name: Erin Sutton MRN: [id number] DOB: Mar 26, 1941   Cancelled Treatment:    Reason Eval/Treat Not Completed: Other (comment). PT orders received and pt chart reviewed. RN initially deferring evaluation due to low BP/HR and agreeable for PT to come back after improved BP and bolus (@ 1308). Upon PT return (@ 11) RN deferring as pt is presenting with whole body spasmodic jerking in supine and reports that pt is unsafe to be OOB at this time. PT to enter room and talk to pt, to let her know that PT will be back tomorrow. Pt presented with dysarthric speech, L sided facial droop (despite chart documenting R sided facial droop), R sided lean, and spasmodic jerking. Seems that pt is demonstrating inconsistent clinical presentation due to current presentation and documented notes from hospital staff. Left in care of MD at bedside.   Will follow up with PT evaluation tomorrow as appropriate.    Herminio Commons, PT, DPT 2:24 PM,12/08/21

## 2021-12-08 NOTE — ED Notes (Signed)
Per MD - hold all BP medications due to pts adverse reaction to hydralazine earlier today.

## 2021-12-08 NOTE — Consult Note (Addendum)
Neurology stroke Consult H&P  Erin Sutton MR# [id number] 12/08/2021   CC: headache and slurred speech  History is obtained from: EMS, patient and chart.  HPI: Erin Sutton is a 81 y.o. female living at facility PMHx as reviewed below, bilateral PEs and LLE DVT (apixaban), generalized weakness who known to neurology and recently discharged 12/06/2021 was last seen normal ~0830 and around 0915 she was noted to be complaining of headache, weakness and slurred speech and EMS called.  EMS reported BP 240/110 at scene.   LKW: 0830 tNK given: No not candidate IR Thrombectomy No, not indicated Modified Rankin Scale: 0-Completely asymptomatic and back to baseline post- stroke  LOC Responsiveness 0 LOC Questions 0 LOC Commands 0 Horizontal eye movement 0 Visual field 0 Facial palsy 1 Motor arm - Right arm 0 Motor arm - Left arm 0 Motor leg - Right leg 1 Motor leg - Left leg 1 Limb ataxia 0 Sensory test 0 Language 0 Speech 1 NIHSS: 4 - Poor effort and needed constant redirection.  ROS: A complete ROS was performed and is negative except as noted in the HPI.  Past Medical History:  Diagnosis Date   Anxiety    Arthritis    Chronic kidney disease    Constipation    GERD (gastroesophageal reflux disease)    History of DVT (deep vein thrombosis) 07/27/2019   History of pulmonary embolism 07/14/2019   Bilateral   Hypertension    Hypothyroidism    Osteoporosis    Sleep apnea    Urinary incontinence    Family History  Problem Relation Age of Onset   Hypertension Mother    Diabetes Mother    Heart attack Mother    Asthma Mother    Peripheral vascular disease Mother    Heart attack Father    Hypertension Father    Diabetes Father    Colon cancer Maternal Aunt    Breast cancer Cousin     Social History:  reports that she has never smoked. She has never used smokeless tobacco. She reports that she does not drink alcohol and does not use drugs.   Prior to Admission  medications   Medication Sig Start Date End Date Taking? Authorizing Provider  amLODipine (NORVASC) 2.5 MG tablet Take 1 tablet (2.5 mg total) by mouth daily as needed (for SBP>180). 11/20/21 11/20/22  Merlyn Lot, MD  apixaban (ELIQUIS) 5 MG TABS tablet Take 1 tablet (5 mg total) by mouth 2 (two) times daily. 11/14/21   Barb Merino, MD  aspirin 81 MG chewable tablet Chew 1 tablet (81 mg total) by mouth daily. 12/06/21   Regalado, Belkys A, MD  atorvastatin (LIPITOR) 40 MG tablet Take 40 mg by mouth daily.    [provider]  diclofenac Sodium (VOLTAREN) 1 % GEL Apply 1 application topically daily. Patient not taking: Reported on 12/02/2021 11/08/21   [provider]  HYDROcodone-acetaminophen (NORCO/VICODIN) 5-325 MG tablet Take 1 tablet by mouth every 4 (four) hours. 11/26/21   [provider]  levothyroxine (SYNTHROID, LEVOTHROID) 50 MCG tablet Take 50 mcg by mouth daily before breakfast.    [provider]  melatonin 3 MG TABS tablet Take 1 tablet by mouth at bedtime as needed. 11/08/21   [provider]  metoprolol tartrate (LOPRESSOR) 25 MG tablet Take 1 tablet (25 mg total) by mouth in the morning and at bedtime. 11/14/21 02/12/22  Barb Merino, MD  pantoprazole (PROTONIX) 40 MG tablet Take 1 tablet (40 mg total) by  mouth 2 (two) times daily. 12/05/21   Regalado, Belkys A, MD  Polyethyl Glycol-Propyl Glycol (SYSTANE OP) Place 1 drop into both eyes 2 (two) times daily.    [provider]  polyethylene glycol powder (GLYCOLAX/MIRALAX) 17 GM/SCOOP powder Take 17 g by mouth daily. 08/03/20   [provider]  senna-docusate (SENOKOT-S) 8.6-50 MG tablet Take 2 tablets by mouth 2 (two) times daily as needed for mild constipation. 04/15/20   Sharen Hones, MD  thiamine 100 MG tablet Take 1 tablet by mouth daily. Patient not taking: Reported on 12/02/2021 11/08/21   [provider]   Exam: Current vital signs: There were no  vitals taken for this visit.  Physical Exam  Constitutional: Appears well-developed and well-nourished.  Psych: Affect flat. Eyes: No scleral injection HENT: No OP obstruction. Head: Normocephalic.  Cardiovascular: Normal rate and regular rhythm.  Respiratory: Effort normal, symmetric excursions bilaterally, no audible wheezing. GI: Soft.  No distension. There is no tenderness.  Skin: WDI  Neuro: Mental Status: Patient is awake, alert, oriented to person, place, year, and situation. Patient is able to give a clear and coherent history. Speech fluctuating but overal fluent, intact comprehension and repetition. No signs of aphasia or neglect. Visual Fields are full. Pupils are equal, round, and reactive to light. EOMI without ptosis or diploplia.  Facial sensation is symmetric to temperature Facial movement is fluctuating asymmetric on left however this looks to habitual.  Hearing is intact to voice. Uvula midline and palate elevates symmetrically. Shoulder shrug is symmetric. Tongue is midline without atrophy or fasciculations.  Tone is normal. Bulk is normal. Diffusely weak ~4/5 in all four extremities. Sensation is decreased to temperature in left arm. Deep Tendon Reflexes: 2+ and symmetric in the biceps and patellae. Toes are downgoing bilaterally. FNF and HKS are intact bilaterally with encouragement. Gait - Deferred  I have reviewed labs in epic and the pertinent results are: RDW 16  I have reviewed the images obtained: NCT head showed no acute infarction, hemorrhage, mass lesion/mass effect. ASPECTS is 10. There were no interval changes as compared to MRI 12/02/2021.   Assessment: Erin Sutton is a 81 y.o. female PMHx as noted above, bilateral PE and LLE DVT (apixaban not a candidate for tNK), baseline generalized weakness with headache, slowness and generalized weakness. Exam was very effort dependent and fluctuating needing constant redirection. Her left face droop  fluctuated between symmetric and asymmetric at rest and symmetric with smile, and is likely habitual.   Her exam is unchanged as compared to my exam 12/03/2021.  She recently had negative stroke workup and will not need to be repeated.  Impression Hypertensive emergency. On apixaban NIHSS 4 - Poor effort. HTN Chronic left thalamic stroke Generalized weakness. Remote history of b/l PE and LLE DVT   Please cancel code stroke.   Plan: - Continue apixaban. - Gradually reduce MAP: ~10-20% the first hour and by 5-15% over the next 23 hours.  Target blood pressure of <180/<149mmHg for the first hour. Then <160/<121mmHg for the next 23 hours. - Telemetry monitoring. - Neurology will remain available, please call for questions.    This patient is critically ill and at significant risk of neurological worsening, death and care requires constant monitoring of vital signs, hemodynamics,respiratory and cardiac monitoring, neurological assessment, discussion with family, other specialists and medical decision making of high complexity. I spent 71 minutes of neurocritical care time  in the care of  this patient. This was time spent independent of any  time provided by nurse practitioner or PA.  Electronically signed by:  Lynnae Sandhoff, MD Page: [phone] 12/08/2021, 9:38 AM  If 7pm- 7am, please page neurology on call as listed in [place].

## 2021-12-08 NOTE — Progress Notes (Signed)
Assessed patient and Nurse made aware patient is at baseline. Attempted to call daughter [name] [phone] who lives in [place] but no answer.   Electronically signed by:  Lynnae Sandhoff, MD Page: [phone] 12/08/2021, 2:28 PM

## 2021-12-08 NOTE — ED Provider Notes (Signed)
[facility] Provider Note    Event Date/Time   First MD Initiated Contact with Patient 12/08/21 [id number]     (approximate)   History   Code Stroke   HPI  Erin Sutton is a 81 y.o. female with PMH of TIA, atrial fibrillation on Eliquis, hypertension, hypothyroidism, and dementia who presents with acute onset of right facial droop and slurred speech.  The patient was last seen at her baseline around 8:30 AM, and then developed symptoms sometime between then and around 9:15.  She states she has a headache and atraumatic right knee pain but denies any other acute complaints.   Physical Exam   Triage Vital Signs: ED Triage Vitals  Enc Vitals Group     BP 12/08/21 1019 (!) 157/109     Pulse Rate 12/08/21 1002 66     Resp 12/08/21 1002 16     Temp 12/08/21 1002 98.7 F (37.1 C)     Temp Source 12/08/21 1002 Oral     SpO2 12/08/21 1002 99 %     Weight 12/08/21 1004 143 lb 15.4 oz (65.3 kg)     Height 12/08/21 1004 5' 3"  (1.6 m)     Head Circumference --      Peak Flow --      Pain Score 12/08/21 1002 5     Pain Loc --      Pain Edu? --      Excl. in [place]? --     Most recent vital signs: Vitals:   12/08/21 1030 12/08/21 1100  BP: (!) 208/83 (!) 153/54  Pulse: 67 (!) 55  Resp: 16 15  Temp:    SpO2: 100% 95%     General: Awake, no distress.  CV:  Good peripheral perfusion.  Resp:  Normal effort.  Abd:  No distention.  Other:  Right facial droop.  Slurred speech.  Bilateral upper and lower extremity 4/5 motor strength.   ED Results / Procedures / Treatments   Labs (all labs ordered are listed, but only abnormal results are displayed) Labs Reviewed  PROTIME-INR - Abnormal; Notable for the following components:      Result Value   Prothrombin Time 17.7 (*)    INR 1.5 (*)    All other components within normal limits  APTT - Abnormal; Notable for the following components:   aPTT 37 (*)    All other components within normal limits  CBC -  Abnormal; Notable for the following components:   RDW 16.0 (*)    All other components within normal limits  COMPREHENSIVE METABOLIC PANEL - Abnormal; Notable for the following components:   Glucose, Bld 103 (*)    Total Protein 8.2 (*)    All other components within normal limits  RESP PANEL BY RT-PCR (FLU A&B, COVID) ARPGX2  DIFFERENTIAL  CBG MONITORING, ED  TROPONIN I (HIGH SENSITIVITY)     EKG  ED ECG REPORT I, Arta Silence, the attending physician, personally viewed and interpreted this ECG.  Date: 12/08/2021 EKG Time: 1006 Rate: 69 Rhythm: normal sinus rhythm QRS Axis: normal Intervals: RBBB ST/T Wave abnormalities: normal Narrative Interpretation: no evidence of acute ischemia    RADIOLOGY  CT head: I reviewed the images and do not see evidence of acute hemorrhage.  I reviewed the radiology report; there is no evidence of acute stroke or other acute abnormality.  IMPRESSION:  1. No acute or interval finding.  2. ASPECTS is 10    PROCEDURES:  Critical  Care performed: Yes, see critical care procedure note(s)  .Critical Care Performed by: Arta Silence, MD Authorized by: Arta Silence, MD   Critical care provider statement:    Critical care time (minutes):  30   Critical care was necessary to treat or prevent imminent or life-threatening deterioration of the following conditions:  CNS failure or compromise   Critical care was time spent personally by me on the following activities:  Development of treatment plan with patient or surrogate, discussions with consultants, evaluation of patient's response to treatment, examination of patient, ordering and review of laboratory studies, ordering and review of radiographic studies, ordering and performing treatments and interventions, pulse oximetry, re-evaluation of patient's condition and review of old charts   Care discussed with: admitting provider     MEDICATIONS ORDERED IN ED: Medications   sodium chloride flush (NS) 0.9 % injection 3 mL (3 mLs Intravenous Given 12/08/21 1054)     IMPRESSION / MDM / [place] / ED COURSE  I reviewed the triage vital signs and the nursing notes.  81 year old female with PMH as noted above presents with acute onset of right facial droop and slurred speech.  Code stroke was activated, and I met the patient at the ED entrance with Dr. Theda Sers from neurology.  The patient was cleared to go to CT.  On exam she has right facial droop and some slurred speech although the symptoms are somewhat waxing and waning.  I reviewed the past medical records, including the hospitalist discharge summary from her most recent admission on 1/13.  The patient presented with weakness and chest pressure.  Stroke was ruled out at that time.  MI was also ruled out, although the patient had elevated troponin due to demand ischemia.  She was significantly hypertensive but this was controlled in the ED as well.  Differential diagnosis includes, but is not limited to, ischemic CVA, TIA, hemorrhagic stroke, hypertensive encephalopathy, metabolic etiology.  She is on Eliquis and is not a tPA candidate.  I consulted Dr. Theda Sers from neurology who evaluated the patient in person, and discussed the plan with him.  He does not advise any acute intervention, but recommends monitoring the patient's blood pressure and admission for stroke work-up.  ----------------------------------------- 12:01 PM on 12/08/2021 -----------------------------------------  Lab work-up so far is under workable.  I consulted Dr. Blaine Hamper from the hospitalist service; based on our discussion he agrees to admit the patient.   FINAL CLINICAL IMPRESSION(S) / ED DIAGNOSES   Final diagnoses:  None     Rx / DC Orders   ED Discharge Orders     None        Note:  This document was prepared using Dragon voice recognition software and may include unintentional dictation errors.    Arta Silence, MD 12/08/21 1202
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
## 2021-12-08 NOTE — Progress Notes (Signed)
Chaplain responded to Code Stroke.  Pt not available. No family present.  Please contact if support is needed.   Minus [place], [place] Pager:  [phone]    12/08/21 1000  Clinical Encounter Type  Visited With Patient not available  Visit Type Initial  Referral From Nurse  Consult/Referral To Chaplain  Stress Factors  Patient Stress Factors Health changes

## 2021-12-08 NOTE — ED Notes (Addendum)
Pt hard to arouse verbally. But with painful stimuli she opens her eyes and looks at you. Pt is throwing herself to the right side in her bed and forced twitching as if she is having uncontrollable pseudo body movements. But easily maintains upright when placed back in upright position.  EDP to bedside. MD made aware.

## 2021-12-09 DIAGNOSIS — I480 Paroxysmal atrial fibrillation: Secondary | ICD-10-CM | POA: Diagnosis not present

## 2021-12-09 DIAGNOSIS — G459 Transient cerebral ischemic attack, unspecified: Secondary | ICD-10-CM | POA: Diagnosis not present

## 2021-12-09 DIAGNOSIS — I10 Essential (primary) hypertension: Secondary | ICD-10-CM | POA: Diagnosis not present

## 2021-12-09 NOTE — Care Management Obs Status (Signed)
[facility] NOTIFICATION   Patient Details  Name: TYSHAE STAIR MRN: [id number] Date of Birth: 29-Apr-1941   Medicare Observation Status Notification Given:  Yes    Shelbie Hutching, RN 12/09/2021, 3:00 PM

## 2021-12-09 NOTE — Evaluation (Signed)
Occupational Therapy Evaluation Patient Details Name: Erin Sutton MRN: [id number] DOB: [birth year] Today's Date: 12/09/2021   History of Present Illness Pt admitted to [facility] on 12/18/21 for c/o HA, weakness, R facial droop, and dysarthria. Significant PMH includes: TIA, Afib on Eliquis, HTN, hypothyroidism, and dementia. With recent admission 1/9-1/12 for AMS, DC to SNF. Elevated troponin due to demand ischemia.   Clinical Impression   Pt seen for OT evaluation this date. Upon arrival to room, pt awake and sitting upright in bed. Pt A&Ox4, reporting no pain, and agreeable to OT evaluation. Prior to admission, pt was receiving rehab at [facility] following recent hospitalization. At baseline, pt is mod-independent in all ADLs and functional mobility, living in a 1-story mobile home alone. Pt currently endorses decreased strength and activity tolerance. No sensory, coordination, or visual deficits noted during evaluation. Due to current functional impairments, pt requires SUPERVISION for bed mobility, SUPERVISION for sit>stand LB dressing, SUPERVISION for standing UB dressing, and SUPERVISION for functional mobility of household distance (123ft) with RW. Pt demonstrates improved balance, strength, and activity tolerance compared to most recent hospitalization and was able to independently reach outside BOS while standing to pick up medication and swallow while standing. Pt would benefit from additional skilled OT services to maximize return to PLOF and minimize risk of future falls, injury, caregiver burden, and readmission. Upon discharge, recommend HHOT services (although pt stating preference to return to SNF and expressing concern regarding getting transportation home and gathering belongings from SNF; [facility] informed).      Recommendations for follow up therapy are one component of a multi-disciplinary discharge planning process, led by the attending physician.  Recommendations may be updated based on patient  status, additional functional criteria and insurance authorization.   Follow Up Recommendations  Home health OT    Assistance Recommended at Discharge Frequent or constant Supervision/Assistance  Patient can return home with the following A little help with walking and/or transfers;A little help with bathing/dressing/bathroom;Assistance with cooking/housework    Functional Status Assessment  Patient has had a recent decline in their functional status and demonstrates the ability to make significant improvements in function in a reasonable and predictable amount of time.  Equipment Recommendations  None recommended by OT       Precautions / Restrictions Precautions Precautions: Fall Restrictions Weight Bearing Restrictions: No      Mobility Bed Mobility Overal bed mobility: Needs Assistance Bed Mobility: Sit to Supine     Supine to sit: Supervision;HOB elevated          Transfers Overall transfer level: Needs assistance Equipment used: None Transfers: Sit to/from Stand;Bed to chair/wheelchair/BSC Sit to Stand: Supervision Stand pivot transfers: Supervision                Balance Overall balance assessment: Needs assistance Sitting-balance support: No upper extremity supported;Feet supported Sitting balance-Leahy Scale: Good Sitting balance - Comments: Good sitting balance reaching outside BOS to don underwear   Standing balance support: No upper extremity supported;During functional activity Standing balance-Leahy Scale: Good Standing balance comment: able to maintain balance without UE support during standing UB dressing                           ADL either performed or assessed with clinical judgement   ADL Overall ADL's : Needs assistance/impaired Eating/Feeding: Independent Eating/Feeding Details (indicate cue type and reason): while standing, pt able to reach outside BOS to grab medication, put in mouth, and swallow without  anycoughing or  LOB observed             Upper Body Dressing : Supervision/safety;Set up;Standing   Lower Body Dressing: Supervision/safety;Set up;Sit to/from stand Lower Body Dressing Details (indicate cue type and reason): to don underwear             Functional mobility during ADLs: Supervision/safety;Rolling walker (2 wheels) (to walk 152ft)       Vision Ability to See in Adequate Light: 0 Adequate Patient Visual Report: No change from baseline              Pertinent Vitals/Pain Pain Assessment: No/denies pain     Hand Dominance Right   Extremity/Trunk Assessment Upper Extremity Assessment Upper Extremity Assessment: Overall WFL for tasks assessed   Lower Extremity Assessment Lower Extremity Assessment: Generalized weakness       Communication Communication Communication: No difficulties   Cognition Arousal/Alertness: Awake/alert Behavior During Therapy: WFL for tasks assessed/performed Overall Cognitive Status: Within Functional Limits for tasks assessed                                 General Comments: A&Ox4        Exercises Other Exercises Other Exercises: pt verbalized preference to go to SNF following d/c to regain strength. Discussed benefits of HHOT vs SNF, however pt verbalizing concern regarding obataining belongings from rehab facility and getting transportation home        Home Living Family/patient expects to be discharged to:: Skilled nursing facility (presented to ED from [facility]) Living Arrangements: Alone Available Help at Discharge: Neighbor;Available PRN/intermittently (per pt - no one assist) Type of Home: Mobile home Home Access: Ramped entrance Entrance Stairs-Number of Steps: 3 steps but ramp is built Entrance Stairs-Rails: Right Home Layout: One level     Bathroom Shower/Tub: Teacher, early years/pre: Standard Bathroom Accessibility: No   Home Equipment: Cane - single point;BSC/3in1;Tub bench;Hand held  shower head;Other (comment) (3 wheel walker)   Additional Comments: uses 3-wheel walker for standing/ambulation      Prior Functioning/Environment Prior Level of Function : Independent/Modified Independent             Mobility Comments: Reports no falls in the last year but decreased mobility in last few weeks as she has been admitted to hospital multiple times. Was receiving HHPT for R TKA (Novemeber 2022). States she does have trouble getting in and out of bed sometimes. ADLs Comments: IND with ADLs. Neighbor sometimes assists with IADL        OT Problem List: Decreased strength;Impaired balance (sitting and/or standing);Decreased cognition;Decreased activity tolerance;Decreased safety awareness      OT Treatment/Interventions: Self-care/ADL training;DME and/or AE instruction;Therapeutic activities;Balance training;Therapeutic exercise;Energy conservation;Patient/family education    OT Goals(Current goals can be found in the care plan section) Acute Rehab OT Goals Patient Stated Goal: to get stronger OT Goal Formulation: With patient Time For Goal Achievement: 12/23/21 Potential to Achieve Goals: Good  OT Frequency: Min 2X/week       AM-PAC OT "6 Clicks" Daily Activity     Outcome Measure Help from another person eating meals?: None Help from another person taking care of personal grooming?: None Help from another person toileting, which includes using toliet, bedpan, or urinal?: A Little Help from another person bathing (including washing, rinsing, drying)?: A Little Help from another person to put on and taking off regular upper body clothing?: None Help from another person  to put on and taking off regular lower body clothing?: A Little 6 Click Score: 21   End of Session Equipment Utilized During Treatment: Rolling walker (2 wheels) Nurse Communication: Mobility status  Activity Tolerance: Patient tolerated treatment well Patient left: in chair;with call bell/phone  within reach  OT Visit Diagnosis: Unsteadiness on feet (R26.81);Muscle weakness (generalized) (M62.81)                Time: 8756-4332 OT Time Calculation (min): 29 min Charges:  OT General Charges $OT Visit: 1 Visit OT Evaluation $OT Eval Moderate Complexity: 1 Mod OT Treatments $Self Care/Home Management : 8-22 mins  Fredirick Maudlin, OTR/L [place]

## 2021-12-09 NOTE — ED Notes (Signed)
Pt repositioned in bed and warm blankets applied
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
## 2021-12-09 NOTE — ED Notes (Signed)
NP Foust made aware of patients low blood pressure. Fluids at 75 were started.
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
## 2021-12-09 NOTE — Evaluation (Signed)
Physical Therapy Evaluation Patient Details Name: Erin Sutton MRN: [id number] DOB: [birth year] Today's Date: 12/09/2021  History of Present Illness  Pt admitted to [facility] on 12/18/21 for c/o HA, weakness, R facial droop, and dysarthria. Significant PMH includes: TIA, Afib on Eliquis, HTN, hypothyroidism, and dementia. With recent admission 1/9-1/12 for AMS, DC to SNF. Elevated troponin due to demand ischemia.    Clinical Impression  Pt received seated in recliner upon arrival to room, pt agreeable to therapy.  Pt able to ambulate around the large loop of the ED and back to room without difficulty.  Pt does have some fatigue in the LE's and claims to not be able to bend the knee as much due to the TKA she had performed back in November.  Pt left in room and was educated on d/c options and how it would be more beneficial for pt to return to home with HHPT rather than at a SNF.  Pt unsure of being d/c home due to heart condition and not having appropriate treatments at home.  Pt will benefit from skilled PT intervention to increase independence and safety with basic mobility in preparation for discharge to the venue listed below.        Recommendations for follow up therapy are one component of a multi-disciplinary discharge planning process, led by the attending physician.  Recommendations may be updated based on patient status, additional functional criteria and insurance authorization.  Follow Up Recommendations Home health PT    Assistance Recommended at Discharge Intermittent Supervision/Assistance  Patient can return home with the following       Equipment Recommendations None recommended by PT  Recommendations for Other Services       Functional Status Assessment Patient has had a recent decline in their functional status and demonstrates the ability to make significant improvements in function in a reasonable and predictable amount of time.     Precautions / Restrictions  Precautions Precautions: Fall Restrictions Weight Bearing Restrictions: No Other Position/Activity Restrictions: R TKA in November 2022      Mobility  Bed Mobility Overal bed mobility: Needs Assistance Bed Mobility: Sit to Supine     Supine to sit: Supervision;HOB elevated     General bed mobility comments: pt upright in recliner upon arrival.    Transfers Overall transfer level: Needs assistance Equipment used: None Transfers: Sit to/from Stand;Bed to chair/wheelchair/BSC Sit to Stand: Supervision Stand pivot transfers: Supervision         General transfer comment: SBA provided for pt safety.    Ambulation/Gait Ambulation/Gait assistance: Min guard Gait Distance (Feet): 200 Feet Assistive device: Rolling walker (2 wheels) Gait Pattern/deviations: Step-through pattern;Narrow base of support Gait velocity: decreased     General Gait Details: patient required Min guard for safety. verbal cues for technique using rolling walker.  Stairs            Wheelchair Mobility    Modified Rankin (Stroke Patients Only)       Balance Overall balance assessment: Needs assistance Sitting-balance support: No upper extremity supported;Feet supported Sitting balance-Leahy Scale: Good Sitting balance - Comments: good sitting balance within recliner   Standing balance support: No upper extremity supported;During functional activity Standing balance-Leahy Scale: Good Standing balance comment: able to maintain balance without UE support during line/lead management.                             Pertinent Vitals/Pain Pain Assessment: No/denies pain  Home Living Family/patient expects to be discharged to:: Private residence (presented to ED from [facility]) Living Arrangements: Alone Available Help at Discharge: Neighbor;Available PRN/intermittently (per pt - no one assist) Type of Home: Mobile home Home Access: Ramped entrance Entrance Stairs-Rails:  Right Entrance Stairs-Number of Steps: 3 steps but ramp is built   Home Layout: One level Home Equipment: Cane - single point;BSC/3in1;Tub bench;Hand held shower head;Other (comment) (3 wheel walker) Additional Comments: uses 3-wheel walker for standing/ambulation    Prior Function Prior Level of Function : Independent/Modified Independent             Mobility Comments: Reports no falls in the last year but decreased mobility in last few weeks as she has been admitted to hospital multiple times. Was receiving HHPT for R TKA (Novemeber 2022). States she does have trouble getting in and out of bed sometimes. ADLs Comments: IND with ADLs. Neighbor sometimes assists with IADL     Hand Dominance   Dominant Hand: Right    Extremity/Trunk Assessment   Upper Extremity Assessment Upper Extremity Assessment: Overall WFL for tasks assessed    Lower Extremity Assessment Lower Extremity Assessment: Overall WFL for tasks assessed       Communication   Communication: No difficulties  Cognition Arousal/Alertness: Awake/alert Behavior During Therapy: WFL for tasks assessed/performed Overall Cognitive Status: Within Functional Limits for tasks assessed                                 General Comments: A&Ox4        General Comments      Exercises Other Exercises Other Exercises: pt verbalized preference to go to SNF following d/c to regain strength. Discussed benefits of HHOT vs SNF, however pt verbalizing concern regarding obataining belongings from rehab facility and getting transportation home Other Exercises: Pt educated on role of Pt and services provided during hospital stay.  Pt also educated on recommendation of d/c to home with HHPT.   Assessment/Plan    PT Assessment Patient needs continued PT services  PT Problem List Decreased strength;Decreased activity tolerance;Decreased knowledge of use of DME;Decreased balance;Decreased mobility;Decreased knowledge  of precautions;Decreased safety awareness;Decreased range of motion       PT Treatment Interventions DME instruction;Therapeutic exercise;Gait training;Balance training;Neuromuscular re-education;Functional mobility training;Therapeutic activities;Patient/family education;Cognitive remediation    PT Goals (Current goals can be found in the Care Plan section)  Acute Rehab PT Goals Patient Stated Goal: to get stronger PT Goal Formulation: With patient Time For Goal Achievement: 12/23/21 Potential to Achieve Goals: Good    Frequency Min 2X/week     Co-evaluation               AM-PAC PT "6 Clicks" Mobility  Outcome Measure Help needed turning from your back to your side while in a flat bed without using bedrails?: A Little Help needed moving from lying on your back to sitting on the side of a flat bed without using bedrails?: A Little Help needed moving to and from a bed to a chair (including a wheelchair)?: A Little Help needed standing up from a chair using your arms (e.g., wheelchair or bedside chair)?: A Little Help needed to walk in hospital room?: A Little Help needed climbing 3-5 steps with a railing? : A Little 6 Click Score: 18    End of Session Equipment Utilized During Treatment: Gait belt Activity Tolerance: Patient limited by fatigue;Patient limited by lethargy Patient left: in  chair;with call bell/phone within reach Nurse Communication: Mobility status;Other (comment) PT Visit Diagnosis: Unsteadiness on feet (R26.81);Muscle weakness (generalized) (M62.81);Difficulty in walking, not elsewhere classified (R26.2)    Time: 3567-0141 PT Time Calculation (min) (ACUTE ONLY): 30 min   Charges:   PT Evaluation $PT Eval Low Complexity: 1 Low PT Treatments $Gait Training: 23-37 mins        [name], PT, DPT 12/09/21, 2:01 PM   [name] 12/09/2021, 1:54 PM

## 2021-12-09 NOTE — Progress Notes (Signed)
PROGRESS NOTE    Erin Sutton  VWU:[id number] DOB: 16-Jun-1941 DOA: 12/08/2021 PCP: Baxter Hire, MD  Assessment & Plan:   Principal Problem:   TIA (transient ischemic attack) Active Problems:   Hypothyroidism   HTN (hypertension)   Bilateral pulmonary embolism (HCC)   Hyperlipidemia   Left leg DVT ([place])   Hyperparathyroidism ([place])   Paroxysmal atrial fibrillation (HCC)   Hypertensive urgency   Bradycardia   TIA: presents with slurred speech, right facial droop and headache. Pt refused MRI brain. Continue on statin, aspirin, eliquis. Continue w/ neuro checks. Neuro consulted. PT/OT   Hypothyroidism: continue on levothyroxine    Hypertensive urgency: w/ hx of HTN. Urgency resolved but still w/ HTN. Continue to hold amlodipine, metoprolol secondary to bradycardia    B/l PE & left leg DVT: continue on eliquis    HLD: continue on statin    Hyperparathyroidism: management as an outpatient    PAF: continue on eliquis. Continue to hold metoprolol secondary to bradycardia    Bradycardia: continue to hold metoprolol. Atropine prn for symptomatic bradycardia     DVT prophylaxis: eliquis  Code Status: full  Family Communication:  Disposition Plan: depends on PT/OT recs   Level of care: Progressive  Status is: Observation  The patient remains OBS appropriate and will d/c before 2 midnights.       Consultants:  Neuro   Procedures:   Antimicrobials:    Subjective: Pt c/o fatigue   Objective: Vitals:   12/09/21 0420 12/09/21 0530 12/09/21 0540 12/09/21 0600  BP: (!) 90/49 131/61 (!) 90/49 (!) 141/58  Pulse: 62 75 (!) 54 70  Resp: 16 18 12 19   Temp:      TempSrc:      SpO2: 95% 99% 95% 99%  Weight:      Height:        Intake/Output Summary (Last 24 hours) at 12/09/2021 0817 Last data filed at 12/08/2021 1918 Gross per 24 hour  Intake 1000 ml  Output 650 ml  Net 350 ml   Filed Weights   12/08/21 1004  Weight: 65.3 kg     Examination:  General exam: Appears calm and comfortable  Respiratory system: Clear to auscultation. Respiratory effort normal. Cardiovascular system: S1 & S2 +. No rubs, gallops or clicks.  Gastrointestinal system: Abdomen is nondistended, soft and nontender. Normal bowel sounds heard. Central nervous system: Alert and oriented. Moves all extremities  Psychiatry: Judgement and insight appear normal. Flat mood and affect    Data Reviewed: I have personally reviewed following labs and imaging studies  CBC: Recent Labs  Lab 12/02/21 1551 12/04/21 0430 12/05/21 0525 12/06/21 0653 12/08/21 1003  WBC 5.8 6.1 6.7 6.9 6.4  NEUTROABS 3.9  --   --   --  4.2  HGB 11.8* 10.9* 10.2* 10.0* 12.6  HCT 36.2 33.8* 32.2* 31.5* 39.5  MCV 89.4 88.9 91.7 91.3 90.4  PLT 262 263 234 227 295   Basic Metabolic Panel: Recent Labs  Lab 12/02/21 1551 12/04/21 0430 12/05/21 0525 12/06/21 0653 12/08/21 1003  NA 132* 137 139 135 136  K 4.0 4.1 4.2 4.1 3.9  CL 98 103 106 104 101  CO2 25 25 26 24 26   GLUCOSE 102* 103* 97 89 103*  BUN 11 24* 24* 19 13  CREATININE 0.77 0.99 1.01* 0.82 0.82  CALCIUM 9.7 9.4 8.9 8.9 10.0  MG  --  1.9 1.8 2.3  --    GFR: Estimated Creatinine Clearance: 49.8 mL/min (by C-G  formula based on SCr of 0.82 mg/dL). Liver Function Tests: Recent Labs  Lab 12/02/21 1551 12/04/21 0431 12/08/21 1003  AST 18 18 33  ALT 10 9 16   ALKPHOS 48 39 59  BILITOT 0.6 0.6 0.6  PROT 6.9 6.1* 8.2*  ALBUMIN 3.8 3.6 4.5   Recent Labs  Lab 12/04/21 0430  LIPASE 40   No results for input(s): AMMONIA in the last 168 hours. Coagulation Profile: Recent Labs  Lab 12/02/21 1551 12/08/21 1003  INR 1.5* 1.5*   Cardiac Enzymes: No results for input(s): CKTOTAL, CKMB, CKMBINDEX, TROPONINI in the last 168 hours. BNP (last 3 results) No results for input(s): PROBNP in the last 8760 hours. HbA1C: No results for input(s): HGBA1C in the last 72 hours. CBG: Recent Labs  Lab  12/02/21 1525 12/08/21 0944  GLUCAP 105* 98   Lipid Profile: No results for input(s): CHOL, HDL, LDLCALC, TRIG, CHOLHDL, LDLDIRECT in the last 72 hours. Thyroid Function Tests: No results for input(s): TSH, T4TOTAL, FREET4, T3FREE, THYROIDAB in the last 72 hours. Anemia Panel: No results for input(s): VITAMINB12, FOLATE, FERRITIN, TIBC, IRON, RETICCTPCT in the last 72 hours. Sepsis Labs: Recent Labs  Lab 12/02/21 1513 12/02/21 1713  LATICACIDVEN 1.4 1.3    Recent Results (from the past 240 hour(s))  Culture, blood (Routine x 2)     Status: None   Collection Time: 12/02/21  3:50 PM   Specimen: BLOOD  Result Value Ref Range Status   Specimen Description BLOOD LAC  Final   Special Requests BOTTLES DRAWN AEROBIC AND ANAEROBIC BCAV  Final   Culture   Final    NO GROWTH 5 DAYS Performed at [facility], [address] [place], [place] [address]    Report Status 12/07/2021 FINAL  Final  Resp Panel by RT-PCR (Flu A&B, Covid) Nasopharyngeal Swab     Status: None   Collection Time: 12/02/21  3:51 PM   Specimen: Nasopharyngeal Swab; Nasopharyngeal(NP) swabs in vial transport medium  Result Value Ref Range Status   SARS Coronavirus 2 by RT PCR NEGATIVE NEGATIVE Final    Comment: (NOTE) SARS-CoV-2 target nucleic acids are NOT DETECTED.  The SARS-CoV-2 RNA is generally detectable in upper respiratory specimens during the acute phase of infection. The lowest concentration of SARS-CoV-2 viral copies this assay can detect is 138 copies/mL. A negative result does not preclude SARS-Cov-2 infection and should not be used as the sole basis for treatment or other patient management decisions. A negative result may occur with  improper specimen collection/handling, submission of specimen other than nasopharyngeal swab, presence of viral mutation(s) within the areas targeted by this assay, and inadequate number of viral copies(<138 copies/mL). A negative result must be combined  with clinical observations, patient history, and epidemiological information. The expected result is Negative.  Fact Sheet for Patients:  EntrepreneurPulse.com.au  Fact Sheet for Healthcare Providers:  IncredibleEmployment.be  This test is no t yet approved or cleared by the [place] FDA and  has been authorized for detection and/or diagnosis of SARS-CoV-2 by FDA under an Emergency Use Authorization (EUA). This EUA will remain  in effect (meaning this test can be used) for the duration of the COVID-19 declaration under Section 564(b)(1) of the Act, 21 U.S.C.section 360bbb-3(b)(1), unless the authorization is terminated  or revoked sooner.       Influenza A by PCR NEGATIVE NEGATIVE Final   Influenza B by PCR NEGATIVE NEGATIVE Final    Comment: (NOTE) The Xpert Xpress SARS-CoV-2/FLU/RSV plus assay is intended  as an aid in the diagnosis of influenza from Nasopharyngeal swab specimens and should not be used as a sole basis for treatment. Nasal washings and aspirates are unacceptable for Xpert Xpress SARS-CoV-2/FLU/RSV testing.  Fact Sheet for Patients: EntrepreneurPulse.com.au  Fact Sheet for Healthcare Providers: IncredibleEmployment.be  This test is not yet approved or cleared by the [place] FDA and has been authorized for detection and/or diagnosis of SARS-CoV-2 by FDA under an Emergency Use Authorization (EUA). This EUA will remain in effect (meaning this test can be used) for the duration of the COVID-19 declaration under Section 564(b)(1) of the Act, 21 U.S.C. section 360bbb-3(b)(1), unless the authorization is terminated or revoked.  Performed at [facility], [address], [place], [place] [address]   Urine Culture     Status: Abnormal   Collection Time: 12/02/21  3:51 PM   Specimen: Urine, Clean Catch  Result Value Ref Range Status   Specimen Description   Final     URINE, CLEAN CATCH Performed at [facility], [address], [place], [place] [address]    Special Requests   Final    NONE Performed at [facility], [address] [place], [place] [address]    Culture MULTIPLE SPECIES PRESENT, SUGGEST RECOLLECTION (A)  Final   Report Status 12/03/2021 FINAL  Final  Culture, blood (Routine x 2)     Status: None   Collection Time: 12/02/21  4:00 PM   Specimen: BLOOD  Result Value Ref Range Status   Specimen Description BLOOD [facility]  Final   Special Requests BOTTLES DRAWN AEROBIC AND ANAEROBIC BCAV  Final   Culture   Final    NO GROWTH 5 DAYS Performed at [facility], [address] [place],  [address]    Report Status 12/07/2021 FINAL  Final  Resp Panel by RT-PCR (Flu A&B, Covid) Nasopharyngeal Swab     Status: None   Collection Time: 12/08/21 11:12 AM   Specimen: Nasopharyngeal Swab; Nasopharyngeal(NP) swabs in vial transport medium  Result Value Ref Range Status   SARS Coronavirus 2 by RT PCR NEGATIVE NEGATIVE Final    Comment: (NOTE) SARS-CoV-2 target nucleic acids are NOT DETECTED.  The SARS-CoV-2 RNA is generally detectable in upper respiratory specimens during the acute phase of infection. The lowest concentration of SARS-CoV-2 viral copies this assay can detect is 138 copies/mL. A negative result does not preclude SARS-Cov-2 infection and should not be used as the sole basis for treatment or other patient management decisions. A negative result may occur with  improper specimen collection/handling, submission of specimen other than nasopharyngeal swab, presence of viral mutation(s) within the areas targeted by this assay, and inadequate number of viral copies(<138 copies/mL). A negative result must be combined with clinical observations, patient history, and epidemiological information. The expected result is Negative.  Fact Sheet for Patients:  EntrepreneurPulse.com.au  Fact  Sheet for Healthcare Providers:  IncredibleEmployment.be  This test is no t yet approved or cleared by the [place] FDA and  has been authorized for detection and/or diagnosis of SARS-CoV-2 by FDA under an Emergency Use Authorization (EUA). This EUA will remain  in effect (meaning this test can be used) for the duration of the COVID-19 declaration under Section 564(b)(1) of the Act, 21 U.S.C.section 360bbb-3(b)(1), unless the authorization is terminated  or revoked sooner.       Influenza A by PCR NEGATIVE NEGATIVE Final   Influenza B by PCR NEGATIVE NEGATIVE Final    Comment: (NOTE) The  Xpert Xpress SARS-CoV-2/FLU/RSV plus assay is intended as an aid in the diagnosis of influenza from Nasopharyngeal swab specimens and should not be used as a sole basis for treatment. Nasal washings and aspirates are unacceptable for Xpert Xpress SARS-CoV-2/FLU/RSV testing.  Fact Sheet for Patients: EntrepreneurPulse.com.au  Fact Sheet for Healthcare Providers: IncredibleEmployment.be  This test is not yet approved or cleared by the [place] FDA and has been authorized for detection and/or diagnosis of SARS-CoV-2 by FDA under an Emergency Use Authorization (EUA). This EUA will remain in effect (meaning this test can be used) for the duration of the COVID-19 declaration under Section 564(b)(1) of the Act, 21 U.S.C. section 360bbb-3(b)(1), unless the authorization is terminated or revoked.  Performed at [facility], [address] [place], [place] [address]          Radiology Studies: CT HEAD CODE STROKE WO CONTRAST  Result Date: 12/08/2021 CLINICAL DATA:  Code stroke.  Facial droop with slurred speech EXAM: CT HEAD WITHOUT CONTRAST TECHNIQUE: Contiguous axial images were obtained from the base of the skull through the vertex without intravenous contrast. RADIATION DOSE REDUCTION: This exam was performed according  to the departmental dose-optimization program which includes automated exposure control, adjustment of the mA and/or kV according to patient size and/or use of iterative reconstruction technique. COMPARISON:  Brain MRI from 6 days ago FINDINGS: Brain: No evidence of acute infarction, hemorrhage, hydrocephalus, extra-axial collection or mass lesion/mass effect. 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 Vascular: No hyperdense vessel or unexpected calcification. Skull: Normal. Negative for fracture or focal lesion. Sinuses/Orbits: No acute finding. Other: These results were communicated to Dr. Theda Sers at 10:00 am on 12/08/2021 by text page via the [facility] messaging system. ASPECTS [facility] Stroke Program Early CT Score) - Ganglionic level infarction (caudate, lentiform nuclei, internal capsule, insula, M1-M3 cortex): 7 - Supraganglionic infarction (M4-M6 cortex): 3 Total score (0-10 with 10 being normal): 10 IMPRESSION: 1. No acute or interval finding. 2. ASPECTS is 10 Electronically Signed   By: Jorje Guild M.D.   On: 12/08/2021 10:00        Scheduled Meds:   stroke: mapping our early stages of recovery book   Does not apply Once   apixaban  5 mg Oral BID   aspirin  81 mg Oral Daily   atorvastatin  40 mg Oral Daily   levothyroxine  50 mcg Oral QAC breakfast   methocarbamol  500 mg Oral QHS   pantoprazole  40 mg Oral BID   traZODone  50 mg Oral QHS   Continuous Infusions:  sodium chloride 75 mL/hr at 12/09/21 0330     LOS: 0 days    Time spent: 30 mins     Wyvonnia Dusky, MD [facility] Pager [phone]  If 7PM-7AM, please contact night-coverage 12/09/2021, 8:17 AM

## 2021-12-10 DIAGNOSIS — M6281 Muscle weakness (generalized): Secondary | ICD-10-CM | POA: Diagnosis not present

## 2021-12-10 DIAGNOSIS — A419 Sepsis, unspecified organism: Secondary | ICD-10-CM | POA: Diagnosis not present

## 2021-12-10 DIAGNOSIS — I10 Essential (primary) hypertension: Secondary | ICD-10-CM | POA: Diagnosis not present

## 2021-12-10 DIAGNOSIS — I480 Paroxysmal atrial fibrillation: Secondary | ICD-10-CM | POA: Diagnosis not present

## 2021-12-10 DIAGNOSIS — I160 Hypertensive urgency: Secondary | ICD-10-CM | POA: Diagnosis not present

## 2021-12-10 DIAGNOSIS — E039 Hypothyroidism, unspecified: Secondary | ICD-10-CM | POA: Diagnosis not present

## 2021-12-10 DIAGNOSIS — G459 Transient cerebral ischemic attack, unspecified: Secondary | ICD-10-CM | POA: Diagnosis not present

## 2021-12-10 DIAGNOSIS — G9340 Encephalopathy, unspecified: Secondary | ICD-10-CM | POA: Diagnosis not present

## 2021-12-10 DIAGNOSIS — R471 Dysarthria and anarthria: Secondary | ICD-10-CM | POA: Diagnosis not present

## 2021-12-10 DIAGNOSIS — Z7401 Bed confinement status: Secondary | ICD-10-CM | POA: Diagnosis not present

## 2021-12-10 DIAGNOSIS — G4733 Obstructive sleep apnea (adult) (pediatric): Secondary | ICD-10-CM | POA: Diagnosis not present

## 2021-12-10 DIAGNOSIS — Z96651 Presence of right artificial knee joint: Secondary | ICD-10-CM | POA: Diagnosis not present

## 2021-12-10 DIAGNOSIS — F0390 Unspecified dementia without behavioral disturbance: Secondary | ICD-10-CM | POA: Diagnosis not present

## 2021-12-10 DIAGNOSIS — I482 Chronic atrial fibrillation, unspecified: Secondary | ICD-10-CM | POA: Diagnosis not present

## 2021-12-10 DIAGNOSIS — Z20822 Contact with and (suspected) exposure to covid-19: Secondary | ICD-10-CM | POA: Diagnosis not present

## 2021-12-10 DIAGNOSIS — I639 Cerebral infarction, unspecified: Secondary | ICD-10-CM | POA: Diagnosis not present

## 2021-12-10 DIAGNOSIS — Z8673 Personal history of transient ischemic attack (TIA), and cerebral infarction without residual deficits: Secondary | ICD-10-CM | POA: Diagnosis not present

## 2021-12-10 LAB — BASIC METABOLIC PANEL
Anion gap: 6 (ref 5–15)
BUN: 16 mg/dL (ref 8–23)
CO2: 27 mmol/L (ref 22–32)
Calcium: 9.3 mg/dL (ref 8.9–10.3)
Chloride: 104 mmol/L (ref 98–111)
Creatinine, Ser: 1.03 mg/dL — ABNORMAL HIGH (ref 0.44–1.00)
GFR, Estimated: 55 mL/min — ABNORMAL LOW (ref >60)
Glucose, Bld: 88 mg/dL (ref 70–99)
Potassium: 4.2 mmol/L (ref 3.5–5.1)
Sodium: 137 mmol/L (ref 135–145)

## 2021-12-10 LAB — CBC
HCT: 30.1 % — ABNORMAL LOW (ref 36.0–46.0)
Hemoglobin: 9.6 g/dL — ABNORMAL LOW (ref 12.0–15.0)
MCH: 29 pg (ref 26.0–34.0)
MCHC: 31.9 g/dL (ref 30.0–36.0)
MCV: 90.9 fL (ref 80.0–100.0)
Platelets: 246 10*3/uL (ref 150–400)
RBC: 3.31 MIL/uL — ABNORMAL LOW (ref 3.87–5.11)
RDW: 16.7 % — ABNORMAL HIGH (ref 11.5–15.5)
WBC: 6.2 10*3/uL (ref 4.0–10.5)
nRBC: 0 % (ref 0.0–0.2)

## 2021-12-10 MED ORDER — POLYETHYLENE GLYCOL 3350 17 G PO PACK: 17.0000 g | PACK | Freq: Every day | ORAL | Status: DC | PRN

## 2021-12-10 MED ORDER — POLYETHYLENE GLYCOL 3350 17 GM/SCOOP PO POWD
17.0000 g | Freq: Every day | ORAL | Status: DC | PRN
  Filled 2021-12-10: qty 255

## 2021-12-10 NOTE — TOC Transition Note (Signed)
Transition of Care [facility]) - CM/SW Discharge Note   Patient Details  Name: Erin Sutton MRN: [id number] Date of Birth: 03-06-1941  [facility] CM/SW Contact:  Alberteen Sam, LCSW Phone Number: 12/10/2021, 1:56 PM   Clinical Narrative:     Patient will DC to: [facility] Anticipated DC date: 12/10/21 Family notified: daughter Immunologist by: [facility]  Per Otila Kluver at [facility] patient can return under her insurance to [facility] today as discharge back to facility would be within 72 hour window. She reports they are able to accept patient back today without new insurance auth and can use previous fl2, MD  informed.   Per MD patient ready for DC to [facility] . RN, patient, patient's family, and facility notified of DC. Discharge Summary sent to facility. RN given number for report [phone] Room 708. DC packet on chart. Ambulance transport requested for patient.  CSW signing off.  Pricilla Riffle, LCSW    Final next level of care: Skilled Nursing Facility Barriers to Discharge: No Barriers Identified   Patient Goals and CMS Choice Patient states their goals for this hospitalization and ongoing recovery are:: to go home CMS Medicare.gov Compare Post Acute Care list provided to:: Patient Represenative (must comment) (daughter tammy) Choice offered to / list presented to : Adult Children  Discharge Placement              Patient chooses bed at: [name] Patient to be transferred to facility by: [facility] Name of family member notified: daughter Tammy Patient and family notified of of transfer: 12/10/21  Discharge Plan and Services   Discharge Planning Services: CM Consult Post Acute Care Choice: [facility]          DME Arranged: N/A DME Agency: NA       HH Arranged: NA HH Agency: NA        Social Determinants of Health (SDOH) Interventions     Readmission Risk Interventions No flowsheet data found.

## 2021-12-10 NOTE — Progress Notes (Signed)
Attempted report, transferred to nurses station with no answer

## 2021-12-10 NOTE — Progress Notes (Signed)
Occupational Therapy Treatment Patient Details Name: Erin Sutton MRN: [id number] DOB: 1941/04/04 Today's Date: 12/10/2021   History of present illness Pt admitted to [facility] on 12/18/21 for c/o HA, weakness, R facial droop, and dysarthria. Significant PMH includes: TIA, Afib on Eliquis, HTN, hypothyroidism, and dementia. With recent admission 1/9-1/12 for AMS, DC to SNF. Elevated troponin due to demand ischemia.   OT comments  Pt seen for OT treatment on this date. Upon arrival to room, pt awake and sitting upright in bed. Pt agreeable to OT tx. Pt currently presents with decreased activity tolerance, and requires SUPERVISION for seated UB/LB dressing, SUPERVISION for toilet transfers/hygiene, SUPERVISION for standing hand hygiene, and SUPERVISION for functional mobility of household distances (164ft) with RW. Of note, resting HR 90s bpm, increasing to 120s bpm during activity (brief moment of HR 140s, however quickly returned to 120s during standing rest break); RN informed and pt educated on importance of monitoring symptoms and pacing activities. Pt is making good progress toward goals and continues to benefit from skilled OT services to maximize return to PLOF and minimize risk of future falls, injury, caregiver burden, and readmission. Will continue to follow POC. Discharge recommendation remains appropriate.     Recommendations for follow up therapy are one component of a multi-disciplinary discharge planning process, led by the attending physician.  Recommendations may be updated based on patient status, additional functional criteria and insurance authorization.    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       Mobility Bed Mobility Overal bed mobility: Modified Independent                  Transfers Overall transfer level: Needs assistance Equipment used: Rolling walker (2 wheels) Transfers: Sit to/from Stand Sit to Stand: Supervision                 Balance Overall balance assessment: Needs assistance Sitting-balance support: No upper extremity supported, Feet supported Sitting balance-Leahy Scale: Good Sitting balance - Comments: Good sitting balance during seated peri-care on toilet   Standing balance support: Bilateral upper extremity supported, During functional activity Standing balance-Leahy Scale: Good Standing balance comment: with supervision for monitoring vitals, pt walked 189ft with RW                           ADL either performed or assessed with clinical judgement   ADL Overall ADL's : Needs assistance/impaired     Grooming: Wash/dry hands;Supervision/safety;Standing           Upper Body Dressing : Supervision/safety;Set up;Sitting Upper Body Dressing Details (indicate cue type and reason): to don/doff hospital gown Lower Body Dressing: Supervision/safety;Set up;Sit to/from stand Lower Body Dressing Details (indicate cue type and reason): to don underwear Toilet Transfer: Supervision/safety;Ambulation;Regular Toilet;Rolling walker (2 wheels);Grab bars   Toileting- Clothing Manipulation and Hygiene: Supervision/safety;Sitting/lateral lean       Functional mobility during ADLs: Supervision/safety;Rolling walker (2 wheels) (to walk 112ft, requires cues for implementing energy conservation strategies)       Cognition Arousal/Alertness: Awake/alert Behavior During Therapy: WFL for tasks assessed/performed Overall Cognitive Status: Within Functional  Limits for tasks assessed                                                 General Comments Resting HR 90s bpm, increasing to 120s bpm during activity (brief moment of HR 140s, however quickly returned to 120s during standing rest break); RN informed    Pertinent Vitals/ Pain       Pain Assessment Pain Assessment: Faces Faces Pain Scale: Hurts a little bit Pain Location: headache Pain Descriptors / Indicators: Headache Pain Intervention(s): Limited activity within patient's tolerance, Monitored during session         Frequency  Min 2X/week        Progress Toward Goals  OT Goals(current goals can now be found in the care plan section)  Progress towards OT goals: Progressing toward goals  Acute Rehab OT Goals Patient Stated Goal: to get stronger OT Goal Formulation: With patient Time For Goal Achievement: 12/23/21 Potential to Achieve Goals: Good  Plan Discharge plan remains appropriate;Frequency remains appropriate       AM-PAC OT "6 Clicks" Daily Activity     Outcome Measure   Help from another person eating meals?: None Help from another person taking care of personal grooming?: None Help from another person toileting, which includes using toliet, bedpan, or urinal?: A Little Help from another person bathing (including washing, rinsing, drying)?: A Little Help from another person to put on and taking off regular upper body clothing?: None Help from another person to put on and taking off regular lower body clothing?: A Little 6 Click Score: 21    End of Session Equipment Utilized During Treatment: Rolling walker (2 wheels)  OT Visit Diagnosis: Unsteadiness on feet (R26.81);Muscle weakness (generalized) (M62.81)   Activity Tolerance Patient tolerated treatment well   Patient Left in chair;with call bell/phone within reach   Nurse Communication Mobility status        Time: 7253-6644 OT Time Calculation (min): 24 min  Charges: OT General Charges $OT Visit: 1  Visit OT Treatments $Self Care/Home Management : 23-37 mins  Fredirick Maudlin, [place]

## 2021-12-10 NOTE — Discharge Summary (Signed)
Physician Discharge Summary  [name] WSF:[id number] DOB: 10/01/1941 DOA: 12/08/2021  PCP: Baxter Hire, MD  Admit date: 12/08/2021 Discharge date: 12/10/2021  Admitted From: SNF Disposition:  SNF  Recommendations for Outpatient Follow-up:  Follow up with PCP in 1-2 weeks   Home Health: no  Equipment/Devices:  Discharge Condition: stable  CODE STATUS: full  Diet recommendation: Heart Healthy  Brief/Interim Summary:   HPI was taken from Dr. Blaine Hamper: Erin Sutton is a 81 y.o. 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   Patient was recently hospitalized from 1/9-1/13 due to elevated troponin which was thought due to elevated blood pressure.  Patient had weakness and had negative MRI for brain in hospital. Per her daughter, patient was discharged to rehab facility.   Per report, patient was last known normal at about 830, and was found to have slurred speech, right facial droop, complains of headache and generalized weakness.  Patient moves all extremities.  No unilateral numbness or tinglings in extremities.  Denies chest pain, cough, shortness breath.  No nausea vomiting, diarrhea or abdominal pain.  No symptoms of UTI.     Patient has elevated blood pressure up to 208/83. Pt initially has heart rate 55-70s. Per nurse report, patient was given 5 mg of IV hydralazine, her blood pressure dropped to 101/59, and had 1 episode of bradycardia with heart rate down 30, then come back to 40-50s.  Patient complains dizziness during this time.   ED Course: pt was found to have WBC 6.4, INR 1.5, PTT 37, troponin level 6, negative COVID PCR, GFR> 60, magnesium 2.3, RR 16, oxygen sat 95% on room air, temperature normal.  CT of head is negative for acute  intracranial abnormalities.  Dr. Theda Sers of neurology is consulted   Hospital course as per Dr. Jimmye Norman 1/16-1/17/23: Neuro saw the pt on 12/08/21 and found the pt to be at baseline and no further inpatient neuro was indicated as per neuro. Furthermore, pt's HTN urgency resolved prior to d/c. Pt still has HTN.   Discharge Diagnoses:  Principal Problem:   TIA (transient ischemic attack) Active Problems:   Hypothyroidism   HTN (hypertension)   Bilateral pulmonary embolism (HCC)   Hyperlipidemia   Left leg DVT (HCC)   Hyperparathyroidism (HCC)   Paroxysmal atrial fibrillation (HCC)   Hypertensive urgency   Bradycardia  TIA: presents with slurred speech, right facial droop and headache. Pt refused MRI brain. Continue on statin, aspirin, eliquis. Continue w/ neuro checks. Back to baseline as per neuro   Hypothyroidism: continue on levothyroxine    Hypertensive urgency: w/ hx of HTN. Urgency resolved but still w/ HTN. Can amlodipine, metoprolol at d/c. If pt becomes bradycardiac again, consider d/c indefinitely metoprolol & amlodipine and starting other class of medication for HTN    B/l PE & left leg DVT: continue on eliquis    HLD: continue on statin    Hyperparathyroidism: management as an outpatient    PAF: continue on eliquis. Continue to hold metoprolol secondary to bradycardia    Bradycardia:  can restart metoprolol & amlodipine at d/c. Atropine prn for symptomatic bradycardia.  If pt becomes bradycardiac again, consider d/c indefinitely metoprolol & amlodipine and starting other class of medication for HTN   Discharge Instructions  Discharge Instructions     Diet - low sodium heart healthy  Complete by: As directed    Discharge instructions   Complete by: As directed    F/u w/ PCP in 1-2 weeks   Increase activity slowly   Complete by: As directed       Allergies as of 12/10/2021       Reactions   Ace Inhibitors Cough   Levofloxacin    Penicillin G Other (See  Comments)   Tape Other (See Comments)   Other reaction(s): Other (See Comments)   Amoxicillin Rash   Has patient had a PCN reaction causing immediate rash, facial/tongue/throat swelling, SOB or lightheadedness with hypotension: No Has patient had a PCN reaction causing severe rash involving mucus membranes or skin necrosis: No Has patient had a PCN reaction that required hospitalization: No Has patient had a PCN reaction occurring within the last 10 years: No If all of the above answers are "NO", then may proceed with Cephalosporin use.   Benicar [olmesartan] Rash   Itching rash   Codeine Rash   Codeine Sulfate Rash   Levaquin [levofloxacin In D5w] Rash   Alters mental status   Penicillin V Potassium Rash   Has patient had a PCN reaction causing immediate rash, facial/tongue/throat swelling, SOB or lightheadedness with hypotension: No Has patient had a PCN reaction causing severe rash involving mucus membranes or skin necrosis: No Has patient had a PCN reaction that required hospitalization: No Has patient had a PCN reaction occurring within the last 10 years: No If all of the above answers are "NO", then may proceed with Cephalosporin use.   Sulfa Antibiotics Rash        Medication List     TAKE these medications    amLODipine 2.5 MG tablet Commonly known as: NORVASC Take 1 tablet (2.5 mg total) by mouth daily as needed (for SBP>180).   apixaban 5 MG Tabs tablet Commonly known as: ELIQUIS Take 1 tablet (5 mg total) by mouth 2 (two) times daily.   aspirin 81 MG chewable tablet Chew 1 tablet (81 mg total) by mouth daily.   atorvastatin 40 MG tablet Commonly known as: LIPITOR Take 40 mg by mouth daily.   diclofenac Sodium 1 % Gel Commonly known as: VOLTAREN Apply 1 application topically daily.   HYDROcodone-acetaminophen 5-325 MG tablet Commonly known as: NORCO/VICODIN Take 1 tablet by mouth every 4 (four) hours.   levothyroxine 50 MCG tablet Commonly known as:  SYNTHROID Take 50 mcg by mouth daily before breakfast.   melatonin 3 MG Tabs tablet Take 1 tablet by mouth at bedtime as needed.   methocarbamol 500 MG tablet Commonly known as: ROBAXIN Take 500 mg by mouth at bedtime.   metoprolol tartrate 25 MG tablet Commonly known as: LOPRESSOR Take 1 tablet (25 mg total) by mouth in the morning and at bedtime.   pantoprazole 40 MG tablet Commonly known as: PROTONIX Take 1 tablet (40 mg total) by mouth 2 (two) times daily.   polyethylene glycol powder 17 GM/SCOOP powder Commonly known as: GLYCOLAX/MIRALAX Take 17 g by mouth daily.   senna-docusate 8.6-50 MG tablet Commonly known as: Senokot-S Take 2 tablets by mouth 2 (two) times daily as needed for mild constipation.   spironolactone 25 MG tablet Commonly known as: ALDACTONE Take 25 mg by mouth daily.   SYSTANE OP Place 1 drop into both eyes 2 (two) times daily.   thiamine 100 MG tablet Take 1 tablet by mouth daily.   traZODone 50 MG tablet Commonly known as: DESYREL Take 50 mg by mouth at  bedtime.        Allergies  Allergen Reactions   Ace Inhibitors Cough   Levofloxacin    Penicillin G Other (See Comments)   Tape Other (See Comments)    Other reaction(s): Other (See Comments)   Amoxicillin Rash    Has patient had a PCN reaction causing immediate rash, facial/tongue/throat swelling, SOB or lightheadedness with hypotension: No Has patient had a PCN reaction causing severe rash involving mucus membranes or skin necrosis: No Has patient had a PCN reaction that required hospitalization: No Has patient had a PCN reaction occurring within the last 10 years: No If all of the above answers are "NO", then may proceed with Cephalosporin use.    Benicar [Olmesartan] Rash    Itching rash   Codeine Rash   Codeine Sulfate Rash   Levaquin [Levofloxacin In D5w] Rash    Alters mental status   Penicillin V Potassium Rash    Has patient had a PCN reaction causing immediate rash,  facial/tongue/throat swelling, SOB or lightheadedness with hypotension: No Has patient had a PCN reaction causing severe rash involving mucus membranes or skin necrosis: No Has patient had a PCN reaction that required hospitalization: No Has patient had a PCN reaction occurring within the last 10 years: No If all of the above answers are "NO", then may proceed with Cephalosporin use.    Sulfa Antibiotics Rash    Consultations: Neuro    Procedures/Studies: CT HEAD WO CONTRAST (5MM)  Result Date: 11/21/2021 CLINICAL DATA:  Mental status change, persistent or worsening. Seen here yesterday for hypertension. Severe headache. EXAM: CT HEAD WITHOUT CONTRAST TECHNIQUE: Contiguous axial images were obtained from the base of the skull through the vertex without intravenous contrast. COMPARISON:  11/20/2021 FINDINGS: Brain: No evidence of acute infarction, hemorrhage, hydrocephalus, extra-axial collection or mass lesion/mass effect. Mild cerebral atrophy. Low-attenuation changes in the deep white matter most likely representing small vessel ischemia. Basal ganglia calcifications. No change since prior study. Vascular: Moderate intracranial arterial vascular calcifications. Skull: Calvarium appears intact. Sinuses/Orbits: Paranasal sinuses and mastoid air cells are clear. Other: No significant change since prior study. IMPRESSION: No acute intracranial abnormalities. Mild chronic atrophy and small vessel ischemic changes. Electronically Signed   By: Lucienne Capers M.D.   On: 11/21/2021 15:36   CT HEAD WO CONTRAST (5MM)  Result Date: 11/20/2021 CLINICAL DATA:  Altered mental status. EXAM: CT HEAD WITHOUT CONTRAST TECHNIQUE: Contiguous axial images were obtained from the base of the skull through the vertex without intravenous contrast. COMPARISON:  Head CT dated 11/11/2021. FINDINGS: Brain: The ventricles and sulci appropriate size for patient's age. Mild periventricular and deep white matter chronic  microvascular ischemic changes noted. There is no acute intracranial hemorrhage. No mass effect or midline shift. No extra-axial fluid collection. Vascular: No hyperdense vessel or unexpected calcification. Skull: Normal. Negative for fracture or focal lesion. Sinuses/Orbits: No acute finding. Other: None IMPRESSION: 1. No acute intracranial pathology. 2. Mild chronic microvascular ischemic changes. Electronically Signed   By: Anner Crete M.D.   On: 11/20/2021 21:19   CT Head Wo Contrast  Result Date: 11/11/2021 CLINICAL DATA:  Weakness.  Mental status changes. EXAM: CT HEAD WITHOUT CONTRAST TECHNIQUE: Contiguous axial images were obtained from the base of the skull through the vertex without intravenous contrast. COMPARISON:  08/17/2021 FINDINGS: Brain: No mass lesion, hemorrhage, hydrocephalus, acute infarct, intra-axial, or extra-axial fluid collection. Vascular: No hyperdense vessel or unexpected calcification. Intracranial atherosclerosis. Skull: No significant soft tissue swelling.  No skull fracture. Sinuses/Orbits:  Normal imaged portions of the orbits and globes. Clear paranasal sinuses and mastoid air cells. Other: None. IMPRESSION: No acute intracranial abnormality. Electronically Signed   By: Abigail Miyamoto M.D.   On: 11/11/2021 12:55   MR BRAIN WO CONTRAST  Result Date: 12/02/2021 CLINICAL DATA:  Initial evaluation for neuro deficit, stroke suspected. EXAM: MRI HEAD WITHOUT CONTRAST TECHNIQUE: Multiplanar, multiecho pulse sequences of the brain and surrounding structures were obtained without intravenous contrast. COMPARISON:  Prior CTs from earlier the same day. FINDINGS: Brain: Cerebral volume within normal limits. Minimal chronic microvascular ischemic disease noted involving the periventricular white matter. Small remote cortical/subcortical infarct noted at the parasagittal left parieto-occipital region. Remote lacunar infarct present at the left thalamus. No abnormal foci of restricted  diffusion to suggest acute or subacute ischemia. Gray-white matter differentiation maintained. No other areas of chronic cortical infarction. No evidence for acute intracranial hemorrhage. Single chronic microhemorrhage noted at the anterior left frontal lobe, of doubtful significance in isolation. No mass lesion, midline shift or mass effect. No hydrocephalus or extra-axial fluid collection. Pituitary gland suprasellar region normal. Vascular: Major intracranial vascular flow voids are maintained. Skull and upper cervical spine: Craniocervical junction within normal limits. Bone marrow signal intensity normal. No scalp soft tissue abnormality. Sinuses/Orbits: Patient status post bilateral ocular lens replacement. Globes and orbital soft tissues demonstrate no acute finding. Paranasal sinuses are largely clear. No significant mastoid effusion. Other: None. IMPRESSION: 1. No acute intracranial abnormality. 2. 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 3. Underlying minimal for age chronic microvascular ischemic disease. Electronically Signed   By: Jeannine Boga M.D.   On: 12/02/2021 23:51   DG Chest Port 1 View  Result Date: 12/02/2021 CLINICAL DATA:  Weakness, fatigue EXAM: PORTABLE CHEST 1 VIEW COMPARISON:  11/21/2021 FINDINGS: Stable cardiomediastinal contours. Aortic atherosclerosis. No focal airspace consolidation, pleural effusion, or pneumothorax. Degenerative changes of the shoulders, left worse than right. IMPRESSION: No active disease. Electronically Signed   By: Davina Poke D.O.   On: 12/02/2021 16:18   DG Chest Port 1 View  Result Date: 11/21/2021 CLINICAL DATA:  81 year old female with a history chest pain EXAM: PORTABLE CHEST 1 VIEW COMPARISON:  11/20/2021 FINDINGS: Cardiomediastinal silhouette unchanged in size and contour. Calcifications of the aortic arch. No evidence of central vascular  congestion. No interlobular septal thickening. Lung volumes remain low. Coarsened interstitial markings with no new confluent airspace disease. Similar appearance of asymmetric elevation of the right hemidiaphragm. No pneumothorax or pleural effusion. No acute displaced fracture. Degenerative changes of the spine. IMPRESSION: Negative for acute cardiopulmonary disease. Electronically Signed   By: Corrie Mckusick D.O.   On: 11/21/2021 15:53   DG Chest Portable 1 View  Result Date: 11/20/2021 CLINICAL DATA:  Hypertension EXAM: PORTABLE CHEST 1 VIEW COMPARISON:  11/11/2021 FINDINGS: Shallow inspiration with low lung volumes. Mild bibasilar atelectasis/scarring. No pleural effusion. No pneumothorax. IMPRESSION: Mild bibasilar atelectasis/scarring. Electronically Signed   By: Macy Mis M.D.   On: 11/20/2021 16:17   DG Chest Portable 1 View  Result Date: 11/11/2021 CLINICAL DATA:  Weakness, altered mental status, recent knee surgery EXAM: PORTABLE CHEST 1 VIEW COMPARISON:  Portable exam 1201 hours compared to 08/29/2021 FINDINGS: Normal heart size, mediastinal contours, and pulmonary vascularity. Atherosclerotic calcification aorta. Minimal atelectasis LEFT base. No definite infiltrate, pleural effusion, or pneumothorax. Bones demineralized IMPRESSION: Minimal LEFT basilar atelectasis. Aortic Atherosclerosis (ICD10-I70.0). Electronically Signed   By: Lavonia Dana M.D.   On: 11/11/2021 12:13  ECHOCARDIOGRAM COMPLETE  Result Date: 12/03/2021    ECHOCARDIOGRAM REPORT   Patient Name:   Erin Sutton Date of Exam: 12/03/2021 Medical Rec #:  [id number]       Height:       63.0 in Accession #:    [id number]      Weight:       144.0 lb Date of Birth:  11-19-1941       BSA:          1.681 m Patient Age:    81 years        BP:           124/101 mmHg Patient Gender: F               HR:           75 bpm. Exam Location:  [facility] Procedure: 2D Echo, Cardiac Doppler and Color Doppler Indications:     Stroke I63.9  History:          Patient has prior history of Echocardiogram examinations, most                  recent 02/28/2021. Risk Factors:Hypertension. CKD, history of DVT                  and pulmonary embolism.  Sonographer:     Sherrie Sport Referring Phys:  [id number] AMY N COX Diagnosing Phys: Donnelly Angelica  Sonographer Comments: Suboptimal apical window. IMPRESSIONS  1. Left ventricular ejection fraction, by estimation, is 60 to 65%. The left ventricle has normal function. The left ventricle has no regional wall motion abnormalities. Left ventricular diastolic parameters were normal.  2. Right ventricular systolic function is normal. The right ventricular size is normal.  3. The mitral valve is normal in structure. No evidence of mitral valve regurgitation. No evidence of mitral stenosis.  4. The aortic valve is normal in structure. Aortic valve regurgitation is not visualized. No aortic stenosis is present.  5. The inferior vena cava is normal in size with greater than 50% respiratory variability, suggesting right atrial pressure of 3 mmHg. FINDINGS  Left Ventricle: Left ventricular ejection fraction, by estimation, is 60 to 65%. The left ventricle has normal function. The left ventricle has no regional wall motion abnormalities. The left ventricular internal cavity size was normal in size. There is  no left ventricular hypertrophy. Left ventricular diastolic parameters were normal. Right Ventricle: The right ventricular size is normal. No increase in right ventricular wall thickness. Right ventricular systolic function is normal. Left Atrium: Left atrial size was normal in size. Right Atrium: Right atrial size was normal in size. Pericardium: There is no evidence of pericardial effusion. Mitral Valve: The mitral valve is normal in structure. No evidence of mitral valve regurgitation. No evidence of mitral valve stenosis. MV peak gradient, 6.2 mmHg. The mean mitral valve gradient is 2.0 mmHg. Tricuspid Valve: The tricuspid valve is  normal in structure. Tricuspid valve regurgitation is not demonstrated. No evidence of tricuspid stenosis. Aortic Valve: The aortic valve is normal in structure. Aortic valve regurgitation is not visualized. No aortic stenosis is present. Aortic valve mean gradient measures 6.0 mmHg. Aortic valve peak gradient measures 9.1 mmHg. Aortic valve area, by VTI measures 3.13 cm. Pulmonic Valve: The pulmonic valve was normal in structure. Pulmonic valve regurgitation is not visualized. No evidence of pulmonic stenosis. Aorta: The aortic root is normal in size and structure. Venous: The inferior vena cava is normal in size with greater  than 50% respiratory variability, suggesting right atrial pressure of 3 mmHg. IAS/Shunts: No atrial level shunt detected by color flow Doppler.  LEFT VENTRICLE PLAX 2D LVIDd:         3.60 cm   Diastology LVIDs:         2.20 cm   LV e' medial:    5.44 cm/s LV PW:         1.10 cm   LV E/e' medial:  8.9 LV IVS:        0.80 cm   LV e' lateral:   6.42 cm/s LVOT diam:     2.00 cm   LV E/e' lateral: 7.5 LV SV:         84 LV SV Index:   50 LVOT Area:     3.14 cm  RIGHT VENTRICLE RV Basal diam:  2.30 cm RV S prime:     8.92 cm/s TAPSE (M-mode): 2.2 cm LEFT ATRIUM             Index        RIGHT ATRIUM           Index LA diam:        3.90 cm 2.32 cm/m   RA Area:     11.70 cm LA Vol (A2C):   38.5 ml 22.90 ml/m  RA Volume:   26.10 ml  15.52 ml/m LA Vol (A4C):   42.1 ml 25.04 ml/m LA Biplane Vol: 41.4 ml 24.62 ml/m  AORTIC VALVE                     PULMONIC VALVE AV Area (Vmax):    2.52 cm      PV Vmax:        1.23 m/s AV Area (Vmean):   2.68 cm      PV Vmean:       77.600 cm/s AV Area (VTI):     3.13 cm      PV VTI:         0.215 m AV Vmax:           151.00 cm/s   PV Peak grad:   6.1 mmHg AV Vmean:          113.000 cm/s  PV Mean grad:   3.0 mmHg AV VTI:            0.268 m       RVOT Peak grad: 7 mmHg AV Peak Grad:      9.1 mmHg AV Mean Grad:      6.0 mmHg LVOT Vmax:         121.00 cm/s LVOT  Vmean:        96.500 cm/s LVOT VTI:          0.267 m LVOT/AV VTI ratio: 1.00  AORTA Ao Root diam: 2.90 cm MITRAL VALVE               TRICUSPID VALVE MV Area (PHT): 2.32 cm    TR Peak grad:   15.2 mmHg MV Area VTI:   4.07 cm    TR Vmax:        195.00 cm/s MV Peak grad:  6.2 mmHg MV Mean grad:  2.0 mmHg    SHUNTS MV Vmax:       1.25 m/s    Systemic VTI:  0.27 m MV Vmean:      65.0 cm/s   Systemic Diam: 2.00 cm MV Decel Time: 327 msec  Pulmonic VTI:  0.222 m MV E velocity: 48.40 cm/s MV A velocity: 83.60 cm/s MV E/A ratio:  0.58 Donnelly Angelica Electronically signed by Donnelly Angelica Signature Date/Time: 12/03/2021/1:25:48 PM    Final    CT HEAD CODE STROKE WO CONTRAST  Result Date: 12/08/2021 CLINICAL DATA:  Code stroke.  Facial droop with slurred speech EXAM: CT HEAD WITHOUT CONTRAST TECHNIQUE: Contiguous axial images were obtained from the base of the skull through the vertex without intravenous contrast. RADIATION DOSE REDUCTION: This exam was performed according to the departmental dose-optimization program which includes automated exposure control, adjustment of the mA and/or kV according to patient size and/or use of iterative reconstruction technique. COMPARISON:  Brain MRI from 6 days ago FINDINGS: Brain: No evidence of acute infarction, hemorrhage, hydrocephalus, extra-axial collection or mass lesion/mass effect. 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 Vascular: No hyperdense vessel or unexpected calcification. Skull: Normal. Negative for fracture or focal lesion. Sinuses/Orbits: No acute finding. Other: These results were communicated to Dr. Theda Sers at 10:00 am on 12/08/2021 by text page via the [facility] messaging system. ASPECTS [facility] Stroke Program Early CT Score) - Ganglionic level infarction (caudate, lentiform nuclei, internal capsule, insula, M1-M3 cortex): 7 - Supraganglionic infarction (M4-M6 cortex): 3 Total score (0-10 with 10 being normal): 10 IMPRESSION: 1. No acute or interval  finding. 2. ASPECTS is 10 Electronically Signed   By: Jorje Guild M.D.   On: 12/08/2021 10:00   CT HEAD CODE STROKE WO CONTRAST  Result Date: 12/02/2021 CLINICAL DATA:  Code stroke.  Weakness and fatigue EXAM: CT HEAD WITHOUT CONTRAST TECHNIQUE: Contiguous axial images were obtained from the base of the skull through the vertex without intravenous contrast. COMPARISON:  11/21/2021. FINDINGS: Brain: No evidence of acute infarction, hemorrhage, cerebral edema, mass, mass effect, or midline shift. Ventricles and sulci are normal for age. No extra-axial fluid collection. Periventricular white matter changes, likely the sequela of chronic small vessel ischemic disease. Basal ganglia calcifications. Vascular: No hyperdense vessel or unexpected calcification. Skull: Normal. Negative for fracture or focal lesion. Sinuses/Orbits: No acute finding. Other: The mastoid air cells are well aerated. ASPECTS [facility] Stroke Program Early CT Score) - Ganglionic level infarction (caudate, lentiform nuclei, internal capsule, insula, M1-M3 cortex): 7 - Supraganglionic infarction (M4-M6 cortex): 3 Total score (0-10 with 10 being normal): 10 IMPRESSION: 1. No acute intracranial process. 2. ASPECTS is 10. Code stroke imaging results were communicated on 12/02/2021 at 3:40 pm to provider Dr. Theda Sers via secure text paging. Electronically Signed   By: Merilyn Baba M.D.   On: 12/02/2021 15:41   CT ANGIO HEAD NECK W WO CM (CODE STROKE)  Result Date: 12/02/2021 CLINICAL DATA:  Weakness and fatigue EXAM: CT ANGIOGRAPHY HEAD AND NECK TECHNIQUE: Multidetector CT imaging of the head and neck was performed using the standard protocol during bolus administration of intravenous contrast. Multiplanar CT image reconstructions and MIPs were obtained to evaluate the vascular anatomy. Carotid stenosis measurements (when applicable) are obtained utilizing NASCET criteria, using the distal internal carotid diameter as the denominator. CONTRAST:   34mL OMNIPAQUE IOHEXOL 350 MG/ML SOLN COMPARISON:  08/12/2021 CTA head neck, correlation is made with same day CT head. FINDINGS: CT HEAD FINDINGS For noncontrast findings, please see same day CT head. CTA NECK FINDINGS Aortic arch: Standard branching. Imaged portion shows no evidence of aneurysm or dissection. No significant stenosis of the major arch vessel origins. Aortic atherosclerosis, which extends into the branch vessels. Right carotid system: Calcified plaque at the bifurcation  and in the proximal right ICA, which causes less than 50% stenosis. No dissection or occlusion. Left carotid system: No dissection or occlusion. Minimal calcified plaque at bifurcation, without significant stenosis. Vertebral arteries: Codominant. No evidence of dissection, stenosis (50% or greater) or occlusion. Skeleton: Degenerative changes in the cervical spine. No acute osseous abnormality. Other neck: Prior left thyroidectomy.  Otherwise negative. Upper chest: Focal pulmonary opacity or pleural effusion. Review of the MIP images confirms the above findings CTA HEAD FINDINGS Anterior circulation: Both internal carotid arteries are patent to the termini, with moderate calcifications but without significant stenosis. A1 segments patent. Normal anterior communicating artery. Anterior cerebral arteries are patent to their distal aspects. No M1 stenosis or occlusion. Normal MCA bifurcations. Moderate stenosis again noted in a left M2 branch. Distal MCA branches perfused, although there is mildly more robust perfusion on the right compared to the left, without additional focal stenosis. Posterior circulation: Vertebral arteries patent to the vertebrobasilar junction without stenosis. Basilar patent to its distal aspect. Superior cerebellar arteries patent bilaterally. Bilateral P1 segments originate from the basilar artery, although the right posterior communicating artery is present. PCAs perfused to their distal aspects without  stenosis. The left posterior communicating artery is not visualized. Venous sinuses: As permitted by contrast timing, patent. Anatomic variants: None significant Review of the MIP images confirms the above findings IMPRESSION: 1.  No intracranial large vessel occlusion or significant stenosis. 2.  No hemodynamically significant stenosis in the neck. 3. 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 Code stroke imaging results were communicated on 12/02/2021 at 4:05 pm to provider Dr. Theda Sers via secure text paging. Electronically Signed   By: Merilyn Baba M.D.   On: 12/02/2021 16:05   (Echo, Carotid, EGD, Colonoscopy, ERCP)    Subjective: Pt c/o fatigue    Discharge Exam: Vitals:   12/10/21 1018 12/10/21 1248  BP: 90/60 108/64  Pulse: 83 73  Resp: 17 18  Temp: 98.3 F (36.8 C) 98.6 F (37 C)  SpO2: 95% 90%   Vitals:   12/09/21 2324 12/10/21 0515 12/10/21 1018 12/10/21 1248  BP: (!) 95/55 (!) 93/49 90/60 108/64  Pulse: 67 65 83 73  Resp:   17 18  Temp: 98.3 F (36.8 C) 98.2 F (36.8 C) 98.3 F (36.8 C) 98.6 F (37 C)  TempSrc: Oral Oral Oral   SpO2: 96% 94% 95% 90%  Weight:      Height:        General: Pt is alert, awake, not in acute distress Cardiovascular:  S1/S2 +, no rubs, no gallops Respiratory: CTA bilaterally, no wheezing, no rhonchi Abdominal: Soft, NT, ND, bowel sounds + Extremities: no edema, no cyanosis    The results of significant diagnostics from this hospitalization (including imaging, microbiology, ancillary and laboratory) are listed below for reference.     Microbiology: Recent Results (from the past 240 hour(s))  Culture, blood (Routine x 2)     Status: None   Collection Time: 12/02/21  3:50 PM   Specimen: BLOOD  Result Value Ref Range Status   Specimen Description BLOOD LAC  Final   Special Requests BOTTLES DRAWN AEROBIC AND ANAEROBIC BCAV  Final   Culture   Final    NO GROWTH 5 DAYS Performed at [facility],  [address] [place], [place] [address]    Report Status 12/07/2021 FINAL  Final  Resp Panel by RT-PCR (Flu A&B, Covid) Nasopharyngeal Swab     Status: None   Collection Time:  12/02/21  3:51 PM   Specimen: Nasopharyngeal Swab; Nasopharyngeal(NP) swabs in vial transport medium  Result Value Ref Range Status   SARS Coronavirus 2 by RT PCR NEGATIVE NEGATIVE Final    Comment: (NOTE) SARS-CoV-2 target nucleic acids are NOT DETECTED.  The SARS-CoV-2 RNA is generally detectable in upper respiratory specimens during the acute phase of infection. The lowest concentration of SARS-CoV-2 viral copies this assay can detect is 138 copies/mL. A negative result does not preclude SARS-Cov-2 infection and should not be used as the sole basis for treatment or other patient management decisions. A negative result may occur with  improper specimen collection/handling, submission of specimen other than nasopharyngeal swab, presence of viral mutation(s) within the areas targeted by this assay, and inadequate number of viral copies(<138 copies/mL). A negative result must be combined with clinical observations, patient history, and epidemiological information. The expected result is Negative.  Fact Sheet for Patients:  EntrepreneurPulse.com.au  Fact Sheet for Healthcare Providers:  IncredibleEmployment.be  This test is no t yet approved or cleared by the [place] FDA and  has been authorized for detection and/or diagnosis of SARS-CoV-2 by FDA under an Emergency Use Authorization (EUA). This EUA will remain  in effect (meaning this test can be used) for the duration of the COVID-19 declaration under Section 564(b)(1) of the Act, 21 U.S.C.section 360bbb-3(b)(1), unless the authorization is terminated  or revoked sooner.       Influenza A by PCR NEGATIVE NEGATIVE Final   Influenza B by PCR NEGATIVE NEGATIVE Final    Comment: (NOTE) The Xpert Xpress  SARS-CoV-2/FLU/RSV plus assay is intended as an aid in the diagnosis of influenza from Nasopharyngeal swab specimens and should not be used as a sole basis for treatment. Nasal washings and aspirates are unacceptable for Xpert Xpress SARS-CoV-2/FLU/RSV testing.  Fact Sheet for Patients: EntrepreneurPulse.com.au  Fact Sheet for Healthcare Providers: IncredibleEmployment.be  This test is not yet approved or cleared by the [place] FDA and has been authorized for detection and/or diagnosis of SARS-CoV-2 by FDA under an Emergency Use Authorization (EUA). This EUA will remain in effect (meaning this test can be used) for the duration of the COVID-19 declaration under Section 564(b)(1) of the Act, 21 U.S.C. section 360bbb-3(b)(1), unless the authorization is terminated or revoked.  Performed at [facility], [address] [place], [place] [address]   Urine Culture     Status: Abnormal   Collection Time: 12/02/21  3:51 PM   Specimen: Urine, Clean Catch  Result Value Ref Range Status   Specimen Description   Final    URINE, CLEAN CATCH Performed at [facility], [address], [place], [place] [address]    Special Requests   Final    NONE Performed at [facility], [address] [place], [place] [address]    Culture MULTIPLE SPECIES PRESENT, SUGGEST RECOLLECTION (A)  Final   Report Status 12/03/2021 FINAL  Final  Culture, blood (Routine x 2)     Status: None   Collection Time: 12/02/21  4:00 PM   Specimen: BLOOD  Result Value Ref Range Status   Specimen Description BLOOD [facility]  Final   Special Requests BOTTLES DRAWN AEROBIC AND ANAEROBIC BCAV  Final   Culture   Final    NO GROWTH 5 DAYS Performed at [facility], [address], [place], [place] [address]    Report Status 12/07/2021 FINAL  Final  Resp Panel by RT-PCR (Flu A&B, Covid) Nasopharyngeal Swab  Status: None   Collection Time:  12/08/21 11:12 AM   Specimen: Nasopharyngeal Swab; Nasopharyngeal(NP) swabs in vial transport medium  Result Value Ref Range Status   SARS Coronavirus 2 by RT PCR NEGATIVE NEGATIVE Final    Comment: (NOTE) SARS-CoV-2 target nucleic acids are NOT DETECTED.  The SARS-CoV-2 RNA is generally detectable in upper respiratory specimens during the acute phase of infection. The lowest concentration of SARS-CoV-2 viral copies this assay can detect is 138 copies/mL. A negative result does not preclude SARS-Cov-2 infection and should not be used as the sole basis for treatment or other patient management decisions. A negative result may occur with  improper specimen collection/handling, submission of specimen other than nasopharyngeal swab, presence of viral mutation(s) within the areas targeted by this assay, and inadequate number of viral copies(<138 copies/mL). A negative result must be combined with clinical observations, patient history, and epidemiological information. The expected result is Negative.  Fact Sheet for Patients:  EntrepreneurPulse.com.au  Fact Sheet for Healthcare Providers:  IncredibleEmployment.be  This test is no t yet approved or cleared by the [place] FDA and  has been authorized for detection and/or diagnosis of SARS-CoV-2 by FDA under an Emergency Use Authorization (EUA). This EUA will remain  in effect (meaning this test can be used) for the duration of the COVID-19 declaration under Section 564(b)(1) of the Act, 21 U.S.C.section 360bbb-3(b)(1), unless the authorization is terminated  or revoked sooner.       Influenza A by PCR NEGATIVE NEGATIVE Final   Influenza B by PCR NEGATIVE NEGATIVE Final    Comment: (NOTE) The Xpert Xpress SARS-CoV-2/FLU/RSV plus assay is intended as an aid in the diagnosis of influenza from Nasopharyngeal swab specimens and should not be used as a sole basis for treatment. Nasal washings  and aspirates are unacceptable for Xpert Xpress SARS-CoV-2/FLU/RSV testing.  Fact Sheet for Patients: EntrepreneurPulse.com.au  Fact Sheet for Healthcare Providers: IncredibleEmployment.be  This test is not yet approved or cleared by the [place] FDA and has been authorized for detection and/or diagnosis of SARS-CoV-2 by FDA under an Emergency Use Authorization (EUA). This EUA will remain in effect (meaning this test can be used) for the duration of the COVID-19 declaration under Section 564(b)(1) of the Act, 21 U.S.C. section 360bbb-3(b)(1), unless the authorization is terminated or revoked.  Performed at [facility], [address] [place], [place] [address]      Labs: BNP (last 3 results) Recent Labs    06/29/21 1003  BNP 19.5   Basic Metabolic Panel: Recent Labs  Lab 12/04/21 0430 12/05/21 0525 12/06/21 0653 12/08/21 1003 12/10/21 0542  NA 137 139 135 136 137  K 4.1 4.2 4.1 3.9 4.2  CL 103 106 104 101 104  CO2 25 26 24 26 27   GLUCOSE 103* 97 89 103* 88  BUN 24* 24* 19 13 16   CREATININE 0.99 1.01* 0.82 0.82 1.03*  CALCIUM 9.4 8.9 8.9 10.0 9.3  MG 1.9 1.8 2.3  --   --    Liver Function Tests: Recent Labs  Lab 12/04/21 0431 12/08/21 1003  AST 18 33  ALT 9 16  ALKPHOS 39 59  BILITOT 0.6 0.6  PROT 6.1* 8.2*  ALBUMIN 3.6 4.5   Recent Labs  Lab 12/04/21 0430  LIPASE 40   No results for input(s): AMMONIA in the last 168 hours. CBC: Recent Labs  Lab 12/04/21 0430 12/05/21 0525 12/06/21 0653 12/08/21 1003 12/10/21 0542  WBC 6.1 6.7 6.9 6.4 6.2  NEUTROABS  --   --   --  4.2  --   HGB 10.9* 10.2* 10.0* 12.6 9.6*  HCT 33.8* 32.2* 31.5* 39.5 30.1*  MCV 88.9 91.7 91.3 90.4 90.9  PLT 263 234 227 283 246   Cardiac Enzymes: No results for input(s): CKTOTAL, CKMB, CKMBINDEX, TROPONINI in the last 168 hours. BNP: Invalid input(s): POCBNP CBG: Recent Labs  Lab 12/08/21 0944  GLUCAP 98    D-Dimer No results for input(s): DDIMER in the last 72 hours. Hgb A1c No results for input(s): HGBA1C in the last 72 hours. Lipid Profile No results for input(s): CHOL, HDL, LDLCALC, TRIG, CHOLHDL, LDLDIRECT in the last 72 hours. Thyroid function studies No results for input(s): TSH, T4TOTAL, T3FREE, THYROIDAB in the last 72 hours.  Invalid input(s): FREET3 Anemia work up No results for input(s): VITAMINB12, FOLATE, FERRITIN, TIBC, IRON, RETICCTPCT in the last 72 hours. Urinalysis    Component Value Date/Time   COLORURINE COLORLESS (A) 12/02/2021 1551   APPEARANCEUR CLEAR (A) 12/02/2021 1551   APPEARANCEUR Hazy 07/25/2014 1350   LABSPEC 1.019 12/02/2021 1551   LABSPEC 1.020 07/25/2014 1350   PHURINE 8.0 12/02/2021 1551   GLUCOSEU NEGATIVE 12/02/2021 1551   GLUCOSEU Negative 07/25/2014 1350   HGBUR SMALL (A) 12/02/2021 1551   BILIRUBINUR NEGATIVE 12/02/2021 1551   BILIRUBINUR Negative 07/25/2014 1350   KETONESUR 5 (A) 12/02/2021 1551   PROTEINUR 30 (A) 12/02/2021 1551   NITRITE NEGATIVE 12/02/2021 1551   LEUKOCYTESUR TRACE (A) 12/02/2021 1551   LEUKOCYTESUR 2+ 07/25/2014 1350   Sepsis Labs Invalid input(s): PROCALCITONIN,  WBC,  LACTICIDVEN Microbiology Recent Results (from the past 240 hour(s))  Culture, blood (Routine x 2)     Status: None   Collection Time: 12/02/21  3:50 PM   Specimen: BLOOD  Result Value Ref Range Status   Specimen Description BLOOD LAC  Final   Special Requests BOTTLES DRAWN AEROBIC AND ANAEROBIC BCAV  Final   Culture   Final    NO GROWTH 5 DAYS Performed at [facility], [address] [place], [place] [address]    Report Status 12/07/2021 FINAL  Final  Resp Panel by RT-PCR (Flu A&B, Covid) Nasopharyngeal Swab     Status: None   Collection Time: 12/02/21  3:51 PM   Specimen: Nasopharyngeal Swab; Nasopharyngeal(NP) swabs in vial transport medium  Result Value Ref Range Status   SARS Coronavirus 2 by RT PCR NEGATIVE NEGATIVE Final     Comment: (NOTE) SARS-CoV-2 target nucleic acids are NOT DETECTED.  The SARS-CoV-2 RNA is generally detectable in upper respiratory specimens during the acute phase of infection. The lowest concentration of SARS-CoV-2 viral copies this assay can detect is 138 copies/mL. A negative result does not preclude SARS-Cov-2 infection and should not be used as the sole basis for treatment or other patient management decisions. A negative result may occur with  improper specimen collection/handling, submission of specimen other than nasopharyngeal swab, presence of viral mutation(s) within the areas targeted by this assay, and inadequate number of viral copies(<138 copies/mL). A negative result must be combined with clinical observations, patient history, and epidemiological information. The expected result is Negative.  Fact Sheet for Patients:  EntrepreneurPulse.com.au  Fact Sheet for Healthcare Providers:  IncredibleEmployment.be  This test is no t yet approved or cleared by the [place] FDA and  has been authorized for detection and/or diagnosis of SARS-CoV-2 by FDA under an Emergency Use Authorization (EUA). This EUA will remain  in effect (meaning this test can be used) for the duration of the COVID-19 declaration under  Section 564(b)(1) of the Act, 21 U.S.C.section 360bbb-3(b)(1), unless the authorization is terminated  or revoked sooner.       Influenza A by PCR NEGATIVE NEGATIVE Final   Influenza B by PCR NEGATIVE NEGATIVE Final    Comment: (NOTE) The Xpert Xpress SARS-CoV-2/FLU/RSV plus assay is intended as an aid in the diagnosis of influenza from Nasopharyngeal swab specimens and should not be used as a sole basis for treatment. Nasal washings and aspirates are unacceptable for Xpert Xpress SARS-CoV-2/FLU/RSV testing.  Fact Sheet for Patients: EntrepreneurPulse.com.au  Fact Sheet for Healthcare  Providers: IncredibleEmployment.be  This test is not yet approved or cleared by the [place] FDA and has been authorized for detection and/or diagnosis of SARS-CoV-2 by FDA under an Emergency Use Authorization (EUA). This EUA will remain in effect (meaning this test can be used) for the duration of the COVID-19 declaration under Section 564(b)(1) of the Act, 21 U.S.C. section 360bbb-3(b)(1), unless the authorization is terminated or revoked.  Performed at [facility], [address], [place], [place] [address]   Urine Culture     Status: Abnormal   Collection Time: 12/02/21  3:51 PM   Specimen: Urine, Clean Catch  Result Value Ref Range Status   Specimen Description   Final    URINE, CLEAN CATCH Performed at [facility], [address], [place], [place] [address]    Special Requests   Final    NONE Performed at [facility], [address] [place], [place] [address]    Culture MULTIPLE SPECIES PRESENT, SUGGEST RECOLLECTION (A)  Final   Report Status 12/03/2021 FINAL  Final  Culture, blood (Routine x 2)     Status: None   Collection Time: 12/02/21  4:00 PM   Specimen: BLOOD  Result Value Ref Range Status   Specimen Description BLOOD [facility]  Final   Special Requests BOTTLES DRAWN AEROBIC AND ANAEROBIC BCAV  Final   Culture   Final    NO GROWTH 5 DAYS Performed at [facility], [address] [place], [place] [address]    Report Status 12/07/2021 FINAL  Final  Resp Panel by RT-PCR (Flu A&B, Covid) Nasopharyngeal Swab     Status: None   Collection Time: 12/08/21 11:12 AM   Specimen: Nasopharyngeal Swab; Nasopharyngeal(NP) swabs in vial transport medium  Result Value Ref Range Status   SARS Coronavirus 2 by RT PCR NEGATIVE NEGATIVE Final    Comment: (NOTE) SARS-CoV-2 target nucleic acids are NOT DETECTED.  The SARS-CoV-2 RNA is generally detectable in upper respiratory specimens during the acute phase of  infection. The lowest concentration of SARS-CoV-2 viral copies this assay can detect is 138 copies/mL. A negative result does not preclude SARS-Cov-2 infection and should not be used as the sole basis for treatment or other patient management decisions. A negative result may occur with  improper specimen collection/handling, submission of specimen other than nasopharyngeal swab, presence of viral mutation(s) within the areas targeted by this assay, and inadequate number of viral copies(<138 copies/mL). A negative result must be combined with clinical observations, patient history, and epidemiological information. The expected result is Negative.  Fact Sheet for Patients:  EntrepreneurPulse.com.au  Fact Sheet for Healthcare Providers:  IncredibleEmployment.be  This test is no t yet approved or cleared by the [place] FDA and  has been authorized for detection and/or diagnosis of SARS-CoV-2 by FDA under an Emergency Use Authorization (EUA). This EUA will remain  in effect (meaning this test can be used) for  the duration of the COVID-19 declaration under Section 564(b)(1) of the Act, 21 U.S.C.section 360bbb-3(b)(1), unless the authorization is terminated  or revoked sooner.       Influenza A by PCR NEGATIVE NEGATIVE Final   Influenza B by PCR NEGATIVE NEGATIVE Final    Comment: (NOTE) The Xpert Xpress SARS-CoV-2/FLU/RSV plus assay is intended as an aid in the diagnosis of influenza from Nasopharyngeal swab specimens and should not be used as a sole basis for treatment. Nasal washings and aspirates are unacceptable for Xpert Xpress SARS-CoV-2/FLU/RSV testing.  Fact Sheet for Patients: EntrepreneurPulse.com.au  Fact Sheet for Healthcare Providers: IncredibleEmployment.be  This test is not yet approved or cleared by the [place] FDA and has been authorized for detection and/or diagnosis of SARS-CoV-2  by FDA under an Emergency Use Authorization (EUA). This EUA will remain in effect (meaning this test can be used) for the duration of the COVID-19 declaration under Section 564(b)(1) of the Act, 21 U.S.C. section 360bbb-3(b)(1), unless the authorization is terminated or revoked.  Performed at [facility], [address], [place], [place] [address]      Time coordinating discharge: Over 30 minutes  SIGNED:   Wyvonnia Dusky, MD  [facility] 12/10/2021, 1:57 PM Pager   If 7PM-7AM, please contact night-coverage

## 2021-12-10 NOTE — Progress Notes (Signed)
Nutrition Brief Note  Patient identified on the Malnutrition Screening Tool (MST) Report  Wt Readings from Last 15 Encounters:  12/08/21 65.3 kg  12/02/21 65.3 kg  11/24/21 65.3 kg  11/20/21 65.3 kg  11/11/21 65.3 kg  08/29/21 68 kg  08/17/21 68.5 kg  08/14/21 68.2 kg  06/29/21 74.8 kg  05/12/21 72.1 kg  02/27/21 80 kg  04/13/20 72.6 kg  02/09/20 78 kg  02/02/20 78.1 kg  12/13/19 77.4 kg   Erin Sutton is a 81 y.o. 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  Pt admitted with TIA.   Reviewed I/O's: +1.2 L x 24 hours and +1.5 L since admission  Current diet order is regular, patient is consuming approximately 100% of meals at this time. Labs and medications reviewed.   No nutrition interventions warranted at this time. If nutrition issues arise, please consult RD.   Loistine Chance, RD, LDN, [place] Registered Dietitian II Certified Diabetes Care and Education Specialist Please refer to [facility] for RD and/or RD on-call/weekend/after hours pager

## 2021-12-11 DIAGNOSIS — M6281 Muscle weakness (generalized): Secondary | ICD-10-CM | POA: Diagnosis not present

## 2021-12-11 DIAGNOSIS — I482 Chronic atrial fibrillation, unspecified: Secondary | ICD-10-CM | POA: Diagnosis not present

## 2021-12-11 DIAGNOSIS — I10 Essential (primary) hypertension: Secondary | ICD-10-CM | POA: Diagnosis not present

## 2021-12-11 DIAGNOSIS — Z8673 Personal history of transient ischemic attack (TIA), and cerebral infarction without residual deficits: Secondary | ICD-10-CM | POA: Diagnosis not present

## 2021-12-12 DIAGNOSIS — Z8673 Personal history of transient ischemic attack (TIA), and cerebral infarction without residual deficits: Secondary | ICD-10-CM | POA: Diagnosis not present

## 2021-12-12 DIAGNOSIS — G9340 Encephalopathy, unspecified: Secondary | ICD-10-CM | POA: Diagnosis not present

## 2021-12-12 DIAGNOSIS — Z96651 Presence of right artificial knee joint: Secondary | ICD-10-CM | POA: Diagnosis not present

## 2021-12-12 DIAGNOSIS — G4733 Obstructive sleep apnea (adult) (pediatric): Secondary | ICD-10-CM | POA: Diagnosis not present

## 2021-12-12 DIAGNOSIS — I482 Chronic atrial fibrillation, unspecified: Secondary | ICD-10-CM | POA: Diagnosis not present

## 2021-12-12 DIAGNOSIS — M6281 Muscle weakness (generalized): Secondary | ICD-10-CM | POA: Diagnosis not present

## 2021-12-12 DIAGNOSIS — I10 Essential (primary) hypertension: Secondary | ICD-10-CM | POA: Diagnosis not present

## 2021-12-13 DIAGNOSIS — M6281 Muscle weakness (generalized): Secondary | ICD-10-CM | POA: Diagnosis not present

## 2021-12-13 DIAGNOSIS — I10 Essential (primary) hypertension: Secondary | ICD-10-CM | POA: Diagnosis not present

## 2021-12-13 DIAGNOSIS — I482 Chronic atrial fibrillation, unspecified: Secondary | ICD-10-CM | POA: Diagnosis not present

## 2021-12-13 DIAGNOSIS — Z8673 Personal history of transient ischemic attack (TIA), and cerebral infarction without residual deficits: Secondary | ICD-10-CM | POA: Diagnosis not present

## 2021-12-15 DIAGNOSIS — F0390 Unspecified dementia without behavioral disturbance: Secondary | ICD-10-CM | POA: Diagnosis not present

## 2021-12-15 DIAGNOSIS — G319 Degenerative disease of nervous system, unspecified: Secondary | ICD-10-CM | POA: Diagnosis not present

## 2021-12-15 DIAGNOSIS — I4891 Unspecified atrial fibrillation: Secondary | ICD-10-CM | POA: Diagnosis not present

## 2021-12-15 DIAGNOSIS — I130 Hypertensive heart and chronic kidney disease with heart failure and stage 1 through stage 4 chronic kidney disease, or unspecified chronic kidney disease: Secondary | ICD-10-CM | POA: Diagnosis not present

## 2021-12-15 DIAGNOSIS — I10 Essential (primary) hypertension: Secondary | ICD-10-CM | POA: Diagnosis not present

## 2021-12-15 DIAGNOSIS — F419 Anxiety disorder, unspecified: Secondary | ICD-10-CM | POA: Diagnosis not present

## 2021-12-15 DIAGNOSIS — N390 Urinary tract infection, site not specified: Secondary | ICD-10-CM | POA: Diagnosis not present

## 2021-12-15 DIAGNOSIS — R109 Unspecified abdominal pain: Secondary | ICD-10-CM | POA: Diagnosis not present

## 2021-12-15 DIAGNOSIS — R471 Dysarthria and anarthria: Secondary | ICD-10-CM | POA: Diagnosis not present

## 2021-12-15 DIAGNOSIS — R531 Weakness: Secondary | ICD-10-CM | POA: Diagnosis not present

## 2021-12-15 DIAGNOSIS — Z20822 Contact with and (suspected) exposure to covid-19: Secondary | ICD-10-CM | POA: Diagnosis not present

## 2021-12-15 DIAGNOSIS — F339 Major depressive disorder, recurrent, unspecified: Secondary | ICD-10-CM | POA: Diagnosis not present

## 2021-12-15 DIAGNOSIS — R29810 Facial weakness: Secondary | ICD-10-CM | POA: Diagnosis not present

## 2021-12-15 DIAGNOSIS — I110 Hypertensive heart disease with heart failure: Secondary | ICD-10-CM | POA: Diagnosis not present

## 2021-12-15 DIAGNOSIS — I674 Hypertensive encephalopathy: Secondary | ICD-10-CM | POA: Diagnosis not present

## 2021-12-15 DIAGNOSIS — M6281 Muscle weakness (generalized): Secondary | ICD-10-CM | POA: Diagnosis not present

## 2021-12-15 DIAGNOSIS — I480 Paroxysmal atrial fibrillation: Secondary | ICD-10-CM | POA: Diagnosis not present

## 2021-12-15 DIAGNOSIS — R4781 Slurred speech: Secondary | ICD-10-CM | POA: Diagnosis not present

## 2021-12-15 DIAGNOSIS — N766 Ulceration of vulva: Secondary | ICD-10-CM | POA: Diagnosis not present

## 2021-12-15 DIAGNOSIS — N189 Chronic kidney disease, unspecified: Secondary | ICD-10-CM | POA: Diagnosis not present

## 2021-12-15 DIAGNOSIS — I5030 Unspecified diastolic (congestive) heart failure: Secondary | ICD-10-CM | POA: Diagnosis not present

## 2021-12-15 DIAGNOSIS — I639 Cerebral infarction, unspecified: Secondary | ICD-10-CM | POA: Diagnosis not present

## 2021-12-15 DIAGNOSIS — I69322 Dysarthria following cerebral infarction: Secondary | ICD-10-CM | POA: Diagnosis not present

## 2021-12-15 DIAGNOSIS — I5032 Chronic diastolic (congestive) heart failure: Secondary | ICD-10-CM | POA: Diagnosis not present

## 2021-12-15 DIAGNOSIS — E785 Hyperlipidemia, unspecified: Secondary | ICD-10-CM | POA: Diagnosis not present

## 2021-12-15 DIAGNOSIS — E039 Hypothyroidism, unspecified: Secondary | ICD-10-CM | POA: Diagnosis not present

## 2021-12-15 DIAGNOSIS — I6521 Occlusion and stenosis of right carotid artery: Secondary | ICD-10-CM | POA: Diagnosis not present

## 2021-12-25 DIAGNOSIS — R410 Disorientation, unspecified: Secondary | ICD-10-CM | POA: Diagnosis not present

## 2021-12-25 DIAGNOSIS — G4489 Other headache syndrome: Secondary | ICD-10-CM | POA: Diagnosis not present

## 2021-12-25 DIAGNOSIS — R519 Headache, unspecified: Secondary | ICD-10-CM | POA: Diagnosis not present

## 2021-12-25 DIAGNOSIS — M810 Age-related osteoporosis without current pathological fracture: Secondary | ICD-10-CM | POA: Diagnosis not present

## 2021-12-25 DIAGNOSIS — E785 Hyperlipidemia, unspecified: Secondary | ICD-10-CM | POA: Diagnosis not present

## 2021-12-25 DIAGNOSIS — Z20822 Contact with and (suspected) exposure to covid-19: Secondary | ICD-10-CM | POA: Diagnosis not present

## 2021-12-25 DIAGNOSIS — I129 Hypertensive chronic kidney disease with stage 1 through stage 4 chronic kidney disease, or unspecified chronic kidney disease: Secondary | ICD-10-CM | POA: Diagnosis not present

## 2021-12-25 DIAGNOSIS — R531 Weakness: Secondary | ICD-10-CM | POA: Diagnosis not present

## 2021-12-25 DIAGNOSIS — F419 Anxiety disorder, unspecified: Secondary | ICD-10-CM | POA: Diagnosis not present

## 2021-12-25 DIAGNOSIS — Z743 Need for continuous supervision: Secondary | ICD-10-CM | POA: Diagnosis not present

## 2021-12-25 DIAGNOSIS — N189 Chronic kidney disease, unspecified: Secondary | ICD-10-CM | POA: Diagnosis not present

## 2021-12-25 DIAGNOSIS — I639 Cerebral infarction, unspecified: Secondary | ICD-10-CM | POA: Diagnosis not present

## 2021-12-25 DIAGNOSIS — I161 Hypertensive emergency: Secondary | ICD-10-CM | POA: Diagnosis not present

## 2021-12-25 DIAGNOSIS — I10 Essential (primary) hypertension: Secondary | ICD-10-CM | POA: Diagnosis not present

## 2021-12-25 DIAGNOSIS — I674 Hypertensive encephalopathy: Secondary | ICD-10-CM | POA: Diagnosis not present

## 2021-12-26 DIAGNOSIS — I674 Hypertensive encephalopathy: Secondary | ICD-10-CM | POA: Diagnosis not present

## 2021-12-27 DIAGNOSIS — I10 Essential (primary) hypertension: Secondary | ICD-10-CM | POA: Diagnosis not present

## 2021-12-27 DIAGNOSIS — Z09 Encounter for follow-up examination after completed treatment for conditions other than malignant neoplasm: Secondary | ICD-10-CM | POA: Diagnosis not present

## 2021-12-27 DIAGNOSIS — I480 Paroxysmal atrial fibrillation: Secondary | ICD-10-CM | POA: Diagnosis not present

## 2021-12-27 DIAGNOSIS — F419 Anxiety disorder, unspecified: Secondary | ICD-10-CM | POA: Diagnosis not present

## 2021-12-29 DIAGNOSIS — R0689 Other abnormalities of breathing: Secondary | ICD-10-CM | POA: Diagnosis not present

## 2021-12-29 DIAGNOSIS — N184 Chronic kidney disease, stage 4 (severe): Secondary | ICD-10-CM | POA: Diagnosis not present

## 2021-12-29 DIAGNOSIS — R404 Transient alteration of awareness: Secondary | ICD-10-CM | POA: Diagnosis not present

## 2021-12-29 DIAGNOSIS — G4489 Other headache syndrome: Secondary | ICD-10-CM | POA: Diagnosis not present

## 2021-12-29 DIAGNOSIS — I161 Hypertensive emergency: Secondary | ICD-10-CM | POA: Diagnosis not present

## 2021-12-29 DIAGNOSIS — I674 Hypertensive encephalopathy: Secondary | ICD-10-CM | POA: Diagnosis not present

## 2021-12-29 DIAGNOSIS — K219 Gastro-esophageal reflux disease without esophagitis: Secondary | ICD-10-CM | POA: Diagnosis not present

## 2021-12-29 DIAGNOSIS — I82402 Acute embolism and thrombosis of unspecified deep veins of left lower extremity: Secondary | ICD-10-CM | POA: Diagnosis not present

## 2021-12-29 DIAGNOSIS — G9349 Other encephalopathy: Secondary | ICD-10-CM | POA: Diagnosis not present

## 2021-12-29 DIAGNOSIS — E039 Hypothyroidism, unspecified: Secondary | ICD-10-CM | POA: Diagnosis not present

## 2021-12-29 DIAGNOSIS — F064 Anxiety disorder due to known physiological condition: Secondary | ICD-10-CM | POA: Diagnosis not present

## 2021-12-29 DIAGNOSIS — I129 Hypertensive chronic kidney disease with stage 1 through stage 4 chronic kidney disease, or unspecified chronic kidney disease: Secondary | ICD-10-CM | POA: Diagnosis not present

## 2021-12-29 DIAGNOSIS — F05 Delirium due to known physiological condition: Secondary | ICD-10-CM | POA: Diagnosis not present

## 2021-12-29 DIAGNOSIS — R54 Age-related physical debility: Secondary | ICD-10-CM | POA: Diagnosis not present

## 2021-12-29 DIAGNOSIS — R258 Other abnormal involuntary movements: Secondary | ICD-10-CM | POA: Diagnosis not present

## 2021-12-29 DIAGNOSIS — R4182 Altered mental status, unspecified: Secondary | ICD-10-CM | POA: Diagnosis not present

## 2021-12-29 DIAGNOSIS — F411 Generalized anxiety disorder: Secondary | ICD-10-CM | POA: Diagnosis not present

## 2021-12-29 DIAGNOSIS — R29818 Other symptoms and signs involving the nervous system: Secondary | ICD-10-CM | POA: Diagnosis not present

## 2021-12-29 DIAGNOSIS — F339 Major depressive disorder, recurrent, unspecified: Secondary | ICD-10-CM | POA: Diagnosis not present

## 2021-12-29 DIAGNOSIS — F0284 Dementia in other diseases classified elsewhere, unspecified severity, with anxiety: Secondary | ICD-10-CM | POA: Diagnosis not present

## 2021-12-29 DIAGNOSIS — I272 Pulmonary hypertension, unspecified: Secondary | ICD-10-CM | POA: Diagnosis not present

## 2021-12-29 DIAGNOSIS — R4701 Aphasia: Secondary | ICD-10-CM | POA: Diagnosis not present

## 2021-12-29 DIAGNOSIS — D696 Thrombocytopenia, unspecified: Secondary | ICD-10-CM | POA: Diagnosis not present

## 2021-12-29 DIAGNOSIS — E873 Alkalosis: Secondary | ICD-10-CM | POA: Diagnosis not present

## 2021-12-29 DIAGNOSIS — R0902 Hypoxemia: Secondary | ICD-10-CM | POA: Diagnosis not present

## 2021-12-29 DIAGNOSIS — I10 Essential (primary) hypertension: Secondary | ICD-10-CM | POA: Diagnosis not present

## 2021-12-29 DIAGNOSIS — F0280 Dementia in other diseases classified elsewhere without behavioral disturbance: Secondary | ICD-10-CM | POA: Diagnosis not present

## 2021-12-29 DIAGNOSIS — R29810 Facial weakness: Secondary | ICD-10-CM | POA: Diagnosis not present

## 2021-12-29 DIAGNOSIS — F331 Major depressive disorder, recurrent, moderate: Secondary | ICD-10-CM | POA: Diagnosis not present

## 2021-12-29 DIAGNOSIS — U071 COVID-19: Secondary | ICD-10-CM | POA: Diagnosis not present

## 2021-12-29 DIAGNOSIS — I480 Paroxysmal atrial fibrillation: Secondary | ICD-10-CM | POA: Diagnosis not present

## 2022-01-01 DIAGNOSIS — I674 Hypertensive encephalopathy: Secondary | ICD-10-CM | POA: Diagnosis not present

## 2022-01-06 DIAGNOSIS — I674 Hypertensive encephalopathy: Secondary | ICD-10-CM | POA: Diagnosis not present

## 2022-01-06 DIAGNOSIS — I10 Essential (primary) hypertension: Secondary | ICD-10-CM | POA: Diagnosis not present

## 2022-01-06 DIAGNOSIS — F331 Major depressive disorder, recurrent, moderate: Secondary | ICD-10-CM | POA: Diagnosis not present

## 2022-01-06 DIAGNOSIS — F0280 Dementia in other diseases classified elsewhere without behavioral disturbance: Secondary | ICD-10-CM | POA: Diagnosis not present

## 2022-01-06 DIAGNOSIS — I480 Paroxysmal atrial fibrillation: Secondary | ICD-10-CM | POA: Diagnosis not present

## 2022-01-06 DIAGNOSIS — E039 Hypothyroidism, unspecified: Secondary | ICD-10-CM | POA: Diagnosis not present

## 2022-01-06 DIAGNOSIS — R404 Transient alteration of awareness: Secondary | ICD-10-CM | POA: Diagnosis not present

## 2022-01-08 DIAGNOSIS — E039 Hypothyroidism, unspecified: Secondary | ICD-10-CM | POA: Diagnosis not present

## 2022-01-08 DIAGNOSIS — N184 Chronic kidney disease, stage 4 (severe): Secondary | ICD-10-CM | POA: Diagnosis not present

## 2022-01-08 DIAGNOSIS — I480 Paroxysmal atrial fibrillation: Secondary | ICD-10-CM | POA: Diagnosis not present

## 2022-01-08 DIAGNOSIS — F064 Anxiety disorder due to known physiological condition: Secondary | ICD-10-CM | POA: Diagnosis not present

## 2022-01-08 DIAGNOSIS — F411 Generalized anxiety disorder: Secondary | ICD-10-CM | POA: Diagnosis not present

## 2022-01-08 DIAGNOSIS — F0284 Dementia in other diseases classified elsewhere, unspecified severity, with anxiety: Secondary | ICD-10-CM | POA: Diagnosis not present

## 2022-01-08 DIAGNOSIS — I129 Hypertensive chronic kidney disease with stage 1 through stage 4 chronic kidney disease, or unspecified chronic kidney disease: Secondary | ICD-10-CM | POA: Diagnosis not present

## 2022-01-12 DIAGNOSIS — R42 Dizziness and giddiness: Secondary | ICD-10-CM | POA: Diagnosis not present

## 2022-01-12 DIAGNOSIS — M799 Soft tissue disorder, unspecified: Secondary | ICD-10-CM | POA: Diagnosis not present

## 2022-01-12 DIAGNOSIS — G4733 Obstructive sleep apnea (adult) (pediatric): Secondary | ICD-10-CM | POA: Diagnosis not present

## 2022-01-12 DIAGNOSIS — E785 Hyperlipidemia, unspecified: Secondary | ICD-10-CM | POA: Diagnosis not present

## 2022-01-12 DIAGNOSIS — M6281 Muscle weakness (generalized): Secondary | ICD-10-CM | POA: Diagnosis not present

## 2022-01-12 DIAGNOSIS — R000 Tachycardia, unspecified: Secondary | ICD-10-CM | POA: Diagnosis not present

## 2022-01-12 DIAGNOSIS — I480 Paroxysmal atrial fibrillation: Secondary | ICD-10-CM | POA: Diagnosis not present

## 2022-01-12 DIAGNOSIS — L89893 Pressure ulcer of other site, stage 3: Secondary | ICD-10-CM | POA: Diagnosis not present

## 2022-01-12 DIAGNOSIS — F419 Anxiety disorder, unspecified: Secondary | ICD-10-CM | POA: Diagnosis not present

## 2022-01-12 DIAGNOSIS — I161 Hypertensive emergency: Secondary | ICD-10-CM | POA: Diagnosis not present

## 2022-01-12 DIAGNOSIS — U071 COVID-19: Secondary | ICD-10-CM | POA: Diagnosis not present

## 2022-01-12 DIAGNOSIS — N1831 Chronic kidney disease, stage 3a: Secondary | ICD-10-CM | POA: Diagnosis not present

## 2022-01-12 DIAGNOSIS — N189 Chronic kidney disease, unspecified: Secondary | ICD-10-CM | POA: Diagnosis not present

## 2022-01-12 DIAGNOSIS — I6783 Posterior reversible encephalopathy syndrome: Secondary | ICD-10-CM | POA: Diagnosis not present

## 2022-01-12 DIAGNOSIS — R404 Transient alteration of awareness: Secondary | ICD-10-CM | POA: Diagnosis not present

## 2022-01-12 DIAGNOSIS — I2510 Atherosclerotic heart disease of native coronary artery without angina pectoris: Secondary | ICD-10-CM | POA: Diagnosis not present

## 2022-01-12 DIAGNOSIS — G909 Disorder of the autonomic nervous system, unspecified: Secondary | ICD-10-CM | POA: Diagnosis not present

## 2022-01-12 DIAGNOSIS — I129 Hypertensive chronic kidney disease with stage 1 through stage 4 chronic kidney disease, or unspecified chronic kidney disease: Secondary | ICD-10-CM | POA: Diagnosis not present

## 2022-01-12 DIAGNOSIS — R457 State of emotional shock and stress, unspecified: Secondary | ICD-10-CM | POA: Diagnosis not present

## 2022-01-12 DIAGNOSIS — G4489 Other headache syndrome: Secondary | ICD-10-CM | POA: Diagnosis not present

## 2022-01-12 DIAGNOSIS — R4182 Altered mental status, unspecified: Secondary | ICD-10-CM | POA: Diagnosis not present

## 2022-01-12 DIAGNOSIS — I951 Orthostatic hypotension: Secondary | ICD-10-CM | POA: Diagnosis not present

## 2022-01-12 DIAGNOSIS — R001 Bradycardia, unspecified: Secondary | ICD-10-CM | POA: Diagnosis not present

## 2022-01-12 DIAGNOSIS — R911 Solitary pulmonary nodule: Secondary | ICD-10-CM | POA: Diagnosis not present

## 2022-01-12 DIAGNOSIS — R519 Headache, unspecified: Secondary | ICD-10-CM | POA: Diagnosis not present

## 2022-01-12 DIAGNOSIS — B965 Pseudomonas (aeruginosa) (mallei) (pseudomallei) as the cause of diseases classified elsewhere: Secondary | ICD-10-CM | POA: Diagnosis not present

## 2022-01-12 DIAGNOSIS — R471 Dysarthria and anarthria: Secondary | ICD-10-CM | POA: Diagnosis not present

## 2022-01-12 DIAGNOSIS — I10 Essential (primary) hypertension: Secondary | ICD-10-CM | POA: Diagnosis not present

## 2022-01-12 DIAGNOSIS — K219 Gastro-esophageal reflux disease without esophagitis: Secondary | ICD-10-CM | POA: Diagnosis not present

## 2022-01-12 DIAGNOSIS — I959 Hypotension, unspecified: Secondary | ICD-10-CM | POA: Diagnosis not present

## 2022-01-12 DIAGNOSIS — I674 Hypertensive encephalopathy: Secondary | ICD-10-CM | POA: Diagnosis not present

## 2022-01-12 DIAGNOSIS — I97638 Postprocedural hematoma of a circulatory system organ or structure following other circulatory system procedure: Secondary | ICD-10-CM | POA: Diagnosis not present

## 2022-01-12 DIAGNOSIS — I499 Cardiac arrhythmia, unspecified: Secondary | ICD-10-CM | POA: Diagnosis not present

## 2022-01-12 DIAGNOSIS — G253 Myoclonus: Secondary | ICD-10-CM | POA: Diagnosis not present

## 2022-01-12 DIAGNOSIS — F0390 Unspecified dementia without behavioral disturbance: Secondary | ICD-10-CM | POA: Diagnosis not present

## 2022-01-12 DIAGNOSIS — N766 Ulceration of vulva: Secondary | ICD-10-CM | POA: Diagnosis not present

## 2022-01-12 DIAGNOSIS — R410 Disorientation, unspecified: Secondary | ICD-10-CM | POA: Diagnosis not present

## 2022-01-13 DIAGNOSIS — I674 Hypertensive encephalopathy: Secondary | ICD-10-CM | POA: Diagnosis not present

## 2022-01-13 DIAGNOSIS — I161 Hypertensive emergency: Secondary | ICD-10-CM | POA: Diagnosis not present

## 2022-01-14 DIAGNOSIS — I6783 Posterior reversible encephalopathy syndrome: Secondary | ICD-10-CM | POA: Diagnosis not present

## 2022-01-14 DIAGNOSIS — G253 Myoclonus: Secondary | ICD-10-CM | POA: Diagnosis not present

## 2022-01-14 DIAGNOSIS — I674 Hypertensive encephalopathy: Secondary | ICD-10-CM | POA: Diagnosis not present

## 2022-01-20 DIAGNOSIS — I129 Hypertensive chronic kidney disease with stage 1 through stage 4 chronic kidney disease, or unspecified chronic kidney disease: Secondary | ICD-10-CM | POA: Diagnosis not present

## 2022-01-20 DIAGNOSIS — I951 Orthostatic hypotension: Secondary | ICD-10-CM | POA: Diagnosis not present

## 2022-01-20 DIAGNOSIS — I97638 Postprocedural hematoma of a circulatory system organ or structure following other circulatory system procedure: Secondary | ICD-10-CM | POA: Diagnosis not present

## 2022-01-20 DIAGNOSIS — U071 COVID-19: Secondary | ICD-10-CM | POA: Diagnosis not present

## 2022-01-20 DIAGNOSIS — L89893 Pressure ulcer of other site, stage 3: Secondary | ICD-10-CM | POA: Diagnosis not present

## 2022-01-20 DIAGNOSIS — I480 Paroxysmal atrial fibrillation: Secondary | ICD-10-CM | POA: Diagnosis not present

## 2022-01-20 DIAGNOSIS — I674 Hypertensive encephalopathy: Secondary | ICD-10-CM | POA: Diagnosis not present

## 2022-01-20 DIAGNOSIS — N1831 Chronic kidney disease, stage 3a: Secondary | ICD-10-CM | POA: Diagnosis not present

## 2022-01-21 DIAGNOSIS — I674 Hypertensive encephalopathy: Secondary | ICD-10-CM | POA: Diagnosis not present

## 2022-01-22 DIAGNOSIS — I674 Hypertensive encephalopathy: Secondary | ICD-10-CM | POA: Diagnosis not present

## 2022-01-22 DIAGNOSIS — K219 Gastro-esophageal reflux disease without esophagitis: Secondary | ICD-10-CM | POA: Diagnosis not present

## 2022-01-22 DIAGNOSIS — G4733 Obstructive sleep apnea (adult) (pediatric): Secondary | ICD-10-CM | POA: Diagnosis not present

## 2022-01-22 DIAGNOSIS — F419 Anxiety disorder, unspecified: Secondary | ICD-10-CM | POA: Diagnosis not present

## 2022-01-23 DIAGNOSIS — G4733 Obstructive sleep apnea (adult) (pediatric): Secondary | ICD-10-CM | POA: Diagnosis not present

## 2022-01-23 DIAGNOSIS — K219 Gastro-esophageal reflux disease without esophagitis: Secondary | ICD-10-CM | POA: Diagnosis not present

## 2022-01-23 DIAGNOSIS — F419 Anxiety disorder, unspecified: Secondary | ICD-10-CM | POA: Diagnosis not present

## 2022-01-23 DIAGNOSIS — I674 Hypertensive encephalopathy: Secondary | ICD-10-CM | POA: Diagnosis not present

## 2022-01-24 DIAGNOSIS — I674 Hypertensive encephalopathy: Secondary | ICD-10-CM | POA: Diagnosis not present

## 2022-01-24 DIAGNOSIS — I10 Essential (primary) hypertension: Secondary | ICD-10-CM | POA: Diagnosis not present

## 2022-01-24 DIAGNOSIS — R519 Headache, unspecified: Secondary | ICD-10-CM | POA: Diagnosis not present

## 2022-01-25 DIAGNOSIS — I161 Hypertensive emergency: Secondary | ICD-10-CM | POA: Diagnosis not present

## 2022-01-25 DIAGNOSIS — N766 Ulceration of vulva: Secondary | ICD-10-CM | POA: Diagnosis not present

## 2022-01-25 DIAGNOSIS — F419 Anxiety disorder, unspecified: Secondary | ICD-10-CM | POA: Diagnosis not present

## 2022-01-25 DIAGNOSIS — I674 Hypertensive encephalopathy: Secondary | ICD-10-CM | POA: Diagnosis not present

## 2022-01-26 DIAGNOSIS — N766 Ulceration of vulva: Secondary | ICD-10-CM | POA: Diagnosis not present

## 2022-01-26 DIAGNOSIS — I161 Hypertensive emergency: Secondary | ICD-10-CM | POA: Diagnosis not present

## 2022-01-26 DIAGNOSIS — F419 Anxiety disorder, unspecified: Secondary | ICD-10-CM | POA: Diagnosis not present

## 2022-01-26 DIAGNOSIS — I674 Hypertensive encephalopathy: Secondary | ICD-10-CM | POA: Diagnosis not present

## 2022-01-27 DIAGNOSIS — M799 Soft tissue disorder, unspecified: Secondary | ICD-10-CM | POA: Diagnosis not present

## 2022-01-27 DIAGNOSIS — I2510 Atherosclerotic heart disease of native coronary artery without angina pectoris: Secondary | ICD-10-CM | POA: Diagnosis not present

## 2022-01-27 DIAGNOSIS — R911 Solitary pulmonary nodule: Secondary | ICD-10-CM | POA: Diagnosis not present

## 2022-02-01 DIAGNOSIS — F0390 Unspecified dementia without behavioral disturbance: Secondary | ICD-10-CM | POA: Diagnosis not present

## 2022-02-01 DIAGNOSIS — I480 Paroxysmal atrial fibrillation: Secondary | ICD-10-CM | POA: Diagnosis not present

## 2022-02-01 DIAGNOSIS — R5381 Other malaise: Secondary | ICD-10-CM | POA: Diagnosis not present

## 2022-02-01 DIAGNOSIS — I129 Hypertensive chronic kidney disease with stage 1 through stage 4 chronic kidney disease, or unspecified chronic kidney disease: Secondary | ICD-10-CM | POA: Diagnosis not present

## 2022-02-01 DIAGNOSIS — R519 Headache, unspecified: Secondary | ICD-10-CM | POA: Diagnosis not present

## 2022-02-01 DIAGNOSIS — R410 Disorientation, unspecified: Secondary | ICD-10-CM | POA: Diagnosis not present

## 2022-02-01 DIAGNOSIS — R531 Weakness: Secondary | ICD-10-CM | POA: Diagnosis not present

## 2022-02-01 DIAGNOSIS — E785 Hyperlipidemia, unspecified: Secondary | ICD-10-CM | POA: Diagnosis not present

## 2022-02-01 DIAGNOSIS — R279 Unspecified lack of coordination: Secondary | ICD-10-CM | POA: Diagnosis not present

## 2022-02-01 DIAGNOSIS — I674 Hypertensive encephalopathy: Secondary | ICD-10-CM | POA: Diagnosis not present

## 2022-02-01 DIAGNOSIS — I4510 Unspecified right bundle-branch block: Secondary | ICD-10-CM | POA: Diagnosis not present

## 2022-02-01 DIAGNOSIS — R29810 Facial weakness: Secondary | ICD-10-CM | POA: Diagnosis not present

## 2022-02-01 DIAGNOSIS — N189 Chronic kidney disease, unspecified: Secondary | ICD-10-CM | POA: Diagnosis not present

## 2022-02-01 DIAGNOSIS — I959 Hypotension, unspecified: Secondary | ICD-10-CM | POA: Diagnosis not present

## 2022-02-01 DIAGNOSIS — R4781 Slurred speech: Secondary | ICD-10-CM | POA: Diagnosis not present

## 2022-02-01 DIAGNOSIS — E039 Hypothyroidism, unspecified: Secondary | ICD-10-CM | POA: Diagnosis not present

## 2022-02-01 DIAGNOSIS — R918 Other nonspecific abnormal finding of lung field: Secondary | ICD-10-CM | POA: Diagnosis not present

## 2022-02-01 DIAGNOSIS — I4891 Unspecified atrial fibrillation: Secondary | ICD-10-CM | POA: Diagnosis not present

## 2022-02-01 DIAGNOSIS — G909 Disorder of the autonomic nervous system, unspecified: Secondary | ICD-10-CM | POA: Diagnosis not present

## 2022-02-01 DIAGNOSIS — R4182 Altered mental status, unspecified: Secondary | ICD-10-CM | POA: Diagnosis not present

## 2022-02-01 DIAGNOSIS — M6281 Muscle weakness (generalized): Secondary | ICD-10-CM | POA: Diagnosis not present

## 2022-02-01 DIAGNOSIS — I10 Essential (primary) hypertension: Secondary | ICD-10-CM | POA: Diagnosis not present

## 2022-02-01 DIAGNOSIS — G4733 Obstructive sleep apnea (adult) (pediatric): Secondary | ICD-10-CM | POA: Diagnosis not present

## 2022-02-11 DIAGNOSIS — I10 Essential (primary) hypertension: Secondary | ICD-10-CM | POA: Diagnosis not present

## 2022-02-11 DIAGNOSIS — Z09 Encounter for follow-up examination after completed treatment for conditions other than malignant neoplasm: Secondary | ICD-10-CM | POA: Diagnosis not present

## 2022-02-18 DIAGNOSIS — E039 Hypothyroidism, unspecified: Secondary | ICD-10-CM | POA: Diagnosis not present

## 2022-02-18 DIAGNOSIS — M1990 Unspecified osteoarthritis, unspecified site: Secondary | ICD-10-CM | POA: Diagnosis not present

## 2022-02-18 DIAGNOSIS — N189 Chronic kidney disease, unspecified: Secondary | ICD-10-CM | POA: Diagnosis not present

## 2022-02-18 DIAGNOSIS — I129 Hypertensive chronic kidney disease with stage 1 through stage 4 chronic kidney disease, or unspecified chronic kidney disease: Secondary | ICD-10-CM | POA: Diagnosis not present

## 2022-02-18 DIAGNOSIS — I480 Paroxysmal atrial fibrillation: Secondary | ICD-10-CM | POA: Diagnosis not present

## 2022-02-18 DIAGNOSIS — F0284 Dementia in other diseases classified elsewhere, unspecified severity, with anxiety: Secondary | ICD-10-CM | POA: Diagnosis not present

## 2022-02-18 DIAGNOSIS — I82402 Acute embolism and thrombosis of unspecified deep veins of left lower extremity: Secondary | ICD-10-CM | POA: Diagnosis not present

## 2022-02-20 DIAGNOSIS — N766 Ulceration of vulva: Secondary | ICD-10-CM | POA: Diagnosis not present

## 2022-02-20 DIAGNOSIS — C510 Malignant neoplasm of labium majus: Secondary | ICD-10-CM | POA: Diagnosis not present

## 2022-02-20 DIAGNOSIS — D492 Neoplasm of unspecified behavior of bone, soft tissue, and skin: Secondary | ICD-10-CM | POA: Diagnosis not present

## 2022-02-20 DIAGNOSIS — B965 Pseudomonas (aeruginosa) (mallei) (pseudomallei) as the cause of diseases classified elsewhere: Secondary | ICD-10-CM | POA: Diagnosis not present

## 2022-02-25 DIAGNOSIS — F0394 Unspecified dementia, unspecified severity, with anxiety: Secondary | ICD-10-CM | POA: Diagnosis not present

## 2022-02-25 DIAGNOSIS — I129 Hypertensive chronic kidney disease with stage 1 through stage 4 chronic kidney disease, or unspecified chronic kidney disease: Secondary | ICD-10-CM | POA: Diagnosis not present

## 2022-02-25 DIAGNOSIS — F418 Other specified anxiety disorders: Secondary | ICD-10-CM | POA: Diagnosis not present

## 2022-02-25 DIAGNOSIS — I480 Paroxysmal atrial fibrillation: Secondary | ICD-10-CM | POA: Diagnosis not present

## 2022-02-25 DIAGNOSIS — N184 Chronic kidney disease, stage 4 (severe): Secondary | ICD-10-CM | POA: Diagnosis not present

## 2022-02-25 DIAGNOSIS — F4325 Adjustment disorder with mixed disturbance of emotions and conduct: Secondary | ICD-10-CM | POA: Diagnosis not present

## 2022-02-25 DIAGNOSIS — L89893 Pressure ulcer of other site, stage 3: Secondary | ICD-10-CM | POA: Diagnosis not present

## 2022-03-04 DIAGNOSIS — Z96652 Presence of left artificial knee joint: Secondary | ICD-10-CM | POA: Diagnosis not present

## 2022-03-04 DIAGNOSIS — Z96651 Presence of right artificial knee joint: Secondary | ICD-10-CM | POA: Diagnosis not present

## 2022-03-05 DIAGNOSIS — I10 Essential (primary) hypertension: Secondary | ICD-10-CM | POA: Diagnosis not present

## 2022-03-11 DIAGNOSIS — I781 Nevus, non-neoplastic: Secondary | ICD-10-CM | POA: Diagnosis not present

## 2022-03-11 DIAGNOSIS — C510 Malignant neoplasm of labium majus: Secondary | ICD-10-CM | POA: Diagnosis not present

## 2022-03-11 DIAGNOSIS — L578 Other skin changes due to chronic exposure to nonionizing radiation: Secondary | ICD-10-CM | POA: Diagnosis not present

## 2022-03-11 DIAGNOSIS — L988 Other specified disorders of the skin and subcutaneous tissue: Secondary | ICD-10-CM | POA: Diagnosis not present

## 2022-03-11 DIAGNOSIS — L814 Other melanin hyperpigmentation: Secondary | ICD-10-CM | POA: Diagnosis not present

## 2022-03-11 DIAGNOSIS — C511 Malignant neoplasm of labium minus: Secondary | ICD-10-CM | POA: Diagnosis not present

## 2022-03-12 DIAGNOSIS — I10 Essential (primary) hypertension: Secondary | ICD-10-CM | POA: Diagnosis not present

## 2022-03-12 DIAGNOSIS — I2699 Other pulmonary embolism without acute cor pulmonale: Secondary | ICD-10-CM | POA: Diagnosis not present

## 2022-03-12 DIAGNOSIS — F321 Major depressive disorder, single episode, moderate: Secondary | ICD-10-CM | POA: Diagnosis not present

## 2022-03-12 DIAGNOSIS — Z1389 Encounter for screening for other disorder: Secondary | ICD-10-CM | POA: Diagnosis not present

## 2022-03-12 DIAGNOSIS — F419 Anxiety disorder, unspecified: Secondary | ICD-10-CM | POA: Diagnosis not present

## 2022-03-12 DIAGNOSIS — Z0000 Encounter for general adult medical examination without abnormal findings: Secondary | ICD-10-CM | POA: Diagnosis not present

## 2022-03-12 DIAGNOSIS — I480 Paroxysmal atrial fibrillation: Secondary | ICD-10-CM | POA: Diagnosis not present

## 2022-03-12 DIAGNOSIS — E892 Postprocedural hypoparathyroidism: Secondary | ICD-10-CM | POA: Diagnosis not present

## 2022-03-12 DIAGNOSIS — E890 Postprocedural hypothyroidism: Secondary | ICD-10-CM | POA: Diagnosis not present

## 2022-03-24 DIAGNOSIS — C518 Malignant neoplasm of overlapping sites of vulva: Secondary | ICD-10-CM | POA: Diagnosis not present

## 2022-03-24 DIAGNOSIS — C061 Malignant neoplasm of vestibule of mouth: Secondary | ICD-10-CM | POA: Diagnosis not present

## 2022-03-24 DIAGNOSIS — C519 Malignant neoplasm of vulva, unspecified: Secondary | ICD-10-CM | POA: Diagnosis not present

## 2022-04-16 DIAGNOSIS — I6523 Occlusion and stenosis of bilateral carotid arteries: Secondary | ICD-10-CM | POA: Diagnosis not present

## 2022-04-16 DIAGNOSIS — E7800 Pure hypercholesterolemia, unspecified: Secondary | ICD-10-CM | POA: Diagnosis not present

## 2022-04-16 DIAGNOSIS — I480 Paroxysmal atrial fibrillation: Secondary | ICD-10-CM | POA: Diagnosis not present

## 2022-04-16 DIAGNOSIS — I10 Essential (primary) hypertension: Secondary | ICD-10-CM | POA: Diagnosis not present

## 2022-05-05 ENCOUNTER — Encounter: Payer: Self-pay | Admitting: Emergency Medicine

## 2022-05-05 ENCOUNTER — Emergency Department
Admission: EM | Admit: 2022-05-05 | Discharge: 2022-05-05 | Disposition: A | Discharge status: Home / Self Care | Payer: Medicare HMO | Attending: Emergency Medicine | Admitting: Emergency Medicine

## 2022-05-05 ENCOUNTER — Emergency Department: Payer: Medicare HMO

## 2022-05-05 DIAGNOSIS — E039 Hypothyroidism, unspecified: Secondary | ICD-10-CM | POA: Insufficient documentation

## 2022-05-05 DIAGNOSIS — F0390 Unspecified dementia without behavioral disturbance: Secondary | ICD-10-CM | POA: Insufficient documentation

## 2022-05-05 DIAGNOSIS — R202 Paresthesia of skin: Secondary | ICD-10-CM | POA: Insufficient documentation

## 2022-05-05 DIAGNOSIS — H02402 Unspecified ptosis of left eyelid: Secondary | ICD-10-CM | POA: Insufficient documentation

## 2022-05-05 DIAGNOSIS — R519 Headache, unspecified: Secondary | ICD-10-CM

## 2022-05-05 DIAGNOSIS — I10 Essential (primary) hypertension: Secondary | ICD-10-CM | POA: Insufficient documentation

## 2022-05-05 DIAGNOSIS — G4489 Other headache syndrome: Secondary | ICD-10-CM | POA: Diagnosis not present

## 2022-05-05 DIAGNOSIS — I4891 Unspecified atrial fibrillation: Secondary | ICD-10-CM | POA: Diagnosis not present

## 2022-05-05 HISTORY — DX: Malignant (primary) neoplasm, unspecified: C80.1

## 2022-05-05 LAB — TSH: TSH: 1.53 u[IU]/mL (ref 0.350–4.500)

## 2022-05-05 LAB — CBC WITH DIFFERENTIAL/PLATELET
Abs Immature Granulocytes: 0.01 10*3/uL (ref 0.00–0.07)
Basophils Absolute: 0 10*3/uL (ref 0.0–0.1)
Basophils Relative: 1 %
Eosinophils Absolute: 0.1 10*3/uL (ref 0.0–0.5)
Eosinophils Relative: 1 %
HCT: 31.5 % — ABNORMAL LOW (ref 36.0–46.0)
Hemoglobin: 9.2 g/dL — ABNORMAL LOW (ref 12.0–15.0)
Immature Granulocytes: 0 %
Lymphocytes Relative: 18 %
Lymphs Abs: 1.1 10*3/uL (ref 0.7–4.0)
MCH: 23.8 pg — ABNORMAL LOW (ref 26.0–34.0)
MCHC: 29.2 g/dL — ABNORMAL LOW (ref 30.0–36.0)
MCV: 81.6 fL (ref 80.0–100.0)
Monocytes Absolute: 0.7 10*3/uL (ref 0.1–1.0)
Monocytes Relative: 11 %
Neutro Abs: 4.4 10*3/uL (ref 1.7–7.7)
Neutrophils Relative %: 69 %
Platelets: 323 10*3/uL (ref 150–400)
RBC: 3.86 MIL/uL — ABNORMAL LOW (ref 3.87–5.11)
RDW: 16.9 % — ABNORMAL HIGH (ref 11.5–15.5)
WBC: 6.4 10*3/uL (ref 4.0–10.5)
nRBC: 0 % (ref 0.0–0.2)

## 2022-05-05 LAB — BASIC METABOLIC PANEL
Anion gap: 9 (ref 5–15)
BUN: 14 mg/dL (ref 8–23)
CO2: 26 mmol/L (ref 22–32)
Calcium: 9.5 mg/dL (ref 8.9–10.3)
Chloride: 102 mmol/L (ref 98–111)
Creatinine, Ser: 0.85 mg/dL (ref 0.44–1.00)
GFR, Estimated: >60 mL/min (ref >60)
Glucose, Bld: 97 mg/dL (ref 70–99)
Potassium: 4.1 mmol/L (ref 3.5–5.1)
Sodium: 137 mmol/L (ref 135–145)

## 2022-05-05 LAB — T4, FREE: Free T4: 1.11 ng/dL (ref 0.61–1.12)

## 2022-05-05 LAB — TROPONIN I (HIGH SENSITIVITY): Troponin I (High Sensitivity): 7 ng/L (ref <18)

## 2022-05-05 MED ORDER — AMLODIPINE BESYLATE 5 MG PO TABS
5.0000 mg | ORAL_TABLET | ORAL | Status: AC
Start: 2022-05-05 — End: 2022-05-05
  Administered 2022-05-05: 5 mg via ORAL
  Filled 2022-05-05: qty 1

## 2022-05-05 MED ORDER — METOPROLOL TARTRATE 25 MG PO TABS
25.0000 mg | ORAL_TABLET | ORAL | Status: AC
Start: 2022-05-05 — End: 2022-05-05
  Administered 2022-05-05: 25 mg via ORAL
  Filled 2022-05-05: qty 1

## 2022-05-05 MED ORDER — LACTATED RINGERS IV BOLUS
1000.0000 mL | Freq: Once | INTRAVENOUS | Status: AC
  Administered 2022-05-05: 1000 mL via INTRAVENOUS

## 2022-05-05 MED ORDER — PROCHLORPERAZINE EDISYLATE 10 MG/2ML IJ SOLN
10.0000 mg | Freq: Once | INTRAMUSCULAR | Status: AC
Start: 2022-05-05 — End: 2022-05-05
  Administered 2022-05-05: 10 mg via INTRAVENOUS
  Filled 2022-05-05: qty 2

## 2022-05-05 MED ORDER — CLONIDINE HCL 0.1 MG PO TABS
0.1000 mg | ORAL_TABLET | Freq: Once | ORAL | Status: AC
  Administered 2022-05-05: 0.1 mg via ORAL
  Filled 2022-05-05: qty 1

## 2022-05-05 MED ORDER — MAGNESIUM SULFATE 2 GM/50ML IV SOLN
2.0000 g | Freq: Once | INTRAVENOUS | Status: AC
  Administered 2022-05-05: 2 g via INTRAVENOUS
  Filled 2022-05-05: qty 50

## 2022-05-05 NOTE — ED Notes (Signed)
Patient assisted to restroom by this RN. Patient's bed and pants wet. Patient cleaned and placed in dry pants/brief. Bed liens changed and warm blanket given. No needs expressed to RN at this time.  Pt placed on 2L [place] due to sats dropping to 85% on RA while sleeping. Tamala Julian, MD aware.

## 2022-05-05 NOTE — Discharge Instructions (Addendum)
Your CT scan of the head and lab tests were all okay today.  A refill of your blood pressure medicine has been sent to your pharmacy.  Please follow-up with your primary care doctor later this week for recheck of your blood pressure.

## 2022-05-05 NOTE — ED Notes (Signed)
Pt's niece will come pick her up.

## 2022-05-05 NOTE — ED Notes (Signed)
Pt resting comfortably, breathing easy and unlabored, no acute distress, will continue to monitor.

## 2022-05-05 NOTE — ED Notes (Signed)
Received report from Kathryn RN. 

## 2022-05-05 NOTE — ED Provider Notes (Signed)
Procedures     ----------------------------------------- 9:10 PM on 05/05/2022 ----------------------------------------- Symptoms resolved after headache medications, tolerating oral intake and eager for discharge home.     Carrie Mew, MD 05/23/22 [phone]

## 2022-05-05 NOTE — ED Provider Notes (Signed)
[name] Provider Note    Event Date/Time   First MD Initiated Contact with Patient 05/05/22 1300     (approximate)   History   Hypertension   HPI  Erin Sutton is a 81 y.o. 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  Patient states she had a mild headache when she woke up around 6 to take her blood pressure and her medicines but that got a little worse before getting better.  She states he currently has no headache.  She has no nausea or tingling at this time.  She denies any focal weakness or any clear focal symptoms.  No recent chest pain, cough, shortness of breath, back pain, nausea, vomiting, diarrhea, rash or any other sick symptoms.  She does note she has been out of her amlodipine for couple days and expecting a refill to arrive in the mail in the next couple days.  She reports she has been taking her spironolactone and metoprolol although has not had her metoprolol today.  No tobacco abuse or EtOH use.  She has no other acute concerns at this time.  She does note she thinks her blood pressures have been over 200 since at least last week.  She also notes she has some chronic droopiness of the left eye that is been present for several months.      Physical Exam  Triage Vital Signs: ED Triage Vitals  Enc Vitals Group     BP      Pulse      Resp      Temp      Temp src      SpO2      Weight      Height      Head Circumference      Peak Flow      Pain Score      Pain Loc      Pain Edu?      Excl. in [place]?     Most recent vital signs: Vitals:   05/05/22 1400 05/05/22 1432  BP: (!) 234/89 (!) 189/92  Pulse: 86 68  Resp: 18 18  Temp:    SpO2: 97% 97%    General: Awake, no distress.   CV:  Good peripheral perfusion.  2+ radial pulses. Resp:  Normal effort.  Abd:  No distention.  Soft. Other:  Cranial nerves II through XII grossly intact with exception of some chronic ptosis on the left.  No pronator drift.  No finger dysmetria.  Symmetric 5/5 strength of all extremities.  Sensation intact to light touch in all extremities.     ED Results / Procedures / Treatments  Labs (all labs ordered are listed, but only abnormal results are displayed) Labs Reviewed  CBC WITH DIFFERENTIAL/PLATELET - Abnormal; Notable for the following components:      Result Value   RBC 3.86 (*)    Hemoglobin 9.2 (*)    HCT 31.5 (*)    MCH 23.8 (*)    MCHC 29.2 (*)    RDW 16.9 (*)    All other components within normal limits  BASIC METABOLIC PANEL  TSH  T4, FREE  TROPONIN I (HIGH SENSITIVITY)     EKG  EKG is remarkable for sinus rhythm with a ventricular rate  of 73, right bundle branch block, Q waves in lead III with otherwise nonspecific ST change in V2 without other clear evidence of acute ischemia or significant arrhythmia.  Normal axis.  Today's EKG changes are compared to ECG from 12/08/2021 which also shows right bundle branch block and similar T wave inversion in V2 without other significant changes noted today.   RADIOLOGY  CT head, interpretation without evidence of ischemia, edema, mass effect or any other acute intracranial process.  I also reviewed radiology interpretation.   PROCEDURES:  Critical Care performed: No  Procedures   MEDICATIONS ORDERED IN ED: Medications  magnesium sulfate IVPB 2 g 50 mL (2 g Intravenous New Bag/Given 05/05/22 1502)  amLODipine (NORVASC) tablet 5 mg (5 mg Oral Given 05/05/22 1351)  metoprolol tartrate (LOPRESSOR) tablet 25 mg (25 mg Oral Given 05/05/22 1350)  prochlorperazine (COMPAZINE) injection 10 mg (10 mg Intravenous Given 05/05/22 1500)     IMPRESSION / MDM / ASSESSMENT AND PLAN / ED COURSE  I reviewed the triage vital signs  and the nursing notes. Patient's presentation is most consistent with acute presentation with potential threat to life or bodily function.                               Differential diagnosis includes, but is not limited to hypertensive emergency, SAH, ACS, kidney injury with overall given patient's history and exam with low suspicion for dissection, trauma, acute infectious process toxic ingestion or withdrawal  CT head, interpretation without evidence of ischemia, edema, mass effect or any other acute intracranial process.  I also reviewed radiology interpretation.  CBC without leukocytosis or evidence of acute anemia.  BMP without any significant electrolyte or metabolic derangements.  Troponin is not elevated given absence of any chest pain with otherwise relatively reassuring EKG low suspicion for ACS at this time.  TSH WNL.  Free T4 WNL.  On reassessment patient states her headache is returned little bit.  Her blood pressure slightly improved after she has received oral home blood pressure medicines.  We will give a dose of Compazine magnesium to see if this helps with her headache.  Care of patient signed over to his living provider at approximately 1500 with plan to reassess and likely discharge with close outpatient PCP follow-up.      FINAL CLINICAL IMPRESSION(S) / ED DIAGNOSES   Final diagnoses:  Nonintractable headache, unspecified chronicity pattern, unspecified headache type  Hypertension, unspecified type     Rx / DC Orders   ED Discharge Orders     None        Note:  This document was prepared using Dragon voice recognition software and may include unintentional dictation errors.   Lucrezia Starch, MD 05/05/22 [phone]

## 2022-05-05 NOTE — ED Notes (Signed)
Pt in hallway with oxygen sat 88% with nasal cannula in place. On assessment oxygen tank out of oxygen. Oxygen tank replaced with continued monitoring.

## 2022-05-05 NOTE — ED Triage Notes (Signed)
Patient to ED via [facility] from home for hypertension. Patient was initially 221/101 at home. Aox4 c/o headache.

## 2022-05-05 NOTE — ED Notes (Signed)
Patient to CT at this time

## 2022-05-09 ENCOUNTER — Inpatient Hospital Stay
Admission: EM | Admit: 2022-05-09 | Discharge: 2022-05-12 | DRG: 305 | Disposition: A | Discharge status: Home Health | Payer: Medicare HMO | Attending: Internal Medicine | Admitting: Internal Medicine

## 2022-05-09 ENCOUNTER — Other Ambulatory Visit: Payer: Self-pay

## 2022-05-09 ENCOUNTER — Encounter: Payer: Self-pay | Admitting: Medical Oncology

## 2022-05-09 ENCOUNTER — Emergency Department: Payer: Medicare HMO

## 2022-05-09 DIAGNOSIS — M810 Age-related osteoporosis without current pathological fracture: Secondary | ICD-10-CM | POA: Diagnosis present

## 2022-05-09 DIAGNOSIS — Z86718 Personal history of other venous thrombosis and embolism: Secondary | ICD-10-CM | POA: Diagnosis not present

## 2022-05-09 DIAGNOSIS — I959 Hypotension, unspecified: Secondary | ICD-10-CM | POA: Diagnosis not present

## 2022-05-09 DIAGNOSIS — Z91048 Other nonmedicinal substance allergy status: Secondary | ICD-10-CM

## 2022-05-09 DIAGNOSIS — M1990 Unspecified osteoarthritis, unspecified site: Secondary | ICD-10-CM | POA: Diagnosis present

## 2022-05-09 DIAGNOSIS — G4730 Sleep apnea, unspecified: Secondary | ICD-10-CM | POA: Diagnosis present

## 2022-05-09 DIAGNOSIS — G4733 Obstructive sleep apnea (adult) (pediatric): Secondary | ICD-10-CM | POA: Diagnosis present

## 2022-05-09 DIAGNOSIS — I480 Paroxysmal atrial fibrillation: Secondary | ICD-10-CM | POA: Diagnosis present

## 2022-05-09 DIAGNOSIS — Z880 Allergy status to penicillin: Secondary | ICD-10-CM

## 2022-05-09 DIAGNOSIS — Z8673 Personal history of transient ischemic attack (TIA), and cerebral infarction without residual deficits: Secondary | ICD-10-CM | POA: Diagnosis not present

## 2022-05-09 DIAGNOSIS — Z882 Allergy status to sulfonamides status: Secondary | ICD-10-CM

## 2022-05-09 DIAGNOSIS — Z7982 Long term (current) use of aspirin: Secondary | ICD-10-CM

## 2022-05-09 DIAGNOSIS — I160 Hypertensive urgency: Principal | ICD-10-CM | POA: Diagnosis present

## 2022-05-09 DIAGNOSIS — Z8249 Family history of ischemic heart disease and other diseases of the circulatory system: Secondary | ICD-10-CM

## 2022-05-09 DIAGNOSIS — Z20822 Contact with and (suspected) exposure to covid-19: Secondary | ICD-10-CM | POA: Diagnosis not present

## 2022-05-09 DIAGNOSIS — R519 Headache, unspecified: Secondary | ICD-10-CM | POA: Diagnosis not present

## 2022-05-09 DIAGNOSIS — I129 Hypertensive chronic kidney disease with stage 1 through stage 4 chronic kidney disease, or unspecified chronic kidney disease: Secondary | ICD-10-CM | POA: Diagnosis not present

## 2022-05-09 DIAGNOSIS — G459 Transient cerebral ischemic attack, unspecified: Secondary | ICD-10-CM | POA: Diagnosis not present

## 2022-05-09 DIAGNOSIS — R112 Nausea with vomiting, unspecified: Secondary | ICD-10-CM | POA: Diagnosis not present

## 2022-05-09 DIAGNOSIS — Z885 Allergy status to narcotic agent status: Secondary | ICD-10-CM

## 2022-05-09 DIAGNOSIS — Z96651 Presence of right artificial knee joint: Secondary | ICD-10-CM | POA: Diagnosis present

## 2022-05-09 DIAGNOSIS — Z881 Allergy status to other antibiotic agents status: Secondary | ICD-10-CM

## 2022-05-09 DIAGNOSIS — I10 Essential (primary) hypertension: Secondary | ICD-10-CM

## 2022-05-09 DIAGNOSIS — Z7901 Long term (current) use of anticoagulants: Secondary | ICD-10-CM

## 2022-05-09 DIAGNOSIS — E785 Hyperlipidemia, unspecified: Secondary | ICD-10-CM | POA: Diagnosis not present

## 2022-05-09 DIAGNOSIS — N182 Chronic kidney disease, stage 2 (mild): Secondary | ICD-10-CM | POA: Diagnosis present

## 2022-05-09 DIAGNOSIS — Z888 Allergy status to other drugs, medicaments and biological substances status: Secondary | ICD-10-CM

## 2022-05-09 DIAGNOSIS — C519 Malignant neoplasm of vulva, unspecified: Secondary | ICD-10-CM | POA: Diagnosis present

## 2022-05-09 DIAGNOSIS — Z86711 Personal history of pulmonary embolism: Secondary | ICD-10-CM

## 2022-05-09 DIAGNOSIS — I82412 Acute embolism and thrombosis of left femoral vein: Secondary | ICD-10-CM | POA: Diagnosis not present

## 2022-05-09 DIAGNOSIS — Z79890 Hormone replacement therapy (postmenopausal): Secondary | ICD-10-CM | POA: Diagnosis not present

## 2022-05-09 DIAGNOSIS — G4489 Other headache syndrome: Secondary | ICD-10-CM | POA: Diagnosis not present

## 2022-05-09 DIAGNOSIS — Z79899 Other long term (current) drug therapy: Secondary | ICD-10-CM | POA: Diagnosis not present

## 2022-05-09 DIAGNOSIS — E039 Hypothyroidism, unspecified: Secondary | ICD-10-CM | POA: Diagnosis not present

## 2022-05-09 DIAGNOSIS — I82402 Acute embolism and thrombosis of unspecified deep veins of left lower extremity: Secondary | ICD-10-CM | POA: Diagnosis present

## 2022-05-09 DIAGNOSIS — I4510 Unspecified right bundle-branch block: Secondary | ICD-10-CM | POA: Diagnosis not present

## 2022-05-09 DIAGNOSIS — K219 Gastro-esophageal reflux disease without esophagitis: Secondary | ICD-10-CM | POA: Diagnosis present

## 2022-05-09 HISTORY — DX: Headache, unspecified: R51.9

## 2022-05-09 LAB — COMPREHENSIVE METABOLIC PANEL
ALT: 14 U/L (ref 0–44)
AST: 31 U/L (ref 15–41)
Albumin: 4.4 g/dL (ref 3.5–5.0)
Alkaline Phosphatase: 58 U/L (ref 38–126)
Anion gap: 10 (ref 5–15)
BUN: 10 mg/dL (ref 8–23)
CO2: 28 mmol/L (ref 22–32)
Calcium: 10.4 mg/dL — ABNORMAL HIGH (ref 8.9–10.3)
Chloride: 98 mmol/L (ref 98–111)
Creatinine, Ser: 0.79 mg/dL (ref 0.44–1.00)
GFR, Estimated: >60 mL/min (ref >60)
Glucose, Bld: 101 mg/dL — ABNORMAL HIGH (ref 70–99)
Potassium: 4.6 mmol/L (ref 3.5–5.1)
Sodium: 136 mmol/L (ref 135–145)
Total Bilirubin: 0.8 mg/dL (ref 0.3–1.2)
Total Protein: 8.1 g/dL (ref 6.5–8.1)

## 2022-05-09 LAB — CBC WITH DIFFERENTIAL/PLATELET
Abs Immature Granulocytes: 0.01 10*3/uL (ref 0.00–0.07)
Basophils Absolute: 0 10*3/uL (ref 0.0–0.1)
Basophils Relative: 1 %
Eosinophils Absolute: 0.1 10*3/uL (ref 0.0–0.5)
Eosinophils Relative: 3 %
HCT: 35.3 % — ABNORMAL LOW (ref 36.0–46.0)
Hemoglobin: 10.5 g/dL — ABNORMAL LOW (ref 12.0–15.0)
Immature Granulocytes: 0 %
Lymphocytes Relative: 22 %
Lymphs Abs: 1.2 10*3/uL (ref 0.7–4.0)
MCH: 24 pg — ABNORMAL LOW (ref 26.0–34.0)
MCHC: 29.7 g/dL — ABNORMAL LOW (ref 30.0–36.0)
MCV: 80.6 fL (ref 80.0–100.0)
Monocytes Absolute: 0.6 10*3/uL (ref 0.1–1.0)
Monocytes Relative: 11 %
Neutro Abs: 3.4 10*3/uL (ref 1.7–7.7)
Neutrophils Relative %: 63 %
Platelets: 347 10*3/uL (ref 150–400)
RBC: 4.38 MIL/uL (ref 3.87–5.11)
RDW: 16.7 % — ABNORMAL HIGH (ref 11.5–15.5)
WBC: 5.3 10*3/uL (ref 4.0–10.5)
nRBC: 0 % (ref 0.0–0.2)

## 2022-05-09 LAB — SARS CORONAVIRUS 2 BY RT PCR: SARS Coronavirus 2 by RT PCR: NEGATIVE

## 2022-05-09 LAB — BLOOD GAS, VENOUS
Acid-Base Excess: 8.3 mmol/L — ABNORMAL HIGH (ref 0.0–2.0)
Bicarbonate: 34 mmol/L — ABNORMAL HIGH (ref 20.0–28.0)
O2 Saturation: 63.2 %
Patient temperature: 37
pCO2, Ven: 50 mmHg (ref 44–60)
pH, Ven: 7.44 — ABNORMAL HIGH (ref 7.25–7.43)
pO2, Ven: 38 mmHg (ref 32–45)

## 2022-05-09 MED ORDER — DULOXETINE HCL 30 MG PO CPEP
30.0000 mg | ORAL_CAPSULE | Freq: Every day | ORAL | Status: DC
  Administered 2022-05-10 – 2022-05-12 (×3): 30 mg via ORAL
  Filled 2022-05-09 (×3): qty 1

## 2022-05-09 MED ORDER — HYDRALAZINE HCL 20 MG/ML IJ SOLN
10.0000 mg | INTRAMUSCULAR | Status: DC | PRN
Start: 2022-05-09 — End: 2022-05-10
  Administered 2022-05-10: 10 mg via INTRAVENOUS
  Filled 2022-05-09 (×2): qty 1

## 2022-05-09 MED ORDER — SENNOSIDES-DOCUSATE SODIUM 8.6-50 MG PO TABS
2.0000 | ORAL_TABLET | Freq: Two times a day (BID) | ORAL | Status: DC | PRN
Start: 2022-05-09 — End: 2022-05-11

## 2022-05-09 MED ORDER — ACETAMINOPHEN 325 MG PO TABS
650.0000 mg | ORAL_TABLET | Freq: Four times a day (QID) | ORAL | Status: DC | PRN
  Administered 2022-05-10: 650 mg via ORAL
  Filled 2022-05-09: qty 2

## 2022-05-09 MED ORDER — APIXABAN 5 MG PO TABS
5.0000 mg | ORAL_TABLET | Freq: Two times a day (BID) | ORAL | Status: DC
  Administered 2022-05-09 – 2022-05-12 (×6): 5 mg via ORAL
  Filled 2022-05-09 (×6): qty 1

## 2022-05-09 MED ORDER — METOPROLOL SUCCINATE ER 25 MG PO TB24
25.0000 mg | ORAL_TABLET | Freq: Every day | ORAL | Status: DC
  Administered 2022-05-11: 25 mg via ORAL
  Filled 2022-05-09: qty 1

## 2022-05-09 MED ORDER — TRAZODONE HCL 50 MG PO TABS: 50.0000 mg | ORAL_TABLET | Freq: Every evening | ORAL | Status: DC | PRN

## 2022-05-09 MED ORDER — POLYETHYL GLYCOL-PROPYL GLYCOL 0.4-0.3 % OP GEL: Freq: Two times a day (BID) | OPHTHALMIC | Status: DC | PRN

## 2022-05-09 MED ORDER — HYDRALAZINE HCL 50 MG PO TABS: 25.0000 mg | ORAL_TABLET | Freq: Three times a day (TID) | ORAL | Status: DC

## 2022-05-09 MED ORDER — PANTOPRAZOLE SODIUM 40 MG PO TBEC
40.0000 mg | DELAYED_RELEASE_TABLET | Freq: Two times a day (BID) | ORAL | Status: DC
  Administered 2022-05-10 – 2022-05-12 (×5): 40 mg via ORAL
  Filled 2022-05-09 (×5): qty 1

## 2022-05-09 MED ORDER — SPIRONOLACTONE 25 MG PO TABS
12.5000 mg | ORAL_TABLET | Freq: Every day | ORAL | Status: DC
  Administered 2022-05-10 – 2022-05-12 (×3): 12.5 mg via ORAL
  Filled 2022-05-09: qty 0.5
  Filled 2022-05-09 (×2): qty 1
  Filled 2022-05-09: qty 0.5
  Filled 2022-05-09: qty 1
  Filled 2022-05-09: qty 0.5

## 2022-05-09 MED ORDER — HYDROCODONE-ACETAMINOPHEN 5-325 MG PO TABS
1.0000 | ORAL_TABLET | ORAL | Status: DC
  Administered 2022-05-09 – 2022-05-12 (×15): 1 via ORAL
  Filled 2022-05-09 (×20): qty 1

## 2022-05-09 MED ORDER — LEVOTHYROXINE SODIUM 50 MCG PO TABS
50.0000 ug | ORAL_TABLET | Freq: Every day | ORAL | Status: DC
  Administered 2022-05-10 – 2022-05-12 (×3): 50 ug via ORAL
  Filled 2022-05-09 (×3): qty 1

## 2022-05-09 MED ORDER — ONDANSETRON HCL 4 MG/2ML IJ SOLN
4.0000 mg | Freq: Three times a day (TID) | INTRAMUSCULAR | Status: DC | PRN
  Administered 2022-05-10 – 2022-05-11 (×2): 4 mg via INTRAVENOUS
  Filled 2022-05-09 (×2): qty 2

## 2022-05-09 MED ORDER — ATORVASTATIN CALCIUM 20 MG PO TABS
40.0000 mg | ORAL_TABLET | Freq: Every day | ORAL | Status: DC
  Administered 2022-05-10 – 2022-05-12 (×3): 40 mg via ORAL
  Filled 2022-05-09 (×3): qty 2

## 2022-05-09 MED ORDER — LABETALOL HCL 5 MG/ML IV SOLN
20.0000 mg | Freq: Once | INTRAVENOUS | Status: AC
  Administered 2022-05-09: 20 mg via INTRAVENOUS
  Filled 2022-05-09: qty 4

## 2022-05-09 MED ORDER — POLYETHYLENE GLYCOL 3350 17 GM/SCOOP PO POWD
17.0000 g | Freq: Every day | ORAL | Status: DC | PRN
Start: 2022-05-09 — End: 2022-05-11

## 2022-05-09 MED ORDER — AMLODIPINE BESYLATE 10 MG PO TABS
10.0000 mg | ORAL_TABLET | Freq: Every day | ORAL | Status: DC
  Administered 2022-05-09 – 2022-05-11 (×3): 10 mg via ORAL
  Filled 2022-05-09 (×2): qty 2
  Filled 2022-05-09: qty 1

## 2022-05-09 NOTE — H&P (Signed)
History and Physical    Erin Sutton QIW:[id number] DOB: 17-Feb-1941 DOA: 05/09/2022  Referring MD/NP/PA:   PCP: Baxter Hire, MD   Patient coming from:  The patient is coming from home.  At baseline, pt is independent for most of ADL.        Chief Complaint: Elevated blood pressure, headache  HPI: Erin Sutton is a 81 y.o. female with medical history significant of hypertension, hyperlipidemia, TIA, hypothyroidism, PAF and DVT/PE on Eliquis, OSA on CPAP, hypercalcemia, recently diagnosed vulvar cancer, who presents with elevated blood pressure and headache.  Patient states that her blood pressure has not been controlled recently.  She had elevated blood pressure and headache, and was seen in ED on 6/12.  She had a negative CT head on 6/12. Patient is taking amlodipine 2.5 mg daily, metoprolol 25 mg daily and spironolactone 12.5 mg daily currently. She states that she developed headache this early morning, which is located in the bilateral frontal area, constant, dull, moderate, nonradiating.  She checked her blood pressure which is elevated.  Her blood pressure is 269/96 in ED. Patient denies unilateral numbness or tinglings in extremities.  No facial droop or slurred speech.  Patient does not have chest pain, cough, shortness breath.  She has nausea, but no vomiting, diarrhea or abdominal pain.  No symptoms of UTI.  Of note, pt was recently diagnosed with vulvar cancer.  Patient is following up with OB/GYN at [facility].  Last seen was on 03/24/2022.  Per clinic note,  "the lesion is too large to remove with surgery so the recommendation is to proceed with radiation with chemotherapy. A referral to radiation oncology has been placed. You will need to schedule a PET CT scan to evaluate any spread of your cancer".   Data Reviewed and ED Course: pt was found to have WBC 5.3, negative COVID PCR, electrolytes renal function okay, calcium 10.4, temperature normal, heart rate 78, RR 19, oxygen  saturation 95% on room air.  CT of head is negative.  Patient is placed on PCU as observation   EKG: I have personally reviewed.  Sinus rhythm, QTc 464, right bundle blockade, early R wave progression Q wave in lead III.   Review of Systems:   General: no fevers, chills, no body weight gain, has fatigue HEENT: no blurry vision, hearing changes or sore throat. Has HA. Respiratory: no dyspnea, coughing, wheezing CV: no chest pain, no palpitations GI: has nausea, no vomiting, abdominal pain, diarrhea, constipation GU: no dysuria, burning on urination, increased urinary frequency, hematuria  Ext: no leg edema Neuro: no unilateral weakness, numbness, or tingling, no vision change or hearing loss Skin: no rash, no skin tear. MSK: No muscle spasm, no deformity, no limitation of range of movement in spin Heme: No easy bruising.  Travel history: No recent long distant travel.   Allergy:  Allergies  Allergen Reactions   Ace Inhibitors Cough   Levofloxacin    Penicillin G Other (See Comments)   Tape Other (See Comments)    Other reaction(s): Other (See Comments)   Amoxicillin Rash    Has patient had a PCN reaction causing immediate rash, facial/tongue/throat swelling, SOB or lightheadedness with hypotension: No Has patient had a PCN reaction causing severe rash involving mucus membranes or skin necrosis: No Has patient had a PCN reaction that required hospitalization: No Has patient had a PCN reaction occurring within the last 10 years: No If all of the above answers are "NO", then may proceed with  Cephalosporin use.    Benicar [Olmesartan] Rash    Itching rash   Codeine Rash   Codeine Sulfate Rash   Levaquin [Levofloxacin In D5w] Rash    Alters mental status   Penicillin V Potassium Rash    Has patient had a PCN reaction causing immediate rash, facial/tongue/throat swelling, SOB or lightheadedness with hypotension: No Has patient had a PCN reaction causing severe rash involving  mucus membranes or skin necrosis: No Has patient had a PCN reaction that required hospitalization: No Has patient had a PCN reaction occurring within the last 10 years: No If all of the above answers are "NO", then may proceed with Cephalosporin use.    Sulfa Antibiotics Rash    Past Medical History:  Diagnosis Date   Anxiety    Arthritis    Cancer ([place])    Chronic kidney disease    Constipation    GERD (gastroesophageal reflux disease)    History of DVT (deep vein thrombosis) 07/27/2019   History of pulmonary embolism 07/14/2019   Bilateral   Hypertension    Hypothyroidism    Osteoporosis    Sleep apnea    Urinary incontinence     Past Surgical History:  Procedure Laterality Date   BREAST BIOPSY Right    neg/stereo   BREAST BIOPSY Left    neg/stereo   BREAST EXCISIONAL BIOPSY Right    neg   CATARACT EXTRACTION, BILATERAL     COLONOSCOPY WITH PROPOFOL N/A 07/02/2015   Procedure: COLONOSCOPY WITH PROPOFOL;  Surgeon: Manya Silvas, MD;  Location: [place];  Service: Endoscopy;  Laterality: N/A;   CORRECTION HAMMER TOE Right    ESOPHAGOGASTRODUODENOSCOPY (EGD) WITH PROPOFOL N/A 07/02/2015   Procedure: ESOPHAGOGASTRODUODENOSCOPY (EGD) WITH PROPOFOL;  Surgeon: Manya Silvas, MD;  Location: [facility];  Service: Endoscopy;  Laterality: N/A;   JOINT REPLACEMENT Left    knee   REPLACEMENT TOTAL KNEE Left    SKIN BIOPSY Right     Social History:  reports that she has never smoked. She has never used smokeless tobacco. She reports that she does not drink alcohol and does not use drugs.  Family History:  Family History  Problem Relation Age of Onset   Hypertension Mother    Diabetes Mother    Heart attack Mother    Asthma Mother    Peripheral vascular disease Mother    Heart attack Father    Hypertension Father    Diabetes Father    Colon cancer Maternal Aunt    Breast cancer Cousin      Prior to Admission medications   Medication Sig Start Date End  Date Taking? Authorizing Provider  amLODipine (NORVASC) 2.5 MG tablet Take 1 tablet (2.5 mg total) by mouth daily as needed (for SBP>180). 11/20/21 11/20/22  Merlyn Lot, MD  apixaban (ELIQUIS) 5 MG TABS tablet Take 1 tablet (5 mg total) by mouth 2 (two) times daily. 11/14/21   Barb Merino, MD  aspirin 81 MG chewable tablet Chew 1 tablet (81 mg total) by mouth daily. 12/06/21   Regalado, Belkys A, MD  atorvastatin (LIPITOR) 40 MG tablet Take 40 mg by mouth daily.    [provider]  diclofenac Sodium (VOLTAREN) 1 % GEL Apply 1 application topically daily. Patient not taking: Reported on 12/02/2021 11/08/21   [provider]  HYDROcodone-acetaminophen (NORCO/VICODIN) 5-325 MG tablet Take 1 tablet by mouth every 4 (four) hours. 11/26/21   [provider]  levothyroxine (SYNTHROID, LEVOTHROID) 50 MCG tablet Take 50 mcg  by mouth daily before breakfast.    [provider]  melatonin 3 MG TABS tablet Take 1 tablet by mouth at bedtime as needed. 11/08/21   [provider]  methocarbamol (ROBAXIN) 500 MG tablet Take 500 mg by mouth at bedtime.    [provider]  metoprolol tartrate (LOPRESSOR) 25 MG tablet Take 1 tablet (25 mg total) by mouth in the morning and at bedtime. 11/14/21 02/12/22  Barb Merino, MD  pantoprazole (PROTONIX) 40 MG tablet Take 1 tablet (40 mg total) by mouth 2 (two) times daily. 12/05/21   Regalado, Belkys A, MD  Polyethyl Glycol-Propyl Glycol (SYSTANE OP) Place 1 drop into both eyes 2 (two) times daily.    [provider]  polyethylene glycol powder (GLYCOLAX/MIRALAX) 17 GM/SCOOP powder Take 17 g by mouth daily. 08/03/20   [provider]  senna-docusate (SENOKOT-S) 8.6-50 MG tablet Take 2 tablets by mouth 2 (two) times daily as needed for mild constipation. 04/15/20   Sharen Hones, MD  spironolactone (ALDACTONE) 25 MG tablet Take 25 mg by mouth daily.    [provider]  thiamine 100 MG tablet Take  1 tablet by mouth daily. Patient not taking: Reported on 12/02/2021 11/08/21   [provider]  traZODone (DESYREL) 50 MG tablet Take 50 mg by mouth at bedtime.    [provider]    Physical Exam: Vitals:   05/09/22 1100 05/09/22 1130 05/09/22 1157 05/09/22 1200  BP: (!) 233/80 (!) 208/93 126/60 122/62  Pulse: 70 81 72 63  Resp: '17 12 18 '$ (!) 22  Temp:      TempSrc:      SpO2: 96% 96%  98%  Weight:      Height:       General: Not in acute distress HEENT:       Eyes: PERRL, EOMI, no scleral icterus.       ENT: No discharge from the ears and nose, no pharynx injection, no tonsillar enlargement.        Neck: No JVD, no bruit, no mass felt. Heme: No neck lymph node enlargement. Cardiac: S1/S2, RRR, No murmurs, No gallops or rubs. Respiratory: No rales, wheezing, rhonchi or rubs. GI: Soft, nondistended, nontender, no rebound pain, no organomegaly, BS present. GU: No hematuria Ext: No pitting leg edema bilaterally. 1+DP/PT pulse bilaterally. Musculoskeletal: No joint deformities, No joint redness or warmth, no limitation of ROM in spin. Skin: No rashes.  Neuro: Alert, oriented X3, cranial nerves II-XII grossly intact, moves all extremities normally.  Psych: Patient is not psychotic, no suicidal or hemocidal ideation.  Labs on Admission: I have personally reviewed following labs and imaging studies  CBC: Recent Labs  Lab 05/05/22 1315 05/09/22 0958  WBC 6.4 5.3  NEUTROABS 4.4 3.4  HGB 9.2* 10.5*  HCT 31.5* 35.3*  MCV 81.6 80.6  PLT 323 355   Basic Metabolic Panel: Recent Labs  Lab 05/05/22 1315 05/09/22 0958  NA 137 136  K 4.1 4.6  CL 102 98  CO2 26 28  GLUCOSE 97 101*  BUN 14 10  CREATININE 0.85 0.79  CALCIUM 9.5 10.4*   GFR: Estimated Creatinine Clearance: 51.9 mL/min (by C-G formula based on SCr of 0.79 mg/dL). Liver Function Tests: Recent Labs  Lab 05/09/22 0958  AST 31  ALT 14  ALKPHOS 58  BILITOT 0.8  PROT 8.1  ALBUMIN 4.4   No  results for input(s): "LIPASE", "AMYLASE" in the last 168 hours. No results for input(s): "AMMONIA" in the last  168 hours. Coagulation Profile: No results for input(s): "INR", "PROTIME" in the last 168 hours. Cardiac Enzymes: No results for input(s): "CKTOTAL", "CKMB", "CKMBINDEX", "TROPONINI" in the last 168 hours. BNP (last 3 results) No results for input(s): "PROBNP" in the last 8760 hours. HbA1C: No results for input(s): "HGBA1C" in the last 72 hours. CBG: No results for input(s): "GLUCAP" in the last 168 hours. Lipid Profile: No results for input(s): "CHOL", "HDL", "LDLCALC", "TRIG", "CHOLHDL", "LDLDIRECT" in the last 72 hours. Thyroid Function Tests: No results for input(s): "TSH", "T4TOTAL", "FREET4", "T3FREE", "THYROIDAB" in the last 72 hours. Anemia Panel: No results for input(s): "VITAMINB12", "FOLATE", "FERRITIN", "TIBC", "IRON", "RETICCTPCT" in the last 72 hours. Urine analysis:    Component Value Date/Time   COLORURINE COLORLESS (A) 12/02/2021 1551   APPEARANCEUR CLEAR (A) 12/02/2021 1551   APPEARANCEUR Hazy 07/25/2014 1350   LABSPEC 1.019 12/02/2021 1551   LABSPEC 1.020 07/25/2014 1350   PHURINE 8.0 12/02/2021 1551   GLUCOSEU NEGATIVE 12/02/2021 1551   GLUCOSEU Negative 07/25/2014 1350   HGBUR SMALL (A) 12/02/2021 1551   BILIRUBINUR NEGATIVE 12/02/2021 1551   BILIRUBINUR Negative 07/25/2014 1350   KETONESUR 5 (A) 12/02/2021 1551   PROTEINUR 30 (A) 12/02/2021 1551   NITRITE NEGATIVE 12/02/2021 1551   LEUKOCYTESUR TRACE (A) 12/02/2021 1551   LEUKOCYTESUR 2+ 07/25/2014 1350   Sepsis Labs: '@LABRCNTIP'$ (procalcitonin:4,lacticidven:4) ) Recent Results (from the past 240 hour(s))  SARS Coronavirus 2 by RT PCR (hospital order, performed in [place] hospital lab) *cepheid single result test* Anterior Nasal Swab     Status: None   Collection Time: 05/09/22  9:57 AM   Specimen: Anterior Nasal Swab  Result Value Ref Range Status   SARS Coronavirus 2 by RT PCR NEGATIVE  NEGATIVE Final    Comment: (NOTE) SARS-CoV-2 target nucleic acids are NOT DETECTED.  The SARS-CoV-2 RNA is generally detectable in upper and lower respiratory specimens during the acute phase of infection. The lowest concentration of SARS-CoV-2 viral copies this assay can detect is 250 copies / mL. A negative result does not preclude SARS-CoV-2 infection and should not be used as the sole basis for treatment or other patient management decisions.  A negative result may occur with improper specimen collection / handling, submission of specimen other than nasopharyngeal swab, presence of viral mutation(s) within the areas targeted by this assay, and inadequate number of viral copies (<250 copies / mL). A negative result must be combined with clinical observations, patient history, and epidemiological information.  Fact Sheet for Patients:   https://www.patel.info/  Fact Sheet for Healthcare Providers: https://hall.com/  This test is not yet approved or  cleared by the [place] FDA and has been authorized for detection and/or diagnosis of SARS-CoV-2 by FDA under an Emergency Use Authorization (EUA).  This EUA will remain in effect (meaning this test can be used) for the duration of the COVID-19 declaration under Section 564(b)(1) of the Act, 21 U.S.C. section 360bbb-3(b)(1), unless the authorization is terminated or revoked sooner.  Performed at [facility], [address] [place], [place] [address]      Radiological Exams on Admission: CT Head Wo Contrast  Result Date: 05/09/2022 CLINICAL DATA:  Headache, new or worsening (Age >= 50y) EXAM: CT HEAD WITHOUT CONTRAST TECHNIQUE: Contiguous axial images were obtained from the base of the skull through the vertex without intravenous contrast. RADIATION DOSE REDUCTION: This exam was performed according to the departmental dose-optimization program which includes automated  exposure control, adjustment of the mA and/or kV according  to patient size and/or use of iterative reconstruction technique. COMPARISON:  CT head May 05, 2022. FINDINGS: Brain: No evidence of acute infarction, hemorrhage, hydrocephalus, extra-axial collection or mass lesion/mass effect. Vascular: No hyperdense vessel identified. Skull: No acute fracture. Sinuses/Orbits: Minimal paranasal sinus mucosal thickening. No acute orbital findings. Other: No mastoid effusions. IMPRESSION: No evidence of acute intracranial abnormality. Electronically Signed   By: Margaretha Sheffield M.D.   On: 05/09/2022 10:51      Assessment/Plan Principal Problem:   Hypertensive urgency Active Problems:   Hypothyroidism   Hyperlipidemia   Hypercalcemia   Sleep apnea   Left leg DVT (HCC)   Paroxysmal atrial fibrillation (HCC)   TIA (transient ischemic attack)   History of pulmonary embolism   Headache   Vulvar cancer, carcinoma (HCC)   Principal Problem:   Hypertensive urgency Active Problems:   Hypothyroidism   Hyperlipidemia   Hypercalcemia   Sleep apnea   Left leg DVT (HCC)   Paroxysmal atrial fibrillation (HCC)   TIA (transient ischemic attack)   History of pulmonary embolism   Headache   Vulvar cancer, carcinoma (HCC)   Assessment and Plan: * Hypertensive urgency Blood pressure was 269/96.  Patient received 2 doses of labetalol 20 mg x 2 in ED, blood pressure decreased to 122/62, then comes back to 158/69.   -Placed on PCU for observation -Increase amlodipine dose of from 2.5 mg to 10 mg daily -IV hydralazine as needed -Continue home metoprolol, spironolactone  Vulvar cancer, carcinoma (HCC) - Follow-up with OB/GYN at [facility]  Headache Likely due to hypertension.  CT head negative.  No focal neurodeficits. -As needed Tylenol -Blood pressure control  History of pulmonary embolism - On Eliquis  TIA (transient ischemic attack) - Continue Lipitor -Patient is on Eliquis for  A-fib  Paroxysmal atrial fibrillation (HCC) Heart rate 60-80s - Continue Eliquis and metoprolol  Left leg DVT (HCC) History of DVT and PE: - On Eliquis  Sleep apnea - CPAP  Hypercalcemia Calcium 10.4, this is a chronic issue.  May be due to vulvar cancer -Follow-up with BMP  Hyperlipidemia - Lipitor  Hypothyroidism - Synthroid             DVT ppx:  SQ Lovenox  Code Status: Full code (I discussed with patient about CODE STATUS, explained the meaning of CODE STATUS.  She wants to be full code)  Family Communication:    Yes, patient's  daughter by phone  Disposition Plan:  Anticipate discharge back to previous environment  Consults called:  none  Admission status and Level of care: Progressive:    for obs    Severity of Illness:  The appropriate patient status for this patient is OBSERVATION. Observation status is judged to be reasonable and necessary in order to provide the required intensity of service to ensure the patient's safety. The patient's presenting symptoms, physical exam findings, and initial radiographic and laboratory data in the context of their medical condition is felt to place them at decreased risk for further clinical deterioration. Furthermore, it is anticipated that the patient will be medically stable for discharge from the hospital within 2 midnights of admission.        Date of Service 05/09/2022    Ivor Costa [facility]   If 7PM-7AM, please contact night-coverage [email or web address] 05/09/2022, 1:45 PM

## 2022-05-09 NOTE — Assessment & Plan Note (Signed)
Recent diagnosis.  Patient has significant pain due to this lesion.  This pain may be driving her elevated BP as she does report compliance with antihypertensives.  -Lidocaine-prilocaine cream BID (rx by her oncologist, pt reports helpful) -Continue on home scheduled Norco and as needed oxycodone (patient prefers not to take this due to side effects) - Follow-up with OB/GYN at [facility] -Patient was supposed to have outpatient PET scan so followed by radiation and chemotherapy depending on staging results -MRI brain here given uncontrolled BP and N/V

## 2022-05-09 NOTE — ED Notes (Signed)
Md notified pt's blood pressure is now 126/60 asked if he still wanted amlodipine '10mg'$  since pt had already takes her 2.'5mg'$  dose at home, per md give the 10 mg amlodipine, md given.  Pt reports decreased headache.

## 2022-05-09 NOTE — Assessment & Plan Note (Signed)
Heart rate 60-80s - Continue Eliquis and metoprolol

## 2022-05-09 NOTE — ED Notes (Signed)
Pt in bed, pt reports a headache, pt oriented, states that she is sleepy, pt denies confusion, pt denies any numbness in extremities.

## 2022-05-09 NOTE — Assessment & Plan Note (Signed)
Blood pressure was 269/96.  Patient received 2 doses of labetalol 20 mg x 2 in ED, blood pressure decreased to 122/62, then comes back to 158/69.   -Placed on PCU for observation -Increase amlodipine dose of from 2.5 mg to 10 mg daily -IV hydralazine as needed -Continue home metoprolol, spironolactone

## 2022-05-09 NOTE — Assessment & Plan Note (Signed)
History of DVT and PE: - On Eliquis

## 2022-05-09 NOTE — ED Provider Notes (Signed)
[facility] Provider Note    Event Date/Time   First MD Initiated Contact with Patient 05/09/22 0930     (approximate)   History   Chief Complaint: Hypertension   HPI  Erin Sutton is a 81 y.o. female with a history of hypothyroidism, hypertension, GERD who comes ED complaining of bilateral frontal headache, decreased energy and malaise gradual onset and worsening for the last 2 days.  She was seen in the ED several days ago with headache and uncontrolled high blood pressure.  Denies vision change, paresthesias, motor weakness, syncope.  No chest pain or shortness of breath.  Reports being compliant with her medications.     Physical Exam   Triage Vital Signs: ED Triage Vitals  Enc Vitals Group     BP 05/09/22 0929 (!) 269/96     Pulse Rate 05/09/22 0929 78     Resp 05/09/22 0929 16     Temp 05/09/22 0930 97.9 F (36.6 C)     Temp Source 05/09/22 0930 Oral     SpO2 05/09/22 0929 100 %     Weight 05/09/22 0930 149 lb 14.6 oz (68 kg)     Height 05/09/22 0930 '5\' 3"'$  (1.6 m)     Head Circumference --      Peak Flow --      Pain Score 05/09/22 0929 5     Pain Loc --      Pain Edu? --      Excl. in [place]? --     Most recent vital signs: Vitals:   05/09/22 1000 05/09/22 1055  BP: (!) 257/116 (!) 210/89  Pulse: 75 64  Resp: 19 17  Temp:    SpO2: 95% 97%    General: Awake, no distress.  CV:  Good peripheral perfusion.  Regular rate rhythm.  Normal peripheral pulses Resp:  Normal effort.  Clear to auscultation bilaterally Abd:  No distention.  Soft nontender Other:  PERRL, EOMI, cranial nerves II through XII intact, cerebellar function normal, motor function symmetric   ED Results / Procedures / Treatments   Labs (all labs ordered are listed, but only abnormal results are displayed) Labs Reviewed  COMPREHENSIVE METABOLIC PANEL - Abnormal; Notable for the following components:      Result Value   Glucose, Bld 101 (*)    Calcium 10.4  (*)    All other components within normal limits  CBC WITH DIFFERENTIAL/PLATELET - Abnormal; Notable for the following components:   Hemoglobin 10.5 (*)    HCT 35.3 (*)    MCH 24.0 (*)    MCHC 29.7 (*)    RDW 16.7 (*)    All other components within normal limits  BLOOD GAS, VENOUS - Abnormal; Notable for the following components:   pH, Ven 7.44 (*)    Bicarbonate 34.0 (*)    Acid-Base Excess 8.3 (*)    All other components within normal limits  SARS CORONAVIRUS 2 BY RT PCR     EKG Interpreted by me Sinus rhythm, rate of 72.  Normal axis and intervals.  Right bundle branch block.  No acute ischemic changes.   RADIOLOGY CT head viewed and interpreted by me, negative for ICH.  Radiology report reviewed.   PROCEDURES:  .Critical Care  Performed by: Carrie Mew, MD Authorized by: Carrie Mew, MD   Critical care provider statement:    Critical care time (minutes):  35   Critical care time was exclusive of:  Separately billable procedures and  treating other patients   Critical care was necessary to treat or prevent imminent or life-threatening deterioration of the following conditions:  Circulatory failure and cardiac failure   Critical care was time spent personally by me on the following activities:  Development of treatment plan with patient or surrogate, discussions with consultants, evaluation of patient's response to treatment, examination of patient, obtaining history from patient or surrogate, ordering and performing treatments and interventions, ordering and review of laboratory studies, ordering and review of radiographic studies, pulse oximetry, re-evaluation of patient's condition and review of old charts    MEDICATIONS ORDERED IN ED: Medications  labetalol (NORMODYNE) injection 20 mg (has no administration in time range)  labetalol (NORMODYNE) injection 20 mg (20 mg Intravenous Given 05/09/22 1004)     IMPRESSION / MDM / [place] / ED COURSE   I reviewed the triage vital signs and the nursing notes.                              Differential diagnosis includes, but is not limited to, symptomatic uncontrolled hypertension, intracranial hemorrhage, hypercapnia, COVID, renal insufficiency  Patient's presentation is most consistent with acute presentation with potential threat to life or bodily function.  Patient presents with severe headache which is not thunderclap, severe uncontrolled hypertension.  Serum labs and CT head unremarkable, COVID test negative.  Blood pressure only minimally improved after 20 mg of IV labetalol so a second dose of 20 mg IV labetalol was ordered for initial relief of her markedly elevated blood pressure which was initially 270/100.  Given her repeat ED visit and the degree of blood pressure elevation with symptoms, will plan to hospitalize for acute blood pressure management.  Based on symptoms and exam findings and work-up results, no evidence for vascular dissection, ischemic stroke, meningitis, encephalitis.       FINAL CLINICAL IMPRESSION(S) / ED DIAGNOSES   Final diagnoses:  Severe uncontrolled hypertension     Rx / DC Orders   ED Discharge Orders     None        Note:  This document was prepared using Dragon voice recognition software and may include unintentional dictation errors.   Carrie Mew, MD 05/09/22 [phone]

## 2022-05-09 NOTE — Assessment & Plan Note (Signed)
On Eliquis

## 2022-05-09 NOTE — ED Notes (Signed)
Pt in bed, pt arouses easily to verbal stim, meal tray given, pt reports 3/10 headache, denies confusion, answering questions appropriately, states that she is sleepy

## 2022-05-09 NOTE — Assessment & Plan Note (Signed)
-   Continue Lipitor -Patient is on Eliquis for A-fib

## 2022-05-09 NOTE — ED Triage Notes (Signed)
Pt from home via [facility] with reports that she woke up this am with her BP high. Initally 220/120. A/O x 4 with reports of HA.

## 2022-05-09 NOTE — Assessment & Plan Note (Signed)
Lipitor 

## 2022-05-09 NOTE — ED Notes (Signed)
Oxygen on RA 87%- placed on 2 liter's  which pt says she uses at home at night.

## 2022-05-09 NOTE — Assessment & Plan Note (Signed)
Calcium 10.4, this is a chronic issue.  May be due to vulvar cancer -Follow-up with BMP

## 2022-05-09 NOTE — Assessment & Plan Note (Signed)
CPAP.  

## 2022-05-09 NOTE — Assessment & Plan Note (Signed)
Synthroid 

## 2022-05-09 NOTE — ED Notes (Signed)
Pt provided water at this time. Pt asked if they wished to be woken up for their 4am pain medication and they requested to be allowed to sleep at this time.

## 2022-05-09 NOTE — Assessment & Plan Note (Addendum)
Most likely due to uncontrolled hypertension.  CT head negative.  No focal neurodeficits. --Manage blood pressure as outlined -- On pain medications for vulvar cancer -- Fioricet as needed -- If refractory, IV headache cocktail with Benadryl Toradol Reglan -ordered once as needed for now.   --MRI brain today

## 2022-05-10 ENCOUNTER — Encounter: Payer: Self-pay | Admitting: Internal Medicine

## 2022-05-10 DIAGNOSIS — I160 Hypertensive urgency: Secondary | ICD-10-CM | POA: Diagnosis not present

## 2022-05-10 MED ORDER — DIPHENHYDRAMINE HCL 50 MG/ML IJ SOLN
12.5000 mg | Freq: Once | INTRAMUSCULAR | Status: DC | PRN
Start: 2022-05-10 — End: 2022-05-13

## 2022-05-10 MED ORDER — BUTALBITAL-APAP-CAFFEINE 50-325-40 MG PO TABS
1.0000 | ORAL_TABLET | Freq: Four times a day (QID) | ORAL | Status: DC | PRN
Start: 2022-05-10 — End: 2022-05-13

## 2022-05-10 MED ORDER — MORPHINE SULFATE (PF) 2 MG/ML IV SOLN: 2.0000 mg | INTRAVENOUS | Status: DC | PRN

## 2022-05-10 MED ORDER — METOCLOPRAMIDE HCL 5 MG/ML IJ SOLN: 10.0000 mg | Freq: Once | INTRAMUSCULAR | Status: DC | PRN

## 2022-05-10 MED ORDER — HYDRALAZINE HCL 20 MG/ML IJ SOLN
5.0000 mg | INTRAMUSCULAR | Status: DC | PRN
Start: 2022-05-10 — End: 2022-05-13

## 2022-05-10 MED ORDER — ENSURE ENLIVE PO LIQD
237.0000 mL | Freq: Two times a day (BID) | ORAL | Status: DC
  Administered 2022-05-10 – 2022-05-12 (×6): 237 mL via ORAL

## 2022-05-10 MED ORDER — KETOROLAC TROMETHAMINE 15 MG/ML IJ SOLN: 15.0000 mg | Freq: Once | INTRAMUSCULAR | Status: DC | PRN

## 2022-05-10 NOTE — Progress Notes (Signed)
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  Pt admitted with hypertensive urgency.    Pt unavailable at time of visit. Attempted to speak with pt via call to hospital room phone, however, unable to reach. RD unable to obtain further nutrition-related history or complete nutrition-focused physical exam at this time.    Pt currently on a heart healthy diet. No meal completion data to assess at this time.   Reviewed wt hx; wt has been stable over the past 6 months.   Medications reviewed and include aldactone.   Labs reviewed: CBGS: 98.    Current diet order is Heart Healthy (liberalize to 2 gram sodium for wider variety of meal selections), patient is consuming approximately n/a% of meals at this time. Labs and medications reviewed.   No nutrition interventions warranted at this time. If nutrition issues arise, please consult RD.   Loistine Chance, RD, LDN, [place] Registered Dietitian II Certified Diabetes Care and Education Specialist Please refer to [facility] for RD and/or RD on-call/weekend/after hours pager

## 2022-05-10 NOTE — Progress Notes (Signed)
Progress Note   Patient: Erin Sutton [id number] DOB: March 05, 1941 DOA: 05/09/2022     0 DOS: the patient was seen and examined on 05/10/2022   Brief hospital course: 81 y.o. 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  Initial BP in the ED was 269/96.  Headache described as bilateral frontal area, constant, dull, moderate in severity and nonradiating.  No other neurologic signs or symptoms. Noncontrast head CT in the ER was negative for any acute findings.  Patient had been seen in the ED on 6/12, also with headache and elevated blood pressure, discharged home after BP was better controlled.  Head CT negative at that time as well.  Admitted for observation and blood pressure management.  Of note, pt was recently diagnosed with vulvar cancer.  Patient is following up with OB/GYN at [facility].  Last seen was on 03/24/2022.  Plan is for upcoming PET CT scan and recommended for radiation and chemotherapy, given lesion is too large for surgical excision.     Assessment and Plan: * Hypertensive urgency Blood pressure was 269/96, with secondary headaches.  Patient states that headaches develop in the setting of uncontrolled BP.  She does have significant vulvar pain secondary to the malignancy which could be elevating the BP.  Treated with IV labetalol in the ED and improved.  BP is labile today, had a significant drop after 10 mg IV hydralazine. -- Reduce IV hydralazine to 5 mg as needed -- Continue home amlodipine, metoprolol and spironolactone -- Control pain is much as possible -- Treat headaches as outlined if persistent despite BP control -- Monitor BP closely and adjust meds as needed  Vulvar cancer, carcinoma (HCC) Recent diagnosis.  Patient has significant pain due to this lesion.  This  pain may be driving her elevated BP as she does report compliance with antihypertensives.  -Continue on home scheduled Norco and as needed oxycodone (patient prefers not to take this due to side effects) - Follow-up with OB[facility] at [facility] -Patient was supposed to have outpatient PET scan so followed by radiation and chemotherapy depending on staging results   Headache Most likely due to uncontrolled hypertension.  CT head negative.  No focal neurodeficits. --Manage blood pressure as outlined -- On pain medications for vulvar cancer -- Fioricet as needed -- If refractory, IV headache cocktail with Benadryl Toradol Reglan -ordered once as needed for now.    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  May be due to vulvar cancer -Follow-up with BMP  Hyperlipidemia - Lipitor  Hypothyroidism - Synthroid        Subjective: Patient seen in the ED this morning holding for bed.  Her blood pressures controlled but she continues to have a severe headache.  She also reports great deal of pain from her vulvar lesion.  We discussed her pain medication regimen with scheduled Norco and as needed oxycodone at home.  She tolerates the Norco okay but prefers to avoid oxycodone if possible due to side effects.  She has noticed that headaches happen after  BP gets quite elevated but can last a while after it is controlled.  She reports compliance with her blood pressure medications.  Physical Exam: Vitals:   05/10/22 1100 05/10/22 1150 05/10/22 1600 05/10/22 1916  BP:  (!) 145/68 138/66 (!) 102/45  Pulse:  82 86 77  Resp:  '17 19 18  '$ Temp: 98.2 F (36.8 C) 97.8 F (36.6 C) 98 F (36.7  C) 98.2 F (36.8 C)  TempSrc:  Oral  Oral  SpO2:  98% 97% 96%  Weight:      Height:       General exam: awake, alert, no acute distress but appears uncomfortable and in pain HEENT: EOMI, squinting eyes at times,moist mucus membranes, hearing grossly normal  Respiratory system: CTAB, no wheezes, rales or rhonchi, normal respiratory effort. Cardiovascular system: normal S1/S2, RRR, no JVD, murmurs, rubs, gallops, no pedal edema.   Gastrointestinal system: soft, NT, ND, no HSM felt, +bowel sounds. Central nervous system: A&O x3.  Cranial nerves grossly intact, no gross focal neurologic deficits, normal speech Extremities: moves all, no edema, normal tone Skin: dry, intact, normal temperature Psychiatry: normal mood, congruent affect, judgement and insight appear normal   Data Reviewed:  Notable labs and studies: Glucose 101, calcium 10.4, hemoglobin 10.5 up from 9.2.  Noncontrast head CT without acute findings  Family Communication: None at bedside  Disposition: Status is: Observation The patient remains OBS appropriate and will d/c before 2 midnights.     At this point, patient has ongoing uncontrolled headache and labile BP requiring as needed IV medications for both.    Planned Discharge Destination: Home    Time spent: 40 minutes  Author: Ezekiel Slocumb, DO 05/10/2022 7:20 PM  For on call review [email or web address].

## 2022-05-10 NOTE — Hospital Course (Addendum)
81 y.o. 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  Initial BP in the ED was 269/96.  Headache described as bilateral frontal area, constant, dull, moderate in severity and nonradiating.  No other neurologic signs or symptoms. Noncontrast head CT in the ER was negative for any acute findings.  Patient had been seen in the ED on 6/12, also with headache and elevated blood pressure, discharged home after BP was better controlled.  Head CT negative at that time as well.  Admitted for observation and blood pressure management.  Of note, pt was recently diagnosed with vulvar cancer.  Patient is following up with OB/GYN at [facility].  Last seen was on 03/24/2022.  Plan is for upcoming PET CT scan and recommended for radiation and chemotherapy, given lesion is too large for surgical excision.

## 2022-05-10 NOTE — ED Notes (Signed)
Pt brief changed at this time 

## 2022-05-10 NOTE — Progress Notes (Signed)
Pt refused to wear CPAP at night, said she can only wear O2 and is very closterphobic.

## 2022-05-11 ENCOUNTER — Inpatient Hospital Stay: Payer: Medicare HMO

## 2022-05-11 DIAGNOSIS — Z7982 Long term (current) use of aspirin: Secondary | ICD-10-CM | POA: Diagnosis not present

## 2022-05-11 DIAGNOSIS — R519 Headache, unspecified: Secondary | ICD-10-CM

## 2022-05-11 DIAGNOSIS — Z96651 Presence of right artificial knee joint: Secondary | ICD-10-CM | POA: Diagnosis present

## 2022-05-11 DIAGNOSIS — Z86718 Personal history of other venous thrombosis and embolism: Secondary | ICD-10-CM | POA: Diagnosis not present

## 2022-05-11 DIAGNOSIS — Z881 Allergy status to other antibiotic agents status: Secondary | ICD-10-CM | POA: Diagnosis not present

## 2022-05-11 DIAGNOSIS — K219 Gastro-esophageal reflux disease without esophagitis: Secondary | ICD-10-CM | POA: Diagnosis present

## 2022-05-11 DIAGNOSIS — Z86711 Personal history of pulmonary embolism: Secondary | ICD-10-CM | POA: Diagnosis not present

## 2022-05-11 DIAGNOSIS — I480 Paroxysmal atrial fibrillation: Secondary | ICD-10-CM | POA: Diagnosis present

## 2022-05-11 DIAGNOSIS — I10 Essential (primary) hypertension: Secondary | ICD-10-CM | POA: Diagnosis present

## 2022-05-11 DIAGNOSIS — N182 Chronic kidney disease, stage 2 (mild): Secondary | ICD-10-CM | POA: Diagnosis present

## 2022-05-11 DIAGNOSIS — E039 Hypothyroidism, unspecified: Secondary | ICD-10-CM | POA: Diagnosis present

## 2022-05-11 DIAGNOSIS — R112 Nausea with vomiting, unspecified: Secondary | ICD-10-CM | POA: Diagnosis present

## 2022-05-11 DIAGNOSIS — Z8673 Personal history of transient ischemic attack (TIA), and cerebral infarction without residual deficits: Secondary | ICD-10-CM | POA: Diagnosis not present

## 2022-05-11 DIAGNOSIS — Z79890 Hormone replacement therapy (postmenopausal): Secondary | ICD-10-CM | POA: Diagnosis not present

## 2022-05-11 DIAGNOSIS — Z20822 Contact with and (suspected) exposure to covid-19: Secondary | ICD-10-CM | POA: Diagnosis present

## 2022-05-11 DIAGNOSIS — G4733 Obstructive sleep apnea (adult) (pediatric): Secondary | ICD-10-CM | POA: Diagnosis present

## 2022-05-11 DIAGNOSIS — Z7901 Long term (current) use of anticoagulants: Secondary | ICD-10-CM | POA: Diagnosis not present

## 2022-05-11 DIAGNOSIS — M810 Age-related osteoporosis without current pathological fracture: Secondary | ICD-10-CM | POA: Diagnosis present

## 2022-05-11 DIAGNOSIS — I129 Hypertensive chronic kidney disease with stage 1 through stage 4 chronic kidney disease, or unspecified chronic kidney disease: Secondary | ICD-10-CM | POA: Diagnosis present

## 2022-05-11 DIAGNOSIS — C519 Malignant neoplasm of vulva, unspecified: Secondary | ICD-10-CM

## 2022-05-11 DIAGNOSIS — M1990 Unspecified osteoarthritis, unspecified site: Secondary | ICD-10-CM | POA: Diagnosis present

## 2022-05-11 DIAGNOSIS — Z79899 Other long term (current) drug therapy: Secondary | ICD-10-CM | POA: Diagnosis not present

## 2022-05-11 DIAGNOSIS — I160 Hypertensive urgency: Secondary | ICD-10-CM | POA: Diagnosis present

## 2022-05-11 DIAGNOSIS — E785 Hyperlipidemia, unspecified: Secondary | ICD-10-CM | POA: Diagnosis present

## 2022-05-11 DIAGNOSIS — I959 Hypotension, unspecified: Secondary | ICD-10-CM | POA: Diagnosis not present

## 2022-05-11 MED ORDER — SODIUM CHLORIDE 0.9 % IV SOLN: INTRAVENOUS | Status: DC

## 2022-05-11 MED ORDER — LORAZEPAM 2 MG/ML IJ SOLN
1.0000 mg | Freq: Once | INTRAMUSCULAR | Status: AC
  Administered 2022-05-11: 1 mg via INTRAVENOUS
  Filled 2022-05-11: qty 1

## 2022-05-11 MED ORDER — POLYETHYLENE GLYCOL 3350 17 G PO PACK
17.0000 g | PACK | Freq: Every day | ORAL | Status: DC
Start: 2022-05-11 — End: 2022-05-13
  Administered 2022-05-11 – 2022-05-12 (×2): 17 g via ORAL
  Filled 2022-05-11 (×2): qty 1

## 2022-05-11 MED ORDER — SENNOSIDES-DOCUSATE SODIUM 8.6-50 MG PO TABS
2.0000 | ORAL_TABLET | Freq: Two times a day (BID) | ORAL | Status: DC
  Administered 2022-05-11 – 2022-05-12 (×3): 2 via ORAL
  Filled 2022-05-11 (×3): qty 2

## 2022-05-11 MED ORDER — LIDOCAINE-PRILOCAINE 2.5-2.5 % EX CREA
TOPICAL_CREAM | Freq: Two times a day (BID) | CUTANEOUS | Status: DC
  Filled 2022-05-11: qty 5

## 2022-05-11 MED ORDER — GADOBUTROL 1 MMOL/ML IV SOLN
6.0000 mL | Freq: Once | INTRAVENOUS | Status: AC | PRN
  Administered 2022-05-11: 6 mL via INTRAVENOUS
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
## 2022-05-11 NOTE — Evaluation (Addendum)
Physical Therapy Evaluation Patient Details Name: Erin Sutton MRN: [id number] DOB: 22-Jul-1941 Today's Date: 05/11/2022  History of Present Illness  81 y.o. 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  Initial BP in the ED was 269/96.  Headache described as bilateral frontal area, constant, dull, moderate in severity and nonradiating.  No other neurologic signs or symptoms.  Clinical Impression  The pt presents this session with Erin great willingness to mobilize with PT. She reports that her pain decreases with mobility, therefore she is excited to move around. Her BP has significant drop without an c/o of s/s consistent with orthostatic hypotension. The pt is highly limited this session d/t sudden onset of n/v. She she demonstrates increased sway with static standing activity as well as shuffling gait during gait assessment/training. The pt does report that nausea is Erin limiting factor for her and therefore she would greatly benefit from continued therapy in order to optimize her functional independence and make an updates to recommendation regarding her mobility.        Recommendations for follow up therapy are one component of Erin multi-disciplinary discharge planning process, led by the attending physician.  Recommendations may be updated based on patient status, additional functional criteria and insurance authorization.  Follow Up Recommendations Home health PT    Assistance Recommended at Discharge PRN  Patient can return home with the following  Erin little help with bathing/dressing/bathroom;Erin little help with walking and/or transfers;Assist for transportation;Assistance with cooking/housework    Equipment Recommendations    Recommendations for Other Services       Functional Status  Assessment Patient has had Erin recent decline in their functional status and demonstrates the ability to make significant improvements in function in Erin reasonable and predictable amount of time.     Precautions / Restrictions Precautions Precautions: Fall Restrictions Weight Bearing Restrictions: No      Mobility  Bed Mobility Overal bed mobility: Needs Assistance Bed Mobility: Supine to Sit, Sit to Supine     Supine to sit: Supervision, HOB elevated Sit to supine: Supervision, HOB elevated        Transfers Overall transfer level: Needs assistance Equipment used: Rolling walker (2 wheels), None Transfers: Sit to/from Stand Sit to Stand: Supervision, Min assist           General transfer comment: sit<>stand without AD x2 (min Erin for steadying). sit<>stand with RW, Supervision.    Ambulation/Gait Ambulation/Gait assistance: Min assist Gait Distance (Feet): 75 Feet Assistive device: Rolling walker (2 wheels) Gait Pattern/deviations: Shuffle, Decreased stride length       General Gait Details: Shuffling gait with c/o nausea and LE weakness.  Stairs            Wheelchair Mobility    Modified Rankin (Stroke Patients Only)       Balance Overall balance assessment: History of Falls, Needs assistance Sitting-balance support: Feet supported, No upper extremity supported Sitting balance-Leahy Scale: Good     Standing balance support: During functional activity, Reliant on assistive device for balance Standing balance-Leahy Scale: Fair Standing balance comment: increased sway with static standing during hand washing.                             Pertinent Vitals/Pain Pain Assessment Pain Assessment: 0-10 Pain Score: 7  Pain Location:  vaginal area Pain Descriptors / Indicators: Sore, Pressure Pain Intervention(s): Monitored during session, Premedicated before session    Home Living Family/patient expects to be discharged to:: Private  residence Living Arrangements: Alone Available Help at Discharge: Neighbor;Available PRN/intermittently Type of Home: Mobile home Home Access: Ramped entrance       Home Layout: One level Home Equipment: Tub bench;BSC/3in1;Grab bars - toilet Additional Comments: uses 3-wheel walker for standing/ambulation    Prior Function Prior Level of Function : Independent/Modified Independent;History of Falls (last six months)             Mobility Comments: 1 fall within the last 6 months with no injury.       Hand Dominance   Dominant Hand: Right    Extremity/Trunk Assessment   Upper Extremity Assessment Upper Extremity Assessment: Overall WFL for tasks assessed    Lower Extremity Assessment Lower Extremity Assessment: Generalized weakness    Cervical / Trunk Assessment Cervical / Trunk Assessment: Normal  Communication   Communication: No difficulties  Cognition Arousal/Alertness: Awake/alert Behavior During Therapy: WFL for tasks assessed/performed Overall Cognitive Status: Within Functional Limits for tasks assessed                                          General Comments      Exercises     Assessment/Plan    PT Assessment Patient needs continued PT services  PT Problem List Decreased strength;Decreased mobility;Decreased balance       PT Treatment Interventions Gait training;Functional mobility training;Therapeutic exercise;Therapeutic activities    PT Goals (Current goals can be found in the Care Plan section)  Acute Rehab PT Goals Patient Stated Goal: "get myself better" PT Goal Formulation: With patient Time For Goal Achievement: 05/25/22 Potential to Achieve Goals: Good    Frequency Min 2X/week     Co-evaluation               AM-PAC PT "6 Clicks" Mobility  Outcome Measure Help needed turning from your back to your side while in Erin flat bed without using bedrails?: Erin Little Help needed moving from lying on your back to  sitting on the side of Erin flat bed without using bedrails?: Erin Little Help needed moving to and from Erin bed to Erin chair (including Erin wheelchair)?: Erin Little Help needed standing up from Erin chair using your arms (e.g., wheelchair or bedside chair)?: Erin Little Help needed to walk in hospital room?: Erin Little Help needed climbing 3-5 steps with Erin railing? : Erin Lot 6 Click Score: 17    End of Session   Activity Tolerance: Treatment limited secondary to medical complications (Comment) (nausea with one episode of vomitting.) Patient left: in bed;with bed alarm set Nurse Communication: Mobility status (request for nausea medication.) PT Visit Diagnosis: Unsteadiness on feet (R26.81);History of falling (Z91.81)    Time: 0825-0901 PT Time Calculation (min) (ACUTE ONLY): 36 min   Charges:   PT Evaluation $PT Eval Moderate Complexity: 1 Mod PT Treatments $Gait Training: 8-22 mins $Therapeutic Activity: 8-22 mins        9:41 AM, 05/11/22 Erin Sutton PT, DPT Physical Therapist - [facility] Erin Sutton 05/11/2022, 9:35 AM

## 2022-05-11 NOTE — Assessment & Plan Note (Addendum)
Unclear cause but concern for central cause (ie PRES in setting of uncontrolled BP, ?metastatic dz). 6/18: Pt reports intermittent chronic nausea, currently much worse than baseline and onset was sudden and positional during PT evaluation.  Not tolerating PO intake. --MRI brain w/wo contrast today  --IV antiemetics PRN --Will start IV fluids for maintenance hydration today,

## 2022-05-11 NOTE — Progress Notes (Signed)
Progress Note   Patient: Erin Sutton DPO:[id number] DOB: 1941/02/15 DOA: 05/09/2022     0 DOS: the patient was seen and examined on 05/11/2022   Brief hospital course: 81 y.o. 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  Initial BP in the ED was 269/96.  Headache described as bilateral frontal area, constant, dull, moderate in severity and nonradiating.  No other neurologic signs or symptoms. Noncontrast head CT in the ER was negative for any acute findings.  Patient had been seen in the ED on 6/12, also with headache and elevated blood pressure, discharged home after BP was better controlled.  Head CT negative at that time as well.  Admitted for observation and blood pressure management.  Of note, pt was recently diagnosed with vulvar cancer.  Patient is following up with OB[facility] at [facility].  Last seen was on 03/24/2022.  Plan is for upcoming PET CT scan and recommended for radiation and chemotherapy, given lesion is too large for surgical excision.     Assessment and Plan: * Hypertensive urgency Blood pressure was 269/96, with secondary headaches.  Patient states that headaches develop in the setting of uncontrolled BP.  She does have significant vulvar pain secondary to the malignancy which could be elevating the BP.  Treated with IV labetalol in the ED and improved.  BP is labile today, had a significant drop after 10 mg IV hydralazine. -- Reduce IV hydralazine to 5 mg as needed -- Continue home amlodipine, metoprolol and spironolactone -- Control pain is much as possible -- Treat headaches as outlined if persistent despite BP control -- Monitor BP closely and adjust meds as needed --MRI brain today to rule out PRES or other central causes of N/V and headaches  Vulvar cancer, carcinoma  (HCC) Recent diagnosis.  Patient has significant pain due to this lesion.  This pain may be driving her elevated BP as she does report compliance with antihypertensives.  -Lidocaine-prilocaine cream BID (rx by her oncologist, pt reports helpful) -Continue on home scheduled Norco and as needed oxycodone (patient prefers not to take this due to side effects) - Follow-up with OB/GYN at [facility] -Patient was supposed to have outpatient PET scan so followed by radiation and chemotherapy depending on staging results -MRI brain here given uncontrolled BP and N/V   Headache Most likely due to uncontrolled hypertension.  CT head negative.  No focal neurodeficits. --Manage blood pressure as outlined -- On pain medications for vulvar cancer -- Fioricet as needed -- If refractory, IV headache cocktail with Benadryl Toradol Reglan -ordered once as needed for now.   --MRI brain today  History of pulmonary embolism - On Eliquis  TIA (transient ischemic attack) - Continue Lipitor -Patient is on Eliquis for A-fib  Paroxysmal atrial fibrillation (HCC) Heart rate 60-80s - Continue Eliquis and metoprolol  Nausea & vomiting Unclear cause but concern for central cause (ie PRES in setting of uncontrolled BP, ?metastatic dz). 6/18: Pt reports intermittent chronic nausea, currently much worse than baseline and onset was sudden and positional during PT evaluation.  Not tolerating PO intake. --MRI brain w/wo contrast today  --IV antiemetics PRN --Will start IV fluids for maintenance hydration today,   Left leg DVT ([place]) History of DVT and PE: - On Eliquis  Sleep apnea - CPAP  Hypercalcemia Calcium 10.4, this is a chronic issue.  May be  due to vulvar cancer -Follow-up with BMP  Hyperlipidemia - Lipitor  Hypothyroidism - Synthroid        Subjective: Patient had sudden onset of nausea with vomiting while working with PT today.  She continues to report headaches although a bit less severe than  yesterday.  She is having great deal of groin pain from her cancers multilesion.  Says her oncologist to prescribe something topical numbing medication that was helpful.  Denies dizziness or lightheadedness or vision changes.  Physical Exam: Vitals:   05/11/22 0035 05/11/22 0759 05/11/22 0926 05/11/22 1148  BP: (!) 160/71 (!) 161/90  (!) 150/71  Pulse: 82 84  86  Resp:  16  18  Temp: 97.9 F (36.6 C) 98.1 F (36.7 C)  98.4 F (36.9 C)  TempSrc: Oral Oral  Oral  SpO2: 100% 100% 95% 98%  Weight:      Height:       General exam: Awake and alert, mildly ill-appearing but no acute distress HEENT: EOMI, clear conjunctiva, dry mucous membranes Respiratory system: Normal respiratory effort.  On room air.. Cardiovascular system: Regular rhythm, no peripheral edema.   Gastrointestinal system: Soft nontender abdomen. Central nervous system: A&O x3.  CNs 2 through 12 intact, normal speech grossly nonfocal exam Extremities: moves all, no edema, normal tone Skin: dry, intact, normal temperature Psychiatry: normal mood, congruent affect, judgement and insight appear normal   Data Reviewed: No new labs today  Family Communication: None at bedside  Disposition: Status is: Inpatient Remains inpatient appropriate because: Severity of illness with nausea vomiting today requiring IV antiemetics, not tolerating p.o. intake, on IV fluids.  Further evaluation underway.      Planned Discharge Destination: Home    Time spent: 40 minutes  Author: Ezekiel Slocumb, DO 05/11/2022 1:39 PM  For on call review [email or web address].

## 2022-05-12 ENCOUNTER — Encounter: Payer: Self-pay | Admitting: Internal Medicine

## 2022-05-12 DIAGNOSIS — I959 Hypotension, unspecified: Secondary | ICD-10-CM | POA: Diagnosis not present

## 2022-05-12 DIAGNOSIS — I160 Hypertensive urgency: Secondary | ICD-10-CM | POA: Diagnosis not present

## 2022-05-12 LAB — CBC
HCT: 28.7 % — ABNORMAL LOW (ref 36.0–46.0)
Hemoglobin: 8.5 g/dL — ABNORMAL LOW (ref 12.0–15.0)
MCH: 24.1 pg — ABNORMAL LOW (ref 26.0–34.0)
MCHC: 29.6 g/dL — ABNORMAL LOW (ref 30.0–36.0)
MCV: 81.3 fL (ref 80.0–100.0)
Platelets: 286 10*3/uL (ref 150–400)
RBC: 3.53 MIL/uL — ABNORMAL LOW (ref 3.87–5.11)
RDW: 16.9 % — ABNORMAL HIGH (ref 11.5–15.5)
WBC: 6.1 10*3/uL (ref 4.0–10.5)
nRBC: 0 % (ref 0.0–0.2)

## 2022-05-12 LAB — HEMOGLOBIN AND HEMATOCRIT, BLOOD
HCT: 31.5 % — ABNORMAL LOW (ref 36.0–46.0)
Hemoglobin: 9.3 g/dL — ABNORMAL LOW (ref 12.0–15.0)

## 2022-05-12 MED ORDER — HYDROCODONE-ACETAMINOPHEN 5-325 MG PO TABS
1.0000 | ORAL_TABLET | ORAL | 0 refills | Status: DC
Start: 2022-05-12 — End: 2023-03-27

## 2022-05-12 MED ORDER — ENSURE ENLIVE PO LIQD: 237.0000 mL | Freq: Two times a day (BID) | ORAL | 12 refills | Status: DC

## 2022-05-12 MED ORDER — AMLODIPINE BESYLATE 5 MG PO TABS
5.0000 mg | ORAL_TABLET | Freq: Every day | ORAL | Status: DC
  Administered 2022-05-12: 5 mg via ORAL
  Filled 2022-05-12: qty 1

## 2022-05-12 MED ORDER — AMLODIPINE BESYLATE 5 MG PO TABS: 5.0000 mg | ORAL_TABLET | Freq: Every day | ORAL | 0 refills | Status: DC

## 2022-05-12 MED ORDER — BUTALBITAL-APAP-CAFFEINE 50-325-40 MG PO TABS: 1.0000 | ORAL_TABLET | Freq: Four times a day (QID) | ORAL | 0 refills | Status: DC | PRN

## 2022-05-12 MED ORDER — ONDANSETRON 4 MG PO TBDP: 4.0000 mg | ORAL_TABLET | Freq: Three times a day (TID) | ORAL | 0 refills | Status: DC | PRN

## 2022-05-12 MED ORDER — TRAZODONE HCL 50 MG PO TABS: 50.0000 mg | ORAL_TABLET | Freq: Every evening | ORAL | Status: DC | PRN

## 2022-05-12 MED ORDER — SODIUM CHLORIDE 0.9 % IV BOLUS
500.0000 mL | Freq: Once | INTRAVENOUS | Status: AC
  Administered 2022-05-12: 500 mL via INTRAVENOUS

## 2022-05-12 MED ORDER — LIDOCAINE-PRILOCAINE 2.5-2.5 % EX CREA: TOPICAL_CREAM | Freq: Two times a day (BID) | CUTANEOUS | 0 refills | Status: DC

## 2022-05-12 NOTE — Progress Notes (Signed)
Physical Therapy Treatment Patient Details Name: Erin Sutton MRN: [id number] DOB: Dec 25, 1940 Today's Date: 05/12/2022   History of Present Illness 81 y.o. 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  Initial BP in the ED was 269/96.  Headache described as bilateral frontal area, constant, dull, moderate in severity and nonradiating.  No other neurologic signs or symptoms.    PT Comments    OOB and completed x 2 laps on unit and bathroom to void with RW and min guard/assist.  She does have balance deficits with L and R imbalances at times but she does state this is her baseline.  Remained in chair after session with needs met.     Recommendations for follow up therapy are one component of a multi-disciplinary discharge planning process, led by the attending physician.  Recommendations may be updated based on patient status, additional functional criteria and insurance authorization.  Follow Up Recommendations  Home health PT     Assistance Recommended at Discharge PRN  Patient can return home with the following A little help with bathing/dressing/bathroom;A little help with walking and/or transfers;Assist for transportation;Assistance with cooking/housework   Equipment Recommendations       Recommendations for Other Services       Precautions / Restrictions Precautions Precautions: Fall Restrictions Weight Bearing Restrictions: No     Mobility  Bed Mobility Overal bed mobility: Needs Assistance Bed Mobility: Supine to Sit, Sit to Supine     Supine to sit: Supervision, HOB elevated          Transfers Overall transfer level: Needs assistance Equipment used: Rolling walker (2 wheels), None Transfers: Sit to/from Stand Sit to Stand: Min assist            General transfer comment: a bit unsteady but she stated it was her baseline    Ambulation/Gait Ambulation/Gait assistance: Min assist, Min guard Gait Distance (Feet): 350 Feet Assistive device: Rolling walker (2 wheels) Gait Pattern/deviations: Step-through pattern, Decreased step length - right, Decreased step length - left Gait velocity: decreased     General Gait Details: x 2 laps with ease today   Stairs             Wheelchair Mobility    Modified Rankin (Stroke Patients Only)       Balance Overall balance assessment: History of Falls, Needs assistance Sitting-balance support: Feet supported, No upper extremity supported Sitting balance-Leahy Scale: Good     Standing balance support: During functional activity, Reliant on assistive device for balance Standing balance-Leahy Scale: Fair                              Cognition Arousal/Alertness: Awake/alert Behavior During Therapy: WFL for tasks assessed/performed Overall Cognitive Status: Within Functional Limits for tasks assessed                                          Exercises      General Comments        Pertinent Vitals/Pain Pain Assessment Pain Assessment: Faces Faces Pain Scale: Hurts little more Pain Location: vaginal area Pain Descriptors / Indicators: Sore, Pressure Pain Intervention(s): Monitored during session, Repositioned    Home Living  Prior Function            PT Goals (current goals can now be found in the care plan section) Progress towards PT goals: Progressing toward goals    Frequency    Min 2X/week      PT Plan      Co-evaluation              AM-PAC PT "6 Clicks" Mobility   Outcome Measure  Help needed turning from your back to your side while in a flat bed without using bedrails?: A Little Help needed moving from lying on your back to sitting on the side of a flat bed without using  bedrails?: A Little Help needed moving to and from a bed to a chair (including a wheelchair)?: A Little Help needed standing up from a chair using your arms (e.g., wheelchair or bedside chair)?: A Little Help needed to walk in hospital room?: A Little Help needed climbing 3-5 steps with a railing? : A Lot 6 Click Score: 17    End of Session   Activity Tolerance: Treatment limited secondary to medical complications (Comment) (nausea with one episode of vomitting.) Patient left: in chair;with call bell/phone within reach Nurse Communication: Mobility status (request for nausea medication.) PT Visit Diagnosis: Unsteadiness on feet (R26.81);History of falling (Z91.81)     Time: 0973-5329 PT Time Calculation (min) (ACUTE ONLY): 12 min  Charges:  $Gait Training: 8-22 mins                   Chesley Noon, PTA 05/12/22, 3:10 PM

## 2022-05-12 NOTE — Plan of Care (Signed)

## 2022-05-12 NOTE — Discharge Summary (Addendum)
Physician Discharge Summary   Patient: Erin Sutton MRN: [id number] DOB: Oct 24, 1941  Admit date:     05/09/2022  Discharge date: 05/21/22  Discharge Physician: Ezekiel Slocumb   PCP: Baxter Hire, MD   Recommendations at discharge:   Follow-up with primary care in 1 to 2 weeks Repeat BMP, CBC, Mg in 1 to 2 weeks Follow-up on blood pressure control Please ensure adequate pain control, as pain seems to be driving elevated blood pressures Follow-up on recurrent nausea Follow-up with oncology as scheduled  Discharge Diagnoses: Active Problems:   Hypothyroidism   Hyperlipidemia   Hypercalcemia   Sleep apnea   Left leg DVT (HCC)   CKD (chronic kidney disease) stage 2, GFR 60-89 ml/min   Paroxysmal atrial fibrillation (HCC)   TIA (transient ischemic attack)   History of pulmonary embolism   Vulvar cancer, carcinoma (HCC)   Hypotension  Principal Problem (Resolved):   Hypertensive urgency Resolved Problems:   Nausea & vomiting   Headache  Hospital Course: 81 y.o. 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  Initial BP in the ED was 269/96.  Headache described as bilateral frontal area, constant, dull, moderate in severity and nonradiating.  No other neurologic signs or symptoms. Noncontrast head CT in the ER was negative for any acute findings.  Patient had been seen in the ED on 6/12, also with headache and elevated blood pressure, discharged home after BP was better controlled.  Head CT negative at that time as well.  Admitted for observation and blood pressure management.  Of note, pt was recently diagnosed with vulvar cancer.  Patient is following up with OB/GYN at [facility].  Last seen was on 03/24/2022.  Plan is for upcoming PET CT scan and recommended for radiation and  chemotherapy, given lesion is too large for surgical excision.    6/19: Patient has soft BPs this morning but denies dizziness lightheadedness.  Not had any headaches or nausea vomiting today.  She asks to be discharged this afternoon or evening because her sister-in-law who she was very close to passed away recently and her funeral is tomorrow.  Denies clinically improved and medically stable for discharge with close primary care follow-up for blood pressure monitoring.  Assessment and Plan: * Hypertensive urgency-resolved as of 05/12/2022 Blood pressure was 269/96, with secondary headaches.  Patient states that headaches develop in the setting of uncontrolled BP.  She does have significant vulvar pain secondary to the malignancy which could be elevating the BP.  Treated with IV labetalol in the ED and improved.   BP since admission has been labile, mildly hypotensive earlier this morning. -- Reduce home amlodipine from 10 to 5 mg -- Continue metoprolol and spironolactone at current doses -- Control pain is much as possible as uncontrolled pain contributes to high blood pressure -- Treat headaches as outlined if persistent despite BP control -- Monitor BP closely and adjust meds as needed --MRI brain 6/18 negative for acute findings, ruling out press syndrome, metastatic disease or other central cause of headaches and nausea vomiting -- Close primary care follow-up recommended -- Home BP monitoring recommended, patient agreeable  Headache-resolved as of 05/12/2022 Most likely due to uncontrolled hypertension.  CT head negative.  No focal neurodeficits. --Manage blood pressure as outlined -- On pain medications for vulvar cancer -- Fioricet as needed (prescription sent)  MRI brain obtained 6/18.  Negative for acute findings, ruling out PRES, metastatic disease.  Nausea & vomiting-resolved as of 05/12/2022 Unclear cause but concern for central cause (ie PRES in setting of uncontrolled BP,  ?metastatic dz). 6/18: Pt reports intermittent chronic nausea, currently much worse than baseline and onset was sudden and positional during PT evaluation.  Not tolerating PO intake. --MRI brain w/wo contrast on 6/18 was negative for any acute findings -- Improved with IV antiemetics PRN --Prescription sent for Zofran ODT -- Given IV fluids for maintenance hydration 6/18-19   Hypotension Patient has had labile BPs since hypertensive urgency resolved.  Amlodipine dose has been reduced.  Patient is asymptomatic, no dizziness lightheadedness. -- Patient given hold parameters for antihypertensives with discharge instructions -- Given IV fluids yesterday and today -- Primary care follow-up -- Monitor BP at home closely, patient agreeable  Vulvar cancer, carcinoma ([place]) Recent diagnosis.  Patient has significant pain due to this lesion.  This pain may be driving her elevated BP as she does report compliance with antihypertensives.  -Lidocaine-prilocaine cream BID (rx by her oncologist, pt reports helpful) -Continue on home scheduled Norco and as needed oxycodone (patient prefers not to take this due to side effects) - Follow-up with [facility] at [facility] -Patient was supposed to have outpatient PET scan so followed by radiation and chemotherapy depending on staging results -MRI brain here given uncontrolled BP and N/V   History of pulmonary embolism - On Eliquis  TIA (transient ischemic attack) - Continue Lipitor -Patient is on Eliquis for A-fib  Paroxysmal atrial fibrillation (HCC) Heart rate 60-80s - Continue Eliquis and metoprolol  Left leg DVT ([place]) History of DVT and PE: - On Eliquis  Sleep apnea - CPAP  Hypercalcemia Calcium 10.4, this is a chronic issue.  May be due to vulvar cancer -Follow-up with BMP  Hyperlipidemia - Lipitor  Hypothyroidism - Synthroid         Consultants: None Procedures performed: None  Disposition: Home health  Diet recommendation:   Discharge Diet Orders (From admission, onward)     Start     Ordered   05/12/22 0000  Diet - low sodium heart healthy        05/12/22 1335           Cardiac diet DISCHARGE MEDICATION: Allergies as of 05/12/2022       Reactions   Ace Inhibitors Cough   Levofloxacin    Penicillin G Other (See Comments)   Tape Other (See Comments)   Other reaction(s): Other (See Comments)   Amoxicillin Rash   Has patient had a PCN reaction causing immediate rash, facial/tongue/throat swelling, SOB or lightheadedness with hypotension: No Has patient had a PCN reaction causing severe rash involving mucus membranes or skin necrosis: No Has patient had a PCN reaction that required hospitalization: No Has patient had a PCN reaction occurring within the last 10 years: No If all of the above answers are "NO", then may proceed with Cephalosporin use.   Benicar [olmesartan] Rash   Itching rash   Codeine Rash   Codeine Sulfate Rash   Levaquin [levofloxacin In D5w] Rash   Alters mental status   Penicillin V Potassium Rash   Has patient had a PCN reaction causing immediate rash, facial/tongue/throat swelling, SOB or lightheadedness with hypotension: No Has patient had a PCN reaction causing severe rash involving mucus membranes or skin necrosis: No Has patient had a PCN reaction that required hospitalization: No Has patient had a PCN reaction occurring within  the last 10 years: No If all of the above answers are "NO", then may proceed with Cephalosporin use.   Sulfa Antibiotics Rash        Medication List     STOP taking these medications    diclofenac Sodium 1 % Gel Commonly known as: VOLTAREN   methocarbamol 500 MG tablet Commonly known as: ROBAXIN   thiamine 100 MG tablet       TAKE these medications    amLODipine 5 MG tablet Commonly known as: NORVASC Take 1 tablet (5 mg total) by mouth daily. What changed:  medication strength how much to take when to take this reasons to  take this   apixaban 5 MG Tabs tablet Commonly known as: ELIQUIS Take 1 tablet (5 mg total) by mouth 2 (two) times daily.   atorvastatin 40 MG tablet Commonly known as: LIPITOR Take 40 mg by mouth daily.   butalbital-acetaminophen-caffeine 50-325-40 MG tablet Commonly known as: FIORICET Take 1 tablet by mouth every 6 (six) hours as needed for headache.   DULoxetine 30 MG capsule Commonly known as: CYMBALTA Take 30 mg by mouth daily.   feeding supplement Liqd Take 237 mLs by mouth 2 (two) times daily between meals.   HYDROcodone-acetaminophen 5-325 MG tablet Commonly known as: NORCO/VICODIN Take 1 tablet by mouth every 4 (four) hours.   levothyroxine 50 MCG tablet Commonly known as: SYNTHROID Take 50 mcg by mouth daily before breakfast.   lidocaine-prilocaine cream Commonly known as: EMLA Apply topically 2 (two) times daily.   metoprolol succinate 25 MG 24 hr tablet Commonly known as: TOPROL-XL Take 25 mg by mouth at bedtime.   ondansetron 4 MG disintegrating tablet Commonly known as: ZOFRAN-ODT Take 1 tablet (4 mg total) by mouth every 8 (eight) hours as needed for nausea or vomiting.   oxyCODONE 5 MG immediate release tablet Commonly known as: Oxy IR/ROXICODONE Take 5 mg by mouth 4 (four) times daily as needed for pain.   pantoprazole 40 MG tablet Commonly known as: PROTONIX Take 1 tablet (40 mg total) by mouth 2 (two) times daily.   polyethylene glycol powder 17 GM/SCOOP powder Commonly known as: GLYCOLAX/MIRALAX Take 17 g by mouth daily.   senna-docusate 8.6-50 MG tablet Commonly known as: Senokot-S Take 2 tablets by mouth 2 (two) times daily as needed for mild constipation.   spironolactone 25 MG tablet Commonly known as: ALDACTONE Take 12.5 mg by mouth daily.   SYSTANE OP Place 1 drop into both eyes 2 (two) times daily.   traZODone 50 MG tablet Commonly known as: DESYREL Take 1 tablet (50 mg total) by mouth at bedtime as needed for sleep. What  changed:  when to take this reasons to take this        Follow-up Information     Baxter Hire, MD .   Specialty: Internal Medicine Why: appointment 05/16/22 @ 2pm Dr. Karlene Einstein information: [place] [id number] [phone]                Discharge Exam: Danley Danker Weights   05/09/22 0930  Weight: 68 kg   General exam: awake, alert, no acute distress HEENT: atraumatic, clear conjunctiva, anicteric sclera, moist mucus membranes, hearing grossly normal  Respiratory system: CTAB, no wheezes, rales or rhonchi, normal respiratory effort. Cardiovascular system: normal S1/S2, RRR, no JVD, murmurs, rubs, gallops, no pedal edema.   Gastrointestinal system: soft, NT, ND, no HSM felt, +bowel sounds. Central nervous system: A&O x3. no gross focal neurologic  deficits, normal speech Extremities: moves all, no edema, normal tone Skin: dry, intact, normal temperature, normal color, No rashes, lesions or ulcers Psychiatry: normal mood, congruent affect, judgement and insight appear normal   Condition at discharge: stable  The results of significant diagnostics from this hospitalization (including imaging, microbiology, ancillary and laboratory) are listed below for reference.   Imaging Studies: MR BRAIN W WO CONTRAST  Result Date: 05/11/2022 CLINICAL DATA:  Headache, new or worsening.  Vulvar carcinoma. EXAM: MRI HEAD WITHOUT AND WITH CONTRAST TECHNIQUE: Multiplanar, multiecho pulse sequences of the brain and surrounding structures were obtained without and with intravenous contrast. CONTRAST:  83m GADAVIST GADOBUTROL 1 MMOL/ML IV SOLN COMPARISON:  CT head without contrast 05/09/2022 and 05/05/2022. FINDINGS: Brain: No acute infarct, hemorrhage, or mass lesion is present. Mild periventricular were T2 hyperintensity is likely within normal limits for age. The ventricles are of normal size. A remote cortical infarct is present in the medial posterior left  parietal lobe on image 17 of the T2 weighted images in image 36 of the axial FLAIR images. A remote lacunar infarct is present in the left thalamus No significant extraaxial fluid collection is present. The internal auditory canals are within normal limits. The brainstem and cerebellum are within normal limits. Postcontrast images demonstrate no pathologic enhancement. Vascular: Flow is present in the major intracranial arteries. Skull and upper cervical spine: The craniocervical junction is normal. Upper cervical spine is within normal limits. Marrow signal is unremarkable. Sinuses/Orbits: The paranasal sinuses and mastoid air cells are clear. Bilateral lens replacements are noted. Globes and orbits are otherwise unremarkable. IMPRESSION: 1. No acute intracranial abnormality or evidence for metastatic disease to the brain. 2. 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 Electronically Signed   By: CSan MorelleM.D.   On: 05/11/2022 19:05   CT Head Wo Contrast  Result Date: 05/09/2022 CLINICAL DATA:  Headache, new or worsening (Age >= 50y) EXAM: CT HEAD WITHOUT CONTRAST TECHNIQUE: Contiguous axial images were obtained from the base of the skull through the vertex without intravenous contrast. RADIATION DOSE REDUCTION: This exam was performed according to the departmental dose-optimization program which includes automated exposure control, adjustment of the mA and/or kV according to patient size and/or use of iterative reconstruction technique. COMPARISON:  CT head May 05, 2022. FINDINGS: Brain: No evidence of acute infarction, hemorrhage, hydrocephalus, extra-axial collection or mass lesion/mass effect. Vascular: No hyperdense vessel identified. Skull: No acute fracture. Sinuses/Orbits: Minimal paranasal sinus mucosal thickening. No acute orbital findings. Other: No mastoid effusions. IMPRESSION: No evidence of acute intracranial abnormality. Electronically Signed   By: FMargaretha SheffieldM.D.   On: 05/09/2022 10:51   CT HEAD WO CONTRAST (5MM)  Result Date: 05/05/2022 CLINICAL DATA:  Headache, sudden, severe EXAM: CT HEAD WITHOUT CONTRAST TECHNIQUE: Contiguous axial images were obtained from the base of the skull through the vertex without intravenous contrast. RADIATION DOSE REDUCTION: This exam was performed according to the departmental dose-optimization program which includes automated exposure control, adjustment of the mA and/or kV according to patient size and/or use of iterative reconstruction technique. COMPARISON:  CT head 12/08/2021. FINDINGS: Brain: No evidence of acute infarction, hemorrhage, hydrocephalus, extra-axial collection or mass lesion/mass effect. Vascular: No hyperdense vessel identified. Calcific intracranial atherosclerosis. Skull: No acute fracture. Sinuses/Orbits: Clear sinuses.  No acute orbital findings. Other: No mastoid effusions. IMPRESSION: No evidence of acute intracranial abnormality. Electronically Signed   By: FMargaretha SheffieldM.D.   On: 05/05/2022 14:26    Microbiology: Results  for orders placed or performed during the hospital encounter of 05/09/22  SARS Coronavirus 2 by RT PCR (hospital order, performed in [facility] hospital lab) *cepheid single result test* Anterior Nasal Swab     Status: None   Collection Time: 05/09/22  9:57 AM   Specimen: Anterior Nasal Swab  Result Value Ref Range Status   SARS Coronavirus 2 by RT PCR NEGATIVE NEGATIVE Final    Comment: (NOTE) SARS-CoV-2 target nucleic acids are NOT DETECTED.  The SARS-CoV-2 RNA is generally detectable in upper and lower respiratory specimens during the acute phase of infection. The lowest concentration of SARS-CoV-2 viral copies this assay can detect is 250 copies / mL. A negative result does not preclude SARS-CoV-2 infection and should not be used as the sole basis for treatment or other patient management decisions.  A negative result may occur with improper specimen  collection / handling, submission of specimen other than nasopharyngeal swab, presence of viral mutation(s) within the areas targeted by this assay, and inadequate number of viral copies (<250 copies / mL). A negative result must be combined with clinical observations, patient history, and epidemiological information.  Fact Sheet for Patients:   https://www.patel.info/  Fact Sheet for Healthcare Providers: https://hall.com/  This test is not yet approved or  cleared by the [place] FDA and has been authorized for detection and/or diagnosis of SARS-CoV-2 by FDA under an Emergency Use Authorization (EUA).  This EUA will remain in effect (meaning this test can be used) for the duration of the COVID-19 declaration under Section 564(b)(1) of the Act, 21 U.S.C. section 360bbb-3(b)(1), unless the authorization is terminated or revoked sooner.  Performed at [facility], [address] [place], [place] [address]     Labs: CBC: No results for input(s): "WBC", "NEUTROABS", "HGB", "HCT", "MCV", "PLT" in the last 168 hours.  Basic Metabolic Panel: No results for input(s): "NA", "K", "CL", "CO2", "GLUCOSE", "BUN", "CREATININE", "CALCIUM", "MG", "PHOS" in the last 168 hours.  Liver Function Tests: No results for input(s): "AST", "ALT", "ALKPHOS", "BILITOT", "PROT", "ALBUMIN" in the last 168 hours.  CBG: No results for input(s): "GLUCAP" in the last 168 hours.  Discharge time spent: greater than 30 minutes.  Signed: Ezekiel Slocumb, DO [facility] 05/21/2022

## 2022-05-12 NOTE — TOC Transition Note (Signed)
Transition of Care [facility]) - CM/SW Discharge Note   Patient Details  Name: Erin Sutton MRN: [id number] Date of Birth: 07/16/1941  Transition of [facility] CM/SW Contact:  Laurena Slimmer, RN Phone Number: 05/12/2022, 3:54 PM   Clinical Narrative:    Spoke with patient. Patient stated her daughter was coming to pick her up from . [facility] of [facility] notfied. TOC signing off   Final next level of care: [place] Barriers to Discharge: Continued Medical Work up   Patient Goals and CMS Choice     Choice offered to / list presented to : Patient  Discharge Placement                       Discharge Plan and Services   Discharge Planning Services: CM Consult                      HH Arranged: PT [facility] Agency: [facility] Date [place]: 05/11/22 Time [place]: 1021 Representative spoke with at [place]:  (only able to leave a message with Merleen Nicely)  Social Determinants of Health (SDOH) Interventions     Readmission Risk Interventions     No data to display

## 2022-05-12 NOTE — Assessment & Plan Note (Signed)
Patient has had labile BPs since hypertensive urgency resolved.  Amlodipine dose has been reduced.  Patient is asymptomatic, no dizziness lightheadedness. -- Patient given hold parameters for antihypertensives with discharge instructions -- Given IV fluids yesterday and today -- Primary care follow-up -- Monitor BP at home closely, patient agreeable

## 2022-05-16 DIAGNOSIS — I10 Essential (primary) hypertension: Secondary | ICD-10-CM | POA: Diagnosis not present

## 2022-05-16 DIAGNOSIS — Z09 Encounter for follow-up examination after completed treatment for conditions other than malignant neoplasm: Secondary | ICD-10-CM | POA: Diagnosis not present

## 2022-05-19 DIAGNOSIS — C518 Malignant neoplasm of overlapping sites of vulva: Secondary | ICD-10-CM | POA: Diagnosis not present

## 2022-05-19 DIAGNOSIS — Z79899 Other long term (current) drug therapy: Secondary | ICD-10-CM | POA: Diagnosis not present

## 2022-05-20 DIAGNOSIS — C519 Malignant neoplasm of vulva, unspecified: Secondary | ICD-10-CM | POA: Diagnosis not present

## 2022-05-23 DIAGNOSIS — C519 Malignant neoplasm of vulva, unspecified: Secondary | ICD-10-CM | POA: Diagnosis not present

## 2022-05-29 DIAGNOSIS — I129 Hypertensive chronic kidney disease with stage 1 through stage 4 chronic kidney disease, or unspecified chronic kidney disease: Secondary | ICD-10-CM | POA: Diagnosis not present

## 2022-05-29 DIAGNOSIS — I480 Paroxysmal atrial fibrillation: Secondary | ICD-10-CM | POA: Diagnosis not present

## 2022-05-29 DIAGNOSIS — N189 Chronic kidney disease, unspecified: Secondary | ICD-10-CM | POA: Diagnosis not present

## 2022-05-29 DIAGNOSIS — E039 Hypothyroidism, unspecified: Secondary | ICD-10-CM | POA: Diagnosis not present

## 2022-05-29 DIAGNOSIS — F0283 Dementia in other diseases classified elsewhere, unspecified severity, with mood disturbance: Secondary | ICD-10-CM | POA: Diagnosis not present

## 2022-05-29 DIAGNOSIS — E785 Hyperlipidemia, unspecified: Secondary | ICD-10-CM | POA: Diagnosis not present

## 2022-06-04 DIAGNOSIS — C518 Malignant neoplasm of overlapping sites of vulva: Secondary | ICD-10-CM | POA: Diagnosis not present

## 2022-06-04 DIAGNOSIS — C519 Malignant neoplasm of vulva, unspecified: Secondary | ICD-10-CM | POA: Diagnosis not present

## 2022-06-05 DIAGNOSIS — I10 Essential (primary) hypertension: Secondary | ICD-10-CM | POA: Diagnosis not present

## 2022-06-05 DIAGNOSIS — C519 Malignant neoplasm of vulva, unspecified: Secondary | ICD-10-CM | POA: Diagnosis not present

## 2022-06-05 DIAGNOSIS — Z452 Encounter for adjustment and management of vascular access device: Secondary | ICD-10-CM | POA: Diagnosis not present

## 2022-06-12 DIAGNOSIS — Z5111 Encounter for antineoplastic chemotherapy: Secondary | ICD-10-CM | POA: Diagnosis not present

## 2022-06-12 DIAGNOSIS — I129 Hypertensive chronic kidney disease with stage 1 through stage 4 chronic kidney disease, or unspecified chronic kidney disease: Secondary | ICD-10-CM | POA: Diagnosis not present

## 2022-06-13 DIAGNOSIS — C518 Malignant neoplasm of overlapping sites of vulva: Secondary | ICD-10-CM | POA: Diagnosis not present

## 2022-06-13 DIAGNOSIS — C519 Malignant neoplasm of vulva, unspecified: Secondary | ICD-10-CM | POA: Diagnosis not present

## 2022-06-16 DIAGNOSIS — C519 Malignant neoplasm of vulva, unspecified: Secondary | ICD-10-CM | POA: Diagnosis not present

## 2022-06-16 DIAGNOSIS — I10 Essential (primary) hypertension: Secondary | ICD-10-CM | POA: Diagnosis not present

## 2022-06-16 DIAGNOSIS — C518 Malignant neoplasm of overlapping sites of vulva: Secondary | ICD-10-CM | POA: Diagnosis not present

## 2022-06-17 DIAGNOSIS — I129 Hypertensive chronic kidney disease with stage 1 through stage 4 chronic kidney disease, or unspecified chronic kidney disease: Secondary | ICD-10-CM | POA: Diagnosis not present

## 2022-06-17 DIAGNOSIS — C519 Malignant neoplasm of vulva, unspecified: Secondary | ICD-10-CM | POA: Diagnosis not present

## 2022-06-17 DIAGNOSIS — F419 Anxiety disorder, unspecified: Secondary | ICD-10-CM | POA: Diagnosis not present

## 2022-06-17 DIAGNOSIS — G4730 Sleep apnea, unspecified: Secondary | ICD-10-CM | POA: Diagnosis not present

## 2022-06-17 DIAGNOSIS — C763 Malignant neoplasm of pelvis: Secondary | ICD-10-CM | POA: Diagnosis not present

## 2022-06-17 DIAGNOSIS — I10 Essential (primary) hypertension: Secondary | ICD-10-CM | POA: Diagnosis not present

## 2022-06-17 DIAGNOSIS — E890 Postprocedural hypothyroidism: Secondary | ICD-10-CM | POA: Diagnosis not present

## 2022-06-17 DIAGNOSIS — I4891 Unspecified atrial fibrillation: Secondary | ICD-10-CM | POA: Diagnosis not present

## 2022-06-17 DIAGNOSIS — C518 Malignant neoplasm of overlapping sites of vulva: Secondary | ICD-10-CM | POA: Diagnosis not present

## 2022-06-17 DIAGNOSIS — R519 Headache, unspecified: Secondary | ICD-10-CM | POA: Diagnosis not present

## 2022-06-17 DIAGNOSIS — N189 Chronic kidney disease, unspecified: Secondary | ICD-10-CM | POA: Diagnosis not present

## 2022-06-17 DIAGNOSIS — Z1159 Encounter for screening for other viral diseases: Secondary | ICD-10-CM | POA: Diagnosis not present

## 2022-06-17 DIAGNOSIS — E785 Hyperlipidemia, unspecified: Secondary | ICD-10-CM | POA: Diagnosis not present

## 2022-06-18 DIAGNOSIS — C519 Malignant neoplasm of vulva, unspecified: Secondary | ICD-10-CM | POA: Diagnosis not present

## 2022-06-18 DIAGNOSIS — C518 Malignant neoplasm of overlapping sites of vulva: Secondary | ICD-10-CM | POA: Diagnosis not present

## 2022-06-18 DIAGNOSIS — I10 Essential (primary) hypertension: Secondary | ICD-10-CM | POA: Diagnosis not present

## 2022-06-19 DIAGNOSIS — C519 Malignant neoplasm of vulva, unspecified: Secondary | ICD-10-CM | POA: Diagnosis not present

## 2022-06-19 DIAGNOSIS — C518 Malignant neoplasm of overlapping sites of vulva: Secondary | ICD-10-CM | POA: Diagnosis not present

## 2022-06-19 DIAGNOSIS — I10 Essential (primary) hypertension: Secondary | ICD-10-CM | POA: Diagnosis not present

## 2022-06-20 DIAGNOSIS — C519 Malignant neoplasm of vulva, unspecified: Secondary | ICD-10-CM | POA: Diagnosis not present

## 2022-06-20 DIAGNOSIS — I10 Essential (primary) hypertension: Secondary | ICD-10-CM | POA: Diagnosis not present

## 2022-06-20 DIAGNOSIS — C518 Malignant neoplasm of overlapping sites of vulva: Secondary | ICD-10-CM | POA: Diagnosis not present

## 2022-06-23 DIAGNOSIS — C518 Malignant neoplasm of overlapping sites of vulva: Secondary | ICD-10-CM | POA: Diagnosis not present

## 2022-06-23 DIAGNOSIS — I10 Essential (primary) hypertension: Secondary | ICD-10-CM | POA: Diagnosis not present

## 2022-06-23 DIAGNOSIS — C519 Malignant neoplasm of vulva, unspecified: Secondary | ICD-10-CM | POA: Diagnosis not present

## 2022-06-24 DIAGNOSIS — C518 Malignant neoplasm of overlapping sites of vulva: Secondary | ICD-10-CM | POA: Diagnosis not present

## 2022-06-24 DIAGNOSIS — C763 Malignant neoplasm of pelvis: Secondary | ICD-10-CM | POA: Diagnosis not present

## 2022-06-24 DIAGNOSIS — I10 Essential (primary) hypertension: Secondary | ICD-10-CM | POA: Diagnosis not present

## 2022-06-24 DIAGNOSIS — Z5111 Encounter for antineoplastic chemotherapy: Secondary | ICD-10-CM | POA: Diagnosis not present

## 2022-06-24 DIAGNOSIS — C519 Malignant neoplasm of vulva, unspecified: Secondary | ICD-10-CM | POA: Diagnosis not present

## 2022-06-25 DIAGNOSIS — Z5111 Encounter for antineoplastic chemotherapy: Secondary | ICD-10-CM | POA: Diagnosis not present

## 2022-06-25 DIAGNOSIS — C519 Malignant neoplasm of vulva, unspecified: Secondary | ICD-10-CM | POA: Diagnosis not present

## 2022-06-25 DIAGNOSIS — C763 Malignant neoplasm of pelvis: Secondary | ICD-10-CM | POA: Diagnosis not present

## 2022-06-26 DIAGNOSIS — C518 Malignant neoplasm of overlapping sites of vulva: Secondary | ICD-10-CM | POA: Diagnosis not present

## 2022-06-26 DIAGNOSIS — C763 Malignant neoplasm of pelvis: Secondary | ICD-10-CM | POA: Diagnosis not present

## 2022-06-26 DIAGNOSIS — Z5111 Encounter for antineoplastic chemotherapy: Secondary | ICD-10-CM | POA: Diagnosis not present

## 2022-06-26 DIAGNOSIS — I10 Essential (primary) hypertension: Secondary | ICD-10-CM | POA: Diagnosis not present

## 2022-06-26 DIAGNOSIS — C519 Malignant neoplasm of vulva, unspecified: Secondary | ICD-10-CM | POA: Diagnosis not present

## 2022-06-27 DIAGNOSIS — C519 Malignant neoplasm of vulva, unspecified: Secondary | ICD-10-CM | POA: Diagnosis not present

## 2022-06-27 DIAGNOSIS — Z5111 Encounter for antineoplastic chemotherapy: Secondary | ICD-10-CM | POA: Diagnosis not present

## 2022-06-27 DIAGNOSIS — C763 Malignant neoplasm of pelvis: Secondary | ICD-10-CM | POA: Diagnosis not present

## 2022-06-28 DIAGNOSIS — G893 Neoplasm related pain (acute) (chronic): Secondary | ICD-10-CM | POA: Diagnosis not present

## 2022-06-28 DIAGNOSIS — D649 Anemia, unspecified: Secondary | ICD-10-CM | POA: Diagnosis not present

## 2022-06-28 DIAGNOSIS — M6281 Muscle weakness (generalized): Secondary | ICD-10-CM | POA: Diagnosis not present

## 2022-06-28 DIAGNOSIS — Z86718 Personal history of other venous thrombosis and embolism: Secondary | ICD-10-CM | POA: Diagnosis not present

## 2022-06-28 DIAGNOSIS — F419 Anxiety disorder, unspecified: Secondary | ICD-10-CM | POA: Diagnosis not present

## 2022-06-28 DIAGNOSIS — E785 Hyperlipidemia, unspecified: Secondary | ICD-10-CM | POA: Diagnosis not present

## 2022-06-28 DIAGNOSIS — R52 Pain, unspecified: Secondary | ICD-10-CM | POA: Diagnosis not present

## 2022-06-28 DIAGNOSIS — I129 Hypertensive chronic kidney disease with stage 1 through stage 4 chronic kidney disease, or unspecified chronic kidney disease: Secondary | ICD-10-CM | POA: Diagnosis not present

## 2022-06-28 DIAGNOSIS — Z86711 Personal history of pulmonary embolism: Secondary | ICD-10-CM | POA: Diagnosis not present

## 2022-06-28 DIAGNOSIS — F0394 Unspecified dementia, unspecified severity, with anxiety: Secondary | ICD-10-CM | POA: Diagnosis not present

## 2022-06-28 DIAGNOSIS — C763 Malignant neoplasm of pelvis: Secondary | ICD-10-CM | POA: Diagnosis not present

## 2022-06-28 DIAGNOSIS — F329 Major depressive disorder, single episode, unspecified: Secondary | ICD-10-CM | POA: Diagnosis not present

## 2022-06-28 DIAGNOSIS — N189 Chronic kidney disease, unspecified: Secondary | ICD-10-CM | POA: Diagnosis not present

## 2022-06-28 DIAGNOSIS — R112 Nausea with vomiting, unspecified: Secondary | ICD-10-CM | POA: Diagnosis not present

## 2022-06-28 DIAGNOSIS — D509 Iron deficiency anemia, unspecified: Secondary | ICD-10-CM | POA: Diagnosis not present

## 2022-06-28 DIAGNOSIS — I480 Paroxysmal atrial fibrillation: Secondary | ICD-10-CM | POA: Diagnosis not present

## 2022-06-28 DIAGNOSIS — R531 Weakness: Secondary | ICD-10-CM | POA: Diagnosis not present

## 2022-06-28 DIAGNOSIS — G6282 Radiation-induced polyneuropathy: Secondary | ICD-10-CM | POA: Diagnosis not present

## 2022-06-28 DIAGNOSIS — C518 Malignant neoplasm of overlapping sites of vulva: Secondary | ICD-10-CM | POA: Diagnosis not present

## 2022-06-28 DIAGNOSIS — I10 Essential (primary) hypertension: Secondary | ICD-10-CM | POA: Diagnosis not present

## 2022-06-28 DIAGNOSIS — R627 Adult failure to thrive: Secondary | ICD-10-CM | POA: Diagnosis not present

## 2022-06-28 DIAGNOSIS — E039 Hypothyroidism, unspecified: Secondary | ICD-10-CM | POA: Diagnosis not present

## 2022-06-28 DIAGNOSIS — K5900 Constipation, unspecified: Secondary | ICD-10-CM | POA: Diagnosis not present

## 2022-06-28 DIAGNOSIS — R110 Nausea: Secondary | ICD-10-CM | POA: Diagnosis not present

## 2022-06-28 DIAGNOSIS — R1032 Left lower quadrant pain: Secondary | ICD-10-CM | POA: Diagnosis not present

## 2022-06-28 DIAGNOSIS — R32 Unspecified urinary incontinence: Secondary | ICD-10-CM | POA: Diagnosis not present

## 2022-06-28 DIAGNOSIS — D630 Anemia in neoplastic disease: Secondary | ICD-10-CM | POA: Diagnosis not present

## 2022-06-28 DIAGNOSIS — Z515 Encounter for palliative care: Secondary | ICD-10-CM | POA: Diagnosis not present

## 2022-06-28 DIAGNOSIS — R29810 Facial weakness: Secondary | ICD-10-CM | POA: Diagnosis not present

## 2022-06-28 DIAGNOSIS — C519 Malignant neoplasm of vulva, unspecified: Secondary | ICD-10-CM | POA: Diagnosis not present

## 2022-06-28 DIAGNOSIS — Z7409 Other reduced mobility: Secondary | ICD-10-CM | POA: Diagnosis not present

## 2022-06-29 DIAGNOSIS — R627 Adult failure to thrive: Secondary | ICD-10-CM | POA: Diagnosis not present

## 2022-06-29 DIAGNOSIS — I10 Essential (primary) hypertension: Secondary | ICD-10-CM | POA: Diagnosis not present

## 2022-06-29 DIAGNOSIS — I480 Paroxysmal atrial fibrillation: Secondary | ICD-10-CM | POA: Diagnosis not present

## 2022-06-29 DIAGNOSIS — Z86718 Personal history of other venous thrombosis and embolism: Secondary | ICD-10-CM | POA: Diagnosis not present

## 2022-06-29 DIAGNOSIS — R52 Pain, unspecified: Secondary | ICD-10-CM | POA: Diagnosis not present

## 2022-06-29 DIAGNOSIS — C519 Malignant neoplasm of vulva, unspecified: Secondary | ICD-10-CM | POA: Diagnosis not present

## 2022-06-29 DIAGNOSIS — R110 Nausea: Secondary | ICD-10-CM | POA: Diagnosis not present

## 2022-06-30 DIAGNOSIS — I480 Paroxysmal atrial fibrillation: Secondary | ICD-10-CM | POA: Diagnosis not present

## 2022-06-30 DIAGNOSIS — Z86718 Personal history of other venous thrombosis and embolism: Secondary | ICD-10-CM | POA: Diagnosis not present

## 2022-06-30 DIAGNOSIS — R32 Unspecified urinary incontinence: Secondary | ICD-10-CM | POA: Diagnosis not present

## 2022-06-30 DIAGNOSIS — R110 Nausea: Secondary | ICD-10-CM | POA: Diagnosis not present

## 2022-06-30 DIAGNOSIS — Z86711 Personal history of pulmonary embolism: Secondary | ICD-10-CM | POA: Diagnosis not present

## 2022-06-30 DIAGNOSIS — Z515 Encounter for palliative care: Secondary | ICD-10-CM | POA: Diagnosis not present

## 2022-06-30 DIAGNOSIS — G893 Neoplasm related pain (acute) (chronic): Secondary | ICD-10-CM | POA: Diagnosis not present

## 2022-06-30 DIAGNOSIS — I10 Essential (primary) hypertension: Secondary | ICD-10-CM | POA: Diagnosis not present

## 2022-06-30 DIAGNOSIS — C519 Malignant neoplasm of vulva, unspecified: Secondary | ICD-10-CM | POA: Diagnosis not present

## 2022-06-30 DIAGNOSIS — R627 Adult failure to thrive: Secondary | ICD-10-CM | POA: Diagnosis not present

## 2022-06-30 DIAGNOSIS — K5900 Constipation, unspecified: Secondary | ICD-10-CM | POA: Diagnosis not present

## 2022-07-01 DIAGNOSIS — Z515 Encounter for palliative care: Secondary | ICD-10-CM | POA: Diagnosis not present

## 2022-07-01 DIAGNOSIS — R110 Nausea: Secondary | ICD-10-CM | POA: Diagnosis not present

## 2022-07-01 DIAGNOSIS — I480 Paroxysmal atrial fibrillation: Secondary | ICD-10-CM | POA: Diagnosis not present

## 2022-07-01 DIAGNOSIS — C763 Malignant neoplasm of pelvis: Secondary | ICD-10-CM | POA: Diagnosis not present

## 2022-07-01 DIAGNOSIS — R627 Adult failure to thrive: Secondary | ICD-10-CM | POA: Diagnosis not present

## 2022-07-01 DIAGNOSIS — D649 Anemia, unspecified: Secondary | ICD-10-CM | POA: Diagnosis not present

## 2022-07-01 DIAGNOSIS — C518 Malignant neoplasm of overlapping sites of vulva: Secondary | ICD-10-CM | POA: Diagnosis not present

## 2022-07-01 DIAGNOSIS — C519 Malignant neoplasm of vulva, unspecified: Secondary | ICD-10-CM | POA: Diagnosis not present

## 2022-07-01 DIAGNOSIS — G6282 Radiation-induced polyneuropathy: Secondary | ICD-10-CM | POA: Diagnosis not present

## 2022-07-01 DIAGNOSIS — I10 Essential (primary) hypertension: Secondary | ICD-10-CM | POA: Diagnosis not present

## 2022-07-02 DIAGNOSIS — C763 Malignant neoplasm of pelvis: Secondary | ICD-10-CM | POA: Diagnosis not present

## 2022-07-02 DIAGNOSIS — R627 Adult failure to thrive: Secondary | ICD-10-CM | POA: Diagnosis not present

## 2022-07-02 DIAGNOSIS — Z86711 Personal history of pulmonary embolism: Secondary | ICD-10-CM | POA: Diagnosis not present

## 2022-07-02 DIAGNOSIS — R32 Unspecified urinary incontinence: Secondary | ICD-10-CM | POA: Diagnosis not present

## 2022-07-02 DIAGNOSIS — G6282 Radiation-induced polyneuropathy: Secondary | ICD-10-CM | POA: Diagnosis not present

## 2022-07-02 DIAGNOSIS — D649 Anemia, unspecified: Secondary | ICD-10-CM | POA: Diagnosis not present

## 2022-07-02 DIAGNOSIS — Z515 Encounter for palliative care: Secondary | ICD-10-CM | POA: Diagnosis not present

## 2022-07-02 DIAGNOSIS — R110 Nausea: Secondary | ICD-10-CM | POA: Diagnosis not present

## 2022-07-02 DIAGNOSIS — C518 Malignant neoplasm of overlapping sites of vulva: Secondary | ICD-10-CM | POA: Diagnosis not present

## 2022-07-02 DIAGNOSIS — I10 Essential (primary) hypertension: Secondary | ICD-10-CM | POA: Diagnosis not present

## 2022-07-02 DIAGNOSIS — C519 Malignant neoplasm of vulva, unspecified: Secondary | ICD-10-CM | POA: Diagnosis not present

## 2022-07-02 DIAGNOSIS — I480 Paroxysmal atrial fibrillation: Secondary | ICD-10-CM | POA: Diagnosis not present

## 2022-07-02 DIAGNOSIS — Z86718 Personal history of other venous thrombosis and embolism: Secondary | ICD-10-CM | POA: Diagnosis not present

## 2022-07-03 DIAGNOSIS — G6282 Radiation-induced polyneuropathy: Secondary | ICD-10-CM | POA: Diagnosis not present

## 2022-07-03 DIAGNOSIS — C519 Malignant neoplasm of vulva, unspecified: Secondary | ICD-10-CM | POA: Diagnosis not present

## 2022-07-03 DIAGNOSIS — C518 Malignant neoplasm of overlapping sites of vulva: Secondary | ICD-10-CM | POA: Diagnosis not present

## 2022-07-03 DIAGNOSIS — C763 Malignant neoplasm of pelvis: Secondary | ICD-10-CM | POA: Diagnosis not present

## 2022-07-03 DIAGNOSIS — Z515 Encounter for palliative care: Secondary | ICD-10-CM | POA: Diagnosis not present

## 2022-07-03 DIAGNOSIS — R110 Nausea: Secondary | ICD-10-CM | POA: Diagnosis not present

## 2022-07-04 DIAGNOSIS — E43 Unspecified severe protein-calorie malnutrition: Secondary | ICD-10-CM | POA: Diagnosis not present

## 2022-07-04 DIAGNOSIS — N189 Chronic kidney disease, unspecified: Secondary | ICD-10-CM | POA: Diagnosis not present

## 2022-07-04 DIAGNOSIS — C763 Malignant neoplasm of pelvis: Secondary | ICD-10-CM | POA: Diagnosis not present

## 2022-07-04 DIAGNOSIS — I129 Hypertensive chronic kidney disease with stage 1 through stage 4 chronic kidney disease, or unspecified chronic kidney disease: Secondary | ICD-10-CM | POA: Diagnosis not present

## 2022-07-04 DIAGNOSIS — Z86718 Personal history of other venous thrombosis and embolism: Secondary | ICD-10-CM | POA: Diagnosis not present

## 2022-07-04 DIAGNOSIS — E039 Hypothyroidism, unspecified: Secondary | ICD-10-CM | POA: Diagnosis not present

## 2022-07-04 DIAGNOSIS — Z515 Encounter for palliative care: Secondary | ICD-10-CM | POA: Diagnosis not present

## 2022-07-04 DIAGNOSIS — I10 Essential (primary) hypertension: Secondary | ICD-10-CM | POA: Diagnosis not present

## 2022-07-04 DIAGNOSIS — Z7409 Other reduced mobility: Secondary | ICD-10-CM | POA: Diagnosis not present

## 2022-07-04 DIAGNOSIS — Z86711 Personal history of pulmonary embolism: Secondary | ICD-10-CM | POA: Diagnosis not present

## 2022-07-04 DIAGNOSIS — G893 Neoplasm related pain (acute) (chronic): Secondary | ICD-10-CM | POA: Diagnosis not present

## 2022-07-04 DIAGNOSIS — M6281 Muscle weakness (generalized): Secondary | ICD-10-CM | POA: Diagnosis not present

## 2022-07-04 DIAGNOSIS — F419 Anxiety disorder, unspecified: Secondary | ICD-10-CM | POA: Diagnosis not present

## 2022-07-04 DIAGNOSIS — Z5111 Encounter for antineoplastic chemotherapy: Secondary | ICD-10-CM | POA: Diagnosis not present

## 2022-07-04 DIAGNOSIS — E892 Postprocedural hypoparathyroidism: Secondary | ICD-10-CM | POA: Diagnosis not present

## 2022-07-04 DIAGNOSIS — C518 Malignant neoplasm of overlapping sites of vulva: Secondary | ICD-10-CM | POA: Diagnosis not present

## 2022-07-04 DIAGNOSIS — R627 Adult failure to thrive: Secondary | ICD-10-CM | POA: Diagnosis not present

## 2022-07-04 DIAGNOSIS — D649 Anemia, unspecified: Secondary | ICD-10-CM | POA: Diagnosis not present

## 2022-07-04 DIAGNOSIS — F0390 Unspecified dementia without behavioral disturbance: Secondary | ICD-10-CM | POA: Diagnosis not present

## 2022-07-04 DIAGNOSIS — N3000 Acute cystitis without hematuria: Secondary | ICD-10-CM | POA: Diagnosis not present

## 2022-07-04 DIAGNOSIS — R531 Weakness: Secondary | ICD-10-CM | POA: Diagnosis not present

## 2022-07-04 DIAGNOSIS — C519 Malignant neoplasm of vulva, unspecified: Secondary | ICD-10-CM | POA: Diagnosis not present

## 2022-07-04 DIAGNOSIS — F329 Major depressive disorder, single episode, unspecified: Secondary | ICD-10-CM | POA: Diagnosis not present

## 2022-07-04 DIAGNOSIS — R5381 Other malaise: Secondary | ICD-10-CM | POA: Diagnosis not present

## 2022-07-04 DIAGNOSIS — R110 Nausea: Secondary | ICD-10-CM | POA: Diagnosis not present

## 2022-07-04 DIAGNOSIS — D509 Iron deficiency anemia, unspecified: Secondary | ICD-10-CM | POA: Diagnosis not present

## 2022-07-04 DIAGNOSIS — I480 Paroxysmal atrial fibrillation: Secondary | ICD-10-CM | POA: Diagnosis not present

## 2022-07-04 DIAGNOSIS — Z510 Encounter for antineoplastic radiation therapy: Secondary | ICD-10-CM | POA: Diagnosis not present

## 2022-07-04 DIAGNOSIS — R32 Unspecified urinary incontinence: Secondary | ICD-10-CM | POA: Diagnosis not present

## 2022-07-04 DIAGNOSIS — F32A Depression, unspecified: Secondary | ICD-10-CM | POA: Diagnosis not present

## 2022-07-07 DIAGNOSIS — C763 Malignant neoplasm of pelvis: Secondary | ICD-10-CM | POA: Diagnosis not present

## 2022-07-07 DIAGNOSIS — Z5111 Encounter for antineoplastic chemotherapy: Secondary | ICD-10-CM | POA: Diagnosis not present

## 2022-07-07 DIAGNOSIS — C518 Malignant neoplasm of overlapping sites of vulva: Secondary | ICD-10-CM | POA: Diagnosis not present

## 2022-07-07 DIAGNOSIS — C519 Malignant neoplasm of vulva, unspecified: Secondary | ICD-10-CM | POA: Diagnosis not present

## 2022-07-07 DIAGNOSIS — R5381 Other malaise: Secondary | ICD-10-CM | POA: Diagnosis not present

## 2022-07-08 DIAGNOSIS — C519 Malignant neoplasm of vulva, unspecified: Secondary | ICD-10-CM | POA: Diagnosis not present

## 2022-07-08 DIAGNOSIS — C518 Malignant neoplasm of overlapping sites of vulva: Secondary | ICD-10-CM | POA: Diagnosis not present

## 2022-07-08 DIAGNOSIS — C763 Malignant neoplasm of pelvis: Secondary | ICD-10-CM | POA: Diagnosis not present

## 2022-07-08 DIAGNOSIS — R5381 Other malaise: Secondary | ICD-10-CM | POA: Diagnosis not present

## 2022-07-08 DIAGNOSIS — Z5111 Encounter for antineoplastic chemotherapy: Secondary | ICD-10-CM | POA: Diagnosis not present

## 2022-07-09 DIAGNOSIS — C519 Malignant neoplasm of vulva, unspecified: Secondary | ICD-10-CM | POA: Diagnosis not present

## 2022-07-09 DIAGNOSIS — Z5111 Encounter for antineoplastic chemotherapy: Secondary | ICD-10-CM | POA: Diagnosis not present

## 2022-07-09 DIAGNOSIS — C763 Malignant neoplasm of pelvis: Secondary | ICD-10-CM | POA: Diagnosis not present

## 2022-07-09 DIAGNOSIS — C518 Malignant neoplasm of overlapping sites of vulva: Secondary | ICD-10-CM | POA: Diagnosis not present

## 2022-07-10 DIAGNOSIS — C763 Malignant neoplasm of pelvis: Secondary | ICD-10-CM | POA: Diagnosis not present

## 2022-07-10 DIAGNOSIS — C518 Malignant neoplasm of overlapping sites of vulva: Secondary | ICD-10-CM | POA: Diagnosis not present

## 2022-07-10 DIAGNOSIS — Z5111 Encounter for antineoplastic chemotherapy: Secondary | ICD-10-CM | POA: Diagnosis not present

## 2022-07-10 DIAGNOSIS — C519 Malignant neoplasm of vulva, unspecified: Secondary | ICD-10-CM | POA: Diagnosis not present

## 2022-07-11 DIAGNOSIS — C763 Malignant neoplasm of pelvis: Secondary | ICD-10-CM | POA: Diagnosis not present

## 2022-07-11 DIAGNOSIS — Z5111 Encounter for antineoplastic chemotherapy: Secondary | ICD-10-CM | POA: Diagnosis not present

## 2022-07-11 DIAGNOSIS — C518 Malignant neoplasm of overlapping sites of vulva: Secondary | ICD-10-CM | POA: Diagnosis not present

## 2022-07-11 DIAGNOSIS — C519 Malignant neoplasm of vulva, unspecified: Secondary | ICD-10-CM | POA: Diagnosis not present

## 2022-07-14 DIAGNOSIS — F418 Other specified anxiety disorders: Secondary | ICD-10-CM | POA: Diagnosis not present

## 2022-07-14 DIAGNOSIS — Z7409 Other reduced mobility: Secondary | ICD-10-CM | POA: Diagnosis not present

## 2022-07-14 DIAGNOSIS — C519 Malignant neoplasm of vulva, unspecified: Secondary | ICD-10-CM | POA: Diagnosis not present

## 2022-07-14 DIAGNOSIS — G893 Neoplasm related pain (acute) (chronic): Secondary | ICD-10-CM | POA: Diagnosis not present

## 2022-07-14 DIAGNOSIS — Z86718 Personal history of other venous thrombosis and embolism: Secondary | ICD-10-CM | POA: Diagnosis not present

## 2022-07-14 DIAGNOSIS — R2689 Other abnormalities of gait and mobility: Secondary | ICD-10-CM | POA: Diagnosis not present

## 2022-07-14 DIAGNOSIS — M6281 Muscle weakness (generalized): Secondary | ICD-10-CM | POA: Diagnosis not present

## 2022-07-14 DIAGNOSIS — C763 Malignant neoplasm of pelvis: Secondary | ICD-10-CM | POA: Diagnosis not present

## 2022-07-14 DIAGNOSIS — Z510 Encounter for antineoplastic radiation therapy: Secondary | ICD-10-CM | POA: Diagnosis not present

## 2022-07-14 DIAGNOSIS — N3000 Acute cystitis without hematuria: Secondary | ICD-10-CM | POA: Diagnosis not present

## 2022-07-14 DIAGNOSIS — E892 Postprocedural hypoparathyroidism: Secondary | ICD-10-CM | POA: Diagnosis not present

## 2022-07-14 DIAGNOSIS — Z86711 Personal history of pulmonary embolism: Secondary | ICD-10-CM | POA: Diagnosis not present

## 2022-07-14 DIAGNOSIS — F32A Depression, unspecified: Secondary | ICD-10-CM | POA: Diagnosis not present

## 2022-07-14 DIAGNOSIS — I10 Essential (primary) hypertension: Secondary | ICD-10-CM | POA: Diagnosis not present

## 2022-07-14 DIAGNOSIS — N189 Chronic kidney disease, unspecified: Secondary | ICD-10-CM | POA: Diagnosis not present

## 2022-07-14 DIAGNOSIS — R278 Other lack of coordination: Secondary | ICD-10-CM | POA: Diagnosis not present

## 2022-07-14 DIAGNOSIS — F419 Anxiety disorder, unspecified: Secondary | ICD-10-CM | POA: Diagnosis not present

## 2022-07-14 DIAGNOSIS — K520 Gastroenteritis and colitis due to radiation: Secondary | ICD-10-CM | POA: Diagnosis not present

## 2022-07-14 DIAGNOSIS — R531 Weakness: Secondary | ICD-10-CM | POA: Diagnosis not present

## 2022-07-14 DIAGNOSIS — I480 Paroxysmal atrial fibrillation: Secondary | ICD-10-CM | POA: Diagnosis not present

## 2022-07-14 DIAGNOSIS — R627 Adult failure to thrive: Secondary | ICD-10-CM | POA: Diagnosis not present

## 2022-07-14 DIAGNOSIS — E039 Hypothyroidism, unspecified: Secondary | ICD-10-CM | POA: Diagnosis not present

## 2022-07-14 DIAGNOSIS — C518 Malignant neoplasm of overlapping sites of vulva: Secondary | ICD-10-CM | POA: Diagnosis not present

## 2022-07-14 DIAGNOSIS — R488 Other symbolic dysfunctions: Secondary | ICD-10-CM | POA: Diagnosis not present

## 2022-07-14 DIAGNOSIS — R197 Diarrhea, unspecified: Secondary | ICD-10-CM | POA: Diagnosis not present

## 2022-07-14 DIAGNOSIS — R110 Nausea: Secondary | ICD-10-CM | POA: Diagnosis not present

## 2022-07-14 DIAGNOSIS — Z515 Encounter for palliative care: Secondary | ICD-10-CM | POA: Diagnosis not present

## 2022-07-14 DIAGNOSIS — R5381 Other malaise: Secondary | ICD-10-CM | POA: Diagnosis not present

## 2022-07-14 DIAGNOSIS — F0390 Unspecified dementia without behavioral disturbance: Secondary | ICD-10-CM | POA: Diagnosis not present

## 2022-07-14 DIAGNOSIS — E43 Unspecified severe protein-calorie malnutrition: Secondary | ICD-10-CM | POA: Diagnosis not present

## 2022-07-14 DIAGNOSIS — I129 Hypertensive chronic kidney disease with stage 1 through stage 4 chronic kidney disease, or unspecified chronic kidney disease: Secondary | ICD-10-CM | POA: Diagnosis not present

## 2022-07-15 DIAGNOSIS — C763 Malignant neoplasm of pelvis: Secondary | ICD-10-CM | POA: Diagnosis not present

## 2022-07-15 DIAGNOSIS — C519 Malignant neoplasm of vulva, unspecified: Secondary | ICD-10-CM | POA: Diagnosis not present

## 2022-07-15 DIAGNOSIS — C518 Malignant neoplasm of overlapping sites of vulva: Secondary | ICD-10-CM | POA: Diagnosis not present

## 2022-07-15 DIAGNOSIS — R5381 Other malaise: Secondary | ICD-10-CM | POA: Diagnosis not present

## 2022-07-15 DIAGNOSIS — R627 Adult failure to thrive: Secondary | ICD-10-CM | POA: Diagnosis not present

## 2022-07-16 DIAGNOSIS — R531 Weakness: Secondary | ICD-10-CM | POA: Diagnosis not present

## 2022-07-16 DIAGNOSIS — R110 Nausea: Secondary | ICD-10-CM | POA: Diagnosis not present

## 2022-07-16 DIAGNOSIS — R627 Adult failure to thrive: Secondary | ICD-10-CM | POA: Diagnosis not present

## 2022-07-16 DIAGNOSIS — C519 Malignant neoplasm of vulva, unspecified: Secondary | ICD-10-CM | POA: Diagnosis not present

## 2022-07-17 DIAGNOSIS — F419 Anxiety disorder, unspecified: Secondary | ICD-10-CM | POA: Diagnosis not present

## 2022-07-17 DIAGNOSIS — Z86711 Personal history of pulmonary embolism: Secondary | ICD-10-CM | POA: Diagnosis not present

## 2022-07-17 DIAGNOSIS — R627 Adult failure to thrive: Secondary | ICD-10-CM | POA: Diagnosis not present

## 2022-07-17 DIAGNOSIS — E039 Hypothyroidism, unspecified: Secondary | ICD-10-CM | POA: Diagnosis not present

## 2022-07-17 DIAGNOSIS — F32A Depression, unspecified: Secondary | ICD-10-CM | POA: Diagnosis not present

## 2022-07-17 DIAGNOSIS — I480 Paroxysmal atrial fibrillation: Secondary | ICD-10-CM | POA: Diagnosis not present

## 2022-07-17 DIAGNOSIS — C519 Malignant neoplasm of vulva, unspecified: Secondary | ICD-10-CM | POA: Diagnosis not present

## 2022-07-17 DIAGNOSIS — I10 Essential (primary) hypertension: Secondary | ICD-10-CM | POA: Diagnosis not present

## 2022-07-17 DIAGNOSIS — Z86718 Personal history of other venous thrombosis and embolism: Secondary | ICD-10-CM | POA: Diagnosis not present

## 2022-07-18 DIAGNOSIS — R197 Diarrhea, unspecified: Secondary | ICD-10-CM | POA: Diagnosis not present

## 2022-07-18 DIAGNOSIS — I10 Essential (primary) hypertension: Secondary | ICD-10-CM | POA: Diagnosis not present

## 2022-07-18 DIAGNOSIS — Z86711 Personal history of pulmonary embolism: Secondary | ICD-10-CM | POA: Diagnosis not present

## 2022-07-18 DIAGNOSIS — F419 Anxiety disorder, unspecified: Secondary | ICD-10-CM | POA: Diagnosis not present

## 2022-07-18 DIAGNOSIS — F32A Depression, unspecified: Secondary | ICD-10-CM | POA: Diagnosis not present

## 2022-07-18 DIAGNOSIS — R627 Adult failure to thrive: Secondary | ICD-10-CM | POA: Diagnosis not present

## 2022-07-18 DIAGNOSIS — Z86718 Personal history of other venous thrombosis and embolism: Secondary | ICD-10-CM | POA: Diagnosis not present

## 2022-07-18 DIAGNOSIS — C519 Malignant neoplasm of vulva, unspecified: Secondary | ICD-10-CM | POA: Diagnosis not present

## 2022-07-18 DIAGNOSIS — I480 Paroxysmal atrial fibrillation: Secondary | ICD-10-CM | POA: Diagnosis not present

## 2022-07-20 DIAGNOSIS — R197 Diarrhea, unspecified: Secondary | ICD-10-CM | POA: Diagnosis not present

## 2022-07-20 DIAGNOSIS — F419 Anxiety disorder, unspecified: Secondary | ICD-10-CM | POA: Diagnosis not present

## 2022-07-20 DIAGNOSIS — F32A Depression, unspecified: Secondary | ICD-10-CM | POA: Diagnosis not present

## 2022-07-20 DIAGNOSIS — R627 Adult failure to thrive: Secondary | ICD-10-CM | POA: Diagnosis not present

## 2022-07-20 DIAGNOSIS — I10 Essential (primary) hypertension: Secondary | ICD-10-CM | POA: Diagnosis not present

## 2022-07-20 DIAGNOSIS — I480 Paroxysmal atrial fibrillation: Secondary | ICD-10-CM | POA: Diagnosis not present

## 2022-07-20 DIAGNOSIS — Z86718 Personal history of other venous thrombosis and embolism: Secondary | ICD-10-CM | POA: Diagnosis not present

## 2022-07-20 DIAGNOSIS — Z86711 Personal history of pulmonary embolism: Secondary | ICD-10-CM | POA: Diagnosis not present

## 2022-07-20 DIAGNOSIS — C519 Malignant neoplasm of vulva, unspecified: Secondary | ICD-10-CM | POA: Diagnosis not present

## 2022-07-21 DIAGNOSIS — C518 Malignant neoplasm of overlapping sites of vulva: Secondary | ICD-10-CM | POA: Diagnosis not present

## 2022-07-21 DIAGNOSIS — F32A Depression, unspecified: Secondary | ICD-10-CM | POA: Diagnosis not present

## 2022-07-21 DIAGNOSIS — I480 Paroxysmal atrial fibrillation: Secondary | ICD-10-CM | POA: Diagnosis not present

## 2022-07-21 DIAGNOSIS — R627 Adult failure to thrive: Secondary | ICD-10-CM | POA: Diagnosis not present

## 2022-07-21 DIAGNOSIS — R197 Diarrhea, unspecified: Secondary | ICD-10-CM | POA: Diagnosis not present

## 2022-07-21 DIAGNOSIS — I10 Essential (primary) hypertension: Secondary | ICD-10-CM | POA: Diagnosis not present

## 2022-07-21 DIAGNOSIS — Z86718 Personal history of other venous thrombosis and embolism: Secondary | ICD-10-CM | POA: Diagnosis not present

## 2022-07-21 DIAGNOSIS — N3000 Acute cystitis without hematuria: Secondary | ICD-10-CM | POA: Diagnosis not present

## 2022-07-21 DIAGNOSIS — C519 Malignant neoplasm of vulva, unspecified: Secondary | ICD-10-CM | POA: Diagnosis not present

## 2022-07-21 DIAGNOSIS — Z86711 Personal history of pulmonary embolism: Secondary | ICD-10-CM | POA: Diagnosis not present

## 2022-07-22 DIAGNOSIS — C519 Malignant neoplasm of vulva, unspecified: Secondary | ICD-10-CM | POA: Diagnosis not present

## 2022-07-22 DIAGNOSIS — N3000 Acute cystitis without hematuria: Secondary | ICD-10-CM | POA: Diagnosis not present

## 2022-07-22 DIAGNOSIS — Z86718 Personal history of other venous thrombosis and embolism: Secondary | ICD-10-CM | POA: Diagnosis not present

## 2022-07-22 DIAGNOSIS — C518 Malignant neoplasm of overlapping sites of vulva: Secondary | ICD-10-CM | POA: Diagnosis not present

## 2022-07-22 DIAGNOSIS — Z86711 Personal history of pulmonary embolism: Secondary | ICD-10-CM | POA: Diagnosis not present

## 2022-07-22 DIAGNOSIS — I480 Paroxysmal atrial fibrillation: Secondary | ICD-10-CM | POA: Diagnosis not present

## 2022-07-22 DIAGNOSIS — R627 Adult failure to thrive: Secondary | ICD-10-CM | POA: Diagnosis not present

## 2022-07-22 DIAGNOSIS — I10 Essential (primary) hypertension: Secondary | ICD-10-CM | POA: Diagnosis not present

## 2022-07-22 DIAGNOSIS — F419 Anxiety disorder, unspecified: Secondary | ICD-10-CM | POA: Diagnosis not present

## 2022-07-22 DIAGNOSIS — C763 Malignant neoplasm of pelvis: Secondary | ICD-10-CM | POA: Diagnosis not present

## 2022-07-22 DIAGNOSIS — R197 Diarrhea, unspecified: Secondary | ICD-10-CM | POA: Diagnosis not present

## 2022-07-23 DIAGNOSIS — C518 Malignant neoplasm of overlapping sites of vulva: Secondary | ICD-10-CM | POA: Diagnosis not present

## 2022-07-23 DIAGNOSIS — C763 Malignant neoplasm of pelvis: Secondary | ICD-10-CM | POA: Diagnosis not present

## 2022-07-23 DIAGNOSIS — I480 Paroxysmal atrial fibrillation: Secondary | ICD-10-CM | POA: Diagnosis not present

## 2022-07-23 DIAGNOSIS — R278 Other lack of coordination: Secondary | ICD-10-CM | POA: Diagnosis not present

## 2022-07-23 DIAGNOSIS — M6281 Muscle weakness (generalized): Secondary | ICD-10-CM | POA: Diagnosis not present

## 2022-07-23 DIAGNOSIS — C519 Malignant neoplasm of vulva, unspecified: Secondary | ICD-10-CM | POA: Diagnosis not present

## 2022-07-23 DIAGNOSIS — R5381 Other malaise: Secondary | ICD-10-CM | POA: Diagnosis not present

## 2022-07-23 DIAGNOSIS — Z5111 Encounter for antineoplastic chemotherapy: Secondary | ICD-10-CM | POA: Diagnosis not present

## 2022-07-23 DIAGNOSIS — R2689 Other abnormalities of gait and mobility: Secondary | ICD-10-CM | POA: Diagnosis not present

## 2022-07-23 DIAGNOSIS — Z7409 Other reduced mobility: Secondary | ICD-10-CM | POA: Diagnosis not present

## 2022-07-23 DIAGNOSIS — F0390 Unspecified dementia without behavioral disturbance: Secondary | ICD-10-CM | POA: Diagnosis not present

## 2022-07-23 DIAGNOSIS — I10 Essential (primary) hypertension: Secondary | ICD-10-CM | POA: Diagnosis not present

## 2022-07-23 DIAGNOSIS — N3000 Acute cystitis without hematuria: Secondary | ICD-10-CM | POA: Diagnosis not present

## 2022-07-23 DIAGNOSIS — R488 Other symbolic dysfunctions: Secondary | ICD-10-CM | POA: Diagnosis not present

## 2022-07-23 DIAGNOSIS — Z86718 Personal history of other venous thrombosis and embolism: Secondary | ICD-10-CM | POA: Diagnosis not present

## 2022-07-23 DIAGNOSIS — Z86711 Personal history of pulmonary embolism: Secondary | ICD-10-CM | POA: Diagnosis not present

## 2022-07-23 DIAGNOSIS — R627 Adult failure to thrive: Secondary | ICD-10-CM | POA: Diagnosis not present

## 2022-07-24 DIAGNOSIS — C763 Malignant neoplasm of pelvis: Secondary | ICD-10-CM | POA: Diagnosis not present

## 2022-07-24 DIAGNOSIS — Z5111 Encounter for antineoplastic chemotherapy: Secondary | ICD-10-CM | POA: Diagnosis not present

## 2022-07-24 DIAGNOSIS — C519 Malignant neoplasm of vulva, unspecified: Secondary | ICD-10-CM | POA: Diagnosis not present

## 2022-07-24 DIAGNOSIS — C518 Malignant neoplasm of overlapping sites of vulva: Secondary | ICD-10-CM | POA: Diagnosis not present

## 2022-07-24 DIAGNOSIS — R627 Adult failure to thrive: Secondary | ICD-10-CM | POA: Diagnosis not present

## 2022-07-24 DIAGNOSIS — R5381 Other malaise: Secondary | ICD-10-CM | POA: Diagnosis not present

## 2022-07-25 DIAGNOSIS — R5381 Other malaise: Secondary | ICD-10-CM | POA: Diagnosis not present

## 2022-07-25 DIAGNOSIS — C519 Malignant neoplasm of vulva, unspecified: Secondary | ICD-10-CM | POA: Diagnosis not present

## 2022-07-25 DIAGNOSIS — C763 Malignant neoplasm of pelvis: Secondary | ICD-10-CM | POA: Diagnosis not present

## 2022-07-25 DIAGNOSIS — R627 Adult failure to thrive: Secondary | ICD-10-CM | POA: Diagnosis not present

## 2022-07-25 DIAGNOSIS — C518 Malignant neoplasm of overlapping sites of vulva: Secondary | ICD-10-CM | POA: Diagnosis not present

## 2022-07-29 DIAGNOSIS — C518 Malignant neoplasm of overlapping sites of vulva: Secondary | ICD-10-CM | POA: Diagnosis not present

## 2022-07-29 DIAGNOSIS — R5381 Other malaise: Secondary | ICD-10-CM | POA: Diagnosis not present

## 2022-07-29 DIAGNOSIS — C519 Malignant neoplasm of vulva, unspecified: Secondary | ICD-10-CM | POA: Diagnosis not present

## 2022-07-29 DIAGNOSIS — R627 Adult failure to thrive: Secondary | ICD-10-CM | POA: Diagnosis not present

## 2022-07-29 DIAGNOSIS — C763 Malignant neoplasm of pelvis: Secondary | ICD-10-CM | POA: Diagnosis not present

## 2022-07-30 DIAGNOSIS — C519 Malignant neoplasm of vulva, unspecified: Secondary | ICD-10-CM | POA: Diagnosis not present

## 2022-07-31 DIAGNOSIS — C763 Malignant neoplasm of pelvis: Secondary | ICD-10-CM | POA: Diagnosis not present

## 2022-07-31 DIAGNOSIS — R5381 Other malaise: Secondary | ICD-10-CM | POA: Diagnosis not present

## 2022-07-31 DIAGNOSIS — R627 Adult failure to thrive: Secondary | ICD-10-CM | POA: Diagnosis not present

## 2022-07-31 DIAGNOSIS — C518 Malignant neoplasm of overlapping sites of vulva: Secondary | ICD-10-CM | POA: Diagnosis not present

## 2022-07-31 DIAGNOSIS — C519 Malignant neoplasm of vulva, unspecified: Secondary | ICD-10-CM | POA: Diagnosis not present

## 2022-08-01 DIAGNOSIS — C763 Malignant neoplasm of pelvis: Secondary | ICD-10-CM | POA: Diagnosis not present

## 2022-08-01 DIAGNOSIS — C519 Malignant neoplasm of vulva, unspecified: Secondary | ICD-10-CM | POA: Diagnosis not present

## 2022-08-01 DIAGNOSIS — C518 Malignant neoplasm of overlapping sites of vulva: Secondary | ICD-10-CM | POA: Diagnosis not present

## 2022-08-01 DIAGNOSIS — R627 Adult failure to thrive: Secondary | ICD-10-CM | POA: Diagnosis not present

## 2022-08-04 DIAGNOSIS — C519 Malignant neoplasm of vulva, unspecified: Secondary | ICD-10-CM | POA: Diagnosis not present

## 2022-08-04 DIAGNOSIS — K5900 Constipation, unspecified: Secondary | ICD-10-CM | POA: Diagnosis not present

## 2022-08-04 DIAGNOSIS — F32A Depression, unspecified: Secondary | ICD-10-CM | POA: Diagnosis not present

## 2022-08-04 DIAGNOSIS — F418 Other specified anxiety disorders: Secondary | ICD-10-CM | POA: Diagnosis not present

## 2022-08-04 DIAGNOSIS — N94819 Vulvodynia, unspecified: Secondary | ICD-10-CM | POA: Diagnosis not present

## 2022-08-04 DIAGNOSIS — C763 Malignant neoplasm of pelvis: Secondary | ICD-10-CM | POA: Diagnosis not present

## 2022-08-04 DIAGNOSIS — N189 Chronic kidney disease, unspecified: Secondary | ICD-10-CM | POA: Diagnosis not present

## 2022-08-04 DIAGNOSIS — F419 Anxiety disorder, unspecified: Secondary | ICD-10-CM | POA: Diagnosis not present

## 2022-08-04 DIAGNOSIS — G893 Neoplasm related pain (acute) (chronic): Secondary | ICD-10-CM | POA: Diagnosis not present

## 2022-08-04 DIAGNOSIS — K219 Gastro-esophageal reflux disease without esophagitis: Secondary | ICD-10-CM | POA: Diagnosis not present

## 2022-08-04 DIAGNOSIS — E039 Hypothyroidism, unspecified: Secondary | ICD-10-CM | POA: Diagnosis not present

## 2022-08-04 DIAGNOSIS — G6282 Radiation-induced polyneuropathy: Secondary | ICD-10-CM | POA: Diagnosis not present

## 2022-08-04 DIAGNOSIS — C518 Malignant neoplasm of overlapping sites of vulva: Secondary | ICD-10-CM | POA: Diagnosis not present

## 2022-08-04 DIAGNOSIS — E43 Unspecified severe protein-calorie malnutrition: Secondary | ICD-10-CM | POA: Diagnosis not present

## 2022-08-04 DIAGNOSIS — R300 Dysuria: Secondary | ICD-10-CM | POA: Diagnosis not present

## 2022-08-04 DIAGNOSIS — R627 Adult failure to thrive: Secondary | ICD-10-CM | POA: Diagnosis not present

## 2022-08-04 DIAGNOSIS — Z515 Encounter for palliative care: Secondary | ICD-10-CM | POA: Diagnosis not present

## 2022-08-04 DIAGNOSIS — I129 Hypertensive chronic kidney disease with stage 1 through stage 4 chronic kidney disease, or unspecified chronic kidney disease: Secondary | ICD-10-CM | POA: Diagnosis not present

## 2022-08-04 DIAGNOSIS — I480 Paroxysmal atrial fibrillation: Secondary | ICD-10-CM | POA: Diagnosis not present

## 2022-08-04 DIAGNOSIS — N94818 Other vulvodynia: Secondary | ICD-10-CM | POA: Diagnosis not present

## 2022-08-04 DIAGNOSIS — K520 Gastroenteritis and colitis due to radiation: Secondary | ICD-10-CM | POA: Diagnosis not present

## 2022-08-04 DIAGNOSIS — I10 Essential (primary) hypertension: Secondary | ICD-10-CM | POA: Diagnosis not present

## 2022-08-05 DIAGNOSIS — R627 Adult failure to thrive: Secondary | ICD-10-CM | POA: Diagnosis not present

## 2022-08-05 DIAGNOSIS — C519 Malignant neoplasm of vulva, unspecified: Secondary | ICD-10-CM | POA: Diagnosis not present

## 2022-08-05 DIAGNOSIS — C518 Malignant neoplasm of overlapping sites of vulva: Secondary | ICD-10-CM | POA: Diagnosis not present

## 2022-08-05 DIAGNOSIS — Z515 Encounter for palliative care: Secondary | ICD-10-CM | POA: Diagnosis not present

## 2022-08-05 DIAGNOSIS — G6282 Radiation-induced polyneuropathy: Secondary | ICD-10-CM | POA: Diagnosis not present

## 2022-08-05 DIAGNOSIS — C763 Malignant neoplasm of pelvis: Secondary | ICD-10-CM | POA: Diagnosis not present

## 2022-08-05 DIAGNOSIS — K5900 Constipation, unspecified: Secondary | ICD-10-CM | POA: Diagnosis not present

## 2022-08-06 DIAGNOSIS — C519 Malignant neoplasm of vulva, unspecified: Secondary | ICD-10-CM | POA: Diagnosis not present

## 2022-08-06 DIAGNOSIS — C518 Malignant neoplasm of overlapping sites of vulva: Secondary | ICD-10-CM | POA: Diagnosis not present

## 2022-08-06 DIAGNOSIS — C763 Malignant neoplasm of pelvis: Secondary | ICD-10-CM | POA: Diagnosis not present

## 2022-08-06 DIAGNOSIS — R627 Adult failure to thrive: Secondary | ICD-10-CM | POA: Diagnosis not present

## 2022-08-07 DIAGNOSIS — C763 Malignant neoplasm of pelvis: Secondary | ICD-10-CM | POA: Diagnosis not present

## 2022-08-07 DIAGNOSIS — C518 Malignant neoplasm of overlapping sites of vulva: Secondary | ICD-10-CM | POA: Diagnosis not present

## 2022-08-07 DIAGNOSIS — R627 Adult failure to thrive: Secondary | ICD-10-CM | POA: Diagnosis not present

## 2022-08-07 DIAGNOSIS — C519 Malignant neoplasm of vulva, unspecified: Secondary | ICD-10-CM | POA: Diagnosis not present

## 2022-08-08 DIAGNOSIS — C519 Malignant neoplasm of vulva, unspecified: Secondary | ICD-10-CM | POA: Diagnosis not present

## 2022-08-08 DIAGNOSIS — R627 Adult failure to thrive: Secondary | ICD-10-CM | POA: Diagnosis not present

## 2022-08-08 DIAGNOSIS — C763 Malignant neoplasm of pelvis: Secondary | ICD-10-CM | POA: Diagnosis not present

## 2022-08-08 DIAGNOSIS — C518 Malignant neoplasm of overlapping sites of vulva: Secondary | ICD-10-CM | POA: Diagnosis not present

## 2022-08-11 DIAGNOSIS — R627 Adult failure to thrive: Secondary | ICD-10-CM | POA: Diagnosis not present

## 2022-08-11 DIAGNOSIS — C763 Malignant neoplasm of pelvis: Secondary | ICD-10-CM | POA: Diagnosis not present

## 2022-08-11 DIAGNOSIS — C518 Malignant neoplasm of overlapping sites of vulva: Secondary | ICD-10-CM | POA: Diagnosis not present

## 2022-08-11 DIAGNOSIS — C519 Malignant neoplasm of vulva, unspecified: Secondary | ICD-10-CM | POA: Diagnosis not present

## 2022-08-19 DIAGNOSIS — C519 Malignant neoplasm of vulva, unspecified: Secondary | ICD-10-CM | POA: Diagnosis not present

## 2022-08-19 DIAGNOSIS — R52 Pain, unspecified: Secondary | ICD-10-CM | POA: Diagnosis not present

## 2022-08-19 DIAGNOSIS — K5900 Constipation, unspecified: Secondary | ICD-10-CM | POA: Diagnosis not present

## 2022-08-19 DIAGNOSIS — Z515 Encounter for palliative care: Secondary | ICD-10-CM | POA: Diagnosis not present

## 2022-08-19 DIAGNOSIS — G893 Neoplasm related pain (acute) (chronic): Secondary | ICD-10-CM | POA: Diagnosis not present

## 2022-09-07 DIAGNOSIS — F32A Depression, unspecified: Secondary | ICD-10-CM | POA: Diagnosis not present

## 2022-09-07 DIAGNOSIS — R2681 Unsteadiness on feet: Secondary | ICD-10-CM | POA: Diagnosis not present

## 2022-09-07 DIAGNOSIS — Z8673 Personal history of transient ischemic attack (TIA), and cerebral infarction without residual deficits: Secondary | ICD-10-CM | POA: Diagnosis not present

## 2022-09-07 DIAGNOSIS — K5900 Constipation, unspecified: Secondary | ICD-10-CM | POA: Diagnosis not present

## 2022-09-07 DIAGNOSIS — Z7901 Long term (current) use of anticoagulants: Secondary | ICD-10-CM | POA: Diagnosis not present

## 2022-09-07 DIAGNOSIS — R112 Nausea with vomiting, unspecified: Secondary | ICD-10-CM | POA: Diagnosis not present

## 2022-09-07 DIAGNOSIS — N189 Chronic kidney disease, unspecified: Secondary | ICD-10-CM | POA: Diagnosis not present

## 2022-09-07 DIAGNOSIS — R000 Tachycardia, unspecified: Secondary | ICD-10-CM | POA: Diagnosis not present

## 2022-09-07 DIAGNOSIS — Z923 Personal history of irradiation: Secondary | ICD-10-CM | POA: Diagnosis not present

## 2022-09-07 DIAGNOSIS — R110 Nausea: Secondary | ICD-10-CM | POA: Diagnosis not present

## 2022-09-07 DIAGNOSIS — E43 Unspecified severe protein-calorie malnutrition: Secondary | ICD-10-CM | POA: Diagnosis not present

## 2022-09-07 DIAGNOSIS — G8928 Other chronic postprocedural pain: Secondary | ICD-10-CM | POA: Diagnosis not present

## 2022-09-07 DIAGNOSIS — Z515 Encounter for palliative care: Secondary | ICD-10-CM | POA: Diagnosis not present

## 2022-09-07 DIAGNOSIS — E860 Dehydration: Secondary | ICD-10-CM | POA: Diagnosis not present

## 2022-09-07 DIAGNOSIS — N94818 Other vulvodynia: Secondary | ICD-10-CM | POA: Diagnosis not present

## 2022-09-07 DIAGNOSIS — T66XXXD Radiation sickness, unspecified, subsequent encounter: Secondary | ICD-10-CM | POA: Diagnosis not present

## 2022-09-07 DIAGNOSIS — K5909 Other constipation: Secondary | ICD-10-CM | POA: Diagnosis not present

## 2022-09-07 DIAGNOSIS — R4589 Other symptoms and signs involving emotional state: Secondary | ICD-10-CM | POA: Diagnosis not present

## 2022-09-07 DIAGNOSIS — I10 Essential (primary) hypertension: Secondary | ICD-10-CM | POA: Diagnosis not present

## 2022-09-07 DIAGNOSIS — E440 Moderate protein-calorie malnutrition: Secondary | ICD-10-CM | POA: Diagnosis not present

## 2022-09-07 DIAGNOSIS — K219 Gastro-esophageal reflux disease without esophagitis: Secondary | ICD-10-CM | POA: Diagnosis not present

## 2022-09-07 DIAGNOSIS — F419 Anxiety disorder, unspecified: Secondary | ICD-10-CM | POA: Diagnosis not present

## 2022-09-07 DIAGNOSIS — G908 Other disorders of autonomic nervous system: Secondary | ICD-10-CM | POA: Diagnosis not present

## 2022-09-07 DIAGNOSIS — I129 Hypertensive chronic kidney disease with stage 1 through stage 4 chronic kidney disease, or unspecified chronic kidney disease: Secondary | ICD-10-CM | POA: Diagnosis not present

## 2022-09-07 DIAGNOSIS — G893 Neoplasm related pain (acute) (chronic): Secondary | ICD-10-CM | POA: Diagnosis not present

## 2022-09-07 DIAGNOSIS — T402X5A Adverse effect of other opioids, initial encounter: Secondary | ICD-10-CM | POA: Diagnosis not present

## 2022-09-07 DIAGNOSIS — Z7409 Other reduced mobility: Secondary | ICD-10-CM | POA: Diagnosis not present

## 2022-09-07 DIAGNOSIS — C519 Malignant neoplasm of vulva, unspecified: Secondary | ICD-10-CM | POA: Diagnosis not present

## 2022-09-07 DIAGNOSIS — I480 Paroxysmal atrial fibrillation: Secondary | ICD-10-CM | POA: Diagnosis not present

## 2022-09-07 DIAGNOSIS — E039 Hypothyroidism, unspecified: Secondary | ICD-10-CM | POA: Diagnosis not present

## 2022-09-07 DIAGNOSIS — R52 Pain, unspecified: Secondary | ICD-10-CM | POA: Diagnosis not present

## 2022-09-07 DIAGNOSIS — R102 Pelvic and perineal pain: Secondary | ICD-10-CM | POA: Diagnosis not present

## 2022-09-07 DIAGNOSIS — G6282 Radiation-induced polyneuropathy: Secondary | ICD-10-CM | POA: Diagnosis not present

## 2022-09-22 ENCOUNTER — Encounter (INDEPENDENT_AMBULATORY_CARE_PROVIDER_SITE_OTHER): Payer: Self-pay

## 2022-10-01 DIAGNOSIS — C519 Malignant neoplasm of vulva, unspecified: Secondary | ICD-10-CM | POA: Diagnosis not present

## 2022-10-01 DIAGNOSIS — R102 Pelvic and perineal pain: Secondary | ICD-10-CM | POA: Diagnosis not present

## 2022-10-31 DIAGNOSIS — G8929 Other chronic pain: Secondary | ICD-10-CM | POA: Diagnosis not present

## 2022-10-31 DIAGNOSIS — Z7409 Other reduced mobility: Secondary | ICD-10-CM | POA: Diagnosis not present

## 2022-10-31 DIAGNOSIS — G893 Neoplasm related pain (acute) (chronic): Secondary | ICD-10-CM | POA: Diagnosis not present

## 2022-10-31 DIAGNOSIS — R079 Chest pain, unspecified: Secondary | ICD-10-CM | POA: Diagnosis not present

## 2022-10-31 DIAGNOSIS — R4182 Altered mental status, unspecified: Secondary | ICD-10-CM | POA: Diagnosis not present

## 2022-10-31 DIAGNOSIS — E871 Hypo-osmolality and hyponatremia: Secondary | ICD-10-CM | POA: Diagnosis not present

## 2022-10-31 DIAGNOSIS — N179 Acute kidney failure, unspecified: Secondary | ICD-10-CM | POA: Diagnosis not present

## 2022-10-31 DIAGNOSIS — I10 Essential (primary) hypertension: Secondary | ICD-10-CM | POA: Diagnosis not present

## 2022-10-31 DIAGNOSIS — I6523 Occlusion and stenosis of bilateral carotid arteries: Secondary | ICD-10-CM | POA: Diagnosis not present

## 2022-10-31 DIAGNOSIS — F339 Major depressive disorder, recurrent, unspecified: Secondary | ICD-10-CM | POA: Diagnosis not present

## 2022-10-31 DIAGNOSIS — G909 Disorder of the autonomic nervous system, unspecified: Secondary | ICD-10-CM | POA: Diagnosis not present

## 2022-10-31 DIAGNOSIS — N189 Chronic kidney disease, unspecified: Secondary | ICD-10-CM | POA: Diagnosis not present

## 2022-10-31 DIAGNOSIS — G238 Other specified degenerative diseases of basal ganglia: Secondary | ICD-10-CM | POA: Diagnosis not present

## 2022-10-31 DIAGNOSIS — H532 Diplopia: Secondary | ICD-10-CM | POA: Diagnosis not present

## 2022-10-31 DIAGNOSIS — I674 Hypertensive encephalopathy: Secondary | ICD-10-CM | POA: Diagnosis not present

## 2022-10-31 DIAGNOSIS — W19XXXA Unspecified fall, initial encounter: Secondary | ICD-10-CM | POA: Diagnosis not present

## 2022-10-31 DIAGNOSIS — C519 Malignant neoplasm of vulva, unspecified: Secondary | ICD-10-CM | POA: Diagnosis not present

## 2022-10-31 DIAGNOSIS — R29810 Facial weakness: Secondary | ICD-10-CM | POA: Diagnosis not present

## 2022-10-31 DIAGNOSIS — I2489 Other forms of acute ischemic heart disease: Secondary | ICD-10-CM | POA: Diagnosis not present

## 2022-10-31 DIAGNOSIS — R627 Adult failure to thrive: Secondary | ICD-10-CM | POA: Diagnosis not present

## 2022-10-31 DIAGNOSIS — K7682 Hepatic encephalopathy: Secondary | ICD-10-CM | POA: Diagnosis not present

## 2022-10-31 DIAGNOSIS — R42 Dizziness and giddiness: Secondary | ICD-10-CM | POA: Diagnosis not present

## 2022-10-31 DIAGNOSIS — R000 Tachycardia, unspecified: Secondary | ICD-10-CM | POA: Diagnosis not present

## 2022-10-31 DIAGNOSIS — T796XXA Traumatic ischemia of muscle, initial encounter: Secondary | ICD-10-CM | POA: Diagnosis not present

## 2022-10-31 DIAGNOSIS — I480 Paroxysmal atrial fibrillation: Secondary | ICD-10-CM | POA: Diagnosis not present

## 2022-10-31 DIAGNOSIS — R410 Disorientation, unspecified: Secondary | ICD-10-CM | POA: Diagnosis not present

## 2022-11-01 DIAGNOSIS — R000 Tachycardia, unspecified: Secondary | ICD-10-CM | POA: Diagnosis not present

## 2022-11-01 DIAGNOSIS — G893 Neoplasm related pain (acute) (chronic): Secondary | ICD-10-CM | POA: Diagnosis not present

## 2022-11-01 DIAGNOSIS — R627 Adult failure to thrive: Secondary | ICD-10-CM | POA: Diagnosis not present

## 2022-11-01 DIAGNOSIS — R4182 Altered mental status, unspecified: Secondary | ICD-10-CM | POA: Diagnosis not present

## 2022-11-01 DIAGNOSIS — I10 Essential (primary) hypertension: Secondary | ICD-10-CM | POA: Diagnosis not present

## 2022-11-01 DIAGNOSIS — C519 Malignant neoplasm of vulva, unspecified: Secondary | ICD-10-CM | POA: Diagnosis not present

## 2022-11-01 DIAGNOSIS — N179 Acute kidney failure, unspecified: Secondary | ICD-10-CM | POA: Diagnosis not present

## 2022-11-10 DIAGNOSIS — Z743 Need for continuous supervision: Secondary | ICD-10-CM | POA: Diagnosis not present

## 2022-11-10 DIAGNOSIS — I10 Essential (primary) hypertension: Secondary | ICD-10-CM | POA: Diagnosis not present

## 2022-11-10 DIAGNOSIS — R0902 Hypoxemia: Secondary | ICD-10-CM | POA: Diagnosis not present

## 2022-11-24 DEATH — deceased

## 2023-01-07 IMAGING — CT CT HEAD CODE STROKE
4 series · 17 of 47 positions shown, 19 images · non-contrast
Comparison: Brain MRI from 6 days ago

CLINICAL DATA: Code stroke.  Facial droop with slurred speech



[Series 3: head wo · axial · 0.41mm/px · z∈[-134,-14]mm · 7 of 33 slices shown, 9 images]
[im 5/33  brain]
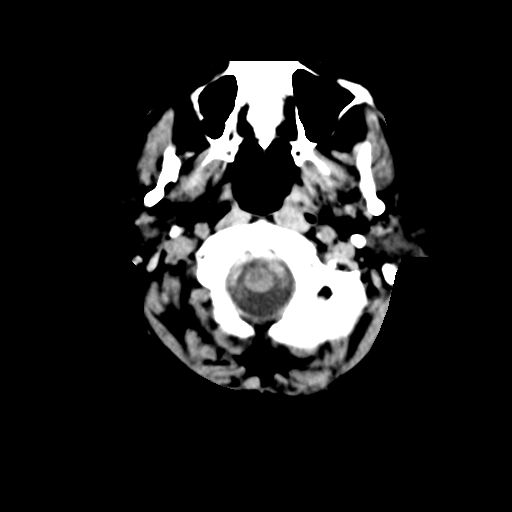
[im 5/33  bone]
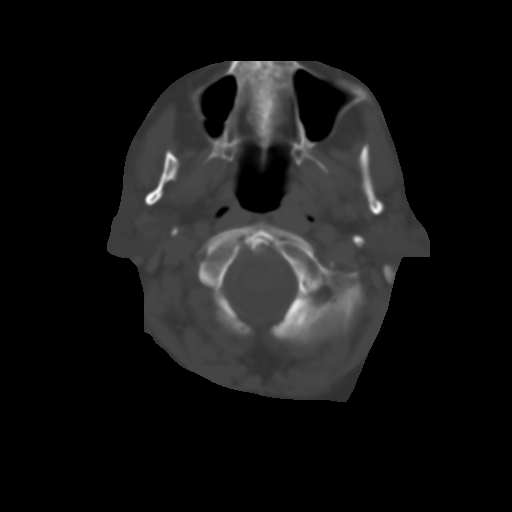
[im 9/33  brain]
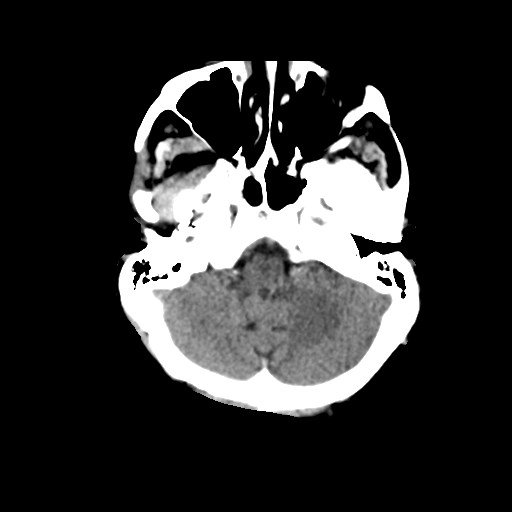
[im 13/33  brain]
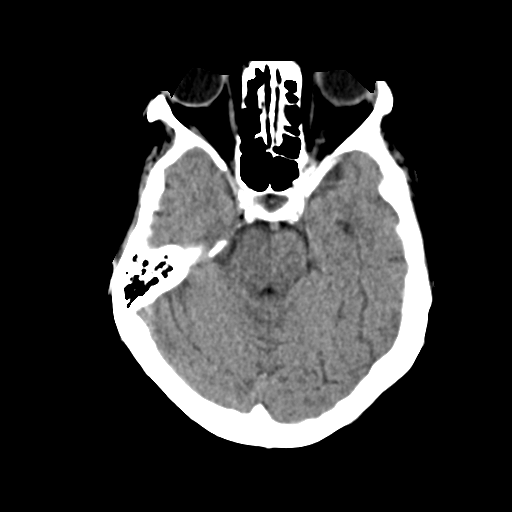
[im 17/33  brain]
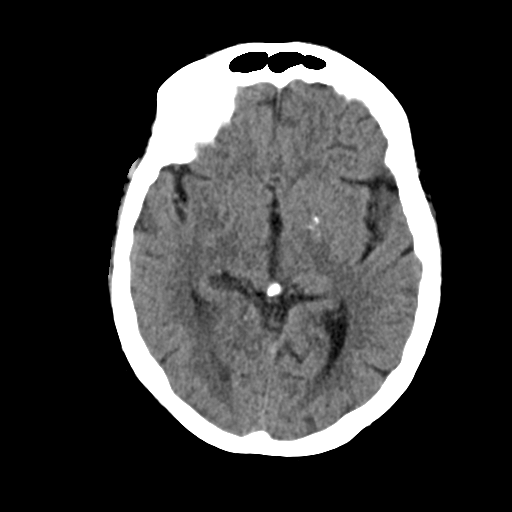
[im 21/33  brain]
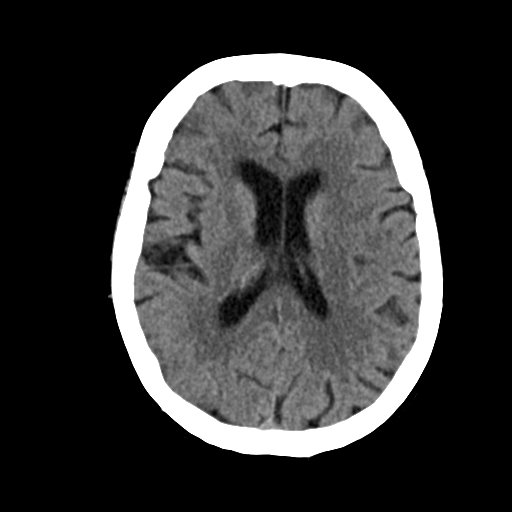
[im 21/33  bone]
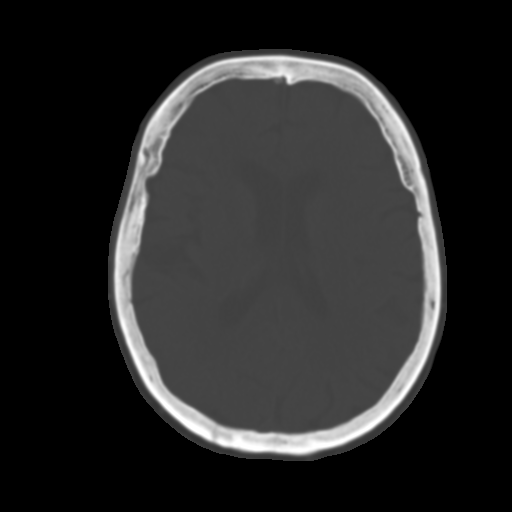
[im 25/33  brain]
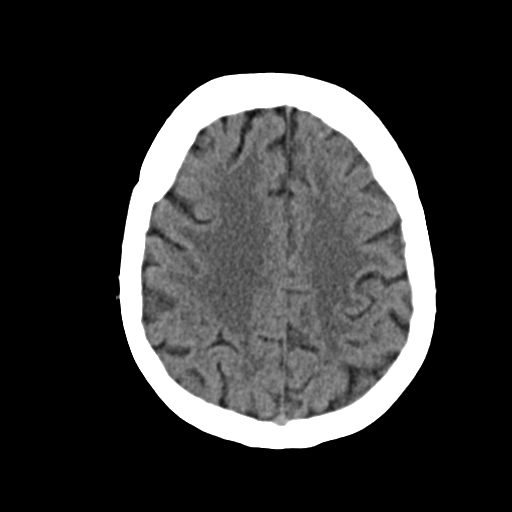
[im 29/33  brain]
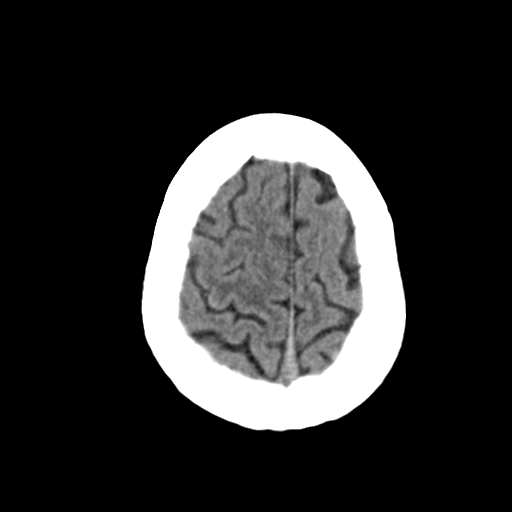

[Series 4: head bone · axial · 0.41mm/px · z∈[-138,-82]mm · 4 of 82 slices shown]
[im 9/82  bone]
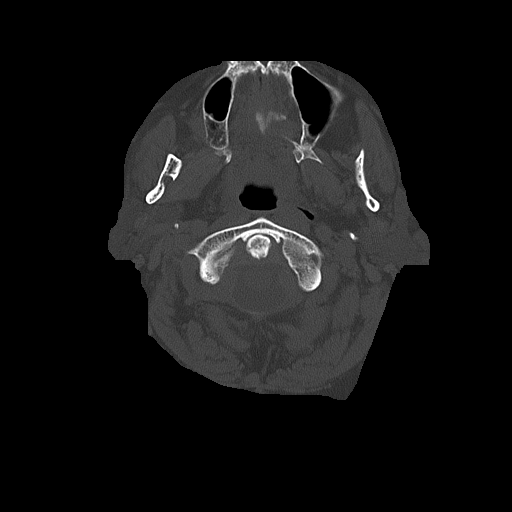
[im 17/82  bone]
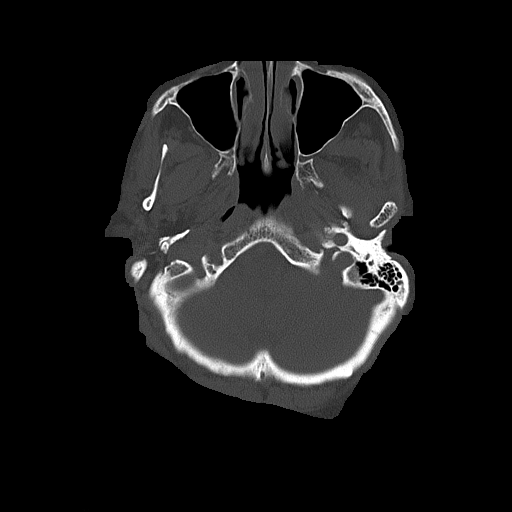
[im 25/82  bone]
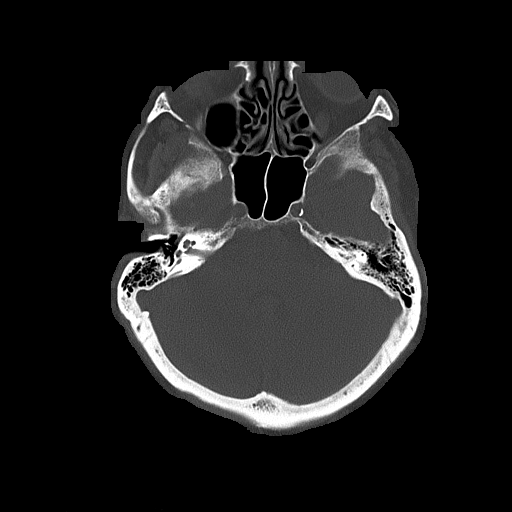
[im 37/82  bone]
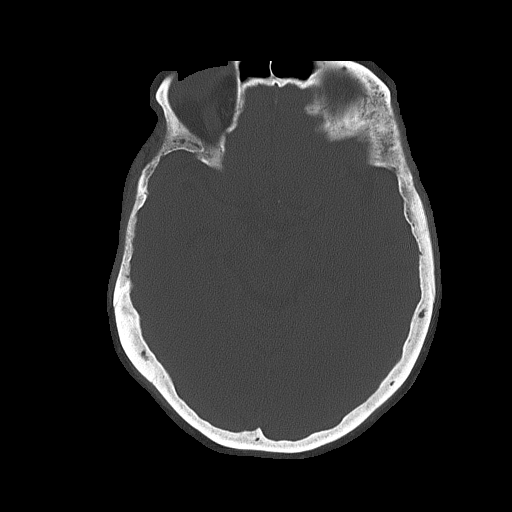

[Series 5: coronal soft tissue · coronal · 0.32mm/px · 3 of 64 slices shown]
[im 22/64  brain]
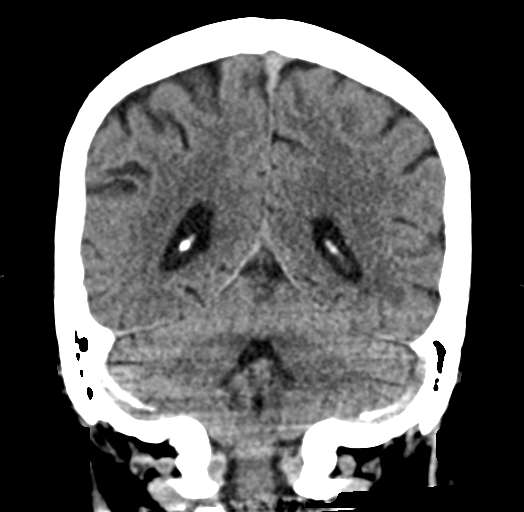
[im 29/64  brain]
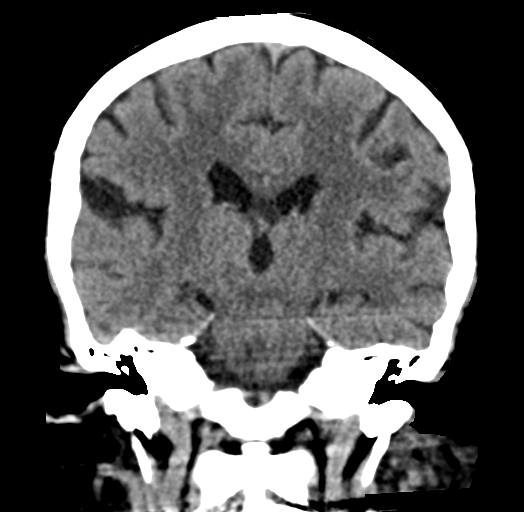
[im 36/64  brain]
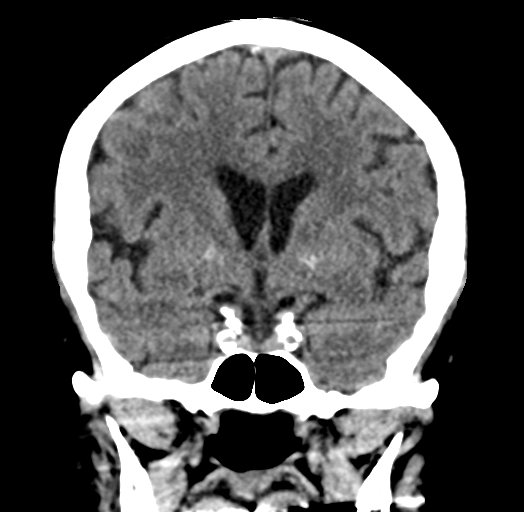

[Series 6: sagittal soft tissue · sagittal · 0.32mm/px · 3 of 56 slices shown]
[im 19/56  brain]
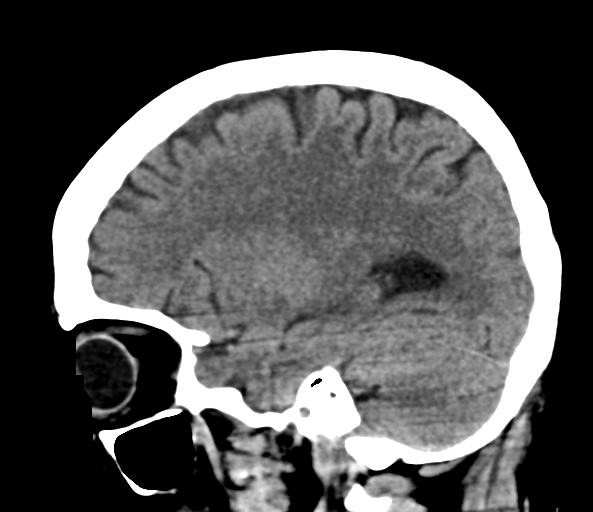
[im 28/56  brain]
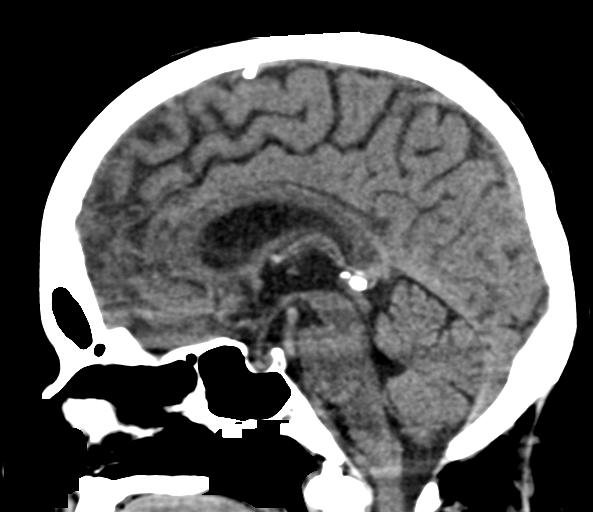
[im 37/56  brain]
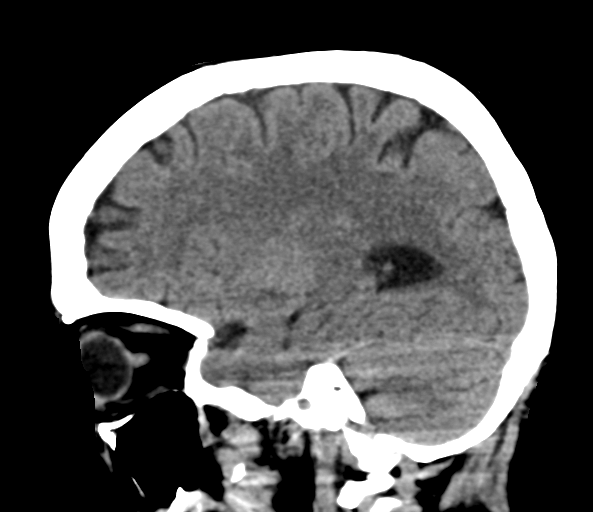

[17 of 47 positions shown; findings below may reference images not displayed]

FINDINGS: Brain: No evidence of acute infarction, hemorrhage, hydrocephalus,
extra-axial collection or mass lesion/mass effect. Small remote left
parietal cortex infarct better seen by MRI.

Vascular: No hyperdense vessel or unexpected calcification.

Skull: Normal. Negative for fracture or focal lesion.

Sinuses/Orbits: No acute finding.

Other: These results were communicated to Dr. Darko at [DATE] on
12/08/2021 by text page via the AMION messaging system.

ASPECTS (Alberta Stroke Program Early CT Score)

- Ganglionic level infarction (caudate, lentiform nuclei, internal
capsule, insula, M1-M3 cortex): 7

- Supraganglionic infarction (M4-M6 cortex): 3

Total score (0-10 with 10 being normal): 10
IMPRESSION: 1. No acute or interval finding.
2. ASPECTS is 10
